# Patient Record
Sex: Male | Born: 1955
Health system: Southern US, Community
[De-identification: ages and names within clinical notes are randomized; demographics above are authoritative.]

## PROBLEM LIST (undated history)

## (undated) DIAGNOSIS — K219 Gastro-esophageal reflux disease without esophagitis: Secondary | ICD-10-CM

## (undated) DIAGNOSIS — I6930 Unspecified sequelae of cerebral infarction: Secondary | ICD-10-CM

## (undated) DIAGNOSIS — R0602 Shortness of breath: Secondary | ICD-10-CM

## (undated) DIAGNOSIS — I161 Hypertensive emergency: Secondary | ICD-10-CM

## (undated) DIAGNOSIS — I10 Essential (primary) hypertension: Secondary | ICD-10-CM

## (undated) DIAGNOSIS — N183 Chronic kidney disease, stage 3 (moderate): Secondary | ICD-10-CM

## (undated) DIAGNOSIS — I619 Nontraumatic intracerebral hemorrhage, unspecified: Secondary | ICD-10-CM

## (undated) DIAGNOSIS — E785 Hyperlipidemia, unspecified: Secondary | ICD-10-CM

## (undated) DIAGNOSIS — I639 Cerebral infarction, unspecified: Secondary | ICD-10-CM

## (undated) DIAGNOSIS — M109 Gout, unspecified: Secondary | ICD-10-CM

## (undated) DIAGNOSIS — J811 Chronic pulmonary edema: Secondary | ICD-10-CM

## (undated) DIAGNOSIS — R5381 Other malaise: Secondary | ICD-10-CM

## (undated) DIAGNOSIS — J9690 Respiratory failure, unspecified, unspecified whether with hypoxia or hypercapnia: Secondary | ICD-10-CM

## (undated) DIAGNOSIS — E119 Type 2 diabetes mellitus without complications: Secondary | ICD-10-CM

## (undated) DIAGNOSIS — M1990 Unspecified osteoarthritis, unspecified site: Secondary | ICD-10-CM

## (undated) DIAGNOSIS — I2510 Atherosclerotic heart disease of native coronary artery without angina pectoris: Secondary | ICD-10-CM

## (undated) DIAGNOSIS — I4891 Unspecified atrial fibrillation: Secondary | ICD-10-CM

## (undated) DIAGNOSIS — I509 Heart failure, unspecified: Secondary | ICD-10-CM

## (undated) HISTORY — DX: Gout, unspecified: M10.9

## (undated) HISTORY — DX: Unspecified sequelae of cerebral infarction: I69.30

## (undated) HISTORY — DX: Type 2 diabetes mellitus without complications: E11.9

## (undated) HISTORY — DX: Gastro-esophageal reflux disease without esophagitis: K21.9

## (undated) HISTORY — DX: Essential (primary) hypertension: I10

## (undated) HISTORY — DX: Chronic kidney disease, stage 3 (moderate): N18.3

---

## 1997-10-10 ENCOUNTER — Inpatient Hospital Stay (HOSPITAL_COMMUNITY): Admission: EM | Admit: 1997-10-10 | Discharge: 1997-10-12 | Payer: Self-pay | Admitting: Emergency Medicine

## 1997-10-10 ENCOUNTER — Encounter: Payer: Self-pay | Admitting: Emergency Medicine

## 1997-10-11 ENCOUNTER — Encounter: Payer: Self-pay | Admitting: Internal Medicine

## 2000-11-10 ENCOUNTER — Emergency Department (HOSPITAL_COMMUNITY): Admission: EM | Admit: 2000-11-10 | Discharge: 2000-11-10 | Payer: Self-pay | Admitting: Emergency Medicine

## 2003-05-08 ENCOUNTER — Inpatient Hospital Stay (HOSPITAL_COMMUNITY): Admission: EM | Admit: 2003-05-08 | Discharge: 2003-05-11 | Payer: Self-pay | Admitting: Emergency Medicine

## 2003-11-07 ENCOUNTER — Ambulatory Visit: Payer: Self-pay | Admitting: Internal Medicine

## 2003-11-08 ENCOUNTER — Ambulatory Visit: Payer: Self-pay | Admitting: *Deleted

## 2003-12-10 ENCOUNTER — Ambulatory Visit: Payer: Self-pay | Admitting: Internal Medicine

## 2005-01-18 ENCOUNTER — Emergency Department (HOSPITAL_COMMUNITY): Admission: EM | Admit: 2005-01-18 | Discharge: 2005-01-18 | Payer: Self-pay | Admitting: Emergency Medicine

## 2006-01-20 ENCOUNTER — Emergency Department (HOSPITAL_COMMUNITY): Admission: EM | Admit: 2006-01-20 | Discharge: 2006-01-21 | Payer: Self-pay | Admitting: Emergency Medicine

## 2006-12-23 ENCOUNTER — Emergency Department (HOSPITAL_COMMUNITY): Admission: EM | Admit: 2006-12-23 | Discharge: 2006-12-23 | Payer: Self-pay | Admitting: Emergency Medicine

## 2010-01-26 ENCOUNTER — Emergency Department (HOSPITAL_COMMUNITY)
Admission: EM | Admit: 2010-01-26 | Discharge: 2010-01-26 | Disposition: A | Discharge status: Other Acute Inpatient Hospital | Payer: Self-pay | Source: Home / Self Care | Admitting: Family Medicine

## 2010-01-26 ENCOUNTER — Inpatient Hospital Stay (HOSPITAL_COMMUNITY)
Admission: EM | Admit: 2010-01-26 | Discharge: 2010-02-03 | Payer: Self-pay | Source: Home / Self Care | Attending: Cardiology | Admitting: Cardiology

## 2010-01-27 ENCOUNTER — Encounter (INDEPENDENT_AMBULATORY_CARE_PROVIDER_SITE_OTHER): Payer: Self-pay | Admitting: Cardiology

## 2010-01-27 LAB — URINALYSIS, ROUTINE W REFLEX MICROSCOPIC
Bilirubin Urine: NEGATIVE
Ketones, ur: NEGATIVE mg/dL
Leukocytes, UA: NEGATIVE
Nitrite: NEGATIVE
Protein, ur: >300 mg/dL — AB
Specific Gravity, Urine: 1.019 (ref 1.005–1.030)
Urine Glucose, Fasting: 100 mg/dL — AB
Urobilinogen, UA: 1 mg/dL (ref 0.0–1.0)
pH: 6 (ref 5.0–8.0)

## 2010-01-27 LAB — CARDIAC PANEL(CRET KIN+CKTOT+MB+TROPI)
CK, MB: 2.7 ng/mL (ref 0.3–4.0)
Relative Index: 2.3 (ref 0.0–2.5)
Total CK: 117 U/L (ref 7–232)
Troponin I: 0.23 ng/mL — ABNORMAL HIGH (ref 0.00–0.06)

## 2010-01-27 LAB — COMPREHENSIVE METABOLIC PANEL
ALT: 33 U/L (ref 0–53)
AST: 32 U/L (ref 0–37)
Albumin: 3.6 g/dL (ref 3.5–5.2)
Alkaline Phosphatase: 68 U/L (ref 39–117)
BUN: 14 mg/dL (ref 6–23)
CO2: 28 mEq/L (ref 19–32)
Calcium: 9.2 mg/dL (ref 8.4–10.5)
Chloride: 96 mEq/L (ref 96–112)
Creatinine, Ser: 1.32 mg/dL (ref 0.4–1.5)
GFR calc Af Amer: >60 mL/min (ref >60)
GFR calc non Af Amer: 56 mL/min — ABNORMAL LOW (ref >60)
Glucose, Bld: 229 mg/dL — ABNORMAL HIGH (ref 70–99)
Potassium: 3 mEq/L — ABNORMAL LOW (ref 3.5–5.1)
Sodium: 136 mEq/L (ref 135–145)
Total Bilirubin: 0.7 mg/dL (ref 0.3–1.2)
Total Protein: 7.2 g/dL (ref 6.0–8.3)

## 2010-01-27 LAB — DIFFERENTIAL
Basophils Absolute: 0.1 10*3/uL (ref 0.0–0.1)
Basophils Relative: 1 % (ref 0–1)
Eosinophils Absolute: 0.2 10*3/uL (ref 0.0–0.7)
Eosinophils Relative: 2 % (ref 0–5)
Lymphocytes Relative: 36 % (ref 12–46)
Lymphs Abs: 2.6 10*3/uL (ref 0.7–4.0)
Monocytes Absolute: 0.6 10*3/uL (ref 0.1–1.0)
Monocytes Relative: 8 % (ref 3–12)
Neutro Abs: 3.8 10*3/uL (ref 1.7–7.7)
Neutrophils Relative %: 53 % (ref 43–77)

## 2010-01-27 LAB — CBC
HCT: 43.3 % (ref 39.0–52.0)
Hemoglobin: 14.6 g/dL (ref 13.0–17.0)
MCH: 29.7 pg (ref 26.0–34.0)
MCHC: 33.7 g/dL (ref 30.0–36.0)
MCV: 88 fL (ref 78.0–100.0)
Platelets: 316 10*3/uL (ref 150–400)
RBC: 4.92 MIL/uL (ref 4.22–5.81)
RDW: 13.8 % (ref 11.5–15.5)
WBC: 7.3 10*3/uL (ref 4.0–10.5)

## 2010-01-27 LAB — CK TOTAL AND CKMB (NOT AT ARMC)
CK, MB: 3.6 ng/mL (ref 0.3–4.0)
Relative Index: 2.5 (ref 0.0–2.5)
Total CK: 143 U/L (ref 7–232)

## 2010-01-27 LAB — BRAIN NATRIURETIC PEPTIDE: Pro B Natriuretic peptide (BNP): 149 pg/mL — ABNORMAL HIGH (ref 0.0–100.0)

## 2010-01-27 LAB — MRSA PCR SCREENING: MRSA by PCR: NEGATIVE

## 2010-01-27 LAB — HEPARIN LEVEL (UNFRACTIONATED): Heparin Unfractionated: <0.1 IU/mL — ABNORMAL LOW (ref 0.30–0.70)

## 2010-01-27 LAB — GLUCOSE, CAPILLARY: Glucose-Capillary: 300 mg/dL — ABNORMAL HIGH (ref 70–99)

## 2010-01-27 LAB — URINE MICROSCOPIC-ADD ON

## 2010-01-27 LAB — PROTIME-INR
INR: 0.99 (ref 0.00–1.49)
Prothrombin Time: 13.3 seconds (ref 11.6–15.2)

## 2010-01-27 LAB — TROPONIN I: Troponin I: 0.15 ng/mL — ABNORMAL HIGH (ref 0.00–0.06)

## 2010-01-29 LAB — BASIC METABOLIC PANEL
BUN: 26 mg/dL — ABNORMAL HIGH (ref 6–23)
CO2: 24 mEq/L (ref 19–32)
Calcium: 9.3 mg/dL (ref 8.4–10.5)
Chloride: 102 mEq/L (ref 96–112)
Creatinine, Ser: 1.92 mg/dL — ABNORMAL HIGH (ref 0.4–1.5)
GFR calc Af Amer: 44 mL/min — ABNORMAL LOW (ref >60)
GFR calc non Af Amer: 37 mL/min — ABNORMAL LOW (ref >60)
Glucose, Bld: 232 mg/dL — ABNORMAL HIGH (ref 70–99)
Potassium: 3.3 mEq/L — ABNORMAL LOW (ref 3.5–5.1)
Sodium: 137 mEq/L (ref 135–145)

## 2010-01-29 LAB — GLUCOSE, CAPILLARY
Glucose-Capillary: 140 mg/dL — ABNORMAL HIGH (ref 70–99)
Glucose-Capillary: 218 mg/dL — ABNORMAL HIGH (ref 70–99)
Glucose-Capillary: 275 mg/dL — ABNORMAL HIGH (ref 70–99)
Glucose-Capillary: 197 mg/dL — ABNORMAL HIGH (ref 70–99)
Glucose-Capillary: 196 mg/dL — ABNORMAL HIGH (ref 70–99)
Glucose-Capillary: 236 mg/dL — ABNORMAL HIGH (ref 70–99)
Glucose-Capillary: 232 mg/dL — ABNORMAL HIGH (ref 70–99)
Glucose-Capillary: 186 mg/dL — ABNORMAL HIGH (ref 70–99)

## 2010-01-29 LAB — LIPID PANEL
Cholesterol: 166 mg/dL (ref 0–200)
HDL: 49 mg/dL (ref >39)
LDL Cholesterol: 86 mg/dL (ref 0–99)
Total CHOL/HDL Ratio: 3.4 RATIO
Triglycerides: 156 mg/dL — ABNORMAL HIGH (ref <150)
VLDL: 31 mg/dL (ref 0–40)

## 2010-01-29 LAB — PROTIME-INR
INR: 1.12 (ref 0.00–1.49)
INR: 1.2 (ref 0.00–1.49)
Prothrombin Time: 14.6 seconds (ref 11.6–15.2)
Prothrombin Time: 15.4 seconds — ABNORMAL HIGH (ref 11.6–15.2)

## 2010-01-29 LAB — CBC
HCT: 36.6 % — ABNORMAL LOW (ref 39.0–52.0)
HCT: 34.2 % — ABNORMAL LOW (ref 39.0–52.0)
Hemoglobin: 11.9 g/dL — ABNORMAL LOW (ref 13.0–17.0)
Hemoglobin: 11.3 g/dL — ABNORMAL LOW (ref 13.0–17.0)
MCH: 28.5 pg (ref 26.0–34.0)
MCH: 28.8 pg (ref 26.0–34.0)
MCHC: 32.5 g/dL (ref 30.0–36.0)
MCHC: 33 g/dL (ref 30.0–36.0)
MCV: 87.6 fL (ref 78.0–100.0)
MCV: 87 fL (ref 78.0–100.0)
Platelets: 301 10*3/uL (ref 150–400)
Platelets: 288 10*3/uL (ref 150–400)
RBC: 4.18 MIL/uL — ABNORMAL LOW (ref 4.22–5.81)
RBC: 3.93 MIL/uL — ABNORMAL LOW (ref 4.22–5.81)
RDW: 14.1 % (ref 11.5–15.5)
RDW: 13.8 % (ref 11.5–15.5)
WBC: 8.1 10*3/uL (ref 4.0–10.5)
WBC: 9.5 10*3/uL (ref 4.0–10.5)

## 2010-01-29 LAB — CARDIAC PANEL(CRET KIN+CKTOT+MB+TROPI)
CK, MB: 2.3 ng/mL (ref 0.3–4.0)
CK, MB: 2.3 ng/mL (ref 0.3–4.0)
Relative Index: INVALID (ref 0.0–2.5)
Relative Index: 2.1 (ref 0.0–2.5)
Total CK: 95 U/L (ref 7–232)
Total CK: 109 U/L (ref 7–232)
Troponin I: 0.15 ng/mL — ABNORMAL HIGH (ref 0.00–0.06)
Troponin I: 0.09 ng/mL — ABNORMAL HIGH (ref 0.00–0.06)

## 2010-01-29 LAB — HEPARIN LEVEL (UNFRACTIONATED)
Heparin Unfractionated: 0.38 IU/mL (ref 0.30–0.70)
Heparin Unfractionated: 0.31 IU/mL (ref 0.30–0.70)

## 2010-01-29 LAB — BRAIN NATRIURETIC PEPTIDE: Pro B Natriuretic peptide (BNP): 89 pg/mL (ref 0.0–100.0)

## 2010-01-29 LAB — HEMOGLOBIN A1C
Hgb A1c MFr Bld: 8.9 % — ABNORMAL HIGH (ref <5.7)
Mean Plasma Glucose: 209 mg/dL — ABNORMAL HIGH (ref <117)

## 2010-01-29 LAB — APTT: aPTT: 52 seconds — ABNORMAL HIGH (ref 24–37)

## 2010-01-29 LAB — TSH: TSH: 2.879 u[IU]/mL (ref 0.350–4.500)

## 2010-02-03 LAB — BASIC METABOLIC PANEL
BUN: 29 mg/dL — ABNORMAL HIGH (ref 6–23)
CO2: 24 mEq/L (ref 19–32)
CO2: 26 mEq/L (ref 19–32)
Calcium: 9.4 mg/dL (ref 8.4–10.5)
Chloride: 101 mEq/L (ref 96–112)
Chloride: 104 mEq/L (ref 96–112)
Creatinine, Ser: 2.18 mg/dL — ABNORMAL HIGH (ref 0.4–1.5)
Creatinine, Ser: 1.58 mg/dL — ABNORMAL HIGH (ref 0.4–1.5)
GFR calc Af Amer: 38 mL/min — ABNORMAL LOW (ref >60)
GFR calc Af Amer: 55 mL/min — ABNORMAL LOW (ref >60)
GFR calc non Af Amer: 32 mL/min — ABNORMAL LOW (ref >60)
Glucose, Bld: 159 mg/dL — ABNORMAL HIGH (ref 70–99)
Potassium: 3.7 mEq/L (ref 3.5–5.1)
Potassium: 3.7 mEq/L (ref 3.5–5.1)
Sodium: 137 mEq/L (ref 135–145)

## 2010-02-03 LAB — CBC
HCT: 35.3 % — ABNORMAL LOW (ref 39.0–52.0)
HCT: 35.8 % — ABNORMAL LOW (ref 39.0–52.0)
Hemoglobin: 11.8 g/dL — ABNORMAL LOW (ref 13.0–17.0)
Hemoglobin: 11.8 g/dL — ABNORMAL LOW (ref 13.0–17.0)
Hemoglobin: 11.5 g/dL — ABNORMAL LOW (ref 13.0–17.0)
MCH: 29.2 pg (ref 26.0–34.0)
MCH: 28.7 pg (ref 26.0–34.0)
MCH: 29.5 pg (ref 26.0–34.0)
MCHC: 33.4 g/dL (ref 30.0–36.0)
MCHC: 33 g/dL (ref 30.0–36.0)
MCV: 87.4 fL (ref 78.0–100.0)
MCV: 87.1 fL (ref 78.0–100.0)
Platelets: 267 10*3/uL (ref 150–400)
Platelets: 259 10*3/uL (ref 150–400)
Platelets: 262 10*3/uL (ref 150–400)
RBC: 4.04 MIL/uL — ABNORMAL LOW (ref 4.22–5.81)
RBC: 4.11 MIL/uL — ABNORMAL LOW (ref 4.22–5.81)
RBC: 3.9 MIL/uL — ABNORMAL LOW (ref 4.22–5.81)
RDW: 14.1 % (ref 11.5–15.5)
RDW: 14.1 % (ref 11.5–15.5)
WBC: 8.9 10*3/uL (ref 4.0–10.5)
WBC: 7 10*3/uL (ref 4.0–10.5)
WBC: 5.8 10*3/uL (ref 4.0–10.5)

## 2010-02-03 LAB — GLUCOSE, CAPILLARY
Glucose-Capillary: 179 mg/dL — ABNORMAL HIGH (ref 70–99)
Glucose-Capillary: 165 mg/dL — ABNORMAL HIGH (ref 70–99)
Glucose-Capillary: 188 mg/dL — ABNORMAL HIGH (ref 70–99)
Glucose-Capillary: 214 mg/dL — ABNORMAL HIGH (ref 70–99)
Glucose-Capillary: 176 mg/dL — ABNORMAL HIGH (ref 70–99)
Glucose-Capillary: 212 mg/dL — ABNORMAL HIGH (ref 70–99)
Glucose-Capillary: 224 mg/dL — ABNORMAL HIGH (ref 70–99)
Glucose-Capillary: 156 mg/dL — ABNORMAL HIGH (ref 70–99)
Glucose-Capillary: 210 mg/dL — ABNORMAL HIGH (ref 70–99)

## 2010-02-03 LAB — BRAIN NATRIURETIC PEPTIDE: Pro B Natriuretic peptide (BNP): 363 pg/mL — ABNORMAL HIGH (ref 0.0–100.0)

## 2010-02-03 LAB — PROTIME-INR
INR: 1.3 (ref 0.00–1.49)
INR: 1.48 (ref 0.00–1.49)
INR: 1.88 — ABNORMAL HIGH (ref 0.00–1.49)
Prothrombin Time: 16.4 seconds — ABNORMAL HIGH (ref 11.6–15.2)
Prothrombin Time: 18.1 seconds — ABNORMAL HIGH (ref 11.6–15.2)
Prothrombin Time: 21.8 seconds — ABNORMAL HIGH (ref 11.6–15.2)

## 2010-02-03 LAB — HEPARIN LEVEL (UNFRACTIONATED)
Heparin Unfractionated: 0.2 IU/mL — ABNORMAL LOW (ref 0.30–0.70)
Heparin Unfractionated: 0.48 IU/mL (ref 0.30–0.70)
Heparin Unfractionated: 0.27 IU/mL — ABNORMAL LOW (ref 0.30–0.70)
Heparin Unfractionated: 0.38 IU/mL (ref 0.30–0.70)

## 2010-02-04 LAB — BASIC METABOLIC PANEL
BUN: 24 mg/dL — ABNORMAL HIGH (ref 6–23)
BUN: 21 mg/dL (ref 6–23)
CO2: 28 mEq/L (ref 19–32)
Calcium: 9.4 mg/dL (ref 8.4–10.5)
Calcium: 9.9 mg/dL (ref 8.4–10.5)
Chloride: 105 mEq/L (ref 96–112)
Chloride: 98 mEq/L (ref 96–112)
Creatinine, Ser: 1.57 mg/dL — ABNORMAL HIGH (ref 0.4–1.5)
GFR calc Af Amer: 56 mL/min — ABNORMAL LOW (ref >60)
GFR calc Af Amer: 54 mL/min — ABNORMAL LOW (ref >60)
GFR calc Af Amer: 51 mL/min — ABNORMAL LOW (ref >60)
GFR calc non Af Amer: 42 mL/min — ABNORMAL LOW (ref >60)
Potassium: 4.5 mEq/L (ref 3.5–5.1)
Sodium: 136 mEq/L (ref 135–145)

## 2010-02-04 LAB — GLUCOSE, CAPILLARY
Glucose-Capillary: 158 mg/dL — ABNORMAL HIGH (ref 70–99)
Glucose-Capillary: 259 mg/dL — ABNORMAL HIGH (ref 70–99)
Glucose-Capillary: 153 mg/dL — ABNORMAL HIGH (ref 70–99)
Glucose-Capillary: 194 mg/dL — ABNORMAL HIGH (ref 70–99)

## 2010-02-04 LAB — CBC
Hemoglobin: 12.1 g/dL — ABNORMAL LOW (ref 13.0–17.0)
MCH: 28.5 pg (ref 26.0–34.0)
MCV: 87 fL (ref 78.0–100.0)
MCV: 87.2 fL (ref 78.0–100.0)
Platelets: 278 10*3/uL (ref 150–400)
Platelets: 320 10*3/uL (ref 150–400)
Platelets: 339 10*3/uL (ref 150–400)
RBC: 4 MIL/uL — ABNORMAL LOW (ref 4.22–5.81)
RBC: 4.24 MIL/uL (ref 4.22–5.81)
RBC: 4.44 MIL/uL (ref 4.22–5.81)
RDW: 14 % (ref 11.5–15.5)
WBC: 6 10*3/uL (ref 4.0–10.5)
WBC: 6.4 10*3/uL (ref 4.0–10.5)
WBC: 5.5 10*3/uL (ref 4.0–10.5)

## 2010-02-04 LAB — PROTIME-INR
INR: 2.11 — ABNORMAL HIGH (ref 0.00–1.49)
Prothrombin Time: 23.8 seconds — ABNORMAL HIGH (ref 11.6–15.2)
Prothrombin Time: 29.9 seconds — ABNORMAL HIGH (ref 11.6–15.2)

## 2010-02-04 LAB — BRAIN NATRIURETIC PEPTIDE: Pro B Natriuretic peptide (BNP): 363 pg/mL — ABNORMAL HIGH (ref 0.0–100.0)

## 2010-02-04 LAB — HEPARIN LEVEL (UNFRACTIONATED)
Heparin Unfractionated: 0.26 IU/mL — ABNORMAL LOW (ref 0.30–0.70)
Heparin Unfractionated: 0.18 IU/mL — ABNORMAL LOW (ref 0.30–0.70)

## 2010-02-26 NOTE — Discharge Summary (Signed)
NAMEWILLIOM, [name]                ACCOUNT NO.:  [id number]  MEDICAL RECORD NO.:  [id number]          PATIENT TYPE:  INP  LOCATION:  2004                         FACILITY:  [facility]  PHYSICIAN:  Eduardo Osier. [name], M.D. DATE OF BIRTH:  Nov 24, 1955  DATE OF ADMISSION:  01/26/2010 DATE OF DISCHARGE:  02/03/2010                              DISCHARGE SUMMARY   ADMITTING DIAGNOSES: 1. Chest pain, rule out myocardial infarction. 2. New onset atrial fibrillation with rapid ventricular response. 3. Decompensated systolic heart failure. 4. Hypertensive emergency. 5. Noninsulin-dependent diabetes mellitus. 6. Hypokalemia. 7. Alcohol abuse.  FINAL DIAGNOSES: 1. Compensated systolic heart failure. 2. Status post hypertensive emergency. 3. Hypertensive heart disease with systolic dysfunction. 4. Chronic atrial fibrillation. 5. Hypertension. 6. Diabetes mellitus. 7. EtOH abuse. 8. Status post hypokalemia. 9. Mild renal insufficiency.  DISCHARGE MEDICATIONS: 1. Enteric-coated aspirin 81 mg 1 tablet daily. 2. Carvedilol 25 mg 1 tablet twice daily. 3. BiDil 1 tablet 3 times daily. 4. Ramipril 5 mg 1 tablet twice daily. 5. Aldactone 50 mg 1 tablet twice daily. 6. Clonidine 0.1 mg 1 tablet twice daily. 7. Amlodipine 5 mg 1 tablet daily. 8. Atorvastatin 20 mg 1 tablet daily. 9. Multivitamin 1 tablet daily as before. 10.Amaryl 2 mg 1 tablet twice daily. 11.Coumadin 6 mg 1 tablet every evening.  DIET:  Low salt, low cholesterol, 1800 calories ADA diet.  The patient has been advised to monitor blood pressure and blood sugar daily.  We will check BNP and PT/INR in 1 week.  The patient has been given instructions for CHF.  Follow up with me in 1 week.  CONDITION AT DISCHARGE:  Stable.  BRIEF HISTORY AND HOSPITAL COURSE:  [name] is a 55 year old black male with past medical history significant for hypertension, noninsulin- dependent diabetes mellitus, alcohol abuse, and history of  tobacco abuse.  He came to the ER via EMS from urgent care because of progressive increasing shortness of breath associated with chest heaviness and leg swelling for 1 week.  Denies any nausea, vomiting, diaphoresis.  Denies palpitation, lightheadedness, or syncope.  The patient states he is noncompliant to medication and has been drinking 4 ounces of hard liquor three to four times per week and occasionally beer.  The patient was noted to have blood pressure of 204/144 with AFib with RVR, and was noted to be in congestive heart failure and was transferred from urgent care to [facility] for further management.  The patient denies any thyroid problems.  PAST MEDICAL HISTORY:  As above.  PAST SURGICAL HISTORY:  He had appendectomy in the past.  ALLERGIES:  No known drug allergies.  MEDICATION AT HOME:  He was on Exforge, clonidine, Lasix, and metformin.  SOCIAL HISTORY:  He is single.  No children.  Smoked one-pack per week for 15 years.  Quit more than 7 years ago.  Drinks hard liquor 4 ounces three to four times per week for 20+ years and beer three to four times per week.  Worked in [employer] in the past, presently unemployed.  FAMILY HISTORY:  Father died of MI.  Mother died of stroke.  She was also hypertensive.  He has seven brothers and two sisters.  Most of the brothers are diabetic and hypertensive.  PHYSICAL EXAMINATION:  GENERAL:  He was alert, awake, and oriented x3 in no acute distress. VITAL SIGNS:  Blood pressure was 180/111, pulse was 113 irregularly irregular. HEENT:  Conjunctivae was pink. NECK:  Supple.  Positive JVD.  He had bibasilar rales. CARDIOVASCULAR:  Irregularly irregular.  S1 and S2 was normal.  There was soft systolic murmur and S3 gallop. ABDOMEN:  Soft.  Bowel sounds were present, nontender. EXTREMITIES:  There is no clubbing or cyanosis.  There was 2+ edema.  LABORATORY DATA:  His hemoglobin was 14.6, hematocrit 43.3, white count of 7.3.   Sodium was 136, potassium 3.0, BUN 14, creatinine 1.32, glucose 229.  His BNP was 149.  CK was 117, MB 2.7, troponin I was 0.23, repeat troponin I was 0.15.  His hemoglobin A1c was 8.9.  TSH was 2.87, BNP on January 28, 2010 was 89.  PT is 29.9 and INR of 2.84.  His sodium is 136, potassium 4.0, BUN 27, creatinine 1.70, glucose is 217.  Two-D echo done on January 27, 2010 showed mild concentric hypertrophy, systolic function was severely reduced with EF of 25-30%.  There was mild AI and MR and moderate tricuspid regurgitation.  Chest x-ray done showed congestive heart failure with pulmonary edema.  Repeat x-ray showed cardiomegaly, persistent mild interstitial edema without change in aeration.  BRIEF HOSPITAL COURSE:  The patient was admitted to Step-Down Unit.  The patient was started on IV Lasix and heparin and nitrates with good diuresis.  Discussed at length with the patient regarding TEE-assisted cardioversion versus medical treatment and also regarding stress test, the patient wanted to be treated medically only and refused for TEE. The patient was started also on Coumadin per pharmacy protocol.  The patient did not have any episodes of chest pain during the hospital stay.  His INR is in the therapeutic range.  His heart rate is fairly well controlled.  The patient has been advised to take his medication regularly and if he gains 2-3 pounds in 1 day or 2 pounds overnight or experiences progressive shortness of breath or swelling in feet or hands, he should call my office immediately.  The patient will be monitored in my office in 1 week.  We will check his PT/INR in 1 week.     Eduardo Osier. [name], M.D.     MNH/MEDQ  D:  02/03/2010  T:  02/04/2010  Job:  [id number]  Electronically Signed by Rinaldo Cloud M.D. on 02/26/2010 10:42:58 PM

## 2010-05-30 NOTE — Discharge Summary (Signed)
NAME:  [name], [name] NO.:  [id number]   MEDICAL RECORD NO.:  [id number]                   PATIENT TYPE:  INP   LOCATION:  1610                                 FACILITY[facility]  [facility]   PHYSICIAN:  Sherin Quarry, MD                   DATE OF BIRTH:  1955/02/03   DATE OF ADMISSION:  05/08/2003  DATE OF DISCHARGE:                                 DISCHARGE SUMMARY   HISTORY:  [name] is a 55 year old man who is employed at [employer].  [name] indicated that he had a long standing history of  hypertension and that he had been unable to obtain any medications for the  last six months as he had been unemployed until recently.  He presented on  April 26 with acute onset of headache with associated dizziness and nausea  and vomiting.   PHYSICAL EXAMINATION:  VITAL SIGNS:  On presentation, he was seen by Dr.  Suanne Marker who noted that his blood pressure was 220/120, pulse 71, respirations  18, temperature 98.3, O2 saturation was 100%.  HEENT:  Within normal limits.  CHEST:  Clear to auscultation.  There was no wheezing or rhonchi.  CARDIOVASCULAR:  Revealed normal S1 and S2 without murmurs, rubs or gallops.  ABDOMEN:  Within normal limits.  There were no masses or tenderness, no  guarding or rebound.  NEUROLOGIC:  Within normal limits.  EXTREMITIES:  Revealed no evidence of cyanosis or edema.   A CT scan of the brain was obtained which was negative for infarct or  hemorrhage.  Relevant laboratories studies obtained included a sodium of  138, potassium 2.7, chloride 105, bicarbonate 26, creatinine 1.4, BUN 18.  The initial glucose was 225, this came down to the normal range during the  course of the patient's hospitalization.  CBC revealed a white count of  7300, hemoglobin 14.6.  Serial cardiac enzymes were negative.  A lipid  profile was obtained which showed an LDL cholesterol of 71, HDL of 84.  On  admission, the patient received two  injections of IV labetalol and then was  placed on a labetalol infusion at 2 mg per minute.  Subsequently, he was  started on labetalol 200 mg b.i.d., lisinopril 10 mg b.i.d. and Norvasc 10  mg daily.  On this regimen, blood pressure slowly improved, the labetalol  infusion was discontinued.  Labetalol was subsequently increased to 400 mg  b.i.d.  By April 29, the patient was alert, he was no longer having headache  or dizziness, blood pressure was still in the range of 170/100 to 110;  however, it was felt reasonable to discharge the patient at that time.   DISCHARGE DIAGNOSES:  1. Hypertensive urgency secondary to noncompliance with medications.  2. Hypokalemia.  3. History of alcohol abuse.   DISCHARGE MEDICATIONS:  1. Zestril 10 mg b.i.d.  2. Norvasc 10 mg  daily.  3. Labetalol 400 mg b.i.d.  4. The patient was also advised to take aspirin 81 mg daily.   I emphasized to the patient the importance of taking his medications on a  regular basis.  I also emphasized that control of his blood pressure would  require frequent visits with a physician to adjust his medications.  He is  going to plan to followup with Leanne Chang, M.D. and I advised him to  return to his office in about seven days.   CONDITION ON DISCHARGE:  Good.                                               Sherin Quarry, MD    SY/MEDQ  D:  05/11/2003  T:  05/11/2003  Job:  [id number]   cc:   Leanne Chang, M.D.  [address]  [place]  [place] [address]  Fax: [phone]

## 2010-11-19 ENCOUNTER — Inpatient Hospital Stay (HOSPITAL_COMMUNITY)
Admission: EM | Admit: 2010-11-19 | Discharge: 2010-12-22 | DRG: 003 | Disposition: A | Discharge status: Rehabilitation Facility | Payer: 59 | Source: Ambulatory Visit | Attending: Internal Medicine | Admitting: Internal Medicine

## 2010-11-19 ENCOUNTER — Emergency Department (HOSPITAL_COMMUNITY): Payer: 59

## 2010-11-19 ENCOUNTER — Encounter: Payer: Self-pay | Admitting: *Deleted

## 2010-11-19 ENCOUNTER — Other Ambulatory Visit: Payer: Self-pay

## 2010-11-19 DIAGNOSIS — I2510 Atherosclerotic heart disease of native coronary artery without angina pectoris: Secondary | ICD-10-CM | POA: Diagnosis present

## 2010-11-19 DIAGNOSIS — G9340 Encephalopathy, unspecified: Secondary | ICD-10-CM | POA: Diagnosis present

## 2010-11-19 DIAGNOSIS — Y833 Surgical operation with formation of external stoma as the cause of abnormal reaction of the patient, or of later complication, without mention of misadventure at the time of the procedure: Secondary | ICD-10-CM | POA: Diagnosis not present

## 2010-11-19 DIAGNOSIS — J9600 Acute respiratory failure, unspecified whether with hypoxia or hypercapnia: Secondary | ICD-10-CM | POA: Diagnosis not present

## 2010-11-19 DIAGNOSIS — I5023 Acute on chronic systolic (congestive) heart failure: Secondary | ICD-10-CM | POA: Diagnosis present

## 2010-11-19 DIAGNOSIS — J189 Pneumonia, unspecified organism: Secondary | ICD-10-CM | POA: Diagnosis not present

## 2010-11-19 DIAGNOSIS — G911 Obstructive hydrocephalus: Secondary | ICD-10-CM | POA: Diagnosis present

## 2010-11-19 DIAGNOSIS — E785 Hyperlipidemia, unspecified: Secondary | ICD-10-CM | POA: Diagnosis present

## 2010-11-19 DIAGNOSIS — I10 Essential (primary) hypertension: Secondary | ICD-10-CM

## 2010-11-19 DIAGNOSIS — E069 Thyroiditis, unspecified: Secondary | ICD-10-CM | POA: Diagnosis present

## 2010-11-19 DIAGNOSIS — R0902 Hypoxemia: Secondary | ICD-10-CM

## 2010-11-19 DIAGNOSIS — I4821 Permanent atrial fibrillation: Secondary | ICD-10-CM | POA: Diagnosis present

## 2010-11-19 DIAGNOSIS — G450 Vertebro-basilar artery syndrome: Secondary | ICD-10-CM | POA: Diagnosis present

## 2010-11-19 DIAGNOSIS — I619 Nontraumatic intracerebral hemorrhage, unspecified: Secondary | ICD-10-CM | POA: Diagnosis not present

## 2010-11-19 DIAGNOSIS — J9690 Respiratory failure, unspecified, unspecified whether with hypoxia or hypercapnia: Secondary | ICD-10-CM

## 2010-11-19 DIAGNOSIS — R4789 Other speech disturbances: Secondary | ICD-10-CM | POA: Diagnosis present

## 2010-11-19 DIAGNOSIS — Z8673 Personal history of transient ischemic attack (TIA), and cerebral infarction without residual deficits: Secondary | ICD-10-CM | POA: Diagnosis present

## 2010-11-19 DIAGNOSIS — J811 Chronic pulmonary edema: Secondary | ICD-10-CM

## 2010-11-19 DIAGNOSIS — G459 Transient cerebral ischemic attack, unspecified: Secondary | ICD-10-CM

## 2010-11-19 DIAGNOSIS — Y9210 Unspecified residential institution as the place of occurrence of the external cause: Secondary | ICD-10-CM | POA: Diagnosis not present

## 2010-11-19 DIAGNOSIS — E119 Type 2 diabetes mellitus without complications: Secondary | ICD-10-CM

## 2010-11-19 DIAGNOSIS — R4020 Unspecified coma: Secondary | ICD-10-CM | POA: Diagnosis present

## 2010-11-19 DIAGNOSIS — K921 Melena: Secondary | ICD-10-CM | POA: Diagnosis not present

## 2010-11-19 DIAGNOSIS — Z87891 Personal history of nicotine dependence: Secondary | ICD-10-CM

## 2010-11-19 DIAGNOSIS — I110 Hypertensive heart disease with heart failure: Secondary | ICD-10-CM | POA: Diagnosis present

## 2010-11-19 DIAGNOSIS — E876 Hypokalemia: Secondary | ICD-10-CM | POA: Diagnosis not present

## 2010-11-19 DIAGNOSIS — I6340 Cerebral infarction due to embolism of unspecified cerebral artery: Principal | ICD-10-CM | POA: Diagnosis present

## 2010-11-19 DIAGNOSIS — J9509 Other tracheostomy complication: Secondary | ICD-10-CM | POA: Diagnosis not present

## 2010-11-19 DIAGNOSIS — I509 Heart failure, unspecified: Secondary | ICD-10-CM

## 2010-11-19 DIAGNOSIS — I161 Hypertensive emergency: Secondary | ICD-10-CM

## 2010-11-19 DIAGNOSIS — I639 Cerebral infarction, unspecified: Secondary | ICD-10-CM

## 2010-11-19 DIAGNOSIS — E870 Hyperosmolality and hypernatremia: Secondary | ICD-10-CM | POA: Diagnosis present

## 2010-11-19 DIAGNOSIS — N179 Acute kidney failure, unspecified: Secondary | ICD-10-CM | POA: Diagnosis present

## 2010-11-19 DIAGNOSIS — M109 Gout, unspecified: Secondary | ICD-10-CM | POA: Diagnosis present

## 2010-11-19 DIAGNOSIS — I4891 Unspecified atrial fibrillation: Secondary | ICD-10-CM

## 2010-11-19 HISTORY — DX: Personal history of transient ischemic attack (TIA), and cerebral infarction without residual deficits: Z86.73

## 2010-11-19 HISTORY — DX: Unspecified atrial fibrillation: I48.91

## 2010-11-19 HISTORY — DX: Respiratory failure, unspecified, unspecified whether with hypoxia or hypercapnia: J96.90

## 2010-11-19 HISTORY — DX: Chronic pulmonary edema: J81.1

## 2010-11-19 HISTORY — DX: Essential (primary) hypertension: I10

## 2010-11-19 HISTORY — DX: Nontraumatic intracerebral hemorrhage, unspecified: I61.9

## 2010-11-19 HISTORY — DX: Heart failure, unspecified: I50.9

## 2010-11-19 HISTORY — DX: Cerebral infarction, unspecified: I63.9

## 2010-11-19 HISTORY — DX: Type 2 diabetes mellitus without complications: E11.9

## 2010-11-19 HISTORY — DX: Hyperlipidemia, unspecified: E78.5

## 2010-11-19 HISTORY — DX: Hypertensive emergency: I16.1

## 2010-11-19 HISTORY — DX: Other malaise: R53.81

## 2010-11-19 HISTORY — DX: Atherosclerotic heart disease of native coronary artery without angina pectoris: I25.10

## 2010-11-19 HISTORY — DX: Unspecified osteoarthritis, unspecified site: M19.90

## 2010-11-19 LAB — DIFFERENTIAL
Basophils Absolute: 0 10*3/uL (ref 0.0–0.1)
Basophils Relative: 0 % (ref 0–1)
Neutro Abs: 3 10*3/uL (ref 1.7–7.7)
Neutrophils Relative %: 34 % — ABNORMAL LOW (ref 43–77)

## 2010-11-19 LAB — BLOOD GAS, ARTERIAL
Acid-base deficit: 1.8 mmol/L (ref 0.0–2.0)
Bicarbonate: 22.4 mEq/L (ref 20.0–24.0)
O2 Saturation: 97 %
PEEP: 5 cmH2O
Patient temperature: 98.4

## 2010-11-19 LAB — POCT I-STAT, CHEM 8
BUN: 28 mg/dL — ABNORMAL HIGH (ref 6–23)
Calcium, Ion: 1.21 mmol/L (ref 1.12–1.32)
Creatinine, Ser: 1.5 mg/dL — ABNORMAL HIGH (ref 0.50–1.35)
Glucose, Bld: 221 mg/dL — ABNORMAL HIGH (ref 70–99)
Sodium: 136 mEq/L (ref 135–145)
TCO2: 23 mmol/L (ref 0–100)

## 2010-11-19 LAB — COMPREHENSIVE METABOLIC PANEL
AST: 32 U/L (ref 0–37)
Albumin: 3.4 g/dL — ABNORMAL LOW (ref 3.5–5.2)
Chloride: 103 mEq/L (ref 96–112)
Creatinine, Ser: 1.42 mg/dL — ABNORMAL HIGH (ref 0.50–1.35)
Potassium: 5.5 mEq/L — ABNORMAL HIGH (ref 3.5–5.1)
Total Bilirubin: 0.4 mg/dL (ref 0.3–1.2)

## 2010-11-19 LAB — TROPONIN I: Troponin I: <0.3 ng/mL (ref <0.30)

## 2010-11-19 LAB — CBC
MCHC: 33.8 g/dL (ref 30.0–36.0)
RDW: 15.1 % (ref 11.5–15.5)

## 2010-11-19 LAB — ETHANOL: Alcohol, Ethyl (B): <11 mg/dL (ref 0–11)

## 2010-11-19 LAB — CK TOTAL AND CKMB (NOT AT ARMC): CK, MB: 2.5 ng/mL (ref 0.3–4.0)

## 2010-11-19 MED ORDER — PROPOFOL 10 MG/ML IV EMUL
INTRAVENOUS | Status: AC | PRN
  Administered 2010-11-19: 5 mg via INTRAVENOUS

## 2010-11-19 MED ORDER — ISOSORB DINITRATE-HYDRALAZINE 20-37.5 MG PO TABS
1.0000 | ORAL_TABLET | Freq: Three times a day (TID) | ORAL | Status: DC
  Administered 2010-11-19 – 2010-12-06 (×46): 1 via ORAL
  Filled 2010-11-19 (×54): qty 1

## 2010-11-19 MED ORDER — RAMIPRIL 10 MG PO TABS
10.0000 mg | ORAL_TABLET | Freq: Every day | ORAL | Status: DC
  Administered 2010-11-19 – 2010-11-20 (×2): 10 mg via ORAL
  Filled 2010-11-19 (×4): qty 1

## 2010-11-19 MED ORDER — ETOMIDATE 2 MG/ML IV SOLN
INTRAVENOUS | Status: AC | PRN
  Administered 2010-11-19: 10 mg via INTRAVENOUS

## 2010-11-19 MED ORDER — DEXTROSE 10 % IV SOLN: INTRAVENOUS | Status: DC

## 2010-11-19 MED ORDER — PROPOFOL 10 MG/ML IV EMUL
5.0000 ug/kg/min | INTRAVENOUS | Status: DC
  Administered 2010-11-19: 5 ug/kg/min via INTRAVENOUS
  Filled 2010-11-19: qty 20

## 2010-11-19 MED ORDER — CLONIDINE HCL 0.1 MG PO TABS
0.1000 mg | ORAL_TABLET | Freq: Two times a day (BID) | ORAL | Status: DC
  Administered 2010-11-19 – 2010-11-26 (×11): 0.1 mg via ORAL
  Filled 2010-11-19 (×15): qty 1

## 2010-11-19 MED ORDER — LIDOCAINE HCL (CARDIAC) 20 MG/ML IV SOLN
INTRAVENOUS | Status: AC
  Filled 2010-11-19: qty 5

## 2010-11-19 MED ORDER — SUCCINYLCHOLINE CHLORIDE 20 MG/ML IJ SOLN
INTRAMUSCULAR | Status: AC
  Administered 2010-11-19: 100 mg via INTRAVENOUS
  Filled 2010-11-19: qty 5

## 2010-11-19 MED ORDER — FUROSEMIDE 10 MG/ML IJ SOLN
40.0000 mg | Freq: Three times a day (TID) | INTRAMUSCULAR | Status: DC
  Administered 2010-11-19 – 2010-11-20 (×2): 40 mg via INTRAVENOUS
  Filled 2010-11-19 (×3): qty 4

## 2010-11-19 MED ORDER — ETOMIDATE 2 MG/ML IV SOLN
INTRAVENOUS | Status: AC
  Administered 2010-11-19: 30 mg via INTRAVENOUS
  Filled 2010-11-19: qty 20

## 2010-11-19 MED ORDER — PROPOFOL 10 MG/ML IV EMUL
5.0000 ug/kg/min | INTRAVENOUS | Status: DC
  Administered 2010-11-20 (×2): 20 ug/kg/min via INTRAVENOUS
  Filled 2010-11-19 (×3): qty 100

## 2010-11-19 MED ORDER — SODIUM CHLORIDE 0.9 % IV SOLN
250.0000 mL | INTRAVENOUS | Status: DC | PRN
  Administered 2010-11-24 – 2010-11-30 (×7): 250 mL via INTRAVENOUS

## 2010-11-19 MED ORDER — PROPOFOL 10 MG/ML IV EMUL
INTRAVENOUS | Status: AC
  Filled 2010-11-19: qty 20

## 2010-11-19 MED ORDER — ROCURONIUM BROMIDE 50 MG/5ML IV SOLN
INTRAVENOUS | Status: AC
  Filled 2010-11-19: qty 2

## 2010-11-19 MED ORDER — SODIUM CHLORIDE 0.9 % IV SOLN
Freq: Once | INTRAVENOUS | Status: AC
  Administered 2010-11-19: 18:00:00 via INTRAVENOUS

## 2010-11-19 MED ORDER — IOHEXOL 350 MG/ML SOLN
50.0000 mL | Freq: Once | INTRAVENOUS | Status: AC | PRN
  Administered 2010-11-19: 50 mL via INTRAVENOUS

## 2010-11-19 MED ORDER — INSULIN ASPART 100 UNIT/ML ~~LOC~~ SOLN
0.0000 [IU] | SUBCUTANEOUS | Status: DC
  Administered 2010-11-20 (×4): 1 [IU] via SUBCUTANEOUS
  Administered 2010-11-20: 4 [IU] via SUBCUTANEOUS
  Administered 2010-11-21 (×2): 1 [IU] via SUBCUTANEOUS
  Administered 2010-11-21: 3 [IU] via SUBCUTANEOUS
  Administered 2010-11-21: 1 [IU] via SUBCUTANEOUS
  Filled 2010-11-19 (×2): qty 3

## 2010-11-19 MED ORDER — FAMOTIDINE IN NACL 20-0.9 MG/50ML-% IV SOLN
20.0000 mg | Freq: Two times a day (BID) | INTRAVENOUS | Status: DC
  Administered 2010-11-19 – 2010-11-27 (×16): 20 mg via INTRAVENOUS
  Filled 2010-11-19 (×17): qty 50

## 2010-11-19 MED ORDER — HEPARIN SODIUM (PORCINE) 5000 UNIT/ML IJ SOLN
5000.0000 [IU] | Freq: Three times a day (TID) | INTRAMUSCULAR | Status: DC
  Filled 2010-11-19 (×2): qty 1

## 2010-11-19 MED ORDER — HEPARIN (PORCINE) IN NACL 100-0.45 UNIT/ML-% IJ SOLN
12.0000 [IU]/kg/h | INTRAMUSCULAR | Status: DC
  Administered 2010-11-19: 12 [IU]/kg/h via INTRAVENOUS
  Filled 2010-11-19 (×2): qty 250

## 2010-11-19 MED ORDER — SPIRONOLACTONE 50 MG PO TABS
50.0000 mg | ORAL_TABLET | Freq: Every day | ORAL | Status: DC
  Administered 2010-11-19 – 2010-11-24 (×4): 50 mg via ORAL
  Filled 2010-11-19 (×7): qty 1

## 2010-11-19 MED ORDER — CARVEDILOL 25 MG PO TABS
25.0000 mg | ORAL_TABLET | Freq: Two times a day (BID) | ORAL | Status: DC
  Administered 2010-11-19 – 2010-12-07 (×34): 25 mg via ORAL
  Filled 2010-11-19 (×41): qty 1

## 2010-11-19 MED ORDER — AMLODIPINE BESYLATE 5 MG PO TABS
5.0000 mg | ORAL_TABLET | Freq: Every day | ORAL | Status: DC
  Administered 2010-11-19 – 2010-11-27 (×7): 5 mg via ORAL
  Filled 2010-11-19 (×9): qty 1

## 2010-11-19 MED ORDER — FENTANYL CITRATE 0.05 MG/ML IJ SOLN: 50.0000 ug | INTRAMUSCULAR | Status: DC | PRN

## 2010-11-19 MED ORDER — FENTANYL CITRATE 0.05 MG/ML IJ SOLN: 100.0000 ug | Freq: Once | INTRAMUSCULAR | Status: DC

## 2010-11-19 MED ORDER — SODIUM CHLORIDE 0.9 % IV SOLN
INTRAVENOUS | Status: DC
  Administered 2010-11-20: via INTRAVENOUS

## 2010-11-19 NOTE — ED Notes (Signed)
MD at bedside. Intubating pt.

## 2010-11-19 NOTE — ED Notes (Signed)
Patient is resting comfortably. Pt now able to move LUE, no drift

## 2010-11-19 NOTE — ED Notes (Signed)
Pt c/o sudden onset dizziness & h/a then collapsed. Upon EMS - pt with snoringg respirations, unresponsive. Enroute speech garbled, flaccid left side

## 2010-11-19 NOTE — Progress Notes (Signed)
RT titrated FiO2 to 60% per Pt maintaining sats of 100%. RT will continue to monitor.

## 2010-11-19 NOTE — Progress Notes (Signed)
  Addendum to progress note:  Discussed risks and benefits of heparin gtt with pharmacy and neurology.  Given that his stroke symptoms resolved spontaneously without intervention within 24 hours, he has not had a stroke.  We discussed secondary stroke prevention for a-fib (currently sub-therapeutic INR) and agreed to fully anticoagulate with heparin while awaiting warfarin to become therapeutic. Will restart warfarin in AM if extubated.

## 2010-11-19 NOTE — ED Notes (Signed)
MD at bedside. Dr. Kendrick Fries

## 2010-11-19 NOTE — ED Provider Notes (Signed)
History     CSN: [id number] Arrival date & time: 11/19/2010  5:02 PM   None     No chief complaint on file.   (Consider location/radiation/quality/duration/timing/severity/associated sxs/prior treatment) Patient is a 55 y.o. male presenting with Acute Neurological Problem and neurologic complaint. The history is provided by the EMS personnel and the patient. The history is limited by the condition of the patient.  Cerebrovascular Accident This is a new problem. The current episode started today. The problem occurs constantly. The problem has been unchanged. Associated symptoms include chest pain, diaphoresis and headaches. Pertinent negatives include no abdominal pain, coughing, fever, nausea or vomiting. The symptoms are aggravated by nothing. He has tried nothing for the symptoms. The treatment provided no relief.  Neurologic Problem The primary symptoms include headaches, loss of consciousness, altered mental status, focal weakness and speech change. Primary symptoms do not include fever, nausea or vomiting. The symptoms began less than 1 hour ago. The symptoms are unchanged. The neurological symptoms are focal. Context: while talking on the phone.  The headache began today. The headache developed suddenly. The pain from the headache is at a severity of 10/10.  Loss of consciousness began less than 1 hour ago. Length of episode of loss of consciousness: unsure. The loss of consciousness was not witnessed (pt was talking on phone).  Weakness began less than 1 hour ago. The weakness is unchanged. There is no ability to contract the muscle with maximum physical effort.  There is weakness in these regions/motions: left forearm extension, left finger extension, left thumb abduction, left shoulder extension, left bicep flexion, left tricep extension and left forearm flexion. There is impairment of the following actions: articulating words and shrugging shoulders.  Change in speech began less than 1  hour ago. The speech change is unchanged. Description of speech change: slurred speech.    No past medical history on file.  No past surgical history on file.  No family history on file.  History  Substance Use Topics  . Smoking status: Not on file  . Smokeless tobacco: Not on file  . Alcohol Use: Not on file      Review of Systems  Constitutional: Positive for diaphoresis. Negative for fever.  Respiratory: Negative for cough and shortness of breath.   Cardiovascular: Positive for chest pain.  Gastrointestinal: Negative for nausea, vomiting, abdominal pain and diarrhea.  Neurological: Positive for speech change, focal weakness, loss of consciousness and headaches.  Psychiatric/Behavioral: Positive for altered mental status.  All other systems reviewed and are negative.    Allergies  Review of patient's allergies indicates not on file.  Home Medications  No current outpatient prescriptions on file.  There were no vitals taken for this visit.  Physical Exam  Nursing note and vitals reviewed. Constitutional: He is oriented to person, place, and time. He appears well-developed and well-nourished. He appears distressed.  HENT:  Head: Normocephalic and atraumatic.  Eyes: Pupils are equal, round, and reactive to light.  Cardiovascular: Normal rate and normal heart sounds.   Pulmonary/Chest: Breath sounds normal. He is in respiratory distress. He has no wheezes. He exhibits tenderness.  Abdominal: Soft. He exhibits no distension. There is no tenderness.  Musculoskeletal: Normal range of motion.  Neurological: He is alert and oriented to person, place, and time. A sensory deficit is present. No cranial nerve deficit. He exhibits abnormal muscle tone. GCS eye subscore is 4. GCS verbal subscore is 5. GCS motor subscore is 6.       Unable  to move left arm; barely able to move left leg  Skin: Skin is warm and dry.  Psychiatric: He has a normal mood and affect.    ED Course    INTUBATION Performed by: Daleen Bo Authorized by: Daleen Bo Consent: Verbal consent obtained. Written consent not obtained. The procedure was performed in an emergent situation. Consent given by: patient Indications: respiratory distress Intubation method: video-assisted Patient status: paralyzed (RSI) Preoxygenation: BVM and nonrebreather mask Sedatives: etomidate Paralytic: succinylcholine Tube size: 7.5 mm Tube type: uncuffed Number of attempts: 3 Ventilation between attempts: BVM Cricoid pressure: yes Cords visualized: yes Post-procedure assessment: chest rise and CO2 detector Breath sounds: equal Cuff inflated: yes Chest x-ray interpreted by other physician. Chest x-ray findings: endotracheal tube in appropriate position Patient tolerance: Patient tolerated the procedure well with no immediate complications.   (including critical care time)  Labs Reviewed  DIFFERENTIAL - Abnormal; Notable for the following:    Neutrophils Relative 34 (*)    Lymphocytes Relative 56 (*)    Lymphs Abs 5.0 (*)    All other components within normal limits  COMPREHENSIVE METABOLIC PANEL - Abnormal; Notable for the following:    Potassium 5.5 (*) HEMOLYSIS AT THIS LEVEL MAY AFFECT RESULT   CO2 15 (*)    Glucose, Bld 211 (*)    BUN 25 (*)    Creatinine, Ser 1.42 (*)    Albumin 3.4 (*)    GFR calc non Af Amer 54 (*)    GFR calc Af Amer 62 (*)    All other components within normal limits  PROTIME-INR - Abnormal; Notable for the following:    Prothrombin Time 21.0 (*)    INR 1.78 (*)    All other components within normal limits  POCT I-STAT, CHEM 8 - Abnormal; Notable for the following:    BUN 28 (*)    Creatinine, Ser 1.50 (*)    Glucose, Bld 221 (*)    All other components within normal limits  GLUCOSE, CAPILLARY - Abnormal; Notable for the following:    Glucose-Capillary 201 (*)    All other components within normal limits  CARDIAC PANEL(CRET KIN+CKTOT+MB+TROPI) -  Abnormal; Notable for the following:    Relative Index 2.7 (*)    All other components within normal limits  GLUCOSE, CAPILLARY - Abnormal; Notable for the following:    Glucose-Capillary 133 (*)    All other components within normal limits  CBC  CK TOTAL AND CKMB  TROPONIN I  ETHANOL  APTT  BLOOD GAS, ARTERIAL  MRSA PCR SCREENING  I-STAT, CHEM 8  POCT CBG MONITORING  POCT CBG MONITORING  POCT CBG MONITORING  CARDIAC PANEL(CRET KIN+CKTOT+MB+TROPI)  CULTURE, RESPIRATORY  BASIC METABOLIC PANEL  BLOOD GAS, ARTERIAL  HEPARIN LEVEL  CBC  CARDIAC PANEL(CRET KIN+CKTOT+MB+TROPI)   Ct Head Wo Contrast  11/19/2010  *RADIOLOGY REPORT*  Clinical Data: Headache, left-sided weakness  CT HEAD WITHOUT CONTRAST  Technique:  Contiguous axial images were obtained from the base of the skull through the vertex without contrast.  Comparison: Head CT 05/08/2003 and  Findings: Exam is degraded by patient head motion.  There is no intracranial hemorrhage.  No focal mass lesion.  No CT evidence of acute infarction.  No midline shift or mass effect.  No hydrocephalus. Mild microvascular disease is noted in the deep white matter.  Paranasal sinuses and mastoid air cells are clear.  Orbits are normal.  IMPRESSION:    No acute intracranial findings in exam degraded by patient  motion.  Original Report Authenticated By: Genevive Bi, M.D.   Ct Angio Chest W/cm &/or Wo Cm  11/19/2010  *RADIOLOGY REPORT*  Clinical Data:  Sudden onset of dizziness and headache.  Dyspnea. Unresponsive.  On a ventilator.  Clinical concern for aortic dissection or pulmonary embolism.  CT ANGIOGRAPHY CHEST WITH CONTRAST  Technique:  Multidetector CT imaging of the chest was performed using the standard protocol during bolus administration of intravenous contrast.  Multiplanar CT image reconstructions including MIPs were obtained to evaluate the vascular anatomy.  Contrast: 50mL OMNIPAQUE IOHEXOL 350 MG/ML IV SOLN  Comparison:  Portable  chest obtained earlier today.  Findings:  Endotracheal tube in satisfactory position.  Orogastric or nasogastric tube extending into the stomach.  Enlarged heart. Normally opacified pulmonary arteries.  Breathing motion artifacts at the lung bases with no definite pulmonary arterial filling defects seen.  The examination was timed for evaluation of the pulmonary arteries.  Therefore, no significant contrast is present in the aorta.  No displaced intimal calcifications.  Mild to moderate ground-glass interstitial opacity in both lungs, most pronounced in the dependent portions of both lungs.  There is also some patchy airspace opacity in the dependent portions of both lungs as well as small to moderate-sized bilateral pleural effusions.  No lung masses or enlarged lymph nodes are seen. Unremarkable upper abdomen.  Thoracic spine degenerative changes.  Review of the MIP images confirms the above findings.  IMPRESSION:  1.  No pulmonary emboli. 2.  Unable to evaluate for aortic dissection since the examination was tailored to evaluate for pulmonary emboli. 3.  Cardiomegaly and changes of congestive heart failure with interstitial pulmonary edema and bilateral pleural effusions. 4.  Bilateral dependent atelectasis.  Original Report Authenticated By: Darrol Angel, M.D.   Dg Chest Portable 1 View  11/19/2010  *RADIOLOGY REPORT*  Clinical Data: Endotracheal tube placement.  Shortness of breath.  PORTABLE CHEST - 1 VIEW  Comparison: Chest x-ray 02/02/2010.  Findings: The endotracheal tube is 4.3 cm above the carina.  The heart is enlarged but stable.  Prominent but stable mediastinal and hilar contours.  Somewhat asymmetric airspace process could reflect edema or infiltrates.  No effusions.  IMPRESSION: The endotracheal tube is 4.3 cm above the carina. Probable pulmonary edema versus bronchopneumonia.  Original Report Authenticated By: P. Loralie Champagne, M.D.     1. CVA (cerebral infarction)       MDM  5:03  PM CODE STROKE activated. Pt seen on way to CT scan. Left side weakness and slurred speech.  Upon return from scanner, pt with increased work of breathing, diaphoresis, and chest pain. Concern for cardiac/pulmonary cause for these changes. Pt intubated as elevated wob and decreased GCS/tiring out. PT then sedated on propofol. Critical care medicine and neurology then took over care of the patient. He was taken to IR, but per report, no clot was located. Pt was admitted to ICU.        Daleen Bo Resident 11/20/10 [phone]

## 2010-11-19 NOTE — ED Notes (Signed)
Returned from CT scan. Pt following commands, answers questions appropriately with nodding of head. Denies h/a, does c/o sore throat.

## 2010-11-19 NOTE — ED Notes (Signed)
In CT scan with [place] Sink, RN & neuro MD. Remains Afib on monitor.

## 2010-11-19 NOTE — ED Notes (Signed)
Presently in CT scan, neurologist also present.

## 2010-11-19 NOTE — Code Documentation (Addendum)
55 yo male who was at home with his friend when at 1435 he c/o dizziness, walked to bedroom. He realized something was wrong and asked son to call 9-1-1. He also c/o headache.  9-1-1 was notified at 1638. They activated code stroke at 1656 and pt arrived at 1700 and was met by EDP. Stroke team arrived at same time. ABCs were cleared and pt was taken to CT at 1502. No acute abnormality on CT. Pt became diaphoretic on the CT table, and work of breathing was increased. NIHSS is 12; pt is unable to speak (no sound initially) and LUE is flaccid. Moves other ext well. Pt is alert, has look of impending doom, fear. Reassured. No app field cut or gaze prob, though EMS reported forced gaze to the R in the field. Nods his head that he is taking Coumadin. INR pending. Returned to CT 4; cont to have increased WOB; EDP at bedside; plan to intubate. Will hold off on stroke intervention until pt stabilized.

## 2010-11-19 NOTE — ED Notes (Signed)
Dr. Lyman Speller, neurologist at bedside assessing pt.

## 2010-11-19 NOTE — ED Notes (Signed)
Upon return from CT scan pt with labored resp, c/o difficulty breathing, accessory muscle use.

## 2010-11-19 NOTE — Progress Notes (Signed)
eLink Physician-Brief Progress Note Patient Name: [name] DOB: February 12, 1955 MRN: [id number]  Date of Service  11/19/2010   HPI/Events of Note   Request for maintenance fluids  eICU Interventions  NS at [facility] ordered   Intervention Category Intermediate Interventions: Communication with other healthcare providers and/or family  Alisa Stjames 11/19/2010, 11:48 PM

## 2010-11-19 NOTE — H&P (Signed)
Name: [name] MRN: [id number] DOB: 07/27/1955  DOS:   11/19/10    CRITICAL CARE MEDICINE ADMISSION / CONSULTATION NOTE  Referring Physician  EDP  Reason For Consult  Stroke    Patient Description  55 y/o male with afib presented to the ED with acute onset slurred speech and left sided weakness.  Was intubated for airway protection, while considering catheter directed thrombectomy his symptoms resolved in radiology without intervention.  Pulm edema on CXR.    Location Start Stop        Culture Date Result       Antibiotic Indication Start Stop        GI Prophylaxis DVT Prophylaxis  pepcid Sub q hep   Protocols  Vent stabilization Continuous sedation   Consultants  Neurology Bezov     Date Imaging Studies  11/19/10 11/19/10 CT Angio chest: no PE, bilateral pulmonary edema CT Head: no acute intracranial process   Date Events        History Of Present Illness   55 y/o male with afib presented to the ED with acute onset slurred speech and left sided weakness.  Was intubated for airway protection, while considering catheter directed thrombectomy his symptoms resolved in radiology without intervention.  Pulm edema on CXR.  He does not feel short of breath at this time, denies chest pain.  He is remarkably hypertensive and has significant frothy secretions.  Apparently he had been suffering from some cold symptoms at home.  Past Medical History  Diagnosis Date  . Diabetes mellitus   . Coronary artery disease   . Hypertension   . Gout   . CHF (congestive heart failure)   . Afib     History reviewed. No pertinent past surgical history.      Medications: All home meds reviewed by me, see med rec   No Known Allergies  History reviewed. No pertinent family history.  Social History  Reports that he has quit smoking. His smoking use included Cigarettes. He does not have any smokeless tobacco history on file. He reports that he drinks alcohol. He reports that  he does not use illicit drugs.  Review Of Systems    Cannot obtain due to intubation  Physical Examination  BP 160/130  Pulse 86  Resp 25  Ht 5\' 9"  (1.753 m)  Wt 113.399 kg (250 lb)  BMI 36.92 kg/m2  SpO2 100%  Gen: Intubated, comfortable on vent HEENT: NCAT, PERRL, EOMi, OP clear, neck supple without masses, ETT in place PULM: Coarse rhonchi, insp crackles in lung bilaterally, frothy edema from ETT CV: Irreg irreg, difficult to hear over rhonchi, no JVD AB: BS+, soft, nontender, no hsm Ext: warm, some edema in shins bilaterally, no clubbing, no cyanosis Derm: no rash or skin breakdown Neuro: Awake and alert on vent, follows simple commands, strength 5/5 in all four extremities bilaterally  Labs  Results for orders placed during the hospital encounter of 11/19/10 (from the past 24 hour(s))  PROTIME-INR     Status: Abnormal   Collection Time   11/19/10  5:09 PM      Component Value Range   Prothrombin Time 21.0 (*) 11.6 - 15.2 (seconds)   INR 1.78 (*) 0.00 - 1.49   APTT     Status: Normal   Collection Time   11/19/10  5:09 PM      Component Value Range   aPTT 26  24 - 37 (seconds)  POCT I-STAT, CHEM 8  Status: Abnormal   Collection Time   11/19/10  5:12 PM      Component Value Range   Sodium 136  135 - 145 (mEq/L)   Potassium 4.2  3.5 - 5.1 (mEq/L)   Chloride 107  96 - 112 (mEq/L)   BUN 28 (*) 6 - 23 (mg/dL)   Creatinine, Ser 9.81 (*) 0.50 - 1.35 (mg/dL)   Glucose, Bld 191 (*) 70 - 99 (mg/dL)   Calcium, Ion 4.78  2.95 - 1.32 (mmol/L)   TCO2 23  0 - 100 (mmol/L)   Hemoglobin 13.6  13.0 - 17.0 (g/dL)   HCT 62.1  30.8 - 65.7 (%)  CBC     Status: Normal   Collection Time   11/19/10  5:15 PM      Component Value Range   WBC 8.9  4.0 - 10.5 (K/uL)   RBC 4.60  4.22 - 5.81 (MIL/uL)   Hemoglobin 13.7  13.0 - 17.0 (g/dL)   HCT 84.6  96.2 - 95.2 (%)   MCV 88.0  78.0 - 100.0 (fL)   MCH 29.8  26.0 - 34.0 (pg)   MCHC 33.8  30.0 - 36.0 (g/dL)   RDW 84.1  32.4 - 40.1 (%)    Platelets 254  150 - 400 (K/uL)  DIFFERENTIAL     Status: Abnormal   Collection Time   11/19/10  5:15 PM      Component Value Range   Neutrophils Relative 34 (*) 43 - 77 (%)   Neutro Abs 3.0  1.7 - 7.7 (K/uL)   Lymphocytes Relative 56 (*) 12 - 46 (%)   Lymphs Abs 5.0 (*) 0.7 - 4.0 (K/uL)   Monocytes Relative 7  3 - 12 (%)   Monocytes Absolute 0.7  0.1 - 1.0 (K/uL)   Eosinophils Relative 3  0 - 5 (%)   Eosinophils Absolute 0.3  0.0 - 0.7 (K/uL)   Basophils Relative 0  0 - 1 (%)   Basophils Absolute 0.0  0.0 - 0.1 (K/uL)  COMPREHENSIVE METABOLIC PANEL     Status: Abnormal   Collection Time   11/19/10  5:15 PM      Component Value Range   Sodium 135  135 - 145 (mEq/L)   Potassium 5.5 (*) 3.5 - 5.1 (mEq/L)   Chloride 103  96 - 112 (mEq/L)   CO2 15 (*) 19 - 32 (mEq/L)   Glucose, Bld 211 (*) 70 - 99 (mg/dL)   BUN 25 (*) 6 - 23 (mg/dL)   Creatinine, Ser 0.27 (*) 0.50 - 1.35 (mg/dL)   Calcium 9.9  8.4 - 25.3 (mg/dL)   Total Protein 7.9  6.0 - 8.3 (g/dL)   Albumin 3.4 (*) 3.5 - 5.2 (g/dL)   AST 32  0 - 37 (U/L)   ALT 25  0 - 53 (U/L)   Alkaline Phosphatase 73  39 - 117 (U/L)   Total Bilirubin 0.4  0.3 - 1.2 (mg/dL)   GFR calc non Af Amer 54 (*) >90 (mL/min)   GFR calc Af Amer 62 (*) >90 (mL/min)  ETHANOL     Status: Normal   Collection Time   11/19/10  5:15 PM      Component Value Range   Alcohol, Ethyl (B) <11  0 - 11 (mg/dL)  CK TOTAL AND CKMB     Status: Normal   Collection Time   11/19/10  5:16 PM      Component Value Range   Total CK 117  7 -  232 (U/L)   CK, MB 2.5  0.3 - 4.0 (ng/mL)   Relative Index 2.1  0.0 - 2.5   TROPONIN I     Status: Normal   Collection Time   11/19/10  5:16 PM      Component Value Range   Troponin I <0.30  <0.30 (ng/mL)  GLUCOSE, CAPILLARY     Status: Abnormal   Collection Time   11/19/10  5:17 PM      Component Value Range   Glucose-Capillary 201 (*) 70 - 99 (mg/dL)   EKG: NSR, LVH with repolarization abnormalities  Imaging  Ct Head Wo  Contrast  11/19/2010  *RADIOLOGY REPORT*  Clinical Data: Headache, left-sided weakness  CT HEAD WITHOUT CONTRAST  Technique:  Contiguous axial images were obtained from the base of the skull through the vertex without contrast.  Comparison: Head CT 05/08/2003 and  Findings: Exam is degraded by patient head motion.  There is no intracranial hemorrhage.  No focal mass lesion.  No CT evidence of acute infarction.  No midline shift or mass effect.  No hydrocephalus. Mild microvascular disease is noted in the deep white matter.  Paranasal sinuses and mastoid air cells are clear.  Orbits are normal.  IMPRESSION:    No acute intracranial findings in exam degraded by patient motion.  Original Report Authenticated By: Genevive Bi, M.D.   Ct Angio Chest W/cm &/or Wo Cm  11/19/2010  *RADIOLOGY REPORT*  Clinical Data:  Sudden onset of dizziness and headache.  Dyspnea. Unresponsive.  On a ventilator.  Clinical concern for aortic dissection or pulmonary embolism.  CT ANGIOGRAPHY CHEST WITH CONTRAST  Technique:  Multidetector CT imaging of the chest was performed using the standard protocol during bolus administration of intravenous contrast.  Multiplanar CT image reconstructions including MIPs were obtained to evaluate the vascular anatomy.  Contrast: 50mL OMNIPAQUE IOHEXOL 350 MG/ML IV SOLN  Comparison:  Portable chest obtained earlier today.  Findings:  Endotracheal tube in satisfactory position.  Orogastric or nasogastric tube extending into the stomach.  Enlarged heart. Normally opacified pulmonary arteries.  Breathing motion artifacts at the lung bases with no definite pulmonary arterial filling defects seen.  The examination was timed for evaluation of the pulmonary arteries.  Therefore, no significant contrast is present in the aorta.  No displaced intimal calcifications.  Mild to moderate ground-glass interstitial opacity in both lungs, most pronounced in the dependent portions of both lungs.  There is also some  patchy airspace opacity in the dependent portions of both lungs as well as small to moderate-sized bilateral pleural effusions.  No lung masses or enlarged lymph nodes are seen. Unremarkable upper abdomen.  Thoracic spine degenerative changes.  Review of the MIP images confirms the above findings.  IMPRESSION:  1.  No pulmonary emboli. 2.  Unable to evaluate for aortic dissection since the examination was tailored to evaluate for pulmonary emboli. 3.  Cardiomegaly and changes of congestive heart failure with interstitial pulmonary edema and bilateral pleural effusions. 4.  Bilateral dependent atelectasis.  Original Report Authenticated By: Darrol Angel, M.D.   Dg Chest Portable 1 View  11/19/2010  *RADIOLOGY REPORT*  Clinical Data: Endotracheal tube placement.  Shortness of breath.  PORTABLE CHEST - 1 VIEW  Comparison: Chest x-ray 02/02/2010.  Findings: The endotracheal tube is 4.3 cm above the carina.  The heart is enlarged but stable.  Prominent but stable mediastinal and hilar contours.  Somewhat asymmetric airspace process could reflect edema or infiltrates.  No effusions.  IMPRESSION:  The endotracheal tube is 4.3 cm above the carina. Probable pulmonary edema versus bronchopneumonia.  Original Report Authenticated By: P. Loralie Champagne, M.D.    Assessment 55 y/o male with hypertensive cardiomyopathy and afib admitted for stroke in setting of hypertensive emergency and acute volume overload with pulmonary edema.  His stroke symptoms resolved spontaneously after having what sounded like left paresis and slurred speech (they were resolved by the time I arrived).  He was intubated in the ED for airway protection and found to have significant pulmonary edema and frothy secretions.   Stroke (11/19/2010)   Assessment: Presumed embolic?  Doing well now, appears to have completely resolved.     Plan:  -Neurology Lyman Speller has seen them, f/u their recs for further management/ secondary  prevention.  Hypertensive emergency (11/19/2010)   Assessment: Leading to pulmonary edema, possibly related to stroke symptoms and quick resolution?   Plan:  -start home meds and labetaolol prn  Acute exacerbation of congestive heart failure (11/19/2010)   Assessment: Due to hypertensive emergency, no signs of ischaemia on EKG and no chest pain.   Plan:  -rule out for MI -afterload reduction -diurese  Resiratory Failure   Assessment: due to pulmonary edema from CHF exacerbation   Plan: -full vent support -diurese, afterload reduction  Hypoxemia (11/19/2010)   Assessment: Due to pulm edema, as above   Plan: as above  Diabetes mellitus (11/19/2010)   Assessment: On metformin as outpatient   Plan:  -ICU hyperglycemia protocol  Atrial Fibrillation    Assessment: Rate controlled now   Plan: -hep gtt if OK with neurology as sub therapeutic INR -continue coreg for rate control  Best Practice:  Feeding: npo Analgesia: prn fentanyl Sedation: propofol Thromboprophylaxis: sup q hep (or hep gtt if ok with neurology) HOB >30 degrees Ulcer prophylaxis: pepcid Glucose control: ICU hyperglycemia   Best Practice Feeding:  Analgesia: Sedation: Thromboprophylaxis: HOB >30 degrees Ulcer prophylaxis:  Glucose control:    Cu[place]utemoc Attar, Riley Lam, MD 11/19/2010, 7:31 PM   The patient is critically ill with multiple organ systems failure and requires high complexity decision making for assessment and support, frequent evaluation and titration of therapies, application of advanced monitoring technologies and extensive interpretation of multiple databases. Critical Care Time devoted to patient care services described in this note is 60 minutes.

## 2010-11-19 NOTE — Consult Note (Signed)
Reason for Consult: Left arm weakness  HPI: JOSIYAH TOZZI is an 55 y.o. Male who became dizzy and then developed left arm weakness followed by respiratory distress. Stroke code with NIHSS of 12, but not candidate due to high INR. No throbmectomy as no clot visualized on CTA H/N.  Past Medical History  Diagnosis Date  . Diabetes mellitus   . Coronary artery disease   . Hypertension   . Gout    No past surgical history on file.  No family history on file.  Social History:  Alcohol and tobacco abuse.  Allergies: Allergies not on file  Medications: I have reviewed the patient's current medications.  @ROS @  Blood pressure 160/130, pulse 86, resp. rate 25, height 5\' 9"  (1.753 m), weight 113.399 kg (250 lb), SpO2 100.00%.  Respiratory: acute distress with agonal breathing followed by intubation. Neurological exam: was able to follow some simple commands intermittently. Cranial nerves: EOMI, PERRL. Visual fields were full. There was no facial asymmetry. Hearing to finger rub was equal and symmetrical bilaterally.Motor: strength was 5/5 and symmetric throughout except left arm, which was 2/5. Sensory: was intact throughout to pinprick. Coordination: could not assess. Reflexes: were 1+ in upper extremities and 1+ at the knees and 1+ at the ankles. Plantar response was downgoing bilaterally. Gait: deferred  Results for orders placed during the hospital encounter of 11/19/10 (from the past 48 hour(s))  PROTIME-INR     Status: Abnormal   Collection Time   11/19/10  5:09 PM      Component Value Range Comment   Prothrombin Time 21.0 (*) 11.6 - 15.2 (seconds)    INR 1.78 (*) 0.00 - 1.49    APTT     Status: Normal   Collection Time   11/19/10  5:09 PM      Component Value Range Comment   aPTT 26  24 - 37 (seconds)   POCT I-STAT, CHEM 8     Status: Abnormal   Collection Time   11/19/10  5:12 PM      Component Value Range Comment   Sodium 136  135 - 145 (mEq/L)    Potassium 4.2  3.5 - 5.1  (mEq/L)    Chloride 107  96 - 112 (mEq/L)    BUN 28 (*) 6 - 23 (mg/dL)    Creatinine, Ser 1.61 (*) 0.50 - 1.35 (mg/dL)    Glucose, Bld 096 (*) 70 - 99 (mg/dL)    Calcium, Ion 0.45  1.12 - 1.32 (mmol/L)    TCO2 23  0 - 100 (mmol/L)    Hemoglobin 13.6  13.0 - 17.0 (g/dL)    HCT 40.9  81.1 - 91.4 (%)   CBC     Status: Normal   Collection Time   11/19/10  5:15 PM      Component Value Range Comment   WBC 8.9  4.0 - 10.5 (K/uL)    RBC 4.60  4.22 - 5.81 (MIL/uL)    Hemoglobin 13.7  13.0 - 17.0 (g/dL)    HCT 78.2  95.6 - 21.3 (%)    MCV 88.0  78.0 - 100.0 (fL)    MCH 29.8  26.0 - 34.0 (pg)    MCHC 33.8  30.0 - 36.0 (g/dL)    RDW 08.6  57.8 - 46.9 (%)    Platelets 254  150 - 400 (K/uL)   DIFFERENTIAL     Status: Abnormal   Collection Time   11/19/10  5:15 PM      Component  Value Range Comment   Neutrophils Relative 34 (*) 43 - 77 (%)    Neutro Abs 3.0  1.7 - 7.7 (K/uL)    Lymphocytes Relative 56 (*) 12 - 46 (%)    Lymphs Abs 5.0 (*) 0.7 - 4.0 (K/uL)    Monocytes Relative 7  3 - 12 (%)    Monocytes Absolute 0.7  0.1 - 1.0 (K/uL)    Eosinophils Relative 3  0 - 5 (%)    Eosinophils Absolute 0.3  0.0 - 0.7 (K/uL)    Basophils Relative 0  0 - 1 (%)    Basophils Absolute 0.0  0.0 - 0.1 (K/uL)   COMPREHENSIVE METABOLIC PANEL     Status: Abnormal   Collection Time   11/19/10  5:15 PM      Component Value Range Comment   Sodium 135  135 - 145 (mEq/L)    Potassium 5.5 (*) 3.5 - 5.1 (mEq/L) HEMOLYSIS AT THIS LEVEL MAY AFFECT RESULT   Chloride 103  96 - 112 (mEq/L)    CO2 15 (*) 19 - 32 (mEq/L)    Glucose, Bld 211 (*) 70 - 99 (mg/dL)    BUN 25 (*) 6 - 23 (mg/dL)    Creatinine, Ser 4.54 (*) 0.50 - 1.35 (mg/dL)    Calcium 9.9  8.4 - 10.5 (mg/dL)    Total Protein 7.9  6.0 - 8.3 (g/dL)    Albumin 3.4 (*) 3.5 - 5.2 (g/dL)    AST 32  0 - 37 (U/L) HEMOLYSIS AT THIS LEVEL MAY AFFECT RESULT   ALT 25  0 - 53 (U/L)    Alkaline Phosphatase 73  39 - 117 (U/L)    Total Bilirubin 0.4  0.3 - 1.2 (mg/dL)     GFR calc non Af Amer 54 (*) >90 (mL/min)    GFR calc Af Amer 62 (*) >90 (mL/min)   ETHANOL     Status: Normal   Collection Time   11/19/10  5:15 PM      Component Value Range Comment   Alcohol, Ethyl (B) <11  0 - 11 (mg/dL)   CK TOTAL AND CKMB     Status: Normal   Collection Time   11/19/10  5:16 PM      Component Value Range Comment   Total CK 117  7 - 232 (U/L)    CK, MB 2.5  0.3 - 4.0 (ng/mL)    Relative Index 2.1  0.0 - 2.5    TROPONIN I     Status: Normal   Collection Time   11/19/10  5:16 PM      Component Value Range Comment   Troponin I <0.30  <0.30 (ng/mL)   GLUCOSE, CAPILLARY     Status: Abnormal   Collection Time   11/19/10  5:17 PM      Component Value Range Comment   Glucose-Capillary 201 (*) 70 - 99 (mg/dL)     Ct Head Wo Contrast  11/19/2010  *RADIOLOGY REPORT*  Clinical Data: Headache, left-sided weakness  CT HEAD WITHOUT CONTRAST  Technique:  Contiguous axial images were obtained from the base of the skull through the vertex without contrast.  Comparison: Head CT 05/08/2003 and  Findings: Exam is degraded by patient head motion.  There is no intracranial hemorrhage.  No focal mass lesion.  No CT evidence of acute infarction.  No midline shift or mass effect.  No hydrocephalus. Mild microvascular disease is noted in the deep white matter.  Paranasal sinuses and mastoid  air cells are clear.  Orbits are normal.  IMPRESSION:    No acute intracranial findings in exam degraded by patient motion.  Original Report Authenticated By: Genevive Bi, M.D.   Ct Angio Chest W/cm &/or Wo Cm  11/19/2010  *RADIOLOGY REPORT*  Clinical Data:  Sudden onset of dizziness and headache.  Dyspnea. Unresponsive.  On a ventilator.  Clinical concern for aortic dissection or pulmonary embolism.  CT ANGIOGRAPHY CHEST WITH CONTRAST  Technique:  Multidetector CT imaging of the chest was performed using the standard protocol during bolus administration of intravenous contrast.  Multiplanar CT image  reconstructions including MIPs were obtained to evaluate the vascular anatomy.  Contrast: 50mL OMNIPAQUE IOHEXOL 350 MG/ML IV SOLN  Comparison:  Portable chest obtained earlier today.  Findings:  Endotracheal tube in satisfactory position.  Orogastric or nasogastric tube extending into the stomach.  Enlarged heart. Normally opacified pulmonary arteries.  Breathing motion artifacts at the lung bases with no definite pulmonary arterial filling defects seen.  The examination was timed for evaluation of the pulmonary arteries.  Therefore, no significant contrast is present in the aorta.  No displaced intimal calcifications.  Mild to moderate ground-glass interstitial opacity in both lungs, most pronounced in the dependent portions of both lungs.  There is also some patchy airspace opacity in the dependent portions of both lungs as well as small to moderate-sized bilateral pleural effusions.  No lung masses or enlarged lymph nodes are seen. Unremarkable upper abdomen.  Thoracic spine degenerative changes.  Review of the MIP images confirms the above findings.  IMPRESSION:  1.  No pulmonary emboli. 2.  Unable to evaluate for aortic dissection since the examination was tailored to evaluate for pulmonary emboli. 3.  Cardiomegaly and changes of congestive heart failure with interstitial pulmonary edema and bilateral pleural effusions. 4.  Bilateral dependent atelectasis.  Original Report Authenticated By: Darrol Angel, M.D.   Dg Chest Portable 1 View  11/19/2010  *RADIOLOGY REPORT*  Clinical Data: Endotracheal tube placement.  Shortness of breath.  PORTABLE CHEST - 1 VIEW  Comparison: Chest x-ray 02/02/2010.  Findings: The endotracheal tube is 4.3 cm above the carina.  The heart is enlarged but stable.  Prominent but stable mediastinal and hilar contours.  Somewhat asymmetric airspace process could reflect edema or infiltrates.  No effusions.  IMPRESSION: The endotracheal tube is 4.3 cm above the carina. Probable  pulmonary edema versus bronchopneumonia.  Original Report Authenticated By: P. Loralie Champagne, M.D.    Assessment/Plan: 55 years old man who developed new left arm weakness and was brought in as a stroke code. He is not a t-PA candidate as his INR was more than 1.7. He had respiratory distress and had to be intubated. CTA H and N performed in ED did not show a clot that could be taken out. In all likelihood, he embolized with subtherapeutic INR and then the clot broke apart.  1) MRI/MRA brain, CD 2) ICU 3) Will follow  Manasa Spease 11/19/2010, 7:16 PM

## 2010-11-19 NOTE — Progress Notes (Signed)
ANTICOAGULATION CONSULT NOTE - Initial Consult  Pharmacy Consult for UFH Indication: atrial fibrillation  No Known Allergies  Patient Measurements: Height: 5\' 9"  (175.3 cm) Weight: 224 lb 10.4 oz (101.9 kg) IBW/kg (Calculated) : 70.7  Adjusted Body Weight: 83kg  Vital Signs: Temp: 98.4 F (36.9 C) (11/07 2100) Temp src: Axillary (11/07 2100) BP: 141/104 mmHg (11/07 2115) Pulse Rate: 47  (11/07 2115)  Labs:  Basename 11/19/10 1716 11/19/10 1715 11/19/10 1712 11/19/10 1709  HGB -- 13.7 13.6 --  HCT -- 40.5 40.0 --  PLT -- 254 -- --  APTT -- -- -- 26  LABPROT -- -- -- 21.0*  INR -- -- -- 1.78*  HEPARINUNFRC -- -- -- --  CREATININE -- 1.42* 1.50* --  CKTOTAL 117 -- -- --  CKMB 2.5 -- -- --  TROPONINI <0.30 -- -- --   Estimated Creatinine Clearance: 68.4 ml/min (by C-G formula based on Cr of 1.42).  Medical History: Past Medical History  Diagnosis Date  . Diabetes mellitus   . Coronary artery disease   . Hypertension   . Gout   . CHF (congestive heart failure)   . Afib     Medications:  Prescriptions prior to admission  Medication Sig Dispense Refill  . allopurinol (ZYLOPRIM) 300 MG tablet Take 300 mg by mouth daily.        Marland Kitchen amLODipine (NORVASC) 5 MG tablet Take 5 mg by mouth daily.        Marland Kitchen aspirin 81 MG tablet Take 81 mg by mouth daily.        . carvedilol (COREG) 25 MG tablet Take 25 mg by mouth 2 (two) times daily with a meal.        . celecoxib (CELEBREX) 200 MG capsule Take 200 mg by mouth 2 (two) times daily.        . cloNIDine (CATAPRES) 0.1 MG tablet Take 0.1 mg by mouth 2 (two) times daily.        . colchicine 0.6 MG tablet Take 0.6 mg by mouth daily.        . cyclobenzaprine (FLEXERIL) 10 MG tablet Take 10 mg by mouth 2 (two) times a week.        . furosemide (LASIX) 20 MG tablet Take 20 mg by mouth daily.        . isosorbide-hydrALAZINE (BIDIL) 20-37.5 MG per tablet Take 1 tablet by mouth 3 (three) times daily.        . metFORMIN (GLUCOPHAGE) 500  MG tablet Take 500 mg by mouth 2 (two) times daily with a meal.        . Multiple Vitamin (MULTIVITAMIN) capsule Take 1 capsule by mouth daily.        . ramipril (ALTACE) 10 MG tablet Take 10 mg by mouth daily.        Marland Kitchen spironolactone (ALDACTONE) 50 MG tablet Take 50 mg by mouth daily.        Marland Kitchen warfarin (COUMADIN) 7.5 MG tablet Take 7.5 mg by mouth daily.        . AMOXICILLIN PO Take 400 mg by mouth 2 (two) times daily.        . naproxen sodium (ANAPROX) 220 MG tablet Take 220 mg by mouth as needed.          Assessment: 55 y/o male patient admitted with stroke like symptoms, found to be in hypertensive crisis with pulmonary edema, stroke ruled out. On chronic coumadin for h/o Afib, INR subtherapeutic on admit requiring full-dose  anticoagulation. Patient then intubated, coumadin on hold for now until becomes more stable.   Goal of Therapy:  Heparin level 0.3-0.7 units/ml   Plan:  Begin heparin gtt at 1000 units/hr , no bolus, and check heparin level in 6 hours. Daily heparin level and cbc.  Leodis Binet, Addison Whidbee M 11/19/2010,9:50 PM

## 2010-11-19 NOTE — ED Notes (Signed)
Pt's brother, Molly Maduro cell phone # [phone]

## 2010-11-19 NOTE — Progress Notes (Signed)
eLink Physician-Brief Progress Note Patient Name: [name] DOB: [birth year] MRN: [id number]  Date of Service  11/19/2010   HPI/Events of Note   Dr Kendrick Fries bedside CCM MD wanted me to touch base with Dr Terrace Arabia neuro with some clarifications  eICU Interventions  1. I read out CT head without bleed report and note of neuro that CT angio ws free of hemorrhage. Based on that Dr Terrace Arabia recommended IV heparin and requested I place order   2. BP goal given   Intervention Category Intermediate Interventions: Communication with other healthcare providers and/or family  Gola Bribiesca 11/19/2010, 8:48 PM

## 2010-11-19 NOTE — Progress Notes (Signed)
eLink Physician-Brief Progress Note Patient Name: [name] DOB: 23-Jun-1955 MRN: [id number]  Date of Service  11/19/2010   HPI/Events of Note   Confusion about need for heparin or not. Pharmacist CHristian does not feel it is indicated. He feels patient should be on coumadin.    eICU Interventions  I have asked bedside CCM MD to discuss with Dr. Terrace Arabia neuro and create an anticoagulation plan. Till thenI will dc heparin order   Intervention Category Intermediate Interventions: Communication with other healthcare providers and/or family  [name] 11/19/2010, 9:27 PM

## 2010-11-19 NOTE — ED Notes (Signed)
PT came to ED with his bag of medications. Medications given to main pharmacy.

## 2010-11-20 ENCOUNTER — Inpatient Hospital Stay (HOSPITAL_COMMUNITY): Payer: 59

## 2010-11-20 ENCOUNTER — Encounter (HOSPITAL_COMMUNITY): Payer: Self-pay | Admitting: *Deleted

## 2010-11-20 DIAGNOSIS — M109 Gout, unspecified: Secondary | ICD-10-CM | POA: Diagnosis present

## 2010-11-20 DIAGNOSIS — J9600 Acute respiratory failure, unspecified whether with hypoxia or hypercapnia: Secondary | ICD-10-CM

## 2010-11-20 DIAGNOSIS — I10 Essential (primary) hypertension: Secondary | ICD-10-CM

## 2010-11-20 DIAGNOSIS — G459 Transient cerebral ischemic attack, unspecified: Secondary | ICD-10-CM

## 2010-11-20 DIAGNOSIS — J189 Pneumonia, unspecified organism: Secondary | ICD-10-CM

## 2010-11-20 DIAGNOSIS — I2510 Atherosclerotic heart disease of native coronary artery without angina pectoris: Secondary | ICD-10-CM

## 2010-11-20 HISTORY — DX: Atherosclerotic heart disease of native coronary artery without angina pectoris: I25.10

## 2010-11-20 HISTORY — DX: Gout, unspecified: M10.9

## 2010-11-20 LAB — BASIC METABOLIC PANEL
Calcium: 9.7 mg/dL (ref 8.4–10.5)
GFR calc non Af Amer: 55 mL/min — ABNORMAL LOW (ref >90)
Sodium: 134 mEq/L — ABNORMAL LOW (ref 135–145)

## 2010-11-20 LAB — CARDIAC PANEL(CRET KIN+CKTOT+MB+TROPI)
CK, MB: 2.9 ng/mL (ref 0.3–4.0)
Relative Index: 2.7 — ABNORMAL HIGH (ref 0.0–2.5)
Relative Index: INVALID (ref 0.0–2.5)
Total CK: 107 U/L (ref 7–232)
Troponin I: <0.3 ng/mL (ref <0.30)

## 2010-11-20 LAB — LIPID PANEL
Total CHOL/HDL Ratio: 4 RATIO
VLDL: 32 mg/dL (ref 0–40)

## 2010-11-20 LAB — CBC
HCT: 35.5 % — ABNORMAL LOW (ref 39.0–52.0)
Hemoglobin: 11.8 g/dL — ABNORMAL LOW (ref 13.0–17.0)
MCV: 87.7 fL (ref 78.0–100.0)
RBC: 4.05 MIL/uL — ABNORMAL LOW (ref 4.22–5.81)
WBC: 6.4 10*3/uL (ref 4.0–10.5)

## 2010-11-20 LAB — GLUCOSE, CAPILLARY
Glucose-Capillary: 128 mg/dL — ABNORMAL HIGH (ref 70–99)
Glucose-Capillary: 133 mg/dL — ABNORMAL HIGH (ref 70–99)
Glucose-Capillary: 150 mg/dL — ABNORMAL HIGH (ref 70–99)
Glucose-Capillary: 219 mg/dL — ABNORMAL HIGH (ref 70–99)

## 2010-11-20 LAB — BLOOD GAS, ARTERIAL
Acid-base deficit: 1.3 mmol/L (ref 0.0–2.0)
Bicarbonate: 23.1 mEq/L (ref 20.0–24.0)
FIO2: 0.5 %
O2 Saturation: 98.7 %
PEEP: 5 cmH2O
pO2, Arterial: 126 mmHg — ABNORMAL HIGH (ref 80.0–100.0)

## 2010-11-20 LAB — TSH: TSH: 0.895 u[IU]/mL (ref 0.350–4.500)

## 2010-11-20 LAB — HEPARIN LEVEL (UNFRACTIONATED): Heparin Unfractionated: <0.1 IU/mL — ABNORMAL LOW (ref 0.30–0.70)

## 2010-11-20 MED ORDER — BIOTENE DRY MOUTH MT LIQD
15.0000 mL | Freq: Four times a day (QID) | OROMUCOSAL | Status: DC
  Administered 2010-11-21 – 2010-12-22 (×126): 15 mL via OROMUCOSAL

## 2010-11-20 MED ORDER — CHLORHEXIDINE GLUCONATE 0.12 % MT SOLN
OROMUCOSAL | Status: AC
  Administered 2010-11-20: 08:00:00
  Filled 2010-11-20: qty 15

## 2010-11-20 MED ORDER — CHLORHEXIDINE GLUCONATE 0.12 % MT SOLN
15.0000 mL | Freq: Two times a day (BID) | OROMUCOSAL | Status: DC
  Administered 2010-11-20 – 2010-12-22 (×62): 15 mL via OROMUCOSAL
  Filled 2010-11-20 (×61): qty 15
  Filled 2010-11-20: qty 30
  Filled 2010-11-20 (×12): qty 15

## 2010-11-20 MED ORDER — CEFTRIAXONE SODIUM 1 G IJ SOLR: 1.0000 g | INTRAMUSCULAR | Status: DC

## 2010-11-20 MED ORDER — FENTANYL CITRATE 0.05 MG/ML IJ SOLN
50.0000 ug | INTRAMUSCULAR | Status: DC | PRN
  Administered 2010-11-20: 50 ug via INTRAVENOUS
  Administered 2010-11-20: 75 ug via INTRAVENOUS
  Administered 2010-11-20: 50 ug via INTRAVENOUS
  Administered 2010-11-20 – 2010-11-21 (×3): 100 ug via INTRAVENOUS
  Filled 2010-11-20 (×6): qty 2

## 2010-11-20 MED ORDER — DEXTROSE 5 % IV SOLN
1.0000 g | INTRAVENOUS | Status: DC
  Administered 2010-11-20 – 2010-11-24 (×5): 1 g via INTRAVENOUS
  Filled 2010-11-20 (×5): qty 10

## 2010-11-20 MED ORDER — PNEUMOCOCCAL VAC POLYVALENT 25 MCG/0.5ML IJ INJ
0.5000 mL | INJECTION | INTRAMUSCULAR | Status: AC
  Administered 2010-11-21: 0.5 mL via INTRAMUSCULAR
  Filled 2010-11-20: qty 0.5

## 2010-11-20 MED ORDER — FUROSEMIDE 10 MG/ML IJ SOLN
40.0000 mg | Freq: Two times a day (BID) | INTRAMUSCULAR | Status: DC
  Administered 2010-11-20 (×2): 40 mg via INTRAVENOUS
  Filled 2010-11-20 (×3): qty 4

## 2010-11-20 MED ORDER — INFLUENZA VAC TYP A&B SURF ANT IM INJ
0.5000 mL | INJECTION | INTRAMUSCULAR | Status: AC
  Administered 2010-11-21: 0.5 mL via INTRAMUSCULAR
  Filled 2010-11-20: qty 0.5

## 2010-11-20 MED ORDER — HEPARIN (PORCINE) IN NACL 100-0.45 UNIT/ML-% IJ SOLN
1700.0000 [IU]/h | INTRAMUSCULAR | Status: DC
  Administered 2010-11-20 – 2010-11-21 (×2): 1700 [IU]/h via INTRAVENOUS
  Filled 2010-11-20 (×6): qty 250

## 2010-11-20 MED ORDER — DEXTROSE 5 % IV SOLN
500.0000 mg | INTRAVENOUS | Status: DC
  Administered 2010-11-20 – 2010-11-24 (×5): 500 mg via INTRAVENOUS
  Filled 2010-11-20 (×5): qty 500

## 2010-11-20 MED ORDER — MIDAZOLAM HCL 5 MG/5ML IJ SOLN: 2.0000 mg | INTRAMUSCULAR | Status: DC | PRN

## 2010-11-20 NOTE — Progress Notes (Addendum)
[name] is a 55 y.o. male former smoker admitted on 11/19/2010 with acute onset of slurred speech and Lt sided weakness.  Intubated in ED for airway protection and pulmonary edema.  Had spontaneous resolution of neuro symptoms. PMHx DM, CAD, CHF, HTN, Gout, A fib  Line/tubes: ETT 11/7>>  Abx: Rocephin 11/8>> Zithromax 11/8>>  Cx: Blood 11/8>> Sputum 11/8>>  Best practice: Pepcid Heparin gtt/coumadin for A fib  Consults: Neuro  Tests/events: 11/7: CT head>>negative 11/7: CT chest>>b/l GGO more at bases, small b/l effusions, no PE, dependent ATX  SUBJECTIVE: Sedated.  Tolerating pressure support.  Hypothermic with temp 94F.  OBJECTIVE:  Blood pressure 109/80, pulse 59, temperature 97.4 F (36.3 C), temperature source Rectal, resp. rate 13, height 5\' 9"  (1.753 m), weight 231 lb 7.7 oz (105 kg), SpO2 100.00%.   Intake/Output Summary (Last 24 hours) at 11/20/10 0759 Last data filed at 11/20/10 0700  Gross per 24 hour  Intake    130 ml  Output   1150 ml  Net  -1020 ml   General - obese, no distress HEENT - Pupils reactive Cardiac - irregular Chest - scattered rhonchi Abd - soft, non-tender Ext - no edema Neuro - sedated  Lab Results  Component Value Date   CREATININE 1.39* 11/20/2010   BUN 23 11/20/2010   NA 134* 11/20/2010   K 3.5 11/20/2010   CL 102 11/20/2010   CO2 22 11/20/2010   CBC    Component Value Date/Time   WBC 6.4 11/20/2010 0400   RBC 4.05* 11/20/2010 0400   HGB 11.8* 11/20/2010 0400   HCT 35.5* 11/20/2010 0400   PLT 235 11/20/2010 0400   MCV 87.7 11/20/2010 0400   MCH 29.1 11/20/2010 0400   MCHC 33.2 11/20/2010 0400   RDW 15.0 11/20/2010 0400   LYMPHSABS 5.0* 11/19/2010 1715   MONOABS 0.7 11/19/2010 1715   EOSABS 0.3 11/19/2010 1715   BASOSABS 0.0 11/19/2010 1715    11/20/2010  *RADIOLOGY REPORT*  Clinical Data: Pulmonary edema.  PORTABLE CHEST - 1 VIEW  Comparison: 11/19/2010,  Findings: A nasogastric tube has been placed and the tip is located  below the level of the hemidiaphragms but not visible on this exam. The endotracheal tube is stable in position.  Cardiomegaly is again identified and stable in degree. The mediastinal contour is unchanged.  There has been an interval increase in bibasilar density since the previous exam. Prominence of the minor fissure suggests a component of this density on the right may be related to posteriorly layering pleural fluid. Basilar density may be the result of basilar alveolar edema although pneumonia is not excluded with this appearance.  IMPRESSION: Increasing bibasilar density with suspicion of some right pleural fluid and fissural fluid.  Question basilar alveolar edema versus pneumonia.  Original Report Authenticated By: Bertha Stakes, M.D.   ASSESSMENT/PLAN:  Encephalopathy with concern for cardio-embolic TIA in setting of A. Fib -limit sedation to assist with vent weaning -appreciate help from neuro -check Echo to assess for source of emboli>>may need TEE  Acute respiratory failure 2nd to pulmonary edema with VDRF -pressure support wean as tolerated -f/u CXR  Hypertensive emergency with likely acute diastolic dysfunction leading to pulmonary edema -continue home regimen of amlodipine, carvedilol, clonidine, bidil, ramipril, spironolactone -continue lasix to keep in negative fluid balance  A. Fib -continue heparin and have pharmacy dose coumadin  Hx of CAD -continue ASA, carvedilol  DM -SSI -hold outpt regimen of metformin since he had CT with IV contrast  Gout -will need to resume allopurinol, colchicine   Rt lower ASD with ?PNA -will start rocephin, zithromax -check blood cx, sputum culture  Thyroiditis -CT images d/w radiology with concerns for thyroid inflammation -will check TSH, Free T4  Shaunee Mulkern Pager:  [phone] 11/20/2010, 7:59 AM

## 2010-11-20 NOTE — Progress Notes (Signed)
ANTICOAGULATION CONSULT NOTE   Pharmacy Consult for UFH Indication: atrial fibrillation  No Known Allergies  Patient Measurements: Height: 5\' 9"  (175.3 cm) Weight: 231 lb 7.7 oz (105 kg) IBW/kg (Calculated) : 70.7  Adjusted Body Weight: 83kg  Vital Signs: Temp: 98.4 F (36.9 C) (11/07 2100) Temp src: Axillary (11/07 2100) BP: 109/74 mmHg (11/08 0600) Pulse Rate: 63  (11/08 0600)  Labs:  Basename 11/20/10 0400 11/20/10 0004 11/19/10 1716 11/19/10 1715 11/19/10 1712 11/19/10 1709  HGB 11.8* -- -- 13.7 -- --  HCT 35.5* -- -- 40.5 40.0 --  PLT 235 -- -- 254 -- --  APTT -- -- -- -- -- 26  LABPROT -- -- -- -- -- 21.0*  INR -- -- -- -- -- 1.78*  HEPARINUNFRC <0.10* -- -- -- -- --  CREATININE 1.39* -- -- 1.42* 1.50* --  CKTOTAL -- 100 117 -- -- --  CKMB -- 2.7 2.5 -- -- --  TROPONINI -- <0.30 <0.30 -- -- --   Estimated Creatinine Clearance: 70.8 ml/min (by C-G formula based on Cr of 1.39).  Medical History: Past Medical History  Diagnosis Date  . Diabetes mellitus   . Coronary artery disease   . Hypertension   . Gout   . CHF (congestive heart failure)   . Afib     Medications:  Prescriptions prior to admission  Medication Sig Dispense Refill  . allopurinol (ZYLOPRIM) 300 MG tablet Take 300 mg by mouth daily.        Marland Kitchen amLODipine (NORVASC) 5 MG tablet Take 5 mg by mouth daily.        Marland Kitchen aspirin 81 MG tablet Take 81 mg by mouth daily.        . carvedilol (COREG) 25 MG tablet Take 25 mg by mouth 2 (two) times daily with a meal.        . celecoxib (CELEBREX) 200 MG capsule Take 200 mg by mouth 2 (two) times daily.        . cloNIDine (CATAPRES) 0.1 MG tablet Take 0.1 mg by mouth 2 (two) times daily.        . colchicine 0.6 MG tablet Take 0.6 mg by mouth daily.        . cyclobenzaprine (FLEXERIL) 10 MG tablet Take 10 mg by mouth 2 (two) times a week.        . furosemide (LASIX) 20 MG tablet Take 20 mg by mouth daily.        . isosorbide-hydrALAZINE (BIDIL) 20-37.5 MG per  tablet Take 1 tablet by mouth 3 (three) times daily.        . metFORMIN (GLUCOPHAGE) 500 MG tablet Take 500 mg by mouth 2 (two) times daily with a meal.        . Multiple Vitamin (MULTIVITAMIN) capsule Take 1 capsule by mouth daily.        . ramipril (ALTACE) 10 MG tablet Take 10 mg by mouth daily.        Marland Kitchen spironolactone (ALDACTONE) 50 MG tablet Take 50 mg by mouth daily.        Marland Kitchen warfarin (COUMADIN) 7.5 MG tablet Take 7.5 mg by mouth daily.        . AMOXICILLIN PO Take 400 mg by mouth 2 (two) times daily.        . naproxen sodium (ANAPROX) 220 MG tablet Take 220 mg by mouth as needed.          Assessment: 54 y/o male patient admitted with h/o Afib, Coumadin  on hold, for anticoagulation.  Heparin infusing properly per RN  Goal of Therapy:  Heparin level 0.3-0.7 units/ml   Plan: Increase Heparin 1400 units/hr, recheck Heparin level this afternoon.   Eddie Candle 11/20/2010,6:23 AM

## 2010-11-20 NOTE — Progress Notes (Signed)
  Echocardiogram 2D Echocardiogram has been performed.  HAIRSTON, Emiel Kielty 11/20/2010, 3:41 PM

## 2010-11-20 NOTE — ED Provider Notes (Signed)
  I performed a history and physical examination of [name] and discussed his management with Dr.  Aubery Lapping.  I agree with the history, physical, assessment, and plan of care, with the following exceptions: None  I was present for the following procedures: intubation (performed by me) Time Spent in Critical Care of the patient: 35 min Time spent in discussions with the patient and family: 71  This elderly male presented via Stroke Code w acute L sided hemi-deficits.  Soon after return from CT the patient became obtunded w increased WOB and early hypoxia.  He was intubated for airway protection, and to expedite care (initial CT negative).  Following intubation, the patient went to IR, where no acute clot was found.  He returned to ED, and spontaneously began to have function in his L side again. Care was transferred to ICU team.  Endotracheal Intubation Procedure Note Indication for endotracheal intubation: impending airway compromise and impending respiratory failure Sedation: etomidate Paralytic: succinylcholine Lidocaine: no Atropine: no Equipment: 8.30mm cuffed endotracheal tube via glidescope Cricoid Pressure: yes Number of attempts: 3 ETT location confirmed by by auscultation, by CXR and ETCO2 monitor.            Elyse Jarvis, MD 11/20/10 [phone]

## 2010-11-20 NOTE — Progress Notes (Signed)
Stroke Team Progress Note Subjective: 55 year old african [place] male with new left arm weakness and was brought in as a stroke code. He is not a t-PA candidate as his INR was more than 1.7. He had respiratory distress and had to be intubated. CTA H and N performed in ED did not show a clot that could be taken out. In all likelihood, he embolized with subtherapeutic INR and then the clot broke apart.he remains intubated today but is able to follow commands. He tells me that he had dizziness and left arm weakness prior to admission. He states he was compliant with his Coumadin but yet had an suboptimal and on admission. He denies prior history of stroke. He is yet intubated because of pulmonary edema and respiratory distress   Objective: Vital signs: Blood pressure 109/80, pulse 59, temperature 97.4 F (36.3 C), temperature source Rectal, resp. rate 13, height 5\' 9"  (1.753 m), weight 105 kg (231 lb 7.7 oz), SpO2 100.00%.  Intake/Output from previous day: 11/07 0701 - 11/08 0700 In: 130 [I.V.:80; IV Piggyback:50] Out: 1150 [Urine:1150]   IV Intake:    . sodium chloride 10 mL/hr at 11/20/10 0800  . dextrose    . heparin 14 mL/hr (11/20/10 0800)  . DISCONTD: heparin 12 Units/kg/hr (11/20/10 0600)  . DISCONTD: propofol 20 mcg/kg/min (11/19/10 1925)  . DISCONTD: propofol Stopped (11/20/10 0746)    Diet: NPO  Activity: Bedrest  DVT Prophylaxis:  SCDs, IV heparin  Physical Exam:  Respiratory: acute distress with agonal breathing followed by intubation. Neurological exam: was able to follow some simple commands intermittently. Cranial nerves: EOMI, PERRL. Visual fields were full. There was no facial asymmetry. Hearing to finger rub was equal and symmetrical bilaterally.Motor: strength was 5/5 and symmetric throughout except left arm, which was 2/5. Sensory: was intact throughout to pinprick. Coordination: could not assess. Reflexes: were 1+ in upper extremities and 1+ at the knees and 1+ at  the ankles. Plantar response was downgoing bilaterally. Gait: deferred   Studies: Results for orders placed during the hospital encounter of 11/19/10 (from the past 24 hour(s))  PROTIME-INR     Status: Abnormal   Collection Time   11/19/10  5:09 PM      Component Value Range   Prothrombin Time 21.0 (*) 11.6 - 15.2 (seconds)   INR 1.78 (*) 0.00 - 1.49   APTT     Status: Normal   Collection Time   11/19/10  5:09 PM      Component Value Range   aPTT 26  24 - 37 (seconds)  POCT I-STAT, CHEM 8     Status: Abnormal   Collection Time   11/19/10  5:12 PM      Component Value Range   Sodium 136  135 - 145 (mEq/L)   Potassium 4.2  3.5 - 5.1 (mEq/L)   Chloride 107  96 - 112 (mEq/L)   BUN 28 (*) 6 - 23 (mg/dL)   Creatinine, Ser 4.09 (*) 0.50 - 1.35 (mg/dL)   Glucose, Bld 811 (*) 70 - 99 (mg/dL)   Calcium, Ion 9.14  7.82 - 1.32 (mmol/L)   TCO2 23  0 - 100 (mmol/L)   Hemoglobin 13.6  13.0 - 17.0 (g/dL)   HCT 95.6  21.3 - 08.6 (%)  CBC     Status: Normal   Collection Time   11/19/10  5:15 PM      Component Value Range   WBC 8.9  4.0 - 10.5 (K/uL)   RBC  4.60  4.22 - 5.81 (MIL/uL)   Hemoglobin 13.7  13.0 - 17.0 (g/dL)   HCT 04.5  40.9 - 81.1 (%)   MCV 88.0  78.0 - 100.0 (fL)   MCH 29.8  26.0 - 34.0 (pg)   MCHC 33.8  30.0 - 36.0 (g/dL)   RDW 91.4  78.2 - 95.6 (%)   Platelets 254  150 - 400 (K/uL)  DIFFERENTIAL     Status: Abnormal   Collection Time   11/19/10  5:15 PM      Component Value Range   Neutrophils Relative 34 (*) 43 - 77 (%)   Neutro Abs 3.0  1.7 - 7.7 (K/uL)   Lymphocytes Relative 56 (*) 12 - 46 (%)   Lymphs Abs 5.0 (*) 0.7 - 4.0 (K/uL)   Monocytes Relative 7  3 - 12 (%)   Monocytes Absolute 0.7  0.1 - 1.0 (K/uL)   Eosinophils Relative 3  0 - 5 (%)   Eosinophils Absolute 0.3  0.0 - 0.7 (K/uL)   Basophils Relative 0  0 - 1 (%)   Basophils Absolute 0.0  0.0 - 0.1 (K/uL)  COMPREHENSIVE METABOLIC PANEL     Status: Abnormal   Collection Time   11/19/10  5:15 PM       Component Value Range   Sodium 135  135 - 145 (mEq/L)   Potassium 5.5 (*) 3.5 - 5.1 (mEq/L)   Chloride 103  96 - 112 (mEq/L)   CO2 15 (*) 19 - 32 (mEq/L)   Glucose, Bld 211 (*) 70 - 99 (mg/dL)   BUN 25 (*) 6 - 23 (mg/dL)   Creatinine, Ser 2.13 (*) 0.50 - 1.35 (mg/dL)   Calcium 9.9  8.4 - 08.6 (mg/dL)   Total Protein 7.9  6.0 - 8.3 (g/dL)   Albumin 3.4 (*) 3.5 - 5.2 (g/dL)   AST 32  0 - 37 (U/L)   ALT 25  0 - 53 (U/L)   Alkaline Phosphatase 73  39 - 117 (U/L)   Total Bilirubin 0.4  0.3 - 1.2 (mg/dL)   GFR calc non Af Amer 54 (*) >90 (mL/min)   GFR calc Af Amer 62 (*) >90 (mL/min)  ETHANOL     Status: Normal   Collection Time   11/19/10  5:15 PM      Component Value Range   Alcohol, Ethyl (B) <11  0 - 11 (mg/dL)  CK TOTAL AND CKMB     Status: Normal   Collection Time   11/19/10  5:16 PM      Component Value Range   Total CK 117  7 - 232 (U/L)   CK, MB 2.5  0.3 - 4.0 (ng/mL)   Relative Index 2.1  0.0 - 2.5   TROPONIN I     Status: Normal   Collection Time   11/19/10  5:16 PM      Component Value Range   Troponin I <0.30  <0.30 (ng/mL)  GLUCOSE, CAPILLARY     Status: Abnormal   Collection Time   11/19/10  5:17 PM      Component Value Range   Glucose-Capillary 201 (*) 70 - 99 (mg/dL)  MRSA PCR SCREENING     Status: Normal   Collection Time   11/19/10  9:04 PM      Component Value Range   MRSA by PCR NEGATIVE  NEGATIVE   BLOOD GAS, ARTERIAL     Status: Normal   Collection Time   11/19/10  9:26 PM  Component Value Range   FIO2 .60     Delivery systems VENTILATOR     Mode PRESSURE REGULATED VOLUME CONTROL     VT 560     Rate 12     Peep/cpap 5.0     pH, Arterial 7.396  7.350 - 7.450    pCO2 arterial 37.2  35.0 - 45.0 (mmHg)   pO2, Arterial 97.2  80.0 - 100.0 (mmHg)   Bicarbonate 22.4  20.0 - 24.0 (mEq/L)   TCO2 23.5  0 - 100 (mmol/L)   Acid-base deficit 1.8  0.0 - 2.0 (mmol/L)   O2 Saturation 97.0     Patient temperature 98.4     Collection site RIGHT RADIAL      Drawn by [phone]     Sample type ARTERIAL DRAW     Allens test (pass/fail) PASS  PASS   CARDIAC PANEL(CRET KIN+CKTOT+MB+TROPI)     Status: Abnormal   Collection Time   11/20/10 12:04 AM      Component Value Range   Total CK 100  7 - 232 (U/L)   CK, MB 2.7  0.3 - 4.0 (ng/mL)   Troponin I <0.30  <0.30 (ng/mL)   Relative Index 2.7 (*) 0.0 - 2.5   GLUCOSE, CAPILLARY     Status: Abnormal   Collection Time   11/20/10 12:34 AM      Component Value Range   Glucose-Capillary 133 (*) 70 - 99 (mg/dL)  GLUCOSE, CAPILLARY     Status: Abnormal   Collection Time   11/20/10  3:43 AM      Component Value Range   Glucose-Capillary 128 (*) 70 - 99 (mg/dL)  BASIC METABOLIC PANEL     Status: Abnormal   Collection Time   11/20/10  4:00 AM      Component Value Range   Sodium 134 (*) 135 - 145 (mEq/L)   Potassium 3.5  3.5 - 5.1 (mEq/L)   Chloride 102  96 - 112 (mEq/L)   CO2 22  19 - 32 (mEq/L)   Glucose, Bld 127 (*) 70 - 99 (mg/dL)   BUN 23  6 - 23 (mg/dL)   Creatinine, Ser 0.45 (*) 0.50 - 1.35 (mg/dL)   Calcium 9.7  8.4 - 40.9 (mg/dL)   GFR calc non Af Amer 55 (*) >90 (mL/min)   GFR calc Af Amer 64 (*) >90 (mL/min)  HEPARIN LEVEL     Status: Abnormal   Collection Time   11/20/10  4:00 AM      Component Value Range   Heparin Unfractionated <0.10 (*) 0.30 - 0.70 (IU/mL)  CBC     Status: Abnormal   Collection Time   11/20/10  4:00 AM      Component Value Range   WBC 6.4  4.0 - 10.5 (K/uL)   RBC 4.05 (*) 4.22 - 5.81 (MIL/uL)   Hemoglobin 11.8 (*) 13.0 - 17.0 (g/dL)   HCT 81.1 (*) 91.4 - 52.0 (%)   MCV 87.7  78.0 - 100.0 (fL)   MCH 29.1  26.0 - 34.0 (pg)   MCHC 33.2  30.0 - 36.0 (g/dL)   RDW 78.2  95.6 - 21.3 (%)   Platelets 235  150 - 400 (K/uL)  BLOOD GAS, ARTERIAL     Status: Abnormal   Collection Time   11/20/10  4:15 AM      Component Value Range   FIO2 .50     Delivery systems VENTILATOR     Mode PRESSURE REGULATED VOLUME CONTROL  VT 560     Rate 12     Peep/cpap 5.0     pH,  Arterial 7.379  7.350 - 7.450    pCO2 arterial 40.1  35.0 - 45.0 (mmHg)   pO2, Arterial 126.0 (*) 80.0 - 100.0 (mmHg)   Bicarbonate 23.1  20.0 - 24.0 (mEq/L)   TCO2 24.3  0 - 100 (mmol/L)   Acid-base deficit 1.3  0.0 - 2.0 (mmol/L)   O2 Saturation 98.7     Patient temperature 98.6     Collection site RIGHT RADIAL     Drawn by [phone]     Sample type ARTERIAL DRAW     Allens test (pass/fail) PASS  PASS   GLUCOSE, CAPILLARY     Status: Abnormal   Collection Time   11/20/10  7:41 AM      Component Value Range   Glucose-Capillary 124 (*) 70 - 99 (mg/dL)  physical exam reveals a middle-aged [place] American gentleman who is intubated.  Afebrile. Pulse rate 75 per minute it is ablation. As noted it in per minute. Blood pressure 135/74.  Current exam irregular heart sounds no murmur or gallop. Lungs clear to auscultation with few crackles at the bases.  Abdomen soft nontender.  Neurological exam he is awake alert follows commands well. Eye moments are full range without nystagmus. He blinks to threat bilaterally. There is no facial weakness. Tongue is midline. There is no upper or lower extremity drift. There is mild fine finger movement weakness on the left hand and leg. Finger-to-nose and knee to heel coordination are slow on the left compared to the right. He has subjective sensation preserved bilaterally. Both plantars are downgoing. Gait could not tested.  CTA chest: no PE CT head old left cerebellar small infarct. No acute infarct. CTA head:  Moderate proximal basilar stenosis with preserved distal flow. Moderate stenosis of the right vertebral artery origin. Moderate stenosis of the distal nondominant left vertebral artery. No significant carotid bifurcation stenosis on either side.  Assessment: [name] is an 55 y.o. year old male with left-sided weakness and dizziness likely a right hemispheric TIA  versus a small right brain nfarct of cardioembolic etiology secondary to atrial  fibrillation with suboptimal anticoagulation on Coumadin with INR of 1.7 on admission. CT angiogram shows a closer posterior circulation disease which may not be related to his symptoms. Pulmonary edema and respiratory failure requiring intubation          . Hospital day # 1.  Treatment/Plan: Extubate if ok with ccm. Check MRI scan of the brain to evaluate for infarct. Continue IV heparin the patient and resume warfarin when he  is able to swallow. Warfarin anticoagulation  with target INR goal 2-3. Check echocardiogram, fasting lipid profile and hemoglobin A1c. Physical, occupational and speech therapy consults. We'll continue to follow this patient and call for questions.Discussed with Dr Earley Favor, ANP-BC, GNP-BC,  [facility] 11/8/20128:37 AM

## 2010-11-20 NOTE — Progress Notes (Signed)
ANTICOAGULATION CONSULT NOTE - Follow Up Consult  Pharmacy Consult for Heparin Indication: atrial fibrillation and stroke  No Known Allergies  Patient Measurements: Height: 5\' 9"  (175.3 cm) Weight: 231 lb 7.7 oz (105 kg) IBW/kg (Calculated) : 70.7  Adjusted Body Weight:   Vital Signs: Temp: 97.6 F (36.4 C) (11/08 1249) Temp src: Rectal (11/08 1249) BP: 128/101 mmHg (11/08 1500) Pulse Rate: 74  (11/08 1500)  Labs:  Basename 11/20/10 1543 11/20/10 1109 11/20/10 0400 11/20/10 0004 11/19/10 1716 11/19/10 1715 11/19/10 1712 11/19/10 1709  HGB -- -- 11.8* -- -- 13.7 -- --  HCT -- -- 35.5* -- -- 40.5 40.0 --  PLT -- -- 235 -- -- 254 -- --  APTT -- -- -- -- -- -- -- 26  LABPROT 24.9* -- -- -- -- -- -- 21.0*  INR 2.21* -- -- -- -- -- -- 1.78*  HEPARINUNFRC <0.10* -- <0.10* -- -- -- -- --  CREATININE -- -- 1.39* -- -- 1.42* 1.50* --  CKTOTAL -- 107 -- 100 117 -- -- --  CKMB -- 2.9 -- 2.7 2.5 -- -- --  TROPONINI -- <0.30 -- <0.30 <0.30 -- -- --   Estimated Creatinine Clearance: 70.8 ml/min (by C-G formula based on Cr of 1.39).   Medications:  Scheduled:    . sodium chloride   Intravenous Once  . amLODipine  5 mg Oral Daily  . azithromycin  500 mg Intravenous Q24H  . carvedilol  25 mg Oral BID WC  . cefTRIAXone (ROCEPHIN) IV  1 g Intravenous Q24H  . chlorhexidine      . cloNIDine  0.1 mg Oral BID  . etomidate      . famotidine (PEPCID) IV  20 mg Intravenous Q12H  . fentaNYL  100 mcg Intravenous Once  . furosemide  40 mg Intravenous Q12H  . insulin aspart  0-4 Units Subcutaneous Q4H  . isosorbide-hydrALAZINE  1 tablet Oral TID  . lidocaine (cardiac) 100 mg/28ml      . pneumococcal 23 valent vaccine  0.5 mL Intramuscular Tomorrow-1000  . ramipril  10 mg Oral Daily  . rocuronium      . spironolactone  50 mg Oral Daily  . succinylcholine      . DISCONTD: cefTRIAXone (ROCEPHIN) IM  1 g Intramuscular Q24H  . DISCONTD: furosemide  40 mg Intravenous Q8H  . DISCONTD: heparin   5,000 Units Subcutaneous Q8H   Infusions:    . sodium chloride 10 mL/hr at 11/20/10 1500  . dextrose    . heparin 14 mL/hr (11/20/10 1500)  . DISCONTD: heparin 12 Units/kg/hr (11/20/10 0600)  . DISCONTD: propofol 20 mcg/kg/min (11/19/10 1925)  . DISCONTD: propofol Stopped (11/20/10 0746)    Assessment: Pt is on heparin for afib/CVA. His INR is >2 today. His heparin level remains subtherapeutic. We will maintaining him on heparin since his coumadin is on hold. Goal of Therapy:  0.3-0.5   Plan:  Increase heparin drip to 1700 units/hr Check 6hr heparin level   Ulyses Southward [place] 11/20/2010,4:34 PM

## 2010-11-21 ENCOUNTER — Inpatient Hospital Stay (HOSPITAL_COMMUNITY): Payer: 59

## 2010-11-21 DIAGNOSIS — E785 Hyperlipidemia, unspecified: Secondary | ICD-10-CM | POA: Diagnosis present

## 2010-11-21 DIAGNOSIS — I6340 Cerebral infarction due to embolism of unspecified cerebral artery: Secondary | ICD-10-CM

## 2010-11-21 HISTORY — DX: Hyperlipidemia, unspecified: E78.5

## 2010-11-21 LAB — BASIC METABOLIC PANEL
CO2: 26 mEq/L (ref 19–32)
Calcium: 9.8 mg/dL (ref 8.4–10.5)
Calcium: 10.6 mg/dL — ABNORMAL HIGH (ref 8.4–10.5)
Chloride: 98 mEq/L (ref 96–112)
Creatinine, Ser: 1.34 mg/dL (ref 0.50–1.35)
GFR calc Af Amer: 52 mL/min — ABNORMAL LOW (ref >90)
GFR calc Af Amer: 67 mL/min — ABNORMAL LOW (ref >90)
GFR calc non Af Amer: 45 mL/min — ABNORMAL LOW (ref >90)
Sodium: 140 mEq/L (ref 135–145)

## 2010-11-21 LAB — BLOOD GAS, ARTERIAL
Acid-Base Excess: 2.6 mmol/L — ABNORMAL HIGH (ref 0.0–2.0)
Drawn by: 225631
FIO2: 0.21 %
FIO2: 0.28 %
O2 Saturation: 91.9 %
O2 Saturation: 97.1 %
Patient temperature: 98.6
TCO2: 26.7 mmol/L (ref 0–100)
pO2, Arterial: 81.9 mmHg (ref 80.0–100.0)

## 2010-11-21 LAB — HEPARIN LEVEL (UNFRACTIONATED): Heparin Unfractionated: 0.71 IU/mL — ABNORMAL HIGH (ref 0.30–0.70)

## 2010-11-21 LAB — GLUCOSE, CAPILLARY
Glucose-Capillary: 128 mg/dL — ABNORMAL HIGH (ref 70–99)
Glucose-Capillary: 141 mg/dL — ABNORMAL HIGH (ref 70–99)
Glucose-Capillary: 152 mg/dL — ABNORMAL HIGH (ref 70–99)
Glucose-Capillary: 184 mg/dL — ABNORMAL HIGH (ref 70–99)

## 2010-11-21 LAB — CBC
MCH: 29.6 pg (ref 26.0–34.0)
MCHC: 33.5 g/dL (ref 30.0–36.0)
Platelets: 248 10*3/uL (ref 150–400)
RBC: 4.23 MIL/uL (ref 4.22–5.81)

## 2010-11-21 LAB — PRO B NATRIURETIC PEPTIDE: Pro B Natriuretic peptide (BNP): 666 pg/mL — ABNORMAL HIGH (ref 0–125)

## 2010-11-21 MED ORDER — METOPROLOL TARTRATE 1 MG/ML IV SOLN
2.5000 mg | INTRAVENOUS | Status: DC | PRN
  Administered 2010-11-21 – 2010-11-23 (×3): 5 mg via INTRAVENOUS
  Administered 2010-11-24: 2.5 mg via INTRAVENOUS
  Administered 2010-11-25: 5 mg via INTRAVENOUS
  Administered 2010-11-25 (×2): 2.5 mg via INTRAVENOUS
  Administered 2010-11-26 – 2010-11-27 (×3): 5 mg via INTRAVENOUS
  Administered 2010-11-28: 2.5 mg via INTRAVENOUS
  Administered 2010-11-28 – 2010-12-01 (×4): 5 mg via INTRAVENOUS
  Administered 2010-12-02 (×2): 2.5 mg via INTRAVENOUS
  Administered 2010-12-07 – 2010-12-14 (×11): 5 mg via INTRAVENOUS
  Administered 2010-12-14: 2.5 mg via INTRAVENOUS
  Administered 2010-12-15 – 2010-12-16 (×4): 5 mg via INTRAVENOUS
  Filled 2010-11-21 (×35): qty 5

## 2010-11-21 MED ORDER — INSULIN ASPART 100 UNIT/ML ~~LOC~~ SOLN
0.0000 [IU] | SUBCUTANEOUS | Status: DC
  Administered 2010-11-21: 1 [IU] via SUBCUTANEOUS
  Administered 2010-11-21 – 2010-11-23 (×11): 3 [IU] via SUBCUTANEOUS
  Administered 2010-11-23: 1 [IU] via SUBCUTANEOUS
  Administered 2010-11-23: 2 [IU] via SUBCUTANEOUS
  Administered 2010-11-24 – 2010-11-25 (×10): 3 [IU] via SUBCUTANEOUS
  Administered 2010-11-25: 4 [IU] via SUBCUTANEOUS
  Administered 2010-11-25 (×2): 3 [IU] via SUBCUTANEOUS
  Administered 2010-11-26: 4 [IU] via SUBCUTANEOUS
  Administered 2010-11-26 (×2): 3 [IU] via SUBCUTANEOUS
  Filled 2010-11-21: qty 3

## 2010-11-21 MED ORDER — DEXTROSE 10 % IV SOLN: INTRAVENOUS | Status: DC

## 2010-11-21 MED ORDER — LABETALOL HCL 5 MG/ML IV SOLN
10.0000 mg | INTRAVENOUS | Status: DC | PRN
  Administered 2010-11-23 – 2010-11-26 (×5): 10 mg via INTRAVENOUS
  Filled 2010-11-21 (×5): qty 4

## 2010-11-21 MED ORDER — LABETALOL HCL 5 MG/ML IV SOLN: 20.0000 mg | INTRAVENOUS | Status: DC | PRN

## 2010-11-21 MED ORDER — SIMVASTATIN 20 MG PO TABS
20.0000 mg | ORAL_TABLET | Freq: Every day | ORAL | Status: DC
  Administered 2010-11-22 – 2010-11-27 (×6): 20 mg via ORAL
  Filled 2010-11-21 (×8): qty 1

## 2010-11-21 MED ORDER — FUROSEMIDE 10 MG/ML IJ SOLN
40.0000 mg | Freq: Every day | INTRAMUSCULAR | Status: DC
  Administered 2010-11-21 – 2010-11-24 (×4): 40 mg via INTRAVENOUS
  Filled 2010-11-21 (×4): qty 4

## 2010-11-21 MED ORDER — ACETAMINOPHEN 650 MG RE SUPP
650.0000 mg | Freq: Four times a day (QID) | RECTAL | Status: DC | PRN
  Administered 2010-11-23: 650 mg via RECTAL
  Filled 2010-11-21: qty 1

## 2010-11-21 MED ORDER — NICARDIPINE HCL IN NACL 20-0.86 MG/200ML-% IV SOLN
5.0000 mg/h | INTRAVENOUS | Status: DC
  Administered 2010-11-21: 6.5 mg/h via INTRAVENOUS
  Administered 2010-11-22: 7 mg/h via INTRAVENOUS
  Administered 2010-11-22: 8 mg/h via INTRAVENOUS
  Administered 2010-11-24 (×3): 5 mg/h via INTRAVENOUS
  Filled 2010-11-21 (×21): qty 200

## 2010-11-21 MED ORDER — NICARDIPINE HCL IN NACL 20-0.86 MG/200ML-% IV SOLN: 15.0000 mg/h | INTRAVENOUS | Status: DC

## 2010-11-21 MED ORDER — NICARDIPINE HCL IN NACL 20-0.86 MG/200ML-% IV SOLN
5.0000 mg/h | INTRAVENOUS | Status: DC
  Administered 2010-11-21: 5 mg/h via INTRAVENOUS
  Filled 2010-11-21: qty 200

## 2010-11-21 MED ORDER — SODIUM CHLORIDE 0.9 % IV SOLN
INTRAVENOUS | Status: DC
  Filled 2010-11-21: qty 1

## 2010-11-21 MED ORDER — RAMIPRIL 10 MG PO TABS
10.0000 mg | ORAL_TABLET | Freq: Two times a day (BID) | ORAL | Status: DC
  Administered 2010-11-22: 10 mg via ORAL
  Filled 2010-11-21 (×3): qty 1

## 2010-11-21 MED ORDER — INSULIN ASPART 100 UNIT/ML ~~LOC~~ SOLN: 0.0000 [IU] | SUBCUTANEOUS | Status: DC

## 2010-11-21 MED FILL — Ramipril Cap 10 MG: ORAL | Qty: 1 | Status: AC

## 2010-11-21 NOTE — Progress Notes (Signed)
11/21/2010: Pt. Extubated per order by Dr. Craige Cotta @ 10:15, to 2lpm n/c, tolerating well, no Stridor with b/l b.s. Clear/=, RN @ bedside, RT to monitor. (Was able to breath around deflated cuff, hold head off bed x 10 sec., following commands purposefully, in sitting position, VC > 1L, RSBI:30-40, AMBU bag @ bedside, airway suctioned prior to extubation, pre-oxygenation with 100% with PS increased to 10 for increased volumes prior to Extubation.)

## 2010-11-21 NOTE — Progress Notes (Signed)
OT Cancellation Note  *Treatment cancelled today due to medical issues with patient which prohibited therapy: patient intubated at this point. Will await extubation.  11/21/2010 Darcy Cordner   OTR/L Pager: [phone]

## 2010-11-21 NOTE — Progress Notes (Signed)
Speech Language/Pathology Clinical/Bedside Swallow Evaluation Patient Details  Name: [name] MRN: [id number] DOB: Aug 11, 1955 Today's Date: 11/21/2010   Assessment/Recommendations/Treatment Plan  Pt with infarct including cerebellum, midbrain and pons. Intubated in ED due to poor airway protection, CHF exacerbation. Extubated in am today, tolerating well per RN.   SLP Assessment Clinical Impression Statement: No overt s/s of aspiration observed at bedside, however clinical implications of poor respiratory status, weak cough, dysphonia, stridorous inhalation, intubation and pontine CVA make risk for silent aspiration significant in this patient. Objective test warranted to determine safety with POs prior to initiation.  Risk for Aspiration: Moderate Other Related Risk Factors: Decreased respiratory status  Recommendations Recommended Consults: MBS General Recommendation: NPO Oral Care Recommendations: Oral care QID  Prognosis Prognosis for Safe Diet Advancement: Good  Individuals Consulted Consulted and Agree with Results and Recommendations: Patient    Claudine Mouton 11/21/2010,4:21 PM

## 2010-11-21 NOTE — Progress Notes (Addendum)
ANTICOAGULATION CONSULT NOTE - Follow Up Consult  Pharmacy Consult for [facility] Indication: afib  No Known Allergies  Patient Measurements: Height: 5\' 9"  (175.3 cm) Weight: 218 lb 11.1 oz (99.2 kg) IBW/kg (Calculated) : 70.7    Vital Signs: Temp: 98.8 F (37.1 C) (11/09 0800) Temp src: Oral (11/09 0800) BP: 164/107 mmHg (11/09 1300) Pulse Rate: 88  (11/09 1300)  Labs:  Basename 11/21/10 0933 11/21/10 0330 11/20/10 2349 11/20/10 1543 11/20/10 1542 11/20/10 1109 11/20/10 0400 11/20/10 0004 11/19/10 1715 11/19/10 1709  HGB -- 12.5* -- -- -- -- 11.8* -- -- --  HCT -- 37.3* -- -- -- -- 35.5* -- 40.5 --  PLT -- 248 -- -- -- -- 235 -- 254 --  APTT -- -- -- -- -- -- -- -- -- 26  LABPROT -- -- -- 24.9* -- -- -- -- -- 21.0*  INR -- -- -- 2.21* -- -- -- -- -- 1.78*  HEPARINUNFRC 0.39 0.52 0.71* -- -- -- -- -- -- --  CREATININE -- 1.66* -- -- -- -- 1.39* -- 1.42* --  CKTOTAL -- -- -- -- 95 107 -- 100 -- --  CKMB -- -- -- -- 2.2 2.9 -- 2.7 -- --  TROPONINI -- -- -- -- <0.30 <0.30 -- <0.30 -- --   Estimated Creatinine Clearance: 57.7 ml/min (by C-G formula based on Cr of 1.66).   Medications:  Scheduled:    . amLODipine  5 mg Oral Daily  . antiseptic oral rinse  15 mL Mouth Rinse QID  . azithromycin  500 mg Intravenous Q24H  . carvedilol  25 mg Oral BID WC  . cefTRIAXone (ROCEPHIN) IV  1 g Intravenous Q24H  . chlorhexidine  15 mL Mouth Rinse BID  . cloNIDine  0.1 mg Oral BID  . famotidine (PEPCID) IV  20 mg Intravenous Q12H  . furosemide  40 mg Intravenous Daily  . influenza (>/= 3 years) inactive virus vaccine  0.5 mL Intramuscular Tomorrow-1000  . insulin aspart  0-4 Units Subcutaneous Q4H  . isosorbide-hydrALAZINE  1 tablet Oral TID  . pneumococcal 23 valent vaccine  0.5 mL Intramuscular Tomorrow-1000  . ramipril  10 mg Oral q12n4p  . simvastatin  20 mg Oral q1800  . spironolactone  50 mg Oral Daily  . DISCONTD: fentaNYL  100 mcg Intravenous Once  . DISCONTD: furosemide  40  mg Intravenous Q12H  . DISCONTD: ramipril  10 mg Oral Daily    Assessment: 55 y/o male patient on chronic coumadin for h/o afib. Receiving IV heparin to bridge. INR therapeutic yesterday. No INR reported today. Can give coumadin IV if needed. Heparin level therapeutic.  Goal of Therapy:  Xa level=0.3-0.5   Plan:  Continue heparin gtt at 1700 units/hr, f/u in am.  Leodis Binet, Wilfredo Canterbury M 11/21/2010,1:33 PM

## 2010-11-21 NOTE — Progress Notes (Signed)
eLink Physician-Brief Progress Note Patient Name: [name] DOB: 29-Nov-1955 MRN: [id number]  Date of Service  11/21/2010   HPI/Events of Note  Called earlier by bedside RN.  The patient had altered mental.  CBG and ABG ok.  Head CT with hydrocephalus as per radiology.  Fellow sent up to evaluate airway protection and neurology (Dr. Threasa Beards) to evaluate for hydrocephalus and make recommendations.  eICU Interventions  High: AMS.   Intervention Category Intermediate Interventions: Communication with other healthcare providers and/or family  Berish Bohman 11/21/2010, 8:23 PM

## 2010-11-21 NOTE — Progress Notes (Signed)
Patient extubated at 1015.  Patient tolerated procedure well and placed on 4L [place].  Vitals are recorded at the time of extubation.  Will continue to monitor patient for signs of respiratory distress.  [name]

## 2010-11-21 NOTE — Progress Notes (Addendum)
[name] is a 55 y.o. male former smoker admitted on 11/19/2010 with acute onset of slurred speech and Lt sided weakness.  Intubated in ED for airway protection and pulmonary edema.  Had spontaneous resolution of neuro symptoms. PMHx DM, CAD, CHF, HTN, Gout, A fib  Line/tubes: ETT 11/7>>11/9  Abx: Rocephin 11/8>> Zithromax 11/8>>  Cx: Blood 11/8>> Sputum 11/8>>  Best practice: Pepcid Heparin gtt/coumadin for A fib  Consults: Neuro Cariology [name])  Tests/events: 11/7: CT head>>negative 11/7: CT chest>>b/l GGO more at bases, small b/l effusions, no PE, dependent ATX 11/8: MRI head w/o contrast>>Acute/subacute non-hemorrhagic infarct Rt superior cerebellar artery territory, remote Lt cerebellar infarct, multiple punctated areas of remote hemorrhage compatible with amyloid angiopathy 11/8: Echo>>severe LVH, EF 25 to 35%, mild AR, mild MR, PAS  SUBJECTIVE: Awake, follows commands, tolerating SBT  OBJECTIVE:  Blood pressure 176/142, pulse 85, temperature 98.7 F (37.1 C), temperature source Axillary, resp. rate 18, height 5\' 9"  (1.753 m), weight 218 lb 11.1 oz (99.2 kg), SpO2 100.00%.   Intake/Output Summary (Last 24 hours) at 11/21/10 1000 Last data filed at 11/21/10 0900  Gross per 24 hour  Intake 777.75 ml  Output   2385 ml  Net -1607.25 ml   General - obese, no distress HEENT - Pupils reactive Cardiac - irregular Chest - scattered rhonchi Abd - soft, non-tender Ext - no edema Neuro - alert, follows commands  Lab Results  Component Value Date   CREATININE 1.66* 11/21/2010   BUN 21 11/21/2010   NA 140 11/21/2010   K 3.6 11/21/2010   CL 101 11/21/2010   CO2 26 11/21/2010   CBC    Component Value Date/Time   WBC 8.4 11/21/2010 0330   RBC 4.23 11/21/2010 0330   HGB 12.5* 11/21/2010 0330   HCT 37.3* 11/21/2010 0330   PLT 248 11/21/2010 0330   MCV 88.2 11/21/2010 0330   MCH 29.6 11/21/2010 0330   MCHC 33.5 11/21/2010 0330   RDW 15.0 11/21/2010 0330   LYMPHSABS 5.0* 11/19/2010 1715   MONOABS 0.7 11/19/2010 1715   EOSABS 0.3 11/19/2010 1715   BASOSABS 0.0 11/19/2010 1715   Lab Results  Component Value Date   TSH 0.895 11/20/2010   FreeT4>>1.82 11/20/2010  11/20/2010  *RADIOLOGY REPORT*  Clinical Data: Pulmonary edema.  PORTABLE CHEST - 1 VIEW  Comparison: 11/19/2010,  Findings: A nasogastric tube has been placed and the tip is located below the level of the hemidiaphragms but not visible on this exam. The endotracheal tube is stable in position.  Cardiomegaly is again identified and stable in degree. The mediastinal contour is unchanged.  There has been an interval increase in bibasilar density since the previous exam. Prominence of the minor fissure suggests a component of this density on the right may be related to posteriorly layering pleural fluid. Basilar density may be the result of basilar alveolar edema although pneumonia is not excluded with this appearance.  IMPRESSION: Increasing bibasilar density with suspicion of some right pleural fluid and fissural fluid.  Question basilar alveolar edema versus pneumonia.  Original Report Authenticated By: Bertha Stakes, M.D.   ASSESSMENT/PLAN:  Encephalopathy with cerebellar infarct -will need swallow eval after extubation -appreciate help from neuro  Acute respiratory failure 2nd to pulmonary edema with VDRF improved -proceed with extubation 11/9>>will need to monitor airway after extubation; d/w pt and he would be agreeable to re-intubation and trach if he fails extubation -f/u CXRintermittently  Hypertensive emergency with acute systolic and diastolic dysfunction leading to  pulmonary edema -continue home regimen of amlodipine, carvedilol, clonidine, bidil, ramipril, spironolactone -continue lasix to keep in negative fluid balance -will consult cardiology>>Dr. [name] called 11/09  A. Fib -continue heparin and have pharmacy dose coumadin when able to take oral medications  Hx of  CAD -continue ASA, carvedilol  DM -SSI -hold outpt regimen of metformin since he had CT with IV contrast  Gout -will need to resume allopurinol, colchicine when able to take oral medication  Rt lower ASD with ?PNA -d2/x rocephin, zithromax -check blood cx, sputum culture  ?Thyroiditis -CT images d/w radiology with concerns for thyroid inflammation -f/u mild elevation in FT4, and low normal TSH>>will monitor for now  Disposition -would NOT transfer out of ICU over weekend>>need to monitor airway closely after extubation.  Critical care time 40 minutes.  Keoshia Steinmetz Pager:  [phone] 11/21/2010, 10:00 AM

## 2010-11-21 NOTE — Progress Notes (Signed)
Pt in mri 

## 2010-11-21 NOTE — Consult Note (Signed)
Reason for Consult: Decompensated systolic heart failure Referring Physician: Dr. Babs Sciara is an 55 y.o. male.  HPI: Patient is 55 year old black male with past medical history significant for multiple medical problems he chronic atrial fibrillation nonischemic dilated cardiomyopathy with EF of 25-30% noncompliance to medication last seen in my office in February of 2012 hypertension history of congestive heart failure in the past secondary to systolic dysfunction diabetes mellitus hypercholesteremia history of gouty arthritis mild renal insufficiency was admitted on 11/18/2010 because of left-sided weakness and was noted to be in acute pulmonary edema with a hypertensive urgency requiring intubation. Patient also had slurred speech which has improved since admission. Patient was successfully extubated. Patient had 2-D echo done a few days ago that showed global hypokinesia with EF of 25-30%. Cardiologic consultation is obtained for further management of congestive heart failure. Patient's INR at the time of admission was subtherapeutic and MRI of the brain showed cerebral and acute infarct which is felt to be cardioembolic patient presently is awake denies any chest pain pressure or tightness denies any shortness of breath denies any palpitations. Patient then denies any weakness in the arms or legs.  Past Medical History  Diagnosis Date  . Diabetes mellitus   . Coronary artery disease   . Hypertension   . Gout   . CHF (congestive heart failure)   . Afib   . Arthritis     History reviewed. No pertinent past surgical history.  History reviewed. No pertinent family history.  Social History:  reports that he quit smoking about 4 years ago. His smoking use included Cigarettes. He does not have any smokeless tobacco history on file. He reports that he drinks about 6.6 ounces of alcohol per week. He reports that he does not use illicit drugs.  Allergies: No Known  Allergies  Medications: I have reviewed the patient's current medications.  Results for orders placed during the hospital encounter of 11/19/10 (from the past 48 hour(s))  PROTIME-INR     Status: Abnormal   Collection Time   11/19/10  5:09 PM      Component Value Range Comment   Prothrombin Time 21.0 (*) 11.6 - 15.2 (seconds)    INR 1.78 (*) 0.00 - 1.49    APTT     Status: Normal   Collection Time   11/19/10  5:09 PM      Component Value Range Comment   aPTT 26  24 - 37 (seconds)   POCT I-STAT, CHEM 8     Status: Abnormal   Collection Time   11/19/10  5:12 PM      Component Value Range Comment   Sodium 136  135 - 145 (mEq/L)    Potassium 4.2  3.5 - 5.1 (mEq/L)    Chloride 107  96 - 112 (mEq/L)    BUN 28 (*) 6 - 23 (mg/dL)    Creatinine, Ser 4.69 (*) 0.50 - 1.35 (mg/dL)    Glucose, Bld 629 (*) 70 - 99 (mg/dL)    Calcium, Ion 5.28  1.12 - 1.32 (mmol/L)    TCO2 23  0 - 100 (mmol/L)    Hemoglobin 13.6  13.0 - 17.0 (g/dL)    HCT 41.3  24.4 - 01.0 (%)   CBC     Status: Normal   Collection Time   11/19/10  5:15 PM      Component Value Range Comment   WBC 8.9  4.0 - 10.5 (K/uL)    RBC 4.60  4.22 -  5.81 (MIL/uL)    Hemoglobin 13.7  13.0 - 17.0 (g/dL)    HCT 16.1  09.6 - 04.5 (%)    MCV 88.0  78.0 - 100.0 (fL)    MCH 29.8  26.0 - 34.0 (pg)    MCHC 33.8  30.0 - 36.0 (g/dL)    RDW 40.9  81.1 - 91.4 (%)    Platelets 254  150 - 400 (K/uL)   DIFFERENTIAL     Status: Abnormal   Collection Time   11/19/10  5:15 PM      Component Value Range Comment   Neutrophils Relative 34 (*) 43 - 77 (%)    Neutro Abs 3.0  1.7 - 7.7 (K/uL)    Lymphocytes Relative 56 (*) 12 - 46 (%)    Lymphs Abs 5.0 (*) 0.7 - 4.0 (K/uL)    Monocytes Relative 7  3 - 12 (%)    Monocytes Absolute 0.7  0.1 - 1.0 (K/uL)    Eosinophils Relative 3  0 - 5 (%)    Eosinophils Absolute 0.3  0.0 - 0.7 (K/uL)    Basophils Relative 0  0 - 1 (%)    Basophils Absolute 0.0  0.0 - 0.1 (K/uL)   COMPREHENSIVE METABOLIC PANEL      Status: Abnormal   Collection Time   11/19/10  5:15 PM      Component Value Range Comment   Sodium 135  135 - 145 (mEq/L)    Potassium 5.5 (*) 3.5 - 5.1 (mEq/L) HEMOLYSIS AT THIS LEVEL MAY AFFECT RESULT   Chloride 103  96 - 112 (mEq/L)    CO2 15 (*) 19 - 32 (mEq/L)    Glucose, Bld 211 (*) 70 - 99 (mg/dL)    BUN 25 (*) 6 - 23 (mg/dL)    Creatinine, Ser 7.82 (*) 0.50 - 1.35 (mg/dL)    Calcium 9.9  8.4 - 10.5 (mg/dL)    Total Protein 7.9  6.0 - 8.3 (g/dL)    Albumin 3.4 (*) 3.5 - 5.2 (g/dL)    AST 32  0 - 37 (U/L) HEMOLYSIS AT THIS LEVEL MAY AFFECT RESULT   ALT 25  0 - 53 (U/L)    Alkaline Phosphatase 73  39 - 117 (U/L)    Total Bilirubin 0.4  0.3 - 1.2 (mg/dL)    GFR calc non Af Amer 54 (*) >90 (mL/min)    GFR calc Af Amer 62 (*) >90 (mL/min)   ETHANOL     Status: Normal   Collection Time   11/19/10  5:15 PM      Component Value Range Comment   Alcohol, Ethyl (B) <11  0 - 11 (mg/dL)   CK TOTAL AND CKMB     Status: Normal   Collection Time   11/19/10  5:16 PM      Component Value Range Comment   Total CK 117  7 - 232 (U/L)    CK, MB 2.5  0.3 - 4.0 (ng/mL)    Relative Index 2.1  0.0 - 2.5    TROPONIN I     Status: Normal   Collection Time   11/19/10  5:16 PM      Component Value Range Comment   Troponin I <0.30  <0.30 (ng/mL)   GLUCOSE, CAPILLARY     Status: Abnormal   Collection Time   11/19/10  5:17 PM      Component Value Range Comment   Glucose-Capillary 201 (*) 70 - 99 (mg/dL)   MRSA PCR  SCREENING     Status: Normal   Collection Time   11/19/10  9:04 PM      Component Value Range Comment   MRSA by PCR NEGATIVE  NEGATIVE    BLOOD GAS, ARTERIAL     Status: Normal   Collection Time   11/19/10  9:26 PM      Component Value Range Comment   FIO2 .60      Delivery systems VENTILATOR      Mode PRESSURE REGULATED VOLUME CONTROL      VT 560      Rate 12      Peep/cpap 5.0      pH, Arterial 7.396  7.350 - 7.450     pCO2 arterial 37.2  35.0 - 45.0 (mmHg)    pO2, Arterial 97.2   80.0 - 100.0 (mmHg)    Bicarbonate 22.4  20.0 - 24.0 (mEq/L)    TCO2 23.5  0 - 100 (mmol/L)    Acid-base deficit 1.8  0.0 - 2.0 (mmol/L)    O2 Saturation 97.0      Patient temperature 98.4      Collection site RIGHT RADIAL      Drawn by [phone]      Sample type ARTERIAL DRAW      Allens test (pass/fail) PASS  PASS    CARDIAC PANEL(CRET KIN+CKTOT+MB+TROPI)     Status: Abnormal   Collection Time   11/20/10 12:04 AM      Component Value Range Comment   Total CK 100  7 - 232 (U/L)    CK, MB 2.7  0.3 - 4.0 (ng/mL)    Troponin I <0.30  <0.30 (ng/mL)    Relative Index 2.7 (*) 0.0 - 2.5    GLUCOSE, CAPILLARY     Status: Abnormal   Collection Time   11/20/10 12:34 AM      Component Value Range Comment   Glucose-Capillary 133 (*) 70 - 99 (mg/dL)   GLUCOSE, CAPILLARY     Status: Abnormal   Collection Time   11/20/10  3:43 AM      Component Value Range Comment   Glucose-Capillary 128 (*) 70 - 99 (mg/dL)   BASIC METABOLIC PANEL     Status: Abnormal   Collection Time   11/20/10  4:00 AM      Component Value Range Comment   Sodium 134 (*) 135 - 145 (mEq/L)    Potassium 3.5  3.5 - 5.1 (mEq/L)    Chloride 102  96 - 112 (mEq/L)    CO2 22  19 - 32 (mEq/L)    Glucose, Bld 127 (*) 70 - 99 (mg/dL)    BUN 23  6 - 23 (mg/dL)    Creatinine, Ser 0.45 (*) 0.50 - 1.35 (mg/dL)    Calcium 9.7  8.4 - 10.5 (mg/dL)    GFR calc non Af Amer 55 (*) >90 (mL/min)    GFR calc Af Amer 64 (*) >90 (mL/min)   HEPARIN LEVEL     Status: Abnormal   Collection Time   11/20/10  4:00 AM      Component Value Range Comment   Heparin Unfractionated <0.10 (*) 0.30 - 0.70 (IU/mL)   CBC     Status: Abnormal   Collection Time   11/20/10  4:00 AM      Component Value Range Comment   WBC 6.4  4.0 - 10.5 (K/uL)    RBC 4.05 (*) 4.22 - 5.81 (MIL/uL)    Hemoglobin 11.8 (*) 13.0 -  17.0 (g/dL)    HCT 16.1 (*) 09.6 - 52.0 (%)    MCV 87.7  78.0 - 100.0 (fL)    MCH 29.1  26.0 - 34.0 (pg)    MCHC 33.2  30.0 - 36.0 (g/dL)    RDW  04.5  40.9 - 81.1 (%)    Platelets 235  150 - 400 (K/uL)   BLOOD GAS, ARTERIAL     Status: Abnormal   Collection Time   11/20/10  4:15 AM      Component Value Range Comment   FIO2 .50      Delivery systems VENTILATOR      Mode PRESSURE REGULATED VOLUME CONTROL      VT 560      Rate 12      Peep/cpap 5.0      pH, Arterial 7.379  7.350 - 7.450     pCO2 arterial 40.1  35.0 - 45.0 (mmHg)    pO2, Arterial 126.0 (*) 80.0 - 100.0 (mmHg)    Bicarbonate 23.1  20.0 - 24.0 (mEq/L)    TCO2 24.3  0 - 100 (mmol/L)    Acid-base deficit 1.3  0.0 - 2.0 (mmol/L)    O2 Saturation 98.7      Patient temperature 98.6      Collection site RIGHT RADIAL      Drawn by [phone]      Sample type ARTERIAL DRAW      Allens test (pass/fail) PASS  PASS    GLUCOSE, CAPILLARY     Status: Abnormal   Collection Time   11/20/10  7:41 AM      Component Value Range Comment   Glucose-Capillary 124 (*) 70 - 99 (mg/dL)   CULTURE, RESPIRATORY     Status: Normal (Preliminary result)   Collection Time   11/20/10  9:01 AM      Component Value Range Comment   Specimen Description TRACHEAL ASPIRATE      Special Requests NONE      Gram Stain        Value: RARE WBC PRESENT,BOTH PMN AND MONONUCLEAR     NO SQUAMOUS EPITHELIAL CELLS SEEN     FEW GRAM POSITIVE COCCI     IN PAIRS RARE GRAM POSITIVE RODS     RARE GRAM NEGATIVE COCCI   Culture Culture reincubated for better growth      Report Status PENDING     CULTURE, BLOOD (ROUTINE X 2)     Status: Normal (Preliminary result)   Collection Time   11/20/10 10:00 AM      Component Value Range Comment   Specimen Description BLOOD ARM LEFT      Special Requests BOTTLES DRAWN AEROBIC AND ANAEROBIC 10CC EACH      Setup Time [id number]      Culture        Value:        BLOOD CULTURE RECEIVED NO GROWTH TO DATE CULTURE WILL BE HELD FOR 5 DAYS BEFORE ISSUING A FINAL NEGATIVE REPORT   Report Status PENDING     CULTURE, BLOOD (ROUTINE X 2)     Status: Normal (Preliminary result)    Collection Time   11/20/10 10:20 AM      Component Value Range Comment   Specimen Description BLOOD HAND LEFT      Special Requests BOTTLES DRAWN AEROBIC ONLY 5.0 CC      Setup Time [id number]      Culture        Value:  BLOOD CULTURE RECEIVED NO GROWTH TO DATE CULTURE WILL BE HELD FOR 5 DAYS BEFORE ISSUING A FINAL NEGATIVE REPORT   Report Status PENDING     CARDIAC PANEL(CRET KIN+CKTOT+MB+TROPI)     Status: Abnormal   Collection Time   11/20/10 11:09 AM      Component Value Range Comment   Total CK 107  7 - 232 (U/L)    CK, MB 2.9  0.3 - 4.0 (ng/mL)    Troponin I <0.30  <0.30 (ng/mL)    Relative Index 2.7 (*) 0.0 - 2.5    TSH     Status: Normal   Collection Time   11/20/10 11:09 AM      Component Value Range Comment   TSH 0.895  0.350 - 4.500 (uIU/mL)   T4, FREE     Status: Abnormal   Collection Time   11/20/10 11:09 AM      Component Value Range Comment   Free T4 1.82 (*) 0.80 - 1.80 (ng/dL)   HEMOGLOBIN Z6X     Status: Abnormal   Collection Time   11/20/10 11:09 AM      Component Value Range Comment   Hemoglobin A1C 8.2 (*) <5.7 (%)    Mean Plasma Glucose 189 (*) <117 (mg/dL)   LIPID PANEL     Status: Abnormal   Collection Time   11/20/10 11:09 AM      Component Value Range Comment   Cholesterol 203 (*) 0 - 200 (mg/dL)    Triglycerides 096 (*) <150 (mg/dL)    HDL 51  >04 (mg/dL)    Total CHOL/HDL Ratio 4.0      VLDL 32  0 - 40 (mg/dL)    LDL Cholesterol 540 (*) 0 - 99 (mg/dL)   GLUCOSE, CAPILLARY     Status: Abnormal   Collection Time   11/20/10 11:28 AM      Component Value Range Comment   Glucose-Capillary 219 (*) 70 - 99 (mg/dL)   CARDIAC PANEL(CRET KIN+CKTOT+MB+TROPI)     Status: Normal   Collection Time   11/20/10  3:42 PM      Component Value Range Comment   Total CK 95  7 - 232 (U/L)    CK, MB 2.2  0.3 - 4.0 (ng/mL)    Troponin I <0.30  <0.30 (ng/mL)    Relative Index RELATIVE INDEX IS INVALID  0.0 - 2.5    HEPARIN LEVEL     Status: Abnormal    Collection Time   11/20/10  3:43 PM      Component Value Range Comment   Heparin Unfractionated <0.10 (*) 0.30 - 0.70 (IU/mL)   PROTIME-INR     Status: Abnormal   Collection Time   11/20/10  3:43 PM      Component Value Range Comment   Prothrombin Time 24.9 (*) 11.6 - 15.2 (seconds)    INR 2.21 (*) 0.00 - 1.49    GLUCOSE, CAPILLARY     Status: Abnormal   Collection Time   11/20/10  4:57 PM      Component Value Range Comment   Glucose-Capillary 150 (*) 70 - 99 (mg/dL)   HEPARIN LEVEL     Status: Abnormal   Collection Time   11/20/10 11:49 PM      Component Value Range Comment   Heparin Unfractionated 0.71 (*) 0.30 - 0.70 (IU/mL)   GLUCOSE, CAPILLARY     Status: Abnormal   Collection Time   11/21/10 12:33 AM      Component  Value Range Comment   Glucose-Capillary 141 (*) 70 - 99 (mg/dL)   HEPARIN LEVEL     Status: Normal   Collection Time   11/21/10  3:30 AM      Component Value Range Comment   Heparin Unfractionated 0.52  0.30 - 0.70 (IU/mL)   CBC     Status: Abnormal   Collection Time   11/21/10  3:30 AM      Component Value Range Comment   WBC 8.4  4.0 - 10.5 (K/uL)    RBC 4.23  4.22 - 5.81 (MIL/uL)    Hemoglobin 12.5 (*) 13.0 - 17.0 (g/dL)    HCT 16.1 (*) 09.6 - 52.0 (%)    MCV 88.2  78.0 - 100.0 (fL)    MCH 29.6  26.0 - 34.0 (pg)    MCHC 33.5  30.0 - 36.0 (g/dL)    RDW 04.5  40.9 - 81.1 (%)    Platelets 248  150 - 400 (K/uL)   BASIC METABOLIC PANEL     Status: Abnormal   Collection Time   11/21/10  3:30 AM      Component Value Range Comment   Sodium 140  135 - 145 (mEq/L)    Potassium 3.6  3.5 - 5.1 (mEq/L)    Chloride 101  96 - 112 (mEq/L)    CO2 26  19 - 32 (mEq/L)    Glucose, Bld 131 (*) 70 - 99 (mg/dL)    BUN 21  6 - 23 (mg/dL)    Creatinine, Ser 9.14 (*) 0.50 - 1.35 (mg/dL)    Calcium 9.8  8.4 - 10.5 (mg/dL)    GFR calc non Af Amer 45 (*) >90 (mL/min)    GFR calc Af Amer 52 (*) >90 (mL/min)   PRO B NATRIURETIC PEPTIDE     Status: Abnormal   Collection Time    11/21/10  3:30 AM      Component Value Range Comment   BNP, POC 666.0 (*) 0 - 125 (pg/mL)   HEPARIN LEVEL     Status: Normal   Collection Time   11/21/10  9:33 AM      Component Value Range Comment   Heparin Unfractionated 0.39  0.30 - 0.70 (IU/mL)     Ct Angio Head W/cm &/or Wo Cm  11/20/2010  *RADIOLOGY REPORT*  Clinical Data:  55 year old male Code stroke.  Sudden onset of dizziness, headache, left-sided weakness and syncope. Hypertensive episode.  Patient unresponsive.  CT ANGIOGRAPHY HEAD AND NECK  Technique:  Multidetector CT imaging of the head and neck was performed using the standard protocol during bolus administration of intravenous contrast.  Multiplanar CT image reconstructions including MIPs were obtained to evaluate the vascular anatomy. Carotid stenosis measurements (when applicable) are obtained utilizing NASCET criteria, using the distal internal carotid diameter as the denominator.  Contrast: 50mL OMNIPAQUE IOHEXOL 350 MG/ML IV SOLN  Comparison:  Head CTs without contrast 11/19/2010, 05/08/2003.  CTA NECK  Findings:  Bilateral layering pleural effusions.  Endotracheal tube and oral enteric tube in place.  Both appear well positioned, the endotracheal tube terminates proximal to the carina.  18 mm short- axis precarinal lymph node in the superior mediastinum is nonspecific and may be reactive.  No other significant mediastinal lymphadenopathy.  The thyroid is hypoenhancing and heterogeneous.  There is symmetric fluid/stranding surrounding both thyroid lobes, and bounded by the carotid spaces laterally.  Retained secretions in the pharynx.  Negative parapharyngeal spaces, sublingual space, submandibular glands and parotid glands. Trace retropharyngeal  space fluid probably related to the thyroid findings. Mild soft  Mild soft tissue stranding in the right level IIB nodal station is associated with asymmetrically increased, but sub centimeter lymph nodes.  This may be related to the thyroid  changes.  No cervical lymphadenopathy.  Suggestion of exophthalmos.  Otherwise negative orbit soft tissues.  Trace fluid in the mastoid air cells.  Minor ethmoid sinus mucosal thickening. No acute osseous abnormality identified.  Vascular Findings: Three-vessel arch configuration.  Mild arch atherosclerosis.  Normal great vessel origins.  Mild calcified atherosclerosis at the innominate artery bifurcation.  Normal right common carotid artery origin.  Normal right common carotid artery.  Widely patent right carotid bifurcation.  Mild soft and calcified plaque at the right ICA origin and bulb without subsequent stenosis.  Moderate tortuosity of the cervical right ICA.  Calcified atherosclerosis at the right vertebral artery origin resulting in less than 50 % stenosis with respect to the distal vessel.  The cervical right vertebral artery is patent and without additional atherosclerosis.  Normal left common carotid artery origin.  Normal left common carotid.  At the left carotid bifurcation there is circumferential soft and calcified plaque mostly involving the left ICA origin, but without subsequent stenosis.  The cervical left ICA is severely tortuous but otherwise normal.  Normal left subclavian artery origin.  Left vertebral artery origin is within normal limits.  The left vertebral artery is nondominant and remains diminutive throughout the neck.  Remains patent to the skull base.   Review of the MIP images confirms the above findings.  IMPRESSION: 1.  Mild atherosclerosis at the carotid bifurcations.  No carotid stenosis in the neck.  Mild right vertebral artery origin stenosis. 2.  Poorly enhancing thyroid with surrounding stranding / fluid suggestive of acute thyroiditis. Suggestion of exophthalmos. Clinical correlation recommended. 3.  Small to moderate layering pleural effusions.  Nonspecific mediastinal lymphadenopathy may be reactive. 4.  Endotracheal tube and enteric tube appear well placed. 5.  See  intracranial findings below.  CTA HEAD  Findings:  Stable cerebral volume.  No ventriculomegaly. No midline shift, mass effect, or evidence of mass lesion.  Stable periventricular hypodensity, greater on the left. No acute intracranial hemorrhage identified.  Chronic-appearing left cerebellar infarct is new since 2005.  No acute cortically based infarction is identified. No abnormal enhancement identified. Calvarium intact.  Vascular Findings: Major intracranial venous structures are enhancing.  Nondominant distal left vertebral artery is patent, but at the dural crossing is poorly enhancing and remains poorly enhancing to the left vertebrobasilar junction.  There does appear to be enhancement of the left PICA.  The more dominant distal right vertebral artery is patent.  The right vertebrobasilar junction is tortuous.  The right PICA is patent.  There is stenosis at the proximal basilar in the left pre medullary cistern (coronal image 114).  However, the basilar remains patent.  Superior cerebellar artery origins are patent. Fetal type right PCA origin.  Diminutive left posterior communicating artery is patent.  The bilateral PCA vessels are irregular but patent no high-grade PCA stenosis.  Both ICA siphons are patent with mild calcified atherosclerosis not resulting in stenosis with respect to the distal vessel caliber. The bilateral ophthalmic and posterior communicating artery origins are within normal limits.  There is a degree of supraclinoid ICA ectasia bilaterally.  The left carotid terminus is ectatic.  The right ICA terminus is patent with mild atherosclerosis.  The left ACA A1 segment is dominant. There is ectasia of the anterior  communicating artery/08/02 segment confluent, but no discrete aneurysm.  The ACA branches are tortuous and irregular but without focal branch stenosis or occlusion.  Both MCA origins are patent.  Both MCA M1 segments are ectatic without stenosis.  The bilateral MCA branches are  patent without branch stenosis or occlusion.  There is some bilateral M2 segment ectasia.   Review of the MIP images confirms the above findings.  IMPRESSION: 1. High-grade stenosis of the nondominant distal left vertebral artery at the skull base, likely with decreased flow in the left PICA. 2.  Basilar artery stenosis at its origin.  This appears likely to be hemodynamically significant, but the basilar and more distal posterior circulation remain patent. 3.  Chronic-appearing left cerebellar lacunar infarct with no definite CT changes of acute infarct. Still, an acute posterior fossa infarct would be possible. 4.  Anterior circulation dolichoectasia, including in the medium- sized ACA and MCA branches.  No anterior circulation stenosis, occlusion, or focal aneurysm.  Study discussed by telephone with Dr. Craige Cotta on 11/20/2010 at 0900 hours.  Original Report Authenticated By: Harley Hallmark, M.D.   Ct Head Wo Contrast  11/19/2010  *RADIOLOGY REPORT*  Clinical Data: Headache, left-sided weakness  CT HEAD WITHOUT CONTRAST  Technique:  Contiguous axial images were obtained from the base of the skull through the vertex without contrast.  Comparison: Head CT 05/08/2003 and  Findings: Exam is degraded by patient head motion.  There is no intracranial hemorrhage.  No focal mass lesion.  No CT evidence of acute infarction.  No midline shift or mass effect.  No hydrocephalus. Mild microvascular disease is noted in the deep white matter.  Paranasal sinuses and mastoid air cells are clear.  Orbits are normal.  IMPRESSION:    No acute intracranial findings in exam degraded by patient motion.  Original Report Authenticated By: Genevive Bi, M.D.   Ct Angio Neck W/cm &/or Wo/cm  11/20/2010  *RADIOLOGY REPORT*  Clinical Data:  55 year old male Code stroke.  Sudden onset of dizziness, headache, left-sided weakness and syncope. Hypertensive episode.  Patient unresponsive.  CT ANGIOGRAPHY HEAD AND NECK  Technique:   Multidetector CT imaging of the head and neck was performed using the standard protocol during bolus administration of intravenous contrast.  Multiplanar CT image reconstructions including MIPs were obtained to evaluate the vascular anatomy. Carotid stenosis measurements (when applicable) are obtained utilizing NASCET criteria, using the distal internal carotid diameter as the denominator.  Contrast: 50mL OMNIPAQUE IOHEXOL 350 MG/ML IV SOLN  Comparison:  Head CTs without contrast 11/19/2010, 05/08/2003.  CTA NECK  Findings:  Bilateral layering pleural effusions.  Endotracheal tube and oral enteric tube in place.  Both appear well positioned, the endotracheal tube terminates proximal to the carina.  18 mm short- axis precarinal lymph node in the superior mediastinum is nonspecific and may be reactive.  No other significant mediastinal lymphadenopathy.  The thyroid is hypoenhancing and heterogeneous.  There is symmetric fluid/stranding surrounding both thyroid lobes, and bounded by the carotid spaces laterally.  Retained secretions in the pharynx.  Negative parapharyngeal spaces, sublingual space, submandibular glands and parotid glands. Trace retropharyngeal space fluid probably related to the thyroid findings. Mild soft  Mild soft tissue stranding in the right level IIB nodal station is associated with asymmetrically increased, but sub centimeter lymph nodes.  This may be related to the thyroid changes.  No cervical lymphadenopathy.  Suggestion of exophthalmos.  Otherwise negative orbit soft tissues.  Trace fluid in the mastoid air cells.  Minor ethmoid  sinus mucosal thickening. No acute osseous abnormality identified.  Vascular Findings: Three-vessel arch configuration.  Mild arch atherosclerosis.  Normal great vessel origins.  Mild calcified atherosclerosis at the innominate artery bifurcation.  Normal right common carotid artery origin.  Normal right common carotid artery.  Widely patent right carotid  bifurcation.  Mild soft and calcified plaque at the right ICA origin and bulb without subsequent stenosis.  Moderate tortuosity of the cervical right ICA.  Calcified atherosclerosis at the right vertebral artery origin resulting in less than 50 % stenosis with respect to the distal vessel.  The cervical right vertebral artery is patent and without additional atherosclerosis.  Normal left common carotid artery origin.  Normal left common carotid.  At the left carotid bifurcation there is circumferential soft and calcified plaque mostly involving the left ICA origin, but without subsequent stenosis.  The cervical left ICA is severely tortuous but otherwise normal.  Normal left subclavian artery origin.  Left vertebral artery origin is within normal limits.  The left vertebral artery is nondominant and remains diminutive throughout the neck.  Remains patent to the skull base.   Review of the MIP images confirms the above findings.  IMPRESSION: 1.  Mild atherosclerosis at the carotid bifurcations.  No carotid stenosis in the neck.  Mild right vertebral artery origin stenosis. 2.  Poorly enhancing thyroid with surrounding stranding / fluid suggestive of acute thyroiditis. Suggestion of exophthalmos. Clinical correlation recommended. 3.  Small to moderate layering pleural effusions.  Nonspecific mediastinal lymphadenopathy may be reactive. 4.  Endotracheal tube and enteric tube appear well placed. 5.  See intracranial findings below.  CTA HEAD  Findings:  Stable cerebral volume.  No ventriculomegaly. No midline shift, mass effect, or evidence of mass lesion.  Stable periventricular hypodensity, greater on the left. No acute intracranial hemorrhage identified.  Chronic-appearing left cerebellar infarct is new since 2005.  No acute cortically based infarction is identified. No abnormal enhancement identified. Calvarium intact.  Vascular Findings: Major intracranial venous structures are enhancing.  Nondominant distal left  vertebral artery is patent, but at the dural crossing is poorly enhancing and remains poorly enhancing to the left vertebrobasilar junction.  There does appear to be enhancement of the left PICA.  The more dominant distal right vertebral artery is patent.  The right vertebrobasilar junction is tortuous.  The right PICA is patent.  There is stenosis at the proximal basilar in the left pre medullary cistern (coronal image 114).  However, the basilar remains patent.  Superior cerebellar artery origins are patent. Fetal type right PCA origin.  Diminutive left posterior communicating artery is patent.  The bilateral PCA vessels are irregular but patent no high-grade PCA stenosis.  Both ICA siphons are patent with mild calcified atherosclerosis not resulting in stenosis with respect to the distal vessel caliber. The bilateral ophthalmic and posterior communicating artery origins are within normal limits.  There is a degree of supraclinoid ICA ectasia bilaterally.  The left carotid terminus is ectatic.  The right ICA terminus is patent with mild atherosclerosis.  The left ACA A1 segment is dominant. There is ectasia of the anterior communicating artery/08/02 segment confluent, but no discrete aneurysm.  The ACA branches are tortuous and irregular but without focal branch stenosis or occlusion.  Both MCA origins are patent.  Both MCA M1 segments are ectatic without stenosis.  The bilateral MCA branches are patent without branch stenosis or occlusion.  There is some bilateral M2 segment ectasia.   Review of the MIP images confirms  the above findings.  IMPRESSION: 1. High-grade stenosis of the nondominant distal left vertebral artery at the skull base, likely with decreased flow in the left PICA. 2.  Basilar artery stenosis at its origin.  This appears likely to be hemodynamically significant, but the basilar and more distal posterior circulation remain patent. 3.  Chronic-appearing left cerebellar lacunar infarct with no  definite CT changes of acute infarct. Still, an acute posterior fossa infarct would be possible. 4.  Anterior circulation dolichoectasia, including in the medium- sized ACA and MCA branches.  No anterior circulation stenosis, occlusion, or focal aneurysm.  Study discussed by telephone with Dr. Craige Cotta on 11/20/2010 at 0900 hours.  Original Report Authenticated By: Harley Hallmark, M.D.   Ct Angio Chest W/cm &/or Wo Cm  11/19/2010  *RADIOLOGY REPORT*  Clinical Data:  Sudden onset of dizziness and headache.  Dyspnea. Unresponsive.  On a ventilator.  Clinical concern for aortic dissection or pulmonary embolism.  CT ANGIOGRAPHY CHEST WITH CONTRAST  Technique:  Multidetector CT imaging of the chest was performed using the standard protocol during bolus administration of intravenous contrast.  Multiplanar CT image reconstructions including MIPs were obtained to evaluate the vascular anatomy.  Contrast: 50mL OMNIPAQUE IOHEXOL 350 MG/ML IV SOLN  Comparison:  Portable chest obtained earlier today.  Findings:  Endotracheal tube in satisfactory position.  Orogastric or nasogastric tube extending into the stomach.  Enlarged heart. Normally opacified pulmonary arteries.  Breathing motion artifacts at the lung bases with no definite pulmonary arterial filling defects seen.  The examination was timed for evaluation of the pulmonary arteries.  Therefore, no significant contrast is present in the aorta.  No displaced intimal calcifications.  Mild to moderate ground-glass interstitial opacity in both lungs, most pronounced in the dependent portions of both lungs.  There is also some patchy airspace opacity in the dependent portions of both lungs as well as small to moderate-sized bilateral pleural effusions.  No lung masses or enlarged lymph nodes are seen. Unremarkable upper abdomen.  Thoracic spine degenerative changes.  Review of the MIP images confirms the above findings.  IMPRESSION:  1.  No pulmonary emboli. 2.  Unable to  evaluate for aortic dissection since the examination was tailored to evaluate for pulmonary emboli. 3.  Cardiomegaly and changes of congestive heart failure with interstitial pulmonary edema and bilateral pleural effusions. 4.  Bilateral dependent atelectasis.  Original Report Authenticated By: Darrol Angel, M.D.   Mr Brain Wo Contrast  11/21/2010  *RADIOLOGY REPORT*  Clinical Data: Stroke.  Left-sided weakness and dizziness.  MRI HEAD WITHOUT CONTRAST  Technique:  Multiplanar, multiecho pulse sequences of the brain and surrounding structures were obtained according to standard protocol without intravenous contrast.  Comparison: CTA head 11/19/2010.  CT head without contrast 11/19/2010.  Findings: The diffusion weighted images demonstrate an acute / subacute non hemorrhagic infarct involving the right superior cerebellar artery territory.  There is focal infarction along the posterior aspect of the right pons and inferior mid brain, near the inferior colliculus.  The left cerebellar infarct is remote.  No other areas of acute infarction are seen.  Multiple punctate areas of remote hemorrhage are evident within the basal ganglia bilaterally.  There are punctate areas of hemorrhage within the brainstem and cerebellum as well.  These appear remote and raise suspicion for amyloid angiopathy.  Abnormal flow signal is present within the left vertebral artery at the dural margin, as seen on the CT angiogram.  This is compatible with slow flow and high-grade  stenosis or partial occlusion.  Flow is present in the anterior circulation.  The globes orbits are intact.  The paranasal sinuses and mastoid air cells are clear.  IMPRESSION:  1.  Acute / sub acute non hemorrhagic infarct involving the right superior cerebellar artery territory, including the posterolateral inferior mid brain and upper pons. 2.  Remote lacunar infarct of the left cerebellum. 3.  Multiple punctate areas of remote hemorrhage, compatible with  amyloid angiopathy.  Original Report Authenticated By: Jamesetta Orleans. MATTERN, M.D.   Dg Chest Portable 1 View  11/21/2010  *RADIOLOGY REPORT*  Clinical Data: Follow up pulmonary edema  PORTABLE CHEST - 1 VIEW  Comparison: 11/20/2010  Findings: The ET tube tip is above the carina.  There is a nasogastric tube with side port below GE junction.  The heart size appears enlarged.  There has been interval improvement in bilateral pulmonary edema and effusions.  No new findings are identified.  IMPRESSION:  1.  Interval improvement and pulmonary edema pattern.  Original Report Authenticated By: Rosealee Albee, M.D.   Dg Chest Portable 1 View  11/20/2010  *RADIOLOGY REPORT*  Clinical Data: Pulmonary edema.  PORTABLE CHEST - 1 VIEW  Comparison: 11/19/2010,  Findings: A nasogastric tube has been placed and the tip is located below the level of the hemidiaphragms but not visible on this exam. The endotracheal tube is stable in position.  Cardiomegaly is again identified and stable in degree. The mediastinal contour is unchanged.  There has been an interval increase in bibasilar density since the previous exam. Prominence of the minor fissure suggests a component of this density on the right may be related to posteriorly layering pleural fluid. Basilar density may be the result of basilar alveolar edema although pneumonia is not excluded with this appearance.  IMPRESSION: Increasing bibasilar density with suspicion of some right pleural fluid and fissural fluid.  Question basilar alveolar edema versus pneumonia.  Original Report Authenticated By: Bertha Stakes, M.D.   Dg Chest Portable 1 View  11/19/2010  *RADIOLOGY REPORT*  Clinical Data: Endotracheal tube placement.  Shortness of breath.  PORTABLE CHEST - 1 VIEW  Comparison: Chest x-ray 02/02/2010.  Findings: The endotracheal tube is 4.3 cm above the carina.  The heart is enlarged but stable.  Prominent but stable mediastinal and hilar contours.  Somewhat  asymmetric airspace process could reflect edema or infiltrates.  No effusions.  IMPRESSION: The endotracheal tube is 4.3 cm above the carina. Probable pulmonary edema versus bronchopneumonia.  Original Report Authenticated By: P. Loralie Champagne, M.D.    Review of Systems  Constitutional: Negative for fever.  Respiratory: Negative for cough.   Cardiovascular: Negative for chest pain, palpitations and leg swelling.  Neurological: Positive for dizziness. Negative for headaches.   Blood pressure 171/123, pulse 92, temperature 98.8 F (37.1 C), temperature source Oral, resp. rate 15, height 5\' 9"  (1.753 m), weight 99.2 kg (218 lb 11.1 oz), SpO2 99.00%. Physical Exam  Constitutional: He appears well-developed.  HENT:  Head: Atraumatic.  Eyes: Pupils are equal, round, and reactive to light.  Neck: Neck supple. No JVD present.  Cardiovascular: Normal rate.    irregularly irregular A. fib with controlled ventricular response on the monitor S1 and S2 is soft There is soft systolic murmur at left lower sternal border and soft diastolic murmur at left lower sternal border. And soft S3 gallop. Abdomen is soft bowel sounds were present nontender. Extremities there is no clubbing cyanosis or edema  Pedal pulses were 2+  Assessment/Plan: Impression Resolving pulmonary edema Status post hypertensive urgency/ Status post acute respiratory failure secondary to above Status post cardiogenic acute cerebellar infarct Uncontrolled hypertension Diabetes mellitus Hypercholesteremia History of alcohol abuse Mild renal insufficiency Gouty arthritis Plan Agree with present management with beta blockers ACE inhibitors statins and diuretics. Increase ACE inhibitor as per ordered Agree with IV heparin and Coumadin Dr. Algie Coffer is on call for me for weekend. Khamille Beynon N 11/21/2010, 12:56 PM

## 2010-11-21 NOTE — Progress Notes (Signed)
UR Completed.  Rose Hegner [name] 11/21/2010  

## 2010-11-21 NOTE — Progress Notes (Signed)
PT Cancellation Note  Treatment cancelled today due to medical issues with patient which prohibited therapy - patient remains intubated. Per RN patient will have possible extubation later today. Will likely defer evaluation to 11/22/2010

## 2010-11-21 NOTE — Progress Notes (Signed)
eLink Physician-Brief Progress Note Patient Name: [name] DOB: [birth year] MRN: [id number]  Date of Service  11/21/2010   HPI/Events of Note   Hypertension  eICU Interventions  Ongoing elevated BP on cardene gtt and had been on oral agents.  BP currently 175/111 with MAP of 128.  Order for labetalol 10 mg IV q2 hours prn systolic BP greater than 180 mHg   Intervention Category Intermediate Interventions: Communication with other healthcare providers and/or family  DETERDING,ELIZABETH 11/21/2010, 10:49 PM

## 2010-11-21 NOTE — Progress Notes (Signed)
STROKE TEAM PROGRESS NOTE  SUBJECTIVE 55 year old african [place] male with new left arm weakness and was brought in as a stroke code. He is not a t-PA candidate as his INR was more than 1.7. He had respiratory distress and had to be intubated. CTA H and N performed in ED did not show a clot that could be taken out. In all likelihood, he embolized with subtherapeutic INR and then the clot broke apart. He remains intubated but is able to follow commands.  He is yet intubated because of pulmonary. No new untoward events noted overnight. Vitals remain stable.  MRI scan of the brain shows a large right superior cerebellar artery infarct with mass effect and some distortion of the brainstem but no hydrocephalus. No hemorrhage is noted. OBJECTIVE Vital signs: Temp: 98.7 F (37.1 C) (11/09 0000) Temp src: Axillary (11/09 0000) BP: 176/142 mmHg (11/09 0700) Pulse Rate: 85  (11/09 0700) Respiratory Rate: 18  Intake/Output from previous day: 11/08 0701 - 11/09 0700 In: 839.8 [I.V.:279.8; NG/GT:210; IV Piggyback:350] Out: 4635 [Urine:4635]   IV Fluid Intake:     . sodium chloride 15 mL/hr at 11/21/10 0700  . dextrose    . heparin 1,700 Units/hr (11/21/10 0840)    Diet: NPO  Activity: Bedrest  DVT Prophylaxis:  SCDs, IV heparin  Studies: CBC    Component Value Date/Time   WBC 8.4 11/21/2010 0330   RBC 4.23 11/21/2010 0330   HGB 12.5* 11/21/2010 0330   HCT 37.3* 11/21/2010 0330   PLT 248 11/21/2010 0330   MCV 88.2 11/21/2010 0330   MCH 29.6 11/21/2010 0330   MCHC 33.5 11/21/2010 0330   RDW 15.0 11/21/2010 0330   LYMPHSABS 5.0* 11/19/2010 1715   MONOABS 0.7 11/19/2010 1715   EOSABS 0.3 11/19/2010 1715   BASOSABS 0.0 11/19/2010 1715   CMP     Component Value Date/Time   NA 140 11/21/2010 0330   K 3.6 11/21/2010 0330   CL 101 11/21/2010 0330   CO2 26 11/21/2010 0330   GLUCOSE 131* 11/21/2010 0330   BUN 21 11/21/2010 0330   CREATININE 1.66* 11/21/2010 0330   CALCIUM 9.8 11/21/2010 0330   PROT  7.9 11/19/2010 1715   ALBUMIN 3.4* 11/19/2010 1715   AST 32 11/19/2010 1715   ALT 25 11/19/2010 1715   ALKPHOS 73 11/19/2010 1715   BILITOT 0.4 11/19/2010 1715   GFRNONAA 45* 11/21/2010 0330   GFRAA 52* 11/21/2010 0330    Lipid Panel     Component Value Date/Time   CHOL 203* 11/20/2010 1109   TRIG 158* 11/20/2010 1109   HDL 51 11/20/2010 1109   CHOLHDL 4.0 11/20/2010 1109   VLDL 32 11/20/2010 1109   LDLCALC 120* 11/20/2010 1109   hgba1C    Lab Results  Component Value Date   HGBA1C 8.2* 11/20/2010    MRI of the brain: large acute / sub acute non hemorrhagic infarct involving the right superior cerebellar artery territory, including the posterolateral inferior mid brain and upper pons with mass effect on the brain stem. Remote lacunar infarct of the left cerebellum.  Multiple punctate areas of remote hemorrhage, compatible with  Hypertensive micohemorrhages. 2D Echocardiogram: EF 25-30%, afib, no obvious source of embolus MRI brain shows large right superior cerebellar artery infarct without hemorrhage but there is mass effect on the brainstem but with distortion but no hydrocephalus Physical Exam:  Respiratory: intubated. Neurological exam: was able to follow some simple commands intermittently. Cranial nerves: EOMI, PERRL. Visual fields were full.  There was no facial asymmetry. Hearing to finger rub was equal and symmetrical bilaterally.Motor: strength was 5/5 and symmetric throughout except left arm, which was 2/5. Sensory: was intact throughout to pinprick. Coordination: could not assess. Reflexes: were 1+ in upper extremities and 1+ at the knees and 1+ at the ankles. Plantar response was downgoing bilaterally. Gait: deferred   ASSESSMENT [name] is an 55 y.o. year old male with left-sided weakness and dizziness likely a right hemispheric TIA versus a small right brain nfarct of cardioembolic etiology secondary to atrial fibrillation with suboptimal anticoagulation on Coumadin with INR of  1.7 on admission. CT angiogram shows atherosclerotic posterior circulation disease    . Pulmonary edema and respiratory failure requiring intubation .  Patient Active Problem List  Diagnoses  . Stroke  . Hypertensive emergency  . Pulmonary edema  . Acute exacerbation of congestive heart failure  . Hypoxemia  . Diabetes mellitus  . Respiratory failure  . A-fib  . Gout  . Encephalopathy  . CAD (coronary artery disease)  . Pneumonia, organism unspecified  . Thyroiditis   Hospital day # 2  TREATMENT/PLAN  .Continue IV heparin general he can either swallow or has a PEG tube for Coumadin to be started. extub if ok w/ ccm Cough/gag weak. And I suspect he may not be able to protect his airway but if that happens he needs intubation and tracheostomy. This will have to be discussed at length with the patient and family prior to planned exit extubation  SHARON BIBY, AVNP, ANP-BC, GNP-BC [facility] Pager: [phone] 11/21/2010 8:45 AM

## 2010-11-21 NOTE — Progress Notes (Signed)
ANTICOAGULATION CONSULT NOTE - Follow Up Consult  Pharmacy Consult for Heparin Indication: atrial fibrillation and stroke  No Known Allergies  Patient Measurements: Height: 5\' 9"  (175.3 cm) Weight: 218 lb 11.1 oz (99.2 kg) IBW/kg (Calculated) : 70.7  Adjusted Body Weight:   Vital Signs: Temp: 98.7 F (37.1 C) (11/09 0000) Temp src: Axillary (11/09 0000) BP: 169/119 mmHg (11/09 0600) Pulse Rate: 91  (11/09 0600)  Labs:  Basename 11/21/10 0330 11/20/10 2349 11/20/10 1543 11/20/10 1542 11/20/10 1109 11/20/10 0400 11/20/10 0004 11/19/10 1715 11/19/10 1709  HGB 12.5* -- -- -- -- 11.8* -- -- --  HCT 37.3* -- -- -- -- 35.5* -- 40.5 --  PLT 248 -- -- -- -- 235 -- 254 --  APTT -- -- -- -- -- -- -- -- 26  LABPROT -- -- 24.9* -- -- -- -- -- 21.0*  INR -- -- 2.21* -- -- -- -- -- 1.78*  HEPARINUNFRC 0.52 0.71* <0.10* -- -- -- -- -- --  CREATININE 1.66* -- -- -- -- 1.39* -- 1.42* --  CKTOTAL -- -- -- 95 107 -- 100 -- --  CKMB -- -- -- 2.2 2.9 -- 2.7 -- --  TROPONINI -- -- -- <0.30 <0.30 -- <0.30 -- --   Estimated Creatinine Clearance: 57.7 ml/min (by C-G formula based on Cr of 1.66).   Medications:  Scheduled:     . amLODipine  5 mg Oral Daily  . antiseptic oral rinse  15 mL Mouth Rinse QID  . azithromycin  500 mg Intravenous Q24H  . carvedilol  25 mg Oral BID WC  . cefTRIAXone (ROCEPHIN) IV  1 g Intravenous Q24H  . chlorhexidine  15 mL Mouth Rinse BID  . chlorhexidine      . cloNIDine  0.1 mg Oral BID  . famotidine (PEPCID) IV  20 mg Intravenous Q12H  . fentaNYL  100 mcg Intravenous Once  . furosemide  40 mg Intravenous Q12H  . influenza (>/= 3 years) inactive virus vaccine  0.5 mL Intramuscular Tomorrow-1000  . insulin aspart  0-4 Units Subcutaneous Q4H  . isosorbide-hydrALAZINE  1 tablet Oral TID  . pneumococcal 23 valent vaccine  0.5 mL Intramuscular Tomorrow-1000  . ramipril  10 mg Oral Daily  . spironolactone  50 mg Oral Daily  . DISCONTD: cefTRIAXone (ROCEPHIN) IM   1 g Intramuscular Q24H  . DISCONTD: furosemide  40 mg Intravenous Q8H   Infusions:     . sodium chloride 15 mL/hr at 11/21/10 0600  . dextrose    . heparin 17 mL/hr (11/21/10 0600)  . DISCONTD: heparin 12 Units/kg/hr (11/20/10 0600)  . DISCONTD: propofol Stopped (11/20/10 0746)    Assessment: Pt is on heparin for afib/CVA. His INR has not resulted today . His heparin level is 0.52. We will continue heparin at current rate Goal of Therapy:  0.3-0.5   Plan:  Continue  heparin drip to 1700 units/hr Check 6hr heparin level    Janice Coffin 11/21/2010,6:21 AM

## 2010-11-22 ENCOUNTER — Inpatient Hospital Stay (HOSPITAL_COMMUNITY): Payer: 59

## 2010-11-22 DIAGNOSIS — I629 Nontraumatic intracranial hemorrhage, unspecified: Secondary | ICD-10-CM

## 2010-11-22 DIAGNOSIS — J9600 Acute respiratory failure, unspecified whether with hypoxia or hypercapnia: Secondary | ICD-10-CM

## 2010-11-22 DIAGNOSIS — R4020 Unspecified coma: Secondary | ICD-10-CM

## 2010-11-22 LAB — BASIC METABOLIC PANEL
BUN: 16 mg/dL (ref 6–23)
Calcium: 10.3 mg/dL (ref 8.4–10.5)
GFR calc non Af Amer: 66 mL/min — ABNORMAL LOW (ref >90)
Glucose, Bld: 152 mg/dL — ABNORMAL HIGH (ref 70–99)

## 2010-11-22 LAB — BLOOD GAS, ARTERIAL
Bicarbonate: 26.4 mEq/L — ABNORMAL HIGH (ref 20.0–24.0)
PEEP: 5 cmH2O
Patient temperature: 98.6
pCO2 arterial: 41.5 mmHg (ref 35.0–45.0)
pH, Arterial: 7.421 (ref 7.350–7.450)
pO2, Arterial: 288 mmHg — ABNORMAL HIGH (ref 80.0–100.0)

## 2010-11-22 LAB — GLUCOSE, CAPILLARY
Glucose-Capillary: 120 mg/dL — ABNORMAL HIGH (ref 70–99)
Glucose-Capillary: 166 mg/dL — ABNORMAL HIGH (ref 70–99)
Glucose-Capillary: 185 mg/dL — ABNORMAL HIGH (ref 70–99)
Glucose-Capillary: 200 mg/dL — ABNORMAL HIGH (ref 70–99)

## 2010-11-22 LAB — CBC
MCH: 30.5 pg (ref 26.0–34.0)
MCHC: 34.8 g/dL (ref 30.0–36.0)
Platelets: 270 10*3/uL (ref 150–400)

## 2010-11-22 MED ORDER — SODIUM CHLORIDE 3 % IV SOLN
INTRAVENOUS | Status: DC
  Administered 2010-11-22: 75 mL/h via INTRAVENOUS
  Filled 2010-11-22 (×17): qty 500

## 2010-11-22 MED ORDER — FENTANYL CITRATE 0.05 MG/ML IJ SOLN
INTRAMUSCULAR | Status: AC
  Filled 2010-11-22: qty 2

## 2010-11-22 MED ORDER — RAMIPRIL 10 MG PO CAPS: 10.0000 mg | ORAL_CAPSULE | Freq: Every day | ORAL | Status: DC

## 2010-11-22 MED ORDER — VITAMIN K1 10 MG/ML IJ SOLN
10.0000 mg | Freq: Once | INTRAMUSCULAR | Status: AC
  Administered 2010-11-22: 10 mg via INTRAMUSCULAR
  Filled 2010-11-22: qty 1

## 2010-11-22 MED ORDER — RAMIPRIL 10 MG PO CAPS
10.0000 mg | ORAL_CAPSULE | Freq: Two times a day (BID) | ORAL | Status: DC
  Administered 2010-11-23 – 2010-11-30 (×16): 10 mg via ORAL
  Filled 2010-11-22 (×19): qty 1

## 2010-11-22 MED ORDER — SUCCINYLCHOLINE CHLORIDE 20 MG/ML IJ SOLN
20.0000 mg | INTRAMUSCULAR | Status: AC
  Administered 2010-11-22: 20 mg via INTRAVENOUS

## 2010-11-22 MED ORDER — FLEXIFLO TOPTAINER FEED SET MISC
Status: AC
  Administered 2010-11-22: 16:00:00
  Filled 2010-11-22: qty 1

## 2010-11-22 MED ORDER — ETOMIDATE 2 MG/ML IV SOLN
20.0000 mg | Freq: Once | INTRAVENOUS | Status: AC
  Administered 2010-11-22: 20 mg via INTRAVENOUS

## 2010-11-22 MED FILL — Ramipril Cap 10 MG: ORAL | Qty: 1 | Status: AC

## 2010-11-22 NOTE — Procedures (Signed)
Central Venous Catheter Insertion Procedure Note [name] [id number] 10-16-1955  Procedure: Insertion of Central Venous Catheter Indications: IV medication  Procedure Details Consent: Taken Time Out: Verified patient identification, verified procedure, site/side was marked, verified correct patient position, special equipment/implants available, medications/allergies/relevent history reviewed, required imaging and test results available.   Maximum sterile technique was used. Skin prep: Chlorhexidine; local anesthetic administered A antimicrobial bonded/coated triple lumen catheter was placed in the right subclavian using the Seldinger technique.  Evaluation Blood flow: Good  Complications: None Patient did tolerate procedure well. Chest X-ray ordered to verify placement.  CXR: Pending.  [name] 11/22/2010, 8:42 PM

## 2010-11-22 NOTE — Consult Note (Signed)
Reason for Consult: Change in consciousness Referring Physician: Dr. Nathen May is an 55 y.o. male.  HPI: Patient was admitted for cerebellar stroke. He became unresponsive yesterday evening. CT scan was done showing early hydrocephalus and significant edema the right cerebellum and brainstem compression. Patient was intubated. Dr. Terrace Arabia to call there we've discussed the case. He was anticoagulated and no surgical intervention could be done while patient is anticoagulated this needed to be reversed.  PMH:  Past Medical History  Diagnosis Date  . Diabetes mellitus   . Coronary artery disease   . Hypertension   . Gout   . CHF (congestive heart failure)   . Afib   . Arthritis     History reviewed. No pertinent past surgical history.  Family History: History reviewed. No pertinent family history.  Social History:  reports that he quit smoking about 4 years ago. His smoking use included Cigarettes. He does not have any smokeless tobacco history on file. He reports that he drinks about 6.6 ounces of alcohol per week. He reports that he does not use illicit drugs.  Allergies: No Known Allergies  Medications:  Prior to Admission:  Prescriptions prior to admission  Medication Sig Dispense Refill  . allopurinol (ZYLOPRIM) 300 MG tablet Take 300 mg by mouth daily.        Marland Kitchen amLODipine (NORVASC) 5 MG tablet Take 5 mg by mouth daily.        . AMOXICILLIN PO Take 400 mg by mouth 2 (two) times daily.        Marland Kitchen aspirin 81 MG tablet Take 81 mg by mouth daily.        . carvedilol (COREG) 25 MG tablet Take 25 mg by mouth 2 (two) times daily with a meal.        . celecoxib (CELEBREX) 200 MG capsule Take 200 mg by mouth 2 (two) times daily.        . cloNIDine (CATAPRES) 0.1 MG tablet Take 0.1 mg by mouth 2 (two) times daily.        . colchicine 0.6 MG tablet Take 0.6 mg by mouth daily.        . cyclobenzaprine (FLEXERIL) 10 MG tablet Take 10 mg by mouth 2 (two) times a week.        .  furosemide (LASIX) 20 MG tablet Take 20 mg by mouth daily.        . isosorbide-hydrALAZINE (BIDIL) 20-37.5 MG per tablet Take 1 tablet by mouth 3 (three) times daily.        . metFORMIN (GLUCOPHAGE) 500 MG tablet Take 500 mg by mouth 2 (two) times daily with a meal.        . Multiple Vitamin (MULTIVITAMIN) capsule Take 1 capsule by mouth daily.        . naproxen sodium (ANAPROX) 220 MG tablet Take 220 mg by mouth as needed.        . ramipril (ALTACE) 10 MG tablet Take 10 mg by mouth daily.        Marland Kitchen spironolactone (ALDACTONE) 50 MG tablet Take 50 mg by mouth daily.        Marland Kitchen warfarin (COUMADIN) 7.5 MG tablet Take 7.5 mg by mouth daily.         HEPARIN LEVEL     Status: Abnormal   Collection Time   11/20/10  3:43 PM      Component Value Range Comment   Heparin Unfractionated <0.10 (*) 0.30 - 0.70 (IU/mL)  PROTIME-INR     Status: Abnormal   Collection Time   11/20/10  3:43 PM      Component Value Range Comment   Prothrombin Time 24.9 (*) 11.6 - 15.2 (seconds)    INR 2.21 (*) 0.00 - 1.49    GLUCOSE, CAPILLARY     Status: Abnormal   Collection Time   11/20/10  4:57 PM      Component Value Range Comment   Glucose-Capillary 150 (*) 70 - 99 (mg/dL)   HEPARIN LEVEL     Status: Abnormal   Collection Time   11/20/10 11:49 PM      Component Value Range Comment   Heparin Unfractionated 0.71 (*) 0.30 - 0.70 (IU/mL)   GLUCOSE, CAPILLARY     Status: Abnormal   Collection Time   11/21/10 12:33 AM      Component Value Range Comment   Glucose-Capillary 141 (*) 70 - 99 (mg/dL)   HEPARIN LEVEL     Status: Normal   Collection Time   11/21/10  3:30 AM      Component Value Range Comment   Heparin Unfractionated 0.52  0.30 - 0.70 (IU/mL)   CBC     Status: Abnormal   Collection Time   11/21/10  3:30 AM      Component Value Range Comment   WBC 8.4  4.0 - 10.5 (K/uL)    RBC 4.23  4.22 - 5.81 (MIL/uL)    Hemoglobin 12.5 (*) 13.0 - 17.0 (g/dL)    HCT 40.9 (*) 81.1 - 52.0 (%)    MCV 88.2  78.0 - 100.0 (fL)     MCH 29.6  26.0 - 34.0 (pg)    MCHC 33.5  30.0 - 36.0 (g/dL)    RDW 91.4  78.2 - 95.6 (%)    Platelets 248  150 - 400 (K/uL)   BASIC METABOLIC PANEL     Status: Abnormal   Collection Time   11/21/10  3:30 AM      Component Value Range Comment   Sodium 140  135 - 145 (mEq/L)    Potassium 3.6  3.5 - 5.1 (mEq/L)    Chloride 101  96 - 112 (mEq/L)    CO2 26  19 - 32 (mEq/L)    Glucose, Bld 131 (*) 70 - 99 (mg/dL)    BUN 21  6 - 23 (mg/dL)    Creatinine, Ser 2.13 (*) 0.50 - 1.35 (mg/dL)    Calcium 9.8  8.4 - 10.5 (mg/dL)    GFR calc non Af Amer 45 (*) >90 (mL/min)    GFR calc Af Amer 52 (*) >90 (mL/min)    Ct Head Wo Contrast  11/21/2010  *RADIOLOGY REPORT*  Clinical Data:   Altered mental status  CT HEAD WITHOUT CONTRAST  Technique:  Contiguous axial images were obtained from the base of the skull through the vertex without contrast.  Comparison: Brain MRI 11/20/2010 and 07/2010  Findings: The  large infarction within the left cerebellum is now evident  on CT. The infarction involves the entirety of the right cerebral hemisphere and extends to the vermis.  There is increased effacement of the fourth ventricle compared to MRI one day prior. There is increase in the ventricular volume with the temporal horns more conspicuous than on prior.  There is no evidence of intracranial hemorrhage.  No midline shift.  IMPRESSION:  1. Large right cerebellar infarction. 2.  Edema from the infarcted cerebellum compresses the fourth ventricle with mild obstructive hydrocephalus increased from  1 day prior.  Findings conveyed to Dr. Maple Hudson tube on 11/21/2010 at 2023 hours  Original Report Authenticated By: Genevive Bi, M.D.   Mr Brain Wo Contrast  11/21/2010  *RADIOLOGY REPORT*  Clinical Data: Stroke.  Left-sided weakness and dizziness.  MRI HEAD WITHOUT CONTRAST  Technique:  Multiplanar, multiecho pulse sequences of the brain and surrounding structures were obtained according to standard protocol without  intravenous contrast.  Comparison: CTA head 11/19/2010.  CT head without contrast 11/19/2010.  Findings: The diffusion weighted images demonstrate an acute / subacute non hemorrhagic infarct involving the right superior cerebellar artery territory.  There is focal infarction along the posterior aspect of the right pons and inferior mid brain, near the inferior colliculus.  The left cerebellar infarct is remote.  No other areas of acute infarction are seen.  Multiple punctate areas of remote hemorrhage are evident within the basal ganglia bilaterally.  There are punctate areas of hemorrhage within the brainstem and cerebellum as well.  These appear remote and raise suspicion for amyloid angiopathy.  Abnormal flow signal is present within the left vertebral artery at the dural margin, as seen on the CT angiogram.  This is compatible with slow flow and high-grade stenosis or partial occlusion.  Flow is present in the anterior circulation.  The globes orbits are intact.  The paranasal sinuses and mastoid air cells are clear.  IMPRESSION:  1.  Acute / sub acute non hemorrhagic infarct involving the right superior cerebellar artery territory, including the posterolateral inferior mid brain and upper pons. 2.  Remote lacunar infarct of the left cerebellum. 3.  Multiple punctate areas of remote hemorrhage, compatible with amyloid angiopathy.  Original Report Authenticated By: Jamesetta Orleans. MATTERN, M.D.   Dg Chest Portable 1 View  11/22/2010  *RADIOLOGY REPORT*  Clinical Data: Endotracheal tube placement.  PORTABLE CHEST - 1 VIEW  Comparison: Chest radiograph performed 11/21/2010  Findings: The patient's endotracheal tube is seen ending 2-3 cm above the carina.  The lungs are well-aerated.  Obscuration of the right hemidiaphragm may reflect residual edema, though pneumonia cannot be entirely excluded.  Mild vascular congestion is noted.  There is no evidence of pleural effusion or pneumothorax.  The cardiomediastinal  silhouette is enlarged.  No acute osseous abnormalities are seen.  IMPRESSION:  1.  Endotracheal tube seen ending 2-3 cm above the carina. 2.  Obscuration of the right hemidiaphragm may reflect residual edema, though pneumonia cannot be entirely excluded. 3.  Mild vascular congestion and cardiomegaly noted.  Original Report Authenticated By: Tonia Ghent, M.D.   Dg Chest Portable 1 View  11/21/2010  *RADIOLOGY REPORT*  Clinical Data: Follow up pulmonary edema  PORTABLE CHEST - 1 VIEW  Comparison: 11/20/2010  Findings: The ET tube tip is above the carina.  There is a nasogastric tube with side port below GE junction.  The heart size appears enlarged.  There has been interval improvement in bilateral pulmonary edema and effusions.  No new findings are identified.  IMPRESSION:  1.  Interval improvement and pulmonary edema pattern.  Original Report Authenticated By: Rosealee Albee, M.D.   Dg Chest Portable 1 View  11/20/2010  *RADIOLOGY REPORT*  Clinical Data: Pulmonary edema.  PORTABLE CHEST - 1 VIEW  Comparison: 11/19/2010,  Findings: A nasogastric tube has been placed and the tip is located below the level of the hemidiaphragms but not visible on this exam. The endotracheal tube is stable in position.  Cardiomegaly is again identified and stable in degree. The mediastinal contour  is unchanged.  There has been an interval increase in bibasilar density since the previous exam. Prominence of the minor fissure suggests a component of this density on the right may be related to posteriorly layering pleural fluid. Basilar density may be the result of basilar alveolar edema although pneumonia is not excluded with this appearance.  IMPRESSION: Increasing bibasilar density with suspicion of some right pleural fluid and fissural fluid.  Question basilar alveolar edema versus pneumonia.  Original Report Authenticated By: Bertha Stakes, M.D.    @ROS @ Blood pressure 173/98, pulse 110, temperature 99.5 F (37.5  C), temperature source Oral, resp. rate 20, height 5\' 9"  (1.753 m), weight 99.2 kg (218 lb 11.1 oz), SpO2 100.00%. @PHYSEXAMBYAGE2 @  Assessment/Plan: Patient with a very difficult problem.  No neurosurgical intervention can be considered until coagulpathy reversed.  Will follow. Clydene Fake, MD 11/22/2010, 4:16 AM

## 2010-11-22 NOTE — Clinical Documentation Improvement (Signed)
Central Line Insertion ordered at 09:30 by Dr. Pearlean Brownie, order transmitted to [facility] via EPIC and phone.  At this time awaiting central line placement to begin 3% Saline.  Dr. Pearlean Brownie aware.  Stephanie Coup, RN 5:54 PM 11/22/2010

## 2010-11-22 NOTE — Progress Notes (Signed)
Subjective:  55 yo Male with right cerebellum bleeding, hx of Afib, on coumadin and heparin is 2.2 today, was found to have sudden neurological status, unresponsive to pain, repeat CT head showed large right cerebellum infarction, with obstruction of 4th ventricle, hydrocephalus.  I have called neurosurgeon on call Dr. Phoebe Perch, he reviewed the film, would not do intervention tonight due to elevated INR, he has suggest reverse coagulopathy with FFP and Vit K. Intubate, repeat CT head in am.   Patient Active Problem List  Diagnoses Date Noted  . Hyperlipemia 11/21/2010  . Gout 11/20/2010  . Encephalopathy 11/20/2010  . CAD (coronary artery disease) 11/20/2010  . Pneumonia, organism unspecified 11/20/2010  . Thyroiditis 11/20/2010  . Stroke 11/19/2010  . Hypertensive emergency 11/19/2010  . Pulmonary edema 11/19/2010  . Acute exacerbation of congestive heart failure 11/19/2010  . Hypoxemia 11/19/2010  . Diabetes mellitus 11/19/2010  . Respiratory failure 11/19/2010  . A-fib 11/19/2010   Past Medical History  Diagnosis Date  . Diabetes mellitus   . Coronary artery disease   . Hypertension   . Gout   . CHF (congestive heart failure)   . Afib   . Arthritis     History reviewed. No pertinent past surgical history.  Prescriptions prior to admission  Medication Sig Dispense Refill  . allopurinol (ZYLOPRIM) 300 MG tablet Take 300 mg by mouth daily.        Marland Kitchen amLODipine (NORVASC) 5 MG tablet Take 5 mg by mouth daily.        . AMOXICILLIN PO Take 400 mg by mouth 2 (two) times daily.        Marland Kitchen aspirin 81 MG tablet Take 81 mg by mouth daily.        . carvedilol (COREG) 25 MG tablet Take 25 mg by mouth 2 (two) times daily with a meal.        . celecoxib (CELEBREX) 200 MG capsule Take 200 mg by mouth 2 (two) times daily.        . cloNIDine (CATAPRES) 0.1 MG tablet Take 0.1 mg by mouth 2 (two) times daily.        . colchicine 0.6 MG tablet Take 0.6 mg by mouth daily.        . cyclobenzaprine  (FLEXERIL) 10 MG tablet Take 10 mg by mouth 2 (two) times a week.        . furosemide (LASIX) 20 MG tablet Take 20 mg by mouth daily.        . isosorbide-hydrALAZINE (BIDIL) 20-37.5 MG per tablet Take 1 tablet by mouth 3 (three) times daily.        . metFORMIN (GLUCOPHAGE) 500 MG tablet Take 500 mg by mouth 2 (two) times daily with a meal.        . Multiple Vitamin (MULTIVITAMIN) capsule Take 1 capsule by mouth daily.        . naproxen sodium (ANAPROX) 220 MG tablet Take 220 mg by mouth as needed.        . ramipril (ALTACE) 10 MG tablet Take 10 mg by mouth daily.        Marland Kitchen spironolactone (ALDACTONE) 50 MG tablet Take 50 mg by mouth daily.        Marland Kitchen warfarin (COUMADIN) 7.5 MG tablet Take 7.5 mg by mouth daily.         No Known Allergies  History  Substance Use Topics  . Smoking status: Former Smoker    Types: Cigarettes    Quit date: 12/19/2005  .  Smokeless tobacco: Not on file  . Alcohol Use: 6.6 oz/week    6 Glasses of wine, 0 Cans of beer, 5 Shots of liquor, 0 Drinks containing 0.5 oz of alcohol per week    History reviewed. No pertinent family history.   Medications:  Review of Systems   Objective: Vital signs in last 24 hours: Temp:  [98.8 F (37.1 C)-99.2 F (37.3 C)] 99.2 F (37.3 C) (11/09 1625) Pulse Rate:  [55-120] 119  (11/09 2330) Resp:  [12-27] 19  (11/09 2330) BP: (131-225)/(92-153) 178/114 mmHg (11/09 2330) SpO2:  [93 %-100 %] 97 % (11/09 2330) FiO2 (%):  [34.8 %-40.3 %] 35 % (11/09 1000) Weight:  [99.2 kg (218 lb 11.1 oz)] 218 lb 11.1 oz (99.2 kg) (11/09 0500)  General Exam:  Cardiac: tachycardia  Neurological Exam:  Mentation: patient is in coma, does not responsive to pain.  CN II-XII: Pupils were 2mm very sluggish reactive to light. Weak corneal bilaterally, weak bilateral doll's eye, breathing at 18/minutes, congested.  Motor/Sensory: no withdraw to pain     Data Review  Lab Result:  Results for orders placed during the hospital encounter of  11/19/10 (from the past 48 hour(s))  GLUCOSE, CAPILLARY     Status: Abnormal   Collection Time   11/20/10 12:34 AM      Component Value Range Comment   Glucose-Capillary 133 (*) 70 - 99 (mg/dL)   GLUCOSE, CAPILLARY     Status: Abnormal   Collection Time   11/20/10  3:43 AM      Component Value Range Comment   Glucose-Capillary 128 (*) 70 - 99 (mg/dL)   BASIC METABOLIC PANEL     Status: Abnormal   Collection Time   11/20/10  4:00 AM      Component Value Range Comment   Sodium 134 (*) 135 - 145 (mEq/L)    Potassium 3.5  3.5 - 5.1 (mEq/L)    Chloride 102  96 - 112 (mEq/L)    CO2 22  19 - 32 (mEq/L)    Glucose, Bld 127 (*) 70 - 99 (mg/dL)    BUN 23  6 - 23 (mg/dL)    Creatinine, Ser 1.61 (*) 0.50 - 1.35 (mg/dL)    Calcium 9.7  8.4 - 10.5 (mg/dL)    GFR calc non Af Amer 55 (*) >90 (mL/min)    GFR calc Af Amer 64 (*) >90 (mL/min)   HEPARIN LEVEL     Status: Abnormal   Collection Time   11/20/10  4:00 AM      Component Value Range Comment   Heparin Unfractionated <0.10 (*) 0.30 - 0.70 (IU/mL)   CBC     Status: Abnormal   Collection Time   11/20/10  4:00 AM      Component Value Range Comment   WBC 6.4  4.0 - 10.5 (K/uL)    RBC 4.05 (*) 4.22 - 5.81 (MIL/uL)    Hemoglobin 11.8 (*) 13.0 - 17.0 (g/dL)    HCT 09.6 (*) 04.5 - 52.0 (%)    MCV 87.7  78.0 - 100.0 (fL)    MCH 29.1  26.0 - 34.0 (pg)    MCHC 33.2  30.0 - 36.0 (g/dL)    RDW 40.9  81.1 - 91.4 (%)    Platelets 235  150 - 400 (K/uL)   BLOOD GAS, ARTERIAL     Status: Abnormal   Collection Time   11/20/10  4:15 AM      Component Value Range Comment  FIO2 .50      Delivery systems VENTILATOR      Mode PRESSURE REGULATED VOLUME CONTROL      VT 560      Rate 12      Peep/cpap 5.0      pH, Arterial 7.379  7.350 - 7.450     pCO2 arterial 40.1  35.0 - 45.0 (mmHg)    pO2, Arterial 126.0 (*) 80.0 - 100.0 (mmHg)    Bicarbonate 23.1  20.0 - 24.0 (mEq/L)    TCO2 24.3  0 - 100 (mmol/L)    Acid-base deficit 1.3  0.0 - 2.0 (mmol/L)     O2 Saturation 98.7      Patient temperature 98.6      Collection site RIGHT RADIAL      Drawn by [phone]      Sample type ARTERIAL DRAW      Allens test (pass/fail) PASS  PASS    GLUCOSE, CAPILLARY     Status: Abnormal   Collection Time   11/20/10  7:41 AM      Component Value Range Comment   Glucose-Capillary 124 (*) 70 - 99 (mg/dL)   CULTURE, RESPIRATORY     Status: Normal (Preliminary result)   Collection Time   11/20/10  9:01 AM      Component Value Range Comment   Specimen Description TRACHEAL ASPIRATE      Special Requests NONE      Gram Stain        Value: RARE WBC PRESENT,BOTH PMN AND MONONUCLEAR     NO SQUAMOUS EPITHELIAL CELLS SEEN     FEW GRAM POSITIVE COCCI     IN PAIRS RARE GRAM POSITIVE RODS     RARE GRAM NEGATIVE COCCI   Culture Culture reincubated for better growth      Report Status PENDING     CULTURE, BLOOD (ROUTINE X 2)     Status: Normal (Preliminary result)   Collection Time   11/20/10 10:00 AM      Component Value Range Comment   Specimen Description BLOOD ARM LEFT      Special Requests BOTTLES DRAWN AEROBIC AND ANAEROBIC 10CC EACH      Setup Time [id number]      Culture        Value:        BLOOD CULTURE RECEIVED NO GROWTH TO DATE CULTURE WILL BE HELD FOR 5 DAYS BEFORE ISSUING A FINAL NEGATIVE REPORT   Report Status PENDING     CULTURE, BLOOD (ROUTINE X 2)     Status: Normal (Preliminary result)   Collection Time   11/20/10 10:20 AM      Component Value Range Comment   Specimen Description BLOOD HAND LEFT      Special Requests BOTTLES DRAWN AEROBIC ONLY 5.0 CC      Setup Time [id number]      Culture        Value:        BLOOD CULTURE RECEIVED NO GROWTH TO DATE CULTURE WILL BE HELD FOR 5 DAYS BEFORE ISSUING A FINAL NEGATIVE REPORT   Report Status PENDING     CARDIAC PANEL(CRET KIN+CKTOT+MB+TROPI)     Status: Abnormal   Collection Time   11/20/10 11:09 AM      Component Value Range Comment   Total CK 107  7 - 232 (U/L)    CK, MB 2.9  0.3 - 4.0  (ng/mL)    Troponin I <0.30  <0.30 (ng/mL)    Relative  Index 2.7 (*) 0.0 - 2.5    TSH     Status: Normal   Collection Time   11/20/10 11:09 AM      Component Value Range Comment   TSH 0.895  0.350 - 4.500 (uIU/mL)   T4, FREE     Status: Abnormal   Collection Time   11/20/10 11:09 AM      Component Value Range Comment   Free T4 1.82 (*) 0.80 - 1.80 (ng/dL)   HEMOGLOBIN Z6X     Status: Abnormal   Collection Time   11/20/10 11:09 AM      Component Value Range Comment   Hemoglobin A1C 8.2 (*) <5.7 (%)    Mean Plasma Glucose 189 (*) <117 (mg/dL)   LIPID PANEL     Status: Abnormal   Collection Time   11/20/10 11:09 AM      Component Value Range Comment   Cholesterol 203 (*) 0 - 200 (mg/dL)    Triglycerides 096 (*) <150 (mg/dL)    HDL 51  >04 (mg/dL)    Total CHOL/HDL Ratio 4.0      VLDL 32  0 - 40 (mg/dL)    LDL Cholesterol 540 (*) 0 - 99 (mg/dL)   GLUCOSE, CAPILLARY     Status: Abnormal   Collection Time   11/20/10 11:28 AM      Component Value Range Comment   Glucose-Capillary 219 (*) 70 - 99 (mg/dL)   CARDIAC PANEL(CRET KIN+CKTOT+MB+TROPI)     Status: Normal   Collection Time   11/20/10  3:42 PM      Component Value Range Comment   Total CK 95  7 - 232 (U/L)    CK, MB 2.2  0.3 - 4.0 (ng/mL)    Troponin I <0.30  <0.30 (ng/mL)    Relative Index RELATIVE INDEX IS INVALID  0.0 - 2.5    HEPARIN LEVEL     Status: Abnormal   Collection Time   11/20/10  3:43 PM      Component Value Range Comment   Heparin Unfractionated <0.10 (*) 0.30 - 0.70 (IU/mL)   PROTIME-INR     Status: Abnormal   Collection Time   11/20/10  3:43 PM      Component Value Range Comment   Prothrombin Time 24.9 (*) 11.6 - 15.2 (seconds)    INR 2.21 (*) 0.00 - 1.49    GLUCOSE, CAPILLARY     Status: Abnormal   Collection Time   11/20/10  4:57 PM      Component Value Range Comment   Glucose-Capillary 150 (*) 70 - 99 (mg/dL)   HEPARIN LEVEL     Status: Abnormal   Collection Time   11/20/10 11:49 PM      Component  Value Range Comment   Heparin Unfractionated 0.71 (*) 0.30 - 0.70 (IU/mL)   GLUCOSE, CAPILLARY     Status: Abnormal   Collection Time   11/21/10 12:33 AM      Component Value Range Comment   Glucose-Capillary 141 (*) 70 - 99 (mg/dL)   HEPARIN LEVEL     Status: Normal   Collection Time   11/21/10  3:30 AM      Component Value Range Comment   Heparin Unfractionated 0.52  0.30 - 0.70 (IU/mL)   CBC     Status: Abnormal   Collection Time   11/21/10  3:30 AM      Component Value Range Comment   WBC 8.4  4.0 - 10.5 (K/uL)  RBC 4.23  4.22 - 5.81 (MIL/uL)    Hemoglobin 12.5 (*) 13.0 - 17.0 (g/dL)    HCT 81.1 (*) 91.4 - 52.0 (%)    MCV 88.2  78.0 - 100.0 (fL)    MCH 29.6  26.0 - 34.0 (pg)    MCHC 33.5  30.0 - 36.0 (g/dL)    RDW 78.2  95.6 - 21.3 (%)    Platelets 248  150 - 400 (K/uL)   BASIC METABOLIC PANEL     Status: Abnormal   Collection Time   11/21/10  3:30 AM      Component Value Range Comment   Sodium 140  135 - 145 (mEq/L)    Potassium 3.6  3.5 - 5.1 (mEq/L)    Chloride 101  96 - 112 (mEq/L)    CO2 26  19 - 32 (mEq/L)    Glucose, Bld 131 (*) 70 - 99 (mg/dL)    BUN 21  6 - 23 (mg/dL)    Creatinine, Ser 0.86 (*) 0.50 - 1.35 (mg/dL)    Calcium 9.8  8.4 - 10.5 (mg/dL)    GFR calc non Af Amer 45 (*) >90 (mL/min)    GFR calc Af Amer 52 (*) >90 (mL/min)   PRO B NATRIURETIC PEPTIDE     Status: Abnormal   Collection Time   11/21/10  3:30 AM      Component Value Range Comment   BNP, POC 666.0 (*) 0 - 125 (pg/mL)   GLUCOSE, CAPILLARY     Status: Abnormal   Collection Time   11/21/10  8:55 AM      Component Value Range Comment   Glucose-Capillary 152 (*) 70 - 99 (mg/dL)   HEPARIN LEVEL     Status: Normal   Collection Time   11/21/10  9:33 AM      Component Value Range Comment   Heparin Unfractionated 0.39  0.30 - 0.70 (IU/mL)   GLUCOSE, CAPILLARY     Status: Abnormal   Collection Time   11/21/10 12:58 PM      Component Value Range Comment   Glucose-Capillary 184 (*) 70 - 99  (mg/dL)   GLUCOSE, CAPILLARY     Status: Abnormal   Collection Time   11/21/10  4:23 PM      Component Value Range Comment   Glucose-Capillary 128 (*) 70 - 99 (mg/dL)   BLOOD GAS, ARTERIAL     Status: Abnormal   Collection Time   11/21/10  4:55 PM      Component Value Range Comment   FIO2 .21      pH, Arterial 7.472 (*) 7.350 - 7.450     pCO2 arterial 35.9  35.0 - 45.0 (mmHg)    pO2, Arterial 60.2 (*) 80.0 - 100.0 (mmHg)    Bicarbonate 25.9 (*) 20.0 - 24.0 (mEq/L)    TCO2 27.0  0 - 100 (mmol/L)    Acid-Base Excess 2.5 (*) 0.0 - 2.0 (mmol/L)    O2 Saturation 91.9      Patient temperature 98.6      Collection site RADIAL      Drawn by [id number]      Sample type ARTERIAL      Allens test (pass/fail) PASS  PASS    BASIC METABOLIC PANEL     Status: Abnormal   Collection Time   11/21/10  5:03 PM      Component Value Range Comment   Sodium 137  135 - 145 (mEq/L)    Potassium 3.8  3.5 - 5.1 (mEq/L)    Chloride 98  96 - 112 (mEq/L)    CO2 25  19 - 32 (mEq/L)    Glucose, Bld 160 (*) 70 - 99 (mg/dL)    BUN 17  6 - 23 (mg/dL)    Creatinine, Ser 0.98  0.50 - 1.35 (mg/dL)    Calcium 11.9 (*) 8.4 - 10.5 (mg/dL)    GFR calc non Af Amer 58 (*) >90 (mL/min)    GFR calc Af Amer 67 (*) >90 (mL/min)   GLUCOSE, CAPILLARY     Status: Abnormal   Collection Time   11/21/10  7:30 PM      Component Value Range Comment   Glucose-Capillary 197 (*) 70 - 99 (mg/dL)   BLOOD GAS, ARTERIAL     Status: Abnormal   Collection Time   11/21/10  7:35 PM      Component Value Range Comment   FIO2 .28      Delivery systems NASAL CANNULA      pH, Arterial 7.497 (*) 7.350 - 7.450     pCO2 arterial 33.5 (*) 35.0 - 45.0 (mmHg)    pO2, Arterial 81.9  80.0 - 100.0 (mmHg)    Bicarbonate 25.7 (*) 20.0 - 24.0 (mEq/L)    TCO2 26.7  0 - 100 (mmol/L)    Acid-Base Excess 2.6 (*) 0.0 - 2.0 (mmol/L)    O2 Saturation 97.1      Patient temperature 98.6      Collection site LEFT RADIAL      Drawn by COLLECTED BY RT      Sample  type ARTERIAL DRAW      Allens test (pass/fail) PASS  PASS      Total I/O In: 463 [I.V.:413; IV Piggyback:50] Out: 625 [Urine:625]                         Radiology Results:  EKG: Orders placed in visit on 11/19/10  . EKG 12-LEAD      Assessment/Plan: 55 yo with right cerebellum stroke,  Acute neurological worsening and CT findings.  1. Repeat CT head in am. 2. Intubate hyperventilate 3. Reverse anticoagulopathy.

## 2010-11-22 NOTE — Procedures (Signed)
Reason for procedure: Hydrocephalus due to cerebellar infarct  Under sterile conditions patient's head was prepped and draped. 10 cc 1% lidocaine with epinephrine was injected into the incision site. Incision was made the right frontal area. He can drill was used to drill through the skull and a ventriculostomy was placed into the ventricle.the ventticular catheter was brought out through separate stab wound incision. The ventricular catheter was connected to a drainage bag. CSF was clear pressure was high. Incision was stapled dressing was placed. The drain ventriculostomy 12 cm above the external auditory canal.

## 2010-11-22 NOTE — Progress Notes (Signed)
[name] is a 55 y.o. male former smoker admitted on 11/19/2010 with acute onset of slurred speech and Lt sided weakness.  Intubated in ED for airway protection and pulmonary edema.  Had spontaneous resolution of neuro symptoms. PMHx DM, CAD, CHF, HTN, Gout, A fib  Line/tubes: ETT 11/7>>11/9, 11/10 (cerebellar hge) >> Ventric 11/10 >>  Abx: Rocephin 11/8>> Zithromax 11/8>>  Cx: Blood 11/8>> Sputum 11/8>>  Best practice: Pepcid Heparin gtt/coumadin for A fib >>  stopped 11/19   Consults: Neuro Cariology [name])  Tests/events: 11/7: CT head>>negative 11/7: CT chest>>b/l GGO more at bases, small b/l effusions, no PE, dependent ATX 11/8: MRI head w/o contrast>>Acute/subacute non-hemorrhagic infarct Rt superior cerebellar artery territory, remote Lt cerebellar infarct, multiple punctated areas of remote hemorrhage compatible with amyloid angiopathy 11/8: Echo>>severe LVH, EF 25 to 35%, mild AR, mild MR, PAS 11/10 - CT head am: marked hgic transformation of rt cerebaellar infarct -> supratentorial hydrocephalus -> ventric drain   SUBJECTIVE: Intubated early hours this am due to cerebellar hge Currently GCS 5. No sedation -s/p ventric drain Got FFP   OBJECTIVE:  Blood pressure 158/107, pulse 109, temperature 99.3 F (37.4 C), temperature source Oral, resp. rate 18, height 5\' 9"  (1.753 m), weight 97.1 kg (214 lb 1.1 oz), SpO2 99.00%.   Intake/Output Summary (Last 24 hours) at 11/22/10 1457 Last data filed at 11/22/10 1414  Gross per 24 hour  Intake 3068.17 ml  Output   4141 ml  Net -1072.83 ml   General - obese, no distress HEENT - Pupils reactive Cardiac - irregular Chest - scattered rhonchi Abd - soft, non-tender Ext - no edema but some bruises in left arm and forearm ? Related to high INR and bp cuff Neuro - GCS 3.   Lab Results  Component Value Date   CREATININE 1.20 11/22/2010   BUN 16 11/22/2010   NA 140 11/22/2010   K 3.0* 11/22/2010   CL 99  11/22/2010   CO2 26 11/22/2010   CBC    Component Value Date/Time   WBC 11.0* 11/22/2010 0630   RBC 4.91 11/22/2010 0630   HGB 15.0 11/22/2010 0630   HCT 43.1 11/22/2010 0630   PLT 270 11/22/2010 0630   MCV 87.8 11/22/2010 0630   MCH 30.5 11/22/2010 0630   MCHC 34.8 11/22/2010 0630   RDW 15.0 11/22/2010 0630   LYMPHSABS 5.0* 11/19/2010 1715   MONOABS 0.7 11/19/2010 1715   EOSABS 0.3 11/19/2010 1715   BASOSABS 0.0 11/19/2010 1715   Lab Results  Component Value Date   TSH 0.895 11/20/2010   FreeT4>>1.82 11/20/2010   ASSESSMENT/PLAN:  #Cerebellar Hemorrhage/COma   - per neurology and neurosurgery   - prognosis very poor  #Acute resp failure - full vent support  #A. Fib -hepario stopped following cerebellar hemorrhage   #Hx of CAD -monitor  #Gout -will need to resume allopurinol, colchicine when able to take oral medication  #Rt lower ASD with ?PNA -d3/x rocephin, zithromax -await blood cx, sputum culture  # ? Thyroiditis -CT images d/w radiology with concerns for thyroid inflammation -f/u mild elevation in FT4, and low normal TSH>>will monitor for now  Disposition -keep in ICU - spoke to 4 family members at bedside - 2 men who identified themseves as brothers and 2 women. They said they were next of family. THey all appeared visibly upset and distressed. Lot of questions on neuro prognosis. Explained that overall situation is very critical but in terms of neuro recovery and outlook suggested  family meeting with neurologist  Critical care time 40 minutes.  Nimco Bivens Pager:  [phone] 11/22/2010, 2:57 PM

## 2010-11-22 NOTE — Progress Notes (Signed)
Pt recently intubated and currently sedated.  PT will sign off at this time.  Please reorder if appropriate. [place], [place], [place]  [phone]

## 2010-11-22 NOTE — Progress Notes (Signed)
Subjective:  he was extubated yesterday in the morning and did well throughout the day but at 7 PM was found to be obtunded with sudden onset of change in his neurological exam. A stat CT scan of the head showed a hemorrhagic transformation in the large right cerebellar infarct with brainstem compression, cerebral edema and hydrocephalus. He was reintubated. He was found to be auto anticoagulated with INR of 2.2. Heparin was stopped and anticoagulation  was reversed with FFP. Repeat INR this morning is 1.2.Neurosugical   consult was obtained but patient was not felt to be a surgical candidate because of anticoagulation. Patient's family is not available at the bedside and discuss his plan of care his prognosis remains poor.  Objective: Vital signs in last 24 hours: Temp:  [99.2 F (37.3 C)-100.3 F (37.9 C)] 100.2 F (37.9 C) (11/10 0809) Pulse Rate:  [55-130] 114  (11/10 0800) Resp:  [15-27] 19  (11/10 0800) BP: (139-225)/(85-153) 153/93 mmHg (11/10 0800) SpO2:  [93 %-100 %] 100 % (11/10 0800) FiO2 (%):  [34.8 %-100 %] 40.1 % (11/10 0800) Weight:  [97.1 kg (214 lb 1.1 oz)] 214 lb 1.1 oz (97.1 kg) (11/10 0500) Weight change: -2.1 kg (-4 lb 10.1 oz)    Intake/Output from previous day: 11/09 0701 - 11/10 0700 In: 2947.2 [I.V.:1786.5; Blood:700.7; NG/GT:60; IV Piggyback:400] Out: 5150 [Urine:5150] Intake/Output this shift: Total I/O In: 90 [I.V.:90] Out: 225 [Urine:225]  Neurological exam : patient is intubated he is not on any sedation. His is comatose and unresponsive. Pupils are 3 mm sluggishly reactive. Doll's eye movements are present. He has slight left gaze deviation. Corneal refelexes are present. He has a weak cough and gag reflex. There are no spontaneous extremity movements noted. He has minimal posturing of the left upper and lower extremity to pain. There is no withdrawal in the right approximately. There is minimal withdrawal in both lower extremities to pain. Both plantars  are  upgoing.  Lab Results:  Basename 11/22/10 0630 11/21/10 0330  WBC 11.0* 8.4  HGB 15.0 12.5*  HCT 43.1 37.3*  PLT 270 248   BMET  Basename 11/22/10 0630 11/21/10 1703  NA 140 137  K 3.0* 3.8  CL 99 98  CO2 26 25  GLUCOSE 152* 160*  BUN 16 17  CREATININE 1.20 1.34  CALCIUM 10.3 10.6*    Studies/Results: Ct Head Wo Contrast  11/21/2010  *RADIOLOGY REPORT*  Clinical Data:   Altered mental status  CT HEAD WITHOUT CONTRAST  Technique:  Contiguous axial images were obtained from the base of the skull through the vertex without contrast.  Comparison: Brain MRI 11/20/2010 and 07/2010  Findings: The  large infarction within the left cerebellum is now evident  on CT. The infarction involves the entirety of the right cerebral hemisphere and extends to the vermis.  There is increased effacement of the fourth ventricle compared to MRI one day prior. There is increase in the ventricular volume with the temporal horns more conspicuous than on prior.  There is no evidence of intracranial hemorrhage.  No midline shift.  IMPRESSION:  1. Large right cerebellar infarction. 2.  Edema from the infarcted cerebellum compresses the fourth ventricle with mild obstructive hydrocephalus increased from 1 day prior.  Findings conveyed to Dr. Maple Hudson tube on 11/21/2010 at 2023 hours  Original Report Authenticated By: Genevive Bi, M.D.   Mr Brain Wo Contrast  11/21/2010  *RADIOLOGY REPORT*  Clinical Data: Stroke.  Left-sided weakness and dizziness.  MRI HEAD WITHOUT CONTRAST  Technique:  Multiplanar, multiecho pulse sequences of the brain and surrounding structures were obtained according to standard protocol without intravenous contrast.  Comparison: CTA head 11/19/2010.  CT head without contrast 11/19/2010.  Findings: The diffusion weighted images demonstrate an acute / subacute non hemorrhagic infarct involving the right superior cerebellar artery territory.  There is focal infarction along the posterior aspect of  the right pons and inferior mid brain, near the inferior colliculus.  The left cerebellar infarct is remote.  No other areas of acute infarction are seen.  Multiple punctate areas of remote hemorrhage are evident within the basal ganglia bilaterally.  There are punctate areas of hemorrhage within the brainstem and cerebellum as well.  These appear remote and raise suspicion for amyloid angiopathy.  Abnormal flow signal is present within the left vertebral artery at the dural margin, as seen on the CT angiogram.  This is compatible with slow flow and high-grade stenosis or partial occlusion.  Flow is present in the anterior circulation.  The globes orbits are intact.  The paranasal sinuses and mastoid air cells are clear.  IMPRESSION:  1.  Acute / sub acute non hemorrhagic infarct involving the right superior cerebellar artery territory, including the posterolateral inferior mid brain and upper pons. 2.  Remote lacunar infarct of the left cerebellum. 3.  Multiple punctate areas of remote hemorrhage, compatible with amyloid angiopathy.  Original Report Authenticated By: Jamesetta Orleans. MATTERN, M.D.   Dg Chest Portable 1 View  11/22/2010  *RADIOLOGY REPORT*  Clinical Data: Endotracheal tube placement.  PORTABLE CHEST - 1 VIEW  Comparison: Chest radiograph performed 11/21/2010  Findings: The patient's endotracheal tube is seen ending 2-3 cm above the carina.  The lungs are well-aerated.  Obscuration of the right hemidiaphragm may reflect residual edema, though pneumonia cannot be entirely excluded.  Mild vascular congestion is noted.  There is no evidence of pleural effusion or pneumothorax.  The cardiomediastinal silhouette is enlarged.  No acute osseous abnormalities are seen.  IMPRESSION:  1.  Endotracheal tube seen ending 2-3 cm above the carina. 2.  Obscuration of the right hemidiaphragm may reflect residual edema, though pneumonia cannot be entirely excluded. 3.  Mild vascular congestion and cardiomegaly  noted.  Original Report Authenticated By: Tonia Ghent, M.D.   Dg Chest Portable 1 View  11/21/2010  *RADIOLOGY REPORT*  Clinical Data: Follow up pulmonary edema  PORTABLE CHEST - 1 VIEW  Comparison: 11/20/2010  Findings: The ET tube tip is above the carina.  There is a nasogastric tube with side port below GE junction.  The heart size appears enlarged.  There has been interval improvement in bilateral pulmonary edema and effusions.  No new findings are identified.  IMPRESSION:  1.  Interval improvement and pulmonary edema pattern.  Original Report Authenticated By: Rosealee Albee, M.D.   Ct Portable Head W/o Cm  11/22/2010  *RADIOLOGY REPORT*  Clinical Data: Follow up large right cerebellar infarct.  CT HEAD WITHOUT CONTRAST  Technique:  Contiguous axial images were obtained from the base of the skull through the vertex without contrast.  Comparison: CT of the head performed 11/21/2010, and MRI of the brain performed 11/20/2010  Findings:   On comparison with the prior MRI, CTA of the head performed 11/19/2010, and most recent head CT, there is increasing attenuation of central density at the right cerebellar hemisphere. This is highly suspicious for hemorrhagic transformation of the patient's right cerebellar infarct, especially given that the areas of increased attenuation appear to have mildly increased  in size since the prior study.  Underlying right cerebellar vasogenic edema is again noted, with worsening mass effect on the right pons. There is effacement of the cisterns about the pons, raising concern for transtentorial herniation.  The fourth ventricle appears completely effaced, with mildly worsened dilatation of the third and lateral ventricles, compatible with hydrocephalus.  Gray-white differentiation within the cerebral hemispheres appears grossly preserved.  There is no evidence of fracture; visualized osseous structures are unremarkable in appearance.  The orbits are within normal limits.  The paranasal sinuses and mastoid air cells are well-aerated.  No significant soft tissue abnormalities are seen.  IMPRESSION:  1.  Suspect marked hemorrhagic transformation of right cerebellar infarct; the amount of intraparenchymal hemorrhage has mildly increased from the prior study. 2.  Mildly worsened right cerebellar vasogenic edema, with worsening mass effect on the right pons and effacement of the cisterns, raising concern for transtentorial herniation. 3.  Effacement of the fourth ventricle, with mildly worsened supratentorial hydrocephalus.  Findings were discussed with Dr. Levert Feinstein on [id number] at 06:06 a.m. on 11/22/2010.  Original Report Authenticated By: Tonia Ghent, M.D.      Medications: I have reviewed the patient's current medications.  Assessment/Plan: [name] is an 56 y.o. year old male with left-sided weakness and dizziness likely a right hemispheric TIA versus a small right brain infarct of cardioembolic etiology secondary to atrial fibrillation with suboptimal anticoagulation on Coumadin with INR of 1.7 on admission. CT angiogram shows severe basilar atherosclerotic posterior circulation disease . Pulmonary edema and respiratory failure requiring intubation  . He was extubated yesterday morning and did well for several hours but yesterday evening developed sudden onset of neurochange and was found to be obtunded. Repeat CT scan shows hemorrhagic transformation in the large right cerebellar infarct with brainstem compression, hydrocephalus and cerebral edema.His exam is quite poor at present.He did not undergo emergent neurosurgical intervention secondary to being autoanticoagulated  with INR of 2.2 which has since been reversed.Repeat CT scan from this morning show slightly increased  Hemorrhagic infarct with brainstem compression and hydrocephalus  Plan : patient's prognosis is quite poor given the extent of his stroke, cerebral edema and brainstem compression. Patient may  benefit with decompressive occipital craniotomy to relieve intracranial pressure. He will need management of increased intracranial pressure with hypertonic  Saline and prolonged ventilatory support.Marland Kitchen He will need central line for that. He is likely to need prolonged ventilatory support, tracheostomy and PEG tube placement and skilled nursing placement at the best. This will need to discussed with the patient's next of kin and family members before a final decision will be made. The patient is medically ill and significant risk for neurological worsening, hydrocephalus and death. His care requires frequent neurological monitoring, hemodynamic monitoring, ventilatory support and medical decision making of high complexity. I spent 30 minutes of neurological  critical care time in involvement of this patient's care.  Will discuss plan of care with CCM MD , Neurosurgery and family Patient Active Problem List   Diagnoses   .  Stroke   .  Hypertensive emergency   .  Pulmonary edema   .  Acute exacerbation of congestive heart failure   .  Hypoxemia   .  Diabetes mellitus   .  Respiratory failure   .  A-fib   .  Gout   .  Encephalopathy   .  CAD (coronary artery disease)   .  Pneumonia, organism     LOS: 3 days  Porchea Charrier,PRAMODKUMAR P 11/22/2010, 9:00 AM

## 2010-11-22 NOTE — Progress Notes (Signed)
INITIAL ADULT NUTRITION ASSESSMENT Date: 11/22/2010   Time: 11:52 AM Reason for Assessment: vent  ASSESSMENT: Male 55 y.o.  Dx: Stroke  Hx:   Past Medical History  Diagnosis Date  . Diabetes mellitus   . Coronary artery disease   . Hypertension   . Gout   . CHF (congestive heart failure)   . Afib   . Arthritis     Related Meds:   Scheduled Meds:   . amLODipine  5 mg Oral Daily  . antiseptic oral rinse  15 mL Mouth Rinse QID  . azithromycin  500 mg Intravenous Q24H  . carvedilol  25 mg Oral BID WC  . cefTRIAXone (ROCEPHIN) IV  1 g Intravenous Q24H  . chlorhexidine  15 mL Mouth Rinse BID  . cloNIDine  0.1 mg Oral BID  . etomidate  20 mg Intravenous Once  . famotidine (PEPCID) IV  20 mg Intravenous Q12H  . furosemide  40 mg Intravenous Daily  . insulin aspart  0-4 Units Subcutaneous Q4H  . isosorbide-hydrALAZINE  1 tablet Oral TID  . phytonadione  10 mg Intramuscular Once  . ramipril  10 mg Oral q12n4p  . simvastatin  20 mg Oral q1800  . spironolactone  50 mg Oral Daily  . succinylcholine  20 mg Intravenous STAT  . DISCONTD: insulin aspart  0-4 Units Subcutaneous Q4H  . DISCONTD: insulin aspart  0-4 Units Subcutaneous Q4H  . DISCONTD: ramipril  10 mg Oral Daily   Continuous Infusions:   . dextrose    . niCARDipine 6 mg/hr (11/22/10 1134)  . sodium chloride    . DISCONTD: sodium chloride 10 mL/hr at 11/21/10 1730  . DISCONTD: dextrose    . DISCONTD: dextrose    . DISCONTD: heparin 17 mL/hr (11/21/10 2300)  . DISCONTD: insulin (NOVOLIN-R) infusion    . DISCONTD: niCARDipine 5 mg/hr (11/21/10 1730)  . DISCONTD: niCARDipine     PRN Meds:.sodium chloride, acetaminophen, labetalol, metoprolol, DISCONTD: labetalol   Ht: 5\' 9"  (175.3 cm)  Wt: 214 lb 1.1 oz (97.1 kg)  Ideal Wt: 73.7kg % Ideal Wt: 131%  Usual Wt: unknown per family % Usual Wt:  Body mass index is 31.61 kg/(m^2).  Food/Nutrition Related Hx: Admitted with stroke.  Pt extubated yesterday,  however was not able to sustain requiring reintubation.  Per family pt eating well PTA.  Had recently made changes to his diet- sounds more therapeutic (i.e. Reduced sodium) from talking to family.  Pt does not have access for enteral feeds at this time.  Would require OG/NGT placement.  Noted MD notes re: prognosis and long term needs.  Discussed with family the provision of nutrition will be based on care plan and prioritization of needs at this time, but that recommendations will be made and monitored by unit RD. Tm:  37.9C   MVe:  10.1 L/mL  Labs:   CMP     Component Value Date/Time   NA 140 11/22/2010 0630   K 3.0* 11/22/2010 0630   CL 99 11/22/2010 0630   CO2 26 11/22/2010 0630   GLUCOSE 152* 11/22/2010 0630   BUN 16 11/22/2010 0630   CREATININE 1.20 11/22/2010 0630   CALCIUM 10.3 11/22/2010 0630   PROT 7.9 11/19/2010 1715   ALBUMIN 3.4* 11/19/2010 1715   AST 32 11/19/2010 1715   ALT 25 11/19/2010 1715   ALKPHOS 73 11/19/2010 1715   BILITOT 0.4 11/19/2010 1715   GFRNONAA 66* 11/22/2010 0630   GFRAA 76* 11/22/2010 0630  Intake:  Output: No documented BMs.  Pos UOP, O>I  Diet Order: NPO  Supplements/Tube Feeding: none at this time.  IVF:    dextrose   niCARDipine Last Rate: 6 mg/hr (11/22/10 1134)  sodium chloride   DISCONTD: sodium chloride Last Rate: 10 mL/hr at 11/21/10 1730  DISCONTD: dextrose   DISCONTD: dextrose   DISCONTD: heparin Last Rate: 17 mL/hr (11/21/10 2300)  DISCONTD: insulin (NOVOLIN-R) infusion   DISCONTD: niCARDipine Last Rate: 5 mg/hr (11/21/10 1730)  DISCONTD: niCARDipine     Estimated Nutritional Needs:   Kcal: 1935-2260 kcal Protein: 99-120 g Fluid: ~2.3L/day  NUTRITION DIAGNOSIS: -Inadequate oral intake (NI-2.1).  Status: Ongoing  RELATED TO: mechanical ventilation  AS EVIDENCE BY: pt intubated, NPO  MONITORING/EVALUATION(Goals): 1.  Enteral nutrition; initiation with tolerance if appropriate.  EDUCATION NEEDS: -Education  needs addressed  INTERVENTION: 1.  Enteral nutrition; if appropriate per MD and family wishes and access obtained, recommend initiation of Jevity 1.2 ar 20 mL/hr continuous.  Advance by 10 mL/hr to 70 mL/hr goal to provide 2016 kcal, 93g protein, 1377 mL free water. 2.  Supplements; Prostat one daily  Total nutrition support to provide:  2088 kcal, 108g protein, 1377 mL free water.  Dietitian #: [phone]  DOCUMENTATION CODES Per approved criteria  Obesity (unspecified)    Loyce Dys Sue-Ellen 11/22/2010, 11:52 AM

## 2010-11-22 NOTE — Procedures (Signed)
Intubation Procedure Note [name] [id number] Apr 16, 1955  Procedure: Intubation Indications: Airway protection.  Procedure Details Consent: Emergency procedure Time Out: Verified patient identification, verified procedure, site/side was marked, verified correct patient position, special equipment/implants available, medications/allergies/relevent history reviewed, required imaging and test results available.   Maximum sterile technique was used including.Laryngoscope size 7.5    Evaluation Hemodynamic Status:Normal  Patient's Current Condition: Stable Complications: None Patient did tolerate procedure well. Chest X-ray ordered to verify placement.  CXR: Pending.   [name] 11/22/2010

## 2010-11-22 NOTE — Progress Notes (Signed)
SLP Cancellation Note  Unable to complete SLP evaluation/treatment secondary to re-intubation.  Please re-order SLP services when appropriate.  D/C SLP services.  Myra Rude, M.S.,CCC-SLP Pager [phone]

## 2010-11-23 DIAGNOSIS — R4020 Unspecified coma: Secondary | ICD-10-CM

## 2010-11-23 DIAGNOSIS — I629 Nontraumatic intracranial hemorrhage, unspecified: Secondary | ICD-10-CM

## 2010-11-23 DIAGNOSIS — I6340 Cerebral infarction due to embolism of unspecified cerebral artery: Secondary | ICD-10-CM

## 2010-11-23 DIAGNOSIS — J9600 Acute respiratory failure, unspecified whether with hypoxia or hypercapnia: Secondary | ICD-10-CM

## 2010-11-23 LAB — GLUCOSE, CAPILLARY
Glucose-Capillary: 151 mg/dL — ABNORMAL HIGH (ref 70–99)
Glucose-Capillary: 159 mg/dL — ABNORMAL HIGH (ref 70–99)
Glucose-Capillary: 162 mg/dL — ABNORMAL HIGH (ref 70–99)
Glucose-Capillary: 169 mg/dL — ABNORMAL HIGH (ref 70–99)
Glucose-Capillary: 186 mg/dL — ABNORMAL HIGH (ref 70–99)

## 2010-11-23 LAB — SODIUM
Sodium: 142 mEq/L (ref 135–145)
Sodium: 148 mEq/L — ABNORMAL HIGH (ref 135–145)
Sodium: 150 mEq/L — ABNORMAL HIGH (ref 135–145)

## 2010-11-23 LAB — CULTURE, RESPIRATORY W GRAM STAIN

## 2010-11-23 LAB — PREPARE FRESH FROZEN PLASMA: Unit division: 0

## 2010-11-23 LAB — HEPARIN LEVEL (UNFRACTIONATED): Heparin Unfractionated: 0.22 IU/mL — ABNORMAL LOW (ref 0.30–0.70)

## 2010-11-23 MED ORDER — WHITE PETROLATUM GEL
Status: AC
  Administered 2010-11-23: 13:00:00
  Filled 2010-11-23: qty 5

## 2010-11-23 MED ORDER — FENTANYL CITRATE 0.05 MG/ML IJ SOLN
25.0000 ug | INTRAMUSCULAR | Status: DC | PRN
  Administered 2010-11-23 – 2010-12-03 (×7): 25 ug via INTRAVENOUS
  Filled 2010-11-23 (×8): qty 2

## 2010-11-23 NOTE — Progress Notes (Signed)
[name] is a 55 y.o. male former smoker admitted on 11/19/2010 with acute onset of slurred speech and Lt sided weakness.  Intubated in ED for airway protection and pulmonary edema.  Had spontaneous resolution of neuro symptoms. PMHx DM, CAD, CHF, HTN, Gout, A fib  Line/tubes: ETT 11/7>>11/9, 11/10 (cerebellar hge) >> Ventric 11/10 (dr Sherlon Handing emergent) >  Abx: Rocephin 11/8>> Zithromax 11/8>>  Cx: Blood 11/8>> Sputum 11/8>>  Best practice: Pepcid Heparin gtt/coumadin for A fib >>  stopped 11/19   Consults: Neuro Cariology [name])  Tests/events: 11/7: CT head>>negative 11/7: CT chest>>b/l GGO more at bases, small b/l effusions, no PE, dependent ATX 11/8: MRI head w/o contrast>>Acute/subacute non-hemorrhagic infarct Rt superior cerebellar artery territory, remote Lt cerebellar infarct, multiple punctated areas of remote hemorrhage compatible with amyloid angiopathy 11/8: Echo>>severe LVH, EF 25 to 35%, mild AR, mild MR, PAS 11/10 - CT head am: marked hgic transformation of rt cerebaellar infarct -> supratentorial hydrocephalus -> ventric drain 11/1 - GCS 5  SUBJECTIVE: On hypertonic saline. Neuro and Neruosurg feel overall unchanged. GCS 5 but doing PSV, weak gag + and slugghish pupil response +. Currently on PSV and BP high and RN thinks he can perceive pain  OBJECTIVE:  Blood pressure 147/115, pulse 57, temperature 99.7 F (37.6 C), temperature source Oral, resp. rate 18, height 5\' 9"  (1.753 m), weight 98.7 kg (217 lb 9.5 oz), SpO2 100.00%.   Intake/Output Summary (Last 24 hours) at 11/23/10 1516 Last data filed at 11/23/10 1400  Gross per 24 hour  Intake   2153 ml  Output   2596 ml  Net   -443 ml   General - obese, no distress HEENT - Pupils reactive Cardiac - irregular Chest - CTA bilaterally Abd - soft, non-tender Ext - no edema but some bruises in left arm and forearm ? Related to high INR and bp cuff: stable since 11/10 Neuro - GCS 5. Gag +.  Doing PSV.   Lab Results  Component Value Date   CREATININE 1.20 11/22/2010   BUN 16 11/22/2010   NA 148* 11/23/2010   K 3.0* 11/22/2010   CL 99 11/22/2010   CO2 26 11/22/2010   CBC    Component Value Date/Time   WBC 11.0* 11/22/2010 0630   RBC 4.91 11/22/2010 0630   HGB 15.0 11/22/2010 0630   HCT 43.1 11/22/2010 0630   PLT 270 11/22/2010 0630   MCV 87.8 11/22/2010 0630   MCH 30.5 11/22/2010 0630   MCHC 34.8 11/22/2010 0630   RDW 15.0 11/22/2010 0630   LYMPHSABS 5.0* 11/19/2010 1715   MONOABS 0.7 11/19/2010 1715   EOSABS 0.3 11/19/2010 1715   BASOSABS 0.0 11/19/2010 1715   Lab Results  Component Value Date   TSH 0.895 11/20/2010   FreeT4>>1.82 11/20/2010   ASSESSMENT/PLAN:  #Cerebellar Hemorrhage/COma : GCS 5. On hypertonic saline. Prognosis remains very poor PLAN  - per neurology and neurosurgery     #Acute resp failure: though doing SBT he does not qualify for Extubation PLAN - full vent support  #A. Fib -hepario stopped following cerebellar hemorrhage 11/21/10 PLAN monitor  #BP  - off cardene and bP MAP 135 currently. But also on PSV. Rn thinks pain response + PLAN - rest back on full vent support - prn fentanyl for pain - if bp still high go back on cardene gtt per prior order if : SBP >180 mmHg or MAP >130 mmHg for goal , target MAP of 110 mmHg or target blood pressure  of 160/90 mmHg   #Hx of CAD -monitor  #Gout -will need to resume allopurinol, colchicine when able to take oral medication  #Rt lower ASD with ?PNA -d4/x rocephin, zithromax -await blood cx, sputum culture  # ? Thyroiditis -CT images d/w radiology with concerns for thyroid inflammation -f/u mild elevation in FT4, and low normal TSH>>will monitor for now  Disposition -keep in ICU - no family at bedside currently (RN stated that they seemed upset that IV heparin caused deterioration)  Critical care time 30 minutes.  Arshan Jabs Pager:  [phone] 11/23/2010, 3:16 PM

## 2010-11-23 NOTE — Progress Notes (Signed)
Subjective: Comatose, intubated  Objective: Vital signs in last 24 hours: Temp:  [99.3 F (37.4 C)-100.5 F (38.1 C)] 99.8 F (37.7 C) (11/10 2330) Pulse Rate:  [38-130] 119  (11/11 0700) Resp:  [16-25] 22  (11/11 0700) BP: (106-183)/(73-127) 142/114 mmHg (11/11 0700) SpO2:  [97 %-100 %] 99 % (11/11 0700) FiO2 (%):  [29.7 %-100 %] 30.2 % (11/11 0700) Weight:  [98.7 kg (217 lb 9.5 oz)] 217 lb 9.5 oz (98.7 kg) (11/11 0500)  Intake/Output from previous day: 11/10 0701 - 11/11 0700 In: 2562 [I.V.:1735; Blood:327; NG/GT:100; IV Piggyback:400] Out: 2799 [Urine:2550; Drains:249] Intake/Output this shift:     physical exam- patient comatose intubated.  No eye opening, decorticate posturing, pupils 3.5 mm sluggish.  Lab Results:  Basename 11/22/10 0630 11/21/10 0330  WBC 11.0* 8.4  HGB 15.0 12.5*  HCT 43.1 37.3*  PLT 270 248   BMET  Basename 11/23/10 0600 11/23/10 0115 11/22/10 0630 11/21/10 1703  NA 146* 142 -- --  K -- -- 3.0* 3.8  CL -- -- 99 98  CO2 -- -- 26 25  GLUCOSE -- -- 152* 160*  BUN -- -- 16 17  CREATININE -- -- 1.20 1.34  CALCIUM -- -- 10.3 10.6*    Studies/Results: Ct Head Wo Contrast  11/21/2010  *RADIOLOGY REPORT*  Clinical Data:   Altered mental status  CT HEAD WITHOUT CONTRAST  Technique:  Contiguous axial images were obtained from the base of the skull through the vertex without contrast.  Comparison: Brain MRI 11/20/2010 and 07/2010  Findings: The  large infarction within the left cerebellum is now evident  on CT. The infarction involves the entirety of the right cerebral hemisphere and extends to the vermis.  There is increased effacement of the fourth ventricle compared to MRI one day prior. There is increase in the ventricular volume with the temporal horns more conspicuous than on prior.  There is no evidence of intracranial hemorrhage.  No midline shift.  IMPRESSION:  1. Large right cerebellar infarction. 2.  Edema from the infarcted cerebellum  compresses the fourth ventricle with mild obstructive hydrocephalus increased from 1 day prior.  Findings conveyed to Dr. Maple Hudson tube on 11/21/2010 at 2023 hours  Original Report Authenticated By: Genevive Bi, M.D.   Dg Chest Portable 1 View  11/22/2010  *RADIOLOGY REPORT*  Clinical Data: Central line placement.  PORTABLE CHEST - 1 VIEW  Comparison: Chest radiograph performed earlier today at 01:30 a.m.  Findings: The patient's endotracheal tube is seen ending 4-5 cm above the carina. The patient's enteric tube is seen extending below the diaphragm.  A right subclavian line is seen ending about the distal SVC.  Lung expansion is relatively stable.  Previously noted right basilar opacity has resolved.  Underlying vascular congestion is noted, similar in appearance to the prior study. Obscuration of the left lung base likely reflects atelectasis.  No definite pleural effusion or pneumothorax is seen.  The cardiomediastinal silhouette remains mildly enlarged.  No acute osseous abnormalities are identified.  IMPRESSION:  1.  Right subclavian line noted ending about the distal SVC. 2.  Persistent vascular congestion and mild cardiomegaly, without significant pulmonary edema. 3.  Obscuration of the left hemidiaphragm likely reflects atelectasis.  Original Report Authenticated By: Tonia Ghent, M.D.   Dg Chest Portable 1 View  11/22/2010  *RADIOLOGY REPORT*  Clinical Data: Endotracheal tube placement.  PORTABLE CHEST - 1 VIEW  Comparison: Chest radiograph performed 11/21/2010  Findings: The patient's endotracheal tube is seen ending 2-3 cm  above the carina.  The lungs are well-aerated.  Obscuration of the right hemidiaphragm may reflect residual edema, though pneumonia cannot be entirely excluded.  Mild vascular congestion is noted.  There is no evidence of pleural effusion or pneumothorax.  The cardiomediastinal silhouette is enlarged.  No acute osseous abnormalities are seen.  IMPRESSION:  1.  Endotracheal  tube seen ending 2-3 cm above the carina. 2.  Obscuration of the right hemidiaphragm may reflect residual edema, though pneumonia cannot be entirely excluded. 3.  Mild vascular congestion and cardiomegaly noted.  Original Report Authenticated By: Tonia Ghent, M.D.   Dg Abd Portable 1v  11/22/2010  *RADIOLOGY REPORT*  Clinical Data: Panda placement  ABDOMEN - 1 VIEW  Comparison: None  Findings: There is no evidence of bowel obstruction.  Feeding tube with tip in the gastric body with the tip pointing leftward.  IMPRESSION: Feeding tube with tip in the gastric body.  Original Report Authenticated By: Genevive Bi, M.D.   Ct Portable Head W/o Cm  11/22/2010  *RADIOLOGY REPORT*  Clinical Data: Follow up large right cerebellar infarct.  CT HEAD WITHOUT CONTRAST  Technique:  Contiguous axial images were obtained from the base of the skull through the vertex without contrast.  Comparison: CT of the head performed 11/21/2010, and MRI of the brain performed 11/20/2010  Findings:   On comparison with the prior MRI, CTA of the head performed 11/19/2010, and most recent head CT, there is increasing attenuation of central density at the right cerebellar hemisphere. This is highly suspicious for hemorrhagic transformation of the patient's right cerebellar infarct, especially given that the areas of increased attenuation appear to have mildly increased in size since the prior study.  Underlying right cerebellar vasogenic edema is again noted, with worsening mass effect on the right pons. There is effacement of the cisterns about the pons, raising concern for transtentorial herniation.  The fourth ventricle appears completely effaced, with mildly worsened dilatation of the third and lateral ventricles, compatible with hydrocephalus.  Gray-white differentiation within the cerebral hemispheres appears grossly preserved.  There is no evidence of fracture; visualized osseous structures are unremarkable in appearance.  The  orbits are within normal limits. The paranasal sinuses and mastoid air cells are well-aerated.  No significant soft tissue abnormalities are seen.  IMPRESSION:  1.  Suspect marked hemorrhagic transformation of right cerebellar infarct; the amount of intraparenchymal hemorrhage has mildly increased from the prior study. 2.  Mildly worsened right cerebellar vasogenic edema, with worsening mass effect on the right pons and effacement of the cisterns, raising concern for transtentorial herniation. 3.  Effacement of the fourth ventricle, with mildly worsened supratentorial hydrocephalus.  Findings were discussed with Dr. Levert Feinstein on [facility][id number] at 06:06 a.m. on 11/22/2010.  Original Report Authenticated By: Tonia Ghent, M.D.    Assessment/Plan: Patient is not improved after ventriculostomy. Patient with very poor prognosis.   Will follow no other neurosurgical intervention indicated.  LOS: 4 days     Clydene Fake, MD 11/23/2010, 7:12 AM

## 2010-11-23 NOTE — Progress Notes (Signed)
Subjective:  patient remains comatose and intubated and unresponsive. Had central line in place yesterday and has been started on 3% saline. Last sodium this morning was 146. He also had ventriculostomy placed by Dr. Phoebe Perch and it is draining well. I had a 20 minute conversation with the patient's brother and family about his prognosis plan for treatment and also cautions.  Objective: Vital signs in last 24 hours: Temp:  [99.3 F (37.4 C)-102.2 F (39 C)] 102.2 F (39 C) (11/11 0800) Pulse Rate:  [38-131] 62  (11/11 0900) Resp:  [14-24] 14  (11/11 0900) BP: (106-183)/(73-127) 155/109 mmHg (11/11 0900) SpO2:  [97 %-100 %] 98 % (11/11 0900) FiO2 (%):  [29.7 %-100 %] 30.1 % (11/11 0900) Weight:  [98.7 kg (217 lb 9.5 oz)] 217 lb 9.5 oz (98.7 kg) (11/11 0800) Weight change: 1.6 kg (3 lb 8.4 oz)    Intake/Output from previous day: 11/10 0701 - 11/11 0700 In: 2562 [I.V.:1735; Blood:327; NG/GT:100; IV Piggyback:400] Out: 2799 [Urine:2550; Drains:249] Intake/Output this shift: Total I/O In: 240 [I.V.:210; NG/GT:30] Out: 319 [Urine:295; Drains:24]   Neurological exam : patient is intubated he is not on any sedation. His is comatose and unresponsive. Pupils are 3 mm sluggishly reactive. Doll's eye movements are present. He has slight left gaze deviation. Corneal refelexes are sluggish but present. He has a weak cough and gag reflex. There are no spontaneous extremity movements noted. He has minimal ewthdrawal of both lower extremity to pain. There is no withdrawal in the upper extremities. There is minimal withdrawal in both lower extremities to pain. Both plantars are upgoing.   Lab Results:  Basename 11/22/10 0630 11/21/10 0330  WBC 11.0* 8.4  HGB 15.0 12.5*  HCT 43.1 37.3*  PLT 270 248   BMET  Basename 11/23/10 0600 11/23/10 0115 11/22/10 0630 11/21/10 1703  NA 146* 142 -- --  K -- -- 3.0* 3.8  CL -- -- 99 98  CO2 -- -- 26 25  GLUCOSE -- -- 152* 160*  BUN -- -- 16 17    CREATININE -- -- 1.20 1.34  CALCIUM -- -- 10.3 10.6*    Studies/Results: Ct Head Wo Contrast  11/21/2010  *RADIOLOGY REPORT*  Clinical Data:   Altered mental status  CT HEAD WITHOUT CONTRAST  Technique:  Contiguous axial images were obtained from the base of the skull through the vertex without contrast.  Comparison: Brain MRI 11/20/2010 and 07/2010  Findings: The  large infarction within the left cerebellum is now evident  on CT. The infarction involves the entirety of the right cerebral hemisphere and extends to the vermis.  There is increased effacement of the fourth ventricle compared to MRI one day prior. There is increase in the ventricular volume with the temporal horns more conspicuous than on prior.  There is no evidence of intracranial hemorrhage.  No midline shift.  IMPRESSION:  1. Large right cerebellar infarction. 2.  Edema from the infarcted cerebellum compresses the fourth ventricle with mild obstructive hydrocephalus increased from 1 day prior.  Findings conveyed to Dr. Maple Hudson tube on 11/21/2010 at 2023 hours  Original Report Authenticated By: Genevive Bi, M.D.   Dg Chest Portable 1 View  11/22/2010  *RADIOLOGY REPORT*  Clinical Data: Central line placement.  PORTABLE CHEST - 1 VIEW  Comparison: Chest radiograph performed earlier today at 01:30 a.m.  Findings: The patient's endotracheal tube is seen ending 4-5 cm above the carina. The patient's enteric tube is seen extending below the diaphragm.  A right subclavian line  is seen ending about the distal SVC.  Lung expansion is relatively stable.  Previously noted right basilar opacity has resolved.  Underlying vascular congestion is noted, similar in appearance to the prior study. Obscuration of the left lung base likely reflects atelectasis.  No definite pleural effusion or pneumothorax is seen.  The cardiomediastinal silhouette remains mildly enlarged.  No acute osseous abnormalities are identified.  IMPRESSION:  1.  Right subclavian  line noted ending about the distal SVC. 2.  Persistent vascular congestion and mild cardiomegaly, without significant pulmonary edema. 3.  Obscuration of the left hemidiaphragm likely reflects atelectasis.  Original Report Authenticated By: Tonia Ghent, M.D.   Dg Chest Portable 1 View  11/22/2010  *RADIOLOGY REPORT*  Clinical Data: Endotracheal tube placement.  PORTABLE CHEST - 1 VIEW  Comparison: Chest radiograph performed 11/21/2010  Findings: The patient's endotracheal tube is seen ending 2-3 cm above the carina.  The lungs are well-aerated.  Obscuration of the right hemidiaphragm may reflect residual edema, though pneumonia cannot be entirely excluded.  Mild vascular congestion is noted.  There is no evidence of pleural effusion or pneumothorax.  The cardiomediastinal silhouette is enlarged.  No acute osseous abnormalities are seen.  IMPRESSION:  1.  Endotracheal tube seen ending 2-3 cm above the carina. 2.  Obscuration of the right hemidiaphragm may reflect residual edema, though pneumonia cannot be entirely excluded. 3.  Mild vascular congestion and cardiomegaly noted.  Original Report Authenticated By: Tonia Ghent, M.D.   Dg Abd Portable 1v  11/22/2010  *RADIOLOGY REPORT*  Clinical Data: Panda placement  ABDOMEN - 1 VIEW  Comparison: None  Findings: There is no evidence of bowel obstruction.  Feeding tube with tip in the gastric body with the tip pointing leftward.  IMPRESSION: Feeding tube with tip in the gastric body.  Original Report Authenticated By: Genevive Bi, M.D.   Ct Portable Head W/o Cm  11/22/2010  *RADIOLOGY REPORT*  Clinical Data: Follow up large right cerebellar infarct.  CT HEAD WITHOUT CONTRAST  Technique:  Contiguous axial images were obtained from the base of the skull through the vertex without contrast.  Comparison: CT of the head performed 11/21/2010, and MRI of the brain performed 11/20/2010  Findings:   On comparison with the prior MRI, CTA of the head performed  11/19/2010, and most recent head CT, there is increasing attenuation of central density at the right cerebellar hemisphere. This is highly suspicious for hemorrhagic transformation of the patient's right cerebellar infarct, especially given that the areas of increased attenuation appear to have mildly increased in size since the prior study.  Underlying right cerebellar vasogenic edema is again noted, with worsening mass effect on the right pons. There is effacement of the cisterns about the pons, raising concern for transtentorial herniation.  The fourth ventricle appears completely effaced, with mildly worsened dilatation of the third and lateral ventricles, compatible with hydrocephalus.  Gray-white differentiation within the cerebral hemispheres appears grossly preserved.  There is no evidence of fracture; visualized osseous structures are unremarkable in appearance.  The orbits are within normal limits. The paranasal sinuses and mastoid air cells are well-aerated.  No significant soft tissue abnormalities are seen.  IMPRESSION:  1.  Suspect marked hemorrhagic transformation of right cerebellar infarct; the amount of intraparenchymal hemorrhage has mildly increased from the prior study. 2.  Mildly worsened right cerebellar vasogenic edema, with worsening mass effect on the right pons and effacement of the cisterns, raising concern for transtentorial herniation. 3.  Effacement of the fourth ventricle,  with mildly worsened supratentorial hydrocephalus.  Findings were discussed with Dr. Levert Feinstein on [facility]3100 at 06:06 a.m. on 11/22/2010.  Original Report Authenticated By: Tonia Ghent, M.D.      Medications: I have reviewed the patient's current medications.  Assessment/Plan:  [name] is an 55 y.o. year old male with left-sided weakness and dizziness likely a right hemispheric TIA versus a small right brain infarct of cardioembolic etiology secondary to atrial fibrillation with suboptimal  anticoagulation on Coumadin with INR of 1.7 on admission. CT angiogram shows severe basilar atherosclerotic posterior circulation disease . Pulmonary edema and respiratory failure requiring intubation  . He was extubated 11/21/10 and did well for several hours but 11/21/10 evening developed sudden onset of neurochange and was found to be obtunded. Repeat CT scan shows hemorrhagic transformation in the large right cerebellar infarct with brainstem compression, hydrocephalus and cerebral edema.His exam is quite poor at present.He did not undergo emergent neurosurgical intervention secondary to being autoanticoagulated with INR of 2.2 which has since been reversed.Repeat CT scan from 11/22/10 show slightly increased Hemorrhagic infarct with brainstem compression and hydrocephalus Plan continue hypertonic saline with sodium goal 150-155. Checks serum sodium every 6 hourly. Continue ventriculostomy CSF drainage. Keep normothermic, euglycemic and euvolemic. Repeat CT scan of the head without contrast in the morning. Plan family meeting later this morning or tomorrow morning as per family's discretion. Prognosis remains guarded.The patient is medically ill and significant risk for neurological worsening, hydrocephalus and death. His care requires frequent neurological monitoring, hemodynamic monitoring, ventilatory support and medical decision making of high complexity. I spent 30 minutes of neurological critical care time in involvement of this patient's care. Will discuss plan of care with CCM MD , Neurosurgery and family   LOS: 4 days   SETHI,PRAMODKUMAR P 11/23/2010, 10:07 AM

## 2010-11-24 ENCOUNTER — Inpatient Hospital Stay (HOSPITAL_COMMUNITY): Payer: 59

## 2010-11-24 DIAGNOSIS — R4020 Unspecified coma: Secondary | ICD-10-CM

## 2010-11-24 DIAGNOSIS — J9600 Acute respiratory failure, unspecified whether with hypoxia or hypercapnia: Secondary | ICD-10-CM

## 2010-11-24 DIAGNOSIS — I6340 Cerebral infarction due to embolism of unspecified cerebral artery: Secondary | ICD-10-CM

## 2010-11-24 DIAGNOSIS — I629 Nontraumatic intracranial hemorrhage, unspecified: Secondary | ICD-10-CM

## 2010-11-24 LAB — BLOOD GAS, ARTERIAL
Drawn by: 320991
MECHVT: 500 mL
PEEP: 5 cmH2O
Patient temperature: 98.6
pCO2 arterial: 37.9 mmHg (ref 35.0–45.0)
pH, Arterial: 7.438 (ref 7.350–7.450)

## 2010-11-24 LAB — SODIUM
Sodium: 155 mEq/L — ABNORMAL HIGH (ref 135–145)
Sodium: 155 mEq/L — ABNORMAL HIGH (ref 135–145)

## 2010-11-24 LAB — GLUCOSE, CAPILLARY: Glucose-Capillary: 163 mg/dL — ABNORMAL HIGH (ref 70–99)

## 2010-11-24 LAB — BASIC METABOLIC PANEL
BUN: 23 mg/dL (ref 6–23)
Calcium: 10.1 mg/dL (ref 8.4–10.5)
Creatinine, Ser: 1.33 mg/dL (ref 0.50–1.35)
GFR calc Af Amer: 68 mL/min — ABNORMAL LOW (ref >90)
GFR calc non Af Amer: 58 mL/min — ABNORMAL LOW (ref >90)

## 2010-11-24 MED ORDER — VANCOMYCIN HCL 1000 MG IV SOLR
1250.0000 mg | Freq: Two times a day (BID) | INTRAVENOUS | Status: DC
  Administered 2010-11-24 – 2010-11-28 (×9): 1250 mg via INTRAVENOUS
  Filled 2010-11-24 (×9): qty 1250

## 2010-11-24 MED ORDER — POTASSIUM CHLORIDE 20 MEQ/15ML (10%) PO LIQD
40.0000 meq | Freq: Two times a day (BID) | ORAL | Status: AC
  Administered 2010-11-24 (×2): 40 meq
  Filled 2010-11-24 (×2): qty 30

## 2010-11-24 MED ORDER — JEVITY 1.2 CAL PO LIQD
1000.0000 mL | ORAL | Status: DC
  Administered 2010-11-24: 1000 mL
  Filled 2010-11-24: qty 1000

## 2010-11-24 MED ORDER — PIPERACILLIN-TAZOBACTAM 3.375 G IVPB
3.3750 g | Freq: Three times a day (TID) | INTRAVENOUS | Status: DC
  Administered 2010-11-24 – 2010-12-01 (×21): 3.375 g via INTRAVENOUS
  Filled 2010-11-24 (×22): qty 50

## 2010-11-24 MED ORDER — PIVOT 1.5 CAL PO LIQD
1000.0000 mL | ORAL | Status: DC
  Administered 2010-11-24 – 2010-12-01 (×6): 1000 mL
  Filled 2010-11-24 (×9): qty 1000

## 2010-11-24 MED ORDER — POTASSIUM CHLORIDE 20 MEQ/15ML (10%) PO LIQD
ORAL | Status: AC
  Administered 2010-11-24: 40 meq
  Filled 2010-11-24: qty 30

## 2010-11-24 MED ORDER — PRO-STAT SUGAR FREE PO LIQD
30.0000 mL | Freq: Every day | ORAL | Status: DC
  Administered 2010-11-24 – 2010-11-27 (×15): 30 mL
  Filled 2010-11-24 (×19): qty 30

## 2010-11-24 NOTE — Progress Notes (Signed)
Patient is being discharged from OT services secondary to:  Medical decline- patient  Intubated and sedated. Acute OT to sign off at this time. Will need to re-order to resume therapy services.  Patient unable to participate in discharge planning  11/24/2010 [place] Chard   OTR/L Pager: [phone]

## 2010-11-24 NOTE — Progress Notes (Signed)
Subjective:  Events of weekend noted Patient remains intubated comatose    Objective:  Vital Signs in the last 24 hours: Temp:  [98.4 F (36.9 C)-99.7 F (37.6 C)] 98.4 F (36.9 C) (11/12 1115) Pulse Rate:  [38-140] 74  (11/12 1115) Resp:  [12-29] 16  (11/12 1115) BP: (85-180)/(64-135) 97/74 mmHg (11/12 1115) SpO2:  [97 %-100 %] 100 % (11/12 1115) FiO2 (%):  [29.7 %-30.3 %] 30 % (11/12 1239) Weight:  [97.1 kg (214 lb 1.1 oz)] 214 lb 1.1 oz (97.1 kg) (11/12 0500)  Intake/Output from previous day: 11/11 0701 - 11/12 0700 In: 2940.5 [I.V.:2390.5; NG/GT:400; IV Piggyback:150] Out: 3154 [Urine:2895; Drains:259] Intake/Output from this shift: Total I/O In: 185 [I.V.:125; NG/GT:60] Out: 331 [Urine:325; Drains:6]  Physical Exam: Patient remains intubated comatose and responsive.  Hemodynamically stable A. fib with moderate ventricular response Neck supple no JVD Lungs decreased breath sounds with occasional rails Cardiovascular exam irregularly irregular rhythm S1-S2 is soft there is soft systolic and diastolic murmur. Abdomen is soft bowel sounds were present Extremities there is no clubbing cyanosis or edema.  Lab Results:  Basename 11/22/10 0630  WBC 11.0*  HGB 15.0  PLT 270    Basename 11/24/10 0500 11/24/10 11/22/10 0630 11/21/10 1703  NA 155* 152* -- --  K -- -- 3.0* 3.8  CL -- -- 99 98  CO2 -- -- 26 25  GLUCOSE -- -- 152* 160*  BUN -- -- 16 17  CREATININE -- -- 1.20 1.34   No results found for this basename: TROPONINI:2,CK,MB:2 in the last 72 hours Hepatic Function Panel No results found for this basename: PROT,ALBUMIN,AST,ALT,ALKPHOS,BILITOT,BILIDIR,IBILI in the last 72 hours No results found for this basename: CHOL in the last 72 hours No results found for this basename: PROTIME in the last 72 hours  Imaging:    Assessment/Plan:  Impression right hemorrhagic cerebellar infarct Ischemic cardiomyopathy Chronic atrial fibrillation Status post  cardiogenic stroke Mild decompensated systolic heart failure improved Status post hypertensive emergency Probable aspiration pneumonia Plan Continue present supportive care as per CCM/neurosurgery/neurology Prognosis poor Patient at high risk of having recurrent embolic stroke Family deciding regarding level of care I will sign off please call if needed   LOS: 5 days    Shafer Swamy N 11/24/2010, 12:48 PM

## 2010-11-24 NOTE — Progress Notes (Signed)
Nutrition Follow-up/ Consult for TF recs  Meds:     . amLODipine  5 mg Oral Daily  . antiseptic oral rinse  15 mL Mouth Rinse QID  . carvedilol  25 mg Oral BID WC  . chlorhexidine  15 mL Mouth Rinse BID  . cloNIDine  0.1 mg Oral BID  . famotidine (PEPCID) IV  20 mg Intravenous Q12H  . furosemide  40 mg Intravenous Daily  . insulin aspart  0-4 Units Subcutaneous Q4H  . isosorbide-hydrALAZINE  1 tablet Oral TID  . potassium chloride  40 mEq Per Tube BID  . ramipril  10 mg Oral BID  . simvastatin  20 mg Oral q1800  . spironolactone  50 mg Oral Daily  . white petrolatum      . DISCONTD: azithromycin  500 mg Intravenous Q24H  . DISCONTD: cefTRIAXone (ROCEPHIN) IV  1 g Intravenous Q24H    Labs: 11/22/2010      Sodium 140 mEq/L      Potassium 3.0 mEq/L L     Chloride 99 mEq/L      CO2 26 mEq/L      Glucose, Bld 152 mg/dL H     BUN 16 mg/dL      Creatinine, Ser 1.61 mg/dL      Calcium 09.6 mg/dL      GFR calc non Af Amer 66 mL/min L     GFR calc Af Amer 76 mL/min L   Wt. Status:  214lbs wt stable  Diet: Pt NPO at this time. RD assessed pt 11/10 and recommend jevity 1.2 however given history of CHF and pt currently on lasix will switch to more concentrated formula. Pt currently on Jevity 1.2 @30ml /hr per nutr support protocol.  Estimated nutr needs Kcals: 1750-1925 Prot: greater than equal to 145gm/day Fluid: 85ml/kcal  Nutr dx:  Inadequate oral intake- ongoing  Goal:  Meet greater than or equal to 60% energy/100% prot needs per ASPEN guidelines for critically ill obese pts.  Intervention: 1. Change TF to pivot 1.5 goal rate of 65ml/hr to provide 1260kcals/79gmprot/629ml H2O.  2. Add prostat 64 30ml five times daily to provide additional 360kcals/75gmprot to better meet prot needs Total kcals 1620/154gm Prot to meet approx. 85% energy and 100%prot needs. 3. H20 flushes q6h to provide total free h20 .  Monitor: TF tolerance/adequacy, wt trends, labs   Pager #:  [phone]

## 2010-11-24 NOTE — Progress Notes (Signed)
[name] is a 55 y.o. male former smoker admitted on 11/19/2010 with acute onset of slurred speech and Lt sided weakness.  Intubated in ED for airway protection and pulmonary edema.  Had spontaneous resolution of neuro symptoms. PMHx DM, CAD, CHF, HTN, Gout, A fib  Line/tubes: ETT 11/7>>11/9, 11/10 (cerebellar hge) >> Ventric 11/10 (dr Sherlon Handing emergent) > R [place] [facility] 11/7>>>  Abx: Rocephin 11/8>>11/12 Zithromax 11/8>>11/12 Zosyn 11/12>>> Vancomycin 11/12>>>  Cx: Blood 11/8>>NTD Sputum 11/8>>Moderate Staph aureus. Blood 11/12>>> Urine 11/12>>> Sputum 11/12>>>  Best practice: Pepcid Heparin gtt/coumadin for A fib >>  stopped 11/19   Consults: Neuro Cardiology [name])  Tests/events: 11/7: CT head>>negative 11/7: CT chest>>b/l GGO more at bases, small b/l effusions, no PE, dependent ATX 11/8: MRI head w/o contrast>>Acute/subacute non-hemorrhagic infarct Rt superior cerebellar artery territory, remote Lt cerebellar infarct, multiple punctated areas of remote hemorrhage compatible with amyloid angiopathy 11/8: Echo>>severe LVH, EF 25 to 35%, mild AR, mild MR, PAS 11/10 - CT head am: marked hgic transformation of rt cerebaellar infarct -> supratentorial hydrocephalus -> ventric drain 11/10 - GCS 5 then reintubated.  SUBJECTIVE: On hypertonic saline. Neuro and Neruosurg feel overall unchanged. GCS 5 but doing PSV, weak gag + and slugghish pupil response +. Currently on PSV and BP high and RN thinks he can perceive pain.  OBJECTIVE:  Blood pressure 138/100, pulse 58, temperature 98.8 F (37.1 C), temperature source Oral, resp. rate 23, height 5\' 9"  (1.753 m), weight 97.1 kg (214 lb 1.1 oz), SpO2 100.00%.   Intake/Output Summary (Last 24 hours) at 11/24/10 1101 Last data filed at 11/24/10 0830  Gross per 24 hour  Intake 2582.5 ml  Output   2886 ml  Net -303.5 ml   General - obese, no distress HEENT - Pupils reactive Cardiac - irregularly irregular Chest -  Coarse BS bilaterally Abd - soft, non-tender, ND and +BS Ext - no edema but some bruises in left arm and forearm ? Related to high INR and bp cuff: stable since 11/10 Neuro - GCS 5. Gag +. Doing PSV.   Lab Results  Component Value Date   CREATININE 1.20 11/22/2010   BUN 16 11/22/2010   NA 155* 11/24/2010   K 3.0* 11/22/2010   CL 99 11/22/2010   CO2 26 11/22/2010   CBC    Component Value Date/Time   WBC 11.0* 11/22/2010 0630   RBC 4.91 11/22/2010 0630   HGB 15.0 11/22/2010 0630   HCT 43.1 11/22/2010 0630   PLT 270 11/22/2010 0630   MCV 87.8 11/22/2010 0630   MCH 30.5 11/22/2010 0630   MCHC 34.8 11/22/2010 0630   RDW 15.0 11/22/2010 0630   LYMPHSABS 5.0* 11/19/2010 1715   MONOABS 0.7 11/19/2010 1715   EOSABS 0.3 11/19/2010 1715   BASOSABS 0.0 11/19/2010 1715   Lab Results  Component Value Date   TSH 0.895 11/20/2010   FreeT4>>1.82 11/20/2010   ASSESSMENT/PLAN:  #Cerebellar Hemorrhage/COma : GCS 5. On hypertonic saline. Prognosis remains very poor PLAN  - per neurology and neurosurgery and family meeting per dr. Pearlean Brownie.    #Acute resp failure: though doing SBT he does not qualify for Extubation due to inability to protect airway. PLAN - Place on PS as tolerated. - Diurese.  #A. Fib -hepario stopped following cerebellar hemorrhage 11/21/10 PLAN monitor  #BP  - off cardene and bP MAP 135 currently. But also on PSV. Rn thinks pain response + PLAN - rest back on full vent support - prn fentanyl for pain -  if bp still high go back on cardene gtt per prior order if : SBP >180 mmHg or MAP >130 mmHg for goal , target MAP of 110 mmHg or target blood pressure of 160/90 mmHg   #Hx of CAD -monitor  #Gout -will need to resume allopurinol, colchicine when able to take oral medication  #Rt lower ASD with ?PNA -D/C rocephin, zithromax -Pan culture -Broad spectrum abx.  # ? Thyroiditis -CT images d/w radiology with concerns for thyroid inflammation -f/u mild elevation  in FT4, and low normal TSH>>will monitor for now  Disposition -keep in ICU - no family at bedside currently (RN stated that they seemed upset that IV heparin caused deterioration)  Hypokalemia Replace and recheck.  Critical care time 35 minutes.  Koren Bound Pager:  [phone] 11/24/2010, 11:01 AM

## 2010-11-24 NOTE — Progress Notes (Signed)
Chaplain Note:  Chaplain met with family of pt and offered spiritual comfort and support.  Pt was intubated and did not respond to chaplain's presence during this visit.  Family appreciated chaplain support and will gather for prayer later in the day.  Family will ask nurse to notify chaplain if and when further spiritual care is required.  Chaplain communicated this information to pt nurse.   Verdie Shire, chaplain resident [phone]

## 2010-11-24 NOTE — Progress Notes (Signed)
CSW provided information on [facility] to pt family members, explaining limited facility options. Pt family members are considering this option and state they are advised by MD to make a decision on withdrawal of care versus [facility] in 72 hours. Pt family may need MD letter to facilitate management of pt financial affairs. CSW will continue to follow and assist as needed.

## 2010-11-24 NOTE — Progress Notes (Signed)
ANTIBIOTIC CONSULT NOTE - INITIAL  Pharmacy Consult for Vancomycin/Zosyn Indication: HCAP  No Known Allergies  Patient Measurements: Height: 5\' 9"  (175.3 cm) Weight: 214 lb 1.1 oz (97.1 kg) IBW/kg (Calculated) : 70.7   Vital Signs: Temp: 98.4 F (36.9 C) (11/12 1115) Temp src: Oral (11/12 1115) BP: 107/86 mmHg (11/12 1145) Pulse Rate: 41  (11/12 1145) Intake/Output from previous day: 11/11 0701 - 11/12 0700 In: 2940.5 [I.V.:2390.5; NG/GT:400; IV Piggyback:150] Out: 3154 [Urine:2895; Drains:259] Intake/Output from this shift: Total I/O In: 610 [I.V.:300; NG/GT:60; IV Piggyback:250] Out: 331 [Urine:325; Drains:6]  Labs:  [facility] 11/24/10 1200 11/22/10 0630 11/21/10 1703  WBC -- 11.0* --  HGB -- 15.0 --  PLT -- 270 --  LABCREA -- -- --  CREATININE 1.33 1.20 1.34   Estimated Creatinine Clearance: 71.3 ml/min (by C-G formula based on Cr of 1.33). No results found for this basename: VANCOTROUGH:2,VANCOPEAK:2,VANCORANDOM:2,GENTTROUGH:2,GENTPEAK:2,GENTRANDOM:2,TOBRATROUGH:2,TOBRAPEAK:2,TOBRARND:2,AMIKACINPEAK:2,AMIKACINTROU:2,AMIKACIN:2, in the last 72 hours   Microbiology: Recent Results (from the past 720 hour(s))  MRSA PCR SCREENING     Status: Normal   Collection Time   11/19/10  9:04 PM      Component Value Range Status Comment   MRSA by PCR NEGATIVE  NEGATIVE  Final   CULTURE, RESPIRATORY     Status: Normal   Collection Time   11/20/10  9:01 AM      Component Value Range Status Comment   Specimen Description TRACHEAL ASPIRATE   Final    Special Requests NONE   Final    Gram Stain     Final    Value: RARE WBC PRESENT,BOTH PMN AND MONONUCLEAR     NO SQUAMOUS EPITHELIAL CELLS SEEN     FEW GRAM POSITIVE COCCI     IN PAIRS RARE GRAM POSITIVE RODS     RARE GRAM NEGATIVE COCCI   Culture     Final    Value: MODERATE STAPHYLOCOCCUS AUREUS     Note: RIFAMPIN AND GENTAMICIN SHOULD NOT BE USED AS SINGLE DRUGS FOR TREATMENT OF STAPH INFECTIONS.   Report Status 11/23/2010  FINAL   Final    Organism ID, Bacteria STAPHYLOCOCCUS AUREUS   Final   CULTURE, BLOOD (ROUTINE X 2)     Status: Normal (Preliminary result)   Collection Time   11/20/10 10:00 AM      Component Value Range Status Comment   Specimen Description BLOOD ARM LEFT   Final    Special Requests BOTTLES DRAWN AEROBIC AND ANAEROBIC 10CC EACH   Final    Setup Time [id number]   Final    Culture     Final    Value:        BLOOD CULTURE RECEIVED NO GROWTH TO DATE CULTURE WILL BE HELD FOR 5 DAYS BEFORE ISSUING A FINAL NEGATIVE REPORT   Report Status PENDING   Incomplete   CULTURE, BLOOD (ROUTINE X 2)     Status: Normal (Preliminary result)   Collection Time   11/20/10 10:20 AM      Component Value Range Status Comment   Specimen Description BLOOD HAND LEFT   Final    Special Requests BOTTLES DRAWN AEROBIC ONLY 5.0 CC   Final    Setup Time [id number]   Final    Culture     Final    Value:        BLOOD CULTURE RECEIVED NO GROWTH TO DATE CULTURE WILL BE HELD FOR 5 DAYS BEFORE ISSUING A FINAL NEGATIVE REPORT   Report Status PENDING   Incomplete  Medical History: Past Medical History  Diagnosis Date  . Diabetes mellitus   . Coronary artery disease   . Hypertension   . Gout   . CHF (congestive heart failure)   . Afib   . Arthritis     Medications:  Scheduled:    . amLODipine  5 mg Oral Daily  . antiseptic oral rinse  15 mL Mouth Rinse QID  . carvedilol  25 mg Oral BID WC  . chlorhexidine  15 mL Mouth Rinse BID  . cloNIDine  0.1 mg Oral BID  . famotidine (PEPCID) IV  20 mg Intravenous Q12H  . feeding supplement  30 mL Per Tube 5 X Daily  . furosemide  40 mg Intravenous Daily  . insulin aspart  0-4 Units Subcutaneous Q4H  . isosorbide-hydrALAZINE  1 tablet Oral TID  . potassium chloride  40 mEq Per Tube BID  . ramipril  10 mg Oral BID  . simvastatin  20 mg Oral q1800  . spironolactone  50 mg Oral Daily  . DISCONTD: azithromycin  500 mg Intravenous Q24H  . DISCONTD: cefTRIAXone  (ROCEPHIN) IV  1 g Intravenous Q24H   Assessment: 55 y/o male patient with worsening pna requiring broad spectrum antibiotics for HCAP. Tracheal aspirate cx reveals MSSA.  Goal of Therapy:  Vancomycin trough level 15-20 mcg/ml  Plan:  Measure antibiotic drug levels at steady state Vancomycin 1250mg  IV q12h. Zosyn 3.375g iv q8h. Monitor renal fxn.  Verlene Mayer, PharmD, BCPS Pager [phone]  11/24/2010,1:20 PM

## 2010-11-24 NOTE — Progress Notes (Signed)
Subjective: Comatose, intubated  Objective: Vital signs in last 24 hours: Temp:  [98.4 F (36.9 C)-99.7 F (37.6 C)] 98.4 F (36.9 C) (11/12 1115) Pulse Rate:  [38-140] 74  (11/12 1115) Resp:  [12-29] 16  (11/12 1115) BP: (85-180)/(64-135) 97/74 mmHg (11/12 1115) SpO2:  [97 %-100 %] 100 % (11/12 1115) FiO2 (%):  [29.7 %-30.3 %] 30 % (11/12 0954) Weight:  [97.1 kg (214 lb 1.1 oz)] 214 lb 1.1 oz (97.1 kg) (11/12 0500)  Intake/Output from previous day: 11/11 0701 - 11/12 0700 In: 2940.5 [I.V.:2390.5; NG/GT:400; IV Piggyback:150] Out: 3154 [Urine:2895; Drains:259] Intake/Output this shift: Total I/O In: 165 [I.V.:125; NG/GT:40] Out: 331 [Urine:325; Drains:6]  neuro-  ventric draining -   Pt intubated - no response to pain -   No sig changes  Lab Results:  Basename 11/22/10 0630  WBC 11.0*  HGB 15.0  HCT 43.1  PLT 270   BMET  Basename 11/24/10 0500 11/24/10 11/22/10 0630 11/21/10 1703  NA 155* 152* -- --  K -- -- 3.0* 3.8  CL -- -- 99 98  CO2 -- -- 26 25  GLUCOSE -- -- 152* 160*  BUN -- -- 16 17  CREATININE -- -- 1.20 1.34  CALCIUM -- -- 10.3 10.6*    Studies/Results: Ct Portable Head W/o Cm  11/24/2010  *RADIOLOGY REPORT*  Clinical Data: Follow-up hemorrhage.  Followup hydrocephalus.  CT HEAD WITHOUT CONTRAST  Technique:  Contiguous axial images were obtained from the base of the skull through the vertex without contrast.  Comparison: 11/22/2010  Findings: Right frontal ventricular drainage catheter has been placed with improved hydrocephalus. There appears to be a small hemorrhage surrounding the catheter in the right frontal lobe, without significant mass effect.  The tip of the catheter lies in the anterior third ventricle. Intraventricular hemorrhage layers in the right greater than left lateral ventricles.  Hemorrhagic right cerebellar infarct persists with significant right to left shift.  There is obliteration of the basilar cisterns. The fourth ventricle is  compressed and displaced.  The tonsils appear impacted in the foramen magnum.  Upward and downward transtentorial herniation is suspected.  There appears to be edema involving the pons, greater on the right, which could represent extension of infarction.  IMPRESSION: Improved hydrocephalus status post ventricular drainage. A small hemorrhage surrounding the catheter does not appear clinically significant.  Hemorrhagic right cerebellar infarct with significant mass effect on surrounding structures; upward and downward transdural herniation not excluded.  Original Report Authenticated By: Elsie Stain, M.D.    Assessment/Plan: No significant change - no other neurosurgical intervention indicated -  Very poor prognosis  LOS: 5 days     Mareta Chesnut R, MD 11/24/2010, 12:18 PM

## 2010-11-24 NOTE — Progress Notes (Signed)
Stroke Team Progress Note  SUBJECTIVE Family at bedside. Temp up last night. Na at goal. Tube feeds ordered but not started yet.I I had a long discussion with the patient's family members yesterday as well as this morning about his poor prognosis and necessarily need for long-term ventilatory support, tracheostomy and PEG tube placement.  OBJECTIVE Vital signs: Temp: 99.3 F (37.4 C) (11/11 2313) Temp src: Oral (11/11 2313) BP: 139/107 mmHg (11/12 0740) Pulse Rate: 131  (11/12 0740) Respiratory Rate: 16  Intake/Output from previous day: 11/11 0701 - 11/12 0700 In: 2940.5 [I.V.:2390.5; NG/GT:400; IV Piggyback:150] Out: 3154 [Urine:2895; Drains:259]   IV Fluid Intake:    . niCARDipine 50 mL/hr (11/24/10 0700)  . 3% sodium chloride 75 mL/hr at 11/24/10 0700   Diet: NPO  Activity: Bedrest  DVT Prophylaxis:  SCDs   Studies: Labs Reviewed  DIFFERENTIAL - Abnormal; Notable for the following:    Neutrophils Relative 34 (*)    Lymphocytes Relative 56 (*)    Lymphs Abs 5.0 (*)    All other components within normal limits  COMPREHENSIVE METABOLIC PANEL - Abnormal; Notable for the following:    Potassium 5.5 (*) HEMOLYSIS AT THIS LEVEL MAY AFFECT RESULT   CO2 15 (*)    Glucose, Bld 211 (*)    BUN 25 (*)    Creatinine, Ser 1.42 (*)    Albumin 3.4 (*)    GFR calc non Af Amer 54 (*)    GFR calc Af Amer 62 (*)    All other components within normal limits  PROTIME-INR - Abnormal; Notable for the following:    Prothrombin Time 21.0 (*)    INR 1.78 (*)    All other components within normal limits  POCT I-STAT, CHEM 8 - Abnormal; Notable for the following:    BUN 28 (*)    Creatinine, Ser 1.50 (*)    Glucose, Bld 221 (*)    All other components within normal limits  GLUCOSE, CAPILLARY - Abnormal; Notable for the following:    Glucose-Capillary 201 (*)    All other components within normal limits  CARDIAC PANEL(CRET KIN+CKTOT+MB+TROPI) - Abnormal; Notable for the following:    Relative Index 2.7 (*)    All other components within normal limits  CARDIAC PANEL(CRET KIN+CKTOT+MB+TROPI) - Abnormal; Notable for the following:    Relative Index 2.7 (*)    All other components within normal limits  BASIC METABOLIC PANEL - Abnormal; Notable for the following:    Sodium 134 (*)    Glucose, Bld 127 (*)    Creatinine, Ser 1.39 (*)    GFR calc non Af Amer 55 (*)    GFR calc Af Amer 64 (*)    All other components within normal limits  BLOOD GAS, ARTERIAL - Abnormal; Notable for the following:    pO2, Arterial 126.0 (*)    All other components within normal limits  HEPARIN LEVEL - Abnormal; Notable for the following:    Heparin Unfractionated <0.10 (*)    All other components within normal limits  CBC - Abnormal; Notable for the following:    RBC 4.05 (*)    Hemoglobin 11.8 (*)    HCT 35.5 (*)    All other components within normal limits  GLUCOSE, CAPILLARY - Abnormal; Notable for the following:    Glucose-Capillary 133 (*)    All other components within normal limits  GLUCOSE, CAPILLARY - Abnormal; Notable for the following:    Glucose-Capillary 128 (*)    All other  components within normal limits  HEPARIN LEVEL - Abnormal; Notable for the following:    Heparin Unfractionated <0.10 (*)    All other components within normal limits  PROTIME-INR - Abnormal; Notable for the following:    Prothrombin Time 24.9 (*)    INR 2.21 (*)    All other components within normal limits  GLUCOSE, CAPILLARY - Abnormal; Notable for the following:    Glucose-Capillary 124 (*)    All other components within normal limits  T4, FREE - Abnormal; Notable for the following:    Free T4 1.82 (*)    All other components within normal limits  HEMOGLOBIN A1C - Abnormal; Notable for the following:    Hemoglobin A1C 8.2 (*)    Mean Plasma Glucose 189 (*)    All other components within normal limits  LIPID PANEL - Abnormal; Notable for the following:    Cholesterol 203 (*)     Triglycerides 158 (*)    LDL Cholesterol 120 (*)    All other components within normal limits  GLUCOSE, CAPILLARY - Abnormal; Notable for the following:    Glucose-Capillary 219 (*)    All other components within normal limits  HEPARIN LEVEL - Abnormal; Notable for the following:    Heparin Unfractionated 0.71 (*)    All other components within normal limits  GLUCOSE, CAPILLARY - Abnormal; Notable for the following:    Glucose-Capillary 150 (*)    All other components within normal limits  CBC - Abnormal; Notable for the following:    Hemoglobin 12.5 (*)    HCT 37.3 (*)    All other components within normal limits  BASIC METABOLIC PANEL - Abnormal; Notable for the following:    Glucose, Bld 131 (*)    Creatinine, Ser 1.66 (*)    GFR calc non Af Amer 45 (*)    GFR calc Af Amer 52 (*)    All other components within normal limits  PRO B NATRIURETIC PEPTIDE - Abnormal; Notable for the following:    BNP, POC 666.0 (*)    All other components within normal limits  GLUCOSE, CAPILLARY - Abnormal; Notable for the following:    Glucose-Capillary 141 (*)    All other components within normal limits  GLUCOSE, CAPILLARY - Abnormal; Notable for the following:    Glucose-Capillary 184 (*)    All other components within normal limits  GLUCOSE, CAPILLARY - Abnormal; Notable for the following:    Glucose-Capillary 152 (*)    All other components within normal limits  GLUCOSE, CAPILLARY - Abnormal; Notable for the following:    Glucose-Capillary 128 (*)    All other components within normal limits  BASIC METABOLIC PANEL - Abnormal; Notable for the following:    Glucose, Bld 160 (*)    Calcium 10.6 (*)    GFR calc non Af Amer 58 (*)    GFR calc Af Amer 67 (*)    All other components within normal limits  BLOOD GAS, ARTERIAL - Abnormal; Notable for the following:    pH, Arterial 7.472 (*)    pO2, Arterial 60.2 (*)    Bicarbonate 25.9 (*)    Acid-Base Excess 2.5 (*)    All other components  within normal limits  HEPARIN LEVEL - Abnormal; Notable for the following:    Heparin Unfractionated <0.10 (*)    All other components within normal limits  BASIC METABOLIC PANEL - Abnormal; Notable for the following:    Potassium 3.0 (*)    Glucose, Bld  152 (*)    GFR calc non Af Amer 66 (*)    GFR calc Af Amer 76 (*)    All other components within normal limits  CBC - Abnormal; Notable for the following:    WBC 11.0 (*)    All other components within normal limits  BLOOD GAS, ARTERIAL - Abnormal; Notable for the following:    pH, Arterial 7.497 (*)    pCO2 arterial 33.5 (*)    Bicarbonate 25.7 (*)    Acid-Base Excess 2.6 (*)    All other components within normal limits  GLUCOSE, CAPILLARY - Abnormal; Notable for the following:    Glucose-Capillary 197 (*)    All other components within normal limits  GLUCOSE, CAPILLARY - Abnormal; Notable for the following:    Glucose-Capillary 200 (*)    All other components within normal limits  BLOOD GAS, ARTERIAL - Abnormal; Notable for the following:    pO2, Arterial 288.0 (*)    Bicarbonate 26.4 (*)    Acid-Base Excess 2.3 (*)    All other components within normal limits  GLUCOSE, CAPILLARY - Abnormal; Notable for the following:    Glucose-Capillary 175 (*)    All other components within normal limits  PROTIME-INR - Abnormal; Notable for the following:    Prothrombin Time 16.4 (*)    All other components within normal limits  GLUCOSE, CAPILLARY - Abnormal; Notable for the following:    Glucose-Capillary 166 (*)    All other components within normal limits  GLUCOSE, CAPILLARY - Abnormal; Notable for the following:    Glucose-Capillary 120 (*)    All other components within normal limits  GLUCOSE, CAPILLARY - Abnormal; Notable for the following:    Glucose-Capillary 195 (*)    All other components within normal limits  GLUCOSE, CAPILLARY - Abnormal; Notable for the following:    Glucose-Capillary 180 (*)    All other components  within normal limits  HEPARIN LEVEL - Abnormal; Notable for the following:    Heparin Unfractionated 0.22 (*)    All other components within normal limits  GLUCOSE, CAPILLARY - Abnormal; Notable for the following:    Glucose-Capillary 185 (*)    All other components within normal limits  SODIUM - Abnormal; Notable for the following:    Sodium 146 (*)    All other components within normal limits  SODIUM - Abnormal; Notable for the following:    Sodium 148 (*)    All other components within normal limits  GLUCOSE, CAPILLARY - Abnormal; Notable for the following:    Glucose-Capillary 159 (*)    All other components within normal limits  SODIUM - Abnormal; Notable for the following:    Sodium 150 (*)    All other components within normal limits  SODIUM - Abnormal; Notable for the following:    Sodium 152 (*)    All other components within normal limits  GLUCOSE, CAPILLARY - Abnormal; Notable for the following:    Glucose-Capillary 174 (*)    All other components within normal limits  GLUCOSE, CAPILLARY - Abnormal; Notable for the following:    Glucose-Capillary 170 (*)    All other components within normal limits  GLUCOSE, CAPILLARY - Abnormal; Notable for the following:    Glucose-Capillary 186 (*)    All other components within normal limits  GLUCOSE, CAPILLARY - Abnormal; Notable for the following:    Glucose-Capillary 151 (*)    All other components within normal limits  SODIUM - Abnormal; Notable for the following:  Sodium 155 (*)    All other components within normal limits  GLUCOSE, CAPILLARY - Abnormal; Notable for the following:    Glucose-Capillary 169 (*)    All other components within normal limits  GLUCOSE, CAPILLARY - Abnormal; Notable for the following:    Glucose-Capillary 162 (*)    All other components within normal limits  GLUCOSE, CAPILLARY - Abnormal; Notable for the following:    Glucose-Capillary 163 (*)    All other components within normal limits     CBC    Component Value Date/Time   WBC 11.0* 11/22/2010 0630   RBC 4.91 11/22/2010 0630   HGB 15.0 11/22/2010 0630   HCT 43.1 11/22/2010 0630   PLT 270 11/22/2010 0630   MCV 87.8 11/22/2010 0630   MCH 30.5 11/22/2010 0630   MCHC 34.8 11/22/2010 0630   RDW 15.0 11/22/2010 0630   LYMPHSABS 5.0* 11/19/2010 1715   MONOABS 0.7 11/19/2010 1715   EOSABS 0.3 11/19/2010 1715   BASOSABS 0.0 11/19/2010 1715   CMP     Component Value Date/Time   NA 155* 11/24/2010 0500   K 3.0* 11/22/2010 0630   CL 99 11/22/2010 0630   CO2 26 11/22/2010 0630   GLUCOSE 152* 11/22/2010 0630   BUN 16 11/22/2010 0630   CREATININE 1.20 11/22/2010 0630   CALCIUM 10.3 11/22/2010 0630   PROT 7.9 11/19/2010 1715   ALBUMIN 3.4* 11/19/2010 1715   AST 32 11/19/2010 1715   ALT 25 11/19/2010 1715   ALKPHOS 73 11/19/2010 1715   BILITOT 0.4 11/19/2010 1715   GFRNONAA 66* 11/22/2010 0630   GFRAA 76* 11/22/2010 0630   COAGS     Lipid Panel     Component Value Date/Time   CHOL 203* 11/20/2010 1109   TRIG 158* 11/20/2010 1109   HDL 51 11/20/2010 1109   CHOLHDL 4.0 11/20/2010 1109   VLDL 32 11/20/2010 1109   LDLCALC 120* 11/20/2010 1109   hgba1C    Lab Results  Component Value Date   HGBA1C 8.2* 11/20/2010    Dg Chest Portable 1 View  11/22/2010  *RADIOLOGY REPORT*  Clinical Data: Central line placement.  PORTABLE CHEST - 1 VIEW  Comparison: Chest radiograph performed earlier today at 01:30 a.m.  Findings: The patient's endotracheal tube is seen ending 4-5 cm above the carina. The patient's enteric tube is seen extending below the diaphragm.  A right subclavian line is seen ending about the distal SVC.  Lung expansion is relatively stable.  Previously noted right basilar opacity has resolved.  Underlying vascular congestion is noted, similar in appearance to the prior study. Obscuration of the left lung base likely reflects atelectasis.  No definite pleural effusion or pneumothorax is seen.  The cardiomediastinal  silhouette remains mildly enlarged.  No acute osseous abnormalities are identified.  IMPRESSION:  1.  Right subclavian line noted ending about the distal SVC. 2.  Persistent vascular congestion and mild cardiomegaly, without significant pulmonary edema. 3.  Obscuration of the left hemidiaphragm likely reflects atelectasis.  Original Report Authenticated By: Tonia Ghent, M.D.   Dg Abd Portable 1v  11/22/2010  *RADIOLOGY REPORT*  Clinical Data: Panda placement  ABDOMEN - 1 VIEW  Comparison: None  Findings: There is no evidence of bowel obstruction.  Feeding tube with tip in the gastric body with the tip pointing leftward.  IMPRESSION: Feeding tube with tip in the gastric body.  Original Report Authenticated By: Genevive Bi, M.D.   Ct Portable Head W/o Cm  11/24/2010  *RADIOLOGY REPORT*  Clinical Data: Follow-up hemorrhage.  Followup hydrocephalus.  CT HEAD WITHOUT CONTRAST  Technique:  Contiguous axial images were obtained from the base of the skull through the vertex without contrast.  Comparison: 11/22/2010  Findings: Right frontal ventricular drainage catheter has been placed with improved hydrocephalus. There appears to be a small hemorrhage surrounding the catheter in the right frontal lobe, without significant mass effect.  The tip of the catheter lies in the anterior third ventricle. Intraventricular hemorrhage layers in the right greater than left lateral ventricles.  Hemorrhagic right cerebellar infarct persists with significant right to left shift.  There is obliteration of the basilar cisterns. The fourth ventricle is compressed and displaced.  The tonsils appear impacted in the foramen magnum.  Upward and downward transtentorial herniation is suspected.  There appears to be edema involving the pons, greater on the right, which could represent extension of infarction.  IMPRESSION: Improved hydrocephalus status post ventricular drainage. A small hemorrhage surrounding the catheter does not  appear clinically significant.  Hemorrhagic right cerebellar infarct with significant mass effect on surrounding structures; upward and downward transdural herniation not excluded.  Original Report Authenticated By: Elsie Stain, M.D.    Physical Exam:  patient is intubated he is not on any sedation. His is comatose and unresponsive. Pupils are 3 mm sluggishly reactive. Doll's eye movements are present. He has slight left gaze deviation. Corneal refelexes are sluggish but present. He has a weak cough and gag reflex. There are no spontaneous extremity movements noted. He has minimal ewthdrawal of both lower extremity to pain. There is no withdrawal in the upper extremities. There is minimal withdrawal in both lower extremities to pain. Both plantars are upgoing.   ASSESSMENT Patient Active Problem List  Diagnoses  . Stroke  . Hypertensive emergency  . Pulmonary edema  . Acute exacerbation of congestive heart failure  . Hypoxemia  . Diabetes mellitus  . Respiratory failure  . A-fib  . Gout  . Encephalopathy  . CAD (coronary artery disease)  . Pneumonia, organism unspecified  . Thyroiditis  . Hyperlipemia   [name] is a 55 y.o. male with a left-sided weakness and dizziness due to a right hemispheric right brain cardioembolic infarct secondary to atrial fibrillation. On admission, suboptimal anticoagulation on Coumadin with INR of 1.7. Pulmonary edema and respiratory failure led to intubation. Patient treated with IV heparin. Follow-up CT scan shows hemorrhagic transformation in the large right cerebellar infarct with brainstem compression, hydrocephalus and cerebral edema. He was placed on 3% NS to decrease cerebral edema. His exam remains quiet poor.   Hospital day # 5  TREATMENT/PLAN Hold hypertonic saline as sodium at goal 155. Continue to checks serum sodium every 6 hourly. Continue ventriculostomy CSF drainage. Keep normothermic, euglycemic and euvolemic.   Plan family  meeting later this morning or tomorrow morning as per family's discretion. Prognosis remains guarded.The patient is medically ill and significant risk for neurological worsening, hydrocephalus and death. His care requires frequent neurological monitoring, hemodynamic monitoring, ventilatory support and medical decision making of high complexity. I spent 30 minutes of neurological critical care time in involvement of this patient's care.I discussed plan of care with CCM MD , Neurosurgery and family. He will likely need tracheostomy and PEG tube placement over the next few days if family is agreeable  SHARON BIBY, AVNP, ANP-BC, GNP-BC [facility] Pager: [phone] 11/24/2010 8:14 AM

## 2010-11-25 ENCOUNTER — Inpatient Hospital Stay (HOSPITAL_COMMUNITY): Payer: 59

## 2010-11-25 LAB — BLOOD GAS, ARTERIAL
Acid-Base Excess: 3.3 mmol/L — ABNORMAL HIGH (ref 0.0–2.0)
Drawn by: 23404
FIO2: 0.3 %
MECHVT: 500 mL
O2 Saturation: 98.4 %
Patient temperature: 98.6
RATE: 16 resp/min

## 2010-11-25 LAB — CBC
Hemoglobin: 12 g/dL — ABNORMAL LOW (ref 13.0–17.0)
MCV: 92.9 fL (ref 78.0–100.0)
Platelets: 267 10*3/uL (ref 150–400)
RBC: 4.1 MIL/uL — ABNORMAL LOW (ref 4.22–5.81)
WBC: 8.1 10*3/uL (ref 4.0–10.5)

## 2010-11-25 LAB — GLUCOSE, CAPILLARY
Glucose-Capillary: 178 mg/dL — ABNORMAL HIGH (ref 70–99)
Glucose-Capillary: 187 mg/dL — ABNORMAL HIGH (ref 70–99)
Glucose-Capillary: 191 mg/dL — ABNORMAL HIGH (ref 70–99)

## 2010-11-25 LAB — BASIC METABOLIC PANEL
CO2: 28 mEq/L (ref 19–32)
Chloride: 120 mEq/L — ABNORMAL HIGH (ref 96–112)
Sodium: 156 mEq/L — ABNORMAL HIGH (ref 135–145)

## 2010-11-25 LAB — URINE CULTURE
Colony Count: NO GROWTH
Culture  Setup Time: 201211122053

## 2010-11-25 MED ORDER — NICARDIPINE HCL IN NACL 20-0.86 MG/200ML-% IV SOLN
5.0000 mg/h | INTRAVENOUS | Status: DC
  Administered 2010-11-26 – 2010-11-27 (×3): 5 mg/h via INTRAVENOUS
  Filled 2010-11-25 (×3): qty 200

## 2010-11-25 MED ORDER — ENOXAPARIN SODIUM 40 MG/0.4ML ~~LOC~~ SOLN
40.0000 mg | SUBCUTANEOUS | Status: DC
  Administered 2010-11-25 – 2010-12-06 (×12): 40 mg via SUBCUTANEOUS
  Filled 2010-11-25 (×12): qty 0.4

## 2010-11-25 MED ORDER — FREE WATER
200.0000 mL | Freq: Four times a day (QID) | Status: DC
  Administered 2010-11-25 – 2010-12-01 (×22): 200 mL
  Filled 2010-11-25 (×26): qty 200

## 2010-11-25 MED ORDER — INSULIN GLARGINE 100 UNIT/ML ~~LOC~~ SOLN
5.0000 [IU] | Freq: Two times a day (BID) | SUBCUTANEOUS | Status: DC
  Administered 2010-11-25 – 2010-11-26 (×3): 5 [IU] via SUBCUTANEOUS
  Filled 2010-11-25: qty 3

## 2010-11-25 NOTE — Progress Notes (Addendum)
[name] is a 55 y.o. male former smoker admitted on 11/19/2010 with acute onset of slurred speech and Lt sided weakness.  Intubated in ED for airway protection and pulmonary edema.  Had spontaneous resolution of neuro symptoms. PMHx DM, CAD, CHF, HTN, Gout, A fib  Line/tubes: ETT 11/7>>11/9, 11/10 (cerebellar hge) >> Ventric 11/10 (dr Sherlon Handing emergent) > R [place] TLC 11/7>>>  Cx: Blood 11/8>>NTD Sputum 11/8>>Moderate Staph aureus. Blood 11/12>>> Urine 11/12>>> Sputum 11/12: abundant GPC pairs/abundant WBC>>>  Abx: Rocephin 11/8>>11/12 Zithromax 11/8>>11/12 Zosyn 11/12>>> Vancomycin 11/12>>>   Best practice: Pepcid Heparin gtt/coumadin for A fib >>  stopped 11/19   Consults: Neuro Cardiology [name])  Tests/events: 11/7: CT head>>negative 11/7: CT chest>>b/l GGO more at bases, small b/l effusions, no PE, dependent ATX 11/8: MRI head w/o contrast>>Acute/subacute non-hemorrhagic infarct Rt superior cerebellar artery territory, remote Lt cerebellar infarct, multiple punctated areas of remote hemorrhage compatible with amyloid angiopathy 11/8: Echo>>severe LVH, EF 25 to 35%, mild AR, mild MR, PAS 11/10 - CT head am: marked hgic transformation of rt cerebaellar infarct -> supratentorial hydrocephalus -> ventric drain 11/10 - GCS 5 then reintubated.  SUBJECTIVE:   OBJECTIVE:  Temp:  [98.4 F (36.9 C)-99.7 F (37.6 C)] 99.7 F (37.6 C) (11/13 0715) Pulse Rate:  [40-124] 98  (11/13 0900) Resp:  [13-25] 14  (11/13 0900) BP: (85-178)/(64-120) 125/85 mmHg (11/13 0900) SpO2:  [92 %-100 %] 100 % (11/13 0900) FiO2 (%):  [30 %] 30 % (11/13 0839) Weight:  [97.7 kg (215 lb 6.2 oz)] 215 lb 6.2 oz (97.7 kg) (11/13 0435)   Intake/Output Summary (Last 24 hours) at 11/25/10 0913 Last data filed at 11/25/10 0848  Gross per 24 hour  Intake 3143.5 ml  Output   2450 ml  Net  693.5 ml   General - obese, GCS 3 -5 ENT: - Pupils reactive Cardiac - irregularly  irregular Chest - Coarse BS bilaterally Abd - soft, non-tender, ND and +BS Ext - no edema but some bruises in left arm and forearm ? Related to high INR and bp cuff: stable since 11/10 Neuro - GCS 5. Gag +. Doing PSV.   Lab Results  Component Value Date   CREATININE 1.45* 11/25/2010   BUN 33* 11/25/2010   NA 156* 11/25/2010   K 3.9 11/25/2010   CL 120* 11/25/2010   CO2 28 11/25/2010   CBC    Component Value Date/Time   WBC 8.1 11/25/2010 0500   RBC 4.10* 11/25/2010 0500   HGB 12.0* 11/25/2010 0500   HCT 38.1* 11/25/2010 0500   PLT 267 11/25/2010 0500   MCV 92.9 11/25/2010 0500   MCH 29.3 11/25/2010 0500   MCHC 31.5 11/25/2010 0500   RDW 15.3 11/25/2010 0500   LYMPHSABS 5.0* 11/19/2010 1715   MONOABS 0.7 11/19/2010 1715   EOSABS 0.3 11/19/2010 1715   BASOSABS 0.0 11/19/2010 1715   Lab Results  Component Value Date   TSH 0.895 11/20/2010   FreeT4>>1.82 11/20/2010   ASSESSMENT/PLAN:  1) Cerebellar Hemorrhage/Coma, now persistent vegetative state. Still hypertonic saline. Prognosis remains very poor Plan: - neurology and neurosurgery and family meeting per dr. Pearlean Brownie.   2) Acute resp failure: though doing SBT he does not qualify for Extubation due to inability to protect airway. PLAN - Place on PS as tolerated. - will need either trach or palliative care  3) A. Fib -heparin stopped following cereblar hemorrhage 11/9/12el PLAN monitor  4) Hypertension.   - off cardene and bP MAP 135 currently.  But also on PSV. Rn thinks pain response + Plan: - prn fentanyl for pain - if bp still high go back on cardene gtt per prior order if : SBP >180 mmHg or MAP >130 mmHg for goal , target MAP of 110 mmHg or target blood pressure of 160/90 mmHg  5) h/o CAD Plan: -monitor  6) H/O Gout Plan: -will need to resume allopurinol, colchicine when able to take oral medication  7) Right lower lobe airspace, possible pneumonia (nos to date) Plan: -HCAP coverage, narrow when cultures  available  8) Thyroiditis Plan: -CT images d/w radiology with concerns for thyroid inflammation -f/u mild elevation in FT4, and low normal TSH>>will monitor for now  9) acute renal failure, slight worsening of renal failure Recent Labs  Basename 11/25/10 0500 11/24/10 1200   CREATININE 1.45* 1.33  ] Plan: -keep euvolemic -stop lasix  10) Hypernatremia. Slightly worse.   Lab 11/25/10 0500 11/25/10 11/24/10 1800  NA 156* 155* 155*  ] Plan: -stop diuresis -free water replacement  11) Hyperglycemia Plan: -ssi, increase lantus  Disposition -keep in ICU - no family at bedside currently (RN stated that they seemed upset that IV heparin caused deterioration)  Critical care time 35 minutes.  BABCOCK,PETE Pager:  [phone] 11/25/2010, 9:13 AM    Patient seen and examined, agree with above note.  I dictated the care and orders written for this patient under my direction.  CC time of 35 min  YACOUB,WESAM, M.D.

## 2010-11-25 NOTE — Progress Notes (Signed)
Stroke Team Progress Note  SUBJECTIVE Family at bedside.condition remains unchanged. He is comatose with minimum response to painful stimuli in the extremities. He hasn't started on tube feeds. He has been off for 3% saline  But sodium still is within goals. I met with his brother and discuss his prognosis and plan of care. Family wants to continue full support hence we will need to do tracheostomy and PEG tube placement this week.  OBJECTIVE Vital signs: Temp: 99.7 F (37.6 C) (11/13 0715) Temp src: Oral (11/13 0715) BP: 138/93 mmHg (11/13 0715) Pulse Rate: 111  (11/13 0715) Respiratory Rate: 16  Intake/Output from previous day: 11/12 0701 - 11/13 0700 In: 3136 [I.V.:1277.5; NG/GT:980; IV Piggyback:878.5] Out: 2753 [Urine:2490; Drains:263]   IV Fluid Intake:    . feeding supplement (PIVOT 1.5 CAL) 1,000 mL (11/24/10 1304)  . niCARDipine 5 mg/hr (11/25/10 0700)  . sodium chloride Stopped (11/24/10 0830)  . DISCONTD: dextrose    . DISCONTD: feeding supplement (JEVITY 1.2) 1,000 mL (11/24/10 1130)    Diet: NPO, panda tube feedings @ 30 mL/hr  Activity: bedrest  DVT Prophylaxis:  SCDs   Studies:   CBC    Component Value Date/Time   WBC 8.1 11/25/2010 0500   RBC 4.10* 11/25/2010 0500   HGB 12.0* 11/25/2010 0500   HCT 38.1* 11/25/2010 0500   PLT 267 11/25/2010 0500   MCV 92.9 11/25/2010 0500   MCH 29.3 11/25/2010 0500   MCHC 31.5 11/25/2010 0500   RDW 15.3 11/25/2010 0500   LYMPHSABS 5.0* 11/19/2010 1715   MONOABS 0.7 11/19/2010 1715   EOSABS 0.3 11/19/2010 1715   BASOSABS 0.0 11/19/2010 1715   CMP     Component Value Date/Time   NA 156* 11/25/2010 0500   K 3.9 11/25/2010 0500   CL 120* 11/25/2010 0500   CO2 28 11/25/2010 0500   GLUCOSE 211* 11/25/2010 0500   BUN 33* 11/25/2010 0500   CREATININE 1.45* 11/25/2010 0500   CALCIUM 10.3 11/25/2010 0500   PROT 7.9 11/19/2010 1715   ALBUMIN 3.4* 11/19/2010 1715   AST 32 11/19/2010 1715   ALT 25 11/19/2010 1715   ALKPHOS  73 11/19/2010 1715   BILITOT 0.4 11/19/2010 1715   GFRNONAA 52* 11/25/2010 0500   GFRAA 61* 11/25/2010 0500       Lipid Panel     Component Value Date/Time   CHOL 203* 11/20/2010 1109   TRIG 158* 11/20/2010 1109   HDL 51 11/20/2010 1109   CHOLHDL 4.0 11/20/2010 1109   VLDL 32 11/20/2010 1109   LDLCALC 120* 11/20/2010 1109   hgba1C    Lab Results  Component Value Date   HGBA1C 8.2* 11/20/2010    Dg Chest Portable 1 View  11/25/2010  *RADIOLOGY REPORT*  Clinical Data: Endotracheal tube placement  PORTABLE CHEST - 1 VIEW  Comparison: November 22, 2010  Findings: Endotracheal tube is in grossly good position with tip several centimeters above the carina.  Dobbhoff tube passes through esophagus into stomach.  Mild cardiomegaly is noted.  No change is noted with right subclavian catheter line.  No acute pulmonary disease is noted.  IMPRESSION: Endotracheal tube in grossly good position.  Original Report Authenticated By: Venita Sheffield., M.D.    Physical Exam:  patient is intubated he is not on any sedation. His is comatose and unresponsive. Pupils are 3 mm sluggishly reactive. Doll's eye movements are present There are few spontaneous side to side roving eye movements now.. He has slight left gaze deviation.  Corneal refelexes are sluggish but present. He has a weak cough and gag reflex. There are no spontaneous extremity movements noted. He has minimal wthdrawal of all extremities to pain today. There is no withdrawal in the upper extremities. There is minimal withdrawal in both lower extremities to pain. Both plantars are upgoing.   ASSESSMENT Patient Active Problem List  Diagnoses  . Stroke  . Hypertensive emergency  . Pulmonary edema  . Acute exacerbation of congestive heart failure  . Hypoxemia  . Diabetes mellitus  . Respiratory failure  . A-fib  . Gout  . Encephalopathy  . CAD (coronary artery disease)  . Pneumonia, organism unspecified  . Thyroiditis  . Hyperlipemia  .  Hypokalemia  . Volume overload   [name] with a left-sided weakness and dizziness due to a right hemispheric right brain cardioembolic infarct secondary to atrial fibrillation. On admission, suboptimal anticoagulation on Coumadin with INR of 1.7. Pulmonary edema and respiratory failure led to intubation. Patient treated with IV heparin. Follow-up CT scan shows hemorrhagic transformation in the large right cerebellar infarct with brainstem compression, hydrocephalus and cerebral edema. Anticoagulants stopped. He was placed on 3% NS to decrease cerebral edema. NA at goal yest and 3% on hold. Today his exam remains quiet poor. He will need trach & PEG for long-term survival. Long discussion with family.  Hospital day # 6  TREATMENT/PLAN Trach and PEG in next few days. Will have CCM discuss with family. lovenox for VTE prophy. Add antihypertensives via tube, wean cardene. CCM to increase sedation.D/w dr Molli Knock and patient`s brother x 10 minutes.This patient is critically ill and at significant risk of neurological worsening, death and care requires constant monitoring of vital signs, hemodynamics,respiratory and cardiac monitoring, neurological assessment, discussion with family, other specialists and medical decision making of high complexity. I spent 30 minutes of neurocritical care time  in the care of  this patient.   Joaquin Music, ANP-BC, GNP-BC [facility] Pager: [phone] 11/25/2010 8:11 AM

## 2010-11-25 NOTE — Progress Notes (Signed)
UR Completed.  [name] [name] 11/25/2010  

## 2010-11-25 NOTE — Progress Notes (Signed)
Subjective: Comatose, intubated Objective: Vital signs in last 24 hours: Temp:  [98.4 F (36.9 C)-99.7 F (37.6 C)] 99.7 F (37.6 C) (11/13 0715) Pulse Rate:  [40-124] 98  (11/13 0900) Resp:  [13-25] 14  (11/13 0900) BP: (85-178)/(64-120) 125/85 mmHg (11/13 0900) SpO2:  [92 %-100 %] 100 % (11/13 0900) FiO2 (%):  [30 %] 30 % (11/13 0839) Weight:  [97.7 kg (215 lb 6.2 oz)] 215 lb 6.2 oz (97.7 kg) (11/13 0435)  Intake/Output from previous day: 11/12 0701 - 11/13 0700 In: 3226 [I.V.:1277.5; NG/GT:1070; IV Piggyback:878.5] Out: 2753 [Urine:2490; Drains:263] Intake/Output this shift: Total I/O In: 120 [I.V.:90; NG/GT:30] Out: 161 [Urine:150; Drains:11]  neuro - ventric draining - Pt intubated - no response to pain - No sig changes   Lab Results:  Basename 11/25/10 0500  WBC 8.1  HGB 12.0*  HCT 38.1*  PLT 267   BMET  Basename 11/25/10 0500 11/25/10 11/24/10 1200  NA 156* 155* --  K 3.9 -- 3.7  CL 120* -- 118*  CO2 28 -- 28  GLUCOSE 211* -- 176*  BUN 33* -- 23  CREATININE 1.45* -- 1.33  CALCIUM 10.3 -- 10.1    Studies/Results: Ct Portable Head W/o Cm  11/24/2010  *RADIOLOGY REPORT*  Clinical Data: Follow-up hemorrhage.  Followup hydrocephalus.  CT HEAD WITHOUT CONTRAST  Technique:  Contiguous axial images were obtained from the base of the skull through the vertex without contrast.  Comparison: 11/22/2010  Findings: Right frontal ventricular drainage catheter has been placed with improved hydrocephalus. There appears to be a small hemorrhage surrounding the catheter in the right frontal lobe, without significant mass effect.  The tip of the catheter lies in the anterior third ventricle. Intraventricular hemorrhage layers in the right greater than left lateral ventricles.  Hemorrhagic right cerebellar infarct persists with significant right to left shift.  There is obliteration of the basilar cisterns. The fourth ventricle is compressed and displaced.  The tonsils appear  impacted in the foramen magnum.  Upward and downward transtentorial herniation is suspected.  There appears to be edema involving the pons, greater on the right, which could represent extension of infarction.  IMPRESSION: Improved hydrocephalus status post ventricular drainage. A small hemorrhage surrounding the catheter does not appear clinically significant.  Hemorrhagic right cerebellar infarct with significant mass effect on surrounding structures; upward and downward transdural herniation not excluded.  Original Report Authenticated By: Elsie Stain, M.D.    Assessment/Plan: No significant change - no other neurosurgical intervention indicated - Very poor prognosis   LOS: 6 days     Damean Poffenberger R, MD 11/25/2010, 9:23 AM

## 2010-11-25 NOTE — Progress Notes (Signed)
Chaplain Note:  Chaplain visited with pt and male who identifies [name] as pt's girlfriend.  Pt did not interact during this visit.  Pt's girlfriend indicated that she sees improvement in pt (eye, mouth movements).  Based on her observations she is encouraged that pt is going to recover.  Chaplain support was appreciated.  Chaplain will follow up as requested.  Verdie Shire, chaplain resident [phone])

## 2010-11-26 ENCOUNTER — Inpatient Hospital Stay (HOSPITAL_COMMUNITY): Payer: 59

## 2010-11-26 ENCOUNTER — Encounter (HOSPITAL_COMMUNITY): Payer: Self-pay | Admitting: Otolaryngology

## 2010-11-26 LAB — CULTURE, BLOOD (ROUTINE X 2)
Culture  Setup Time: 201211081630
Culture: NO GROWTH

## 2010-11-26 LAB — BASIC METABOLIC PANEL
BUN: 37 mg/dL — ABNORMAL HIGH (ref 6–23)
CO2: 27 mEq/L (ref 19–32)
Chloride: 117 mEq/L — ABNORMAL HIGH (ref 96–112)
Creatinine, Ser: 1.41 mg/dL — ABNORMAL HIGH (ref 0.50–1.35)

## 2010-11-26 LAB — GLUCOSE, CAPILLARY
Glucose-Capillary: 199 mg/dL — ABNORMAL HIGH (ref 70–99)
Glucose-Capillary: 190 mg/dL — ABNORMAL HIGH (ref 70–99)

## 2010-11-26 LAB — SODIUM
Sodium: 151 mEq/L — ABNORMAL HIGH (ref 135–145)
Sodium: 155 mEq/L — ABNORMAL HIGH (ref 135–145)

## 2010-11-26 LAB — BLOOD GAS, ARTERIAL
Bicarbonate: 25.5 mEq/L — ABNORMAL HIGH (ref 20.0–24.0)
MECHVT: 500 mL
O2 Saturation: 99.1 %
PEEP: 5 cmH2O
Patient temperature: 98.6
TCO2: 26.7 mmol/L (ref 0–100)

## 2010-11-26 MED ORDER — CLONIDINE HCL 0.2 MG PO TABS
0.2000 mg | ORAL_TABLET | Freq: Two times a day (BID) | ORAL | Status: DC
  Administered 2010-11-26 – 2010-11-29 (×7): 0.2 mg via ORAL
  Filled 2010-11-26 (×10): qty 1

## 2010-11-26 MED ORDER — LABETALOL HCL 5 MG/ML IV SOLN
10.0000 mg | INTRAVENOUS | Status: DC | PRN
  Administered 2010-11-26 – 2010-11-30 (×8): 10 mg via INTRAVENOUS
  Filled 2010-11-26 (×5): qty 4

## 2010-11-26 MED ORDER — INSULIN GLARGINE 100 UNIT/ML ~~LOC~~ SOLN
10.0000 [IU] | Freq: Two times a day (BID) | SUBCUTANEOUS | Status: DC
  Administered 2010-11-26 – 2010-11-30 (×9): 10 [IU] via SUBCUTANEOUS
  Filled 2010-11-26: qty 3

## 2010-11-26 MED ORDER — INSULIN GLARGINE 100 UNIT/ML ~~LOC~~ SOLN
5.0000 [IU] | Freq: Once | SUBCUTANEOUS | Status: AC
  Administered 2010-11-26: 5 [IU] via SUBCUTANEOUS
  Filled 2010-11-26: qty 3

## 2010-11-26 MED ORDER — SODIUM CHLORIDE 3 % IV SOLN
INTRAVENOUS | Status: DC
  Administered 2010-11-27 – 2010-11-28 (×3): via INTRAVENOUS
  Filled 2010-11-26 (×6): qty 500

## 2010-11-26 MED ORDER — INSULIN ASPART 100 UNIT/ML ~~LOC~~ SOLN
0.0000 [IU] | SUBCUTANEOUS | Status: DC
  Administered 2010-11-26: 4 [IU] via SUBCUTANEOUS
  Administered 2010-11-26: 7 [IU] via SUBCUTANEOUS
  Administered 2010-11-27: 4 [IU] via SUBCUTANEOUS
  Administered 2010-11-27 (×2): 7 [IU] via SUBCUTANEOUS
  Administered 2010-11-27: 4 [IU] via SUBCUTANEOUS
  Administered 2010-11-27 – 2010-11-28 (×3): 7 [IU] via SUBCUTANEOUS
  Administered 2010-11-28: 2 [IU] via SUBCUTANEOUS
  Administered 2010-11-28: 7 [IU] via SUBCUTANEOUS
  Administered 2010-11-28: 4 [IU] via SUBCUTANEOUS
  Administered 2010-11-28: 2 [IU] via SUBCUTANEOUS
  Administered 2010-11-28 – 2010-11-29 (×2): 4 [IU] via SUBCUTANEOUS
  Administered 2010-11-29: 2 [IU] via SUBCUTANEOUS
  Administered 2010-11-29: 4 [IU] via SUBCUTANEOUS
  Administered 2010-11-29 (×2): 7 [IU] via SUBCUTANEOUS
  Administered 2010-11-29 (×2): 4 [IU] via SUBCUTANEOUS
  Administered 2010-11-30 (×5): 7 [IU] via SUBCUTANEOUS
  Administered 2010-11-30: 4 [IU] via SUBCUTANEOUS
  Administered 2010-12-01 – 2010-12-02 (×7): 7 [IU] via SUBCUTANEOUS
  Administered 2010-12-02: 4 [IU] via SUBCUTANEOUS
  Administered 2010-12-02: 7 [IU] via SUBCUTANEOUS
  Administered 2010-12-02: 4 [IU] via SUBCUTANEOUS
  Administered 2010-12-02 – 2010-12-03 (×2): 7 [IU] via SUBCUTANEOUS
  Administered 2010-12-03: 2 [IU] via SUBCUTANEOUS
  Administered 2010-12-03 (×2): 7 [IU] via SUBCUTANEOUS
  Administered 2010-12-03: 2 [IU] via SUBCUTANEOUS
  Administered 2010-12-03: 4 [IU] via SUBCUTANEOUS
  Administered 2010-12-04: 2 [IU] via SUBCUTANEOUS
  Administered 2010-12-04 (×2): 4 [IU] via SUBCUTANEOUS
  Administered 2010-12-04 (×3): 2 [IU] via SUBCUTANEOUS
  Administered 2010-12-04 – 2010-12-05 (×6): 4 [IU] via SUBCUTANEOUS
  Administered 2010-12-06 (×2): 7 [IU] via SUBCUTANEOUS
  Administered 2010-12-06: 4 [IU] via SUBCUTANEOUS
  Administered 2010-12-06 (×2): 7 [IU] via SUBCUTANEOUS
  Administered 2010-12-06: 2 [IU] via SUBCUTANEOUS
  Administered 2010-12-07: 7 [IU] via SUBCUTANEOUS
  Administered 2010-12-07: 4 [IU] via SUBCUTANEOUS
  Administered 2010-12-07: 7 [IU] via SUBCUTANEOUS
  Administered 2010-12-07: 4 [IU] via SUBCUTANEOUS
  Administered 2010-12-07 – 2010-12-08 (×3): 7 [IU] via SUBCUTANEOUS
  Administered 2010-12-08: 4 [IU] via SUBCUTANEOUS
  Administered 2010-12-08: 7 [IU] via SUBCUTANEOUS
  Filled 2010-11-26 (×2): qty 3

## 2010-11-26 NOTE — Progress Notes (Signed)
  Subjective: Comatose , intubated  Objective: Vital signs in last 24 hours: Temp:  [98.4 F (36.9 C)-99.6 F (37.6 C)] 98.7 F (37.1 C) (11/14 0314) Pulse Rate:  [41-110] 45  (11/14 0900) Resp:  [12-23] 15  (11/14 0900) BP: (124-220)/(78-152) 150/112 mmHg (11/14 0900) SpO2:  [95 %-100 %] 100 % (11/14 0900) FiO2 (%):  [29.7 %-30.3 %] 30.3 % (11/14 0900) Weight:  [97.8 kg (215 lb 9.8 oz)] 215 lb 9.8 oz (97.8 kg) (11/14 0500)  Intake/Output from previous day: 11/13 0701 - 11/14 0700 In: 2408 [I.V.:443; NG/GT:1440; IV Piggyback:525] Out: 2698 [Urine:2435; Drains:263] Intake/Output this shift: Total I/O In: 152.5 [I.V.:20; NG/GT:120; IV Piggyback:12.5] Out: 339 [Urine:300; Emesis/NG output:10; Drains:29]  ventric draining - , no change  Lab Results:  Basename 11/25/10 0500  WBC 8.1  HGB 12.0*  HCT 38.1*  PLT 267   BMET  Basename 11/26/10 0410 11/26/10 11/25/10 0500  NA 155* 155* --  K 4.1 -- 3.9  CL 117* -- 120*  CO2 27 -- 28  GLUCOSE 191* -- 211*  BUN 37* -- 33*  CREATININE 1.41* -- 1.45*  CALCIUM 10.3 -- 10.3     Assessment/Plan: Nochange, poor prognosis - ventric draining - will start to wean  LOS: 7 days     Diahann Guajardo R, MD 11/26/2010, 9:17 AM

## 2010-11-26 NOTE — Progress Notes (Signed)
Pt on ICU Hyperglycemia Protocol.  Pt has 2 cbg's greater than 200 mg/dL yesterday, 16/10/96.  Protocol orders an IV insulin drip be started in this case.  If hyperglycemia due to tube feeds, can start TF coverage, but protocol mandates the IV insulin be started first to get control.  Lenor Coffin, RN, CNS, Diabetes Coordinator

## 2010-11-26 NOTE — Progress Notes (Signed)
Stroke Team Progress Note  SUBJECTIVE Brother at bedside. Rest of family coming at 10.he remains comatose and unresponsive. He has minimal withdrawal to painful stimuli. No new events noted. OBJECTIVE Most recent Vital Signs: Temp: 98.7 F (37.1 C) (11/14 0314) Temp src: Oral (11/14 0314) BP: 180/125 mmHg (11/14 0745) Pulse Rate: 58  (11/14 0745) Respiratory Rate: 15  Intake/Output from previous day: 11/13 0701 - 11/14 0700 In: 2378 [I.V.:443; NG/GT:1410; IV Piggyback:525] Out: 2698 [Urine:2435; Drains:263]   IV Fluid Intake:   none  Diet: NPO tube feelings @ 30 mL/hr  Activity: Bed rest  DVT Prophylaxis:  SCDs   Studies: CBC    Component Value Date/Time   WBC 8.1 11/25/2010 0500   RBC 4.10* 11/25/2010 0500   HGB 12.0* 11/25/2010 0500   HCT 38.1* 11/25/2010 0500   PLT 267 11/25/2010 0500   MCV 92.9 11/25/2010 0500   MCH 29.3 11/25/2010 0500   MCHC 31.5 11/25/2010 0500   RDW 15.3 11/25/2010 0500   LYMPHSABS 5.0* 11/19/2010 1715   MONOABS 0.7 11/19/2010 1715   EOSABS 0.3 11/19/2010 1715   BASOSABS 0.0 11/19/2010 1715   CMP    Component Value Date/Time   NA 155* 11/26/2010 0410   K 4.1 11/26/2010 0410   CL 117* 11/26/2010 0410   CO2 27 11/26/2010 0410   GLUCOSE 191* 11/26/2010 0410   BUN 37* 11/26/2010 0410   CREATININE 1.41* 11/26/2010 0410   CALCIUM 10.3 11/26/2010 0410   PROT 7.9 11/19/2010 1715   ALBUMIN 3.4* 11/19/2010 1715   AST 32 11/19/2010 1715   ALT 25 11/19/2010 1715   ALKPHOS 73 11/19/2010 1715   BILITOT 0.4 11/19/2010 1715   GFRNONAA 54* 11/26/2010 0410   GFRAA 63* 11/26/2010 0410   COAGS Lab Results  Component Value Date   INR 1.30 11/22/2010   INR 2.21* 11/20/2010   INR 1.78* 11/19/2010      Lipid Panel    Component Value Date/Time   CHOL 203* 11/20/2010 1109   TRIG 158* 11/20/2010 1109   HDL 51 11/20/2010 1109   CHOLHDL 4.0 11/20/2010 1109   VLDL 32 11/20/2010 1109   LDLCALC 120* 11/20/2010 1109   HgbA1C  Lab Results  Component Value Date     HGBA1C 8.2* 11/20/2010   CXR:  11/25/2010  *RADIOLOGY REPORT*  Clinical Data: Endotracheal tube placement  PORTABLE CHEST - 1 VIEW  Comparison: November 22, 2010  Findings: Endotracheal tube is in grossly good position with tip several centimeters above the carina.  Dobbhoff tube passes through esophagus into stomach.  Mild cardiomegaly is noted.  No change is noted with right subclavian catheter line.  No acute pulmonary disease is noted.  IMPRESSION: Endotracheal tube in grossly good position.  Original Report Authenticated By: Venita Sheffield., M.D.   Physical Exam:  patient is intubated he is not on any sedation. His is comatose and unresponsive. Pupils are 3 mm sluggishly reactive. Doll's eye movements are present There are few spontaneous side to side roving eye movements now.. He has slight left gaze deviation. Corneal refelexes are sluggish but present. He has a weak cough and gag reflex. There are no spontaneous extremity movements noted. He has minimal wthdrawal of all extremities to pain today. There is no withdrawal in the upper extremities. There is minimal withdrawal in both lower extremities to pain. Both plantars are upgoing.   ASSESSMENT Patient Active Problem List  Diagnoses  . Stroke  . Hypertensive emergency  . Pulmonary edema  .  Acute exacerbation of congestive heart failure  . Hypoxemia  . Diabetes mellitus  . Respiratory failure  . A-fib  . Gout  . Encephalopathy  . CAD (coronary artery disease)  . Pneumonia, organism unspecified  . Thyroiditis  . Hyperlipemia  . Hypokalemia  . Volume overload   [name] with a left-sided weakness and dizziness due to a right hemispheric right brain cardioembolic infarct secondary to atrial fibrillation. On admission, suboptimal anticoagulation on Coumadin with INR of 1.7. Pulmonary edema and respiratory failure led to intubation. Patient treated with IV heparin. Follow-up CT scan shows hemorrhagic  transformation in the large right cerebellar infarct with brainstem compression, hydrocephalus and cerebral edema. Anticoagulants stopped. He was placed on 3% NS to decrease cerebral edema. NA at goal yest and 3% on hold. Today his exam remains quiet poor. He will need trach & PEG for long-term survival.  Hospital day # 7  TREATMENT/PLAN Family conf once they arrive this am at 10a. Pt needs trach and PEG. Recommend to family to move forward.prognosis remains very poor but family wants to continue support.  Joaquin Music, ANP-BC, GNP-BC [facility] Pager: [phone] 11/26/2010 7:49 AM

## 2010-11-26 NOTE — Progress Notes (Signed)
1430: insulin drip not started, spoke with MD regarding changes in insulin regimen, orders received.  Holly Bodily

## 2010-11-26 NOTE — Progress Notes (Signed)
[name] is a 55 y.o. male former smoker admitted on 11/19/2010 with acute onset of slurred speech and Lt sided weakness.  Intubated in ED for airway protection and pulmonary edema.  Had spontaneous resolution of neuro symptoms. PMHx DM, CAD, CHF, HTN, Gout, A fib  Line/tubes: ETT 11/7>>11/9, 11/10 (cerebellar hge) >> Ventric 11/10 (dr Sherlon Handing emergent) > R [place] TLC 11/7>>>  Cx: Blood 11/8>>NTD Sputum 11/8>>Moderate Staph aureus. Blood 11/12>>> Urine 11/12>>> Sputum 11/12: abundant GPC pairs/abundant WBC>>>  Abx: Rocephin 11/8>>11/12 Zithromax 11/8>>11/12 Zosyn 11/12>>> Vancomycin 11/12>>>   Best practice: Pepcid Heparin gtt/coumadin for A fib >>  stopped 11/19   Consults: Neuro Cardiology [name])  Tests/events: 11/7: CT head>>negative 11/7: CT chest>>b/l GGO more at bases, small b/l effusions, no PE, dependent ATX 11/8: MRI head w/o contrast>>Acute/subacute non-hemorrhagic infarct Rt superior cerebellar artery territory, remote Lt cerebellar infarct, multiple punctated areas of remote hemorrhage compatible with amyloid angiopathy 11/8: Echo>>severe LVH, EF 25 to 35%, mild AR, mild MR, PAS 11/10 - CT head am: marked hgic transformation of rt cerebaellar infarct -> supratentorial hydrocephalus -> ventric drain 11/10 - GCS 5 then reintubated.  SUBJECTIVE:   OBJECTIVE:  Temp:  [98 F (36.7 C)-99.6 F (37.6 C)] 98 F (36.7 C) (11/14 0800) Pulse Rate:  [41-105] 54  (11/14 1100) Resp:  [11-23] 11  (11/14 1100) BP: (124-220)/(78-152) 139/101 mmHg (11/14 1100) SpO2:  [95 %-100 %] 100 % (11/14 1100) FiO2 (%):  [29.7 %-30.3 %] 29.9 % (11/14 1100) Weight:  [97.8 kg (215 lb 9.8 oz)] 215 lb 9.8 oz (97.8 kg) (11/14 0500)   Intake/Output Summary (Last 24 hours) at 11/26/10 1157 Last data filed at 11/26/10 1130  Gross per 24 hour  Intake   2483 ml  Output   2427 ml  Net     56 ml   General - obese, GCS 3 -5 ENT: - Pupils reactive Cardiac - irregularly  irregular Chest - Coarse BS bilaterally Abd - soft, non-tender, ND and +BS Ext - no edema but some bruises in left arm and forearm ? Related to high INR and bp cuff: stable since 11/10 Neuro - GCS 5. Gag +. Doing PSV.   Lab Results  Component Value Date   CREATININE 1.41* 11/26/2010   BUN 37* 11/26/2010   NA 155* 11/26/2010   K 4.1 11/26/2010   CL 117* 11/26/2010   CO2 27 11/26/2010   CBC    Component Value Date/Time   WBC 8.1 11/25/2010 0500   RBC 4.10* 11/25/2010 0500   HGB 12.0* 11/25/2010 0500   HCT 38.1* 11/25/2010 0500   PLT 267 11/25/2010 0500   MCV 92.9 11/25/2010 0500   MCH 29.3 11/25/2010 0500   MCHC 31.5 11/25/2010 0500   RDW 15.3 11/25/2010 0500   LYMPHSABS 5.0* 11/19/2010 1715   MONOABS 0.7 11/19/2010 1715   EOSABS 0.3 11/19/2010 1715   BASOSABS 0.0 11/19/2010 1715   Lab Results  Component Value Date   TSH 0.895 11/20/2010   FreeT4>>1.82 11/20/2010   ASSESSMENT/PLAN:  1) Cerebellar Hemorrhage/Coma, now persistent vegetative state. Still hypertonic saline. Prognosis remains very poor.  No reasonable chance are recovery. Plan: - Per neuro.   2) Acute resp failure: Due to inability to protect airway. PLAN - Place on PS as tolerated. - ENT to trach on Friday.  3) A. Fib: Heparin stopped following cereblar hemorrhage 11/9/12el PLAN - Monitor.  4) Hypertension.   - Off cardene and MAP 135 currently. But also on PSV. Rn thinks  pain response + Plan: - prn fentanyl for pain - if bp still high go back on cardene gtt per prior order if : SBP >180 mmHg or MAP >130 mmHg for goal , target MAP of 110 mmHg or target blood pressure of 160/90 mmHg  5) h/o CAD Plan: - Monitor  6) H/O Gout Plan: - Will need to resume allopurinol, colchicine when able to take oral medication.  7) Right lower lobe airspace, possible pneumonia (nos to date) Plan: - HCAP coverage, narrow when cultures available.  8) Thyroiditis Plan: - CT images d/w radiology with concerns  for thyroid inflammation - F/U mild elevation in FT4, and low normal TSH>>will monitor for now  9) acute renal failure, slight worsening of renal failure Plan: - Keep euvolemic. - Stop lasix.  10) Hypernatremia. Slightly worse, on triple H therapy..  Plan: - Stop diuresis. - D/C free water.  11) Hyperglycemia Plan: - SSI, increase lantus  Disposition - Keep in ICU - No family at bedside currently (RN stated that they seemed upset that IV heparin caused deterioration)  Critical care time 35 minutes.  [name] 11/26/2010, 11:57 AM  CC time of 35 min  [name], M.D.

## 2010-11-26 NOTE — Consult Note (Signed)
Reason for Consult: Chronic ventilator dependant - for tracheostomy Referring Physician: Plm/CCM  [name] is an 55 y.o. male.  HPI: History of CVA, on ventilator since November 7, neurologic prognosis very poor. Needs chronic airway support.  Past Medical History  Diagnosis Date  . Diabetes mellitus   . Coronary artery disease   . Hypertension   . Gout   . CHF (congestive heart failure)   . Afib   . Arthritis     History reviewed. No pertinent past surgical history.  History reviewed. No pertinent family history.  Social History:  reports that he quit smoking about 4 years ago. His smoking use included Cigarettes. He does not have any smokeless tobacco history on file. He reports that he drinks about 6.6 ounces of alcohol per week. He reports that he does not use illicit drugs.  Allergies: No Known Allergies  Medications: I have reviewed the patient's current medications.  Results for orders placed during the hospital encounter of 11/19/10 (from the past 48 hour(s))  GLUCOSE, CAPILLARY     Status: Abnormal   Collection Time   11/24/10  4:31 PM      Component Value Range Comment   Glucose-Capillary 165 (*) 70 - 99 (mg/dL)   CULTURE, RESPIRATORY     Status: Normal (Preliminary result)   Collection Time   11/24/10  5:15 PM      Component Value Range Comment   Specimen Description TRACHEAL ASPIRATE      Special Requests NONE      Gram Stain        Value: ABUNDANT WBC PRESENT, PREDOMINANTLY PMN     FEW SQUAMOUS EPITHELIAL CELLS PRESENT     RARE GRAM POSITIVE COCCI IN PAIRS   Culture Non-Pathogenic Oropharyngeal-type Flora Isolated.      Report Status PENDING     SODIUM     Status: Abnormal   Collection Time   11/24/10  6:00 PM      Component Value Range Comment   Sodium 155 (*) 135 - 145 (mEq/L)   GLUCOSE, CAPILLARY     Status: Abnormal   Collection Time   11/24/10  7:45 PM      Component Value Range Comment   Glucose-Capillary 197 (*) 70 - 99 (mg/dL)     SODIUM     Status: Abnormal   Collection Time   11/25/10 12:00 AM      Component Value Range Comment   Sodium 155 (*) 135 - 145 (mEq/L)   GLUCOSE, CAPILLARY     Status: Abnormal   Collection Time   11/25/10 12:04 AM      Component Value Range Comment   Glucose-Capillary 187 (*) 70 - 99 (mg/dL)   GLUCOSE, CAPILLARY     Status: Abnormal   Collection Time   11/25/10  3:53 AM      Component Value Range Comment   Glucose-Capillary 191 (*) 70 - 99 (mg/dL)   BLOOD GAS, ARTERIAL     Status: Abnormal   Collection Time   11/25/10  4:28 AM      Component Value Range Comment   FIO2 .30      Delivery systems VENTILATOR      Mode PRESSURE REGULATED VOLUME CONTROL      VT 500      Rate 16      Peep/cpap 5.0      pH, Arterial 7.437  7.350 - 7.450     pCO2 arterial 41.1  35.0 - 45.0 (mmHg)  pO2, Arterial 109.0 (*) 80.0 - 100.0 (mmHg)    Bicarbonate 27.3 (*) 20.0 - 24.0 (mEq/L)    TCO2 28.5  0 - 100 (mmol/L)    Acid-Base Excess 3.3 (*) 0.0 - 2.0 (mmol/L)    O2 Saturation 98.4      Patient temperature 98.6      Collection site RIGHT RADIAL      Drawn by [phone]      Sample type ARTERIAL      Allens test (pass/fail) PASS  PASS    CBC     Status: Abnormal   Collection Time   11/25/10  5:00 AM      Component Value Range Comment   WBC 8.1  4.0 - 10.5 (K/uL)    RBC 4.10 (*) 4.22 - 5.81 (MIL/uL)    Hemoglobin 12.0 (*) 13.0 - 17.0 (g/dL)    HCT 47.8 (*) 29.5 - 52.0 (%)    MCV 92.9  78.0 - 100.0 (fL)    MCH 29.3  26.0 - 34.0 (pg)    MCHC 31.5  30.0 - 36.0 (g/dL)    RDW 62.1  30.8 - 65.7 (%)    Platelets 267  150 - 400 (K/uL)   BASIC METABOLIC PANEL     Status: Abnormal   Collection Time   11/25/10  5:00 AM      Component Value Range Comment   Sodium 156 (*) 135 - 145 (mEq/L)    Potassium 3.9  3.5 - 5.1 (mEq/L)    Chloride 120 (*) 96 - 112 (mEq/L)    CO2 28  19 - 32 (mEq/L)    Glucose, Bld 211 (*) 70 - 99 (mg/dL)    BUN 33 (*) 6 - 23 (mg/dL)    Creatinine, Ser 8.46 (*) 0.50 - 1.35  (mg/dL)    Calcium 96.2  8.4 - 10.5 (mg/dL)    GFR calc non Af Amer 52 (*) >90 (mL/min)    GFR calc Af Amer 61 (*) >90 (mL/min)   PHOSPHORUS     Status: Normal   Collection Time   11/25/10  5:00 AM      Component Value Range Comment   Phosphorus 2.7  2.3 - 4.6 (mg/dL)   MAGNESIUM     Status: Abnormal   Collection Time   11/25/10  5:00 AM      Component Value Range Comment   Magnesium 2.6 (*) 1.5 - 2.5 (mg/dL)   GLUCOSE, CAPILLARY     Status: Abnormal   Collection Time   11/25/10  8:00 AM      Component Value Range Comment   Glucose-Capillary 178 (*) 70 - 99 (mg/dL)   SODIUM     Status: Abnormal   Collection Time   11/25/10 11:50 AM      Component Value Range Comment   Sodium 156 (*) 135 - 145 (mEq/L)   GLUCOSE, CAPILLARY     Status: Abnormal   Collection Time   11/25/10 12:23 PM      Component Value Range Comment   Glucose-Capillary 204 (*) 70 - 99 (mg/dL)   GLUCOSE, CAPILLARY     Status: Abnormal   Collection Time   11/25/10  4:24 PM      Component Value Range Comment   Glucose-Capillary 196 (*) 70 - 99 (mg/dL)    Comment 1 Documented in Chart      Comment 2 Notify RN     SODIUM     Status: Abnormal   Collection Time   11/25/10  6:19 PM      Component Value Range Comment   Sodium 156 (*) 135 - 145 (mEq/L)   GLUCOSE, CAPILLARY     Status: Abnormal   Collection Time   11/25/10  7:44 PM      Component Value Range Comment   Glucose-Capillary 191 (*) 70 - 99 (mg/dL)   GLUCOSE, CAPILLARY     Status: Abnormal   Collection Time   11/25/10 11:35 PM      Component Value Range Comment   Glucose-Capillary 185 (*) 70 - 99 (mg/dL)   SODIUM     Status: Abnormal   Collection Time   11/26/10 12:00 AM      Component Value Range Comment   Sodium 155 (*) 135 - 145 (mEq/L)   GLUCOSE, CAPILLARY     Status: Abnormal   Collection Time   11/26/10  3:12 AM      Component Value Range Comment   Glucose-Capillary 176 (*) 70 - 99 (mg/dL)   BLOOD GAS, ARTERIAL     Status: Abnormal    Collection Time   11/26/10  4:02 AM      Component Value Range Comment   FIO2 .30      Delivery systems VENTILATOR      Mode PRESSURE REGULATED VOLUME CONTROL      VT 500      Rate 16      Peep/cpap 5.0      pH, Arterial 7.435  7.350 - 7.450     pCO2 arterial 38.6  35.0 - 45.0 (mmHg)    pO2, Arterial 134.0 (*) 80.0 - 100.0 (mmHg)    Bicarbonate 25.5 (*) 20.0 - 24.0 (mEq/L)    TCO2 26.7  0 - 100 (mmol/L)    Acid-Base Excess 1.7  0.0 - 2.0 (mmol/L)    O2 Saturation 99.1      Patient temperature 98.6      Collection site RIGHT RADIAL      Drawn by [phone]      Sample type ARTERIAL DRAW      Allens test (pass/fail) PASS  PASS    BASIC METABOLIC PANEL     Status: Abnormal   Collection Time   11/26/10  4:10 AM      Component Value Range Comment   Sodium 155 (*) 135 - 145 (mEq/L)    Potassium 4.1  3.5 - 5.1 (mEq/L)    Chloride 117 (*) 96 - 112 (mEq/L)    CO2 27  19 - 32 (mEq/L)    Glucose, Bld 191 (*) 70 - 99 (mg/dL)    BUN 37 (*) 6 - 23 (mg/dL)    Creatinine, Ser 6.04 (*) 0.50 - 1.35 (mg/dL)    Calcium 54.0  8.4 - 10.5 (mg/dL)    GFR calc non Af Amer 54 (*) >90 (mL/min)    GFR calc Af Amer 63 (*) >90 (mL/min)   GLUCOSE, CAPILLARY     Status: Abnormal   Collection Time   11/26/10  8:22 AM      Component Value Range Comment   Glucose-Capillary 199 (*) 70 - 99 (mg/dL)   SODIUM     Status: Abnormal   Collection Time   11/26/10 11:36 AM      Component Value Range Comment   Sodium 149 (*) 135 - 145 (mEq/L)   GLUCOSE, CAPILLARY     Status: Abnormal   Collection Time   11/26/10 12:28 PM      Component Value Range Comment   Glucose-Capillary  235 (*) 70 - 99 (mg/dL)     Dg Chest Port 1 View  11/26/2010  *RADIOLOGY REPORT*  Clinical Data: Respiratory failure.  Endotracheal tube in place.  PORTABLE CHEST - 1 VIEW  Comparison: 11/25/2010  Findings: Endotracheal tube is in good position.  Feeding tube tip is below the diaphragm.  Right subclavian catheter tip is in the superior vena  cava in good position.  Heart size and vascularity are within normal limits.  Lungs are clear.  IMPRESSION: No change since prior study.  Clear lungs.  Original Report Authenticated By: Gwynn Burly, M.D.   Dg Chest Portable 1 View  11/25/2010  *RADIOLOGY REPORT*  Clinical Data: Endotracheal tube placement  PORTABLE CHEST - 1 VIEW  Comparison: November 22, 2010  Findings: Endotracheal tube is in grossly good position with tip several centimeters above the carina.  Dobbhoff tube passes through esophagus into stomach.  Mild cardiomegaly is noted.  No change is noted with right subclavian catheter line.  No acute pulmonary disease is noted.  IMPRESSION: Endotracheal tube in grossly good position.  Original Report Authenticated By: Venita Sheffield., M.D.    ROS: negative  Blood pressure 159/111, pulse 46, temperature 99 F (37.2 C), temperature source Oral, resp. rate 12, height 5\' 9"  (1.753 m), weight 97.8 kg (215 lb 9.8 oz), SpO2 100.00%.  PHYSICAL EXAM: On ventilator in vegetative state. Intubated orally. No oral or pharyngeal lesions or masses. No palpable neck masses.  Assessment/Plan: Chronic ventilator dependency secondary to CVA with vegetative state. Agree with need for tracheostomy for long term ventilatory support. Will discuss with family and have case tentatively scheduled for Friday morning.  Karas Pickerill H 11/26/2010, 3:37 PM

## 2010-11-27 ENCOUNTER — Inpatient Hospital Stay (HOSPITAL_COMMUNITY): Payer: 59

## 2010-11-27 DIAGNOSIS — I6340 Cerebral infarction due to embolism of unspecified cerebral artery: Secondary | ICD-10-CM

## 2010-11-27 DIAGNOSIS — R1314 Dysphagia, pharyngoesophageal phase: Secondary | ICD-10-CM

## 2010-11-27 DIAGNOSIS — R4020 Unspecified coma: Secondary | ICD-10-CM

## 2010-11-27 DIAGNOSIS — I629 Nontraumatic intracranial hemorrhage, unspecified: Secondary | ICD-10-CM

## 2010-11-27 DIAGNOSIS — J9600 Acute respiratory failure, unspecified whether with hypoxia or hypercapnia: Secondary | ICD-10-CM

## 2010-11-27 LAB — CBC
HCT: 38.1 % — ABNORMAL LOW (ref 39.0–52.0)
Hemoglobin: 11.9 g/dL — ABNORMAL LOW (ref 13.0–17.0)
MCH: 29.5 pg (ref 26.0–34.0)
MCHC: 31.2 g/dL (ref 30.0–36.0)
MCHC: 31.5 g/dL (ref 30.0–36.0)
MCV: 93.2 fL (ref 78.0–100.0)
MCV: 93.5 fL (ref 78.0–100.0)
Platelets: 274 10*3/uL (ref 150–400)
RDW: 15.2 % (ref 11.5–15.5)

## 2010-11-27 LAB — BASIC METABOLIC PANEL
BUN: 30 mg/dL — ABNORMAL HIGH (ref 6–23)
BUN: 33 mg/dL — ABNORMAL HIGH (ref 6–23)
CO2: 26 mEq/L (ref 19–32)
Calcium: 10.1 mg/dL (ref 8.4–10.5)
Chloride: 118 mEq/L — ABNORMAL HIGH (ref 96–112)
Creatinine, Ser: 1.17 mg/dL (ref 0.50–1.35)
Creatinine, Ser: 1.2 mg/dL (ref 0.50–1.35)
Glucose, Bld: 186 mg/dL — ABNORMAL HIGH (ref 70–99)
Glucose, Bld: 197 mg/dL — ABNORMAL HIGH (ref 70–99)
Potassium: 3.6 mEq/L (ref 3.5–5.1)

## 2010-11-27 LAB — SODIUM
Sodium: 152 mEq/L — ABNORMAL HIGH (ref 135–145)
Sodium: 158 mEq/L — ABNORMAL HIGH (ref 135–145)

## 2010-11-27 LAB — BLOOD GAS, ARTERIAL
Drawn by: 23404
MECHVT: 500 mL
PEEP: 5 cmH2O
Patient temperature: 98.6
pCO2 arterial: 42 mmHg (ref 35.0–45.0)
pH, Arterial: 7.425 (ref 7.350–7.450)

## 2010-11-27 LAB — CULTURE, RESPIRATORY W GRAM STAIN

## 2010-11-27 LAB — GLUCOSE, CAPILLARY: Glucose-Capillary: 202 mg/dL — ABNORMAL HIGH (ref 70–99)

## 2010-11-27 MED ORDER — AMLODIPINE 1 MG/ML ORAL SUSPENSION
5.0000 mg | Freq: Once | ORAL | Status: AC
  Administered 2010-11-27: 5 mg via ORAL
  Filled 2010-11-27 (×2): qty 5

## 2010-11-27 MED ORDER — FAMOTIDINE 40 MG/5ML PO SUSR
20.0000 mg | Freq: Two times a day (BID) | ORAL | Status: DC
  Administered 2010-11-27 – 2010-12-12 (×30): 20 mg
  Filled 2010-11-27 (×34): qty 2.5

## 2010-11-27 MED ORDER — POTASSIUM CHLORIDE 20 MEQ/15ML (10%) PO LIQD
40.0000 meq | Freq: Every day | ORAL | Status: AC
  Administered 2010-11-27: 40 meq via ORAL
  Filled 2010-11-27: qty 30

## 2010-11-27 MED ORDER — DILTIAZEM 12 MG/ML ORAL SUSPENSION
30.0000 mg | Freq: Four times a day (QID) | ORAL | Status: DC
  Administered 2010-11-27 – 2010-12-08 (×40): 30 mg
  Filled 2010-11-27 (×49): qty 3

## 2010-11-27 MED ORDER — PRO-STAT SUGAR FREE PO LIQD
30.0000 mL | Freq: Every day | ORAL | Status: AC
  Administered 2010-11-27 – 2010-12-01 (×22): 30 mL
  Filled 2010-11-27 (×22): qty 30

## 2010-11-27 MED ORDER — AMLODIPINE BESYLATE 10 MG PO TABS
10.0000 mg | ORAL_TABLET | Freq: Every day | ORAL | Status: DC
  Administered 2010-11-28 – 2010-12-07 (×10): 10 mg via ORAL
  Filled 2010-11-27 (×10): qty 1

## 2010-11-27 NOTE — Progress Notes (Signed)
Subjective: Intubated, comatose  Objective: Vital signs in last 24 hours: Temp:  [98 F (36.7 C)-99.6 F (37.6 C)] 99.2 F (37.3 C) (11/15 0400) Pulse Rate:  [30-109] 47  (11/15 0600) Resp:  [11-20] 17  (11/15 0600) BP: (117-206)/(79-166) 121/86 mmHg (11/15 0600) SpO2:  [99 %-100 %] 100 % (11/15 0600) FiO2 (%):  [29.5 %-30.5 %] 30.1 % (11/15 0600) Weight:  [97.6 kg (215 lb 2.7 oz)] 215 lb 2.7 oz (97.6 kg) (11/15 0438)  Intake/Output from previous day: 11/14 0701 - 11/15 0700 In: 3986.3 [I.V.:1511.3; NG/GT:1700; IV Piggyback:775] Out: 4268 [Urine:4010; Emesis/NG output:10; Drains:248] Intake/Output this shift:    neuro - no eye opening , no movement to pain - No change ventric draining at 14 cm  Lab Results:  Basename 11/27/10 0425 11/25/10 0500  WBC 8.8 8.1  HGB 11.9* 12.0*  HCT 38.1* 38.1*  PLT 271 267   BMET  Basename 11/27/10 0425 11/27/10 11/26/10 0410  NA 154* 152* --  K 3.6 -- 4.1  CL 118* -- 117*  CO2 29 -- 27  GLUCOSE 186* -- 191*  BUN 30* -- 37*  CREATININE 1.17 -- 1.41*  CALCIUM 9.7 -- 10.3      Assessment/Plan: No change, very poor prognosis - will cont to wean ventriculostomy  LOS: 8 days     Desaree Downen R, MD 11/27/2010, 7:10 AM

## 2010-11-27 NOTE — Progress Notes (Signed)
CSW spoke with pt brother and nephew at bedside. Noting family has elected for trach 11/16, and will continue to follow for possible Vent SNF placement.

## 2010-11-27 NOTE — Progress Notes (Signed)
[name] is a 55 y.o. male former smoker admitted on 11/19/2010 with acute onset of slurred speech and Lt sided weakness.  Intubated in ED for airway protection and pulmonary edema.  Had spontaneous resolution of neuro symptoms. PMHx DM, CAD, CHF, HTN, Gout, A fib.  Line/tubes: ETT 11/7>>11/9, 11/10 (cerebellar hge) >> Ventric 11/10 (dr Sherlon Handing emergent) > R [facility] 11/7>>>  Cx: Blood 11/8>>NTD Sputum 11/8>>Moderate Staph aureus. Blood 11/12>>>NTD Urine 11/12>>>NTD Sputum 11/12: abundant GPC pairs/abundant WBC>>>  Abx: Rocephin 11/8>>11/12 Zithromax 11/8>>11/12 Zosyn 11/12>>> Vancomycin 11/12>>>  Best practice: Pepcid Heparin gtt/coumadin for A fib >>  stopped 11/19   Consults: Neuro Cardiology [name])  Tests/events: 11/7: CT head>>negative 11/7: CT chest>>b/l GGO more at bases, small b/l effusions, no PE, dependent ATX 11/8: MRI head w/o contrast>>Acute/subacute non-hemorrhagic infarct Rt superior cerebellar artery territory, remote Lt cerebellar infarct, multiple punctated areas of remote hemorrhage compatible with amyloid angiopathy 11/8: Echo>>severe LVH, EF 25 to 35%, mild AR, mild MR, PAS 11/10 - CT head am: marked hgic transformation of rt cerebaellar infarct -> supratentorial hydrocephalus -> ventric drain 11/10 - GCS 5 then reintubated.  SUBJECTIVE:   OBJECTIVE:  Temp:  [98.3 F (36.8 C)-99.6 F (37.6 C)] 99.2 F (37.3 C) (11/15 0400) Pulse Rate:  [30-109] 61  (11/15 1045) Resp:  [11-20] 13  (11/15 1045) BP: (117-206)/(79-166) 123/87 mmHg (11/15 1045) SpO2:  [99 %-100 %] 100 % (11/15 1045) FiO2 (%):  [29.5 %-30.5 %] 30.2 % (11/15 1045) Weight:  [97.6 kg (215 lb 2.7 oz)] 215 lb 2.7 oz (97.6 kg) (11/15 0438)   Intake/Output Summary (Last 24 hours) at 11/27/10 1133 Last data filed at 11/27/10 1000  Gross per 24 hour  Intake 4018.75 ml  Output   4840 ml  Net -821.25 ml   General - obese, GCS 3 -5 ENT: - Pupils reactive Cardiac -  irregularly irregular Chest - Coarse BS bilaterally Abd - soft, non-tender, ND and +BS Ext - no edema but some bruises in left arm and forearm ? Related to high INR and bp cuff: stable since 11/10 Neuro - GCS 5. Gag +. Doing PSV.   Lab Results  Component Value Date   CREATININE 1.17 11/27/2010   BUN 30* 11/27/2010   NA 154* 11/27/2010   K 3.6 11/27/2010   CL 118* 11/27/2010   CO2 29 11/27/2010   CBC    Component Value Date/Time   WBC 8.8 11/27/2010 0425   RBC 4.09* 11/27/2010 0425   HGB 11.9* 11/27/2010 0425   HCT 38.1* 11/27/2010 0425   PLT 271 11/27/2010 0425   MCV 93.2 11/27/2010 0425   MCH 29.1 11/27/2010 0425   MCHC 31.2 11/27/2010 0425   RDW 15.3 11/27/2010 0425   LYMPHSABS 5.0* 11/19/2010 1715   MONOABS 0.7 11/19/2010 1715   EOSABS 0.3 11/19/2010 1715   BASOSABS 0.0 11/19/2010 1715   Lab Results  Component Value Date   TSH 0.895 11/20/2010   FreeT4>>1.82 11/20/2010   ASSESSMENT/PLAN:  1) Cerebellar Ischemic infract with ? Hemorrhage transformation/Coma, now persistent vegetative state. Still hypertonic saline per neuro. Prognosis remains very poor.  No reasonable chance are recovery from a neurologic standpoint.  Not a VP shunt candidate, unclear what will happen once ventriculostomy is out. Plan:   - Per neuro.   2) Acute resp failure: Due to inability to protect airway. PLAN - Place on PS as tolerated. - ENT to trach on Friday. - Unlikely to require vent post trach.  3) A. Fib:  Heparin stopped following cerebellar hemorrhage 11/21/10. PLAN - Monitor.  4) Hypertension: remains on cardene. PLAN: - PRN fentanyl for pain - Increase Norvasc to 10 mg po daily and hopefully titrate cardene to off today.  5) h/o CAD Plan: - Monitor  6) H/O Gout Plan: - Will need to resume allopurinol, colchicine when able to take oral medication.  7) Right lower lobe airspace, possible pneumonia (nos to date). Plan: - HCAP coverage. - D/C vancomycin.  8)  Thyroiditis Plan: - CT images d/w radiology with concerns for thyroid inflammation - F/U mild elevation in FT4, and low normal TSH>>will monitor for now  9) Acute renal failure, slight worsening of renal failure Plan: - Keep euvolemic. - Monitor BMP.  10) Hypernatremia. Slightly worse, on triple H therapy..  Plan: - No diuresis. - Per neuro.  11) Hyperglycemia Plan: - SSI, increase lantus  Critical care time 35 minutes.  YACOUB,WESAM 11/27/2010, 11:33 AM

## 2010-11-27 NOTE — Progress Notes (Signed)
Contacted Dr. Sherene Sires regarding the patient's 8 beat run of v-tach.  He stated to continue to monitor, but no interventions at this time.

## 2010-11-27 NOTE — Consult Note (Signed)
[facility] Consult: 3:32 PM 11/27/2010   Referring Provider: Jefm Miles, ccm  Primary Care Physician:  No primary provider on file. Primary Gastroenterologist: None found    Reason for Consultation:  PEG placement  HPI: [name] is a 55 y.o. male.  Pt. In vegetative state following embolic CVA with hemorrhagic transformation. S/P ventriculostomy tube placement 11/10.  Needs trach (planned for tomorrow) and PEG for long term survival. Incidental finding of thyroiditis on head CT, TSH normal and near normal T4. Also mild acute renal failure. Tolerating tube feeds via Dubhoff tube.  Hx Atrial Fib.  Was on Coumadin PTA but INR 1.7 on arrival.   No family currently at bedside.   Past Medical History  Diagnosis Date  . Diabetes mellitus   . Coronary artery disease   . Hypertension   . Gout   . CHF (congestive heart failure)   . Afib   . Arthritis     History reviewed. No pertinent past surgical history.  Prior to Admission medications   Medication Sig Start Date End Date Taking? Authorizing Provider  allopurinol (ZYLOPRIM) 300 MG tablet Take 300 mg by mouth daily.     Yes Historical Provider, MD  amLODipine (NORVASC) 5 MG tablet Take 5 mg by mouth daily.     Yes Historical Provider, MD  AMOXICILLIN PO Take 400 mg by mouth 2 (two) times daily.    Yes Historical Provider, MD  aspirin 81 MG tablet Take 81 mg by mouth daily.     Yes Historical Provider, MD  carvedilol (COREG) 25 MG tablet Take 25 mg by mouth 2 (two) times daily with a meal.     Yes Historical Provider, MD  celecoxib (CELEBREX) 200 MG capsule Take 200 mg by mouth 2 (two) times daily.     Yes Historical Provider, MD  cloNIDine (CATAPRES) 0.1 MG tablet Take 0.1 mg by mouth 2 (two) times daily.     Yes Historical Provider, MD  colchicine 0.6 MG tablet Take 0.6 mg by mouth daily. For gout    Yes Historical Provider, MD  cyclobenzaprine (FLEXERIL) 10 MG tablet Take 5-10 mg by  mouth 2 (two) times daily as needed. For muscle spasms    Yes Historical Provider, MD  furosemide (LASIX) 20 MG tablet Take 10-20 mg by mouth 2 (two) times daily. For pressure and fluid    Yes Historical Provider, MD  isosorbide-hydrALAZINE (BIDIL) 20-37.5 MG per tablet Take 1 tablet by mouth 3 (three) times daily.     Yes Historical Provider, MD  metFORMIN (GLUCOPHAGE-XR) 500 MG 24 hr tablet Take 1,000 mg by mouth 2 (two) times daily.     Yes Historical Provider, MD  Multiple Vitamins-Minerals (MULTIVITAMINS THER. W/MINERALS) TABS Take 1 tablet by mouth daily.     Yes Historical Provider, MD  naproxen sodium (ANAPROX) 220 MG tablet Take 220 mg by mouth 2 (two) times daily as needed. For pain    Yes Historical Provider, MD  ramipril (ALTACE) 10 MG capsule Take 10 mg by mouth 2 (two) times daily.     Yes Historical Provider, MD  spironolactone (ALDACTONE) 50 MG tablet Take 50 mg by mouth 2 (two) times daily.     Yes Historical Provider, MD  warfarin (COUMADIN) 7.5 MG tablet Take 7.5 mg by mouth daily.     Yes Historical Provider, MD    Scheduled Meds:    . amLODipine  5 mg Oral Once  . amLODipine  10 mg Oral Daily  . antiseptic  oral rinse  15 mL Mouth Rinse QID  . carvedilol  25 mg Oral BID WC  . chlorhexidine  15 mL Mouth Rinse BID  . cloNIDine  0.2 mg Oral BID  . diltiazem  30 mg Per Tube Q6H  . enoxaparin (LOVENOX) injection  40 mg Subcutaneous Q24H  . famotidine  20 mg Per Tube Every 12 hours (non-specified)  . feeding supplement  30 mL Per Tube 5 X Daily  . free water  200 mL Per Tube Q6H  . insulin aspart  0-7 Units Subcutaneous Q4H  . insulin glargine  10 Units Subcutaneous BID  . isosorbide-hydrALAZINE  1 tablet Oral TID  . piperacillin-tazobactam (ZOSYN)  IV  3.375 g Intravenous Q8H  . potassium chloride  40 mEq Oral Daily  . ramipril  10 mg Oral BID  . simvastatin  20 mg Oral q1800  . vancomycin  1,250 mg Intravenous Q12H               Infusions:    . feeding  supplement (PIVOT 1.5 CAL) 1,000 mL (11/27/10 1227)  . niCARDipine Stopped (11/27/10 1405)  . sodium chloride 60 mL/hr at 11/27/10 0829   PRN Meds: sodium chloride, acetaminophen, fentaNYL, labetalol, metoprolol   Allergies as of 11/19/2010  . (No Known Allergies)    History reviewed. No pertinent family history.  History   Social History  . Marital Status: Single    Spouse Name: N/A    Number of Children: N/A  . Years of Education: N/A   Occupational History  . Not on file.   Social History Main Topics  . Smoking status: Former Smoker    Types: Cigarettes    Quit date: 12/19/2005  . Smokeless tobacco: Not on file  . Alcohol Use: 6.6 oz/week    6 Glasses of wine, 0 Cans of beer, 5 Shots of liquor, 0 Drinks containing 0.5 oz of alcohol per week  . Drug Use: No  . Sexually Active:    Other Topics Concern  . Not on file   Social History Narrative  . No narrative on file    REVIEW OF SYSTEMS:  Unable to obtain as pt is comatose.  PHYSICAL EXAM: Vital signs in last 24 hours: Temp:  [98.1 F (36.7 C)-99.6 F (37.6 C)] 98.1 F (36.7 C) (11/15 1204) Pulse Rate:  [25-109] 37  (11/15 1400) Resp:  [11-20] 16  (11/15 1400) BP: (112-206)/(79-166) 128/103 mmHg (11/15 1400) SpO2:  [99 %-100 %] 100 % (11/15 1400) FiO2 (%):  [29.5 %-30.5 %] 30.3 % (11/15 1400) Weight:  [97.6 kg (215 lb 2.7 oz)] 215 lb 2.7 oz (97.6 kg) (11/15 0438)  General: Critically ill, with various support apparatus in place Head:  Ventriculostomy tube in place, stapled incision on skull  Eyes:  Closed, did not  open Nose:  Panda tube in place, no nasal bleeding Mouth:  ETT in place, lips and tongue swollen. Neck:  No JVD Lungs:  Clear in front Heart: Irreg/Irreg, rate not accelerated Abdomen:  Soft, no scars, no masses or organomegaly.  BS active. Benign.    Musc/Skeltl:  No joint deformities Extremities:  General edema of UE.  No LE edema. Feet warm  Neurologic:  Unresponsive  Skin:  No  sores, rash Tattoos:  None seen  Psych:  Unable to assess  Intake/Output from previous day: 11/14 0701 - 11/15 0700 In: 4118.8 [I.V.:1608.8; NG/GT:1735; IV Piggyback:775] Out: 4684 [Urine:4410; Emesis/NG output:10; Drains:264]  LAB RESULTS:  Basename 11/27/10 0425 11/25/10 0500  WBC 8.8 8.1  HGB 11.9* 12.0*  HCT 38.1* 38.1*  PLT 271 267   BMET Lab Results  Component Value Date   NA 156* 11/27/2010   NA 154* 11/27/2010   NA 154* 11/27/2010   K 3.6 11/27/2010   K 4.1 11/26/2010   K 3.9 11/25/2010   CL 118* 11/27/2010   CL 117* 11/26/2010   CL 120* 11/25/2010   CO2 29 11/27/2010   CO2 27 11/26/2010   CO2 28 11/25/2010   GLUCOSE 186* 11/27/2010   GLUCOSE 191* 11/26/2010   GLUCOSE 211* 11/25/2010   BUN 30* 11/27/2010   BUN 37* 11/26/2010   BUN 33* 11/25/2010   CREATININE 1.17 11/27/2010   CREATININE 1.41* 11/26/2010   CREATININE 1.45* 11/25/2010   CALCIUM 9.7 11/27/2010   CALCIUM 10.3 11/26/2010   CALCIUM 10.3 11/25/2010   LFT Lab Results  Component Value Date   PROT 7.9 11/19/2010   PROT 7.2 01/26/2010   ALBUMIN 3.4* 11/19/2010   ALBUMIN 3.6 01/26/2010   AST 32 11/19/2010   AST 32 01/26/2010   ALT 25 11/19/2010   ALT 33 01/26/2010   ALKPHOS 73 11/19/2010   ALKPHOS 68 01/26/2010   BILITOT 0.4 11/19/2010   BILITOT 0.7 01/26/2010   PT/INR Lab Results  Component Value Date   INR 1.30 11/22/2010   INR 2.21* 11/20/2010   INR 1.78* 11/19/2010   Drugs of Abuse  No results found for this basename: labopia, cocainscrnur, labbenz, amphetmu, thcu, labbarb     RADIOLOGY STUDIES: Dg Chest Port 1 View  11/27/2010  *RADIOLOGY REPORT*  Clinical Data: Evaluate ET tube placement  PORTABLE CHEST - 1 VIEW  Comparison: 11/26/2010  Findings: The ET tube tip is above the carina.  There is a feeding tube with tip in the stomach.  Right arm PICC line tip is in the SVC.  Stable cardiac enlargement.  Diminished aeration to the left lung base is identified.  Unchanged from previous exam.   IMPRESSION:  1. Decreased aeration to the left base, similar to previous exam. 2.  Stable support apparatus.  Original Report Authenticated By: Rosealee Albee, M.D.   ENDOSCOPIC STUDIES: None found in old records.  IMPRESSION: *  Neurogenic dysphagia following CVA.  Family desires full support, so PEG required for long term nutritional support. *  DM 2 *  Respiratory Failure, vent dependent.  For trach tomorrow.  PLAN:  * PEG next week, probably Tuesday 11/19 *  Will place consent and procedure orders.   LOS: 8 days   Jennye Moccasin  11/27/2010, 3:32 PM

## 2010-11-27 NOTE — Progress Notes (Signed)
Stroke Team Progress Note  SUBJECTIVE Brother at bedside. No change in clinical condition except remains tachycardic. Blood pressure is also high and his on the cardene drip He is back on 3% saline and sodium is in the goal range. Tracheostomy is planned by ENT for tomorrow.  OBJECTIVE Most recent Vital Signs: Temp: 99.2 F (37.3 C) (11/15 0400) Temp src: Axillary (11/15 0400) BP: 159/104 mmHg (11/15 0700) Pulse Rate: 70  (11/15 0700) Respiratory Rate: 19  Intake/Output from previous day: 11/14 0701 - 11/15 0700 In: 4118.8 [I.V.:1608.8; NG/GT:1735; IV Piggyback:775] Out: 4684 [Urine:4410; Emesis/NG output:10; Drains:264]   IV Fluid Intake:     . niCARDipine 5 mg/hr (11/27/10 0639)  . 3% sodium chloride 60 mL/hr at 11/26/10 1900  Normal saline at 20cc/hr  Diet: NPO , Jevity tube feedings via panda @ 35 mL/hr  Activity:  Bedrest  DVT Prophylaxis:  Lovenox 40 mg sq daily   Studies: Results for orders placed during the hospital encounter of 11/19/10 (from the past 24 hour(s))  SODIUM     Status: Abnormal   Collection Time   11/26/10 11:36 AM      Component Value Range   Sodium 149 (*) 135 - 145 (mEq/L)  GLUCOSE, CAPILLARY     Status: Abnormal   Collection Time   11/26/10 12:28 PM      Component Value Range   Glucose-Capillary 235 (*) 70 - 99 (mg/dL)  GLUCOSE, CAPILLARY     Status: Abnormal   Collection Time   11/26/10  4:06 PM      Component Value Range   Glucose-Capillary 190 (*) 70 - 99 (mg/dL)  SODIUM     Status: Abnormal   Collection Time   11/26/10  5:44 PM      Component Value Range   Sodium 151 (*) 135 - 145 (mEq/L)  GLUCOSE, CAPILLARY     Status: Abnormal   Collection Time   11/26/10  8:23 PM      Component Value Range   Glucose-Capillary 219 (*) 70 - 99 (mg/dL)  SODIUM     Status: Abnormal   Collection Time   11/27/10 12:00 AM      Component Value Range   Sodium 152 (*) 135 - 145 (mEq/L)  GLUCOSE, CAPILLARY     Status: Abnormal   Collection Time   11/27/10 12:03 AM      Component Value Range   Glucose-Capillary 211 (*) 70 - 99 (mg/dL)  GLUCOSE, CAPILLARY     Status: Abnormal   Collection Time   11/27/10  3:44 AM      Component Value Range   Glucose-Capillary 182 (*) 70 - 99 (mg/dL)  CBC     Status: Abnormal   Collection Time   11/27/10  4:25 AM      Component Value Range   WBC 8.8  4.0 - 10.5 (K/uL)   RBC 4.09 (*) 4.22 - 5.81 (MIL/uL)   Hemoglobin 11.9 (*) 13.0 - 17.0 (g/dL)   HCT 16.1 (*) 09.6 - 52.0 (%)   MCV 93.2  78.0 - 100.0 (fL)   MCH 29.1  26.0 - 34.0 (pg)   MCHC 31.2  30.0 - 36.0 (g/dL)   RDW 04.5  40.9 - 81.1 (%)   Platelets 271  150 - 400 (K/uL)  BASIC METABOLIC PANEL     Status: Abnormal   Collection Time   11/27/10  4:25 AM      Component Value Range   Sodium 154 (*) 135 - 145 (mEq/L)  Potassium 3.6  3.5 - 5.1 (mEq/L)   Chloride 118 (*) 96 - 112 (mEq/L)   CO2 29  19 - 32 (mEq/L)   Glucose, Bld 186 (*) 70 - 99 (mg/dL)   BUN 30 (*) 6 - 23 (mg/dL)   Creatinine, Ser 4.54  0.50 - 1.35 (mg/dL)   Calcium 9.7  8.4 - 09.8 (mg/dL)   GFR calc non Af Amer 68 (*) >90 (mL/min)   GFR calc Af Amer 79 (*) >90 (mL/min)  PHOSPHORUS     Status: Normal   Collection Time   11/27/10  4:25 AM      Component Value Range   Phosphorus 3.1  2.3 - 4.6 (mg/dL)  MAGNESIUM     Status: Normal   Collection Time   11/27/10  4:25 AM      Component Value Range   Magnesium 2.4  1.5 - 2.5 (mg/dL)  BLOOD GAS, ARTERIAL     Status: Abnormal   Collection Time   11/27/10  4:30 AM      Component Value Range   FIO2 .30     Delivery systems VENTILATOR     Mode PRESSURE REGULATED VOLUME CONTROL     VT 500     Rate 16     Peep/cpap 5.0     pH, Arterial 7.425  7.350 - 7.450    pCO2 arterial 42.0  35.0 - 45.0 (mmHg)   pO2, Arterial 119.0 (*) 80.0 - 100.0 (mmHg)   Bicarbonate 27.1 (*) 20.0 - 24.0 (mEq/L)   TCO2 28.4  0 - 100 (mmol/L)   Acid-Base Excess 3.0 (*) 0.0 - 2.0 (mmol/L)   O2 Saturation 98.7     Patient temperature 98.6      Collection site LEFT RADIAL     Drawn by [id number]     Sample type ARTERIAL DRAW     Allens test (pass/fail) PASS  PASS   SODIUM     Status: Abnormal   Collection Time   11/27/10  6:50 AM      Component Value Range   Sodium 154 (*) 135 - 145 (mEq/L)  GLUCOSE, CAPILLARY     Status: Abnormal   Collection Time   11/27/10  8:39 AM      Component Value Range   Glucose-Capillary 206 (*) 70 - 99 (mg/dL)       Dg Chest Port 1 View  11/27/2010  *RADIOLOGY REPORT*  Clinical Data: Evaluate ET tube placement  PORTABLE CHEST - 1 VIEW  Comparison: 11/26/2010  Findings: The ET tube tip is above the carina.  There is a feeding tube with tip in the stomach.  Right arm PICC line tip is in the SVC.  Stable cardiac enlargement.  Diminished aeration to the left lung base is identified.  Unchanged from previous exam.  IMPRESSION:  1. Decreased aeration to the left base, similar to previous exam. 2.  Stable support apparatus.  Original Report Authenticated By: Rosealee Albee, M.D.   Dg Chest Port 1 View  11/26/2010  *RADIOLOGY REPORT*  Clinical Data: Respiratory failure.  Endotracheal tube in place.  PORTABLE CHEST - 1 VIEW  Comparison: 11/25/2010  Findings: Endotracheal tube is in good position.  Feeding tube tip is below the diaphragm.  Right subclavian catheter tip is in the superior vena cava in good position.  Heart size and vascularity are within normal limits.  Lungs are clear.  IMPRESSION: No change since prior study.  Clear lungs.  Original Report Authenticated By: Gwynn Burly,  M.D.    Physical Exam:  patient is intubated he is not on any sedation. His is comatose and unresponsive. Pupils are 3 mm sluggishly reactive. Doll's eye movements are present There are few spontaneous side to side roving eye movements now.. He has slight left gaze deviation. Corneal refelexes are now easily elicitable He has a weak cough and gag reflex. There are no spontaneous extremity movements noted. He has minimal  wthdrawal of all extremities to pain today. There is no withdrawal in the upper extremities. There is minimal withdrawal in both lower extremities to pain. Both plantars are upgoing.    ASSESSMENT [name] with a left-sided weakness and dizziness due to a right hemispheric right brain cardioembolic infarct secondary to atrial fibrillation. On admission, suboptimal anticoagulation on Coumadin with INR of 1.7. Pulmonary edema and respiratory failure led to intubation. Patient treated with IV heparin. Follow-up CT scan shows hemorrhagic transformation in the large right cerebellar infarct with brainstem compression, hydrocephalus and cerebral edema. Anticoagulants stopped. He was placed on 3% NS to decrease cerebral edema. NA at goal today on 3% saline. Today his exam remains quiet poor. He will need trach & PEG for long-term survival.family agrees and tracheostomy is planned for tomorrow and the tube early next week. Discussed with patient's brother as well as Dr. Molli Knock from critical care medicineThis patient is critically ill and at significant risk of neurological worsening, death and care requires constant monitoring of vital signs, hemodynamics,respiratory and cardiac monitoring, neurological assessment, discussion with family, other specialists and medical decision making of high complexity. I spent 30 minutes of neurocritical care time  in the care of  this patient.    Hospital day # 8  TREATMENT/PLAN Plan trach on Fri. By Dr. Pollyann Kennedy. And a PEG on Monday. Increase norvasc to 10mg . Set SBP goal < 180, DBP goal < 120. Defer heart rate control to PCCM.  Joaquin Music, ANP-BC, GNP-BC [facility] Pager: [phone] 11/27/2010 8:26 AM

## 2010-11-27 NOTE — Progress Notes (Signed)
ANTIBIOTIC CONSULT NOTE - FOLLOW UP  Pharmacy Consult for  Vancomycin and Zosyn - Day #3 Indication:   HCAP    No Known Allergies  Patient Measurements: Height: 5\' 9"  (175.3 cm) Weight: 215 lb 2.7 oz (97.6 kg) IBW/kg (Calculated) : 70.7    Vital Signs: Temp: 98.1 F (36.7 C) (11/15 1204) Temp src: Oral (11/15 1204) BP: 128/103 mmHg (11/15 1400) Pulse Rate: 37  (11/15 1400) Intake/Output from previous day: 11/14 0701 - 11/15 0700 In: 4118.8 [I.V.:1608.8; NG/GT:1735; IV Piggyback:775] Out: 4684 [Urine:4410; Emesis/NG output:10; Drains:264] Intake/Output from this shift: Total I/O In: 1224.5 [I.V.:560; NG/GT:435; IV Piggyback:229.5] Out: 1512 [Urine:1450; Drains:62]  Labs:  [facility] 11/27/10 0425 11/26/10 0410 11/25/10 0500  WBC 8.8 -- 8.1  HGB 11.9* -- 12.0*  PLT 271 -- 267  LABCREA -- -- --  CREATININE 1.17 1.41* 1.45*   Estimated Creatinine Clearance: 81.3 ml/min (by C-G formula based on Cr of 1.17). No results found for this basename: VANCOTROUGH:2,VANCOPEAK:2,VANCORANDOM:2,GENTTROUGH:2,GENTPEAK:2,GENTRANDOM:2,TOBRATROUGH:2,TOBRAPEAK:2,TOBRARND:2,AMIKACINPEAK:2,AMIKACINTROU:2,AMIKACIN:2, in the last 72 hours   Microbiology: Recent Results (from the past 720 hour(s))  MRSA PCR SCREENING     Status: Normal   Collection Time   11/19/10  9:04 PM      Component Value Range Status Comment   MRSA by PCR NEGATIVE  NEGATIVE  Final   CULTURE, RESPIRATORY     Status: Normal   Collection Time   11/20/10  9:01 AM      Component Value Range Status Comment   Specimen Description TRACHEAL ASPIRATE   Final    Special Requests NONE   Final    Gram Stain     Final    Value: RARE WBC PRESENT,BOTH PMN AND MONONUCLEAR     NO SQUAMOUS EPITHELIAL CELLS SEEN     FEW GRAM POSITIVE COCCI     IN PAIRS RARE GRAM POSITIVE RODS     RARE GRAM NEGATIVE COCCI   Culture     Final    Value: MODERATE STAPHYLOCOCCUS AUREUS     Note: RIFAMPIN AND GENTAMICIN SHOULD NOT BE USED AS SINGLE DRUGS FOR  TREATMENT OF STAPH INFECTIONS.   Report Status 11/23/2010 FINAL   Final    Organism ID, Bacteria STAPHYLOCOCCUS AUREUS   Final   CULTURE, BLOOD (ROUTINE X 2)     Status: Normal   Collection Time   11/20/10 10:00 AM      Component Value Range Status Comment   Specimen Description BLOOD ARM LEFT   Final    Special Requests BOTTLES DRAWN AEROBIC AND ANAEROBIC [facility]   Final    Setup Time [id number]   Final    Culture NO GROWTH 5 DAYS   Final    Report Status 11/26/2010 FINAL   Final   CULTURE, BLOOD (ROUTINE X 2)     Status: Normal   Collection Time   11/20/10 10:20 AM      Component Value Range Status Comment   Specimen Description BLOOD HAND LEFT   Final    Special Requests BOTTLES DRAWN AEROBIC ONLY 5.0 CC   Final    Setup Time [id number]   Final    Culture NO GROWTH 5 DAYS   Final    Report Status 11/26/2010 FINAL   Final   CULTURE, BLOOD (ROUTINE X 2)     Status: Normal (Preliminary result)   Collection Time   11/24/10 11:45 AM      Component Value Range Status Comment   Specimen Description BLOOD LEFT ARM   Final  Special Requests BOTTLES DRAWN AEROBIC AND ANAEROBIC 10CC   Final    Setup Time [id number]   Final    Culture     Final    Value:        BLOOD CULTURE RECEIVED NO GROWTH TO DATE CULTURE WILL BE HELD FOR 5 DAYS BEFORE ISSUING A FINAL NEGATIVE REPORT   Report Status PENDING   Incomplete   CULTURE, BLOOD (ROUTINE X 2)     Status: Normal (Preliminary result)   Collection Time   11/24/10 12:00 PM      Component Value Range Status Comment   Specimen Description BLOOD LEFT ARM   Final    Special Requests BOTTLES DRAWN AEROBIC AND ANAEROBIC 10CC   Final    Setup Time [id number]   Final    Culture     Final    Value:        BLOOD CULTURE RECEIVED NO GROWTH TO DATE CULTURE WILL BE HELD FOR 5 DAYS BEFORE ISSUING A FINAL NEGATIVE REPORT   Report Status PENDING   Incomplete   URINE CULTURE     Status: Normal   Collection Time   11/24/10  1:41 PM      Component  Value Range Status Comment   Specimen Description URINE, CLEAN CATCH   Final    Special Requests NONE   Final    Setup Time [id number]   Final    Colony Count NO GROWTH   Final    Culture NO GROWTH   Final    Report Status 11/25/2010 FINAL   Final   CULTURE, RESPIRATORY     Status: Normal   Collection Time   11/24/10  5:15 PM      Component Value Range Status Comment   Specimen Description TRACHEAL ASPIRATE   Final    Special Requests NONE   Final    Gram Stain     Final    Value: ABUNDANT WBC PRESENT, PREDOMINANTLY PMN     FEW SQUAMOUS EPITHELIAL CELLS PRESENT     RARE GRAM POSITIVE COCCI IN PAIRS   Culture Non-Pathogenic Oropharyngeal-type Flora Isolated.   Final    Report Status 11/27/2010 FINAL   Final     Assessment:     Continues on Vanc 1250 mg IV q12hrs and Zosyn 3.375 gm IV q8 hrs (each over 4 hours).  MSSA in 11/8 respiratory culture;  11/12 culture with non-pathogenic flora. Urine culture negative.  BUN/Cr improved, est crcl ~80 ml/min  Goal of Therapy:     Vancomycin troughs 15-20 mcg/ml    Appropriate Zosyn dose for renal functio and infection.  Plan:     Will continue current regimen.    Will consider checking Vanc trough level if > 5-7 days of therapy.  Dennie Fetters 11/27/2010,2:55 PM

## 2010-11-28 ENCOUNTER — Inpatient Hospital Stay (HOSPITAL_COMMUNITY): Payer: 59

## 2010-11-28 ENCOUNTER — Encounter (HOSPITAL_COMMUNITY): Payer: Self-pay | Admitting: Critical Care Medicine

## 2010-11-28 ENCOUNTER — Inpatient Hospital Stay (HOSPITAL_COMMUNITY): Payer: 59 | Admitting: Critical Care Medicine

## 2010-11-28 ENCOUNTER — Encounter (HOSPITAL_COMMUNITY)
Admission: EM | Disposition: A | Discharge status: Rehabilitation Facility | Payer: Self-pay | Source: Ambulatory Visit | Attending: Pulmonary Disease

## 2010-11-28 HISTORY — PX: TRACHEOSTOMY TUBE PLACEMENT: SHX814

## 2010-11-28 LAB — BASIC METABOLIC PANEL
CO2: 28 mEq/L (ref 19–32)
Chloride: 125 mEq/L — ABNORMAL HIGH (ref 96–112)
GFR calc Af Amer: 89 mL/min — ABNORMAL LOW (ref >90)
Potassium: 3.7 mEq/L (ref 3.5–5.1)
Sodium: 159 mEq/L — ABNORMAL HIGH (ref 135–145)

## 2010-11-28 LAB — CBC
Platelets: 277 10*3/uL (ref 150–400)
RBC: 4.13 MIL/uL — ABNORMAL LOW (ref 4.22–5.81)
RDW: 15.3 % (ref 11.5–15.5)
WBC: 9.7 10*3/uL (ref 4.0–10.5)

## 2010-11-28 LAB — GLUCOSE, CAPILLARY
Glucose-Capillary: 129 mg/dL — ABNORMAL HIGH (ref 70–99)
Glucose-Capillary: 156 mg/dL — ABNORMAL HIGH (ref 70–99)
Glucose-Capillary: 171 mg/dL — ABNORMAL HIGH (ref 70–99)
Glucose-Capillary: 216 mg/dL — ABNORMAL HIGH (ref 70–99)

## 2010-11-28 LAB — BLOOD GAS, ARTERIAL
Acid-Base Excess: 1.9 mmol/L (ref 0.0–2.0)
Bicarbonate: 26 mEq/L — ABNORMAL HIGH (ref 20.0–24.0)
MECHVT: 500 mL
TCO2: 27.2 mmol/L (ref 0–100)
pCO2 arterial: 41.1 mmHg (ref 35.0–45.0)
pO2, Arterial: 125 mmHg — ABNORMAL HIGH (ref 80.0–100.0)

## 2010-11-28 LAB — SODIUM
Sodium: 157 mEq/L — ABNORMAL HIGH (ref 135–145)
Sodium: 154 mEq/L — ABNORMAL HIGH (ref 135–145)

## 2010-11-28 LAB — MAGNESIUM: Magnesium: 2.4 mg/dL (ref 1.5–2.5)

## 2010-11-28 LAB — PHOSPHORUS: Phosphorus: 2.7 mg/dL (ref 2.3–4.6)

## 2010-11-28 SURGERY — CREATION, TRACHEOSTOMY
Anesthesia: General | Site: Neck | Wound class: Clean Contaminated

## 2010-11-28 MED ORDER — ROSUVASTATIN CALCIUM 5 MG PO TABS
5.0000 mg | ORAL_TABLET | Freq: Every day | ORAL | Status: DC
  Administered 2010-11-28 – 2010-12-20 (×22): 5 mg via ORAL
  Filled 2010-11-28 (×25): qty 1

## 2010-11-28 MED ORDER — LIDOCAINE-EPINEPHRINE (PF) 1 %-1:200000 IJ SOLN
INTRAMUSCULAR | Status: DC | PRN
  Administered 2010-11-28: 30 mL

## 2010-11-28 MED ORDER — DEXTROSE 5 % IV SOLN
30.0000 mmol | Freq: Once | INTRAVENOUS | Status: AC
  Administered 2010-11-28: 30 mmol via INTRAVENOUS
  Filled 2010-11-28: qty 10

## 2010-11-28 MED ORDER — LACTATED RINGERS IV SOLN
INTRAVENOUS | Status: DC | PRN
  Administered 2010-11-28: 08:00:00 via INTRAVENOUS

## 2010-11-28 MED ORDER — ROCURONIUM BROMIDE 100 MG/10ML IV SOLN
INTRAVENOUS | Status: DC | PRN
  Administered 2010-11-28: 30 mg via INTRAVENOUS

## 2010-11-28 MED ORDER — FENTANYL CITRATE 0.05 MG/ML IJ SOLN
INTRAMUSCULAR | Status: DC | PRN
  Administered 2010-11-28: 100 ug via INTRAVENOUS

## 2010-11-28 MED ORDER — POTASSIUM CHLORIDE 20 MEQ/15ML (10%) PO LIQD: 40.0000 meq | Freq: Three times a day (TID) | ORAL | Status: DC

## 2010-11-28 MED ORDER — FUROSEMIDE 10 MG/ML IJ SOLN: 40.0000 mg | Freq: Four times a day (QID) | INTRAMUSCULAR | Status: DC

## 2010-11-28 SURGICAL SUPPLY — 37 items
APL SKNCLS STERI-STRIP NONHPOA (GAUZE/BANDAGES/DRESSINGS)
BENZOIN TINCTURE PRP APPL 2/3 (GAUZE/BANDAGES/DRESSINGS) IMPLANT
BLADE SURG 11 STRL SS (BLADE) IMPLANT
BLADE SURG 15 STRL LF DISP TIS (BLADE) ×1 IMPLANT
BLADE SURG 15 STRL SS (BLADE) ×2
BLADE SURG ROTATE 9660 (MISCELLANEOUS) IMPLANT
CANISTER SUCTION 2500CC (MISCELLANEOUS) ×2 IMPLANT
CLEANER TIP ELECTROSURG 2X2 (MISCELLANEOUS) ×2 IMPLANT
CLOTH BEACON ORANGE TIMEOUT ST (SAFETY) ×2 IMPLANT
COVER SURGICAL LIGHT HANDLE (MISCELLANEOUS) ×2 IMPLANT
DECANTER SPIKE VIAL GLASS SM (MISCELLANEOUS) ×2 IMPLANT
ELECT COATED BLADE 2.86 ST (ELECTRODE) ×2 IMPLANT
ELECT REM PT RETURN 9FT ADLT (ELECTROSURGICAL) ×2
ELECTRODE REM PT RTRN 9FT ADLT (ELECTROSURGICAL) ×1 IMPLANT
GAUZE SPONGE 4X4 16PLY XRAY LF (GAUZE/BANDAGES/DRESSINGS) ×2 IMPLANT
GLOVE ECLIPSE 7.5 STRL STRAW (GLOVE) ×2 IMPLANT
GLOVE SURG SS PI 7.0 STRL IVOR (GLOVE) ×1 IMPLANT
GOWN STRL NON-REIN LRG LVL3 (GOWN DISPOSABLE) ×4 IMPLANT
KIT BASIN OR (CUSTOM PROCEDURE TRAY) ×2 IMPLANT
KIT ROOM TURNOVER OR (KITS) ×2 IMPLANT
NDL HYPO 25GX1X1/2 BEV (NEEDLE) IMPLANT
NEEDLE HYPO 25GX1X1/2 BEV (NEEDLE) IMPLANT
NS IRRIG 1000ML POUR BTL (IV SOLUTION) ×2 IMPLANT
PACK EENT II TURBAN DRAPE (CUSTOM PROCEDURE TRAY) ×2 IMPLANT
PAD ARMBOARD 7.5X6 YLW CONV (MISCELLANEOUS) ×2 IMPLANT
PENCIL FOOT CONTROL (ELECTRODE) ×2 IMPLANT
SUT CHROMIC 2 0 SH (SUTURE) ×2 IMPLANT
SUT ETHILON 3 0 PS 1 (SUTURE) ×2 IMPLANT
SUT SILK 4 0 TIE 10X30 (SUTURE) ×2 IMPLANT
SUT SILK 4 0 TIES 17X18 (SUTURE) ×2 IMPLANT
SYR 20CC LL (SYRINGE) ×2 IMPLANT
SYR CONTROL 10ML LL (SYRINGE) IMPLANT
TOWEL OR 17X24 6PK STRL BLUE (TOWEL DISPOSABLE) ×2 IMPLANT
TOWEL OR 17X26 10 PK STRL BLUE (TOWEL DISPOSABLE) ×2 IMPLANT
TUBE CONNECTING 12X1/4 (SUCTIONS) ×2 IMPLANT
TUBE TRACH SHILEY 8 DIST CUF (TUBING) ×1 IMPLANT
WATER STERILE IRR 1000ML POUR (IV SOLUTION) ×2 IMPLANT

## 2010-11-28 NOTE — Transfer of Care (Signed)
Immediate Anesthesia Transfer of Care Note  Patient: [name]  Procedure(s) Performed:  TRACHEOSTOMY  Patient Location: ICU  Anesthesia Type: General  Level of Consciousness: unresponsive  Airway & Oxygen Therapy: Patient placed on Ventilator (see vital sign flow sheet for setting)  Post-op Assessment: Post -op Vital signs reviewed and stable  Post vital signs: Reviewed and stable  Complications: No apparent anesthesia complications

## 2010-11-28 NOTE — Progress Notes (Addendum)
Pt back from OR

## 2010-11-28 NOTE — Progress Notes (Signed)
Late entry  Contacted Dr. Emeline Darling regarding the patient's NPO status prior to procedure in AM.  Dr. Emeline Darling stated that the patient is NPO except for medications.  No new orders were given.  Will continue to monitor.  [name] 11/28/2010 3:25 AM

## 2010-11-28 NOTE — Progress Notes (Addendum)
Pt taken to OR.

## 2010-11-28 NOTE — Anesthesia Postprocedure Evaluation (Signed)
  Anesthesia Post-op Note  Patient: [name]  Procedure(s) Performed:  TRACHEOSTOMY  Patient Location: PACU and ICU  Anesthesia Type: General  Level of Consciousness: unresponsive  Airway and Oxygen Therapy: Patient placed on Ventilator (see vital sign flow sheet for setting)  Post-op Pain: none  Post-op Assessment: Post-op Vital signs reviewed, Patient's Cardiovascular Status Stable, Respiratory Function Stable and Patent Airway  Post-op Vital Signs: Reviewed and stable  Complications: No apparent anesthesia complications

## 2010-11-28 NOTE — Op Note (Signed)
11/28/2010  8:24 AM  PATIENT:  [name]  55 y.o. male  PRE-OPERATIVE DIAGNOSIS:  respiratory failure, chronic ventilator dependent  POST-OPERATIVE DIAGNOSIS:  respiratory failure, chronic ventilator dependent  PROCEDURE:  Procedure(s): TRACHEOSTOMY  SURGEON:  Surgeon(s): Susy Frizzle, MD  ASSISTANTS: none   ANESTHESIA:   general  EBL: Minimal    Total I/O In: -  Out: 125 [Urine:125]  BLOOD ADMINISTERED:none  DRAINS: none   LOCAL MEDICATIONS USED:  NONE  COUNTS:  YES  DICTATION: . Patient was taken to the operating room and placed on the operating table in the supine position. A shoulder roll was placed for positioning. The patient was previously orally intubated. The neck was prepped and draped in a standard fashion. A vertical incision was created just above the sternal notch using electrocautery. The midline fascia was divided. The isthmus of the thyroid was reflected superiorly and the upper trachea was exposed. A tracheotomy was created between the second and third tracheal rings in a horizontal fashion. A lower tracheal flap was created with scissors and the flap was sutured to the cervical skin using 2-0 chromic suture. The orotracheal tube was removed. The #8 Shiley tracheostomy tube was placed without difficulty and the cuff was inflated. The shield was secured to the neck using a Velcro straps and nylon suture. The patient was then transferred back to the intensive care unit in critical condition.  PLAN OF CARE: Transfer to ICU  PATIENT DISPOSITION:  ICU- hemodynamically stable.   Delay start of Pharmacological VTE agent (>24hrs) due to surgical blood loss or risk of bleeding:  no

## 2010-11-28 NOTE — Progress Notes (Signed)
Chaplain Note:  Chaplain visited with pt and family.  Pt was not able to interact during this visit.  Chaplain provided spiritual comfort, support and prayer for pt's brothers.  Brothers are leaning on faith but their way of putting trust in God is by doing everything possible to prolong pt's physical existence.  They hope for miraculous care.  In conversation, there was no sign of their being prepared to move toward considering the possibility of pt's death.  Family is tired in body and spirit.  Chaplain will continue to follow.   11/28/10 1147  Clinical Encounter Type  Visited With Patient and family together  Visit Type Follow-up;Spiritual support  Spiritual Encounters  Spiritual Needs Emotional;Prayer  Stress Factors  Patient Stress Factors Major life changes;Loss of control (Pt in coma,)  Family Stress Factors Loss of control;Family relationships;Exhausted (Family not realistic about pt's potential for recovery.)    Verdie Shire, chaplain resident [phone])

## 2010-11-28 NOTE — Preoperative (Signed)
Beta Blockers   Reason not to administer Beta Blockers:Not Applicable 

## 2010-11-28 NOTE — Progress Notes (Signed)
Stroke Team Progress Note  SUBJECTIVE [name] at bedside. Pt just back from having trach place in OR this am.doing well after trach is tolerating trach collar so far. Neurological exam not much change. Heart rate and blood pressure are improved after adding Cardizem yesterday  OBJECTIVE Most recent Vital Signs: Temp: 98.3 F (36.8 C) (11/16 0000) Temp src: Axillary (11/16 0000) BP: 133/106 mmHg (11/16 0700) Pulse Rate: 71  (11/16 0700) Respiratory Rate: 16 O2 Saturdation: 100%  CBG (last 3)   Basename 11/28/10 0356 11/28/10 0045 11/27/10 1947  GLUCAP 129* 171* 204*    Intake/Output from previous day: 11/15 0701 - 11/16 0700 In: 4282.3 [I.V.:2108.3; NG/GT:1345; IV Piggyback:829] Out: 3508 [Urine:3335; Drains:173]   IV Fluid Intake:     . feeding supplement (PIVOT 1.5 CAL) 1,000 mL (11/27/10 1227)  . niCARDipine 5 mg/hr (11/28/10 0550)  . sodium chloride 60 mL/hr (11/28/10 0731)   Diet: NPO   Activity:  Bedrest  DVT Prophylaxis:  Lovenox 40 mg sq daily, SCDs   Studies: Results for orders placed during the hospital encounter of 11/19/10 (from the past 24 hour(s))  GLUCOSE, CAPILLARY     Status: Abnormal   Collection Time   11/27/10 12:01 PM      Component Value Range   Glucose-Capillary 198 (*) 70 - 99 (mg/dL)  SODIUM     Status: Abnormal   Collection Time   11/27/10 12:29 PM      Component Value Range   Sodium 156 (*) 135 - 145 (mEq/L)  GLUCOSE, CAPILLARY     Status: Abnormal   Collection Time   11/27/10  4:36 PM      Component Value Range   Glucose-Capillary 202 (*) 70 - 99 (mg/dL)  SODIUM     Status: Abnormal   Collection Time   11/27/10  5:25 PM      Component Value Range   Sodium 158 (*) 135 - 145 (mEq/L)  GLUCOSE, CAPILLARY     Status: Abnormal   Collection Time   11/27/10  7:47 PM      Component Value Range   Glucose-Capillary 204 (*) 70 - 99 (mg/dL)  BASIC METABOLIC PANEL     Status: Abnormal   Collection Time   11/27/10  8:00 PM      Component  Value Range   Sodium 155 (*) 135 - 145 (mEq/L)   Potassium 3.8  3.5 - 5.1 (mEq/L)   Chloride 121 (*) 96 - 112 (mEq/L)   CO2 26  19 - 32 (mEq/L)   Glucose, Bld 197 (*) 70 - 99 (mg/dL)   BUN 33 (*) 6 - 23 (mg/dL)   Creatinine, Ser 1.61  0.50 - 1.35 (mg/dL)   Calcium 09.6  8.4 - 10.5 (mg/dL)   GFR calc non Af Amer 66 (*) >90 (mL/min)   GFR calc Af Amer 76 (*) >90 (mL/min)  CBC     Status: Abnormal   Collection Time   11/27/10  8:00 PM      Component Value Range   WBC 8.6  4.0 - 10.5 (K/uL)   RBC 4.14 (*) 4.22 - 5.81 (MIL/uL)   Hemoglobin 12.2 (*) 13.0 - 17.0 (g/dL)   HCT 04.5 (*) 40.9 - 52.0 (%)   MCV 93.5  78.0 - 100.0 (fL)   MCH 29.5  26.0 - 34.0 (pg)   MCHC 31.5  30.0 - 36.0 (g/dL)   RDW 81.1  91.4 - 78.2 (%)   Platelets 274  150 - 400 (K/uL)  TYPE  AND SCREEN     Status: Normal   Collection Time   11/27/10  9:17 PM      Component Value Range   ABO/RH(D) A POS     Antibody Screen NEG     Sample Expiration 11/30/2010    SODIUM     Status: Abnormal   Collection Time   11/28/10 12:00 AM      Component Value Range   Sodium 157 (*) 135 - 145 (mEq/L)  GLUCOSE, CAPILLARY     Status: Abnormal   Collection Time   11/28/10 12:45 AM      Component Value Range   Glucose-Capillary 171 (*) 70 - 99 (mg/dL)  BLOOD GAS, ARTERIAL     Status: Abnormal   Collection Time   11/28/10  3:56 AM      Component Value Range   FIO2 .30     Mode PRESSURE REGULATED VOLUME CONTROL     VT 500     Rate 16     Peep/cpap 5.0     pH, Arterial 7.417  7.350 - 7.450    pCO2 arterial 41.1  35.0 - 45.0 (mmHg)   pO2, Arterial 125.0 (*) 80.0 - 100.0 (mmHg)   Bicarbonate 26.0 (*) 20.0 - 24.0 (mEq/L)   TCO2 27.2  0 - 100 (mmol/L)   Acid-Base Excess 1.9  0.0 - 2.0 (mmol/L)   O2 Saturation 99.0     Patient temperature 98.6     Allens test (pass/fail) PASS  PASS   GLUCOSE, CAPILLARY     Status: Abnormal   Collection Time   11/28/10  3:56 AM      Component Value Range   Glucose-Capillary 129 (*) 70 - 99  (mg/dL)  CBC     Status: Abnormal   Collection Time   11/28/10  6:00 AM      Component Value Range   WBC 9.7  4.0 - 10.5 (K/uL)   RBC 4.13 (*) 4.22 - 5.81 (MIL/uL)   Hemoglobin 12.3 (*) 13.0 - 17.0 (g/dL)   HCT 16.1 (*) 09.6 - 52.0 (%)   MCV 93.9  78.0 - 100.0 (fL)   MCH 29.8  26.0 - 34.0 (pg)   MCHC 31.7  30.0 - 36.0 (g/dL)   RDW 04.5  40.9 - 81.1 (%)   Platelets 277  150 - 400 (K/uL)  BASIC METABOLIC PANEL     Status: Abnormal   Collection Time   11/28/10  6:00 AM      Component Value Range   Sodium 159 (*) 135 - 145 (mEq/L)   Potassium 3.7  3.5 - 5.1 (mEq/L)   Chloride 125 (*) 96 - 112 (mEq/L)   CO2 28  19 - 32 (mEq/L)   Glucose, Bld 152 (*) 70 - 99 (mg/dL)   BUN 29 (*) 6 - 23 (mg/dL)   Creatinine, Ser 9.14  0.50 - 1.35 (mg/dL)   Calcium 9.9  8.4 - 78.2 (mg/dL)   GFR calc non Af Amer 77 (*) >90 (mL/min)   GFR calc Af Amer 89 (*) >90 (mL/min)  MAGNESIUM     Status: Normal   Collection Time   11/28/10  6:00 AM      Component Value Range   Magnesium 2.4  1.5 - 2.5 (mg/dL)  PHOSPHORUS     Status: Normal   Collection Time   11/28/10  6:00 AM      Component Value Range   Phosphorus 2.7  2.3 - 4.6 (mg/dL)     Dg  Chest Port 1 View  11/28/2010  *RADIOLOGY REPORT*  Clinical Data: Assess endotracheal tube position  PORTABLE CHEST - 1 VIEW  Comparison: 11/27/2010  Findings: The endotracheal tube is located 7.3 cm above the level of the carina.  A right peripheral CVP remains stable.  A feeding tube is in place with the tip not visualized on this exam.  Heart and mediastinal contours are stable.  Persistent left lower lobe volume loss is noted.  The lung fields are otherwise clear with no signs of focal infiltrate or congestive failure suggested.  IMPRESSION: Stable cardiopulmonary appearance with persistent left lower lobe atelectasis and no new worrisome findings evident.  Original Report Authenticated By: Bertha Stakes, M.D.   Dg Chest Port 1 View  11/27/2010  *RADIOLOGY  REPORT*  Clinical Data: Evaluate ET tube placement  PORTABLE CHEST - 1 VIEW  Comparison: 11/26/2010  Findings: The ET tube tip is above the carina.  There is a feeding tube with tip in the stomach.  Right arm PICC line tip is in the SVC.  Stable cardiac enlargement.  Diminished aeration to the left lung base is identified.  Unchanged from previous exam.  IMPRESSION:  1. Decreased aeration to the left base, similar to previous exam. 2.  Stable support apparatus.  Original Report Authenticated By: Rosealee Albee, M.D.    Physical Exam:  patient is intubated he is not on any sedation. His is comatose and unresponsive. Pupils are 3 mm sluggishly reactive. Doll's eye movements are present . He has spontaneous side to side movements today. Marland Kitchen He has slight left gaze deviation. Corneal refelexes are now easily elicitable He has a weak cough and gag reflex. There are no spontaneous extremity movements noted. He has minimal wthdrawal of all extremities to pain today. There is no withdrawal in the upper extremities. There is minimal withdrawal in both lower extremities to pain. Both plantars are upgoing.   Cardiac exam rapid heart sounds no murmurs. Lungs are clear to auscultation.  ASSESSMENT [name] with a left-sided weakness and dizziness due to a right hemispheric right brain cardioembolic infarct secondary to atrial fibrillation. On admission, suboptimal anticoagulation on Coumadin with INR of 1.7. Pulmonary edema and respiratory failure led to intubation. Patient treated with IV heparin. Follow-up CT scan shows hemorrhagic transformation in the large right cerebellar infarct with brainstem compression, hydrocephalus and cerebral edema. Anticoagulants stopped. He was placed on 3% NS to decrease cerebral edema. NA at 159 today on 3% saline - will stop 3%. He had trach placed this am.    Discussed with patient's [name] as well as Dr. Molli Knock from critical care medicineThis patient is  critically ill and at significant risk of neurological worsening, death and care requires constant monitoring of vital signs, hemodynamics,respiratory and cardiac monitoring, neurological assessment, discussion with family, other specialists and medical decision making of high complexity. I spent 30 minutes of neurocritical care time in the care of this patient.   Hospital day # 9  TREATMENT/PLAN PEG Tues. Stop 3%. Keep checking NA levels. Do not plan to resume 3%. Will re-evaluate Monday. Likely d/c IVC next week.  Joaquin Music, ANP-BC, GNP-BC [facility] Pager: [phone] 11/28/2010 8:41 AM  Dr. Delia Heady, Stroke Center Medical Director, has personally reviewed chart, pertinent data, examined the patient and developed the plan of care.

## 2010-11-28 NOTE — Anesthesia Preprocedure Evaluation (Addendum)
Anesthesia Evaluation  Patient identified by MRN, date of birth, ID band Patient unresponsive    Reviewed: Allergy & Precautions, H&P , NPO status , Patient's Chart, lab work & pertinent test results, Unable to perform ROS - Chart review only  Airway       Dental   Pulmonary pneumonia ,          Cardiovascular hypertension, + CAD and +CHF + dysrhythmias Atrial Fibrillation Irregular Abnormal    Neuro/Psych CVA    GI/Hepatic   Endo/Other  Diabetes mellitus-  Renal/GU      Musculoskeletal   Abdominal   Peds  Hematology   Anesthesia Other Findings Patient is already intubated  Reproductive/Obstetrics                          Anesthesia Physical Anesthesia Plan  ASA: IV  Anesthesia Plan: General   Post-op Pain Management:    Induction:   Airway Management Planned: Oral ETT  Additional Equipment:   Intra-op Plan:   Post-operative Plan: Post-operative intubation/ventilation  Informed Consent: I have reviewed the patients History and Physical, chart, labs and discussed the procedure including the risks, benefits and alternatives for the proposed anesthesia with the patient or authorized representative who has indicated his/her understanding and acceptance.     Plan Discussed with: CRNA and Surgeon  Anesthesia Plan Comments:         Anesthesia Quick Evaluation

## 2010-11-28 NOTE — Progress Notes (Signed)
[name] is a 55 y.o. male former smoker admitted on 11/19/2010 with acute onset of slurred speech and Lt sided weakness.  Intubated in ED for airway protection and pulmonary edema.  Had spontaneous resolution of neuro symptoms. PMHx DM, CAD, CHF, HTN, Gout, A fib.  Line/tubes: ETT 11/7>>11/9, 11/10 (cerebellar hge) >> Ventric 11/10 (dr Sherlon Handing emergent) > R  TLC 11/7>>>  Cx: Blood 11/8>>NTD Sputum 11/8>>Moderate Staph aureus. Blood 11/12>>>NTD Urine 11/12>>>NTD Sputum 11/12: abundant GPC pairs/abundant WBC>>>  Abx: Rocephin 11/8>>11/12 Zithromax 11/8>>11/12 Zosyn 11/12>>> Vancomycin 11/12>>>11/16  Best practice: Pepcid Heparin gtt/coumadin for A fib >>  stopped 11/19   Consults: Neuro Cardiology [name])  Tests/events: 11/7: CT head>>negative 11/7: CT chest>>b/l GGO more at bases, small b/l effusions, no PE, dependent ATX 11/8: MRI head w/o contrast>>Acute/subacute non-hemorrhagic infarct Rt superior cerebellar artery territory, remote Lt cerebellar infarct, multiple punctated areas of remote hemorrhage compatible with amyloid angiopathy 11/8: Echo>>severe LVH, EF 25 to 35%, mild AR, mild MR, PAS 11/10 - CT head am: marked hgic transformation of rt cerebaellar infarct -> supratentorial hydrocephalus -> ventric drain 11/10 - GCS 5 then reintubated.  SUBJECTIVE: No events overnight, trach today.  OBJECTIVE:  Temp:  [96.8 F (36 C)-98.9 F (37.2 C)] 96.8 F (36 C) (11/16 0846) Pulse Rate:  [25-105] 103  (11/16 0917) Resp:  [11-23] 16  (11/16 0917) BP: (112-195)/(78-148) 133/115 mmHg (11/16 0900) SpO2:  [98 %-100 %] 100 % (11/16 0900) FiO2 (%):  [28 %-100 %] 28 % (11/16 0917) Weight:  [97.2 kg (214 lb 4.6 oz)] 214 lb 4.6 oz (97.2 kg) (11/16 0441)   Intake/Output Summary (Last 24 hours) at 11/28/10 1039 Last data filed at 11/28/10 0900  Gross per 24 hour  Intake 3839.83 ml  Output   3681 ml  Net 158.83 ml   General - obese, GCS 3 -5 ENT: - Pupils  reactive Cardiac - irregularly irregular Chest - Coarse BS bilaterally Abd - soft, non-tender, ND and +BS Ext - no edema but some bruises in left arm and forearm ? Related to high INR and bp cuff: stable since 11/10 Neuro - GCS 5. Gag +. Doing PSV.   Lab Results  Component Value Date   CREATININE 1.06 11/28/2010   BUN 29* 11/28/2010   NA 159* 11/28/2010   K 3.7 11/28/2010   CL 125* 11/28/2010   CO2 28 11/28/2010   CBC    Component Value Date/Time   WBC 9.7 11/28/2010 0600   RBC 4.13* 11/28/2010 0600   HGB 12.3* 11/28/2010 0600   HCT 38.8* 11/28/2010 0600   PLT 277 11/28/2010 0600   MCV 93.9 11/28/2010 0600   MCH 29.8 11/28/2010 0600   MCHC 31.7 11/28/2010 0600   RDW 15.3 11/28/2010 0600   LYMPHSABS 5.0* 11/19/2010 1715   MONOABS 0.7 11/19/2010 1715   EOSABS 0.3 11/19/2010 1715   BASOSABS 0.0 11/19/2010 1715   Lab Results  Component Value Date   TSH 0.895 11/20/2010   FreeT4>>1.82 11/20/2010   ASSESSMENT/PLAN:  1) Cerebellar Ischemic infract with ? Hemorrhage transformation/Coma, now persistent vegetative state.  Still hypertonic saline per neuro. Prognosis remains very poor.  No reasonable chance are recovery from a neurologic standpoint.  Not a VP shunt candidate, unclear what will happen once ventriculostomy is out. Plan:   - Per neuro.  2) Acute resp failure: Due to inability to protect airway. PLAN - Place on TC as tolerated. - ENT to trach today. - Unlikely to require trach post vent.  3) A. Fib: Heparin stopped following cerebellar hemorrhage 11/21/10. PLAN - Monitor.  4) Hypertension: remains on cardene. PLAN: - PRN fentanyl for pain - Increased Norvasc to 10 mg po daily and hopefully titrate cardene to off today.  5) h/o CAD Plan: - Monitor  6) H/O Gout Plan: - Will need to resume allopurinol, colchicine when able to take oral medication.  7) Right lower lobe airspace, possible pneumonia (nos to date). Plan: - HCAP coverage. - D/Ced  vancomycin.  8) Thyroiditis Plan: - CT images d/w radiology with concerns for thyroid inflammation - F/U mild elevation in FT4, and low normal TSH>>will monitor for now  9) Acute renal failure, slight worsening of renal failure Plan: - Keep euvolemic. - Monitor BMP.  10) Hypernatremia. Slightly worse, on triple H therapy..  Plan: - No diuresis. - Per neuro.  11) Hyperglycemia Plan: - SSI, increase lantus  Critical care time 35 minutes.  Cristin Penaflor 11/28/2010, 10:39 AM

## 2010-11-28 NOTE — Progress Notes (Signed)
CSW spoke with pt niece by phone, who requests a letter from the hospital stating pt condition for purposes of managing pt accounts. CSW will provide requested letter and CSW will continue to follow for other psychosocial needs and potential d/c planning, as appropriate to pt condition and prognosis.

## 2010-11-28 NOTE — Progress Notes (Signed)
Intubated, comatose  Just returning from trach - per nurse - no chang in neuro exam prior to procedure - pt unresponsive  ventric draining at 18 cm - 173 cc yest(thats decreased)  Temp:  [96.8 F (36 C)-98.9 F (37.2 C)] 96.8 F (36 C) (11/16 0846) Pulse Rate:  [25-109] 96  (11/16 0846) Resp:  [11-23] 16  (11/16 0846) BP: (112-195)/(78-148) 121/98 mmHg (11/16 0846) SpO2:  [98 %-100 %] 100 % (11/16 0846) FiO2 (%):  [29.8 %-100 %] 100 % (11/16 0846) Weight:  [97.2 kg (214 lb 4.6 oz)] 214 lb 4.6 oz (97.2 kg) (11/16 0441)   Plan: Poor prognosis - wean ventric

## 2010-11-28 NOTE — Progress Notes (Signed)
Contacted IV team regarding administering tPA in the patient's occluded proximal port on his central line.  Was told that if the port won't flush, they can't administer the tPA.  This is the patient's only IV access.  The remaining two ports will flush and give blood return.  Val Eagle Deanne 11/29/2010 12:03 AM

## 2010-11-29 ENCOUNTER — Inpatient Hospital Stay (HOSPITAL_COMMUNITY): Payer: 59

## 2010-11-29 ENCOUNTER — Encounter (HOSPITAL_COMMUNITY): Payer: Self-pay | Admitting: *Deleted

## 2010-11-29 LAB — PHOSPHORUS: Phosphorus: 3.3 mg/dL (ref 2.3–4.6)

## 2010-11-29 LAB — BASIC METABOLIC PANEL
CO2: 27 mEq/L (ref 19–32)
Calcium: 9.6 mg/dL (ref 8.4–10.5)
Creatinine, Ser: 1.38 mg/dL — ABNORMAL HIGH (ref 0.50–1.35)
GFR calc Af Amer: 65 mL/min — ABNORMAL LOW (ref >90)
GFR calc non Af Amer: 56 mL/min — ABNORMAL LOW (ref >90)
Sodium: 154 mEq/L — ABNORMAL HIGH (ref 135–145)

## 2010-11-29 LAB — GLUCOSE, CAPILLARY
Glucose-Capillary: 157 mg/dL — ABNORMAL HIGH (ref 70–99)
Glucose-Capillary: 162 mg/dL — ABNORMAL HIGH (ref 70–99)
Glucose-Capillary: 163 mg/dL — ABNORMAL HIGH (ref 70–99)
Glucose-Capillary: 185 mg/dL — ABNORMAL HIGH (ref 70–99)

## 2010-11-29 LAB — CBC
MCH: 29.8 pg (ref 26.0–34.0)
MCHC: 31.9 g/dL (ref 30.0–36.0)
MCV: 93.5 fL (ref 78.0–100.0)
Platelets: 278 10*3/uL (ref 150–400)
RBC: 3.99 MIL/uL — ABNORMAL LOW (ref 4.22–5.81)
RDW: 15.3 % (ref 11.5–15.5)

## 2010-11-29 LAB — BLOOD GAS, ARTERIAL
Bicarbonate: 26.9 mEq/L — ABNORMAL HIGH (ref 20.0–24.0)
PEEP: 5 cmH2O
Patient temperature: 101.1
TCO2: 28.1 mmol/L (ref 0–100)
pCO2 arterial: 42.2 mmHg (ref 35.0–45.0)
pH, Arterial: 7.427 (ref 7.350–7.450)

## 2010-11-29 LAB — SODIUM
Sodium: 154 mEq/L — ABNORMAL HIGH (ref 135–145)
Sodium: 152 mEq/L — ABNORMAL HIGH (ref 135–145)
Sodium: 152 mEq/L — ABNORMAL HIGH (ref 135–145)

## 2010-11-29 LAB — MAGNESIUM: Magnesium: 2.4 mg/dL (ref 1.5–2.5)

## 2010-11-29 MED ORDER — ACETAMINOPHEN 160 MG/5ML PO SOLN
650.0000 mg | ORAL | Status: DC | PRN
  Administered 2010-11-29 – 2010-12-12 (×9): 650 mg
  Filled 2010-11-29 (×9): qty 20.3

## 2010-11-29 MED ORDER — FUROSEMIDE 10 MG/ML IJ SOLN: 40.0000 mg | Freq: Four times a day (QID) | INTRAMUSCULAR | Status: DC

## 2010-11-29 MED ORDER — POTASSIUM CHLORIDE 20 MEQ/15ML (10%) PO LIQD: 40.0000 meq | Freq: Three times a day (TID) | ORAL | Status: DC

## 2010-11-29 MED ORDER — POTASSIUM CHLORIDE 20 MEQ/15ML (10%) PO LIQD
40.0000 meq | Freq: Three times a day (TID) | ORAL | Status: AC
  Administered 2010-11-29 (×2): 40 meq
  Filled 2010-11-29 (×2): qty 30

## 2010-11-29 NOTE — Progress Notes (Signed)
Subjective: Patient is comatose, and is unresponsive.  Objective: Vital signs in last 24 hours: Temp:  [98.6 F (37 C)-101.1 F (38.4 C)] 100.7 F (38.2 C) (11/17 0600) Pulse Rate:  [38-105] 71  (11/17 0700) Resp:  [10-21] 18  (11/17 0700) BP: (104-195)/(82-144) 151/110 mmHg (11/17 0700) SpO2:  [99 %-100 %] 100 % (11/17 0800) FiO2 (%):  [28 %-30.4 %] 28 % (11/17 0823) Weight:  [97.9 kg (215 lb 13.3 oz)] 215 lb 13.3 oz (97.9 kg) (11/17 0500) Weight change: 0.7 kg (1 lb 8.7 oz) Last BM Date: 11/28/10  Intake/Output from previous day: 11/16 0701 - 11/17 0700 In: 3539 [I.V.:580; NG/GT:1990; IV Piggyback:969] Out: 2157 [Urine:1700; Drains:182; Stool:275] Intake/Output this shift:    The patient is comatose. The patient does not respond to verbal or tactile stimulation.  Pupils are round and reactive. Minimal doll's eyes are noted.  Detected reflexes are depressed but symmetric. Toes are neutral bilaterally. Patient has minimal response to pain stimulation on all 4 extremities. Motor tone is decreased all 4 extremities. Edema seen on the arms and legs.   Lab Results:  Basename 11/29/10 0555 11/28/10 0600  WBC 10.7* 9.7  HGB 11.9* 12.3*  HCT 37.3* 38.8*  PLT 278 277   BMET  Basename 11/29/10 0555 11/29/10 11/28/10 0600  NA 154* 154* --  K 3.7 -- 3.7  CL 118* -- 125*  CO2 27 -- 28  GLUCOSE 197* -- 152*  BUN 38* -- 29*  CREATININE 1.38* -- 1.06  CALCIUM 9.6 -- 9.9    Studies/Results: Dg Chest Port 1 View  11/29/2010  *RADIOLOGY REPORT*  Clinical Data: Tracheostomy tube placement.  PORTABLE CHEST - 1 VIEW  Comparison: Chest 11/28/2010.  Findings: Endotracheal tube has been removed with a new tracheostomy tube in place.  The tip of the tube is in good position at the level of the clavicular heads.  Feeding tube and right subclavian central venous catheter are again noted.  There is cardiomegaly but no pulmonary edema.  Lungs are clear.  No pneumothorax.  IMPRESSION:   1.  New tracheostomy tube in good position. 2.  No acute disease.  Original Report Authenticated By: Bernadene Bell. Maricela Curet, M.D.   Dg Chest Port 1 View  11/28/2010  *RADIOLOGY REPORT*  Clinical Data: Assess endotracheal tube position  PORTABLE CHEST - 1 VIEW  Comparison: 11/27/2010  Findings: The endotracheal tube is located 7.3 cm above the level of the carina.  A right peripheral CVP remains stable.  A feeding tube is in place with the tip not visualized on this exam.  Heart and mediastinal contours are stable.  Persistent left lower lobe volume loss is noted.  The lung fields are otherwise clear with no signs of focal infiltrate or congestive failure suggested.  IMPRESSION: Stable cardiopulmonary appearance with persistent left lower lobe atelectasis and no new worrisome findings evident.  Original Report Authenticated By: Bertha Stakes, M.D.    Medications:  Scheduled:   . amLODipine  10 mg Oral Daily  . antiseptic oral rinse  15 mL Mouth Rinse QID  . carvedilol  25 mg Oral BID WC  . chlorhexidine  15 mL Mouth Rinse BID  . cloNIDine  0.2 mg Oral BID  . diltiazem  30 mg Per Tube Q6H  . enoxaparin (LOVENOX) injection  40 mg Subcutaneous Q24H  . famotidine  20 mg Per Tube Every 12 hours (non-specified)  . feeding supplement  30 mL Per Tube 5 X Daily  . free water  200 mL Per Tube Q6H  . insulin aspart  0-7 Units Subcutaneous Q4H  . insulin glargine  10 Units Subcutaneous BID  . isosorbide-hydrALAZINE  1 tablet Oral TID  . piperacillin-tazobactam (ZOSYN)  IV  3.375 g Intravenous Q8H  . potassium phosphate IVPB (mmol)  30 mmol Intravenous Once  . ramipril  10 mg Oral BID  . rosuvastatin  5 mg Oral q1800  . DISCONTD: furosemide  40 mg Intravenous Q6H  . DISCONTD: potassium chloride  40 mEq Per Tube TID  . DISCONTD: simvastatin  20 mg Oral q1800  . DISCONTD: vancomycin  1,250 mg Intravenous Q12H   Continuous:   . feeding supplement (PIVOT 1.5 CAL) 1,000 mL (11/29/10 0646)  .  niCARDipine 5 mg/hr (11/28/10 0550)   WUJ:WJXBJY chloride, acetaminophen (TYLENOL) oral liquid 160 mg/5 mL, fentaNYL, labetalol, metoprolol, DISCONTD: acetaminophen  Assessment/Plan: 1. Right cerebellar infarct  2. Vertebrobasilar insufficiency  3. Atrial fibrillation  The patient has been on 3% normal saline, currently off this IV therapy. The patient last had a CT scan of the brain on November 12. The patient has a VP shunt in place. Clinical examination shows minimal responsiveness on for the patient. Patient has undergone a 2-D echocardiogram with an ejection fraction of 25-30% the The patient has distal vertebral artery stenosis and basilar artery stenosis. We will plan on a repeat CT scan in a.m. Neurosurgery is following the patient.                                                                        LOS: 10 days   Endia Moncur KEITH 11/29/2010, 9:07 AM

## 2010-11-29 NOTE — Progress Notes (Signed)
[name] is a 55 y.o. male former smoker admitted on 11/19/2010 with acute onset of slurred speech and Lt sided weakness.  Intubated in ED for airway protection and pulmonary edema.  Had spontaneous resolution of neuro symptoms. PMHx DM, CAD, CHF, HTN, Gout, A fib.  Line/tubes: ETT 11/7>>11/9, 11/10 (cerebellar hge) >>11/16 Trach (ENT) 11/16>>> Ventric 11/10 (dr Sherlon Handing emergent) >> R [place] TLC 11/7>>>11/16  Cx: Blood 11/8>>NTD Sputum 11/8>>Moderate Staph aureus. Blood 11/12>>>NTD Urine 11/12>>>NTD Sputum 11/12: abundant GPC pairs/abundant WBC>>>  Abx: Rocephin 11/8>>11/12 Zithromax 11/8>>11/12 Zosyn 11/12>>> Vancomycin 11/12>>>11/16  Best practice: Pepcid Heparin gtt/coumadin for A fib >>  stopped 11/19   Consults: Neuro Cardiology [name])  Tests/events: 11/7: CT head>>negative 11/7: CT chest>>b/l GGO more at bases, small b/l effusions, no PE, dependent ATX 11/8: MRI head w/o contrast>>Acute/subacute non-hemorrhagic infarct Rt superior cerebellar artery territory, remote Lt cerebellar infarct, multiple punctated areas of remote hemorrhage compatible with amyloid angiopathy 11/8: Echo>>severe LVH, EF 25 to 35%, mild AR, mild MR, PAS 11/10 - CT head am: marked hgic transformation of rt cerebaellar infarct -> supratentorial hydrocephalus -> ventric drain 11/10 - GCS 5 then reintubated.  SUBJECTIVE: No events overnight, trach today.  OBJECTIVE:  Temp:  [98.6 F (37 C)-101.1 F (38.4 C)] 100.7 F (38.2 C) (11/17 0600) Pulse Rate:  [38-105] 71  (11/17 0700) Resp:  [10-21] 18  (11/17 0700) BP: (104-195)/(82-144) 151/110 mmHg (11/17 0700) SpO2:  [99 %-100 %] 100 % (11/17 0800) FiO2 (%):  [28 %-30.4 %] 30 % (11/17 0900) Weight:  [97.9 kg (215 lb 13.3 oz)] 215 lb 13.3 oz (97.9 kg) (11/17 0500)   Intake/Output Summary (Last 24 hours) at 11/29/10 0916 Last data filed at 11/29/10 0900  Gross per 24 hour  Intake 3366.5 ml  Output   1813 ml  Net 1553.5 ml    General - obese, GCS 3 -5 ENT: - Pupils reactive Cardiac - irregularly irregular Chest - Coarse BS bilaterally Abd - soft, non-tender, ND and +BS Ext - no edema but some bruises in left arm and forearm ? Related to high INR and bp cuff: stable since 11/10 Neuro - GCS 5. Gag +. Doing PSV.   Lab Results  Component Value Date   CREATININE 1.38* 11/29/2010   BUN 38* 11/29/2010   NA 154* 11/29/2010   K 3.7 11/29/2010   CL 118* 11/29/2010   CO2 27 11/29/2010   CBC    Component Value Date/Time   WBC 10.7* 11/29/2010 0555   RBC 3.99* 11/29/2010 0555   HGB 11.9* 11/29/2010 0555   HCT 37.3* 11/29/2010 0555   PLT 278 11/29/2010 0555   MCV 93.5 11/29/2010 0555   MCH 29.8 11/29/2010 0555   MCHC 31.9 11/29/2010 0555   RDW 15.3 11/29/2010 0555   LYMPHSABS 5.0* 11/19/2010 1715   MONOABS 0.7 11/19/2010 1715   EOSABS 0.3 11/19/2010 1715   BASOSABS 0.0 11/19/2010 1715   Lab Results  Component Value Date   TSH 0.895 11/20/2010   FreeT4>>1.82 11/20/2010   ASSESSMENT/PLAN:  1) Cerebellar Ischemic infract with ? Hemorrhage transformation/Coma, now persistent vegetative state.  Off hypertonic saline per neuro and Na is dropping slowly. Prognosis remains very poor.  No reasonable chance are recovery from a neurologic standpoint.  Not a VP shunt candidate, unclear what will happen once ventriculostomy is out. Plan:   - Per neuro and neurosurgery.  2) Acute resp failure: Due to inability to protect airway. PLAN - Place on TC as tolerated including overnight as  the patient tolerated that well. - ENT trached patient on 11/16. - Unlikely to require vent for much longer.  3) A. Fib: Heparin stopped following cerebellar hemorrhage 11/21/10. PLAN - Monitor.  4) Hypertension: off cardene. PLAN: - PRN fentanyl for pain - Increased Norvasc to 10 mg po daily and now off.  5) h/o CAD Plan: - Monitor  6) H/O Gout Plan: - Will need to resume allopurinol, colchicine when able to take oral  medication.  7) Right lower lobe airspace, possible pneumonia (nos to date). Plan: - HCAP coverage. - D/Ced vancomycin and will maintain on zosyn for 8 days (today is day 6). - Change TLC to PICC line since patient remains febrile (center from infection).  8) Thyroiditis Plan: - CT images d/w radiology with concerns for thyroid inflammation - F/U mild elevation in FT4, and low normal TSH>>will monitor for now  9) Acute renal failure, slight worsening of renal failure Plan: - Keep euvolemic. - Monitor BMP.  10) Hypernatremia. Slowly improving.  Plan: - No diuresis. - Per neuro.  11) Hyperglycemia Plan: - SSI, increase lantus  GI consult for PEG called, will see/do on Monday.  Critical care time 35 minutes.  Issacc Merlo 11/29/2010, 9:16 AM

## 2010-11-29 NOTE — Progress Notes (Signed)
Overall no change in status. Patient remains unconscious with minimal neurologic function. Ventricular output appears to be slowing down.  Plan to continue ventriculostomy weaning. No new neurosurgical interventions needed.

## 2010-11-30 ENCOUNTER — Inpatient Hospital Stay (HOSPITAL_COMMUNITY): Payer: 59

## 2010-11-30 DIAGNOSIS — J9600 Acute respiratory failure, unspecified whether with hypoxia or hypercapnia: Secondary | ICD-10-CM

## 2010-11-30 DIAGNOSIS — R4020 Unspecified coma: Secondary | ICD-10-CM

## 2010-11-30 DIAGNOSIS — I6340 Cerebral infarction due to embolism of unspecified cerebral artery: Secondary | ICD-10-CM

## 2010-11-30 DIAGNOSIS — I629 Nontraumatic intracranial hemorrhage, unspecified: Secondary | ICD-10-CM

## 2010-11-30 LAB — MAGNESIUM: Magnesium: 2.4 mg/dL (ref 1.5–2.5)

## 2010-11-30 LAB — BASIC METABOLIC PANEL
BUN: 34 mg/dL — ABNORMAL HIGH (ref 6–23)
CO2: 25 mEq/L (ref 19–32)
Chloride: 115 mEq/L — ABNORMAL HIGH (ref 96–112)
Creatinine, Ser: 1.1 mg/dL (ref 0.50–1.35)
Glucose, Bld: 212 mg/dL — ABNORMAL HIGH (ref 70–99)

## 2010-11-30 LAB — CBC
HCT: 38.9 % — ABNORMAL LOW (ref 39.0–52.0)
MCHC: 31.1 g/dL (ref 30.0–36.0)
MCV: 93.5 fL (ref 78.0–100.0)
RDW: 15.2 % (ref 11.5–15.5)

## 2010-11-30 LAB — BLOOD GAS, ARTERIAL
Drawn by: 13898
TCO2: 26 mmol/L (ref 0–100)
pCO2 arterial: 34.4 mmHg — ABNORMAL LOW (ref 35.0–45.0)
pH, Arterial: 7.475 — ABNORMAL HIGH (ref 7.350–7.450)

## 2010-11-30 LAB — CULTURE, BLOOD (ROUTINE X 2): Culture: NO GROWTH

## 2010-11-30 LAB — PHOSPHORUS: Phosphorus: 2.9 mg/dL (ref 2.3–4.6)

## 2010-11-30 LAB — GLUCOSE, CAPILLARY: Glucose-Capillary: 192 mg/dL — ABNORMAL HIGH (ref 70–99)

## 2010-11-30 MED ORDER — CLONIDINE HCL 0.2 MG PO TABS
0.2000 mg | ORAL_TABLET | Freq: Three times a day (TID) | ORAL | Status: DC
  Administered 2010-11-30 – 2010-12-02 (×7): 0.2 mg via ORAL
  Filled 2010-11-30 (×9): qty 1

## 2010-11-30 NOTE — Progress Notes (Signed)
Patient GCS at 0700 was 8, upon reexamination GCS was 4. Dr. Anne Hahn notified, stated he would be by to check on the patient. Dr. Anne Hahn came by and reexamined the patient and stated that he was the same based on his examination this morning.

## 2010-11-30 NOTE — Progress Notes (Signed)
10:30 Yacomb notified of 5 run of V-tach.

## 2010-11-30 NOTE — Progress Notes (Signed)
Subjective: The patient is stuporous, intubated, unresponsive.  Objective: Vital signs in last 24 hours: Temp:  [98.4 F (36.9 C)-100.1 F (37.8 C)] 98.4 F (36.9 C) (11/18 0400) Pulse Rate:  [35-99] 49  (11/18 0700) Resp:  [14-31] 19  (11/18 0700) BP: (109-187)/(79-115) 179/112 mmHg (11/18 0700) SpO2:  [99 %-100 %] 100 % (11/18 0700) FiO2 (%):  [28 %] 28 % (11/18 0900) Weight:  [95.7 kg (210 lb 15.7 oz)] 210 lb 15.7 oz (95.7 kg) (11/18 0500) Weight change: -2.2 kg (-4 lb 13.6 oz) Last BM Date:  (flexiseal patent)  Intake/Output from previous day: 11/17 0701 - 11/18 0700 In: 2525 [I.V.:360; NG/GT:1970; IV Piggyback:195] Out: 2793 [Urine:2475; Drains:118; Stool:200] Intake/Output this shift: Total I/O In: 167.5 [I.V.:20; NG/GT:135; IV Piggyback:12.5] Out: 105 [Urine:100; Drains:5]  The patient is intubated, minimally responsive. Eyes are deviated to the left, with pinpoint pupils. Some doll's eyes are noted.  Respiratory examination reveals occasional bilateral rhonchi.  Cardiovascular examination reveals an occasional irregular heart rhythm. No obvious murmurs or rubs are noted.  Abdomen reveals positive bowel sounds, no organomegaly or tenderness is noted.  Extremities are with 1-2+ edema to ankles.  Neurologic examination reveals that the patient is minimally responsive. The patient has depressed reflexes on the left, somewhat elevated on the right, with decerebrate posturing with the right arm with sternal rub. A mild Babinski response is seen with the right leg with deep pain stimulation. No movement is seen on the left side.  The patient could not cooperate for cerebellar testing.    Lab Results:  [facility] 11/30/10 0601 11/29/10 0555  WBC 11.9* 10.7*  HGB 12.1* 11.9*  HCT 38.9* 37.3*  PLT 257 278   BMET  Basename 11/30/10 0601 11/29/10 2340 11/29/10 0555  NA 149* 151* --  K 3.7 -- 3.7  CL 115* -- 118*  CO2 25 -- 27  GLUCOSE 212* -- 197*  BUN 34* -- 38*    CREATININE 1.10 -- 1.38*  CALCIUM 10.0 -- 9.6    Studies/Results: Dg Chest Portable 2 Views  11/30/2010  *RADIOLOGY REPORT*  Clinical Data: Evaluate central lines.  PORTABLE CHEST - 2 VIEW  Comparison: 11/29/2010  Findings: Left PICC line, with catheter tip projecting over the proximal SVC, just below the left brachiocephalic vein/SVC confluence. Right subclavian central venous catheter tip projects over the mid SVC.  Prominent cardiomediastinal contours, increased in the interval.  This may be due to differences in technique/rotation.  Tracheostomy tube remains in place.  Bibasilar opacities.  No acute osseous abnormality. Feeding tube descends into the abdomen, tip projecting over the mid stomach.  IMPRESSION: Left PICC line tip projects over the SVC just below the confluence. Right subclavian line tip projects over the mid SVC.  Cardiomediastinal contours are more prominent in the interval, may be exaggerated by technique/positioning. However, if there is any clinical concern, chest CT should be considered.  Feeding tube tip projects over the mid stomach.  Original Report Authenticated By: Waneta Martins, M.D.   Dg Chest Port 1 View  11/29/2010  *RADIOLOGY REPORT*  Clinical Data: PICC line placement  PORTABLE CHEST - 1 VIEW  Comparison: 11/29/2010 at 1:31 p.m.  Findings: Tracheostomy tube is appropriately positioned.  Feeding tube tip terminates below the level of the hemidiaphragms but is not included in the field of view.  Right-sided PICC line tip now terminates 5 cm inferior to the carina at the level of the cavoatrial junction.  No change otherwise.  IMPRESSION: Right-sided PICC line tip  now terminates at the cavoatrial junction.  No pneumothorax.  Original Report Authenticated By: Harrel Lemon, M.D.   Chest Portable 1 View Post Insertion To Confirm Placement As Interpreted By Radiologist  11/29/2010  *RADIOLOGY REPORT*  Clinical Data: Central line placement  PORTABLE CHEST - 1 VIEW   Comparison: Earlier film of the same day  Findings: Interval placement of a left arm PICC, tip in the mid right atrium, should be retracted 7 cm to the cavoatrial junction. The tracheostomy device, feeding tube, and right subclavian central line are stable in position.  Stable cardiomegaly.  Somewhat lower lung volumes with patchy perihilar and bibasilar edema or infiltrates more conspicuous.  No effusion.  IMPRESSION:  1.  Low position of the left arm PICC, should be retracted 7 cm. 2.  Low volumes with some increase in perihilar and bibasilar atelectasis or infiltrates.  Original Report Authenticated By: Osa Craver, M.D.   Dg Chest Port 1 View  11/29/2010  *RADIOLOGY REPORT*  Clinical Data: Tracheostomy tube placement.  PORTABLE CHEST - 1 VIEW  Comparison: Chest 11/28/2010.  Findings: Endotracheal tube has been removed with a new tracheostomy tube in place.  The tip of the tube is in good position at the level of the clavicular heads.  Feeding tube and right subclavian central venous catheter are again noted.  There is cardiomegaly but no pulmonary edema.  Lungs are clear.  No pneumothorax.  IMPRESSION:  1.  New tracheostomy tube in good position. 2.  No acute disease.  Original Report Authenticated By: Bernadene Bell. Maricela Curet, M.D.    Medications:  Scheduled:   . amLODipine  10 mg Oral Daily  . antiseptic oral rinse  15 mL Mouth Rinse QID  . carvedilol  25 mg Oral BID WC  . chlorhexidine  15 mL Mouth Rinse BID  . cloNIDine  0.2 mg Oral TID  . diltiazem  30 mg Per Tube Q6H  . enoxaparin (LOVENOX) injection  40 mg Subcutaneous Q24H  . famotidine  20 mg Per Tube Every 12 hours (non-specified)  . feeding supplement  30 mL Per Tube 5 X Daily  . free water  200 mL Per Tube Q6H  . insulin aspart  0-7 Units Subcutaneous Q4H  . insulin glargine  10 Units Subcutaneous BID  . isosorbide-hydrALAZINE  1 tablet Oral TID  . piperacillin-tazobactam (ZOSYN)  IV  3.375 g Intravenous Q8H  . potassium  chloride  40 mEq Per Tube TID  . ramipril  10 mg Oral BID  . rosuvastatin  5 mg Oral q1800  . DISCONTD: cloNIDine  0.2 mg Oral BID  . DISCONTD: furosemide  40 mg Intravenous Q6H  . DISCONTD: potassium chloride  40 mEq Per Tube TID   Continuous:   . feeding supplement (PIVOT 1.5 CAL) 1,000 mL (11/29/10 0646)  . niCARDipine 5 mg/hr (11/28/10 0550)   ZOX:WRUEAV chloride, acetaminophen (TYLENOL) oral liquid 160 mg/5 mL, fentaNYL, labetalol, metoprolol  Assessment/Plan: 1. Right cerebellar stroke with hemorrhagic conversion  2. Obstructive hydrocephalus  3. Hypertension  4. Atrial fibrillation  5. Vertebrobasilar insufficiency  A repeat CT scan of the brain done this morning reveals some mild decrease in ventricular size, with a VP shunt in place. The patient appears to have ongoing significant edema involving the right cerebellum. No significant change was seen from prior evaluation. Formal radiographic interpretation is pending. The patient continues to have elevated blood pressures, with diastolics greater than 100 or 110. The patient will have an increase in the  clonidine dosing to a 0.2 mg tablet 3 times daily per Panda. Clinical examination has not changed. The patient is minimally responsive, with decerebrate posturing on the right arm and leg. Prognosis is guarded. I have discussed clinical findings with the brother. Critical care medicine is following the patient.  LOS: 11 days   WILLIS,CHARLES KEITH 11/30/2010, 9:24 AM

## 2010-11-30 NOTE — Progress Notes (Signed)
11:30 Dr.Yacomb called to request order for removal of central line. Dr. Meda Klinefelter confirmed the request.

## 2010-11-30 NOTE — Progress Notes (Signed)
ANTIBIOTIC CONSULT NOTE - FOLLOW UP  Pharmacy Consult:  Zosyn Indication: HCAP  No Known Allergies  Patient Measurements: Height: 5\' 9"  (175.3 cm) Weight: 210 lb 15.7 oz (95.7 kg) IBW/kg (Calculated) : 70.7    Vital Signs: Temp: 98.4 F (36.9 C) (11/18 0400) Temp src: Oral (11/18 0400) BP: 179/107 mmHg (11/18 1030) Pulse Rate: 49  (11/18 0700) Intake/Output from previous day: 11/17 0701 - 11/18 0700 In: 2525 [I.V.:360; NG/GT:1970; IV Piggyback:195] Out: 2793 [Urine:2475; Drains:118; Stool:200] Intake/Output from this shift: Total I/O In: 277.5 [I.V.:60; NG/GT:205; IV Piggyback:12.5] Out: 110 [Urine:100; Drains:10]  Labs:  [facility] 11/30/10 0601 11/29/10 0555 11/28/10 0600  WBC 11.9* 10.7* 9.7  HGB 12.1* 11.9* 12.3*  PLT 257 278 277  LABCREA -- -- --  CREATININE 1.10 1.38* 1.06   Estimated Creatinine Clearance: 85.6 ml/min (by C-G formula based on Cr of 1.1). No results found for this basename: VANCOTROUGH:2,VANCOPEAK:2,VANCORANDOM:2,GENTTROUGH:2,GENTPEAK:2,GENTRANDOM:2,TOBRATROUGH:2,TOBRAPEAK:2,TOBRARND:2,AMIKACINPEAK:2,AMIKACINTROU:2,AMIKACIN:2, in the last 72 hours   Microbiology: Recent Results (from the past 720 hour(s))  MRSA PCR SCREENING     Status: Normal   Collection Time   11/19/10  9:04 PM      Component Value Range Status Comment   MRSA by PCR NEGATIVE  NEGATIVE  Final   CULTURE, RESPIRATORY     Status: Normal   Collection Time   11/20/10  9:01 AM      Component Value Range Status Comment   Specimen Description TRACHEAL ASPIRATE   Final    Special Requests NONE   Final    Gram Stain     Final    Value: RARE WBC PRESENT,BOTH PMN AND MONONUCLEAR     NO SQUAMOUS EPITHELIAL CELLS SEEN     FEW GRAM POSITIVE COCCI     IN PAIRS RARE GRAM POSITIVE RODS     RARE GRAM NEGATIVE COCCI   Culture     Final    Value: MODERATE STAPHYLOCOCCUS AUREUS     Note: RIFAMPIN AND GENTAMICIN SHOULD NOT BE USED AS SINGLE DRUGS FOR TREATMENT OF STAPH INFECTIONS.   Report  Status 11/23/2010 FINAL   Final    Organism ID, Bacteria STAPHYLOCOCCUS AUREUS   Final   CULTURE, BLOOD (ROUTINE X 2)     Status: Normal   Collection Time   11/20/10 10:00 AM      Component Value Range Status Comment   Specimen Description BLOOD ARM LEFT   Final    Special Requests BOTTLES DRAWN AEROBIC AND ANAEROBIC [facility]   Final    Setup Time [id number]   Final    Culture NO GROWTH 5 DAYS   Final    Report Status 11/26/2010 FINAL   Final   CULTURE, BLOOD (ROUTINE X 2)     Status: Normal   Collection Time   11/20/10 10:20 AM      Component Value Range Status Comment   Specimen Description BLOOD HAND LEFT   Final    Special Requests BOTTLES DRAWN AEROBIC ONLY 5.0 CC   Final    Setup Time [id number]   Final    Culture NO GROWTH 5 DAYS   Final    Report Status 11/26/2010 FINAL   Final   CULTURE, BLOOD (ROUTINE X 2)     Status: Normal   Collection Time   11/24/10 11:45 AM      Component Value Range Status Comment   Specimen Description BLOOD LEFT ARM   Final    Special Requests BOTTLES DRAWN AEROBIC AND ANAEROBIC 10CC   Final  Setup Time [phone]   Final    Culture NO GROWTH 5 DAYS   Final    Report Status 11/30/2010 FINAL   Final   CULTURE, BLOOD (ROUTINE X 2)     Status: Normal   Collection Time   11/24/10 12:00 PM      Component Value Range Status Comment   Specimen Description BLOOD LEFT ARM   Final    Special Requests BOTTLES DRAWN AEROBIC AND ANAEROBIC 10CC   Final    Setup Time [id number]   Final    Culture NO GROWTH 5 DAYS   Final    Report Status 11/30/2010 FINAL   Final   URINE CULTURE     Status: Normal   Collection Time   11/24/10  1:41 PM      Component Value Range Status Comment   Specimen Description URINE, CLEAN CATCH   Final    Special Requests NONE   Final    Setup Time [id number]   Final    Colony Count NO GROWTH   Final    Culture NO GROWTH   Final    Report Status 11/25/2010 FINAL   Final   CULTURE, RESPIRATORY     Status: Normal    Collection Time   11/24/10  5:15 PM      Component Value Range Status Comment   Specimen Description TRACHEAL ASPIRATE   Final    Special Requests NONE   Final    Gram Stain     Final    Value: ABUNDANT WBC PRESENT, PREDOMINANTLY PMN     FEW SQUAMOUS EPITHELIAL CELLS PRESENT     RARE GRAM POSITIVE COCCI IN PAIRS   Culture Non-Pathogenic Oropharyngeal-type Flora Isolated.   Final    Report Status 11/27/2010 FINAL   Final     Anti-infectives     Start     Dose/Rate Route Frequency Ordered Stop   11/24/10 1400   vancomycin (VANCOCIN) 1,250 mg in sodium chloride 0.9 % 250 mL IVPB  Status:  Discontinued        1,250 mg 166.7 mL/hr over 90 Minutes Intravenous Every 12 hours 11/24/10 1327 11/28/10 1427   11/24/10 1400  piperacillin-tazobactam (ZOSYN) IVPB 3.375 g       3.375 g 12.5 mL/hr over 240 Minutes Intravenous 3 times per day 11/24/10 1327     11/20/10 1000   cefTRIAXone (ROCEPHIN) 1 g in dextrose 5 % 50 mL IVPB  Status:  Discontinued        1 g 100 mL/hr over 30 Minutes Intravenous Every 24 hours 11/20/10 0847 11/24/10 1115   11/20/10 0915   azithromycin (ZITHROMAX) 500 mg in dextrose 5 % 250 mL IVPB  Status:  Discontinued        500 mg 250 mL/hr over 60 Minutes Intravenous Every 24 hours 11/20/10 0825 11/24/10 1115   11/20/10 0830   cefTRIAXone (ROCEPHIN) injection 1 g  Status:  Discontinued        1 g Intramuscular Every 24 hours 11/20/10 0825 11/20/10 0847          Assessment: 55 YOM on day #6/8 of Zosyn for HCAP.  Renal fxn appropriate for current dosing of Zosyn.  Plan:  Continue Zosyn 3.375gm IV Q8H. F/U daily.   [name], [name] Dien 11/30/2010,11:42 AM

## 2010-11-30 NOTE — Progress Notes (Signed)
[name] is a 55 y.o. male former smoker admitted on 11/19/2010 with acute onset of slurred speech and Lt sided weakness.  Intubated in ED for airway protection and pulmonary edema.  Had spontaneous resolution of neuro symptoms. PMHx DM, CAD, CHF, HTN, Gout, A fib.  Line/tubes: ETT 11/7>>11/9, 11/10 (cerebellar hge) >>11/16 Trach (ENT) 11/16>>> Ventric 11/10 (dr Sherlon Handing emergent) >> R [place] TLC 11/7>>>11/17 R PICC line 11/17>>>  Cx: Blood 11/8>>NTD Sputum 11/8>>Moderate Staph aureus. Blood 11/12>>>NTD Urine 11/12>>>NTD Sputum 11/12: abundant GPC pairs/abundant WBC>>>  Abx: Rocephin 11/8>>11/12 Zithromax 11/8>>11/12 Zosyn 11/12>>> Vancomycin 11/12>>>11/16  Best practice: Pepcid Heparin gtt/coumadin for A fib >>  stopped 11/19   Consults: Neuro Cardiology [name])  Tests/events: 11/7: CT head>>negative 11/7: CT chest>>b/l GGO more at bases, small b/l effusions, no PE, dependent ATX 11/8: MRI head w/o contrast>>Acute/subacute non-hemorrhagic infarct Rt superior cerebellar artery territory, remote Lt cerebellar infarct, multiple punctated areas of remote hemorrhage compatible with amyloid angiopathy 11/8: Echo>>severe LVH, EF 25 to 35%, mild AR, mild MR, PAS 11/10 - CT head am: marked hgic transformation of rt cerebaellar infarct -> supratentorial hydrocephalus -> ventric drain 11/10 - GCS 5 then reintubated.  SUBJECTIVE: No events overnight, trach collar all day and all night yesterday.  OBJECTIVE:  Temp:  [98.4 F (36.9 C)-100.1 F (37.8 C)] 98.4 F (36.9 C) (11/18 0400) Pulse Rate:  [49-99] 49  (11/18 0700) Resp:  [14-31] 19  (11/18 0700) BP: (109-187)/(79-115) 179/112 mmHg (11/18 0700) SpO2:  [99 %-100 %] 100 % (11/18 0700) FiO2 (%):  [28 %] 28 % (11/18 0900) Weight:  [95.7 kg (210 lb 15.7 oz)] 210 lb 15.7 oz (95.7 kg) (11/18 0500)   Intake/Output Summary (Last 24 hours) at 11/30/10 1010 Last data filed at 11/30/10 1000  Gross per 24 hour  Intake    2400 ml  Output   2582 ml  Net   -182 ml   General - obese, GCS 3 -5 ENT: - Pupils reactive Cardiac RRR, Nl S1/S2, -M/R/G. Chest - Coarse BS bilaterally Abd - soft, non-tender, ND and +BS Ext - no edema but some bruises in left arm and forearm ? Related to high INR and bp cuff: stable since 11/10 Neuro - GCS 5. Gag +. Doing PSV.   Lab Results  Component Value Date   CREATININE 1.10 11/30/2010   BUN 34* 11/30/2010   NA 149* 11/30/2010   K 3.7 11/30/2010   CL 115* 11/30/2010   CO2 25 11/30/2010   CBC    Component Value Date/Time   WBC 11.9* 11/30/2010 0601   RBC 4.16* 11/30/2010 0601   HGB 12.1* 11/30/2010 0601   HCT 38.9* 11/30/2010 0601   PLT 257 11/30/2010 0601   MCV 93.5 11/30/2010 0601   MCH 29.1 11/30/2010 0601   MCHC 31.1 11/30/2010 0601   RDW 15.2 11/30/2010 0601   LYMPHSABS 5.0* 11/19/2010 1715   MONOABS 0.7 11/19/2010 1715   EOSABS 0.3 11/19/2010 1715   BASOSABS 0.0 11/19/2010 1715   Lab Results  Component Value Date   TSH 0.895 11/20/2010   FreeT4>>1.82 11/20/2010   ASSESSMENT/PLAN:  1) Cerebellar Ischemic infract with ? Hemorrhage transformation/Coma, now persistent vegetative state.  Off hypertonic saline per neuro and Na is dropping slowly. Prognosis remains very poor.  No reasonable chance are recovery from a neurologic standpoint.  Not a VP shunt candidate, unclear what will happen once ventriculostomy is out.  Will defer to neuro/neurosurg at this point. Plan:   - Per neuro and  neurosurgery. - Neurosurg to address the ventric at this point and to reassess the need vs appropriateness for a VP shunt.  2) Acute resp failure: Due to inability to protect airway.  Tolerated TC overnight. PLAN - D/C vent. - ENT trached patient on 11/16.  3) A. Fib: Heparin stopped following cerebellar hemorrhage 11/21/10 now on lovenox. PLAN - Monitor.  4) Hypertension: off cardene. PLAN: - PRN fentanyl for pain - Increased Norvasc to 10 mg po daily and now off  cardene and tolerating it well.  5) h/o CAD Plan: - Monitor  6) H/O Gout Plan: - Will need to resume allopurinol, colchicine when able to take oral medication.  7) Right lower lobe airspace, possible pneumonia (nos to date). Plan: - HCAP coverage. - D/Ced vancomycin and will maintain on zosyn for 8 days (today is day 7). - Changed TLC to PICC line ib 11/18 with resolution of fever.  8) Thyroiditis Plan: - CT images d/w radiology with concerns for thyroid inflammation - F/U mild elevation in FT4, and low normal TSH>>will monitor for now  9) Acute renal failure, slight worsening of renal failure Plan: - Keep euvolemic. - Monitor BMP.  10) Hypernatremia. Slowly improving.  Plan: - No diuresis. - Per neuro.  11) Hyperglycemia Plan: - SSI, increase lantus  GI consult for PEG called, will see/do on Monday.  Will hold in the ICU as long as a ventric is present, NS to assess.  Jacques Willingham 11/30/2010, 10:10 AM

## 2010-11-30 NOTE — Progress Notes (Signed)
0800 Dr. Thad Ranger notified of Na level of 149. She stated not to start 3%NaCl.

## 2010-11-30 NOTE — Progress Notes (Signed)
11:30 Dr. Jordan Likes called to clarify order for clamp of IVC drain. He stated that he did want it clamp and planned on taking it out tomorrow.

## 2010-11-30 NOTE — Progress Notes (Signed)
No overall change in status. Patient remains unconscious on ventilator. No wakening to noxious stimuli. Pupils small and minimally reactive. Positive gag and cough reflexes. Patient extends on the right to noxious stimuli.  Centric output slowing down. We will clamp and draped in check followup head CT scan in morning. Consider: The ventriculostomy at that time.

## 2010-12-01 ENCOUNTER — Inpatient Hospital Stay (HOSPITAL_COMMUNITY): Payer: 59

## 2010-12-01 DIAGNOSIS — R1314 Dysphagia, pharyngoesophageal phase: Secondary | ICD-10-CM

## 2010-12-01 DIAGNOSIS — R633 Feeding difficulties, unspecified: Secondary | ICD-10-CM

## 2010-12-01 LAB — BASIC METABOLIC PANEL
BUN: 35 mg/dL — ABNORMAL HIGH (ref 6–23)
Creatinine, Ser: 1.23 mg/dL (ref 0.50–1.35)
GFR calc Af Amer: 74 mL/min — ABNORMAL LOW (ref >90)
GFR calc non Af Amer: 64 mL/min — ABNORMAL LOW (ref >90)
Glucose, Bld: 184 mg/dL — ABNORMAL HIGH (ref 70–99)

## 2010-12-01 LAB — CBC
HCT: 35.8 % — ABNORMAL LOW (ref 39.0–52.0)
MCH: 29.1 pg (ref 26.0–34.0)
MCHC: 31.6 g/dL (ref 30.0–36.0)
MCV: 92.3 fL (ref 78.0–100.0)
RDW: 15.1 % (ref 11.5–15.5)

## 2010-12-01 LAB — BLOOD GAS, ARTERIAL
Bicarbonate: 25.6 mEq/L — ABNORMAL HIGH (ref 20.0–24.0)
PEEP: 5 cmH2O
Patient temperature: 98.6
TCO2: 26.6 mmol/L (ref 0–100)
pH, Arterial: 7.485 — ABNORMAL HIGH (ref 7.350–7.450)
pO2, Arterial: 179 mmHg — ABNORMAL HIGH (ref 80.0–100.0)

## 2010-12-01 LAB — URINE MICROSCOPIC-ADD ON

## 2010-12-01 LAB — URINALYSIS, ROUTINE W REFLEX MICROSCOPIC
Glucose, UA: NEGATIVE mg/dL
Ketones, ur: NEGATIVE mg/dL
Leukocytes, UA: NEGATIVE
Nitrite: NEGATIVE
Protein, ur: 100 mg/dL — AB
Urobilinogen, UA: 1 mg/dL (ref 0.0–1.0)

## 2010-12-01 LAB — LACTIC ACID, PLASMA: Lactic Acid, Venous: 1.3 mmol/L (ref 0.5–2.2)

## 2010-12-01 LAB — GLUCOSE, CAPILLARY
Glucose-Capillary: 202 mg/dL — ABNORMAL HIGH (ref 70–99)
Glucose-Capillary: 225 mg/dL — ABNORMAL HIGH (ref 70–99)
Glucose-Capillary: 206 mg/dL — ABNORMAL HIGH (ref 70–99)
Glucose-Capillary: 233 mg/dL — ABNORMAL HIGH (ref 70–99)

## 2010-12-01 LAB — PROCALCITONIN: Procalcitonin: 0.17 ng/mL

## 2010-12-01 LAB — SODIUM
Sodium: 149 mEq/L — ABNORMAL HIGH (ref 135–145)
Sodium: 151 mEq/L — ABNORMAL HIGH (ref 135–145)

## 2010-12-01 LAB — OSMOLALITY, URINE: Osmolality, Ur: 908 mOsm/kg (ref 390–1090)

## 2010-12-01 LAB — T3, FREE: T3, Free: 1.7 pg/mL — ABNORMAL LOW (ref 2.3–4.2)

## 2010-12-01 MED ORDER — VANCOMYCIN HCL IN DEXTROSE 1-5 GM/200ML-% IV SOLN: 1000.0000 mg | Freq: Two times a day (BID) | INTRAVENOUS | Status: DC

## 2010-12-01 MED ORDER — CEFTAZIDIME 1 G IJ SOLR: 1.0000 g | Freq: Three times a day (TID) | INTRAMUSCULAR | Status: DC

## 2010-12-01 MED ORDER — DEXTROSE 5 % IV SOLN
1.0000 g | Freq: Three times a day (TID) | INTRAVENOUS | Status: DC
  Administered 2010-12-01 – 2010-12-02 (×4): 1 g via INTRAVENOUS
  Filled 2010-12-01 (×6): qty 1

## 2010-12-01 MED ORDER — INSULIN GLARGINE 100 UNIT/ML ~~LOC~~ SOLN
15.0000 [IU] | Freq: Two times a day (BID) | SUBCUTANEOUS | Status: DC
  Administered 2010-12-01 – 2010-12-02 (×3): 15 [IU] via SUBCUTANEOUS

## 2010-12-01 MED ORDER — SODIUM CHLORIDE 0.9 % IV SOLN: 250.0000 mL | INTRAVENOUS | Status: DC | PRN

## 2010-12-01 MED ORDER — SODIUM CHLORIDE 0.9 % IV SOLN
INTRAVENOUS | Status: DC
  Administered 2010-12-01 – 2010-12-03 (×3): via INTRAVENOUS
  Administered 2010-12-04: 50 mL/h via INTRAVENOUS
  Administered 2010-12-05 (×2): via INTRAVENOUS

## 2010-12-01 MED ORDER — VANCOMYCIN HCL 1000 MG IV SOLR
1250.0000 mg | Freq: Two times a day (BID) | INTRAVENOUS | Status: DC
  Administered 2010-12-01 – 2010-12-03 (×5): 1250 mg via INTRAVENOUS
  Filled 2010-12-01 (×5): qty 1250

## 2010-12-01 MED ORDER — SODIUM CHLORIDE 0.9 % IV BOLUS (SEPSIS)
250.0000 mL | Freq: Once | INTRAVENOUS | Status: AC
  Administered 2010-12-01: 250 mL via INTRAVENOUS

## 2010-12-01 NOTE — Progress Notes (Signed)
Stroke Team Progress Note  SUBJECTIVE  Brother at bedside. IVC clamped yest, reopened during the night. Dr Phoebe Perch plans to d/c IVC tomorrow. Temp 104 this am. On trach collar x 2 days, respirations more labored today. New zosyn.  OBJECTIVE Most recent Vital Signs: Temp: 99.5 F (37.5 C) (11/19 0400) Temp src: Oral (11/19 0400) BP: 173/104 mmHg (11/19 0800) Pulse Rate: 65  (11/19 0814) Respiratory Rate: 21 O2 Saturdation: 100%  CBG (last 3)   Basename 12/01/10 0834 12/01/10 0330 11/30/10 2321  GLUCAP 225* 202* 206*   Intake/Output from previous day: 11/18 0701 - 11/19 0700 In: 2395 [I.V.:280; NG/GT:2015; IV Piggyback:100] Out: 1786 [Urine:1490; Drains:21; Stool:275]  IV Fluid Intake:      . feeding supplement (PIVOT 1.5 CAL) 1,000 mL (11/30/10 1509)  . niCARDipine 5 mg/hr (11/28/10 0550)   Diet:   TF at 31mL/hr liquids  Activity: Bedrest  DVT Prophylaxis:  SCDs, Lovenox 40 mg sq daily   Studies: CBC     Status: Abnormal   Collection Time   12/01/10  5:30 AM      Component Value Range   WBC 10.2  4.0 - 10.5 (K/uL)   RBC 3.88 (*) 4.22 - 5.81 (MIL/uL)   Hemoglobin 11.3 (*) 13.0 - 17.0 (g/dL)   HCT 30.8 (*) 65.7 - 52.0 (%)   MCV 92.3  78.0 - 100.0 (fL)   MCH 29.1  26.0 - 34.0 (pg)   MCHC 31.6  30.0 - 36.0 (g/dL)   RDW 84.6  96.2 - 95.2 (%)   Platelets 270  150 - 400 (K/uL)  BASIC METABOLIC PANEL     Status: Abnormal   Collection Time   12/01/10  5:30 AM      Component Value Range   Sodium 150 (*) 135 - 145 (mEq/L)   Potassium 3.5  3.5 - 5.1 (mEq/L)   Chloride 115 (*) 96 - 112 (mEq/L)   CO2 26  19 - 32 (mEq/L)   Glucose, Bld 184 (*) 70 - 99 (mg/dL)   BUN 35 (*) 6 - 23 (mg/dL)   Creatinine, Ser 8.41  0.50 - 1.35 (mg/dL)   Calcium 9.7  8.4 - 32.4 (mg/dL)   GFR calc non Af Amer 64 (*) >90 (mL/min)   GFR calc Af Amer 74 (*) >90 (mL/min)  PHOSPHORUS     Status: Normal   Collection Time   12/01/10  5:30 AM      Component Value Range   Phosphorus 2.5  2.3 - 4.6  (mg/dL)  MAGNESIUM     Status: Normal   Collection Time   12/01/10  5:30 AM      Component Value Range   Magnesium 2.3  1.5 - 2.5 (mg/dL)     Dg Chest Portable 2 Views  11/30/2010  *RADIOLOGY REPORT*  Clinical Data: Evaluate central lines.  PORTABLE CHEST - 2 VIEW  Comparison: 11/29/2010  Findings: Left PICC line, with catheter tip projecting over the proximal SVC, just below the left brachiocephalic vein/SVC confluence. Right subclavian central venous catheter tip projects over the mid SVC.  Prominent cardiomediastinal contours, increased in the interval.  This may be due to differences in technique/rotation.  Tracheostomy tube remains in place.  Bibasilar opacities.  No acute osseous abnormality. Feeding tube descends into the abdomen, tip projecting over the mid stomach.  IMPRESSION: Left PICC line tip projects over the SVC just below the confluence. Right subclavian line tip projects over the mid SVC.  Cardiomediastinal contours are more prominent in the  interval, may be exaggerated by technique/positioning. However, if there is any clinical concern, chest CT should be considered.  Feeding tube tip projects over the mid stomach.  Original Report Authenticated By: Waneta Martins, M.D.   Dg Chest Port 1 View  11/29/2010  *RADIOLOGY REPORT*  Clinical Data: PICC line placement  PORTABLE CHEST - 1 VIEW  Comparison: 11/29/2010 at 1:31 p.m.  Findings: Tracheostomy tube is appropriately positioned.  Feeding tube tip terminates below the level of the hemidiaphragms but is not included in the field of view.  Right-sided PICC line tip now terminates 5 cm inferior to the carina at the level of the cavoatrial junction.  No change otherwise.  IMPRESSION: Right-sided PICC line tip now terminates at the cavoatrial junction.  No pneumothorax.  Original Report Authenticated By: Harrel Lemon, M.D.   Chest Portable 1 View Post Insertion To Confirm Placement As Interpreted By Radiologist  11/29/2010   *RADIOLOGY REPORT*  Clinical Data: Central line placement  PORTABLE CHEST - 1 VIEW  Comparison: Earlier film of the same day  Findings: Interval placement of a left arm PICC, tip in the mid right atrium, should be retracted 7 cm to the cavoatrial junction. The tracheostomy device, feeding tube, and right subclavian central line are stable in position.  Stable cardiomegaly.  Somewhat lower lung volumes with patchy perihilar and bibasilar edema or infiltrates more conspicuous.  No effusion.  IMPRESSION:  1.  Low position of the left arm PICC, should be retracted 7 cm. 2.  Low volumes with some increase in perihilar and bibasilar atelectasis or infiltrates.  Original Report Authenticated By: Osa Craver, M.D.   Ct Portable Head W/o Cm  12/01/2010  *RADIOLOGY REPORT*  Clinical Data: Follow-up bleed, drain.  CT HEAD WITHOUT CONTRAST  Technique:  Contiguous axial images were obtained from the base of the skull through the vertex without contrast.  Comparison: 11/30/2010  Findings: Right transfrontal approach intraventricular catheter with the tip near the midline, unchanged in position.  Mild lateral and third ventricular prominence is unchanged.  There is a layering blood within the occipital horns of the lateral ventricles. Blood also collects within the slit-like fourth ventricle. Intraparenchymal hemorrhage centered within the right cerebellum has increased slightly in size, now measuring up to 2 cm in maximum diameter.  Furthermore, there is increased surrounding edema. Again, effacement of the basilar cisterns likely reflects upward transtentorial herniation.  The cerebellar tonsils demonstrate mild downward herniation as well.  Right sided NG tube. Small amount high attenuation along the right tentorium.  No new areas of parenchymal hemorrhage.  White matter hypodensities.  Global edema. No acute osseous change.  IMPRESSION: Right cerebellar intraparenchymal hemorrhage measures slightly larger at 2  cm.  Increased surrounding edema.  Persistent upward transtentorial herniation, effacement of the fourth ventricle, and mild downward tonsillar herniation.  Right transfrontal approach intraventricular catheter.  There is persistent prominence of the lateral ventricles and third ventricle with layering hemorrhage within the occipital horns of the lateral ventricles.  Original Report Authenticated By: Waneta Martins, M.D.   Ct Portable Head W/o Cm  11/30/2010  *RADIOLOGY REPORT*  Clinical Data: Right cerebellar infarct with hemorrhage, hydrocephalus, postop ventricular drain  CT HEAD WITHOUT CONTRAST  Technique:  Contiguous axial images were obtained from the base of the skull through the vertex without contrast.  Comparison: 11/24/2010  Findings: Stable right frontal approach ventriculostomy catheter. Ventriculomegaly with stable versus mildly improved.  Layering hemorrhage in the posterior horns of the lateral ventricles,  mildly increased.  Right cerebellar infarct with hemorrhage, now measuring 12 x 14 mm, previously 20 x 21 mm.  Associated cerebellar edema with effacement of the 4th ventricle.  Effacement of the basilar cisterns, likely reflecting upward transtentorial herniation, unchanged.  IMPRESSION: Right cerebellar infarct with improving hemorrhage.  Associated cerebellar edema with effacement of the 4th ventricle and upward transtentorial herniation, unchanged.  Right frontal approach ventriculostomy catheter with ventriculomegaly, stable versus mildly improved.  Layering hemorrhage in the posterior horns of the lateral ventricles, mildly increased.  Original Report Authenticated By: Charline Bills, M.D.    Physical Exam:  patient is intubated he is not on any sedation. His is comatose and unresponsive. Pupils are 3 mm sluggishly reactive. Doll's eye movements are present . He has spontaneous side to side movements today. Marland Kitchen He has slight left gaze deviation. Corneal refelexes are  easily  elicitable He has a weak cough and gag reflex. There are no spontaneous extremity movements noted. He has minimal wthdrawal of all extremities to pain today. There is no withdrawal in the upper extremities. There is minimal withdrawal in both lower extremities to pain. Both plantars are upgoing.  Cardiac exam rapid heart sounds no murmurs. Lungs are clear to auscultation but has coarse conducted soundsin.   ASSESSMENT [name] is a 55 y.o. male with a a left-sided weakness and dizziness due to a right hemispheric right brain cardioembolic infarct secondary to atrial fibrillation. Post hospitalization hemorrhage.  Has had tracheostomy and doing well on trach collar but febrile last 2 days and  Ventriculostomy being weaned by Dr Blanche East I  Stroke risk factors:  atrial fibrillation, diabetes mellitus, hyperlipidemia, hypertension and CAD  Hospital day # 12  TREATMENT/PLAN Cooling blanket, tylenol and abx for temp  If temp continues, consider artic sun with induced hypothermia. He remians critically ill at risk for death and worsening and his care requires constant monitoring of vital signs, hemodynamics, respiratory and cardiac monitoring, and neurological assessment. Discussion with family, other specialists about plan of care. Medical decision making of high complexity. Dr. Pearlean Brownie spent 30 minutes of neurocritical care time in the care of this patient.   Joaquin Music, ANP-BC, GNP-BC [facility] Pager: [phone] 12/01/2010 8:53 AM  Dr. Delia Heady, Stroke Center Medical Director, has personally reviewed chart, pertinent data, examined the patient and developed the plan of care.

## 2010-12-01 NOTE — Progress Notes (Signed)
Pt placed back on the vent  Per MD order. Pt placed on PRVC, VT 570 (8cc's/kg), RR 14, PEEP 5, and FiO2 50%. Vent settings per MD order. Vitals remain WNL. The pt has a patent # [id number] with the cuff inflated. Pt is tolerating well at this time, No complications noted.

## 2010-12-01 NOTE — Progress Notes (Signed)
Nutrition Follow-up  Meds:     . amLODipine  10 mg Oral Daily  . antiseptic oral rinse  15 mL Mouth Rinse QID  . carvedilol  25 mg Oral BID WC  . chlorhexidine  15 mL Mouth Rinse BID  . cloNIDine  0.2 mg Oral TID  . diltiazem  30 mg Per Tube Q6H  . enoxaparin (LOVENOX) injection  40 mg Subcutaneous Q24H  . famotidine  20 mg Per Tube Every 12 hours (non-specified)  . feeding supplement  30 mL Per Tube 5 X Daily  . free water  200 mL Per Tube Q6H  . insulin aspart  0-7 Units Subcutaneous Q4H  . insulin glargine  10 Units Subcutaneous BID  . isosorbide-hydrALAZINE  1 tablet Oral TID  . piperacillin-tazobactam (ZOSYN)  IV  3.375 g Intravenous Q8H  . ramipril  10 mg Oral BID  . rosuvastatin  5 mg Oral q1800   Labs:      Component Value Date/Time   NA 150* 12/01/2010 0530   K 3.5 12/01/2010 0530   CL 115* 12/01/2010 0530   CO2 26 12/01/2010 0530   GLUCOSE 184* 12/01/2010 0530   BUN 35* 12/01/2010 0530   CREATININE 1.23 12/01/2010 0530   CALCIUM 9.7 12/01/2010 0530   GFRNONAA 64* 12/01/2010 0530   GFRAA 74* 12/01/2010 0530     Wt. Status: 210lbs 4lbs wt loss x 1 week 2/2 fluid  Diet: NPO, TF pivot 1.5 @35ml /hr and prostat 64 30ml five times daily  1260kcals/79gmprot/62ml H2O with prostat provides additional 360kcals/75gmprot to better meet prot needs  Total kcals 1620/154gm Prot to meet approx. 85% energy and 100%prot needs. Pt tolerating current TF regimen.  Nutr dx: Inadequate oral intake-ongoing  Goal: meet estimated nutr needs- met  Intervention: 1. Continue current TF regimen with prostat as ordered as adequate to meet nutritional needs  Monitor: TF tolerance/adequacy, wt trends, labs   Pager #: [phone]

## 2010-12-01 NOTE — Progress Notes (Signed)
GI  Chart reviewed, pt not a candidate for PEG placement due to his  Mental deterioration and general multiorgan  problems. Please call us  If PEG more realistic in the future.  Diamond Nickel MD

## 2010-12-01 NOTE — Progress Notes (Signed)
ANTIBIOTIC CONSULT NOTE - FOLLOW UP  Pharmacy Consult for [facility] and Vancomycin Indication: pneumonia  No Known Allergies  Patient Measurements: Height: 5\' 9"  (175.3 cm) Weight: 208 lb 5.4 oz (94.5 kg) IBW/kg (Calculated) : 70.7  Adjusted Body Weight:   Vital Signs: Temp: 102.8 F (39.3 C) (11/19 0900) Temp src: Oral (11/19 0900) BP: 158/107 mmHg (11/19 0900) Pulse Rate: 126  (11/19 0900) Intake/Output from previous day: 11/18 0701 - 11/19 0700 In: 2395 [I.V.:280; NG/GT:2015; IV Piggyback:100] Out: 1786 [Urine:1490; Drains:21; Stool:275] Intake/Output from this shift: Total I/O In: 335 [I.V.:40; NG/GT:270; IV Piggyback:25] Out: 500 [Urine:500]  Labs:  [facility] 12/01/10 0530 11/30/10 0601 11/29/10 0555  WBC 10.2 11.9* 10.7*  HGB 11.3* 12.1* 11.9*  PLT 270 257 278  LABCREA -- -- --  CREATININE 1.23 1.10 1.38*   Estimated Creatinine Clearance: 76.1 ml/min (by C-G formula based on Cr of 1.23). No results found for this basename: VANCOTROUGH:2,VANCOPEAK:2,VANCORANDOM:2,GENTTROUGH:2,GENTPEAK:2,GENTRANDOM:2,TOBRATROUGH:2,TOBRAPEAK:2,TOBRARND:2,AMIKACINPEAK:2,AMIKACINTROU:2,AMIKACIN:2, in the last 72 hours   Microbiology: Recent Results (from the past 720 hour(s))  MRSA PCR SCREENING     Status: Normal   Collection Time   11/19/10  9:04 PM      Component Value Range Status Comment   MRSA by PCR NEGATIVE  NEGATIVE  Final   CULTURE, RESPIRATORY     Status: Normal   Collection Time   11/20/10  9:01 AM      Component Value Range Status Comment   Specimen Description TRACHEAL ASPIRATE   Final    Special Requests NONE   Final    Gram Stain     Final    Value: RARE WBC PRESENT,BOTH PMN AND MONONUCLEAR     NO SQUAMOUS EPITHELIAL CELLS SEEN     FEW GRAM POSITIVE COCCI     IN PAIRS RARE GRAM POSITIVE RODS     RARE GRAM NEGATIVE COCCI   Culture     Final    Value: MODERATE STAPHYLOCOCCUS AUREUS     Note: RIFAMPIN AND GENTAMICIN SHOULD NOT BE USED AS SINGLE DRUGS FOR TREATMENT  OF STAPH INFECTIONS.   Report Status 11/23/2010 FINAL   Final    Organism ID, Bacteria STAPHYLOCOCCUS AUREUS   Final   CULTURE, BLOOD (ROUTINE X 2)     Status: Normal   Collection Time   11/20/10 10:00 AM      Component Value Range Status Comment   Specimen Description BLOOD ARM LEFT   Final    Special Requests BOTTLES DRAWN AEROBIC AND ANAEROBIC [facility]   Final    Setup Time [id number]   Final    Culture NO GROWTH 5 DAYS   Final    Report Status 11/26/2010 FINAL   Final   CULTURE, BLOOD (ROUTINE X 2)     Status: Normal   Collection Time   11/20/10 10:20 AM      Component Value Range Status Comment   Specimen Description BLOOD HAND LEFT   Final    Special Requests BOTTLES DRAWN AEROBIC ONLY 5.0 CC   Final    Setup Time [id number]   Final    Culture NO GROWTH 5 DAYS   Final    Report Status 11/26/2010 FINAL   Final   CULTURE, BLOOD (ROUTINE X 2)     Status: Normal   Collection Time   11/24/10 11:45 AM      Component Value Range Status Comment   Specimen Description BLOOD LEFT ARM   Final    Special Requests BOTTLES DRAWN AEROBIC AND ANAEROBIC  10CC   Final    Setup Time [id number]   Final    Culture NO GROWTH 5 DAYS   Final    Report Status 11/30/2010 FINAL   Final   CULTURE, BLOOD (ROUTINE X 2)     Status: Normal   Collection Time   11/24/10 12:00 PM      Component Value Range Status Comment   Specimen Description BLOOD LEFT ARM   Final    Special Requests BOTTLES DRAWN AEROBIC AND ANAEROBIC 10CC   Final    Setup Time [id number]   Final    Culture NO GROWTH 5 DAYS   Final    Report Status 11/30/2010 FINAL   Final   URINE CULTURE     Status: Normal   Collection Time   11/24/10  1:41 PM      Component Value Range Status Comment   Specimen Description URINE, CLEAN CATCH   Final    Special Requests NONE   Final    Setup Time [id number]   Final    Colony Count NO GROWTH   Final    Culture NO GROWTH   Final    Report Status 11/25/2010 FINAL   Final   CULTURE,  RESPIRATORY     Status: Normal   Collection Time   11/24/10  5:15 PM      Component Value Range Status Comment   Specimen Description TRACHEAL ASPIRATE   Final    Special Requests NONE   Final    Gram Stain     Final    Value: ABUNDANT WBC PRESENT, PREDOMINANTLY PMN     FEW SQUAMOUS EPITHELIAL CELLS PRESENT     RARE GRAM POSITIVE COCCI IN PAIRS   Culture Non-Pathogenic Oropharyngeal-type Flora Isolated.   Final    Report Status 11/27/2010 FINAL   Final     Anti-infectives     Start     Dose/Rate Route Frequency Ordered Stop   12/01/10 0930   cefTAZidime (FORTAZ) injection 1 g  Status:  Discontinued     Comments: Make this IV now and pharmacy to dose NOT IM      1 g Intramuscular 3 times per day 12/01/10 0932 12/01/10 0938   12/01/10 0930   vancomycin (VANCOCIN) IVPB 1000 mg/200 mL premix  Status:  Discontinued        1,000 mg 200 mL/hr over 60 Minutes Intravenous Every 12 hours 12/01/10 0932 12/01/10 0938   11/24/10 1400   vancomycin (VANCOCIN) 1,250 mg in sodium chloride 0.9 % 250 mL IVPB  Status:  Discontinued        1,250 mg 166.7 mL/hr over 90 Minutes Intravenous Every 12 hours 11/24/10 1327 11/28/10 1427   11/24/10 1400   piperacillin-tazobactam (ZOSYN) IVPB 3.375 g  Status:  Discontinued        3.375 g 12.5 mL/hr over 240 Minutes Intravenous 3 times per day 11/24/10 1327 12/01/10 0932   11/20/10 1000   cefTRIAXone (ROCEPHIN) 1 g in dextrose 5 % 50 mL IVPB  Status:  Discontinued        1 g 100 mL/hr over 30 Minutes Intravenous Every 24 hours 11/20/10 0847 11/24/10 1115   11/20/10 0915   azithromycin (ZITHROMAX) 500 mg in dextrose 5 % 250 mL IVPB  Status:  Discontinued        500 mg 250 mL/hr over 60 Minutes Intravenous Every 24 hours 11/20/10 0825 11/24/10 1115   11/20/10 0830   cefTRIAXone (ROCEPHIN) injection 1 g  Status:  Discontinued        1 g Intramuscular Every 24 hours 11/20/10 0825 11/20/10 0847          Assessment: Patient is a 55 y.o M currently on  zosyn day # 7 for PNA.  Trach Aspirate Cx on 11/12 with GPC and 11/08 with MSSA. Patient is still febrile with Tmax 102.8.  To change Abx from zosyn to vancomycin and [place].  OK for pharmacy to adjust Abx dose per Dr. Tyson Alias.  Goal of Therapy:  Vancomycin trough level 15-20 mcg/ml  Plan:  1) Fortaz 1gm IV q8h 2) change vancomycin to 1250 mg IV q12h   Adilson Grafton P 12/01/2010,10:27 AM

## 2010-12-01 NOTE — Progress Notes (Signed)
Subjective: Comatose, ventilated on trach  Objective: Vital signs in last 24 hours: Temp:  [98.4 F (36.9 C)-99.5 F (37.5 C)] 99.5 F (37.5 C) (11/19 0400) Pulse Rate:  [45-99] 99  (11/19 0700) Resp:  [14-35] 28  (11/19 0700) BP: (110-179)/(77-116) 149/100 mmHg (11/19 0700) SpO2:  [99 %-100 %] 100 % (11/19 0700) FiO2 (%):  [28 %] 28 % (11/19 0335)  Intake/Output from previous day: 11/18 0701 - 11/19 0700 In: 2395 [I.V.:280; NG/GT:2015; IV Piggyback:100] Out: 1786 [Urine:1490; Drains:21; Stool:275] Intake/Output this shift:    neuo - unresponsive  ventric - slight drainane at 20 cm (was clamped yesterday)  Lab Results:  [facility] 12/01/10 0530 11/30/10 0601  WBC 10.2 11.9*  HGB 11.3* 12.1*  HCT 35.8* 38.9*  PLT 270 257   BMET  Basename 12/01/10 0530 12/01/10 11/30/10 0601  NA 150* 149* --  K 3.5 -- 3.7  CL 115* -- 115*  CO2 26 -- 25  GLUCOSE 184* -- 212*  BUN 35* -- 34*  CREATININE 1.23 -- 1.10  CALCIUM 9.7 -- 10.0    Studies/Results: CT head - no change in ventricles with ventric clamped Assessment/Plan: ventric was opened - will drain at 24 and plan on removing in 1 day or so - no other neurosurgical intervention indicated  LOS: 12 days     Daekwon Beswick R, MD 12/01/2010, 7:56 AM

## 2010-12-01 NOTE — Progress Notes (Signed)
Inpatient Diabetes Program Recommendations  AACE/ADA: New Consensus Statement on Inpatient Glycemic Control (2009)  Target Ranges:  Prepandial:   less than 140 mg/dL      Peak postprandial:   less than 180 mg/dL (1-2 hours)      Critically ill patients:  140 - 180 mg/dL   Reason for Visit: Elevated glucose:  206, 202, 225 mg/dL  Inpatient Diabetes Program Recommendations Insulin - Meal Coverage: Add Tube feed coverage Novolog 4 units q4hrs

## 2010-12-01 NOTE — Progress Notes (Signed)
[name] is a 55 y.o. male former smoker admitted on 11/19/2010 with acute onset of slurred speech and Lt sided weakness. Dx Cerebekllar ischemic cva then converted to bleed. PMHx DM, CAD, CHF, HTN, Gout, A fib.  Line/tubes: ETT 11/7>>11/9, 11/10 (cerebellar hge) >>11/16 Trach (ENT) 11/16>>> Ventric 11/10 (dr Sherlon Handing emergent) >> R [place] TLC 11/7>>>11/17 R PICC line 11/17>>>  Cx: Blood 11/8>>NTD Sputum 11/8>>Moderate Staph aureus. Blood 11/12>>>NTD Urine 11/12>>>NTD Sputum 11/12: abundant GPC pairs/abundant WBC>>>NF 11/19 BC x 2 (fever104) Csf 11/19>>>  Abx: Rocephin 11/8>>11/12 Zithromax 11/8>>11/12 Zosyn 11/12>>>11/19 Vancomycin 11/12>>>11/16 vanc ( fever 104, ivc) 11/19>>> ceftaz 11/19 (CSF ) 11/19>>>  Best practice: Pepcid Heparin gtt/coumadin for A fib >>  stopped 11/19   Consults: Neuro Cardiology [name]) [name]  Tests/events: 11/7: CT head>>negative 11/7: CT chest>>b/l GGO more at bases, small b/l effusions, no PE, dependent ATX 11/8: MRI head w/o contrast>>Acute/subacute non-hemorrhagic infarct Rt superior cerebellar artery territory, remote Lt cerebellar infarct, multiple punctated areas of remote hemorrhage compatible with amyloid angiopathy 11/8: Echo>>severe LVH, EF 25 to 35%, mild AR, mild MR, PAS 11/10 - CT head am: marked hgic transformation of rt cerebaellar infarct -> supratentorial hydrocephalus -> ventric drain 11/10 - GCS 5 then reintubated. 11/18 CT head>>>Right cerebellar infarct with improving hemorrhage. Associated<BR>cerebellar edema with effacement of the 4th ventricle and upward<BR>transtentorial herniation, unchanged.<BR> <BR>Right frontal approach ventriculostomy catheter with<BR>ventriculomegaly, stable versus mildly improved.<BR> <BR>Layering hemorrhage in the posterior horns of the lateral<BR>ventricles, mildly increased.<BR> 11/19 CT head>>>Right cerebellar intraparenchymal hemorrhage measures slightly<BR>larger at 2 cm.  Increased surrounding edema. Persistent upward<BR>transtentorial herniation, effacement of the fourth ventricle, and<BR>mild downward tonsillar herniation.<BR> <BR>Right transfrontal approach intraventricular catheter. There is<BR>persistent prominence of the lateral ventricles and third ventricle<BR>with layering hemorrhage within the occipital horns of the lateral<BR>ventricles.<BR>> 11/19 - high fevers  SUBJECTIVE: high fevers, worsening neurostatus  OBJECTIVE:  Temp:  [98.4 F (36.9 C)-104 F (40 C)] 102.8 F (39.3 C) (11/19 0900) Pulse Rate:  [45-99] 65  (11/19 0814) Resp:  [14-35] 21  (11/19 0814) BP: (110-179)/(77-116) 173/104 mmHg (11/19 0800) SpO2:  [99 %-100 %] 100 % (11/19 0814) FiO2 (%):  [28 %] 28 % (11/19 0804)   Intake/Output Summary (Last 24 hours) at 12/01/10 0917 Last data filed at 12/01/10 0900  Gross per 24 hour  Intake 2307.5 ml  Output   1976 ml  Net  331.5 ml   General - obese, GCS 4 ENT: - Pupils reactive err Cardiac RRR, Nl S1/S2, -M Chest - Coarse BS bilaterally Abd - soft, non-tender, ND and +BS Ext - no edema  Neuro - GCS 4. Gag +   Lab Results  Component Value Date   CREATININE 1.23 12/01/2010   BUN 35* 12/01/2010   NA 150* 12/01/2010   K 3.5 12/01/2010   CL 115* 12/01/2010   CO2 26 12/01/2010   CBC    Component Value Date/Time   WBC 10.2 12/01/2010 0530   RBC 3.88* 12/01/2010 0530   HGB 11.3* 12/01/2010 0530   HCT 35.8* 12/01/2010 0530   PLT 270 12/01/2010 0530   MCV 92.3 12/01/2010 0530   MCH 29.1 12/01/2010 0530   MCHC 31.6 12/01/2010 0530   RDW 15.1 12/01/2010 0530   LYMPHSABS 5.0* 11/19/2010 1715   MONOABS 0.7 11/19/2010 1715   EOSABS 0.3 11/19/2010 1715   BASOSABS 0.0 11/19/2010 1715   Lab Results  Component Value Date   TSH 0.895 11/20/2010   FreeT4>>1.82 11/20/2010   ASSESSMENT/PLAN:  1) Cerebellar Ischemic infract with ?  Hemorrhage transformation/Coma, now persistent vegetative state.  Off hypertonic saline per  neuro. Prognosis remains very poor.  No reasonable chance are recovery from a neurologic standpoint.  High fevers Plan:   - Per neuro and neurosurgery. - Neurosurg to address the ventric at this point and to reassess the need vs appropriateness for a VP shunt. Will add vanc, ceftaz for csf risk from infected drain Will call NS to collect culture from csf CT reviewed x 2 Will control fevers, may need to initiate cooling normothermia protocol, likely Cooling blanket and ice pachks, tylenal started , assess response  2) Acute resp failure: Due to inability to protect airway. Now GCS 4 PLAN gcs 4 , poor relaible drive, add vent back to [facility] TV 8 cc/kg abg to follow Last pcxr without infiltrates hold PS today, NO trach collar  3) A. Fib: Heparin stopped following cerebellar hemorrhage 11/21/10 now on lovenox. PLAN - Monitor.tele  Treat fevers  4) Hypertension: off cardene. PLAN: - PRN fentanyl for pain - Increased Norvasc to 10 mg po daily and now off cardene and tolerating it well. Will have insensible losses, NS  5) h/o CAD Plan: - Monitor  6) H/O Gout Plan: - Will need to resume allopurinol, colchicine when able to take oral medication. If flare, then steroids iv is DOC in house  7) [id number] Plan: - HCAP coverage on board - line new Send UA Concern is IVC at this duration being in  Add ceftaz, dc zosyn, add vanc back BC Csf culture needed Dc IVC when able pcxr in am   8) Thyroiditis Plan: - CT images d/w radiology with concerns for thyroid inflammation - F/U mild elevation in FT4, and low normal TSH>>will monitor for now Ensure T3 done  9) Acute renal failure, slight worsening of renal failure Plan: - Keep euvolemic. - Monitor BMP. With fevers, will have insenible losses , with rising crt, , re add NS at 50  With CT edema, dc free water Dc ACEI  10) Hypernatremia. New brain edema Plan: - No diuresis. - dc free water, see last 2 CT scans NS at 50     11) Hyperglycemia Plan: - SSI, increase lantus again     NO pEG with fever and course change  If requires arctic sun , will have to paralyze, sedate heavy etc.  Ccm time 50 min thus far Brother updated   FEINSTEIN,DANIEL J. 12/01/2010, 9:17 AM

## 2010-12-02 ENCOUNTER — Inpatient Hospital Stay (HOSPITAL_COMMUNITY): Payer: 59

## 2010-12-02 ENCOUNTER — Encounter (HOSPITAL_COMMUNITY)
Admission: EM | Disposition: A | Discharge status: Rehabilitation Facility | Payer: Self-pay | Source: Ambulatory Visit | Attending: Pulmonary Disease

## 2010-12-02 DIAGNOSIS — J9600 Acute respiratory failure, unspecified whether with hypoxia or hypercapnia: Secondary | ICD-10-CM

## 2010-12-02 DIAGNOSIS — I629 Nontraumatic intracranial hemorrhage, unspecified: Secondary | ICD-10-CM

## 2010-12-02 DIAGNOSIS — R4020 Unspecified coma: Secondary | ICD-10-CM

## 2010-12-02 DIAGNOSIS — R633 Feeding difficulties, unspecified: Secondary | ICD-10-CM

## 2010-12-02 DIAGNOSIS — I6340 Cerebral infarction due to embolism of unspecified cerebral artery: Secondary | ICD-10-CM

## 2010-12-02 LAB — COMPREHENSIVE METABOLIC PANEL
Albumin: 2.4 g/dL — ABNORMAL LOW (ref 3.5–5.2)
Alkaline Phosphatase: 65 U/L (ref 39–117)
BUN: 33 mg/dL — ABNORMAL HIGH (ref 6–23)
CO2: 26 mEq/L (ref 19–32)
Chloride: 114 mEq/L — ABNORMAL HIGH (ref 96–112)
Creatinine, Ser: 0.95 mg/dL (ref 0.50–1.35)
GFR calc non Af Amer: >90 mL/min (ref >90)
Potassium: 3.4 mEq/L — ABNORMAL LOW (ref 3.5–5.1)
Total Bilirubin: 0.3 mg/dL (ref 0.3–1.2)

## 2010-12-02 LAB — CBC
MCH: 29.6 pg (ref 26.0–34.0)
MCV: 92 fL (ref 78.0–100.0)
Platelets: 289 10*3/uL (ref 150–400)
RDW: 15.1 % (ref 11.5–15.5)
WBC: 10.6 10*3/uL — ABNORMAL HIGH (ref 4.0–10.5)

## 2010-12-02 LAB — DIFFERENTIAL
Basophils Absolute: 0.1 10*3/uL (ref 0.0–0.1)
Eosinophils Absolute: 0.3 10*3/uL (ref 0.0–0.7)
Eosinophils Relative: 3 % (ref 0–5)
Lymphocytes Relative: 25 % (ref 12–46)
Neutrophils Relative %: 62 % (ref 43–77)

## 2010-12-02 LAB — GLUCOSE, CAPILLARY
Glucose-Capillary: 183 mg/dL — ABNORMAL HIGH (ref 70–99)
Glucose-Capillary: 193 mg/dL — ABNORMAL HIGH (ref 70–99)
Glucose-Capillary: 208 mg/dL — ABNORMAL HIGH (ref 70–99)
Glucose-Capillary: 211 mg/dL — ABNORMAL HIGH (ref 70–99)

## 2010-12-02 LAB — SODIUM: Sodium: 149 mEq/L — ABNORMAL HIGH (ref 135–145)

## 2010-12-02 SURGERY — INSERTION, PEG TUBE
Anesthesia: Moderate Sedation

## 2010-12-02 MED ORDER — POTASSIUM CHLORIDE CRYS ER 20 MEQ PO TBCR: 40.0000 meq | EXTENDED_RELEASE_TABLET | Freq: Once | ORAL | Status: DC

## 2010-12-02 MED ORDER — PIVOT 1.5 CAL PO LIQD: 1000.0000 mL | ORAL | Status: AC

## 2010-12-02 MED ORDER — HYDRALAZINE HCL 20 MG/ML IJ SOLN
10.0000 mg | INTRAMUSCULAR | Status: DC | PRN
  Administered 2010-12-04 – 2010-12-05 (×2): 10 mg via INTRAVENOUS
  Administered 2010-12-06: 09:00:00 via INTRAVENOUS
  Administered 2010-12-07 – 2010-12-10 (×4): 10 mg via INTRAVENOUS
  Filled 2010-12-02 (×7): qty 1
  Filled 2010-12-02: qty 2
  Filled 2010-12-02: qty 1

## 2010-12-02 MED ORDER — CLONIDINE HCL 0.3 MG PO TABS
0.3000 mg | ORAL_TABLET | Freq: Three times a day (TID) | ORAL | Status: DC
  Administered 2010-12-02 – 2010-12-06 (×12): 0.3 mg via ORAL
  Filled 2010-12-02 (×14): qty 1

## 2010-12-02 MED ORDER — JEVITY 1.2 CAL PO LIQD
1000.0000 mL | ORAL | Status: DC
  Filled 2010-12-02 (×2): qty 1000

## 2010-12-02 MED ORDER — INSULIN GLARGINE 100 UNIT/ML ~~LOC~~ SOLN
20.0000 [IU] | Freq: Two times a day (BID) | SUBCUTANEOUS | Status: DC
  Administered 2010-12-02 – 2010-12-07 (×11): 20 [IU] via SUBCUTANEOUS
  Filled 2010-12-02 (×2): qty 3

## 2010-12-02 MED ORDER — POTASSIUM CHLORIDE 20 MEQ/15ML (10%) PO LIQD
40.0000 meq | Freq: Once | ORAL | Status: AC
  Administered 2010-12-02: 40 meq via ORAL
  Filled 2010-12-02: qty 30

## 2010-12-02 MED ORDER — PIVOT 1.5 CAL PO LIQD
1000.0000 mL | ORAL | Status: DC
  Administered 2010-12-02: 1000 mL via ORAL
  Filled 2010-12-02 (×2): qty 1000

## 2010-12-02 MED ORDER — DEXTROSE 5 % IV SOLN
1.0000 g | Freq: Once | INTRAVENOUS | Status: DC
  Filled 2010-12-02: qty 1

## 2010-12-02 NOTE — Progress Notes (Signed)
Stroke Team Progress Note  SUBJECTIVE [name] whosesymptoms of lethargy are gradually improving. Pt looks much better this am. Following commands. GI feels too sick to place PEG today.blood pressure is slightly limited. Temperature is now come down have to change of antibiotics. Neurosurgery yet to decide about discontinuing entriculostomy  OBJECTIVE Most recent Vital Signs: Temp: 99.3 F (37.4 C) (11/20 0000) Temp src: Oral (11/20 0000) BP: 168/142 mmHg (11/20 0700) Pulse Rate: 38  (11/20 0700) Respiratory Rate: 23 O2 Saturdation: 100%  CBG (last 3)   Basename 12/02/10 0410 12/02/10 0008 12/01/10 1945  GLUCAP 183* 211* 233*   Intake/Output from previous day: 11/19 0701 - 11/20 0700 In: 2799.2 [I.V.:1011.7; NG/GT:925; IV Piggyback:862.5] Out: 2453 [Urine:2215; Drains:38; Stool:200]  IV Fluid Intake:     . sodium chloride 50 mL/hr at 12/02/10 0700  . feeding supplement (PIVOT 1.5 CAL) 1,000 mL (12/01/10 2331)  . niCARDipine 5 mg/hr (11/28/10 0550)   Diet:   TF at 44ml/hr  Activity: Bedrest  DVT Prophylaxis:  SCDs   Studies: Results for orders placed during the hospital encounter of 11/19/10 (from the past 24 hour(s))  BLOOD GAS, ARTERIAL     Status: Abnormal   Collection Time   12/01/10  9:24 AM      Component Value Range   FIO2 50.00     Delivery systems VENTILATOR     Mode PRESSURE REGULATED VOLUME CONTROL     VT 570     Rate 14.0     Peep/cpap 5.0     pH, Arterial 7.485 (*) 7.350 - 7.450    pCO2 arterial 34.3 (*) 35.0 - 45.0 (mmHg)   pO2, Arterial 179.0 (*) 80.0 - 100.0 (mmHg)   Bicarbonate 25.6 (*) 20.0 - 24.0 (mEq/L)   TCO2 26.6  0 - 100 (mmol/L)   Acid-Base Excess 2.3 (*) 0.0 - 2.0 (mmol/L)   O2 Saturation 99.6     Patient temperature 98.6     Collection site RIGHT RADIAL     Drawn by [id number]     Sample type ARTERIAL DRAW     Allens test (pass/fail) PASS  PASS   LACTIC ACID, PLASMA     Status: Normal   Collection Time   12/01/10 10:36 AM      Component Value Range   Lactic Acid, Venous 1.3  0.5 - 2.2 (mmol/L)  PROCALCITONIN     Status: Normal   Collection Time   12/01/10 10:37 AM      Component Value Range   Procalcitonin 0.17    CSF CULTURE     Status: Normal (Preliminary result)   Collection Time   12/01/10 10:59 AM      Component Value Range   Specimen Description FLUID CSF     Special Requests FLUID FROM THE SHUNT     Gram Stain       Value: NO WBC SEEN     NO ORGANISMS SEEN   Culture PENDING     Report Status PENDING    SODIUM     Status: Abnormal   Collection Time   12/01/10 11:00 AM      Component Value Range   Sodium 150 (*) 135 - 145 (mEq/L)  T3, FREE     Status: Abnormal   Collection Time   12/01/10 11:00 AM      Component Value Range   T3, Free 1.7 (*) 2.3 - 4.2 (pg/mL)  URINALYSIS, ROUTINE W REFLEX MICROSCOPIC  Status: Abnormal   Collection Time   12/01/10 11:28 AM      Component Value Range   Color, Urine YELLOW  YELLOW    Appearance CLEAR  CLEAR    Specific Gravity, Urine 1.037 (*) 1.005 - 1.030    pH 5.0  5.0 - 8.0    Glucose, UA NEGATIVE  NEGATIVE (mg/dL)   Hgb urine dipstick NEGATIVE  NEGATIVE    Bilirubin Urine NEGATIVE  NEGATIVE    Ketones, ur NEGATIVE  NEGATIVE (mg/dL)   Protein, ur 528 (*) NEGATIVE (mg/dL)   Urobilinogen, UA 1.0  0.0 - 1.0 (mg/dL)   Nitrite NEGATIVE  NEGATIVE    Leukocytes, UA NEGATIVE  NEGATIVE   URINE MICROSCOPIC-ADD ON     Status: Abnormal   Collection Time   12/01/10 11:28 AM      Component Value Range   Squamous Epithelial / LPF RARE  RARE    WBC, UA 0-2  <3 (WBC/hpf)   RBC / HPF 0-2  <3 (RBC/hpf)   Bacteria, UA RARE  RARE    Casts GRANULAR CAST (*) NEGATIVE   CULTURE, RESPIRATORY     Status: Normal (Preliminary result)   Collection Time   12/01/10 12:12 PM      Component Value Range   Specimen Description TRACHEAL ASPIRATE     Special Requests NONE     Gram Stain       Value: FEW WBC PRESENT, PREDOMINANTLY PMN     RARE  SQUAMOUS EPITHELIAL CELLS PRESENT     RARE GRAM NEGATIVE RODS   Culture PENDING     Report Status PENDING    OSMOLALITY, URINE     Status: Normal   Collection Time   12/01/10  5:03 PM      Component Value Range   Osmolality, Ur 908  390 - 1090 (mOsm/kg)  SODIUM     Status: Abnormal   Collection Time   12/01/10  6:20 PM      Component Value Range   Sodium 151 (*) 135 - 145 (mEq/L)  SODIUM     Status: Abnormal   Collection Time   12/02/10 12:15 AM      Component Value Range   Sodium 149 (*) 135 - 145 (mEq/L)  COMPREHENSIVE METABOLIC PANEL     Status: Abnormal   Collection Time   12/02/10  5:45 AM      Component Value Range   Sodium 149 (*) 135 - 145 (mEq/L)   Potassium 3.4 (*) 3.5 - 5.1 (mEq/L)   Chloride 114 (*) 96 - 112 (mEq/L)   CO2 26  19 - 32 (mEq/L)   Glucose, Bld 202 (*) 70 - 99 (mg/dL)   BUN 33 (*) 6 - 23 (mg/dL)   Creatinine, Ser 4.13  0.50 - 1.35 (mg/dL)   Calcium 9.9  8.4 - 24.4 (mg/dL)   Total Protein 7.5  6.0 - 8.3 (g/dL)   Albumin 2.4 (*) 3.5 - 5.2 (g/dL)   AST 22  0 - 37 (U/L)   ALT 24  0 - 53 (U/L)   Alkaline Phosphatase 65  39 - 117 (U/L)   Total Bilirubin 0.3  0.3 - 1.2 (mg/dL)   GFR calc non Af Amer >90  >90 (mL/min)   GFR calc Af Amer >90  >90 (mL/min)  CBC     Status: Abnormal   Collection Time   12/02/10  5:45 AM      Component Value Range   WBC 10.6 (*) 4.0 - 10.5 (  K/uL)   RBC 3.89 (*) 4.22 - 5.81 (MIL/uL)   Hemoglobin 11.5 (*) 13.0 - 17.0 (g/dL)   HCT 40.9 (*) 81.1 - 52.0 (%)   MCV 92.0  78.0 - 100.0 (fL)   MCH 29.6  26.0 - 34.0 (pg)   MCHC 32.1  30.0 - 36.0 (g/dL)   RDW 91.4  78.2 - 95.6 (%)   Platelets 289  150 - 400 (K/uL)  DIFFERENTIAL     Status: Normal   Collection Time   12/02/10  5:45 AM      Component Value Range   Neutrophils Relative 62  43 - 77 (%)   Neutro Abs 6.6  1.7 - 7.7 (K/uL)   Lymphocytes Relative 25  12 - 46 (%)   Lymphs Abs 2.6  0.7 - 4.0 (K/uL)   Monocytes Relative 9  3 - 12 (%)   Monocytes Absolute 1.0  0.1 -  1.0 (K/uL)   Eosinophils Relative 3  0 - 5 (%)   Eosinophils Absolute 0.3  0.0 - 0.7 (K/uL)   Basophils Relative 1  0 - 1 (%)   Basophils Absolute 0.1  0.0 - 0.1 (K/uL)     Ct Portable Head W/o Cm  12/01/2010  *RADIOLOGY REPORT*  Clinical Data: Follow-up bleed, drain.  CT HEAD WITHOUT CONTRAST  Technique:  Contiguous axial images were obtained from the base of the skull through the vertex without contrast.  Comparison: 11/30/2010  Findings: Right transfrontal approach intraventricular catheter with the tip near the midline, unchanged in position.  Mild lateral and third ventricular prominence is unchanged.  There is a layering blood within the occipital horns of the lateral ventricles. Blood also collects within the slit-like fourth ventricle. Intraparenchymal hemorrhage centered within the right cerebellum has increased slightly in size, now measuring up to 2 cm in maximum diameter.  Furthermore, there is increased surrounding edema. Again, effacement of the basilar cisterns likely reflects upward transtentorial herniation.  The cerebellar tonsils demonstrate mild downward herniation as well.  Right sided NG tube. Small amount high attenuation along the right tentorium.  No new areas of parenchymal hemorrhage.  White matter hypodensities.  Global edema. No acute osseous change.  IMPRESSION: Right cerebellar intraparenchymal hemorrhage measures slightly larger at 2 cm.  Increased surrounding edema.  Persistent upward transtentorial herniation, effacement of the fourth ventricle, and mild downward tonsillar herniation.  Right transfrontal approach intraventricular catheter.  There is persistent prominence of the lateral ventricles and third ventricle with layering hemorrhage within the occipital horns of the lateral ventricles.  Original Report Authenticated By: Waneta Martins, M.D.    Physical Exam:  Patient is off vent and on  trach collar, not on any sedation. His is more alert, following simple  commands. Shows 2 fingers. He has slight left gaze deviation.he can follow case in all directions today. He has mild saccadic dysmetria horizontally. He has a weak cough and gag reflex. Today he can move all 4 extremities to commands and wriggle his toes and fingers purposefully.   Both plantars are upgoing.  Cardiac exam rapid heart sounds no murmurs. Lungs are clear to auscultation but has coarse conducted sounds.   ASSESSMENT [name] with a left-sided weakness and dizziness due to a right hemispheric right brain cardioembolic infarct secondary to atrial fibrillation. Post hospitalization hemorrhage. Has had tracheostomy and doing well on trach collar but febrile last 2 days and Ventriculostomy being weaned by Dr Blanche East. Placed back on vent yest,  off this am, more alert.  Stroke risk factors:  atrial fibrillation, diabetes mellitus, hyperlipidemia and CAD  Hospital day # 13  TREATMENT/PLAN PEG on hold, though pt looks better. Need GI to re-look at timing for PEG.   tx DBP > 120, stop 3% protocol, check na qd. Discuss with the patient's brother and family members, Dr. Tyson Alias and answered questions.This patient is critically ill and at significant risk of neurological worsening, death and care requires constant monitoring of vital signs, hemodynamics,respiratory and cardiac monitoring, neurological assessment, discussion with family, other specialists and medical decision making of high complexity. I spent 30 minutes of neurocritical care time  in the care of  this patient.   Joaquin Music, ANP-BC, GNP-BC [facility] Pager: [phone] 12/02/2010 7:42 AM  Dr. Delia Heady, Stroke Center Medical Director, has personally reviewed chart, pertinent data, examined the patient and developed the plan of care.

## 2010-12-02 NOTE — Progress Notes (Addendum)
Trach sutures removed per Dr. Tyson Alias.  Patient tolerated procedure well.  Dressing applied.  Trach secured with ties, and in place.  RT will continue to monitor.

## 2010-12-02 NOTE — Progress Notes (Signed)
Dr. Tyson Alias called and says [name] is now afebrile, back to non acute baseline and ready for PEG. I arranged PEG for tomorrow at 3PM, at bedside. Orders, including Zosyn preop,  entered.

## 2010-12-02 NOTE — Progress Notes (Signed)
Subjective: On trach collar  Objective: Vital signs in last 24 hours: Temp:  [98.3 F (36.8 C)-102.3 F (39.1 C)] 98.6 F (37 C) (11/20 0900) Pulse Rate:  [38-116] 80  (11/20 0800) Resp:  [14-25] 22  (11/20 0800) BP: (84-177)/(48-142) 177/138 mmHg (11/20 0800) SpO2:  [100 %] 100 % (11/20 0800) FiO2 (%):  [28 %-50 %] 28 % (11/20 0800) Weight:  [97.3 kg (214 lb 8.1 oz)] 214 lb 8.1 oz (97.3 kg) (11/20 0400)  Intake/Output from previous day: 11/19 0701 - 11/20 0700 In: 2799.2 [I.V.:1011.7; NG/GT:925; IV Piggyback:862.5] Out: 2453 [Urine:2215; Drains:38; Stool:200] Intake/Output this shift: Total I/O In: 115 [I.V.:50; NG/GT:65] Out: 0   neuro - eyes open, nods to questions - FC bilat  16 cc csf last 24 hours  Lab Results:  Basename 12/02/10 0545 12/01/10 0530  WBC 10.6* 10.2  HGB 11.5* 11.3*  HCT 35.8* 35.8*  PLT 289 270   BMET  Basename 12/02/10 0545 12/02/10 0015 12/01/10 0530  NA 149* 149* --  K 3.4* -- 3.5  CL 114* -- 115*  CO2 26 -- 26  GLUCOSE 202* -- 184*  BUN 33* -- 35*  CREATININE 0.95 -- 1.23  CALCIUM 9.9 -- 9.7    Studies/Results:           Specimen Description  FLUID CSF    Special Requests  FLUID FROM THE SHUNT    Gram Stain  NO WBC SEEN NO ORGANISMS SEEN    Culture  PENDING    Report Status  PENDING    Resulting Agency  [facility]     Specimen Collected: 12/01/10 10:59 AM  Last Resulted: 12/02/10 3:28 AM             Assessment/Plan: Pt much improved , almost no csf drainage for 2 days - and csf gram stain negative - ventriculostomy removed.    Please recall if we can be of further assistance  LOS: 13 days     Kenosha Doster R, MD 12/02/2010, 9:32 AM

## 2010-12-02 NOTE — Progress Notes (Signed)
CSW in contact with pt family to provide support and address needs. CSW continuing to review chart and follow for possible d/c planning needs.  Baxter Flattery, MSW [phone]

## 2010-12-02 NOTE — Progress Notes (Signed)
eLink Physician-Brief Progress Note Patient Name: [name] DOB: 01-22-1955 MRN: [id number]  Date of Service  12/02/2010   HPI/Events of Note  hypokalemia   eICU Interventions  Potassium replaced     [name] 12/02/2010, 6:54 AM

## 2010-12-02 NOTE — Progress Notes (Signed)
[name] is a 55 y.o. male former smoker admitted on 11/19/2010 with acute onset of slurred speech and Lt sided weakness. Dx Cerebekllar ischemic cva then converted to bleed. PMHx DM, CAD, CHF, HTN, Gout, A fib.  Line/tubes: ETT 11/7>>11/9, 11/10 (cerebellar hge) >>11/16 Trach (ENT) 11/16>>> Ventric 11/10 (dr Hrisch emergent) >>>11/20 R [place] TLC 11/7>>>11/17 R PICC line 11/17>>>  Cx: Blood 11/8>>NTD Sputum 11/8>>Moderate Staph aureus. Blood 11/12>>>NTD Urine 11/12>>>NTD Sputum 11/12: abundant GPC pairs/abundant WBC>>>NF 11/19 BC x 2 (fever104) Csf 11/19>>>gram stain neg>>>  Abx: Rocephin 11/8>>11/12 Zithromax 11/8>>11/12 Zosyn 11/12>>>11/19 Vancomycin 11/12>>>11/16 vanc ( fever 104, ivc) 11/19>>> ceftaz 11/19 (CSF ) 11/19>>>  Best practice: Pepcid Heparin gtt/coumadin for A fib >>  stopped 11/19   Consults: Neuro Cardiology [name]) [name]>>>11/20  Tests/events: 11/7: CT head>>negative 11/7: CT chest>>b/l GGO more at bases, small b/l effusions, no PE, dependent ATX 11/8: MRI head w/o contrast>>Acute/subacute non-hemorrhagic infarct Rt superior cerebellar artery territory, remote Lt cerebellar infarct, multiple punctated areas of remote hemorrhage compatible with amyloid angiopathy 11/8: Echo>>severe LVH, EF 25 to 35%, mild AR, mild MR, PAS 11/10 - CT head am: marked hgic transformation of rt cerebaellar infarct -> supratentorial hydrocephalus -> ventric drain 11/10 - GCS 5 then reintubated. 11/18 CT head>>>Right cerebellar infarct with improving hemorrhage. Associated<BR>cerebellar edema with effacement of the 4th ventricle and upward<BR>transtentorial herniation, unchanged.<BR> <BR>Right frontal approach ventriculostomy catheter with<BR>ventriculomegaly, stable versus mildly improved.<BR> <BR>Layering hemorrhage in the posterior horns of the lateral<BR>ventricles, mildly increased.<BR> 11/19 CT head>>>Right cerebellar intraparenchymal hemorrhage measures  slightly<BR>larger at 2 cm. Increased surrounding edema. Persistent upward<BR>transtentorial herniation, effacement of the fourth ventricle, and<BR>mild downward tonsillar herniation.<BR> <BR>Right transfrontal approach intraventricular catheter. There is<BR>persistent prominence of the lateral ventricles and third ventricle<BR>with layering hemorrhage within the occipital horns of the lateral<BR>ventricles.<BR>> 11/19 - high fevers 11/20- improved fever and neurostatus  SUBJECTIVE: high fevers, worsening neurostatus  OBJECTIVE:  Temp:  [98.3 F (36.8 C)-101.1 F (38.4 C)] 98.6 F (37 C) (11/20 0900) Pulse Rate:  [38-116] 73  (11/20 1000) Resp:  [14-26] 26  (11/20 1000) BP: (84-183)/(48-143) 183/143 mmHg (11/20 1000) SpO2:  [100 %] 100 % (11/20 1000) FiO2 (%):  [28 %-40.1 %] 28 % (11/20 1000) Weight:  [97.3 kg (214 lb 8.1 oz)] 214 lb 8.1 oz (97.3 kg) (11/20 0400)   Intake/Output Summary (Last 24 hours) at 12/02/10 1110 Last data filed at 12/02/10 1000  Gross per 24 hour  Intake 2851.67 ml  Output   2006 ml  Net 845.67 ml   General - obese more awake, follows commands, improved ENT: - Pupils reactive err Cardiac RRR, Nl S1/S2, -M Chest - Coarse BS bilaterally improved Abd - soft, non-tender, ND and +BS Ext - no edema  Neuro - awake, follows commands  Lab Results  Component Value Date   CREATININE 0.95 12/02/2010   BUN 33* 12/02/2010   NA 149* 12/02/2010   K 3.4* 12/02/2010   CL 114* 12/02/2010   CO2 26 12/02/2010   CBC    Component Value Date/Time   WBC 10.6* 12/02/2010 0545   RBC 3.89* 12/02/2010 0545   HGB 11.5* 12/02/2010 0545   HCT 35.8* 12/02/2010 0545   PLT 289 12/02/2010 0545   MCV 92.0 12/02/2010 0545   MCH 29.6 12/02/2010 0545   MCHC 32.1 12/02/2010 0545   RDW 15.1 12/02/2010 0545   LYMPHSABS 2.6 12/02/2010 0545   MONOABS 1.0 12/02/2010 0545   EOSABS 0.3 12/02/2010 0545   BASOSABS 0.1 12/02/2010 0545   Lab  Results  Component Value Date   TSH  0.895 11/20/2010   FreeT4>>1.82 11/20/2010   ASSESSMENT/PLAN:  1) Cerebellar Ischemic infract with ? Hemorrhage transformation/Coma, now persistent vegetative state.  Off hypertonic saline per neuro. Prognosis remains very poor.  No reasonable chance are recovery from a neurologic standpoint.  High fevers Plan:   - csf sent, drain out Improved 3% per neurology Chem in am for Na will recall GI for PEG Fever controlled  2) Acute resp failure: Due to inability to protect airway 11/19 Improved 11/20, now follows commands PLAN: Back to trach collar goal 12 hours then noctrunal rest tonight only pcxr no infiltrates Fever improved Will plan speech  / eval in am if successful TC 12 hrs   3) A. Fib: Heparin stopped following cerebellar hemorrhage 11/21/10 now on lovenox. PLAN - Monitor.tele  Treat fevers, done  4) Hypertension: off cardene. PLAN: - PRN fentanyl for pain - Increased Norvasc to 10 mg po daily, continue coreg Shortage labetolol Add prn hydralazine Escalate clonodine 0.3  5) h/o CAD Plan: - Monitor  6) H/O Gout Plan: If flare, then steroids iv is DOC in house  7) [id number] Plan: resolved - HCAP coverage on board - line new Send UA - neg Concern is IVC at this duration being in, csf sent, gram stain neg, follow offical final culture  Continue ceftaz, vanc BC follow Narrow abx in am , may be neuro related   8) Thyroiditis Plan: - CT images d/w radiology with concerns for thyroid inflammation - F/U mild elevation in FT4, and low normal TSH>>will monitor for now Ensure T3 done -- low ?? Sick euthyroid  9) Acute renal failure, improved slight Plan: -  Dc ACEI, continue to dc this Chem in am   10) Hypernatremia. New brain edema Plan 3 % per Neurology   11) Hyperglycemia Plan: - SSI, increase lantus again today 20 bid   Will call back GI for PEG  Avoided arctic sun      Gustava Berland J. 12/02/2010, 11:10 AM

## 2010-12-03 ENCOUNTER — Inpatient Hospital Stay (HOSPITAL_COMMUNITY): Payer: 59

## 2010-12-03 ENCOUNTER — Encounter (HOSPITAL_COMMUNITY)
Admission: EM | Disposition: A | Discharge status: Rehabilitation Facility | Payer: Self-pay | Source: Ambulatory Visit | Attending: Pulmonary Disease

## 2010-12-03 ENCOUNTER — Encounter (HOSPITAL_COMMUNITY): Payer: Self-pay | Admitting: Otolaryngology

## 2010-12-03 HISTORY — PX: PEG PLACEMENT: SHX5437

## 2010-12-03 LAB — CULTURE, RESPIRATORY W GRAM STAIN

## 2010-12-03 LAB — BLOOD GAS, ARTERIAL
Drawn by: 35135
O2 Content: 28 L/min
TCO2: 25.4 mmol/L (ref 0–100)
pCO2 arterial: 32.2 mmHg — ABNORMAL LOW (ref 35.0–45.0)
pH, Arterial: 7.491 — ABNORMAL HIGH (ref 7.350–7.450)

## 2010-12-03 LAB — BASIC METABOLIC PANEL
Calcium: 10 mg/dL (ref 8.4–10.5)
GFR calc non Af Amer: >90 mL/min (ref >90)
Glucose, Bld: 248 mg/dL — ABNORMAL HIGH (ref 70–99)
Sodium: 148 mEq/L — ABNORMAL HIGH (ref 135–145)

## 2010-12-03 LAB — GLUCOSE, CAPILLARY: Glucose-Capillary: 138 mg/dL — ABNORMAL HIGH (ref 70–99)

## 2010-12-03 LAB — CLOSTRIDIUM DIFFICILE BY PCR: Toxigenic C. Difficile by PCR: NEGATIVE

## 2010-12-03 SURGERY — INSERTION, PEG TUBE
Anesthesia: Moderate Sedation | Site: Abdomen | Wound class: Clean

## 2010-12-03 MED ORDER — SODIUM CHLORIDE 0.9 % IJ SOLN
10.0000 mL | INTRAMUSCULAR | Status: DC | PRN
  Administered 2010-12-03: 20 mL
  Administered 2010-12-03 – 2010-12-07 (×4): 10 mL
  Filled 2010-12-03: qty 10
  Filled 2010-12-03: qty 20

## 2010-12-03 MED ORDER — FENTANYL CITRATE 0.05 MG/ML IJ SOLN
INTRAMUSCULAR | Status: DC | PRN
  Administered 2010-12-03: 25 ug via INTRAVENOUS

## 2010-12-03 MED ORDER — MIDAZOLAM HCL 10 MG/2ML IJ SOLN
INTRAMUSCULAR | Status: DC | PRN
  Administered 2010-12-03: 2 mg via INTRAVENOUS

## 2010-12-03 MED ORDER — BACITRACIN-NEOMYCIN-POLYMYXIN OINTMENT TUBE
1.0000 "application " | TOPICAL_OINTMENT | Freq: Every day | CUTANEOUS | Status: DC
  Administered 2010-12-03 – 2010-12-05 (×3): 1 via TOPICAL
  Filled 2010-12-03 (×4): qty 1

## 2010-12-03 MED ORDER — JEVITY 1.2 CAL PO LIQD
1000.0000 mL | ORAL | Status: DC
  Administered 2010-12-04 – 2010-12-05 (×2): 1000 mL
  Filled 2010-12-03 (×4): qty 1000

## 2010-12-03 MED ORDER — ALTEPLASE 2 MG IJ SOLR
2.0000 mg | Freq: Once | INTRAMUSCULAR | Status: DC
  Administered 2010-12-03: 2 mg

## 2010-12-03 MED ORDER — SODIUM CHLORIDE 0.9 % IJ SOLN: 10.0000 mL | INTRAMUSCULAR | Status: DC | PRN

## 2010-12-03 MED ORDER — SODIUM CHLORIDE 0.9 % IJ SOLN
10.0000 mL | Freq: Two times a day (BID) | INTRAMUSCULAR | Status: DC
  Administered 2010-12-03 – 2010-12-22 (×37): 10 mL
  Filled 2010-12-03: qty 20
  Filled 2010-12-03: qty 10
  Filled 2010-12-03: qty 40
  Filled 2010-12-03: qty 10

## 2010-12-03 MED ORDER — SODIUM CHLORIDE 0.9 % IJ SOLN: 10.0000 mL | Freq: Two times a day (BID) | INTRAMUSCULAR | Status: DC

## 2010-12-03 MED ORDER — ALTEPLASE 2 MG IJ SOLR
2.0000 mg | Freq: Once | INTRAMUSCULAR | Status: AC
  Administered 2010-12-03: 2 mg
  Filled 2010-12-03 (×2): qty 2

## 2010-12-03 NOTE — Progress Notes (Signed)
CSW initiated conversation with pt family regarding need for rehabilitation from illness after hospitalization. Pt family states they will want pt placed in [place] part [place] the state, near their area of residence in [place]. CSW will await further details on pt progress and plan to initiate SNF search early next week.  Baxter Flattery, MSW [phone]

## 2010-12-03 NOTE — Progress Notes (Signed)
[name] is a 55 y.o. male former smoker admitted on 11/19/2010 with acute onset of slurred speech and Lt sided weakness. Dx Cerebekllar ischemic cva then converted to bleed. PMHx DM, CAD, CHF, HTN, Gout, A fib.  Line/tubes: ETT 11/7>>11/9, 11/10 (cerebellar hge) >>11/16 Trach (ENT) 11/16>>> Ventric 11/10 (dr Hrisch emergent) >>>11/20 R [place] TLC 11/7>>>11/17 R PICC line 11/17>>>  Cx: Blood 11/8>>NTD Sputum 11/8>>Moderate Staph aureus. Blood 11/12>>>NTD Urine 11/12>>>NTD Sputum 11/12: abundant GPC pairs/abundant WBC>>>NF 11/19 BC x 2 (fever104) Csf 11/19>>>gram stain neg>>>  Abx: Rocephin 11/8>>11/12 Zithromax 11/8>>11/12 Zosyn 11/12>>>11/19 Vancomycin 11/12>>>11/16 vanc ( fever 104, ivc) 11/19>>> ceftaz 11/19 (CSF ) 11/19>>>  Best practice: Pepcid Heparin gtt/coumadin for A fib >>  stopped 11/19   Consults: Neuro Cardiology [name]) [name]>>>11/20  Tests/events: 11/7: CT head>>negative 11/7: CT chest>>b/l GGO more at bases, small b/l effusions, no PE, dependent ATX 11/8: MRI head w/o contrast>>Acute/subacute non-hemorrhagic infarct Rt superior cerebellar artery territory, remote Lt cerebellar infarct, multiple punctated areas of remote hemorrhage compatible with amyloid angiopathy 11/8: Echo>>severe LVH, EF 25 to 35%, mild AR, mild MR, PAS 11/10 - CT head am: marked hgic transformation of rt cerebaellar infarct -> supratentorial hydrocephalus -> ventric drain 11/10 - GCS 5 then reintubated. 11/18 CT head>>>Right cerebellar infarct with improving hemorrhage. Associated<BR>cerebellar edema with effacement of the 4th ventricle and upward<BR>transtentorial herniation, unchanged.<BR> <BR>Right frontal approach ventriculostomy catheter with<BR>ventriculomegaly, stable versus mildly improved.<BR> <BR>Layering hemorrhage in the posterior horns of the lateral<BR>ventricles, mildly increased.<BR> 11/19 CT head>>>Right cerebellar intraparenchymal hemorrhage measures  slightly<BR>larger at 2 cm. Increased surrounding edema. Persistent upward<BR>transtentorial herniation, effacement of the fourth ventricle, and<BR>mild downward tonsillar herniation.<BR> <BR>Right transfrontal approach intraventricular catheter. There is<BR>persistent prominence of the lateral ventricles and third ventricle<BR>with layering hemorrhage within the occipital horns of the lateral<BR>ventricles.<BR>> 11/19 - high fevers 11/20- improved fever and neurostatus  SUBJECTIVE: fevers contoled, trach collar, more awake  OBJECTIVE:  Temp:  [98.1 F (36.7 C)-99.4 F (37.4 C)] 98.3 F (36.8 C) (11/21 0800) Pulse Rate:  [39-104] 62  (11/21 1100) Resp:  [14-28] 15  (11/21 1100) BP: (83-180)/(59-128) 101/82 mmHg (11/21 1100) SpO2:  [100 %] 100 % (11/21 1100) FiO2 (%):  [28 %-30.4 %] 28 % (11/21 0900) Weight:  [98.6 kg (217 lb 6 oz)] 217 lb 6 oz (98.6 kg) (11/21 0500)   Intake/Output Summary (Last 24 hours) at 12/03/10 1202 Last data filed at 12/03/10 1101  Gross per 24 hour  Intake 1690.83 ml  Output   2125 ml  Net -434.17 ml   General - obese more awake compared to day prior ENT: - Pupils reactive err Cardiac RRR, Nl S1/S2, -M Chest - Coarse BS bilaterally improved Abd - soft, non-tender, ND and +BS Ext - no edema  Neuro - awake, follows commands  Lab Results  Component Value Date   CREATININE 0.96 12/03/2010   BUN 33* 12/03/2010   NA 148* 12/03/2010   K 3.9 12/03/2010   CL 113* 12/03/2010   CO2 27 12/03/2010   CBC    Component Value Date/Time   WBC 10.6* 12/02/2010 0545   RBC 3.89* 12/02/2010 0545   HGB 11.5* 12/02/2010 0545   HCT 35.8* 12/02/2010 0545   PLT 289 12/02/2010 0545   MCV 92.0 12/02/2010 0545   MCH 29.6 12/02/2010 0545   MCHC 32.1 12/02/2010 0545   RDW 15.1 12/02/2010 0545   LYMPHSABS 2.6 12/02/2010 0545   MONOABS 1.0 12/02/2010 0545   EOSABS 0.3 12/02/2010 0545   BASOSABS 0.1 12/02/2010 0545  Lab Results  Component Value Date   TSH 0.895  11/20/2010   FreeT4>>1.82 11/20/2010   ASSESSMENT/PLAN:  1) Cerebellar Ischemic infract with ? Hemorrhage transformation/Coma, now persistent vegetative state.  Off hypertonic saline per neuro. Prognosis remains very poor.  No reasonable chance are recovery from a neurologic standpoint.  High fevers all resolved and associated with edema Plan:   - csf sent, drain out, remains neg Improved  3% per neurology, now off Chem in am for Na will recall GI for PEG, hope place today Fever controlled  2) Acute resp failure: Due to inability to protect airway 11/19 Improved 11/20, now follows commands PLAN: Back to trach collar goal 24 hrs abg at 2000 to ensure safe to continue 24 hrs pcxr no infiltrates   3) A. Fib: Heparin stopped following cerebellar hemorrhage 11/21/10 now on lovenox. PLAN - Monitor.tele  Treat fevers, done  4) Hypertension: PLAN: - PRN fentanyl for pain - Increased Norvasc to 10 mg po daily, continue coreg Shortage labetolol Add prn hydralazine Escalate clonodine 0.3 Improvd with this increase, some variability  5) h/o CAD Plan: - Monitor  6) H/O Gout Plan: If flare, then steroids iv is DOC in house  7) [id number] Plan: resolved No infiltrate, UA neg, no source  From prior, dc vanc, ceftaz Assess cdiff with increase stool, low threshold add flagyl empiric  8) Thyroiditis Plan: - CT images d/w radiology with concerns for thyroid inflammation - F/U mild elevation in FT4, and low normal TSH>>will monitor for now Ensure T3 done -- low ?? Sick euthyroid  9) Acute renal failure, improved slight Plan: -  Dc ACEI, continue to dc this Chem in am   10) Hypernatremia. New brain edema Plan 3 % per Neurology, now off   11) Hyperglycemia Plan: - SSI, increase lantus again today 20 bid  If able to do 24 hrs trach collar, move to sdu in am  Hope pEG today  Mcarthur Rossetti. Tyson Alias, MD, FACP Pgr: [phone] [facility]      Nelda Bucks. 12/03/2010, 12:02 PM

## 2010-12-03 NOTE — Op Note (Signed)
Placement of PEG without complications. 66F BS PUSH gastrostomy placed in upper abdomen, orders written

## 2010-12-03 NOTE — Progress Notes (Signed)
Stroke Team Progress Note  SUBJECTIVE [name] who feels their symptoms of drowsiness are gradually improving. RN reports loose watery stools. IVC removed yesterday.neurological exam is improved with the patient now easily arousable following commands and being interactive. He has been moving all 4 extremities voluntary to commands.he is on trach collar this morning but was rested on the ventilator overnight. Plan is to have peg tube placed later this afternoon  OBJECTIVE Most recent Vital Signs: Temp: 98.1 F (36.7 C) (11/21 0349) Temp src: Oral (11/21 0349) BP: 178/123 mmHg (11/21 0600) Pulse Rate: 94  (11/21 0600) Respiratory Rate: 16 O2 Saturdation: 100%  CBG (last 3)   Basename 12/03/10 0348 12/02/10 2318 12/02/10 1934  GLUCAP 229* 242* 208*   Intake/Output from previous day: 11/20 0701 - 11/21 0700 In: 2335 [I.V.:1100; NG/GT:885; IV Piggyback:350] Out: 1950 [Urine:1950]  IV Fluid Intake:     . sodium chloride 50 mL/hr at 12/03/10 0500  . feeding supplement (PIVOT 1.5 CAL)    . niCARDipine 5 mg/hr (11/28/10 0550)  . DISCONTD: feeding supplement (JEVITY 1.2)    . DISCONTD: feeding supplement (PIVOT 1.5 CAL) 1,000 mL (12/01/10 2331)  . DISCONTD: feeding supplement (PIVOT 1.5 CAL) 1,000 mL (12/02/10 1029)   Diet:   NPO for PEG placement today  Activity:  Bedrest  DVT Prophylaxis:  SCDs   Studies: Results for orders placed during the hospital encounter of 11/19/10 (from the past 24 hour(s))  GLUCOSE, CAPILLARY     Status: Abnormal   Collection Time   12/02/10  8:54 AM      Component Value Range   Glucose-Capillary 193 (*) 70 - 99 (mg/dL)  GLUCOSE, CAPILLARY     Status: Abnormal   Collection Time   12/02/10 12:01 PM      Component Value Range   Glucose-Capillary 211 (*) 70 - 99 (mg/dL)   Comment 1 Notify RN     Comment 2 Documented in Chart    GLUCOSE, CAPILLARY     Status: Abnormal   Collection Time   12/02/10  4:02 PM   Component Value Range   Glucose-Capillary 204 (*) 70 - 99 (mg/dL)  GLUCOSE, CAPILLARY     Status: Abnormal   Collection Time   12/02/10  7:34 PM      Component Value Range   Glucose-Capillary 208 (*) 70 - 99 (mg/dL)  GLUCOSE, CAPILLARY     Status: Abnormal   Collection Time   12/02/10 11:18 PM      Component Value Range   Glucose-Capillary 242 (*) 70 - 99 (mg/dL)  GLUCOSE, CAPILLARY     Status: Abnormal   Collection Time   12/03/10  3:48 AM      Component Value Range   Glucose-Capillary 229 (*) 70 - 99 (mg/dL)  BASIC METABOLIC PANEL     Status: Abnormal   Collection Time   12/03/10  5:00 AM      Component Value Range   Sodium 148 (*) 135 - 145 (mEq/L)   Potassium 3.9  3.5 - 5.1 (mEq/L)   Chloride 113 (*) 96 - 112 (mEq/L)   CO2 27  19 - 32 (mEq/L)   Glucose, Bld 248 (*) 70 - 99 (mg/dL)   BUN 33 (*) 6 - 23 (mg/dL)   Creatinine, Ser 1.61  0.50 - 1.35 (mg/dL)   Calcium 09.6  8.4 - 10.5 (mg/dL)   GFR calc non Af Amer >90  >90 (mL/min)   GFR calc Af  Amer >90  >90 (mL/min)  MAGNESIUM     Status: Normal   Collection Time   12/03/10  5:00 AM      Component Value Range   Magnesium 2.3  1.5 - 2.5 (mg/dL)     Dg Chest Port 1 View  12/02/2010  *RADIOLOGY REPORT*  Clinical Data: Check ET tube  PORTABLE CHEST - 1 VIEW  Comparison: 11/30/2010  Findings: Borderline cardiomegaly again noted.  No pulmonary edema. Tracheostomy tube in place noted.  NG tube in place.  Mild left basilar atelectasis.  No diagnostic pneumothorax.  IMPRESSION: Tracheostomy tube in place.  No diagnostic pneumothorax.  Mild basilar atelectasis.  No pulmonary edema.  Original Report Authenticated By: Natasha Mead, M.D.    Physical Exam:   Middle-aged [place] American gentleman tested. distress. He is on trach collar.lungs are clear to auscultation. Cardiac exam normal heart sounds no murmur or gallop.  Neurological exam : awake follows commands well. Height movements are full range with end gaze nystagmus on  horizontal gaze. Face is symmetric without weakness. Tongue is midline. Motor system exam reveals [employer] moments in all 4 limbs distally but significant weakness proximally. He is unable to hold tone against gravity. Deep tendon reflexes are 1+ symmetric. Plantars downgoing.sensation coordination cannot be tested. Gait cannot be tested.  ASSESSMENT [name] with right hemispheric right brain cardioembolic infarcts secondary to atrial fibrillation. Post hospitalization hemorrhage. Has had tracheostomy and doing well on trach collar for the past 24h. Off ventriculostomy  yesterday by Dr Blanche East. For PEG today.  New loose stools  Stroke risk factors:  atrial fibrillation, diabetes mellitus, hyperlipidemia and CAD  Hospital day # 14  TREATMENT/PLAN Peg today at 3p. Check stool for cdiff.continue present treatment plan plan to wean off ventilator. Hopefully transferred to step down in the next one to 2 days. Family not available at the bedside today  Annie Main, AVNP, ANP-BC, GNP-BC [facility] Pager: [phone] 12/03/2010 8:10 AM  Dr. Delia Heady, Stroke Center Medical Director, has personally reviewed chart, pertinent data, examined the patient and developed the plan of care.

## 2010-12-03 NOTE — Progress Notes (Signed)
ANTIBIOTIC CONSULT NOTE - FOLLOW UP  Pharmacy Consult for Vancomycin and ceftaz Indication: HCAP  No Known Allergies  Patient Measurements: Height: 5\' 9"  (175.3 cm) Weight: 217 lb 6 oz (98.6 kg) IBW/kg (Calculated) : 70.7    Vital Signs: Temp: 98.3 F (36.8 C) (11/21 0800) Temp src: Oral (11/21 0800) BP: 180/128 mmHg (11/21 0900) Pulse Rate: 73  (11/21 0900) Intake/Output from previous day: 11/20 0701 - 11/21 0700 In: 2385 [I.V.:1150; NG/GT:885; IV Piggyback:350] Out: 1950 [Urine:1950] Intake/Output from this shift: Total I/O In: 150 [I.V.:150] Out: 575 [Urine:575]  Labs:  [facility] 12/03/10 0500 12/02/10 0545 12/01/10 0530  WBC -- 10.6* 10.2  HGB -- 11.5* 11.3*  PLT -- 289 270  LABCREA -- -- --  CREATININE 0.96 0.95 1.23   Estimated Creatinine Clearance: 99.5 ml/min (by C-G formula based on Cr of 0.96). No results found for this basename: VANCOTROUGH:2,VANCOPEAK:2,VANCORANDOM:2,GENTTROUGH:2,GENTPEAK:2,GENTRANDOM:2,TOBRATROUGH:2,TOBRAPEAK:2,TOBRARND:2,AMIKACINPEAK:2,AMIKACINTROU:2,AMIKACIN:2, in the last 72 hours   Microbiology: Recent Results (from the past 720 hour(s))  MRSA PCR SCREENING     Status: Normal   Collection Time   11/19/10  9:04 PM      Component Value Range Status Comment   MRSA by PCR NEGATIVE  NEGATIVE  Final   CULTURE, RESPIRATORY     Status: Normal   Collection Time   11/20/10  9:01 AM      Component Value Range Status Comment   Specimen Description TRACHEAL ASPIRATE   Final    Special Requests NONE   Final    Gram Stain     Final    Value: RARE WBC PRESENT,BOTH PMN AND MONONUCLEAR     NO SQUAMOUS EPITHELIAL CELLS SEEN     FEW GRAM POSITIVE COCCI     IN PAIRS RARE GRAM POSITIVE RODS     RARE GRAM NEGATIVE COCCI   Culture     Final    Value: MODERATE STAPHYLOCOCCUS AUREUS     Note: RIFAMPIN AND GENTAMICIN SHOULD NOT BE USED AS SINGLE DRUGS FOR TREATMENT OF STAPH INFECTIONS.   Report Status 11/23/2010 FINAL   Final    Organism ID, Bacteria  STAPHYLOCOCCUS AUREUS   Final   CULTURE, BLOOD (ROUTINE X 2)     Status: Normal   Collection Time   11/20/10 10:00 AM      Component Value Range Status Comment   Specimen Description BLOOD ARM LEFT   Final    Special Requests BOTTLES DRAWN AEROBIC AND ANAEROBIC [facility]   Final    Setup Time [id number]   Final    Culture NO GROWTH 5 DAYS   Final    Report Status 11/26/2010 FINAL   Final   CULTURE, BLOOD (ROUTINE X 2)     Status: Normal   Collection Time   11/20/10 10:20 AM      Component Value Range Status Comment   Specimen Description BLOOD HAND LEFT   Final    Special Requests BOTTLES DRAWN AEROBIC ONLY 5.0 CC   Final    Setup Time [id number]   Final    Culture NO GROWTH 5 DAYS   Final    Report Status 11/26/2010 FINAL   Final   CULTURE, BLOOD (ROUTINE X 2)     Status: Normal   Collection Time   11/24/10 11:45 AM      Component Value Range Status Comment   Specimen Description BLOOD LEFT ARM   Final    Special Requests BOTTLES DRAWN AEROBIC AND ANAEROBIC 10CC   Final    Setup  Time [id number]   Final    Culture NO GROWTH 5 DAYS   Final    Report Status 11/30/2010 FINAL   Final   CULTURE, BLOOD (ROUTINE X 2)     Status: Normal   Collection Time   11/24/10 12:00 PM      Component Value Range Status Comment   Specimen Description BLOOD LEFT ARM   Final    Special Requests BOTTLES DRAWN AEROBIC AND ANAEROBIC 10CC   Final    Setup Time [id number]   Final    Culture NO GROWTH 5 DAYS   Final    Report Status 11/30/2010 FINAL   Final   URINE CULTURE     Status: Normal   Collection Time   11/24/10  1:41 PM      Component Value Range Status Comment   Specimen Description URINE, CLEAN CATCH   Final    Special Requests NONE   Final    Setup Time [id number]   Final    Colony Count NO GROWTH   Final    Culture NO GROWTH   Final    Report Status 11/25/2010 FINAL   Final   CULTURE, RESPIRATORY     Status: Normal   Collection Time   11/24/10  5:15 PM      Component Value  Range Status Comment   Specimen Description TRACHEAL ASPIRATE   Final    Special Requests NONE   Final    Gram Stain     Final    Value: ABUNDANT WBC PRESENT, PREDOMINANTLY PMN     FEW SQUAMOUS EPITHELIAL CELLS PRESENT     RARE GRAM POSITIVE COCCI IN PAIRS   Culture Non-Pathogenic Oropharyngeal-type Flora Isolated.   Final    Report Status 11/27/2010 FINAL   Final   CULTURE, BLOOD (ROUTINE X 2)     Status: Normal (Preliminary result)   Collection Time   12/01/10 10:35 AM      Component Value Range Status Comment   Specimen Description BLOOD ARM RIGHT   Final    Special Requests BOTTLES DRAWN AEROBIC AND ANAEROBIC 10CC EACH   Final    Setup Time [id number]   Final    Culture     Final    Value:        BLOOD CULTURE RECEIVED NO GROWTH TO DATE CULTURE WILL BE HELD FOR 5 DAYS BEFORE ISSUING A FINAL NEGATIVE REPORT   Report Status PENDING   Incomplete   CULTURE, BLOOD (ROUTINE X 2)     Status: Normal (Preliminary result)   Collection Time   12/01/10 10:45 AM      Component Value Range Status Comment   Specimen Description BLOOD ARM RIGHT   Final    Special Requests BOTTLES DRAWN AEROBIC AND ANAEROBIC 10CC EACH   Final    Setup Time [id number]   Final    Culture     Final    Value:        BLOOD CULTURE RECEIVED NO GROWTH TO DATE CULTURE WILL BE HELD FOR 5 DAYS BEFORE ISSUING A FINAL NEGATIVE REPORT   Report Status PENDING   Incomplete   CSF CULTURE     Status: Normal (Preliminary result)   Collection Time   12/01/10 10:59 AM      Component Value Range Status Comment   Specimen Description FLUID CSF   Final    Special Requests FLUID FROM THE SHUNT   Final    Gram Stain     Final  Value: NO WBC SEEN     NO ORGANISMS SEEN   Culture NO GROWTH 1 DAY   Final    Report Status PENDING   Incomplete   CULTURE, RESPIRATORY     Status: Normal   Collection Time   12/01/10 12:12 PM      Component Value Range Status Comment   Specimen Description TRACHEAL ASPIRATE   Final    Special  Requests NONE   Final    Gram Stain     Final    Value: FEW WBC PRESENT, PREDOMINANTLY PMN     RARE SQUAMOUS EPITHELIAL CELLS PRESENT     RARE GRAM NEGATIVE RODS   Culture Non-Pathogenic Oropharyngeal-type Flora Isolated.   Final    Report Status 12/03/2010 FINAL   Final     Anti-infectives     Start     Dose/Rate Route Frequency Ordered Stop   12/03/10 1400   cefTAZidime (FORTAZ) 1 g in dextrose 5 % 50 mL IVPB        1 g 100 mL/hr over 30 Minutes Intravenous  Once 12/02/10 1437     12/01/10 1100   vancomycin (VANCOCIN) 1,250 mg in sodium chloride 0.9 % 250 mL IVPB        1,250 mg 166.7 mL/hr over 90 Minutes Intravenous Every 12 hours 12/01/10 1036     12/01/10 1100   cefTAZidime (FORTAZ) 1 g in dextrose 5 % 50 mL IVPB  Status:  Discontinued        1 g 100 mL/hr over 30 Minutes Intravenous Every 8 hours 12/01/10 1036 12/02/10 1437   12/01/10 0930   cefTAZidime (FORTAZ) injection 1 g  Status:  Discontinued     Comments: Make this IV now and pharmacy to dose NOT IM      1 g Intramuscular 3 times per day 12/01/10 0932 12/01/10 0938   12/01/10 0930   vancomycin (VANCOCIN) IVPB 1000 mg/200 mL premix  Status:  Discontinued        1,000 mg 200 mL/hr over 60 Minutes Intravenous Every 12 hours 12/01/10 0932 12/01/10 0938   11/24/10 1400   vancomycin (VANCOCIN) 1,250 mg in sodium chloride 0.9 % 250 mL IVPB  Status:  Discontinued        1,250 mg 166.7 mL/hr over 90 Minutes Intravenous Every 12 hours 11/24/10 1327 11/28/10 1427   11/24/10 1400   piperacillin-tazobactam (ZOSYN) IVPB 3.375 g  Status:  Discontinued        3.375 g 12.5 mL/hr over 240 Minutes Intravenous 3 times per day 11/24/10 1327 12/01/10 0932   11/20/10 1000   cefTRIAXone (ROCEPHIN) 1 g in dextrose 5 % 50 mL IVPB  Status:  Discontinued        1 g 100 mL/hr over 30 Minutes Intravenous Every 24 hours 11/20/10 0847 11/24/10 1115   11/20/10 0915   azithromycin (ZITHROMAX) 500 mg in dextrose 5 % 250 mL IVPB  Status:   Discontinued        500 mg 250 mL/hr over 60 Minutes Intravenous Every 24 hours 11/20/10 0825 11/24/10 1115   11/20/10 0830   cefTRIAXone (ROCEPHIN) injection 1 g  Status:  Discontinued        1 g Intramuscular Every 24 hours 11/20/10 0825 11/20/10 0847          Assessment: 55 yo M on day 3 vancomycin and ceftaz for HCAP and fevers. Renal fxn stable. Scheduled ceftaz dosing discontinued yesterday and one time dose ordered for today. CSF culture  NGTD, trach asp NGTD, and C diff PCR pending.  Goal of Therapy:  Vancomycin trough level 15-20 mcg/ml  Plan:  1. Continue vancomycin 1250 mg q12h. 2. MD to clarify if ceftaz to be continued continuously. 3. Follow up culture results  Loura Back Danielle 12/03/2010,11:26 AM

## 2010-12-04 ENCOUNTER — Inpatient Hospital Stay (HOSPITAL_COMMUNITY): Payer: 59

## 2010-12-04 DIAGNOSIS — J9600 Acute respiratory failure, unspecified whether with hypoxia or hypercapnia: Secondary | ICD-10-CM

## 2010-12-04 DIAGNOSIS — I629 Nontraumatic intracranial hemorrhage, unspecified: Secondary | ICD-10-CM

## 2010-12-04 DIAGNOSIS — R4020 Unspecified coma: Secondary | ICD-10-CM

## 2010-12-04 DIAGNOSIS — I6340 Cerebral infarction due to embolism of unspecified cerebral artery: Secondary | ICD-10-CM

## 2010-12-04 LAB — BASIC METABOLIC PANEL
BUN: 28 mg/dL — ABNORMAL HIGH (ref 6–23)
Chloride: 115 mEq/L — ABNORMAL HIGH (ref 96–112)
GFR calc Af Amer: >90 mL/min (ref >90)
Glucose, Bld: 139 mg/dL — ABNORMAL HIGH (ref 70–99)
Potassium: 3.5 mEq/L (ref 3.5–5.1)

## 2010-12-04 LAB — GLUCOSE, CAPILLARY
Glucose-Capillary: 127 mg/dL — ABNORMAL HIGH (ref 70–99)
Glucose-Capillary: 130 mg/dL — ABNORMAL HIGH (ref 70–99)
Glucose-Capillary: 140 mg/dL — ABNORMAL HIGH (ref 70–99)

## 2010-12-04 LAB — CSF CULTURE W GRAM STAIN: Gram Stain: NONE SEEN

## 2010-12-04 NOTE — Progress Notes (Signed)
Stroke Team Progress Note  SUBJECTIVE [name] with right cerebellar infarct complicated by hemorrhage who is gradually improving. Brother at bedside. PEG placed yest without incident. Remains on Trach collar, off vent > 24h now.  OBJECTIVE Most recent Vital Signs: Temp: 98.7 F (37.1 C) (11/22 0755) Temp src: Oral (11/22 0755) BP: 183/113 mmHg (11/22 0706) Pulse Rate: 94  (11/22 0700) Respiratory Rate: 24 O2 Saturdation: 100%  CBG (last 3)   Basename 12/04/10 0752 12/04/10 0359 12/03/10 2341  GLUCAP 140* 127* 130*   Intake/Output from previous day: 11/21 0701 - 11/22 0700 In: 1540.8 [I.V.:1210.8; IV Piggyback:250] Out: 2615 [Urine:2335; Stool:280]  IV Fluid Intake:     . sodium chloride 50 mL/hr (12/04/10 0814)  . feeding supplement (JEVITY 1.2) 1,000 mL (12/04/10 0657)  . niCARDipine 5 mg/hr (11/28/10 0550)   Diet:   NPO.   Activity: Bedrest  DVT Prophylaxis:  SCDs   Studies: Results for orders placed during the hospital encounter of 11/19/10 (from the past 24 hour(s))  CLOSTRIDIUM DIFFICILE BY PCR     Status: Normal   Collection Time   12/03/10 11:11 AM      Component Value Range   C difficile by pcr NEGATIVE  NEGATIVE   BLOOD GAS, ARTERIAL     Status: Abnormal   Collection Time   12/03/10  8:00 PM      Component Value Range   O2 Content 28.0     Delivery systems TRACH COLLAR/TRACH TUBE     pH, Arterial 7.491 (*) 7.350 - 7.450    pCO2 arterial 32.2 (*) 35.0 - 45.0 (mmHg)   pO2, Arterial 115.0 (*) 80.0 - 100.0 (mmHg)   Bicarbonate 24.4 (*) 20.0 - 24.0 (mEq/L)   TCO2 25.4  0 - 100 (mmol/L)   Acid-Base Excess 1.3  0.0 - 2.0 (mmol/L)   O2 Saturation 98.5     Patient temperature 98.6     Collection site RIGHT RADIAL     Drawn by [id number]     Sample type ARTERIAL DRAW     Allens test (pass/fail) PASS  PASS   BASIC METABOLIC PANEL     Status: Abnormal   Collection Time   12/04/10  4:30 AM      Component Value Range   Sodium 151 (*)  135 - 145 (mEq/L)   Potassium 3.5  3.5 - 5.1 (mEq/L)   Chloride 115 (*) 96 - 112 (mEq/L)   CO2 26  19 - 32 (mEq/L)   Glucose, Bld 139 (*) 70 - 99 (mg/dL)   BUN 28 (*) 6 - 23 (mg/dL)   Creatinine, Ser 1.47  0.50 - 1.35 (mg/dL)   Calcium 82.9  8.4 - 10.5 (mg/dL)   GFR calc non Af Amer >90  >90 (mL/min)   GFR calc Af Amer >90  >90 (mL/min)     Dg Chest Port 1 View  12/03/2010  *RADIOLOGY REPORT*  Clinical Data: Evaluation of tracheostomy tube.  PORTABLE CHEST - 1 VIEW  Comparison: 12/02/2010.  Findings: Tip of tracheostomy tube terminates 5 cm above the carina.  Enteric tube enters area of stomach.  Tip is not included on the image.  There is stable moderate cardiac silhouette enlargement.  There is elevation of the right hemidiaphragm unchanged.  There is minimal basilar atelectasis.  No pulmonary edema, consolidation, or pleural effusion is evident.  IMPRESSION: Tip of tracheostomy tube terminates 5 cm above carina.  Enteric tube in place.  Stable cardiac  silhouette enlargement.  Minimal basilar atelectasis.  No new lesion.  Original Report Authenticated By: Crawford Givens, M.D.    Physical Exam:  awake follows commands well. Eye movements are full range with end gaze nystagmus on horizontal and vertical gaze. Face is symmetric without weakness. Tongue is midline. Motor system exam reveals.Purposeful moments in all 4 limbs distally but significant weakness proximally.Moves right upper and lower limbs more than left. He is unable to hold tone against gravity. Deep tendon reflexes are 1+ symmetric. Plantars downgoing.sensation coordination cannot be tested. Gait cannot be tested.  ASSESSMENT [name] with a right hemispheric right brain cardioembolic infarcts secondary to atrial fibrillation. Post hospitalization hemorrhage. Now has had tracheostomy and PEG placement. Doing well on trach collar for the past 24h. Step down orders pending.   Stroke risk factors:  atrial  fibrillation, diabetes mellitus, hyperlipidemia and CAD  Hospital day # 15  TREATMENT/PLAN Agree with step down transfer. Get OOB. Therapy evals.Discussed with brother and answered questions.SNF next week  SHARON BIBY, AVNP, ANP-BC, GNP-BC [facility] Pager: [phone] 12/04/2010 8:26 AM  Dr. Delia Heady, Stroke Center Medical Director, has personally reviewed chart, pertinent data, examined the patient and developed the plan of care.

## 2010-12-04 NOTE — Progress Notes (Signed)
[name] is a 55 y.o. male former smoker admitted on 11/19/2010 with acute onset of slurred speech and Lt sided weakness. Dx Cerebekllar ischemic cva then converted to bleed. PMHx DM, CAD, CHF, HTN, Gout, A fib.  Line/tubes: ETT 11/7>>11/9, 11/10 (cerebellar hge) >>11/16 Trach (ENT) 11/16>>> Ventric 11/10 (dr Hrisch emergent) >>>11/20 R [place] TLC 11/7>>>11/17 R PICC line 11/17>>> 11/21 peg  Cx: Blood 11/8>>NTD Sputum 11/8>>Moderate Staph aureus. Blood 11/12>>>NTD Urine 11/12>>>NTD Sputum 11/12: abundant GPC pairs/abundant WBC>>>NF 11/19 BC x 2 (fever104) Csf 11/19>>>gram stain neg>>>  Abx: Rocephin 11/8>>11/12 Zithromax 11/8>>11/12 Zosyn 11/12>>>11/19 Vancomycin 11/12>>>11/16 vanc ( fever 104, ivc) 11/19>>> ceftaz 11/19 (CSF ) 11/19>>>  Best practice: Pepcid Heparin gtt/coumadin for A fib >>  stopped 11/19   Consults: Neuro Cardiology [name]) [name]>>>11/20  Tests/events: 11/7: CT head>>negative 11/7: CT chest>>b/l GGO more at bases, small b/l effusions, no PE, dependent ATX 11/8: MRI head w/o contrast>>Acute/subacute non-hemorrhagic infarct Rt superior cerebellar artery territory, remote Lt cerebellar infarct, multiple punctated areas of remote hemorrhage compatible with amyloid angiopathy 11/8: Echo>>severe LVH, EF 25 to 35%, mild AR, mild MR, PAS 11/10 - CT head am: marked hgic transformation of rt cerebaellar infarct -> supratentorial hydrocephalus -> ventric drain 11/10 - GCS 5 then reintubated. 11/18 CT head>>>Right cerebellar infarct with improving hemorrhage. Associated<BR>cerebellar edema with effacement of the 4th ventricle and upward<BR>transtentorial herniation, unchanged.<BR> <BR>Right frontal approach ventriculostomy catheter with<BR>ventriculomegaly, stable versus mildly improved.<BR> <BR>Layering hemorrhage in the posterior horns of the lateral<BR>ventricles, mildly increased.<BR> 11/19 CT head>>>Right cerebellar intraparenchymal hemorrhage  measures slightly<BR>larger at 2 cm. Increased surrounding edema. Persistent upward<BR>transtentorial herniation, effacement of the fourth ventricle, and<BR>mild downward tonsillar herniation.<BR> <BR>Right transfrontal approach intraventricular catheter. There is<BR>persistent prominence of the lateral ventricles and third ventricle<BR>with layering hemorrhage within the occipital horns of the lateral<BR>ventricles.<BR>> 11/19 - high fevers 11/20- improved fever and neurostatus  SUBJECTIVE: fevers contoled, trach collar, more awake  OBJECTIVE:  Temp:  [97.8 F (36.6 C)-98.3 F (36.8 C)] 98.1 F (36.7 C) (11/22 0400) Pulse Rate:  [40-110] 94  (11/22 0700) Resp:  [14-30] 24  (11/22 0700) BP: (93-183)/(72-138) 183/113 mmHg (11/22 0706) SpO2:  [98 %-100 %] 100 % (11/22 0700) FiO2 (%):  [28 %] 28 % (11/22 0700) Weight:  [213 lb 6.5 oz (96.8 kg)-217 lb 6 oz (98.6 kg)] 213 lb 6.5 oz (96.8 kg) (11/22 0500)   Intake/Output Summary (Last 24 hours) at 12/04/10 0721 Last data filed at 12/04/10 0700  Gross per 24 hour  Intake 1540.83 ml  Output   2615 ml  Net -1074.17 ml   General - obese more awake ENT: - Pupils reactive err Cardiac RRR, Nl S1/S2, -M Chest - Coarse BS bilaterally improved Abd - soft, non-tender, ND and +BS, peg in place Ext - no edema  Neuro - awake, follows commands  Lab Results  Component Value Date   CREATININE 0.92 12/04/2010   BUN 28* 12/04/2010   NA 151* 12/04/2010   K 3.5 12/04/2010   CL 115* 12/04/2010   CO2 26 12/04/2010   CBC    Component Value Date/Time   WBC 10.6* 12/02/2010 0545   RBC 3.89* 12/02/2010 0545   HGB 11.5* 12/02/2010 0545   HCT 35.8* 12/02/2010 0545   PLT 289 12/02/2010 0545   MCV 92.0 12/02/2010 0545   MCH 29.6 12/02/2010 0545   MCHC 32.1 12/02/2010 0545   RDW 15.1 12/02/2010 0545   LYMPHSABS 2.6 12/02/2010 0545   MONOABS 1.0 12/02/2010 0545   EOSABS 0.3 12/02/2010 0545  BASOSABS 0.1 12/02/2010 0545   Lab Results    Component Value Date   TSH 0.895 11/20/2010   FreeT4>>1.82 11/20/2010   ASSESSMENT/PLAN:  1) Cerebellar Ischemic infract with ? Hemorrhage transformation/Coma, opens eyes follows some commands. T-collar x 24 hours Off hypertonic saline per neuro. Prognosis remains very poor. .  High fevers all resolved and associated with edema Plan:   - csf sent, drain out, remains neg Improved  3% per neurology, now off Chem in am for Na 151  Peg in Fever controlled  2) Acute resp failure: Due to inability to protect airway 11/19 Improved 11/20, now follows commands PLAN: Back to trach collar goal 24 hrs pcxr no infiltrates   3) A. Fib: Heparin stopped following cerebellar hemorrhage 11/21/10 now on lovenox. PLAN - Monitor.tele  Treat fevers, done  4) Hypertension: PLAN: - PRN fentanyl for pain - Increased Norvasc to 10 mg po daily, continue coreg Shortage labetolol Added prn hydralazine Escalate clonodine 0.3 Improvd with this increase, some variability  5) h/o CAD Plan: - Monitor  6) H/O Gout Plan: If flare, then steroids iv is DOC in house  7) [id number] Plan: resolved No infiltrate, UA neg, no source  From prior, dc vanc, ceftaz Assess cdiff with increase stool, low threshold add flagyl empiric  8) Thyroiditis Plan: - CT images d/w radiology with concerns for thyroid inflammation - F/U mild elevation in FT4, and low normal TSH>>will monitor for now Ensure T3 done -- low ?? Sick euthyroid  9) Acute renal failure, improved slight Plan: Dc ACEI Chem in am   10) Hypernatremia. New brain edema Plan 3 % per Neurology, now off  11) Hyperglycemia Plan: - SSI, increase lantus again today 20 bid  Move to sdu  MINOR,[name] 12/04/2010, 7:21 AM  Patient seen and examined, agree with above note.  I dictated the care and orders written for this patient under my direction.  Koren Bound, M.D.

## 2010-12-04 NOTE — Progress Notes (Signed)
Post PEG placement. PEG site appears clean, Abdomen is soft with decreased  bowl sounds. TF started this am. Will sign off

## 2010-12-05 ENCOUNTER — Inpatient Hospital Stay (HOSPITAL_COMMUNITY): Payer: 59

## 2010-12-05 LAB — GLUCOSE, CAPILLARY
Glucose-Capillary: 167 mg/dL — ABNORMAL HIGH (ref 70–99)
Glucose-Capillary: 182 mg/dL — ABNORMAL HIGH (ref 70–99)
Glucose-Capillary: 197 mg/dL — ABNORMAL HIGH (ref 70–99)

## 2010-12-05 LAB — BLOOD GAS, ARTERIAL
Bicarbonate: 25.7 mEq/L — ABNORMAL HIGH (ref 20.0–24.0)
O2 Saturation: 98.9 %
Patient temperature: 98.6
TCO2: 26.8 mmol/L (ref 0–100)
pH, Arterial: 7.457 — ABNORMAL HIGH (ref 7.350–7.450)

## 2010-12-05 LAB — CBC
HCT: 26.9 % — ABNORMAL LOW (ref 39.0–52.0)
MCH: 29.2 pg (ref 26.0–34.0)
MCHC: 32 g/dL (ref 30.0–36.0)
MCV: 91.2 fL (ref 78.0–100.0)
Platelets: 299 10*3/uL (ref 150–400)
RDW: 14.7 % (ref 11.5–15.5)

## 2010-12-05 LAB — BASIC METABOLIC PANEL
BUN: 27 mg/dL — ABNORMAL HIGH (ref 6–23)
CO2: 23 mEq/L (ref 19–32)
Calcium: 7.7 mg/dL — ABNORMAL LOW (ref 8.4–10.5)
Chloride: 119 mEq/L — ABNORMAL HIGH (ref 96–112)
Creatinine, Ser: 0.87 mg/dL (ref 0.50–1.35)
Glucose, Bld: 170 mg/dL — ABNORMAL HIGH (ref 70–99)

## 2010-12-05 MED ORDER — FUROSEMIDE 10 MG/ML IJ SOLN
20.0000 mg | Freq: Two times a day (BID) | INTRAMUSCULAR | Status: AC
  Administered 2010-12-05 – 2010-12-06 (×2): 20 mg via INTRAVENOUS
  Filled 2010-12-05 (×2): qty 2

## 2010-12-05 MED ORDER — JEVITY 1.2 CAL PO LIQD
1000.0000 mL | ORAL | Status: DC
  Administered 2010-12-05 – 2010-12-22 (×18): 1000 mL
  Filled 2010-12-05 (×32): qty 1000

## 2010-12-05 MED ORDER — POTASSIUM CHLORIDE 20 MEQ/15ML (10%) PO LIQD
40.0000 meq | Freq: Once | ORAL | Status: AC
  Administered 2010-12-05: 40 meq
  Filled 2010-12-05 (×2): qty 30

## 2010-12-05 MED ORDER — POTASSIUM CHLORIDE 20 MEQ/15ML (10%) PO LIQD
40.0000 meq | Freq: Two times a day (BID) | ORAL | Status: AC
  Administered 2010-12-05 (×2): 40 meq
  Filled 2010-12-05 (×2): qty 30

## 2010-12-05 NOTE — Progress Notes (Signed)
UR Completed.  Aspasia Rude [name] [phone] 12/05/2010  

## 2010-12-05 NOTE — Progress Notes (Signed)
Physical Therapy Evaluation Patient Details Name: [name] MRN: [id number] DOB: March 08, 1955 Today's Date: 12/05/2010  Problem List:  Patient Active Problem List  Diagnoses  . Stroke  . Hypertensive emergency  . Pulmonary edema  . Acute exacerbation of congestive heart failure  . Hypoxemia  . Diabetes mellitus  . Respiratory failure  . A-fib  . Gout  . Encephalopathy  . CAD (coronary artery disease)  . Pneumonia, organism unspecified  . Thyroiditis  . Hyperlipemia  . Hypokalemia  . Volume overload    Past Medical History:  Past Medical History  Diagnosis Date  . Diabetes mellitus   . Coronary artery disease   . Hypertension   . Gout   . CHF (congestive heart failure)   . Afib   . Arthritis    Past Surgical History:  Past Surgical History  Procedure Date  . Tracheostomy tube placement 11/28/2010    Procedure: TRACHEOSTOMY;  Surgeon: Susy Frizzle, MD;  Location: [facility] OR;  Service: ENT;  Laterality: N/A;    PT Assessment/Plan/Recommendation PT Assessment Clinical Impression Statement: Patient is a 55 yo male admitted with left-sided weakness and dizziness.  Pt with right brain CVA.  Developed pulmonary edema and acute resp failure - intubated.  Pt then had large right cerebellar infarct with brainstem compression (hydrocephalus and cerebral edema).  Patient was not responsive with poor prognosis.  Over past few days, patient became more alert and began following some commands.  Now has trach and PEG.  Was able to follow commands for today's assessment.  Overall very weak, however has movement in all extremities.  Anticipate long rehabilitation course.  Recommend SNF at discharge for this continued therapy. PT Recommendation/Assessment: Patient will need skilled PT in the acute care venue PT Problem List: Decreased strength;Decreased activity tolerance;Decreased balance;Decreased mobility;Decreased coordination;Decreased cognition;Decreased knowledge of use of  DME;Cardiopulmonary status limiting activity PT Therapy Diagnosis : Difficulty walking;Generalized weakness;Altered mental status PT Plan PT Frequency: Min 3X/week PT Treatment/Interventions: DME instruction;Functional mobility training;Therapeutic exercise;Balance training;Neuromuscular re-education;Cognitive remediation;Patient/family education;Wheelchair mobility training PT Recommendation Follow Up Recommendations: Skilled nursing facility Equipment Recommended: Defer to next venue PT Goals  Acute Rehab PT Goals PT Goal Formulation: With patient/family Time For Goal Achievement: 2 weeks Pt will Roll Supine to Right Side: with min assist;with rail PT Goal: Rolling Supine to Right Side - Progress: Not met Pt will Roll Supine to Left Side: with min assist;with rail PT Goal: Rolling Supine to Left Side - Progress: Not met Pt will go Supine/Side to Sit: with mod assist;with HOB 0 degrees;with rail PT Goal: Supine/Side to Sit - Progress: Not met Pt will Sit at [facility] of Bed: with mod assist;6-10 min;with bilateral upper extremity support PT Goal: Sit at Edge Of Bed - Progress: Not met Pt will go Sit to Supine/Side: with mod assist;with HOB 0 degrees;with rail PT Goal: Sit to Supine/Side - Progress: Not met Pt will Transfer Sit to Stand/Stand to Sit: with mod assist;with upper extremity assist PT Transfer Goal: Sit to Stand/Stand to Sit - Progress: Not met Pt will Transfer Bed to Chair/Chair to Bed: with mod assist PT Transfer Goal: Bed to Chair/Chair to Bed - Progress: Not met  PT Evaluation Precautions/Restrictions  Precautions Precautions: Fall Required Braces or Orthoses: No Restrictions Weight Bearing Restrictions: No Prior Functioning  Home Living Lives With: Alone Type of Home: House Home Layout: One level Home Access: Stairs to enter Entrance Stairs-Rails: Lawyer of Steps: 3-4 Bathroom Toilet: Standard Home Adaptive Equipment: Crutches Prior  Function Level of Independence: Independent with basic ADLs;Independent with homemaking with ambulation;Independent with gait Driving: Yes Cognition Cognition Arousal/Alertness: Lethargic (Awake but lethargic.  Able to answer yes/no questions) Overall Cognitive Status: Difficult to assess Difficult to assess due to: impaired communication;tracheostomy Orientation Level: Oriented to person Sensation/Coordination Coordination Gross Motor Movements are Fluid and Coordinated: Not tested Extremity Assessment RUE Assessment RUE Assessment:  (Noted edema in hand - positioned on pillow) RUE PROM (degrees) Overall PROM Right Upper Extremity: Within functional limits for tasks performed RUE Strength RUE Overall Strength: Deficits;Other (Comment) (Shoulder/elbow 2/5; hand 3/5) RUE Tone RUE Tone: Hypotonic LUE Assessment LUE Assessment: Exceptions to [facility] (Note edema in hand - positioned on pillow) LUE PROM (degrees) Overall PROM Left Upper Extremity: Within functional limits for tasks assessed LUE Strength LUE Overall Strength: Deficits;Other (Comment) (Shoulder/elbow 2/5; Hand 3/5) LUE Tone LUE Tone: Hypotonic RLE Assessment RLE Assessment: Exceptions to [facility] RLE PROM (degrees) Overall PROM Right Lower Extremity: Within functional limits for tasks assessed RLE Strength RLE Overall Strength: Deficits;Other (Comment) (Hip flex/ext 2/5; Knee ext 2/5; DF/PF 3/5) LLE PROM (degrees) Overall PROM Left Lower Extremity: Within functional limits for tasks assessed LLE Strength LLE Overall Strength: Deficits;Other (Comment) (Hip flex/ext 2/5; Knee ext 2/5; DF/PF 3/5) Mobility (including Balance) Bed Mobility Bed Mobility: Yes Rolling Right: 1: +2 Total assist Rolling Right Details (indicate cue type and reason): Patient requires total assist for all mobility Rolling Left: 1: +2 Total assist Rolling Left Details (indicate cue type and reason): Patient requires total assist for all  mobility Transfers Transfers: No Ambulation/Gait Ambulation/Gait: No    Exercise  General Exercises - Lower Extremity Ankle Circles/Pumps: AROM;Both;10 reps;Supine (Instructed patient (and brother) to do this 3x/day) Hand Exercises Digit Composite Flexion: AROM;Both;10 reps Composite Extension: AROM;Both;10 reps (Instructed patient (and brother) to do these 3x/day) End of Session PT - End of Session Activity Tolerance: Patient limited by fatigue;Treatment limited secondary to medical complications (Comment) (CVA and prolonged bedrest impacting act. tolerance) Patient left: in bed;with family/visitor present General Behavior During Session: Lethargic Cognition:  (Difficult to assess.  Able to follow commands)  Vena Austria  [phone] 12/05/2010, 7:19 PM

## 2010-12-05 NOTE — Progress Notes (Signed)
Stroke Team Progress Note  SUBJECTIVE [name] with cerebellar infarct status post tracheostomy, ventriculostomy and PEG tube placement who is doing well and has now been on trach collar for more than 24 hours. His secretions are minimal. He is arousable and follows commands and wriggles toes and fingers. He had a restless night and did not sleep well and hence appears to be drowsy this morning he at he has been afebrile and we have not noted any new medical issues overnight. OBJECTIVE Most recent Vital Signs: Temp: 99.9 F (37.7 C) (11/23 0742) Temp src: Oral (11/23 0742) BP: 156/98 mmHg (11/23 0900) Pulse Rate: 101  (11/23 0900) Respiratory Rate: 24 O2 Saturdation: 99%  CBG (last 3)   Basename 12/05/10 0740 12/05/10 0326 12/04/10 2337  GLUCAP 167* 178* 150*   Intake/Output from previous day: 11/22 0701 - 11/23 0700 In: 2620 [I.V.:1150; NG/GT:60] Out: 2000 [Urine:2000]  IV Fluid Intake:     . sodium chloride 50 mL/hr at 12/05/10 0800  . feeding supplement (JEVITY 1.2) 1,000 mL (12/05/10 0506)  . niCARDipine 5 mg/hr (11/28/10 0550)   Diet:   tube feeds  Activity: Up with assistance   DVT Prophylaxis:  lovenox Studies: Results for orders placed during the hospital encounter of 11/19/10 (from the past 24 hour(s))  GLUCOSE, CAPILLARY     Status: Abnormal   Collection Time   12/04/10 12:02 PM      Component Value Range   Glucose-Capillary 178 (*) 70 - 99 (mg/dL)   Comment 1 Notify RN     Comment 2 Documented in Chart    GLUCOSE, CAPILLARY     Status: Abnormal   Collection Time   12/04/10  3:42 PM      Component Value Range   Glucose-Capillary 161 (*) 70 - 99 (mg/dL)  GLUCOSE, CAPILLARY     Status: Abnormal   Collection Time   12/04/10  7:56 PM      Component Value Range   Glucose-Capillary 186 (*) 70 - 99 (mg/dL)  GLUCOSE, CAPILLARY     Status: Abnormal   Collection Time   12/04/10 11:37 PM      Component Value Range   Glucose-Capillary  150 (*) 70 - 99 (mg/dL)  GLUCOSE, CAPILLARY     Status: Abnormal   Collection Time   12/05/10  3:26 AM      Component Value Range   Glucose-Capillary 178 (*) 70 - 99 (mg/dL)  BASIC METABOLIC PANEL     Status: Abnormal   Collection Time   12/05/10  4:15 AM      Component Value Range   Sodium 147 (*) 135 - 145 (mEq/L)   Potassium 2.8 (*) 3.5 - 5.1 (mEq/L)   Chloride 119 (*) 96 - 112 (mEq/L)   CO2 23  19 - 32 (mEq/L)   Glucose, Bld 170 (*) 70 - 99 (mg/dL)   BUN 27 (*) 6 - 23 (mg/dL)   Creatinine, Ser 4.54  0.50 - 1.35 (mg/dL)   Calcium 7.7 (*) 8.4 - 10.5 (mg/dL)   GFR calc non Af Amer >90  >90 (mL/min)   GFR calc Af Amer >90  >90 (mL/min)  CBC     Status: Abnormal   Collection Time   12/05/10  4:15 AM      Component Value Range   WBC 7.5  4.0 - 10.5 (K/uL)   RBC 2.95 (*) 4.22 - 5.81 (MIL/uL)   Hemoglobin 8.6 (*) 13.0 - 17.0 (g/dL)  HCT 26.9 (*) 39.0 - 52.0 (%)   MCV 91.2  78.0 - 100.0 (fL)   MCH 29.2  26.0 - 34.0 (pg)   MCHC 32.0  30.0 - 36.0 (g/dL)   RDW 14.7  82.9 - 56.2 (%)   Platelets 299  150 - 400 (K/uL)  GLUCOSE, CAPILLARY     Status: Abnormal   Collection Time   12/05/10  7:40 AM      Component Value Range   Glucose-Capillary 167 (*) 70 - 99 (mg/dL)   Comment 1 Notify RN     Comment 2 Documented in Chart       Dg Chest Port 1 View  12/05/2010  *RADIOLOGY REPORT*  Clinical Data: Tracheostomy tube  PORTABLE CHEST - 1 VIEW  Comparison: Yesterday  Findings: Tracheostomy tube and left PICC are stable.  Moderate cardiomegaly.  Vascular congestion without edema.  Low volumes and basilar atelectasis.  IMPRESSION: Stable bibasilar atelectasis and vascular congestion.  Original Report Authenticated By: Donavan Burnet, M.D.   Dg Chest Port 1 View  12/04/2010  *RADIOLOGY REPORT*  Clinical Data: Fever.  On ventilator.  PORTABLE CHEST - 1 VIEW  Comparison: 12/03/2010  Findings: Tracheostomy tube remains in appropriate position as well as left arm PICC line.  Feeding tube has  been removed.  Cardiomegaly stable.  No evidence of acute infiltrate or edema. Mild atelectasis or scarring at the left lung base is stable.  IMPRESSION: Stable cardiomegaly and mild left basilar atelectasis versus scarring.  No acute findings.  Original Report Authenticated By: Danae Orleans, M.D.    Physical Exam:   awake follows commands well. Eye movements are full range with end gaze nystagmus on horizontal and vertical gaze. Face is symmetric without weakness. Tongue is midline. Motor system exam reveals.Purposeful moments in all 4 limbs distally but significant weakness proximally.Moves right upper and lower limbs more than left. He is unable to hold tone against gravity. Deep tendon reflexes are 1+ symmetric. Plantars downgoing.sensation coordination cannot be tested. Gait cannot be tested.    ASSESSMENT [name]  with a right hemispheric right brain cardioembolic infarcts secondary to atrial fibrillation. Post hospitalization hemorrhage. Now has had tracheostomy and PEG placement. Doing well on trach collar for the past 24h. Step down orders pending.  Stroke risk factors:atrial fibrillation, diabetes mellitus, hyperlipidemia and CAD       Hospital day # 16  TREATMENT/PLAN   Agree with step down transfer. Get OOB. Therapy evals.Discussed with brother and answered questions.SNF next week Family not available at bedside today     Hiram Gash [facility] Pager: [phone] 12/05/2010 9:19 AM

## 2010-12-05 NOTE — Progress Notes (Signed)
Patient had declining neuro status change becoming more lethargic needing constant stimulation in order to stay awake.  Patient withdraws to painful stimuli and when awake he is able to nod yes to his name and is able to follow simple commands sucha s closing eyes and looking left and right.  Dr. Hazle Quant CCM notified and order for STAT ABG was given.  RT notfied and will await results of ABG.  Lucretia Field

## 2010-12-05 NOTE — Progress Notes (Signed)
[name] is a 55 y.o. male former smoker admitted on 11/19/2010 with acute onset of slurred speech and Lt sided weakness. Dx Cerebekllar ischemic cva then converted to bleed. PMHx DM, CAD, CHF, HTN, Gout, A fib.  Line/tubes: ETT 11/7>>11/9, 11/10 (cerebellar hge) >>11/16 Trach (ENT) 11/16>>> Ventric 11/10 (dr Hrisch emergent) >>>11/20 R [place] TLC 11/7>>>11/17 R PICC line 11/17>>> 11/21 peg [name]  Cx: Blood 11/8>>NTD Sputum 11/8>>Moderate Staph aureus. Blood 11/12>>>NTD Urine 11/12>>>NTD Sputum 11/12: abundant GPC pairs/abundant WBC>>>NF 11/19 BC x 2 (fever104) Csf 11/19>>>gram stain neg>>>  Abx: Rocephin 11/8>>11/12 Zithromax 11/8>>11/12 Zosyn 11/12>>>11/19 Vancomycin 11/12>>>11/16 vanc ( fever 104, ivc) 11/19>>>11/21 ceftaz 11/19 (CSF ) 11/19>>>11/21  Best practice: Pepcid Heparin gtt/coumadin for A fib >>  stopped 11/19   Consults: Neuro Cardiology [name]) [name]>>>11/20  Tests/events: 11/7: CT head>>negative 11/7: CT chest>>b/l GGO more at bases, small b/l effusions, no PE, dependent ATX 11/8: MRI head w/o contrast>>Acute/subacute non-hemorrhagic infarct Rt superior cerebellar artery territory, remote Lt cerebellar infarct, multiple punctated areas of remote hemorrhage compatible with amyloid angiopathy 11/8: Echo>>severe LVH, EF 25 to 35%, mild AR, mild MR, PAS 11/10 - CT head am: marked hgic transformation of rt cerebaellar infarct -> supratentorial hydrocephalus -> ventric drain 11/10 - GCS 5 then reintubated. 11/18 CT head>>>Right cerebellar infarct with improving hemorrhage. Associated<BR>cerebellar edema with effacement of the 4th ventricle and upward<BR>transtentorial herniation, unchanged.<BR> <BR>Right frontal approach ventriculostomy catheter with<BR>ventriculomegaly, stable versus mildly improved.<BR> <BR>Layering hemorrhage in the posterior horns of the lateral<BR>ventricles, mildly increased.<BR> 11/19 CT head>>>Right cerebellar  intraparenchymal hemorrhage measures slightly<BR>larger at 2 cm. Increased surrounding edema. Persistent upward<BR>transtentorial herniation, effacement of the fourth ventricle, and<BR>mild downward tonsillar herniation.<BR> <BR>Right transfrontal approach intraventricular catheter. There is<BR>persistent prominence of the lateral ventricles and third ventricle<BR>with layering hemorrhage within the occipital horns of the lateral<BR>ventricles.<BR>> 11/19 - high fevers 11/20- improved fever and neurostatus 11/21 PEG 11/21 - no vent 24 hr  SUBJECTIVE: 24 hrs off vent successful  OBJECTIVE:  Temp:  [98.4 F (36.9 C)-99.9 F (37.7 C)] 99.9 F (37.7 C) (11/23 0742) Pulse Rate:  [51-101] 90  (11/23 1100) Resp:  [12-26] 26  (11/23 1100) BP: (90-180)/(52-113) 106/85 mmHg (11/23 1100) SpO2:  [98 %-100 %] 100 % (11/23 1100) FiO2 (%):  [28 %] 28 % (11/23 0900) Weight:  [99.8 kg (220 lb 0.3 oz)] 220 lb 0.3 oz (99.8 kg) (11/23 0400)   Intake/Output Summary (Last 24 hours) at 12/05/10 1118 Last data filed at 12/05/10 1000  Gross per 24 hour  Intake   2530 ml  Output   1530 ml  Net   1000 ml   General - obese more awake ENT: - Pupils reactive err Cardiac RRR, Nl S1/S2, -M Chest - Coarse improved Abd - soft, non-tender, ND and +BS, peg in place clean Ext - no edema  Neuro - awake, follows commands  Lab Results  Component Value Date   CREATININE 0.87 12/05/2010   BUN 27* 12/05/2010   NA 147* 12/05/2010   K 2.8* 12/05/2010   CL 119* 12/05/2010   CO2 23 12/05/2010   CBC    Component Value Date/Time   WBC 7.5 12/05/2010 0415   RBC 2.95* 12/05/2010 0415   HGB 8.6* 12/05/2010 0415   HCT 26.9* 12/05/2010 0415   PLT 299 12/05/2010 0415   MCV 91.2 12/05/2010 0415   MCH 29.2 12/05/2010 0415   MCHC 32.0 12/05/2010 0415   RDW 14.7 12/05/2010 0415   LYMPHSABS 2.6 12/02/2010 0545   MONOABS 1.0 12/02/2010 0545  EOSABS 0.3 12/02/2010 0545   BASOSABS 0.1 12/02/2010 0545   Lab Results    Component Value Date   TSH 0.895 11/20/2010   FreeT4>>1.82 11/20/2010   ASSESSMENT/PLAN:  1) Cerebellar Ischemic infract with ? Hemorrhage transformation/Coma, opens eyes follows some commands. T-collar x 24 hours Off hypertonic saline per neuro. Prognosis remains very poor. .  High fevers all resolved and associated with edema Plan:   Improved daily with alertness 3% per neurology, now off x 2 days Na follow up, in a good range now Still would not rush to correct na Lasix x 2, avoid pos balance noted last 2 days with prior brain edema as well   2) Acute resp failure: Due to inability to protect airway 11/19 Improved 11/20, now follows commands PLAN: Back to trach collar goal 24 hrs pcxr no infiltrates, mild ATX only Appears well Maintain trach collar   3) A. Fib: Heparin stopped following cerebellar hemorrhage 11/21/10 now on lovenox. PLAN - Monitor.tele   4) Hypertension: PLAN: - PRN fentanyl for pain - Increased Norvasc to 10 mg po daily, continue coreg Shortage labetolol Added prn hydralazine Escalate clonodine 0.3 Improvd with this increase clonodine, could increase further if needed No ACEI still, has poor affect overall BP controll anyway and easliy into ARF  5) h/o CAD Plan: - Monitor  6) H/O Gout Plan: If flare, then steroids iv is DOC in house  7) [id number] Plan: resolved All ABX, observe  8) Thyroiditis Plan: - Sick euthyroid likely Will need follow up levels  9) Acute renal failure, resolved Plan: Dc ACEI Chem in am  Lasix to even to slight neg   10) Hypernatremia. New brain edema Plan 3 % per Neurology, now off  11) Hyperglycemia Plan: - SSI, maintainlantus  Move to sdu  Barry Matura J. 12/05/2010, 11:18 AM  Patient seen and examined, agree with above note.  I dictated the care and orders written for this patient under my direction.  Nelda Bucks., M.D.

## 2010-12-06 ENCOUNTER — Inpatient Hospital Stay (HOSPITAL_COMMUNITY): Payer: 59

## 2010-12-06 LAB — GLUCOSE, CAPILLARY
Glucose-Capillary: 209 mg/dL — ABNORMAL HIGH (ref 70–99)
Glucose-Capillary: 131 mg/dL — ABNORMAL HIGH (ref 70–99)
Glucose-Capillary: 201 mg/dL — ABNORMAL HIGH (ref 70–99)
Glucose-Capillary: 213 mg/dL — ABNORMAL HIGH (ref 70–99)

## 2010-12-06 LAB — BASIC METABOLIC PANEL
CO2: 26 mEq/L (ref 19–32)
Chloride: 116 mEq/L — ABNORMAL HIGH (ref 96–112)
GFR calc Af Amer: 75 mL/min — ABNORMAL LOW (ref >90)
Potassium: 3.8 mEq/L (ref 3.5–5.1)

## 2010-12-06 LAB — PHOSPHORUS: Phosphorus: 3 mg/dL (ref 2.3–4.6)

## 2010-12-06 LAB — MAGNESIUM: Magnesium: 2.3 mg/dL (ref 1.5–2.5)

## 2010-12-06 MED ORDER — ISOSORB DINITRATE-HYDRALAZINE 20-37.5 MG PO TABS
1.0000 | ORAL_TABLET | Freq: Three times a day (TID) | ORAL | Status: DC
  Administered 2010-12-06 – 2010-12-20 (×42): 1 via ORAL
  Filled 2010-12-06 (×48): qty 1

## 2010-12-06 MED ORDER — CLONIDINE HCL 0.3 MG PO TABS
0.3000 mg | ORAL_TABLET | Freq: Three times a day (TID) | ORAL | Status: DC
Start: 2010-12-06 — End: 2010-12-07
  Administered 2010-12-06 – 2010-12-07 (×2): 0.3 mg via ORAL
  Filled 2010-12-06 (×5): qty 1

## 2010-12-06 MED ORDER — DEXTROSE 5 % IV SOLN
INTRAVENOUS | Status: DC
  Administered 2010-12-06 – 2010-12-07 (×2): via INTRAVENOUS

## 2010-12-06 NOTE — Progress Notes (Signed)
eLink Physician-Brief Progress Note Patient Name: [name] DOB: March 03, 1955 MRN: [id number]  Date of Service  12/06/2010   HPI/Events of Note   Head CT shows - bleeding worse  eICU Interventions  Dc lovenox Ct SCDs   Intervention Category Intermediate Interventions: Diagnostic test evaluation  [name] V. 12/06/2010, 5:01 PM

## 2010-12-06 NOTE — Progress Notes (Signed)
[name] is a 55 y.o. male former smoker admitted on 11/19/2010 with acute onset of slurred speech and Lt sided weakness. Dx Cerebellar ischemic cva then converted to bleed.  PMHx DM, CAD, CHF, HTN, Gout, A fib.  Line/tubes: ETT 11/7>>11/9, 11/10 (cerebellar hge) >>11/16 Trach (ENT) 11/16>>> Ventric 11/10 (dr [name] emergent) >>>11/20 R [place] TLC 11/7>>>11/17 R PICC line 11/17>>> 11/21 peg [name]  Cx: Blood 11/8>>Neg Sputum 11/8>>Moderate Staph aureus. Blood 11/12>>> neg x2 Urine 11/12>>>NTD Sputum 11/12: abundant GPC pairs/abundant WBC>>>NF 11/19 BC x 2 (fever104) >>> Csf 11/19>>>gram stain neg>>>neg Sputum 11/19 > neg C diff 11/21 > neg Sputum 11/24 >>> BC x2 11/24 >>> UC 11/24 >>>   Abx: Rocephin 11/8>>11/12 Zithromax 11/8>>11/12 Zosyn 11/12>>>11/19 Vancomycin 11/12>>>11/16 vanc ( fever 104, ivc) 11/19>>>11/21 ceftaz 11/19 (CSF ) 11/19>>>11/21  Best practice: Pepcid DVT  PAS and LMWH  Consults: Neuro Cardiology [name]) [name] NS>>>11/20  Tests/events: 11/7: CT head>>negative 11/7: CT chest>>b/l GGO more at bases, small b/l effusions, no PE, dependent ATX 11/8: MRI head w/o contrast>>Acute/subacute non-hemorrhagic infarct Rt superior cerebellar artery territory, remote Lt cerebellar infarct, multiple punctated areas of remote hemorrhage compatible with amyloid angiopathy 11/8: Echo>>severe LVH, EF 25 to 35%, mild AR, mild MR, PAS 11/10 - CT head am: marked hgic transformation of rt cerebaellar infarct -> supratentorial hydrocephalus -> ventric drain 11/10 - GCS 5 then reintubated. 11/18 CT head>>>Right cerebellar infarct with improving hemorrhage. Associated<BR>cerebellar edema with effacement of the 4th ventricle and upward<BR>transtentorial herniation, unchanged.<BR> <BR>Right frontal approach ventriculostomy catheter with<BR>ventriculomegaly, stable versus mildly improved.<BR> <BR>Layering hemorrhage in the posterior horns of the  lateral<BR>ventricles, mildly increased.<BR> 11/19 CT head>>>Right cerebellar intraparenchymal hemorrhage measures slightly<BR>larger at 2 cm. Increased surrounding edema. Persistent upward<BR>transtentorial herniation, effacement of the fourth ventricle, and<BR>mild downward tonsillar herniation.<BR> <BR>Right transfrontal approach intraventricular catheter. There is<BR>persistent prominence of the lateral ventricles and third ventricle<BR>with layering hemorrhage within the occipital horns of the lateral<BR>ventricles.<BR>> 11/19 - high fevers 11/20- improved fever and neurostatus 11/21 PEG 11/21 - no vent 24 hr 11/23 tx to sdu 11/24 re-ct Head (ams) >>  SUBJECTIVE:  Less responsive / more diaphoretic with sustained fever off abx  OBJECTIVE:  Temp:  [98.2 F (36.8 C)-101.3 F (38.5 C)] 101.3 F (38.5 C) (11/24 0800) Pulse Rate:  [43-108] 95  (11/24 0800) Resp:  [12-37] 22  (11/24 0800) BP: (90-180)/(52-123) 180/95 mmHg (11/24 0857) SpO2:  [98 %-100 %] 100 % (11/24 0800) FiO2 (%):  [28 %] 28 % (11/24 0414) Weight:  [210 lb 5.1 oz (95.4 kg)] 210 lb 5.1 oz (95.4 kg) (11/24 0500)   Intake/Output Summary (Last 24 hours) at 12/06/10 1024 Last data filed at 12/06/10 0800  Gross per 24 hour  Intake 2579.17 ml  Output   2285 ml  Net 294.17 ml   General - obese  ENT: - Pupils reactive err Cardiac RRR, Nl S1/S2, -M Chest - Coarse improved Abd - soft, non-tender, ND and +BS, peg in place clean Ext - no edema  Neuro - lethargic   Lab 12/06/10 0644 12/05/10 0415 12/04/10 0430  NA 151* 147* 151*  K 3.8 2.8* 3.5  CL 116* 119* 115*  CO2 26 23 26   BUN 28* 27* 28*  CREATININE 1.22 0.87 0.92  GLUCOSE 138* 170* 139*  ]  Lab 12/05/10 0415 12/02/10 0545 12/01/10 0530  HGB 8.6* 11.5* 11.3*  HCT 26.9* 35.8* 35.8*  WBC 7.5 10.6* 10.2  PLT 299 289 270  }    ASSESSMENT/PLAN:  1) Cerebellar Ischemic infarct  with ? Hemorrhage transformation/Coma  -collar x 24 hours per day,  Off  hypertonic saline per neuro. Prognosis remains very poor. .  Improved daily with alertness Allowing na to self correct but trending back up 11/24 with worsening ams Rec Head ct no contrast    2) Acute resp failure: Due to inability to protect airway 11/19  PLAN:  trach collar goal 24 hrs pcxr no infiltrates, mild ATX only     3) A. Fib: Heparin stopped following cerebellar hemorrhage 11/21/10 now on lovenox. PLAN - Monitor.tele   4) Hypertension: PLAN: - PRN fentanyl for pain   clonodine 0.3 Improvd with  increased clonodine, could increase further if needed No ACEI still, has poor affect overall BP controll anyway and easliy into ARF  5) h/o CAD Plan: - Monitor  6) H/O Gout Plan: If flare, then steroids iv is DOC in house  7) [id number] 11/24 [place]   8) Thyroiditis Plan: - Sick euthyroid likely   9) Acute renal failure, resolved Plan: Dcd  ACEI Chem in am  Lasix to even to slight neg  Lab Results  Component Value Date   CREATININE 1.22 12/06/2010   CREATININE 0.87 12/05/2010   CREATININE 0.92 12/04/2010     10) Hypernatremia. New brain edema Plan 3 % per Neurology, now off. Na 151 11/24 Lab Results  Component Value Date   NA 151* 12/06/2010   NA 147* 12/05/2010   NA 151* 12/04/2010   trending back up with fever so will rx gently with free water  11) Hyperglycemia Plan: - SSI, maintainlantus  Very little to offer that will restore health here - discussed limited prognosis with brother at bedside and risk of over treating fever (resistant organisms)   Barry Hughs, MD Pulmonary and Critical Care Medicine [facility] Cell [phone]

## 2010-12-06 NOTE — Progress Notes (Signed)
SUBJECTIVE [name] is a 55 y.o.afro-american right handed  male , presenting  initially on 11-29-10  as code stroke, but was already on Coumadin.  He had a cerebellar bleed with mass effect -   and required FFP.  CT showed a "re-bleed' on 12-02-10 . The patient had a Tracheostoma and ventriculostoma placed, as well as PEG.  Transferred yesterday to step down 3300.   Mental status changes yesterday evaluated by dr Pearlean Brownie  - today's CT shows a possible new Ischaemic stroke , see report below.   OBJECTIVE Most recent Vital Signs: Temp: 99.7 F (37.6 C) (11/24 1400) Temp src: Axillary (11/24 1400) BP: 107/81 mmHg (11/24 1300) Pulse Rate: 99  (11/24 1300) Respiratory Rate: 20 O2 Saturdation: 98%  CBG (last 3)   Basename 12/06/10 1217 12/06/10 0815 12/06/10 0352  GLUCAP 201* 131* 209*   Intake/Output from previous day: 11/23 0701 - 11/24 0700 In: 2789.2 [I.V.:1199.2; NG/GT:210] Out: 2290 [Urine:2290]  IV Fluid Intake:     . dextrose 50 mL/hr at 12/06/10 1157  . feeding supplement (JEVITY 1.2) 1,000 mL (12/05/10 2311)  . niCARDipine 5 mg/hr (11/28/10 0550)  . DISCONTD: sodium chloride 50 mL/hr at 12/06/10 0800  . DISCONTD: feeding supplement (JEVITY 1.2) 1,000 mL (12/05/10 0506)   Diet: tube feeds -liquids  Activity: ICU bed rest   DVT Prophylaxis:  Lovenox  Studies: Results for orders placed during the hospital encounter of 11/19/10 (from the past 24 hour(s))  GLUCOSE, CAPILLARY     Status: Abnormal   Collection Time   12/05/10  3:51 PM      Component Value Range   Glucose-Capillary 182 (*) 70 - 99 (mg/dL)  GLUCOSE, CAPILLARY     Status: Abnormal   Collection Time   12/05/10  7:53 PM      Component Value Range   Glucose-Capillary 197 (*) 70 - 99 (mg/dL)   Comment 1 Documented in Chart     Comment 2 Notify RN    GLUCOSE, CAPILLARY     Status: Abnormal   Collection Time   12/05/10 11:51 PM      Component Value Range   Glucose-Capillary 174 (*) 70 - 99 (mg/dL)    Comment 1 Documented in Chart     Comment 2 Notify RN    GLUCOSE, CAPILLARY     Status: Abnormal   Collection Time   12/06/10  3:52 AM      Component Value Range   Glucose-Capillary 209 (*) 70 - 99 (mg/dL)   Comment 1 Documented in Chart     Comment 2 Notify RN    BASIC METABOLIC PANEL     Status: Abnormal   Collection Time   12/06/10  6:44 AM      Component Value Range   Sodium 151 (*) 135 - 145 (mEq/L)   Potassium 3.8  3.5 - 5.1 (mEq/L)   Chloride 116 (*) 96 - 112 (mEq/L)   CO2 26  19 - 32 (mEq/L)   Glucose, Bld 138 (*) 70 - 99 (mg/dL)   BUN 28 (*) 6 - 23 (mg/dL)   Creatinine, Ser 1.61  0.50 - 1.35 (mg/dL)   Calcium 9.9  8.4 - 09.6 (mg/dL)   GFR calc non Af Amer 65 (*) >90 (mL/min)   GFR calc Af Amer 75 (*) >90 (mL/min)  MAGNESIUM     Status: Normal   Collection Time   12/06/10  6:44 AM      Component Value Range   Magnesium  2.3  1.5 - 2.5 (mg/dL)  PHOSPHORUS     Status: Normal   Collection Time   12/06/10  6:44 AM      Component Value Range   Phosphorus 3.0  2.3 - 4.6 (mg/dL)  GLUCOSE, CAPILLARY     Status: Abnormal   Collection Time   12/06/10  8:15 AM      Component Value Range   Glucose-Capillary 131 (*) 70 - 99 (mg/dL)   Comment 1 Notify RN    GLUCOSE, CAPILLARY     Status: Abnormal   Collection Time   12/06/10 12:17 PM      Component Value Range   Glucose-Capillary 201 (*) 70 - 99 (mg/dL)   Comment 1 Notify RN       Ct Head Wo Contrast  12/06/2010  *RADIOLOGY REPORT*  Clinical Data: Altered mental status.  CT HEAD WITHOUT CONTRAST  Technique:  Contiguous axial images were obtained from the base of the skull through the vertex without contrast.  Comparison: Multiple prior exams most recent is head CT of 12/01/2010.  Findings: Large right hemorrhagic right cerebellar infarct with local mass effect.  Compression of the fourth ventricle.  Upward and downward herniation.  The amount of blood associated with this infarct has progressed slightly since prior exam.   Amount of intraventricular blood has progressed slightly.  Right frontal shunt catheter has been removed with blood seen along the tract of the removed catheter.  Ventricular enlargement similar to minimally more prominent.  Question new right parietal lobe infarct.  IMPRESSION: Large right hemorrhagic right cerebellar infarct with local mass effect.  Compression of the fourth ventricle.  Upward and downward herniation.  The amount of blood associated with this infarct has progressed slightly since prior exam.  Amount of intraventricular blood has progressed slightly.  Right frontal shunt catheter has been removed with blood seen along the tract of the removed catheter.  Ventricular enlargement similar to minimally more prominent.  Question new right parietal lobe infarct.  Original Report Authenticated By: Fuller Canada, M.D.   Dg Chest Port 1 View  12/06/2010  *RADIOLOGY REPORT*  Clinical Data: Verify endotracheal tube placement.  PORTABLE CHEST - 1 VIEW  Comparison: 12/05/2010.  Findings: A tracheostomy tube is in place with the tip midline 5.3 cm above the carina.  No endotracheal tube is in place.  Left central line tip proximal superior vena cava level.  No gross pneumothorax.  Artifact projects over the left lateral chest wall.  Cardiomegaly and pulmonary vascular congestion most notable centrally.  Consolidation left base may be related to artifact and atelectasis. Basilar infiltrate not excluded.  Poor delineation of the aorta.  IMPRESSION: A tracheostomy tube is in place with the tip midline 5.3 cm above the carina.  No endotracheal tube is in place.  Left central line tip proximal superior vena cava level.  Artifact projects over the left lateral chest wall.  Cardiomegaly and pulmonary vascular congestion most notable centrally.  Consolidation left base may be related to artifact and atelectasis. Basilar infiltrate not excluded.  Original Report Authenticated By: Fuller Canada, M.D.   Dg Chest  Port 1 View  12/05/2010  *RADIOLOGY REPORT*  Clinical Data: Tracheostomy tube  PORTABLE CHEST - 1 VIEW  Comparison: Yesterday  Findings: Tracheostomy tube and left PICC are stable.  Moderate cardiomegaly.  Vascular congestion without edema.  Low volumes and basilar atelectasis.  IMPRESSION: Stable bibasilar atelectasis and vascular congestion.  Original Report Authenticated By: Donavan Burnet, M.D.  Physical Exam: awake, but not oriented. follows commands with delay.  Eye movements are full range with end gaze nystagmus on horizontal and vertical gaze.  Face is symmetric without weakness. Tongue is midline. Motor system exam reveals purposeful moments in all 4 limbs distally but significant weakness proximally. Moves right upper and lower limbs more than left.  He is unable to hold tone against gravity.  Deep tendon reflexes are 1+ symmetric.  Plantars downgoing. sensation /coordination /Gait cannot be tested  ASSESSMENT [name] is a 55 y.o. male with a embolic stroke due to atrial fibrillation , and followed by  haemorrhagic conversion - cerebellar bleed  secondary to hypertension and coumadin related hypocoagulabity -used for secondary stroke prevention in a fib.  Stroke risk factors:  Atrial fibrillation, hypertension   Hospital day # 17  TREATMENT/PLAN Encourage mobilisation - discharge next week to SNF  Bleed is slowly changing, CT was positive for a possible new stroke- await MRI in 48 hours .  VS stable .  PEG and Trach for out of hospital nursing home preparation.   Melvyn Novas , M.D.  [facility]  [facility] Pager: [phone] 12/06/2010 2:26 PM

## 2010-12-07 ENCOUNTER — Encounter (HOSPITAL_COMMUNITY): Payer: Self-pay | Admitting: Anesthesiology

## 2010-12-07 ENCOUNTER — Inpatient Hospital Stay (HOSPITAL_COMMUNITY): Payer: 59

## 2010-12-07 ENCOUNTER — Encounter (HOSPITAL_COMMUNITY)
Admission: EM | Disposition: A | Discharge status: Rehabilitation Facility | Payer: Self-pay | Source: Ambulatory Visit | Attending: Pulmonary Disease

## 2010-12-07 DIAGNOSIS — I6340 Cerebral infarction due to embolism of unspecified cerebral artery: Secondary | ICD-10-CM

## 2010-12-07 DIAGNOSIS — J9600 Acute respiratory failure, unspecified whether with hypoxia or hypercapnia: Secondary | ICD-10-CM

## 2010-12-07 DIAGNOSIS — I319 Disease of pericardium, unspecified: Secondary | ICD-10-CM

## 2010-12-07 DIAGNOSIS — R4020 Unspecified coma: Secondary | ICD-10-CM

## 2010-12-07 LAB — BASIC METABOLIC PANEL
BUN: 28 mg/dL — ABNORMAL HIGH (ref 6–23)
Calcium: 10.1 mg/dL (ref 8.4–10.5)
Creatinine, Ser: 1.13 mg/dL (ref 0.50–1.35)
GFR calc Af Amer: 82 mL/min — ABNORMAL LOW (ref >90)
GFR calc non Af Amer: 71 mL/min — ABNORMAL LOW (ref >90)
Glucose, Bld: 183 mg/dL — ABNORMAL HIGH (ref 70–99)
Potassium: 3.8 mEq/L (ref 3.5–5.1)

## 2010-12-07 LAB — BLOOD GAS, ARTERIAL
Acid-Base Excess: 5.2 mmol/L — ABNORMAL HIGH (ref 0.0–2.0)
Drawn by: 23640
FIO2: 0.28 %
O2 Saturation: 97.3 %
pCO2 arterial: 36.1 mmHg (ref 35.0–45.0)

## 2010-12-07 LAB — GLUCOSE, CAPILLARY
Glucose-Capillary: 176 mg/dL — ABNORMAL HIGH (ref 70–99)
Glucose-Capillary: 214 mg/dL — ABNORMAL HIGH (ref 70–99)
Glucose-Capillary: 243 mg/dL — ABNORMAL HIGH (ref 70–99)

## 2010-12-07 LAB — CBC
MCH: 29.5 pg (ref 26.0–34.0)
MCHC: 32.2 g/dL (ref 30.0–36.0)
Platelets: 370 10*3/uL (ref 150–400)
RDW: 14.8 % (ref 11.5–15.5)

## 2010-12-07 LAB — CULTURE, BLOOD (ROUTINE X 2)
Culture  Setup Time: 201211192307
Culture: NO GROWTH
Culture: NO GROWTH

## 2010-12-07 LAB — URINE CULTURE: Colony Count: NO GROWTH

## 2010-12-07 SURGERY — VENTRICULOSTOMY
Anesthesia: General

## 2010-12-07 MED ORDER — SODIUM CHLORIDE 0.9 % IV SOLN
INTRAVENOUS | Status: DC
  Administered 2010-12-07 – 2010-12-13 (×3): via INTRAVENOUS
  Administered 2010-12-14: 20 mL/h via INTRAVENOUS
  Administered 2010-12-16 – 2010-12-17 (×2): via INTRAVENOUS
  Administered 2010-12-20: 20 mL/h via INTRAVENOUS

## 2010-12-07 NOTE — Progress Notes (Signed)
SUBJECTIVE  [name] is a 55 y.o.afro-american right handed male , presenting initially on 11-29-10 as code stroke, but was already on Coumadin. He had a cerebellar stroke, converted to bleed with mass effect - and required FFP.  CT showed a "re-bleed' on 12-02-10 . The patient had a Tracheostoma and ventriculostoma placed, as well as PEG.  Transferred 12-05-10 to step down 3300.  Mental status changes yesterday evaluated by dr Pearlean Brownie - today's CT shows a possible new Ischaemic stroke , see report below. Re-transferred to 3100 on 12-07-10  After becoming progressively unresponsive and having more mass effect on his ventricles - Dr Phoebe Perch is consultant for neur surgery and Dr Tyson Alias has seen patient for CCM with Brett Canales Minor today .   OBJECTIVE Most recent Vital Signs: Temp: 100.3 F (37.9 C) (11/25 1200) Temp src: Oral (11/25 1200) BP: 101/76 mmHg (11/25 1300) Pulse Rate: 96  (11/25 1300) Respiratory Rate: 14 O2 Saturdation: 100%Stroke  .  Hypertensive emergency  .  Pulmonary edema  .  Acute exacerbation of congestive heart failure  .  Hypoxemia  .  Diabetes mellitus  .  Respiratory failure  .  A-fib  .  Gout  .  Encephalopathy  .  CAD (coronary artery disease)  .  Pneumonia, organism unspecified  .  Thyroiditis  .  Hyperlipemia  .  Hypokalemia  .  Volume overload    CBG (last 3)   Basename 12/07/10 1252 12/07/10 0855 12/07/10 0350  GLUCAP 273* 212* 166*   Intake/Output from previous day: 11/24 0701 - 11/25 0700 In: 2652.5 [I.V.:1012.5; NG/GT:140] Out: 3375 [Urine:3025; Stool:350]  IV Fluid Intake:     . dextrose 50 mL/hr at 12/07/10 1300  . feeding supplement (JEVITY 1.2) 1,000 mL (12/06/10 1848)  . DISCONTD: niCARDipine 5 mg/hr (11/28/10 0550)   Diet:    Activity:  DVT Prophylaxis:  Studies: Results for orders placed during the hospital encounter of 11/19/10 (from the past 24 hour(s))  GLUCOSE, CAPILLARY     Status: Abnormal   Collection Time   12/06/10  4:49 PM      Component Value Range   Glucose-Capillary 213 (*) 70 - 99 (mg/dL)   Comment 1 Notify RN    GLUCOSE, CAPILLARY     Status: Abnormal   Collection Time   12/06/10  8:09 PM      Component Value Range   Glucose-Capillary 207 (*) 70 - 99 (mg/dL)   Comment 1 Notify RN     Comment 2 Documented in Chart    GLUCOSE, CAPILLARY     Status: Abnormal   Collection Time   12/06/10 11:35 PM      Component Value Range   Glucose-Capillary 202 (*) 70 - 99 (mg/dL)   Comment 1 Notify RN     Comment 2 Documented in Chart    GLUCOSE, CAPILLARY     Status: Abnormal   Collection Time   12/07/10  3:50 AM      Component Value Range   Glucose-Capillary 166 (*) 70 - 99 (mg/dL)   Comment 1 Notify RN     Comment 2 Documented in Chart    BASIC METABOLIC PANEL     Status: Abnormal   Collection Time   12/07/10  4:30 AM      Component Value Range   Sodium 148 (*) 135 - 145 (mEq/L)   Potassium 3.8  3.5 - 5.1 (mEq/L)   Chloride 110  96 - 112 (mEq/L)   CO2 28  19 - 32 (mEq/L)   Glucose, Bld 183 (*) 70 - 99 (mg/dL)   BUN 28 (*) 6 - 23 (mg/dL)   Creatinine, Ser 1.61  0.50 - 1.35 (mg/dL)   Calcium 09.6  8.4 - 10.5 (mg/dL)   GFR calc non Af Amer 71 (*) >90 (mL/min)   GFR calc Af Amer 82 (*) >90 (mL/min)  CBC     Status: Abnormal   Collection Time   12/07/10  4:30 AM      Component Value Range   WBC 9.5  4.0 - 10.5 (K/uL)   RBC 3.66 (*) 4.22 - 5.81 (MIL/uL)   Hemoglobin 10.8 (*) 13.0 - 17.0 (g/dL)   HCT 04.5 (*) 40.9 - 52.0 (%)   MCV 91.5  78.0 - 100.0 (fL)   MCH 29.5  26.0 - 34.0 (pg)   MCHC 32.2  30.0 - 36.0 (g/dL)   RDW 81.1  91.4 - 78.2 (%)   Platelets 370  150 - 400 (K/uL)  BLOOD GAS, ARTERIAL     Status: Abnormal   Collection Time   12/07/10  6:40 AM      Component Value Range   FIO2 .28     Delivery systems TRACH COLLAR/TRACH TUBE     pH, Arterial 7.508 (*) 7.350 - 7.450    pCO2 arterial 36.1  35.0 - 45.0 (mmHg)   pO2, Arterial 84.1  80.0 - 100.0  (mmHg)   Bicarbonate 28.3 (*) 20.0 - 24.0 (mEq/L)   TCO2 29.3  0 - 100 (mmol/L)   Acid-Base Excess 5.2 (*) 0.0 - 2.0 (mmol/L)   O2 Saturation 97.3     Patient temperature 99.9     Collection site RIGHT RADIAL     Drawn by [phone]     Sample type ARTERIAL DRAW     Allens test (pass/fail) PASS  PASS   GLUCOSE, CAPILLARY     Status: Abnormal   Collection Time   12/07/10  8:55 AM      Component Value Range   Glucose-Capillary 212 (*) 70 - 99 (mg/dL)   Comment 1 Notify RN     Comment 2 Documented in Chart    GLUCOSE, CAPILLARY     Status: Abnormal   Collection Time   12/07/10 12:52 PM      Component Value Range   Glucose-Capillary 273 (*) 70 - 99 (mg/dL)     Ct Head Wo Contrast  12/07/2010  *RADIOLOGY REPORT*  Clinical Data: Worsening lethargy.  Evaluate for hydrocephalus/extension of  bleed.  CT HEAD WITHOUT CONTRAST  Technique:  Contiguous axial images were obtained from the base of the skull through the vertex without contrast.  Comparison: 12/06/2010.  Findings: Large right cerebellar hemorrhagic infarct.  Suggestion of slight increase in amount of hemorrhage associated with this infarct.  Marked mass effect upon the fourth ventricle with compression of the mid brain.  Hydrocephalus.  The degree of hydrocephalus and interventricular blood is relatively similar to the prior exam.  Prior right frontal shunt catheter placement with blood seen along the tract of the shunt catheter.  IMPRESSION: Suggest of slight increased amount of hemorrhage associated with the large right cerebellar infarct.  The degree of mass effect (upon the fourth ventricle and midbrain as well as downward / upward herniation) as well as the degree of hydrocephalus and interventricular blood appear relatively similar to the most recent exam.  Please see above.  Original Report Authenticated By: Fuller Canada, M.D.   Ct Head Wo Contrast  12/06/2010  *  RADIOLOGY REPORT*  Clinical Data: Altered mental status.  CT HEAD  WITHOUT CONTRAST  Technique:  Contiguous axial images were obtained from the base of the skull through the vertex without contrast.  Comparison: Multiple prior exams most recent is head CT of 12/01/2010.  Findings: Large right hemorrhagic right cerebellar infarct with local mass effect.  Compression of the fourth ventricle.  Upward and downward herniation.  The amount of blood associated with this infarct has progressed slightly since prior exam.  Amount of intraventricular blood has progressed slightly.  Right frontal shunt catheter has been removed with blood seen along the tract of the removed catheter.  Ventricular enlargement similar to minimally more prominent.  Question new right parietal lobe infarct.  IMPRESSION: Large right hemorrhagic right cerebellar infarct with local mass effect.  Compression of the fourth ventricle.  Upward and downward herniation.  The amount of blood associated with this infarct has progressed slightly since prior exam.  Amount of intraventricular blood has progressed slightly.  Right frontal shunt catheter has been removed with blood seen along the tract of the removed catheter.  Ventricular enlargement similar to minimally more prominent.  Question new right parietal lobe infarct.  Original Report Authenticated By: Fuller Canada, M.D.   Dg Chest Port 1 View  12/06/2010  *RADIOLOGY REPORT*  Clinical Data: Verify endotracheal tube placement.  PORTABLE CHEST - 1 VIEW  Comparison: 12/05/2010.  Findings: A tracheostomy tube is in place with the tip midline 5.3 cm above the carina.  No endotracheal tube is in place.  Left central line tip proximal superior vena cava level.  No gross pneumothorax.  Artifact projects over the left lateral chest wall.  Cardiomegaly and pulmonary vascular congestion most notable centrally.  Consolidation left base may be related to artifact and atelectasis. Basilar infiltrate not excluded.  Poor delineation of the aorta.  IMPRESSION: A tracheostomy  tube is in place with the tip midline 5.3 cm above the carina.  No endotracheal tube is in place.  Left central line tip proximal superior vena cava level.  Artifact projects over the left lateral chest wall.  Cardiomegaly and pulmonary vascular congestion most notable centrally.  Consolidation left base may be related to artifact and atelectasis. Basilar infiltrate not excluded.  Original Report Authenticated By: Fuller Canada, M.D.   :Physical Exam:  Low BP-  unarousable - very different to yesterday . No spontaneous eye movements, does not open his eyes.  Tongue is midline. Motor system exam reveals no spontaneous movements , and only a left babinski response, no DTR. sensation /coordination /Gait cannot be tested   ASSESSMENT Mr. WALDEN STATZ is a 55 y.o. male in critical condition after  A Cerebellar bleed, now with unresponsives for 18 hours and possible new ischaemic stroke based on CT. The the ischemic stroke may have been present before , but was not appreciated on the initial imaging studies. There is a midbrain herniation by edema.   Patient is not on hot salt - STROKE MD recommended 0.9% NaCl for iv fluids., no glucose.  This patient has high blood glucose levels, Is unarousable , has a left up going toe , no other reflexes obtained.   He has low BP- systolic below 100 mmHg.   Hospital day # 18  TREATMENT/PLAN Suspect increased intracranial pressure- midbrain herniation. .   Obtain MRI for stroke evaluation , if not possible obtain the CT .  No discharge planning at this point- patient is full code. PEG and Trach in  place , but ventriculostomy was removed.   A fib, low BP but no bradycardia.  Dr Tyson Alias feels rightfully that this patient has taken a turn for the worst and has a poor prognosis.  I reviewed the CT from this AM -repeat imaging studies.  Place on Hot salt for midbrain herniation, caused  by edema not directly by bleed. D/C dextrose immediately.   Dr Herma Carson. was  called for CCM and informed of my plans.  Brother at bedside.  Dr Lovell Sheehan for NS was called  to see the repeat scan - CT head of his  patient.       Melvyn Novas , M.D. [facility], [facility] Pager: [phone] 12/07/2010 1:25 PM

## 2010-12-07 NOTE — Progress Notes (Signed)
[name] is a 55 y.o. male former smoker admitted on 11/19/2010 with acute onset of slurred speech and Lt sided weakness. Dx Cerebellar ischemic cva then converted to bleed.  PMHx DM, CAD, CHF, HTN, Gout, A fib.  Line/tubes: ETT 11/7>>11/9, 11/10 (cerebellar hge) >>11/16 Trach (ENT) 11/16>>> Ventric 11/10 (dr Hrisch emergent) >>>11/20 R [place] TLC 11/7>>>11/17 R PICC line 11/17>>> 11/21 peg [name]>>  Cx: Blood 11/8>>Neg Sputum 11/8>>Moderate Staph aureus. Blood 11/12>>> neg x2 Urine 11/12>>>NTD Sputum 11/12: abundant GPC pairs/abundant WBC>>>NF 11/19 BC x 2 (fever104) >>> Csf 11/19>>>gram stain neg>>>neg Sputum 11/19 > neg C diff 11/21 > neg Sputum 11/24 >>> BC x2 11/24 >>> UC 11/24 >>>   Abx: Rocephin 11/8>>11/12 Zithromax 11/8>>11/12 Zosyn 11/12>>>11/19 Vancomycin 11/12>>>11/16 vanc ( fever 104, ivc) 11/19>>>11/21 ceftaz 11/19 (CSF ) 11/19>>>11/21  Best practice: Pepcid DVT  PAS and LMWH  Consults: Neuro Cardiology [name]) [name]>>>11/20  Tests/events: 11/7: CT head>>negative 11/7: CT chest>>b/l GGO more at bases, small b/l effusions, no PE, dependent ATX 11/8: MRI head w/o contrast>>Acute/subacute non-hemorrhagic infarct Rt superior cerebellar artery territory, remote Lt cerebellar infarct, multiple punctated areas of remote hemorrhage compatible with amyloid angiopathy 11/8: Echo>>severe LVH, EF 25 to 35%, mild AR, mild MR, PAS 11/10 - CT head am: marked hgic transformation of rt cerebaellar infarct -> supratentorial hydrocephalus -> ventric drain 11/10 - GCS 5 then reintubated. 11/18 CT head>>>Right cerebellar infarct with improving hemorrhage. Associated<BR>cerebellar edema with effacement of the 4th ventricle and upward<BR>transtentorial herniation, unchanged.<BR> <BR>Right frontal approach ventriculostomy catheter with<BR>ventriculomegaly, stable versus mildly improved.<BR> <BR>Layering hemorrhage in the posterior horns of the  lateral<BR>ventricles, mildly increased.<BR> 11/19 CT head>>>Right cerebellar intraparenchymal hemorrhage measures slightly<BR>larger at 2 cm. Increased surrounding edema. Persistent upward<BR>transtentorial herniation, effacement of the fourth ventricle, and<BR>mild downward tonsillar herniation.<BR> <BR>Right transfrontal approach intraventricular catheter. There is<BR>persistent prominence of the lateral ventricles and third ventricle<BR>with layering hemorrhage within the occipital horns of the lateral<BR>ventricles.<BR>> 11/19 - high fevers 11/20- improved fever and neurostatus 11/21 PEG 11/21 - no vent 24 hr 11/23 tx to sdu 11/24 re-ct Head (ams) >>Large right hemorrhagic right cerebellar infarct with local mass effect. Compression of the fourth ventricle. Upward and downward herniation. The amount of blood associated with this infarct has  progressed slightly since prior exam. Amount of intraventricular blood has progressed slightly.  Right frontal shunt catheter has been removed with blood seen along the tract of the removed catheter.Ventricular enlargement similar to minimally more prominent.Question new right parietal lobe infarct.  11/25 decreased loc , transfer to 3100, repeat ct head. NS revaluation.   SUBJECTIVE:  Decreased LOC, tx to ICU/repeat ct of head.  OBJECTIVE:  Temp:  [99.3 F (37.4 C)-101.9 F (38.8 C)] 99.9 F (37.7 C) (11/25 0345) Pulse Rate:  [57-111] 65  (11/25 0600) Resp:  [16-32] 29  (11/25 0600) BP: (101-183)/(65-124) 164/106 mmHg (11/25 0600) SpO2:  [97 %-100 %] 98 % (11/25 0600) FiO2 (%):  [0.3 %-28 %] 0.3 % (11/25 0418) Weight:  [215 lb 13.3 oz (97.9 kg)] 215 lb 13.3 oz (97.9 kg) (11/25 0420)   Intake/Output Summary (Last 24 hours) at 12/07/10 0700 Last data filed at 12/07/10 0600  Gross per 24 hour  Intake 2652.5 ml  Output   3375 ml  Net -722.5 ml   General - obese  ENT: - Pupils reactive err, disconjugate gaze, Cardiac RRR, Nl S1/S2,  -M Chest - Coarse improved Abd - soft, non-tender, ND and +BS, peg in place clean Ext - no edema  Neuro - obtunded,  opens eyes to deep sternal rub, no focus or follow.    Lab 12/07/10 0430 12/06/10 0644 12/05/10 0415  NA 148* 151* 147*  K 3.8 3.8 2.8*  CL 110 116* 119*  CO2 28 26 23   BUN 28* 28* 27*  CREATININE 1.13 1.22 0.87  GLUCOSE 183* 138* 170*  ]  Lab 12/07/10 0430 12/05/10 0415 12/02/10 0545  HGB 10.8* 8.6* 11.5*  HCT 33.5* 26.9* 35.8*  WBC 9.5 7.5 10.6*  PLT 370 299 289  }    ASSESSMENT/PLAN:  1) Cerebellar Ischemic infarct  with hemorrhage transformation/Coma  -collar x 24 hours per day,  Off hypertonic saline per neuro. Prognosis remains very poor. .  -11/25 tx back to ICU, repeat ct head. , back on vent for now    2) Acute resp failure: Due to inability to protect airway 11/19  PLAN: -full vent support 11/25 due to decreased loc     3) A. Fib: Heparin stopped following cerebellar hemorrhage 11/21/10 now on lovenox. PLAN - Monitor.tele   4) Hypertension: PLAN: - PRN fentanyl for pain   clonodine 0.3 Improvd with  increased clonodine, could increase further if needed No ACEI still, has poor affect overall BP controll anyway and easliy into ARF  5) h/o CAD Plan: - Monitor  6) H/O Gout Plan: If flare, then steroids iv is DOC in house  7) [id number] 11/24 Reculture   8) Thyroiditis Plan: - Sick euthyroid likely   9) Acute renal failure, resolved Plan: Dcd  ACEI Chem in am  Lasix to even to slight neg  Lab Results  Component Value Date   CREATININE 1.13 12/07/2010   CREATININE 1.22 12/06/2010   CREATININE 0.87 12/05/2010     10) Hypernatremia. New brain edema Plan 3 % per Neurology, now off.  Lab Results  Component Value Date   NA 148* 12/07/2010   NA 151* 12/06/2010   NA 147* 12/05/2010   trending back up with fever so will rx gently with free water  11) Hyperglycemia Plan: - SSI, maintainlantus  Very little to offer that  will restore health here - discussed limited prognosis with brother at bedside and risk of over treating fever (resistant organisms) per Dr Sherene Sires 11/24

## 2010-12-07 NOTE — Consult Note (Signed)
Reason for Consult: Decreased mental status Referring Physician: Dr. Porfirio Mylar Dohmier   [name] is an 55 y.o. male.  HPI: The patient is a 55 year old black male who was admitted with a cerebellar stroke/hemorrhage. The patient was treated with a ventriculostomy. He has improved somewhat. Patient had decreased mental status today and was worked up with a head CT. A neurosurgical consultation was requested  Presently the patient is accompanied by his brothers to tell me that he is at his pre-decreased mental status baseline.  Past Medical History  Diagnosis Date  . Diabetes mellitus   . Coronary artery disease   . Hypertension   . Gout   . CHF (congestive heart failure)   . Afib   . Arthritis     Past Surgical History  Procedure Date  . Tracheostomy tube placement 11/28/2010    Procedure: TRACHEOSTOMY;  Surgeon: Susy Frizzle, MD;  Location: [facility] OR;  Service: ENT;  Laterality: N/A;    History reviewed. No pertinent family history.  Social History:  reports that he quit smoking about 4 years ago. His smoking use included Cigarettes. He does not have any smokeless tobacco history on file. He reports that he drinks about 6.6 ounces of alcohol per week. He reports that he does not use illicit drugs.  Allergies: No Known Allergies  Medications:  Prior to Admission:  Prescriptions prior to admission  Medication Sig Dispense Refill  . allopurinol (ZYLOPRIM) 300 MG tablet Take 300 mg by mouth daily.        Marland Kitchen amLODipine (NORVASC) 5 MG tablet Take 5 mg by mouth daily.        . AMOXICILLIN PO Take 400 mg by mouth 2 (two) times daily.       Marland Kitchen aspirin 81 MG tablet Take 81 mg by mouth daily.        . carvedilol (COREG) 25 MG tablet Take 25 mg by mouth 2 (two) times daily with a meal.        . celecoxib (CELEBREX) 200 MG capsule Take 200 mg by mouth 2 (two) times daily.        . cloNIDine (CATAPRES) 0.1 MG tablet Take 0.1 mg by mouth 2 (two) times daily.        . colchicine 0.6 MG  tablet Take 0.6 mg by mouth daily. For gout       . cyclobenzaprine (FLEXERIL) 10 MG tablet Take 5-10 mg by mouth 2 (two) times daily as needed. For muscle spasms       . furosemide (LASIX) 20 MG tablet Take 10-20 mg by mouth 2 (two) times daily. For pressure and fluid       . isosorbide-hydrALAZINE (BIDIL) 20-37.5 MG per tablet Take 1 tablet by mouth 3 (three) times daily.        . metFORMIN (GLUCOPHAGE-XR) 500 MG 24 hr tablet Take 1,000 mg by mouth 2 (two) times daily.        . Multiple Vitamins-Minerals (MULTIVITAMINS THER. W/MINERALS) TABS Take 1 tablet by mouth daily.        . naproxen sodium (ANAPROX) 220 MG tablet Take 220 mg by mouth 2 (two) times daily as needed. For pain       . ramipril (ALTACE) 10 MG capsule Take 10 mg by mouth 2 (two) times daily.        Marland Kitchen spironolactone (ALDACTONE) 50 MG tablet Take 50 mg by mouth 2 (two) times daily.        Marland Kitchen warfarin (COUMADIN) 7.5  MG tablet Take 7.5 mg by mouth daily.        Marland Kitchen DISCONTD: naproxen sodium (ANAPROX) 220 MG tablet Take 220 mg by mouth as needed.       Marland Kitchen DISCONTD: colchicine 0.6 MG tablet Take 0.6 mg by mouth daily.       Marland Kitchen DISCONTD: cyclobenzaprine (FLEXERIL) 10 MG tablet Take 10 mg by mouth 2 (two) times a week.        Marland Kitchen DISCONTD: furosemide (LASIX) 20 MG tablet Take 20 mg by mouth daily.       Marland Kitchen DISCONTD: metFORMIN (GLUCOPHAGE) 500 MG tablet Take 500 mg by mouth 2 (two) times daily with a meal.        . DISCONTD: Multiple Vitamin (MULTIVITAMIN) capsule Take 1 capsule by mouth daily.        Marland Kitchen DISCONTD: ramipril (ALTACE) 10 MG tablet Take 10 mg by mouth daily.       Marland Kitchen DISCONTD: spironolactone (ALDACTONE) 50 MG tablet Take 50 mg by mouth daily.        Scheduled:   . antiseptic oral rinse  15 mL Mouth Rinse QID  . chlorhexidine  15 mL Mouth Rinse BID  . diltiazem  30 mg Per Tube Q6H  . famotidine  20 mg Per Tube Every 12 hours (non-specified)  . insulin aspart  0-7 Units Subcutaneous Q4H  . insulin glargine  20 Units Subcutaneous  BID  . isosorbide-hydrALAZINE  1 tablet Oral TID  . rosuvastatin  5 mg Oral q1800  . sodium chloride  10 mL Intracatheter Q12H  . DISCONTD: amLODipine  10 mg Oral Daily  . DISCONTD: carvedilol  25 mg Oral BID WC  . DISCONTD: cloNIDine  0.3 mg Oral TID   Continuous:   . sodium chloride 75 mL/hr at 12/07/10 1700  . feeding supplement (JEVITY 1.2) 1,000 mL (12/07/10 1631)  . DISCONTD: dextrose 50 mL/hr at 12/07/10 1300   WJX:BJYNWGNFAOZHY (TYLENOL) oral liquid 160 mg/5 mL, hydrALAZINE, metoprolol, sodium chloride, DISCONTD: fentaNYL Anti-infectives     Start     Dose/Rate Route Frequency Ordered Stop   12/03/10 1400   cefTAZidime (FORTAZ) 1 g in dextrose 5 % 50 mL IVPB  Status:  Discontinued        1 g 100 mL/hr over 30 Minutes Intravenous  Once 12/02/10 1437 12/03/10 1212   12/01/10 1100   vancomycin (VANCOCIN) 1,250 mg in sodium chloride 0.9 % 250 mL IVPB  Status:  Discontinued        1,250 mg 166.7 mL/hr over 90 Minutes Intravenous Every 12 hours 12/01/10 1036 12/03/10 1212   12/01/10 1100   cefTAZidime (FORTAZ) 1 g in dextrose 5 % 50 mL IVPB  Status:  Discontinued        1 g 100 mL/hr over 30 Minutes Intravenous Every 8 hours 12/01/10 1036 12/02/10 1437   12/01/10 0930   cefTAZidime (FORTAZ) injection 1 g  Status:  Discontinued     Comments: Make this IV now and pharmacy to dose NOT IM      1 g Intramuscular 3 times per day 12/01/10 0932 12/01/10 0938   12/01/10 0930   vancomycin (VANCOCIN) IVPB 1000 mg/200 mL premix  Status:  Discontinued        1,000 mg 200 mL/hr over 60 Minutes Intravenous Every 12 hours 12/01/10 0932 12/01/10 0938   11/24/10 1400   vancomycin (VANCOCIN) 1,250 mg in sodium chloride 0.9 % 250 mL IVPB  Status:  Discontinued  1,250 mg 166.7 mL/hr over 90 Minutes Intravenous Every 12 hours 11/24/10 1327 11/28/10 1427   11/24/10 1400   piperacillin-tazobactam (ZOSYN) IVPB 3.375 g  Status:  Discontinued        3.375 g 12.5 mL/hr over 240 Minutes  Intravenous 3 times per day 11/24/10 1327 12/01/10 0932   11/20/10 1000   cefTRIAXone (ROCEPHIN) 1 g in dextrose 5 % 50 mL IVPB  Status:  Discontinued        1 g 100 mL/hr over 30 Minutes Intravenous Every 24 hours 11/20/10 0847 11/24/10 1115   11/20/10 0915   azithromycin (ZITHROMAX) 500 mg in dextrose 5 % 250 mL IVPB  Status:  Discontinued        500 mg 250 mL/hr over 60 Minutes Intravenous Every 24 hours 11/20/10 0825 11/24/10 1115   11/20/10 0830   cefTRIAXone (ROCEPHIN) injection 1 g  Status:  Discontinued        1 g Intramuscular Every 24 hours 11/20/10 0825 11/20/10 0847          Results for orders placed during the hospital encounter of 11/19/10 (from the past 48 hour(s))  GLUCOSE, CAPILLARY     Status: Abnormal   Collection Time   12/05/10  7:53 PM      Component Value Range Comment   Glucose-Capillary 197 (*) 70 - 99 (mg/dL)    Comment 1 Documented in Chart      Comment 2 Notify RN     GLUCOSE, CAPILLARY     Status: Abnormal   Collection Time   12/05/10 11:51 PM      Component Value Range Comment   Glucose-Capillary 174 (*) 70 - 99 (mg/dL)    Comment 1 Documented in Chart      Comment 2 Notify RN     GLUCOSE, CAPILLARY     Status: Abnormal   Collection Time   12/06/10  3:52 AM      Component Value Range Comment   Glucose-Capillary 209 (*) 70 - 99 (mg/dL)    Comment 1 Documented in Chart      Comment 2 Notify RN     BASIC METABOLIC PANEL     Status: Abnormal   Collection Time   12/06/10  6:44 AM      Component Value Range Comment   Sodium 151 (*) 135 - 145 (mEq/L)    Potassium 3.8  3.5 - 5.1 (mEq/L) DELTA CHECK NOTED   Chloride 116 (*) 96 - 112 (mEq/L)    CO2 26  19 - 32 (mEq/L)    Glucose, Bld 138 (*) 70 - 99 (mg/dL)    BUN 28 (*) 6 - 23 (mg/dL)    Creatinine, Ser 1.61  0.50 - 1.35 (mg/dL)    Calcium 9.9  8.4 - 10.5 (mg/dL)    GFR calc non Af Amer 65 (*) >90 (mL/min)    GFR calc Af Amer 75 (*) >90 (mL/min)   MAGNESIUM     Status: Normal   Collection  Time   12/06/10  6:44 AM      Component Value Range Comment   Magnesium 2.3  1.5 - 2.5 (mg/dL)   PHOSPHORUS     Status: Normal   Collection Time   12/06/10  6:44 AM      Component Value Range Comment   Phosphorus 3.0  2.3 - 4.6 (mg/dL)   GLUCOSE, CAPILLARY     Status: Abnormal   Collection Time   12/06/10  8:15 AM  Component Value Range Comment   Glucose-Capillary 131 (*) 70 - 99 (mg/dL)    Comment 1 Notify RN     CULTURE, RESPIRATORY     Status: Normal (Preliminary result)   Collection Time   12/06/10 11:32 AM      Component Value Range Comment   Specimen Description TRACHEAL ASPIRATE      Special Requests NONE      Gram Stain        Value: MODERATE WBC PRESENT, PREDOMINANTLY PMN     RARE SQUAMOUS EPITHELIAL CELLS PRESENT     ABUNDANT GRAM POSITIVE COCCI IN PAIRS     FEW GRAM NEGATIVE RODS   Culture Culture reincubated for better growth      Report Status PENDING     CULTURE, BLOOD (ROUTINE X 2)     Status: Normal (Preliminary result)   Collection Time   12/06/10 11:45 AM      Component Value Range Comment   Specimen Description BLOOD ARM RIGHT      Special Requests BOTTLES DRAWN AEROBIC AND ANAEROBIC 10CC EACH      Setup Time [id number]      Culture        Value:        BLOOD CULTURE RECEIVED NO GROWTH TO DATE CULTURE WILL BE HELD FOR 5 DAYS BEFORE ISSUING A FINAL NEGATIVE REPORT   Report Status PENDING     URINE CULTURE     Status: Normal   Collection Time   12/06/10 11:51 AM      Component Value Range Comment   Specimen Description URINE, CATHETERIZED      Special Requests NONE      Setup Time [id number]      Colony Count NO GROWTH      Culture NO GROWTH      Report Status 12/07/2010 FINAL     CULTURE, BLOOD (ROUTINE X 2)     Status: Normal (Preliminary result)   Collection Time   12/06/10 12:10 PM      Component Value Range Comment   Specimen Description BLOOD HAND RIGHT      Special Requests BOTTLES DRAWN AEROBIC AND ANAEROBIC 10CC EACH      Setup  Time [id number]      Culture        Value:        BLOOD CULTURE RECEIVED NO GROWTH TO DATE CULTURE WILL BE HELD FOR 5 DAYS BEFORE ISSUING A FINAL NEGATIVE REPORT   Report Status PENDING     GLUCOSE, CAPILLARY     Status: Abnormal   Collection Time   12/06/10 12:17 PM      Component Value Range Comment   Glucose-Capillary 201 (*) 70 - 99 (mg/dL)    Comment 1 Notify RN     GLUCOSE, CAPILLARY     Status: Abnormal   Collection Time   12/06/10  4:49 PM      Component Value Range Comment   Glucose-Capillary 213 (*) 70 - 99 (mg/dL)    Comment 1 Notify RN     GLUCOSE, CAPILLARY     Status: Abnormal   Collection Time   12/06/10  8:09 PM      Component Value Range Comment   Glucose-Capillary 207 (*) 70 - 99 (mg/dL)    Comment 1 Notify RN      Comment 2 Documented in Chart     GLUCOSE, CAPILLARY     Status: Abnormal   Collection Time   12/06/10 11:35 PM  Component Value Range Comment   Glucose-Capillary 202 (*) 70 - 99 (mg/dL)    Comment 1 Notify RN      Comment 2 Documented in Chart     GLUCOSE, CAPILLARY     Status: Abnormal   Collection Time   12/07/10  3:50 AM      Component Value Range Comment   Glucose-Capillary 166 (*) 70 - 99 (mg/dL)    Comment 1 Notify RN      Comment 2 Documented in Chart     BASIC METABOLIC PANEL     Status: Abnormal   Collection Time   12/07/10  4:30 AM      Component Value Range Comment   Sodium 148 (*) 135 - 145 (mEq/L)    Potassium 3.8  3.5 - 5.1 (mEq/L)    Chloride 110  96 - 112 (mEq/L)    CO2 28  19 - 32 (mEq/L)    Glucose, Bld 183 (*) 70 - 99 (mg/dL)    BUN 28 (*) 6 - 23 (mg/dL)    Creatinine, Ser 5.36  0.50 - 1.35 (mg/dL)    Calcium 64.4  8.4 - 10.5 (mg/dL)    GFR calc non Af Amer 71 (*) >90 (mL/min)    GFR calc Af Amer 82 (*) >90 (mL/min)   CBC     Status: Abnormal   Collection Time   12/07/10  4:30 AM      Component Value Range Comment   WBC 9.5  4.0 - 10.5 (K/uL)    RBC 3.66 (*) 4.22 - 5.81 (MIL/uL)    Hemoglobin 10.8 (*) 13.0  - 17.0 (g/dL)    HCT 03.4 (*) 74.2 - 52.0 (%)    MCV 91.5  78.0 - 100.0 (fL)    MCH 29.5  26.0 - 34.0 (pg)    MCHC 32.2  30.0 - 36.0 (g/dL)    RDW 59.5  63.8 - 75.6 (%)    Platelets 370  150 - 400 (K/uL)   BLOOD GAS, ARTERIAL     Status: Abnormal   Collection Time   12/07/10  6:40 AM      Component Value Range Comment   FIO2 .28      Delivery systems TRACH COLLAR/TRACH TUBE      pH, Arterial 7.508 (*) 7.350 - 7.450     pCO2 arterial 36.1  35.0 - 45.0 (mmHg)    pO2, Arterial 84.1  80.0 - 100.0 (mmHg)    Bicarbonate 28.3 (*) 20.0 - 24.0 (mEq/L)    TCO2 29.3  0 - 100 (mmol/L)    Acid-Base Excess 5.2 (*) 0.0 - 2.0 (mmol/L)    O2 Saturation 97.3      Patient temperature 99.9      Collection site RIGHT RADIAL      Drawn by [phone]      Sample type ARTERIAL DRAW      Allens test (pass/fail) PASS  PASS    GLUCOSE, CAPILLARY     Status: Abnormal   Collection Time   12/07/10  8:55 AM      Component Value Range Comment   Glucose-Capillary 212 (*) 70 - 99 (mg/dL)    Comment 1 Notify RN      Comment 2 Documented in Chart     GLUCOSE, CAPILLARY     Status: Abnormal   Collection Time   12/07/10 12:52 PM      Component Value Range Comment   Glucose-Capillary 273 (*) 70 - 99 (mg/dL)   GLUCOSE,  CAPILLARY     Status: Abnormal   Collection Time   12/07/10  4:21 PM      Component Value Range Comment   Glucose-Capillary 243 (*) 70 - 99 (mg/dL)     Ct Head Wo Contrast  12/07/2010  **ADDENDUM** CREATED: 12/07/2010 16:03:40  Report reviewed by Dr. Richardean Chimera  and discussed with [name] patient's nurse 12/07/2010 4:00 p.m.  **END ADDENDUM** SIGNED BY: Almedia Balls. Constance Goltz, M.D.   12/07/2010  *RADIOLOGY REPORT*  Clinical Data: Worsening lethargy.  Evaluate for hydrocephalus/extension of  bleed.  CT HEAD WITHOUT CONTRAST  Technique:  Contiguous axial images were obtained from the base of the skull through the vertex without contrast.  Comparison: 12/06/2010.  Findings: Large right cerebellar hemorrhagic  infarct.  Suggestion of slight increase in amount of hemorrhage associated with this infarct.  Marked mass effect upon the fourth ventricle with compression of the mid brain.  Hydrocephalus.  The degree of hydrocephalus and interventricular blood is relatively similar to the prior exam.  Prior right frontal shunt catheter placement with blood seen along the tract of the shunt catheter.  IMPRESSION: Suggest of slight increased amount of hemorrhage associated with the large right cerebellar infarct.  The degree of mass effect (upon the fourth ventricle and midbrain as well as downward / upward herniation) as well as the degree of hydrocephalus and interventricular blood appear relatively similar to the most recent exam.  Please see above.  Original Report Authenticated By: Fuller Canada, M.D.   Ct Head Wo Contrast  12/06/2010  *RADIOLOGY REPORT*  Clinical Data: Altered mental status.  CT HEAD WITHOUT CONTRAST  Technique:  Contiguous axial images were obtained from the base of the skull through the vertex without contrast.  Comparison: Multiple prior exams most recent is head CT of 12/01/2010.  Findings: Large right hemorrhagic right cerebellar infarct with local mass effect.  Compression of the fourth ventricle.  Upward and downward herniation.  The amount of blood associated with this infarct has progressed slightly since prior exam.  Amount of intraventricular blood has progressed slightly.  Right frontal shunt catheter has been removed with blood seen along the tract of the removed catheter.  Ventricular enlargement similar to minimally more prominent.  Question new right parietal lobe infarct.  IMPRESSION: Large right hemorrhagic right cerebellar infarct with local mass effect.  Compression of the fourth ventricle.  Upward and downward herniation.  The amount of blood associated with this infarct has progressed slightly since prior exam.  Amount of intraventricular blood has progressed slightly.  Right  frontal shunt catheter has been removed with blood seen along the tract of the removed catheter.  Ventricular enlargement similar to minimally more prominent.  Question new right parietal lobe infarct.  Original Report Authenticated By: Fuller Canada, M.D.   Dg Chest Port 1 View  12/06/2010  *RADIOLOGY REPORT*  Clinical Data: Verify endotracheal tube placement.  PORTABLE CHEST - 1 VIEW  Comparison: 12/05/2010.  Findings: A tracheostomy tube is in place with the tip midline 5.3 cm above the carina.  No endotracheal tube is in place.  Left central line tip proximal superior vena cava level.  No gross pneumothorax.  Artifact projects over the left lateral chest wall.  Cardiomegaly and pulmonary vascular congestion most notable centrally.  Consolidation left base may be related to artifact and atelectasis. Basilar infiltrate not excluded.  Poor delineation of the aorta.  IMPRESSION: A tracheostomy tube is in place with the tip midline 5.3 cm above the carina.  No  endotracheal tube is in place.  Left central line tip proximal superior vena cava level.  Artifact projects over the left lateral chest wall.  Cardiomegaly and pulmonary vascular congestion most notable centrally.  Consolidation left base may be related to artifact and atelectasis. Basilar infiltrate not excluded.  Original Report Authenticated By: Fuller Canada, M.D.    Review of systems: Unobtainable Blood pressure 129/99, pulse 86, temperature 99.7 F (37.6 C), temperature source Oral, resp. rate 20, height 6' (1.829 m), weight 97.9 kg (215 lb 13.3 oz), SpO2 100.00%. Physical exam:  Gen. a 55 year old black male with a tracheostomy on a ventilator who is alert and interactive.  HEENT: The patient's pupils were equal round reactive light, somewhat myotic. He has conjugate gaze. His scalp incisions are healing well with staples in place.  Neurologic exam: The patient is Glasgow Coma Scale 11 trached (E4 M6V1) the patient follows commands by  squeezing my fingers bilaterally and moving his feet. He will stick out his tongue to command.  Imaging studies: I reviewed the patient's head CT performed 12/07/2010 at [facility] without contrast. It is essentially unchanged as previous head CT of 12/01/2010. He has a large right cerebellar hemorrhagic infarction. There is some mass effect on the right midbrain. There is no significant hydrocephalus.   Assessment/Plan: Decreased mental status: Perhaps the cause of this was metabolic as the patient was recently taken off the ventilator. He is currently at his baseline and doesn't need a ventriculostomy.  Michah Minton D 12/07/2010, 5:52 PM

## 2010-12-07 NOTE — Progress Notes (Signed)
Pt not responding to painful stimuli; MD notified; stat ABG ordered; will continue to monitor

## 2010-12-07 NOTE — Progress Notes (Signed)
eLink Physician-Brief Progress Note Patient Name: [name] DOB: Apr 30, 1955 MRN: [id number]  Date of Service  12/07/2010   HPI/Events of Note  Unresposniove  Now. Reviewed days prior CT head concnerned now with more dilated vents?  Increase cva?  eICU Interventions  Move to 3100, re vent, STAT CT HEAD I have NP Minor at bedide now for bedside assessment. He will ocontact neuro/ NS at needed abg vent   Intervention Category Major Interventions: Respiratory failure - evaluation and management;Change in mental status - evaluation and management Intermediate Interventions: Diagnostic test evaluation  Nelda Bucks. 12/07/2010, 6:48 AM

## 2010-12-07 NOTE — Progress Notes (Signed)
10 mg Hydralazine IV given

## 2010-12-07 NOTE — Progress Notes (Addendum)
Late entry, CCM MD on call called re brother's concern re pt being less responsive, pt does open eyes to noxious stimuli, pupils remain 3 equal and sluggish, is not presently following commands.  Patient's temp has also been elevated at 101.3 this am at 0800, treated with Tylenol 650mg , has had some diaphoresis, axillary temp now is 101.9, will decrease temp in room and lighten his covers.  Dr. Sherene Sires to see

## 2010-12-07 NOTE — Progress Notes (Signed)
   Dr Lovell Sheehan was called and kindly  reviewed the CT - he sees no major changes but  is willing to  reinsert the ventriculostomy and in 12 hours, if no improvement in mental status / spontaneous movements observed , will start hot salt. D/c head of bed elevation in patient with hydrocephalus. D/c dextrose, started Normal saline at 0.9% only .  long meeting with 2 brothers and the niece. Poor prognosis explained, patient may not survive the next 48 hours.  CD

## 2010-12-08 ENCOUNTER — Inpatient Hospital Stay (HOSPITAL_COMMUNITY): Payer: 59

## 2010-12-08 DIAGNOSIS — J9600 Acute respiratory failure, unspecified whether with hypoxia or hypercapnia: Secondary | ICD-10-CM

## 2010-12-08 DIAGNOSIS — I6340 Cerebral infarction due to embolism of unspecified cerebral artery: Secondary | ICD-10-CM

## 2010-12-08 DIAGNOSIS — I629 Nontraumatic intracranial hemorrhage, unspecified: Secondary | ICD-10-CM

## 2010-12-08 DIAGNOSIS — R4182 Altered mental status, unspecified: Secondary | ICD-10-CM

## 2010-12-08 LAB — BASIC METABOLIC PANEL
CO2: 28 mEq/L (ref 19–32)
Chloride: 110 mEq/L (ref 96–112)
GFR calc Af Amer: 89 mL/min — ABNORMAL LOW (ref >90)
Potassium: 3.4 mEq/L — ABNORMAL LOW (ref 3.5–5.1)
Sodium: 147 mEq/L — ABNORMAL HIGH (ref 135–145)

## 2010-12-08 LAB — CBC
Platelets: 356 10*3/uL (ref 150–400)
RBC: 3.62 MIL/uL — ABNORMAL LOW (ref 4.22–5.81)
RDW: 14.3 % (ref 11.5–15.5)
WBC: 7.9 10*3/uL (ref 4.0–10.5)

## 2010-12-08 LAB — CULTURE, RESPIRATORY W GRAM STAIN

## 2010-12-08 LAB — GLUCOSE, CAPILLARY
Glucose-Capillary: 212 mg/dL — ABNORMAL HIGH (ref 70–99)
Glucose-Capillary: 151 mg/dL — ABNORMAL HIGH (ref 70–99)
Glucose-Capillary: 209 mg/dL — ABNORMAL HIGH (ref 70–99)

## 2010-12-08 MED ORDER — ENOXAPARIN SODIUM 40 MG/0.4ML ~~LOC~~ SOLN: 40.0000 mg | SUBCUTANEOUS | Status: DC

## 2010-12-08 MED ORDER — INSULIN GLARGINE 100 UNIT/ML ~~LOC~~ SOLN
20.0000 [IU] | Freq: Two times a day (BID) | SUBCUTANEOUS | Status: DC
  Administered 2010-12-08 – 2010-12-22 (×29): 20 [IU] via SUBCUTANEOUS
  Filled 2010-12-08 (×2): qty 3

## 2010-12-08 MED ORDER — DILTIAZEM 12 MG/ML ORAL SUSPENSION
45.0000 mg | Freq: Four times a day (QID) | ORAL | Status: DC
  Administered 2010-12-08 – 2010-12-09 (×4): 45 mg
  Filled 2010-12-08 (×8): qty 6

## 2010-12-08 MED ORDER — INSULIN ASPART 100 UNIT/ML ~~LOC~~ SOLN
0.0000 [IU] | SUBCUTANEOUS | Status: DC
  Administered 2010-12-08: 7 [IU] via SUBCUTANEOUS
  Administered 2010-12-08: 4 [IU] via SUBCUTANEOUS
  Administered 2010-12-08 – 2010-12-09 (×2): 7 [IU] via SUBCUTANEOUS
  Administered 2010-12-09: 3 [IU] via SUBCUTANEOUS
  Administered 2010-12-09 (×2): 4 [IU] via SUBCUTANEOUS
  Administered 2010-12-09: 7 [IU] via SUBCUTANEOUS
  Administered 2010-12-09: 11 [IU] via SUBCUTANEOUS
  Administered 2010-12-10: 7 [IU] via SUBCUTANEOUS
  Administered 2010-12-10: 4 [IU] via SUBCUTANEOUS
  Administered 2010-12-10: 3 [IU] via SUBCUTANEOUS
  Administered 2010-12-10 (×2): 4 [IU] via SUBCUTANEOUS
  Administered 2010-12-11 – 2010-12-12 (×9): 3 [IU] via SUBCUTANEOUS
  Administered 2010-12-13: 4 [IU] via SUBCUTANEOUS
  Administered 2010-12-13 (×2): 3 [IU] via SUBCUTANEOUS
  Administered 2010-12-13: 4 [IU] via SUBCUTANEOUS
  Administered 2010-12-14 – 2010-12-15 (×7): 3 [IU] via SUBCUTANEOUS
  Administered 2010-12-15: 4 [IU] via SUBCUTANEOUS
  Administered 2010-12-16 (×2): 3 [IU] via SUBCUTANEOUS
  Administered 2010-12-16 (×2): 4 [IU] via SUBCUTANEOUS
  Administered 2010-12-17 (×2): 3 [IU] via SUBCUTANEOUS
  Administered 2010-12-17 (×2): 4 [IU] via SUBCUTANEOUS
  Administered 2010-12-18 – 2010-12-19 (×6): 3 [IU] via SUBCUTANEOUS
  Administered 2010-12-19 (×3): 4 [IU] via SUBCUTANEOUS
  Administered 2010-12-20: 3 [IU] via SUBCUTANEOUS
  Administered 2010-12-20 (×2): 4 [IU] via SUBCUTANEOUS
  Administered 2010-12-20 – 2010-12-21 (×2): 3 [IU] via SUBCUTANEOUS
  Administered 2010-12-21 (×2): 4 [IU] via SUBCUTANEOUS
  Administered 2010-12-22 (×3): 3 [IU] via SUBCUTANEOUS
  Filled 2010-12-08: qty 3

## 2010-12-08 MED ORDER — ENOXAPARIN SODIUM 40 MG/0.4ML ~~LOC~~ SOLN
40.0000 mg | SUBCUTANEOUS | Status: DC
  Administered 2010-12-08 – 2010-12-11 (×4): 40 mg via SUBCUTANEOUS
  Filled 2010-12-08 (×5): qty 0.4

## 2010-12-08 NOTE — Progress Notes (Signed)
Pt not on sedation, resp states failed SBT, a.e.b. Increased resp rate >30 and heart rate > 130

## 2010-12-08 NOTE — Progress Notes (Signed)
Stroke Team Progress Note  SUBJECTIVE  [name] is a 55 y.o. male whose stroke event occurred on 11/29/10  Presenting as code stroke, but was already on Coumadin. He had a cerebellar stroke, converted to bleed with mass effect - and required FFP.  CT showed a "re-bleed' on 12-02-10 . The patient had a Tracheostoma and ventriculostoma placed, as well as PEG. Transferred 12-05-10 to step down 3300. Mental status changes 12/06/10 evaluated by dr Pearlean Brownie, CT showed a possible new Ischaemic stroke. Transferred back to 3100 on 12-07-10 after becoming progressively unresponsive and having more mass effect on his ventricles - Dr Phoebe Perch consultant for neurosurgery and CCM for reintubation.He also had resp distress and after ventilatory support mental status has improved but he has not been able to wean off ventilator yet. Remains intubated today.  Brothers at bedside.  OBJECTIVE Most recent Vital Signs: Temp: 97.3 F (36.3 C) (11/26 0300) Temp src: Oral (11/26 0300) BP: 159/114 mmHg (11/26 0800) Pulse Rate: 62  (11/26 0800) Respiratory Rate: 18 O2 Saturdation: 100%  CBG (last 3)   Basename 12/08/10 0304 12/07/10 2349 12/07/10 1925  GLUCAP 212* 214* 176*   Intake/Output from previous day: 11/25 0701 - 11/26 0700 In: 3153.8 [I.V.:1513.8; NG/GT:290] Out: 2102 [Urine:2102]  IV Fluid Intake:     . sodium chloride 75 mL/hr at 12/08/10 0456  . feeding supplement (JEVITY 1.2) 1,000 mL (12/07/10 1631)   Diet:  none  Activity:  bedrest  DVT Prophylaxis:  SCDs   Studies: CBC    Component Value Date/Time   WBC 7.9 12/08/2010 0515   RBC 3.62* 12/08/2010 0515   HGB 10.0* 12/08/2010 0515   HCT 33.2* 12/08/2010 0515   PLT 356 12/08/2010 0515   MCV 91.7 12/08/2010 0515   MCH 27.6 12/08/2010 0515   MCHC 30.1 12/08/2010 0515   RDW 14.3 12/08/2010 0515   CMP    Component Value Date/Time   NA 147* 12/08/2010 0515   K 3.4* 12/08/2010 0515   CL 110 12/08/2010 0515   CO2 28 12/08/2010  0515   GLUCOSE 207* 12/08/2010 0515   BUN 33* 12/08/2010 0515   CREATININE 1.06 12/08/2010 0515   CALCIUM 10.0 12/08/2010 0515   GFRNONAA 77* 12/08/2010 0515   GFRAA 89* 12/08/2010 0515     Ct Head Wo Contrast 12/07/2010   Suggest of slight increased amount of hemorrhage associated with the large right cerebellar infarct.  The degree of mass effect (upon the fourth ventricle and midbrain as well as downward / upward herniation) as well as the degree of hydrocephalus and interventricular blood appear relatively similar to the most recent exam.    Ct Head Wo Contrast 12/06/2010  Large right hemorrhagic right cerebellar infarct with local mass effect.  Compression of the fourth ventricle.  Upward and downward herniation.  The amount of blood associated with this infarct has progressed slightly since prior exam.  Amount of intraventricular blood has progressed slightly.  Right frontal shunt catheter has been removed with blood seen along the tract of the removed catheter.  Ventricular enlargement similar to minimally more prominent.  Question new right parietal lobe infarct age indeterminate.    Dg Chest Port 1 View 12/08/2010  Improving bibasilar airspace disease.    Dg Chest Port 1 View 12/06/2010  A tracheostomy tube is in place with the tip midline 5.3 cm above the carina.  No endotracheal tube is in place.  Left central line tip proximal superior vena cava level.  Artifact projects over  the left lateral chest wall.  Cardiomegaly and pulmonary vascular congestion most notable centrally.  Consolidation left base may be related to artifact and atelectasis. Basilar infiltrate not excluded.    Physical Exam:   Awake alert. Neck is supple without bruit. Distal pulses are felt. Cardiac exam atrial fibrillation rapid heart rate. Lungs clear to auscultation.  Neurological exam awake follows commands quite well. Eye movements are full range with second degree dysarthria horizontally. Blinks to threat  bilaterally. Minimal left lower facial weakness. Moves all 4 extremities against gravity but there is mild left-sided weakness. He is unable to hold extremities up against gravity. There is drift in all 4 extremities. Both plantars are downgoing. ASSESSMENT [name] is a 55 y.o. male with a right hemispheric right brain cardioembolic infarcts secondary to atrial fibrillation. Post hospitalization hemorrhage. Now has had tracheostomy and PEG placement. Off anticoagulants/antiplatelets at this time secondary to hemorrhage for secondary stroke prevention. Now intubated secondary to mucus plugging. For extubation. No new stroke or neuro worsening.Ct shows rt pareital low density which is age indeterminate and likely not the cause of recent worsening over the week end which was more likely a respiratory event from mucus plugging. Stroke risk factors:  atrial fibrillation, diabetes mellitus, hyperlipidemia and CAD  Hospital day # 19  TREATMENT/PLAN Continue vent wean per CCM. No need vor ventriculostomy or change in neuro care. Discussed with brother and Dr Bard Herbert.  Joaquin Music, ANP-BC, GNP-BC [facility] Pager: [phone] 12/08/2010 8:14 AM  Dr. Delia Heady, Stroke Center Medical Director, has personally reviewed chart, pertinent data, examined the patient and developed the plan of care.

## 2010-12-08 NOTE — Progress Notes (Signed)
CSW reviewed chart and note pt decline. CSW continues to provide support to family and will continue to follow.

## 2010-12-08 NOTE — Progress Notes (Signed)
[name] is a 55 y.o. male former smoker admitted on 11/19/2010 with acute onset of slurred speech and Lt sided weakness. Dx Cerebellar ischemic cva then converted to bleed. Transferred to SDU 11/24 but returned to ICU 11/25 with decreased LOC which resolved with reinitiation of mechanical ventilation  PMHx DM, CAD, CHF, HTN, Gout, A fib.  Line/tubes: ETT 11/7>>11/9, 11/10 (cerebellar hge) >>11/16 Ventric 11/10 (dr Sherlon Handing emergent) >>>11/20 R [place] TLC 11/7 >>11/17 Trach (ENT) 11/16>>> R PICC line 11/17 >> 11/21 peg Brodie >>  Cx: Blood 11/8>>Neg Sputum 11/8>>Moderate Staph aureus. Blood 11/12>>> neg x2 Urine 11/12>>>NTD Sputum 11/12: abundant GPC pairs/abundant WBC>>>NF 11/19 BC x 2 (fever104) >>> Csf 11/19>>>gram stain neg>>>neg Sputum 11/19 > neg C diff 11/21 > neg Sputum 11/24 >>> mod beta hemolytic strep BC x2 11/24 >>> ngtd 11/26 >> UC 11/24 >>> NEG   Abx: Rocephin 11/8>>11/12 Zithromax 11/8>>11/12 Zosyn 11/12>>>11/19 Vancomycin 11/12>>>11/16 vanc ( fever 104, ivc) 11/19>>>11/21 ceftaz 11/19 (CSF ) 11/19>>>11/21  Best practice: Pepcid DVT  PAS and LMWH  Consults: Neuro Cardiology [name]) [name]>>>11/20  Tests/events: 11/7: CT head>>negative 11/7: CT chest>>b/l GGO more at bases, small b/l effusions, no PE, dependent ATX 11/8: MRI head w/o contrast>>Acute/subacute non-hemorrhagic infarct Rt superior cerebellar artery territory, remote Lt cerebellar infarct, multiple punctated areas of remote hemorrhage compatible with amyloid angiopathy 11/8: Echo>>severe LVH, EF 25 to 35%, mild AR, mild MR, PAS 11/10 - CT head am: marked hgic transformation of rt cerebaellar infarct -> supratentorial hydrocephalus -> ventric drain 11/10 - GCS 5 then reintubated. 11/18 CT head>>>Right cerebellar infarct with improving hemorrhage. Associated cerebellar edema with effacement of the 4th ventricle and upward transtentorial herniation, unchanged. Right frontal approach  ventriculostomy catheter with ventriculomegaly, stable versus mildly improved. Layering hemorrhage in the posterior horns of the lateral ventricles, mildly increased. 11/19 CT head>>>Right cerebellar intraparenchymal hemorrhage measures slightly larger at 2 cm. Increased surrounding edema. Persistent upward transtentorial herniation, effacement of the fourth ventricle, and mild downward tonsillar herniation. Right transfrontal approach intraventricular catheter. There is persistent prominence of the lateral ventricles and third ventricle with layering hemorrhage within the occipital horns of the lateral ventricles. 11/19 - high fevers 11/20- improved fever and neuro status 11/23 - off vent X 72 hrs tx to sdu 11/24 AMS >> re-ct Head: Large right hemorrhagic right cerebellar infarct with local mass effect. Compression of the fourth ventricle. Upward and downward herniation. The amount of blood associated with this infarct has progressed slightly since prior exam. Amount of intraventricular blood has progressed slightly.  Right frontal shunt catheter has been removed with blood seen along the tract of the removed catheter.Ventricular enlargement similar to minimally more prominent.Question new right parietal lobe infarct. (NS and Stroke MDs consider minimal change) 11/25 decreased loc , transfer to 3100, repeat ct head NSC.    SUBJECTIVE:  RASS 0. Answers questions. + F/C  OBJECTIVE:  Temp:  [97.3 F (36.3 C)-99.8 F (37.7 C)] 99.8 F (37.7 C) (11/26 1200) Pulse Rate:  [51-112] 60  (11/26 1200) Resp:  [12-27] 24  (11/26 1200) BP: (91-173)/(56-130) 153/120 mmHg (11/26 1200) SpO2:  [99 %-100 %] 99 % (11/26 1200) FiO2 (%):  [28 %-40.3 %] 28 % (11/26 1138) Weight:  [95.6 kg (210 lb 12.2 oz)] 210 lb 12.2 oz (95.6 kg) (11/26 0100)   Intake/Output Summary (Last 24 hours) at 12/08/10 1301 Last data filed at 12/08/10 1100  Gross per 24 hour  Intake 2828.75 ml  Output   1802 ml  Net 1026.75  ml    General - NAD  ENT: - Pupils reactive, OMI Cardiac RRR, Nl S1/S2, -M Chest - Clear anteriorly Abd - soft, non-tender, ND and +BS, PEG site clean Ext - no edema     Lab 12/08/10 0515 12/07/10 0430 12/06/10 0644  NA 147* 148* 151*  K 3.4* 3.8 3.8  CL 110 110 116*  CO2 28 28 26   BUN 33* 28* 28*  CREATININE 1.06 1.13 1.22  GLUCOSE 207* 183* 138*    Lab 12/08/10 0515 12/07/10 0430 12/05/10 0415  HGB 10.0* 10.8* 8.6*  HCT 33.2* 33.5* 26.9*  WBC 7.9 9.5 7.5  PLT 356 370 299      ASSESSMENT/PLAN:  1) Cerebellar Ischemic infarct  with hemorrhage transformation/Coma  Cognition has returned to previous baseline. No intervention planned by Stroke or NS  2) Acute resp failure: Resume vent wean   3) A. Fib: Heparin stopped following cerebellar hemorrhage 11/21/10 now on lovenox @ DVT prophylaxis doses. OK to continue per Stroke Service. Rate and Htn poorly controlled. Have increased diltiazem  4) Hypertension: Diltiazem increased 11/26. Cont rest as is. Cont PRN hydralazine  5) h/o CAD Plan: - Monitor  6) H/O Gout Inactive  7) Fever102 11/24 -resolved. Follow off abx. F/U cx data   8) Acute renal failure, resolved  Lab Results  Component Value Date   CREATININE 1.06 12/08/2010   CREATININE 1.13 12/07/2010   CREATININE 1.22 12/06/2010     10) Hypernatremia. Mild. Given recent CVA/edema, will not correct  Lab Results  Component Value Date   NA 147* 12/08/2010   NA 148* 12/07/2010   NA 151* 12/06/2010    11) Hyperglycemia Insulin regimen clarified  Brother updated in detail. Discussed with Dr Pearlean Brownie 30 minutes CCM time  Billy Fischer, MD;  PCCM service; Mobile [phone]

## 2010-12-08 NOTE — Progress Notes (Signed)
Nutrition Follow-up  Meds:     . antiseptic oral rinse  15 mL Mouth Rinse QID  . chlorhexidine  15 mL Mouth Rinse BID  . diltiazem  45 mg Per Tube Q6H  . enoxaparin (LOVENOX) injection  40 mg Subcutaneous Q24H  . famotidine  20 mg Per Tube Every 12 hours (non-specified)  . insulin aspart  0-20 Units Subcutaneous Q4H  . insulin glargine  20 Units Subcutaneous BID  . isosorbide-hydrALAZINE  1 tablet Oral TID  . rosuvastatin  5 mg Oral q1800  . sodium chloride  10 mL Intracatheter Q12H  . DISCONTD: amLODipine  10 mg Oral Daily  . DISCONTD: carvedilol  25 mg Oral BID WC  . DISCONTD: cloNIDine  0.3 mg Oral TID  . DISCONTD: diltiazem  30 mg Per Tube Q6H  . DISCONTD: enoxaparin (LOVENOX) injection  40 mg Subcutaneous Q24H  . DISCONTD: insulin aspart  0-7 Units Subcutaneous Q4H  . DISCONTD: insulin glargine  20 Units Subcutaneous BID    Labs: BMET    Component Value Date/Time   NA 147* 12/08/2010 0515   K 3.4* 12/08/2010 0515   CL 110 12/08/2010 0515   CO2 28 12/08/2010 0515   GLUCOSE 207* 12/08/2010 0515   BUN 33* 12/08/2010 0515   CREATININE 1.06 12/08/2010 0515   CALCIUM 10.0 12/08/2010 0515   GFRNONAA 77* 12/08/2010 0515   GFRAA 89* 12/08/2010 0515    Wt. Status:  210lbs. Wt stable  Diet: NPO, TF order changed per MD to jevity 1.2 @ 15ml/hr provides 1728kcals/53gmProt/780ml H2O meeting approx. 90-95% energy/ 35% prot needs Estimated nutr needs Kcals: 1750-1925 Prot: greater than equal to 145gm/day Fluid: 49ml/kcal Per MD concern for residuals greater than however per EN protocol TF only to be held if residuals greater .  Current TF regimen not appropriate to meet needs. Pt would likely benefit from more concentrated formula.Clydie Braun dx: Inadequate oral intake- ongoing  Goal: Meet greater than or equal to 60%energy/100%prot needs- not met.   Intervention: 1. Recommend change TF to pivot 1.5 goal rate of 51ml/hr to provide 1260kcals/79gmprot/643ml H2O.  2. Add  prostat 64 30ml five times daily to provide additional 360kcals/75gmprot to better meet prot needs  Total kcals 1620/154gm Prot to meet approx. 85% energy and 100%prot needs.   Monitor: TF tolerance/adequacy, wt trends, labs.   Pager #: [phone]

## 2010-12-09 ENCOUNTER — Encounter (HOSPITAL_COMMUNITY): Payer: Self-pay | Admitting: Internal Medicine

## 2010-12-09 DIAGNOSIS — I509 Heart failure, unspecified: Secondary | ICD-10-CM | POA: Diagnosis present

## 2010-12-09 DIAGNOSIS — Z930 Tracheostomy status: Secondary | ICD-10-CM

## 2010-12-09 DIAGNOSIS — I4891 Unspecified atrial fibrillation: Secondary | ICD-10-CM

## 2010-12-09 DIAGNOSIS — Z9911 Dependence on respirator [ventilator] status: Secondary | ICD-10-CM

## 2010-12-09 LAB — BASIC METABOLIC PANEL
BUN: 27 mg/dL — ABNORMAL HIGH (ref 6–23)
Calcium: 9.7 mg/dL (ref 8.4–10.5)
Creatinine, Ser: 1.1 mg/dL (ref 0.50–1.35)
GFR calc non Af Amer: 73 mL/min — ABNORMAL LOW (ref >90)
Glucose, Bld: 240 mg/dL — ABNORMAL HIGH (ref 70–99)

## 2010-12-09 LAB — BLOOD GAS, ARTERIAL
Drawn by: 320991
FIO2: 0.28 %
O2 Saturation: 98.5 %
Patient temperature: 98.6

## 2010-12-09 LAB — GLUCOSE, CAPILLARY
Glucose-Capillary: 228 mg/dL — ABNORMAL HIGH (ref 70–99)
Glucose-Capillary: 284 mg/dL — ABNORMAL HIGH (ref 70–99)

## 2010-12-09 MED ORDER — FENTANYL CITRATE 0.05 MG/ML IJ SOLN
25.0000 ug | INTRAMUSCULAR | Status: DC | PRN
  Administered 2010-12-10 – 2010-12-15 (×2): 50 ug via INTRAVENOUS
  Administered 2010-12-15: 25 ug via INTRAVENOUS
  Administered 2010-12-16: 100 ug via INTRAVENOUS
  Administered 2010-12-16: 50 ug via INTRAVENOUS
  Administered 2010-12-16: 25 ug via INTRAVENOUS
  Administered 2010-12-16: 75 ug via INTRAVENOUS
  Filled 2010-12-09 (×8): qty 2

## 2010-12-09 MED ORDER — INSULIN ASPART 100 UNIT/ML ~~LOC~~ SOLN
4.0000 [IU] | SUBCUTANEOUS | Status: DC
  Administered 2010-12-09 – 2010-12-22 (×74): 4 [IU] via SUBCUTANEOUS
  Filled 2010-12-09: qty 3

## 2010-12-09 MED ORDER — ASPIRIN 81 MG PO CHEW
CHEWABLE_TABLET | ORAL | Status: AC
  Administered 2010-12-09: 81 mg
  Filled 2010-12-09: qty 1

## 2010-12-09 MED ORDER — ASPIRIN EC 81 MG PO TBEC
81.0000 mg | DELAYED_RELEASE_TABLET | Freq: Every day | ORAL | Status: DC
  Administered 2010-12-09 – 2010-12-12 (×4): 81 mg via ORAL
  Filled 2010-12-09 (×5): qty 1

## 2010-12-09 MED ORDER — DILTIAZEM 12 MG/ML ORAL SUSPENSION
60.0000 mg | Freq: Four times a day (QID) | ORAL | Status: DC
  Administered 2010-12-09 – 2010-12-16 (×28): 60 mg
  Filled 2010-12-09 (×34): qty 6

## 2010-12-09 MED ORDER — POTASSIUM CHLORIDE 20 MEQ/15ML (10%) PO LIQD
40.0000 meq | Freq: Once | ORAL | Status: AC
  Administered 2010-12-09: 40 meq
  Filled 2010-12-09: qty 30

## 2010-12-09 MED ORDER — FUROSEMIDE 10 MG/ML IJ SOLN
40.0000 mg | Freq: Once | INTRAMUSCULAR | Status: AC
  Administered 2010-12-09: 40 mg via INTRAVENOUS
  Filled 2010-12-09: qty 4

## 2010-12-09 NOTE — Progress Notes (Signed)
Inpatient Diabetes Program Recommendations  AACE/ADA: New Consensus Statement on Inpatient Glycemic Control (2009)  Target Ranges:  Prepandial:   less than 140 mg/dL      Peak postprandial:   less than 180 mg/dL (1-2 hours)      Critically ill patients:  140 - 180 mg/dL   Reason for Visit: Elevated glucose: 162, 151, 209, 222, 284, 228 mg/dL in last 24 hours  Inpatient Diabetes Program Recommendations Insulin - Meal Coverage: Add Tube feed coverage Novolog 4 units q4hrs

## 2010-12-09 NOTE — Progress Notes (Signed)
Stroke Team Progress Note  SUBJECTIVE HEIDI LEMAY is a 55 y.o. male whose stroke event occurred on 11/29/10. Presented as code stroke, but was already on Coumadin. He had a cerebellar stroke, converted to bleed with mass effect - and required FFP on 12-02-10 . The patient had a Tracheostoma and ventriculostomy placed, as well as PEG. Transferred 12-05-10 to step down 3300. Mental status changes 12/06/10 evaluated by dr Pearlean Brownie, Transferred back to 3100 on 12-07-10 after becoming progressively unresponsive and having more mass effect on his ventricles - Dr Phoebe Perch consultant for neurosurgery and CCM for reintubation. He also had resp distress and after ventilatory support mental status has improved    . Brother at bedside. Pt more awake earlier this am per Greig Castilla, RN.he was on ventilatory support last night and has been on trach collar since this morning and has been doing well OBJECTIVE Most recent Vital Signs: Temp: 99.2 F (37.3 C) (11/27 0400) Temp src: Oral (11/27 0400) BP: 178/107 mmHg (11/27 0748) Pulse Rate: 112  (11/27 0748) Respiratory Rate: 21 O2 Saturdation: 100%  CBG (last 3)   Basename 12/09/10 0354 12/09/10 0023 12/08/10 2017  GLUCAP 228* 284* 222*   Intake/Output from previous day: 11/26 0701 - 11/27 0700 In: 2470 [I.V.:950; NG/GT:260] Out: 2085 [Urine:1685; Stool:400]  IV Fluid Intake:     . sodium chloride 20 mL/hr at 12/09/10 0600  . feeding supplement (JEVITY 1.2) 1,000 mL (12/09/10 0446)   Diet:   NPO, tube feedings  Activity: Bedrest  DVT Prophylaxis:  Lovenox 40 mg sq daily   Studies: Results for orders placed during the hospital encounter of 11/19/10 (from the past 24 hour(s))  BASIC METABOLIC PANEL     Status: Abnormal   Collection Time   12/09/10  4:00 AM      Component Value Range   Sodium 145  135 - 145 (mEq/L)   Potassium 3.7  3.5 - 5.1 (mEq/L)   Chloride 108  96 - 112 (mEq/L)   CO2 28  19 - 32 (mEq/L)   Glucose, Bld 240 (*) 70 - 99 (mg/dL)   BUN 27 (*) 6 - 23 (mg/dL)   Creatinine, Ser 1.61  0.50 - 1.35 (mg/dL)   Calcium 9.7  8.4 - 09.6 (mg/dL)   GFR calc non Af Amer 73 (*) >90 (mL/min)   GFR calc Af Amer 85 (*) >90 (mL/min)     Ct Head Wo Contrast 12/07/2010 Suggest of slight increased amount of hemorrhage associated with the large right cerebellar infarct.  The degree of mass effect (upon the fourth ventricle and midbrain as well as downward / upward herniation) as well as the degree of hydrocephalus and interventricular blood appear relatively similar to the most recent exam.  Please see above.    Dg Chest Port 1 View 12/08/2010 Improving bibasilar airspace    Physical Exam:   Drowsy. Neck is supple without bruit. Distal pulses are felt. Cardiac exam atrial fibrillation rapid heart rate. Lungs clear to auscultation.  Neurological exam drowsy arouses barely and  Follows few  commands only.Eye movements are full range with saccadic dysmetria horizontally. Blinks to threat bilaterally. Minimal left lower facial weakness. Moves all 4 extremities against gravity but there is mild left-sided weakness. He is unable to hold extremities up against gravity. There is drift in all 4 extremities. Both plantars are downgoing.    ASSESSMENT Mr. DOMANIK RAINVILLE is a 55 y.o. male with a right hemispheric right brain cardioembolic infarcts secondary to atrial fibrillation.Post Hospitalization  heparin related hemorrhage. Now has had tracheostomy and PEG placement. Off anticoagulants/antiplatelets at this time secondary to hemorrhage for secondary stroke prevention. Now intubated secondary to mucus plugging. No new stroke or neuro worsening, more likely a respiratory event from mucus plugging.  Stroke risk factors:  atrial fibrillation, diabetes mellitus, hyperlipidemia and CAD  Hospital day # 20  TREATMENT/PLAN ASA 81 daily. CCM following for extubation. Plan SNF once medically stable.D/ W brother and answered questions.  Joaquin Music,  ANP-BC, GNP-BC [facility] Pager: [phone] 12/09/2010 8:00 AM  Dr. Delia Heady, Stroke Center Medical Director, has personally reviewed chart, pertinent data, examined the patient and developed the plan of care.

## 2010-12-09 NOTE — Progress Notes (Signed)
CSW faxing pt out to skilled facilities in [facility], per pt family request. Seeking facility in that area that will accept pt with trach and peg, when pt is medically stable. CSW will continue to follow.  Baxter Flattery, MSW [phone]

## 2010-12-09 NOTE — Progress Notes (Signed)
[name] is a 55 y.o. male former smoker admitted on 11/19/2010 with acute onset of slurred speech and Lt sided weakness. Dx Cerebellar ischemic cva then converted to bleed. Transferred to SDU 11/24 but returned to ICU 11/25 with decreased LOC which resolved with reinitiation of mechanical ventilation.   PMHx DM, CAD, CHF, HTN, Gout, A fib.  Line/tubes: ETT 11/7>>11/9, 11/10 (cerebellar hge) >>11/16 Ventric 11/10 (dr Sherlon Handing emergent) >>>11/20 R [place] TLC 11/7 >>11/17 Trach (ENT) 11/16>>> R PICC line 11/17 >> 11/21 peg Brodie >>  Cx: Blood 11/8>>Neg Sputum 11/8>>Moderate Staph aureus. Blood 11/12>>> neg x2 Urine 11/12>>>NTD Sputum 11/12: abundant GPC pairs/abundant WBC>>>NF 11/19 BC x 2 (fever104) >>> Csf 11/19>>>gram stain neg>>>neg Sputum 11/19 > neg C diff 11/21 > neg Sputum 11/24 >>> mod beta hemolytic strep BC x2 11/24 >>> ngtd 11/26 >> UC 11/24 >>> NEG   Abx: Rocephin 11/8>>11/12 Zithromax 11/8>>11/12 Zosyn 11/12>>>11/19 Vancomycin 11/12>>>11/16 vanc ( fever 104, ivc) 11/19>>>11/21 ceftaz 11/19 (CSF ) 11/19>>>11/21  Best practice: Pepcid DVT  PAS and LMWH  Consults: Neuro Cardiology [name]) [name]>>>11/20  Tests/events: 11/7: CT head>>negative 11/7: CT chest>>b/l GGO more at bases, small b/l effusions, no PE, dependent ATX 11/8: MRI head w/o contrast>>Acute/subacute non-hemorrhagic infarct Rt superior cerebellar artery territory, remote Lt cerebellar infarct, multiple punctated areas of remote hemorrhage compatible with amyloid angiopathy 11/8: Echo>>severe LVH, EF 25 to 35%, mild AR, mild MR, PAS 11/10 - CT head am: marked hgic transformation of rt cerebaellar infarct -> supratentorial hydrocephalus -> ventric drain 11/10 - GCS 5 then reintubated. 11/18 CT head>>>Right cerebellar infarct with improving hemorrhage. Associated cerebellar edema with effacement of the 4th ventricle and upward transtentorial herniation, unchanged. Right frontal  approach ventriculostomy catheter with ventriculomegaly, stable versus mildly improved. Layering hemorrhage in the posterior horns of the lateral ventricles, mildly increased. 11/19 CT head>>>Right cerebellar intraparenchymal hemorrhage measures slightly larger at 2 cm. Increased surrounding edema. Persistent upward transtentorial herniation, effacement of the fourth ventricle, and mild downward tonsillar herniation. Right transfrontal approach intraventricular catheter. There is persistent prominence of the lateral ventricles and third ventricle with layering hemorrhage within the occipital horns of the lateral ventricles. 11/19 - high fevers 11/20- improved fever and neuro status 11/23 - off vent X 72 hrs tx to sdu 11/24 AMS >> re-ct Head: Large right hemorrhagic right cerebellar infarct with local mass effect. Compression of the fourth ventricle. Upward and downward herniation. The amount of blood associated with this infarct has progressed slightly since prior exam. Amount of intraventricular blood has progressed slightly.  Right frontal shunt catheter has been removed with blood seen along the tract of the removed catheter.Ventricular enlargement similar to minimally more prominent.Question new right parietal lobe infarct. (NS and Stroke MDs consider minimal change) 11/25 decreased loc , transfer to 3100, repeat ct head NSC.    SUBJECTIVE:  Lethargic on TC. No overt distress. Was able to F/C earlier but presently RASS -2 and not F/C  OBJECTIVE:  Temp:  [99.1 F (37.3 C)-100.7 F (38.2 C)] 99.2 F (37.3 C) (11/27 0400) Pulse Rate:  [50-128] 103  (11/27 0930) Resp:  [14-27] 19  (11/27 0930) BP: (121-200)/(78-145) 125/92 mmHg (11/27 0930) SpO2:  [98 %-100 %] 98 % (11/27 0930) FiO2 (%):  [28 %-30.4 %] 28 % (11/27 0800) Weight:  [96.6 kg (212 lb 15.4 oz)] 212 lb 15.4 oz (96.6 kg) (11/27 0600)   Intake/Output Summary (Last 24 hours) at 12/09/10 0946 Last data filed at 12/09/10 0900  Gross  per 24  hour  Intake   2545 ml  Output   2585 ml  Net    -40 ml   General - NAD  ENT: - Pupils reactive, OMI Cardiac RRR, Nl S1/S2, -M Chest - Clear anteriorly Abd - soft, non-tender, ND and +BS, PEG site clean Ext - no edema     Lab 12/09/10 0400 12/08/10 0515 12/07/10 0430  NA 145 147* 148*  K 3.7 3.4* 3.8  CL 108 110 110  CO2 28 28 28   BUN 27* 33* 28*  CREATININE 1.10 1.06 1.13  GLUCOSE 240* 207* 183*    Lab 12/08/10 0515 12/07/10 0430 12/05/10 0415  HGB 10.0* 10.8* 8.6*  HCT 33.2* 33.5* 26.9*  WBC 7.9 9.5 7.5  PLT 356 370 299      ASSESSMENT/PLAN:  1) Cerebellar Ischemic infarct  with hemorrhage transformation/Coma  Cognition has returned to previous baseline. No intervention planned by Stroke or NS  2) Acute resp failure: Check ABG on TC to ensure he is not hypercapneic   3) A. Fib: Heparin stopped following cerebellar hemorrhage 11/21/10 now on lovenox @ DVT prophylaxis doses. OK to continue per Stroke Service. Rate and Htn remain poorly controlled. Increase diltiazem dose further  4) Hypertension: Diltiazem increased. Cont rest as is. Cont PRN hydralazine  5) h/o CAD Plan: - Monitor  6) H/O Gout Inactive  7) Fever102 11/24 -resolved. Follow off abx. F/U cx data   8) Acute renal failure, resolved  Lab Results  Component Value Date   CREATININE 1.10 12/09/2010   CREATININE 1.06 12/08/2010   CREATININE 1.13 12/07/2010     10) Hypernatremia. Mild. Given recent CVA/edema, will not correct  Lab Results  Component Value Date   NA 145 12/09/2010   NA 147* 12/08/2010   NA 148* 12/07/2010    11) Hyperglycemia Add TF coverage for CBGs > 180  Should be stable for transfer to 2600 which will allow for resumption of vent support if needed 30 minutes CCM time  Billy Fischer, MD;  PCCM service; Mobile [phone]

## 2010-12-09 NOTE — Progress Notes (Signed)
12/09/10 07:31 PT Note Noted patient's medical decline with patient now being ventilated.  PT orders have been discontinued.  Signing off.  Please reorder if/when needed.  Thanks.   12/09/2010 Cephus Shelling, PT, DPT [phone]

## 2010-12-09 NOTE — Progress Notes (Signed)
eLink Physician-Brief Progress Note Patient Name: [name] DOB: [birth year] MRN: [id number]  Date of Service  12/09/2010   HPI/Events of Note   Call from nurse reporting that patient appears to be in pain, grimacing with elevated HR and BP  eICU Interventions  Fentanyl 25 to 75 mcg IV q4 hours prn pain   Intervention Category Major Interventions: Respiratory failure - evaluation and management;Change in mental status - evaluation and management Intermediate Interventions: Pain - evaluation and management  Venessa Wickham 12/09/2010, 6:37 AM

## 2010-12-10 LAB — BASIC METABOLIC PANEL
BUN: 23 mg/dL (ref 6–23)
Chloride: 107 mEq/L (ref 96–112)
GFR calc Af Amer: >90 mL/min (ref >90)
Glucose, Bld: 172 mg/dL — ABNORMAL HIGH (ref 70–99)
Potassium: 3.4 mEq/L — ABNORMAL LOW (ref 3.5–5.1)

## 2010-12-10 LAB — GLUCOSE, CAPILLARY
Glucose-Capillary: 105 mg/dL — ABNORMAL HIGH (ref 70–99)
Glucose-Capillary: 115 mg/dL — ABNORMAL HIGH (ref 70–99)
Glucose-Capillary: 128 mg/dL — ABNORMAL HIGH (ref 70–99)
Glucose-Capillary: 151 mg/dL — ABNORMAL HIGH (ref 70–99)
Glucose-Capillary: 173 mg/dL — ABNORMAL HIGH (ref 70–99)
Glucose-Capillary: 173 mg/dL — ABNORMAL HIGH (ref 70–99)
Glucose-Capillary: 204 mg/dL — ABNORMAL HIGH (ref 70–99)

## 2010-12-10 MED ORDER — CARVEDILOL 25 MG PO TABS
25.0000 mg | ORAL_TABLET | Freq: Two times a day (BID) | ORAL | Status: DC
  Administered 2010-12-10 – 2010-12-14 (×10): 25 mg via ORAL
  Filled 2010-12-10 (×14): qty 1

## 2010-12-10 MED ORDER — RAMIPRIL 10 MG PO CAPS
10.0000 mg | ORAL_CAPSULE | Freq: Every day | ORAL | Status: DC
  Administered 2010-12-10 – 2010-12-14 (×5): 10 mg via ORAL
  Filled 2010-12-10 (×6): qty 1

## 2010-12-10 MED ORDER — METFORMIN HCL ER 500 MG PO TB24
1000.0000 mg | ORAL_TABLET | Freq: Two times a day (BID) | ORAL | Status: DC
  Administered 2010-12-10 – 2010-12-11 (×4): 1000 mg via ORAL
  Filled 2010-12-10 (×7): qty 2

## 2010-12-10 MED ORDER — RAMIPRIL 10 MG PO TABS
10.0000 mg | ORAL_TABLET | Freq: Every day | ORAL | Status: DC
  Filled 2010-12-10 (×2): qty 1

## 2010-12-10 NOTE — Progress Notes (Signed)
Stroke Team Progress Note  SUBJECTIVE [name] who has been on trach collar since yesterday now for more than 24 hours. Has order for xfer to 2600. Brother at bedside. Brother and RN both report increased sedation after prn hydralazine.  OBJECTIVE Most recent Vital Signs: Temp: 98.5 F (36.9 C) (11/28 0335) Temp src: Oral (11/28 0335) BP: 137/92 mmHg (11/28 0900) Pulse Rate: 53  (11/28 0900) Respiratory Rate: 18 O2 Saturdation: 100%  CBG (last 3)   Basename 12/10/10 0334 12/10/10 0016 12/09/10 2004  GLUCAP 173* 151* 164*   Intake/Output from previous day: 11/27 0701 - 11/28 0700 In: 2289 [I.V.:480; NG/GT:305; IV Piggyback:4] Out: 3240 [Urine:3240]  IV Fluid Intake      . sodium chloride 20 mL/hr at 12/10/10 0700  . feeding supplement (JEVITY 1.2) 1,000 mL (12/09/10 2108)   Diet    NPO, panda tube feedings at 50/hr  Activity  Bedrest  DVT Prophylaxis  Lovenox 40 mg sq daily   Studies Results for orders placed during the hospital encounter of 11/19/10 (from the past 24 hour(s))  BLOOD GAS, ARTERIAL     Status: Abnormal   Collection Time   12/09/10  9:52 AM      Component Value Range   FIO2 .28     Delivery systems TRACH COLLAR/TRACH TUBE     pH, Arterial 7.507 (*) 7.350 - 7.450    pCO2 arterial 35.0  35.0 - 45.0 (mmHg)   pO2, Arterial 106.0 (*) 80.0 - 100.0 (mmHg)   Bicarbonate 27.5 (*) 20.0 - 24.0 (mEq/L)   TCO2 28.6  0 - 100 (mmol/L)   Acid-Base Excess 4.4 (*) 0.0 - 2.0 (mmol/L)   O2 Saturation 98.5     Patient temperature 98.6     Collection site RIGHT RADIAL     Drawn by [id number]     Sample type ARTERIAL DRAW     Allens test (pass/fail) PASS  PASS   BASIC METABOLIC PANEL     Status: Abnormal   Collection Time   12/10/10  4:00 AM      Component Value Range   Sodium 146 (*) 135 - 145 (mEq/L)   Potassium 3.4 (*) 3.5 - 5.1 (mEq/L)   Chloride 107  96 - 112 (mEq/L)   CO2 31  19 - 32 (mEq/L)   Glucose, Bld 172 (*) 70 - 99 (mg/dL)   BUN 23  6 - 23 (mg/dL)   Creatinine, Ser 0.45  0.50 - 1.35 (mg/dL)   Calcium 9.6  8.4 - 40.9 (mg/dL)   GFR calc non Af Amer >90  >90 (mL/min)   GFR calc Af Amer >90  >90 (mL/min)    Physical Exam  Drowsy. Neck is supple without bruit. Distal pulses are felt. Cardiac exam atrial fibrillation rapid heart rate. Lungs clear to auscultation.  Neurological exam drowsy but arouses easily today and Follows  commands well..Eye movements are full range with saccadic dysmetria horizontally. Blinks to threat bilaterally. Minimal left lower facial weakness. Moves all 4 extremities against gravity but there is mild left-sided weakness. He is unable to hold extremities up against gravity. There is drift in all 4 extremities. Both plantars are downgoing.   ASSESSMENT [name] with a right hemispheric right brain cardioembolic infarcts secondary to atrial fibrillation, off  warfarin for secondary stroke prevention due to hemorrhage occurring earlier in hospital stay. BP has been elevated, requiring PRN medications.  Stroke risk factors:  atrial fibrillation, diabetes mellitus, hyperlipidemia and CAD  Hospital day # 21  TREATMENT/PLAN   Resume home BP medications. D/C prn hydralazine. Agree with plans for transfer. Check swallow evaluation once stable from respiratory standpoint. SW involved for placement. Mobilize OOB.Discussed with brother at the bedside and answered questions.  Joaquin Music, ANP-BC, GNP-BC [facility] Pager: [phone] 12/10/2010 9:26 AM  Dr. Delia Heady, Stroke Center Medical Director, has personally reviewed chart, pertinent data, examined the patient and developed the plan of care.

## 2010-12-10 NOTE — Progress Notes (Signed)
Discussed in the long length of stay meeting [name] 12/10/2010  

## 2010-12-10 NOTE — Progress Notes (Signed)
[name] is a 55 y.o. male former smoker admitted on 11/19/2010 with acute onset of slurred speech and Lt sided weakness. Dx Cerebellar ischemic cva then converted to bleed. Transferred to SDU 11/24 but returned to ICU 11/25 with decreased LOC which resolved with reinitiation of mechanical ventilation.   PMHx DM, CAD, CHF, HTN, Gout, A fib.  Line/tubes: ETT 11/7>>11/9, 11/10 (cerebellar hge) >>11/16 Ventric 11/10 (dr Sherlon Handing emergent) >>>11/20 R [place] TLC 11/7 >>11/17 Trach (ENT) 11/16>>> R PICC line 11/17 >> 11/21 PEG (Brodie)  Cx: Blood 11/8>>Neg Sputum 11/8>>Moderate Staph aureus. Blood 11/12>>> neg x2 Urine 11/12>>>NTD Sputum 11/12: abundant GPC pairs/abundant WBC>>>NF 11/19 BC x 2 (fever104) >>> Csf 11/19>>>gram stain neg>>>neg Sputum 11/19 > neg C diff 11/21 > neg Sputum 11/24 >>> mod beta hemolytic strep BC x2 11/24 >>> ngtd 11/28 >> UC 11/24 >>> NEG   Abx: Rocephin 11/8>>11/12 Zithromax 11/8>>11/12 Zosyn 11/12>>>11/19 Vancomycin 11/12>>>11/16 vanc ( fever 104, ivc) 11/19>>>11/21 ceftaz 11/19 (CSF ) 11/19>>>11/21  Best practice: Pepcid DVT  PAS and LMWH  Consults: Neuro Cardiology [name]) [name]>>>11/20  Tests/events: 11/7: CT head>>negative 11/7: CT chest>>b/l GGO more at bases, small b/l effusions, no PE, dependent ATX 11/8: MRI head w/o contrast>>Acute/subacute non-hemorrhagic infarct Rt superior cerebellar artery territory, remote Lt cerebellar infarct, multiple punctated areas of remote hemorrhage compatible with amyloid angiopathy 11/8: Echo>>severe LVH, EF 25 to 35%, mild AR, mild MR, PAS 11/10 - CT head am: marked hgic transformation of rt cerebaellar infarct -> supratentorial hydrocephalus -> ventric drain 11/10 - GCS 5 then reintubated. 11/18 CT head>>>Right cerebellar infarct with improving hemorrhage. Associated cerebellar edema with effacement of the 4th ventricle and upward transtentorial herniation, unchanged. Right frontal  approach ventriculostomy catheter with ventriculomegaly, stable versus mildly improved. Layering hemorrhage in the posterior horns of the lateral ventricles, mildly increased. 11/19 CT head>>>Right cerebellar intraparenchymal hemorrhage measures slightly larger at 2 cm. Increased surrounding edema. Persistent upward transtentorial herniation, effacement of the fourth ventricle, and mild downward tonsillar herniation. Right transfrontal approach intraventricular catheter. There is persistent prominence of the lateral ventricles and third ventricle with layering hemorrhage within the occipital horns of the lateral ventricles. 11/19 - high fevers 11/20- improved fever and neuro status 11/23 - off vent X 72 hrs tx to sdu 11/24 AMS >> re-ct Head: Large right hemorrhagic right cerebellar infarct with local mass effect. Compression of the fourth ventricle. Upward and downward herniation. The amount of blood associated with this infarct has progressed slightly since prior exam. Amount of intraventricular blood has progressed slightly.  Right frontal shunt catheter has been removed with blood seen along the tract of the removed catheter.Ventricular enlargement similar to minimally more prominent.Question new right parietal lobe infarct. (NS and Stroke MDs consider minimal change) 11/25 decreased loc , transfer to 3100, repeat ct head NSC.    SUBJECTIVE:  Lethargic on TC. No overt distress. Was able to F/C earlier but presently RASS -2 and not F/C  OBJECTIVE:  Temp:  [98.5 F (36.9 C)-100 F (37.8 C)] 99 F (37.2 C) (11/28 0800) Pulse Rate:  [39-105] 130  (AFib) Resp:  [15-41] 17  (11/28 1000) BP: (110-184)/(21-132) 146/114 mmHg (11/28 1000) SpO2:  [98 %-100 %] 100 % (11/28 1000) FiO2 (%):  [28 %] 28 % (11/28 0734) Weight:  [96.9 kg (213 lb 10 oz)] 213 lb 10 oz (96.9 kg) (11/28 0500)   Intake/Output Summary (Last 24 hours) at 12/10/10 1016 Last data filed at 12/10/10 1000  Gross per 24 hour  Intake   2225 ml  Output   2840 ml  Net   -615 ml   General - NAD  ENT: - Pupils reactive, OMI Cardiac RRR, Nl S1/S2, -M Chest - Clear anteriorly Abd - soft, non-tender, ND and +BS, PEG site clean Ext - no edema     Lab 12/10/10 0400 12/09/10 0400 12/08/10 0515  NA 146* 145 147*  K 3.4* 3.7 3.4*  CL 107 108 110  CO2 31 28 28   BUN 23 27* 33*  CREATININE 0.95 1.10 1.06  GLUCOSE 172* 240* 207*    Lab 12/08/10 0515 12/07/10 0430 12/05/10 0415  HGB 10.0* 10.8* 8.6*  HCT 33.2* 33.5* 26.9*  WBC 7.9 9.5 7.5  PLT 356 370 299    ASSESSMENT/PLAN:  1) Cerebellar Ischemic infarct  with hemorrhage transformation/Coma  Cognition has returned to previous baseline. No intervention planned by Stroke or NS  2) Acute resp failure: Cont TC as tolerated. Transfer to 2600 when bed available   3) A. Fib: Heparin stopped following cerebellar hemorrhage 11/21/10 now on lovenox @ DVT prophylaxis doses. OK to continue per Stroke Service. Rate and Htn remain poorly controlled. Carvedilol resumed. Cont diltiazem. Cont PRN metoprolol  4) Hypertension: Cont Diltiazem. Home antihypertensives resumed  5) h/o CAD No evidence of acute ischemia  6) H/O Gout Inactive  7) Fever -resolved.    8) Acute renal failure, resolved  Lab Results  Component Value Date   CREATININE 0.95 12/10/2010   CREATININE 1.10 12/09/2010   CREATININE 1.06 12/08/2010     10) Hypernatremia. Mild. Given recent CVA/edema, will not correct  Lab Results  Component Value Date   NA 146* 12/10/2010   NA 145 12/09/2010   NA 147* 12/08/2010    11) Hyperglycemia Metformin resumed today Cont Lantus and SSI  Awaiting transfer to 2600   30 minutes CCM time  Billy Fischer, MD;  PCCM service; [place] [phone]

## 2010-12-10 NOTE — Progress Notes (Signed)
eLink Physician-Brief Progress Note Patient Name: [name] DOB: [birth year] MRN: [id number]  Date of Service  12/10/2010   HPI/Events of Note   RT reports wound at trach flange with bleeding & bloody sputum. Trach placed by Dr Pollyann Kennedy 11/16.  eICU Interventions  Wound care & ent consult in am   Intervention Category Minor Interventions: Routine modifications to care plan (e.g. PRN medications for pain, fever)  Francess Mullen V. 12/10/2010, 9:42 PM

## 2010-12-10 NOTE — Progress Notes (Signed)
Discussed pt in Length of Stay meeting this morning.  Pt is faxed out to skilled nursing facilities in [place]. CSW awaiting bed offers and information on insurance benefits.  CSW will continue to follow.   Baxter Flattery, MSW [phone]

## 2010-12-11 DIAGNOSIS — I4891 Unspecified atrial fibrillation: Secondary | ICD-10-CM

## 2010-12-11 DIAGNOSIS — J9600 Acute respiratory failure, unspecified whether with hypoxia or hypercapnia: Secondary | ICD-10-CM

## 2010-12-11 DIAGNOSIS — I629 Nontraumatic intracranial hemorrhage, unspecified: Secondary | ICD-10-CM

## 2010-12-11 DIAGNOSIS — R4020 Unspecified coma: Secondary | ICD-10-CM

## 2010-12-11 LAB — CBC
MCH: 29.3 pg (ref 26.0–34.0)
MCHC: 32.3 g/dL (ref 30.0–36.0)
MCV: 90.9 fL (ref 78.0–100.0)
Platelets: 413 10*3/uL — ABNORMAL HIGH (ref 150–400)
RDW: 14.3 % (ref 11.5–15.5)

## 2010-12-11 LAB — GLUCOSE, CAPILLARY

## 2010-12-11 MED ORDER — POTASSIUM CHLORIDE 20 MEQ/15ML (10%) PO LIQD
40.0000 meq | Freq: Once | ORAL | Status: DC
  Filled 2010-12-11: qty 30

## 2010-12-11 MED ORDER — POTASSIUM CHLORIDE 20 MEQ/15ML (10%) PO LIQD
ORAL | Status: AC
  Administered 2010-12-11: 40 meq
  Filled 2010-12-11: qty 30

## 2010-12-11 NOTE — Progress Notes (Signed)
Subjective: Having skin breakdown inferiorly under the shield of the tracheostomy.  Objective: Vital signs in last 24 hours: Temp:  [97.8 F (36.6 C)-99.2 F (37.3 C)] 98.8 F (37.1 C) (11/29 0759) Pulse Rate:  [39-114] 95  (11/29 0720) Resp:  [14-26] 23  (11/29 0720) BP: (109-172)/(77-124) 164/92 mmHg (11/29 0720) SpO2:  [99 %-100 %] 100 % (11/29 0720) FiO2 (%):  [28 %] 28 % (11/29 0720) Weight:  [94.3 kg (207 lb 14.3 oz)] 207 lb 14.3 oz (94.3 kg) (11/29 0400) Wt Readings from Last 1 Encounters:  12/11/10 94.3 kg (207 lb 14.3 oz)    Intake/Output from previous day: 11/28 0701 - 11/29 0700 In: 2310 [I.V.:460; NG/GT:470] Out: 1220 [Urine:1220] Intake/Output this shift:    Physical exam: Tracheostomy in place. It is a #8 cuffed trach tube. He is off the ventilator. There is skin breakdown under the inferior aspect of the shield where it is taking into the skin. Secretions were otherwise without any blood. Airway is secure.  Basename 12/11/10 0430  WBC 8.9  HGB 11.0*  HCT 34.1*  PLT 413*     Basename 12/10/10 0400 12/09/10 0400  NA 146* 145  K 3.4* 3.7  CL 107 108  CO2 31 28  GLUCOSE 172* 240*  BUN 23 27*  CREATININE 0.95 1.10  CALCIUM 9.6 9.7    Medications: I have reviewed the patient's current medications.  Assessment/Plan: Recommend we change the tracheostomy to a cuff less #6. Recommend a Tegaderm under the shield to cushion the skin. Call if any further issues arise.   LOS: 22 days   Fredia Chittenden H 12/11/2010, 8:52 AM

## 2010-12-11 NOTE — Progress Notes (Signed)
eLink Physician-Brief Progress Note Patient Name: [name] DOB: September 28, 1955 MRN: [id number]  Date of Service  12/11/2010   HPI/Events of Note   Report by nurse of patient having a dark tarry stool - not checked for blood.  Is HD stable.  Last CBC a few days back Hgb of 10.  Is on Lovenox.  eICU Interventions  Check CBC   Intervention Category Major Interventions: Respiratory failure - evaluation and management;Change in mental status - evaluation and management Intermediate Interventions: Bleeding - evaluation and treatment with blood products Minor Interventions: Routine modifications to care plan (e.g. PRN medications for pain, fever)  Meng Winterton 12/11/2010, 4:14 AM

## 2010-12-11 NOTE — Progress Notes (Signed)
Stroke Team Progress Note  SUBJECTIVE [name] is a 55 y.o. male who has been on trach collar since for 2 days. Transfer put in for 2600 yesterday. No new developments per brother at bedside   OBJECTIVE Most recent Vital Signs: Temp: 98.2 F (36.8 C) (11/29 1200) Temp src: Oral (11/29 1200) BP: 125/74 mmHg (11/29 1200) Pulse Rate: 82  (11/29 1200) Respiratory Rate: 20 O2 Saturdation: 100%  CBG (last 3)   Basename 12/11/10 1203 12/11/10 0749 12/11/10 0405  GLUCAP 121* 149* 97   Intake/Output from previous day: 11/28 0701 - 11/29 0700 In: 2390 [I.V.:480; NG/GT:470] Out: 1220 [Urine:1220]  IV Fluid Intake     . sodium chloride 20 mL/hr at 12/11/10 0600  . feeding supplement (JEVITY 1.2) 1,000 mL (12/11/10 1146)   Diet    panda tube feeds at 22ml/hr  Activity  Up with assistance  DVT Prophylaxis  Lovenox 40 mg sq daily   Studies CBC     Status: Abnormal   Collection Time   12/11/10  4:30 AM      Component Value Range   WBC 8.9  4.0 - 10.5 (K/uL)   RBC 3.75 (*) 4.22 - 5.81 (MIL/uL)   Hemoglobin 11.0 (*) 13.0 - 17.0 (g/dL)   HCT 29.5 (*) 62.1 - 52.0 (%)   MCV 90.9  78.0 - 100.0 (fL)   MCH 29.3  26.0 - 34.0 (pg)   MCHC 32.3  30.0 - 36.0 (g/dL)   RDW 30.8  65.7 - 84.6 (%)   Platelets 413 (*) 150 - 400 (K/uL)    Physical Exam  Drowsy. Neck is supple without bruit. Distal pulses are felt. Cardiac exam atrial fibrillation rapid heart rate. Lungs clear to auscultation.  Neurological exam drowsy but arouses easily today and Follows commands well..Eye movements are full range with saccadic dysmetria horizontally. Blinks to threat bilaterally. Minimal left lower facial weakness. Moves all 4 extremities against gravity but there is mild left-sided weakness. He is unable to hold extremities up against gravity. There is drift in all 4 extremities. Both plantars are downgoing.   ASSESSMENT [name] is a 55 y.o. male with a right hemispheric right brain  cardioembolic infarcts secondary to atrial fibrillation, off warfarin for secondary stroke prevention due to hemorrhage occurring earlier in hospital stay. BP remains elevated, but better, after resuming home BP medications.   Stroke risk factors:  atrial fibrillation, diabetes mellitus, hyperlipidemia and CAD  Hospital day # 22  TREATMENT/PLAN Await transfer. Resume PT and OT. Monitor BP.  Joaquin Music, ANP-BC, GNP-BC [facility] Pager: [phone] 12/11/2010 2:29 PM  Dr. Delia Heady, Stroke Center Medical Director, has personally reviewed chart, pertinent data, examined the patient and developed the plan of care.

## 2010-12-11 NOTE — Progress Notes (Signed)
[name] is a 55 y.o. male former smoker admitted on 11/19/2010 with acute onset of slurred speech and Lt sided weakness. Dx Cerebellar ischemic cva then converted to bleed. Transferred to SDU 11/24 but returned to ICU 11/25 with decreased LOC which resolved with reinitiation of mechanical ventilation.   PMHx DM, CAD, CHF, HTN, Gout, A fib.  Line/tubes: ETT 11/7>>11/9, 11/10 (cerebellar hge) >>11/16 Ventric 11/10 (dr Sherlon Handing emergent) >>>11/20 R Gem TLC 11/7 >>11/17 Trach (ENT) 11/16>>> R PICC line 11/17 >> 11/21 PEG (Brodie)  Cx: Blood 11/8>>Neg Sputum 11/8>>Moderate Staph aureus. Blood 11/12>>> neg x2 Urine 11/12>>>NTD Sputum 11/12: abundant GPC pairs/abundant WBC>>>NF 11/19 BC x 2 (fever104) >>> Csf 11/19>>>gram stain neg>>>neg Sputum 11/19 > neg C diff 11/21 > neg Sputum 11/24 >>> mod beta hemolytic strep BC x2 11/24 >>> ngtd 11/28 >> UC 11/24 >>> NEG   Abx: Rocephin 11/8>>11/12 Zithromax 11/8>>11/12 Zosyn 11/12>>>11/19 Vancomycin 11/12>>>11/16 vanc ( fever 104, ivc) 11/19>>>11/21 ceftaz 11/19 (CSF ) 11/19>>>11/21  Best practice: Pepcid DVT  PAS and LMWH  Consults: Neuro Cardiology [name]) [name]>>>11/20  Tests/events: 11/7: CT head>>negative 11/7: CT chest>>b/l GGO more at bases, small b/l effusions, no PE, dependent ATX 11/8: MRI head w/o contrast>>Acute/subacute non-hemorrhagic infarct Rt superior cerebellar artery territory, remote Lt cerebellar infarct, multiple punctated areas of remote hemorrhage compatible with amyloid angiopathy 11/8: Echo>>severe LVH, EF 25 to 35%, mild AR, mild MR, PAS 11/10 - CT head am: marked hgic transformation of rt cerebaellar infarct -> supratentorial hydrocephalus -> ventric drain 11/10 - GCS 5 then reintubated. 11/18 CT head>>>Right cerebellar infarct with improving hemorrhage. Associated cerebellar edema with effacement of the 4th ventricle and upward transtentorial herniation, unchanged. Right frontal  approach ventriculostomy catheter with ventriculomegaly, stable versus mildly improved. Layering hemorrhage in the posterior horns of the lateral ventricles, mildly increased. 11/19 CT head>>>Right cerebellar intraparenchymal hemorrhage measures slightly larger at 2 cm. Increased surrounding edema. Persistent upward transtentorial herniation, effacement of the fourth ventricle, and mild downward tonsillar herniation. Right transfrontal approach intraventricular catheter. There is persistent prominence of the lateral ventricles and third ventricle with layering hemorrhage within the occipital horns of the lateral ventricles. 11/19 - high fevers 11/20- improved fever and neuro status 11/23 - off vent X 72 hrs tx to sdu 11/24 AMS >> re-ct Head: Large right hemorrhagic right cerebellar infarct with local mass effect. Compression of the fourth ventricle. Upward and downward herniation. The amount of blood associated with this infarct has progressed slightly since prior exam. Amount of intraventricular blood has progressed slightly.  Right frontal shunt catheter has been removed with blood seen along the tract of the removed catheter.Ventricular enlargement similar to minimally more prominent.Question new right parietal lobe infarct. (NS and Stroke MDs consider minimal change) 11/25 decreased loc , transfer to 3100, repeat ct head NSC.    SUBJECTIVE:  On trach collar throughout night successful  OBJECTIVE: Filed Vitals:   12/11/10 1200  BP: 125/74  Pulse: 82  Temp: 98.2 F (36.8 C)  Resp: 20    Temp:  [98.5 F (36.9 C)-100 F (37.8 C)] 99 F (37.2 C) (11/28 0800) Pulse Rate:  [39-105] 130  (AFib) Resp:  [15-41] 17  (11/28 1000) BP: (110-184)/(21-132) 146/114 mmHg (11/28 1000) SpO2:  [98 %-100 %] 100 % (11/28 1000) FiO2 (%):  [28 %] 28 % (11/28 0734) Weight:  [96.9 kg (213 lb 10 oz)] 213 lb 10 oz (96.9 kg) (11/28 0500)   Intake/Output Summary (Last 24 hours) at 12/11/10 1303 Last data  filed at 12/11/10 1200  Gross per 24 hour  Intake   2480 ml  Output   1025 ml  Net   1455 ml   General - NAD  ENT: - Pupils reactive, OMI Cardiac RRR, Nl S1/S2, -M Chest - Clear anteriorly, no chnages Abd - soft, non-tender, ND and +BS, PEG site clean Ext - no edema     Lab 12/10/10 0400 12/09/10 0400 12/08/10 0515  NA 146* 145 147*  K 3.4* 3.7 3.4*  CL 107 108 110  CO2 31 28 28   BUN 23 27* 33*  CREATININE 0.95 1.10 1.06  GLUCOSE 172* 240* 207*    Lab 12/11/10 0430 12/08/10 0515 12/07/10 0430  HGB 11.0* 10.0* 10.8*  HCT 34.1* 33.2* 33.5*  WBC 8.9 7.9 9.5  PLT 413* 356 370    ASSESSMENT/PLAN:  1) Cerebellar Ischemic infarct  with hemorrhage transformation/Coma  Cognition has returned to previous baseline. No intervention planned by Stroke or NS Follow Na  2) Acute resp failure: Cont TC as tolerated. Transfer to 2600 when bed available. Did well overnight   3) A. Fib: Heparin stopped following cerebellar hemorrhage 11/21/10 now on lovenox @ DVT prophylaxis doses. OK to continue per Stroke Service. Rate and Htn remain poorly controlled. Carvedilol resumed. Cont diltiazem. Cont PRN metoprolol  4) Hypertension: Cont Diltiazem. Home antihypertensives resumed  5) h/o CAD No evidence of acute ischemia  6) H/O Gout Inactive  7) Fever -resolved.    8) Acute renal failure, resolved reaplce K , recheck in am   Lab Results  Component Value Date   CREATININE 0.95 12/10/2010   CREATININE 1.10 12/09/2010   CREATININE 1.06 12/08/2010     10) Hypernatremia. Mild. Given recent CVA/edema, will not correct  Lab Results  Component Value Date   NA 146* 12/10/2010   NA 145 12/09/2010   NA 147* 12/08/2010    11) Hyperglycemia Metformin resumed today Cont Lantus and SSI controld well  Awaiting transfer to 2600  Kamyra Schroeck J. Tyson Alias, MD, FACP Pgr: [phone] [facility]

## 2010-12-12 DIAGNOSIS — K921 Melena: Secondary | ICD-10-CM

## 2010-12-12 LAB — CULTURE, BLOOD (ROUTINE X 2)
Culture  Setup Time: 201211241739
Culture: NO GROWTH

## 2010-12-12 LAB — CBC
HCT: 30.4 % — ABNORMAL LOW (ref 39.0–52.0)
MCHC: 32.2 g/dL (ref 30.0–36.0)
Platelets: 348 10*3/uL (ref 150–400)
RDW: 14.2 % (ref 11.5–15.5)
WBC: 12.3 10*3/uL — ABNORMAL HIGH (ref 4.0–10.5)

## 2010-12-12 LAB — BASIC METABOLIC PANEL
BUN: 27 mg/dL — ABNORMAL HIGH (ref 6–23)
CO2: 28 mEq/L (ref 19–32)
Calcium: 9.5 mg/dL (ref 8.4–10.5)
Creatinine, Ser: 0.94 mg/dL (ref 0.50–1.35)
Glucose, Bld: 155 mg/dL — ABNORMAL HIGH (ref 70–99)

## 2010-12-12 LAB — GLUCOSE, CAPILLARY
Glucose-Capillary: 156 mg/dL — ABNORMAL HIGH (ref 70–99)
Glucose-Capillary: 142 mg/dL — ABNORMAL HIGH (ref 70–99)
Glucose-Capillary: 121 mg/dL — ABNORMAL HIGH (ref 70–99)

## 2010-12-12 LAB — OCCULT BLOOD X 1 CARD TO LAB, STOOL: Fecal Occult Bld: POSITIVE

## 2010-12-12 MED ORDER — PANTOPRAZOLE SODIUM 40 MG IV SOLR
40.0000 mg | INTRAVENOUS | Status: DC
  Administered 2010-12-12 – 2010-12-15 (×4): 40 mg via INTRAVENOUS
  Filled 2010-12-12 (×5): qty 40

## 2010-12-12 NOTE — Plan of Care (Signed)
Problem: Phase II Progression Outcomes Goal: Discharge plan established Outcome: Progressing OT recommending SNF at D/C and will follow acutely. Thanks-  Cassandria Anger, OTR/L Pager: [phone] 12/12/2010 .

## 2010-12-12 NOTE — Progress Notes (Signed)
[name] is a 55 y.o. male former smoker admitted on 11/19/2010 with acute onset of slurred speech and Lt sided weakness. Dx Cerebellar ischemic cva then converted to bleed. Transferred to SDU 11/24 but returned to ICU 11/25 with decreased LOC which resolved with reinitiation of mechanical ventilation.   PMHx DM, CAD, CHF, HTN, Gout, A fib.  Line/tubes: ETT 11/7>>11/9, 11/10 (cerebellar hge) >>11/16 Ventric 11/10 (dr Sherlon Handing emergent) >>>11/20 R  TLC 11/7 >>11/17 Trach (ENT) 11/16>>> R PICC line 11/17 >> 11/21 PEG (Brodie)  Cx: Blood 11/8>>Neg Sputum 11/8>>Moderate Staph aureus. Blood 11/12>>> neg x2 Urine 11/12>>>NTD Sputum 11/12: abundant GPC pairs/abundant WBC>>>NF 11/19 BC x 2 (fever104) >>> Csf 11/19>>>gram stain neg>>>neg Sputum 11/19 > neg C diff 11/21 > neg Sputum 11/24 >>> mod beta hemolytic strep BC x2 11/24 >>> ngtd 11/28 >> UC 11/24 >>> NEG   Abx: Rocephin 11/8>>11/12 Zithromax 11/8>>11/12 Zosyn 11/12>>>11/19 Vancomycin 11/12>>>11/16 vanc ( fever 104, ivc) 11/19>>>11/21 ceftaz 11/19 (CSF ) 11/19>>>11/21  Best practice: Pepcid DVT  PAS and LMWH  Consults: Neuro Cardiology [name]) [name]>>>11/20  Tests/events: 11/7: CT head>>negative 11/7: CT chest>>b/l GGO more at bases, small b/l effusions, no PE, dependent ATX 11/8: MRI head w/o contrast>>Acute/subacute non-hemorrhagic infarct Rt superior cerebellar artery territory, remote Lt cerebellar infarct, multiple punctated areas of remote hemorrhage compatible with amyloid angiopathy 11/8: Echo>>severe LVH, EF 25 to 35%, mild AR, mild MR, PAS 11/10 - CT head am: marked hgic transformation of rt cerebaellar infarct -> supratentorial hydrocephalus -> ventric drain 11/10 - GCS 5 then reintubated. 11/18 CT head>>>Right cerebellar infarct with improving hemorrhage. Associated cerebellar edema with effacement of the 4th ventricle and upward transtentorial herniation, unchanged. Right frontal  approach ventriculostomy catheter with ventriculomegaly, stable versus mildly improved. Layering hemorrhage in the posterior horns of the lateral ventricles, mildly increased. 11/19 CT head>>>Right cerebellar intraparenchymal hemorrhage measures slightly larger at 2 cm. Increased surrounding edema. Persistent upward transtentorial herniation, effacement of the fourth ventricle, and mild downward tonsillar herniation. Right transfrontal approach intraventricular catheter. There is persistent prominence of the lateral ventricles and third ventricle with layering hemorrhage within the occipital horns of the lateral ventricles. 11/19 - high fevers 11/20- improved fever and neuro status 11/23 - off vent X 72 hrs tx to sdu 11/24 AMS >> re-ct Head: Large right hemorrhagic right cerebellar infarct with local mass effect. Compression of the fourth ventricle. Upward and downward herniation. The amount of blood associated with this infarct has progressed slightly since prior exam. Amount of intraventricular blood has progressed slightly.  Right frontal shunt catheter has been removed with blood seen along the tract of the removed catheter.Ventricular enlargement similar to minimally more prominent.Question new right parietal lobe infarct. (NS and Stroke MDs consider minimal change) 11/25 decreased loc , transfer to 3100, repeat ct head NSC.    SUBJECTIVE:  On trach collar throughout night successful  OBJECTIVE: Filed Vitals:   12/12/10 1154  BP: 120/86  Pulse: 107  Temp: 100.9 F (38.3 C)  Resp: 18    Temp:  [98.5 F (36.9 C)-100 F (37.8 C)] 99 F (37.2 C) (11/28 0800) Pulse Rate:  [39-105] 130  (AFib) Resp:  [15-41] 17  (11/28 1000) BP: (110-184)/(21-132) 146/114 mmHg (11/28 1000) SpO2:  [98 %-100 %] 100 % (11/28 1000) FiO2 (%):  [28 %] 28 % (11/28 0734) Weight:  [96.9 kg (213 lb 10 oz)] 213 lb 10 oz (96.9 kg) (11/28 0500)   Intake/Output Summary (Last 24 hours) at 12/12/10 1222 Last data  filed at 12/12/10 0700  Gross per 24 hour  Intake   1700 ml  Output    640 ml  Net   1060 ml   General - NAD  ENT: - Pupils reactive, OMI Cardiac RRR, Nl S1/S2, -M Chest - Clear anteriorly, no chnages Abd - soft, non-tender, ND and +BS, PEG site clean Ext - no edema     Lab 12/12/10 0443 12/10/10 0400 12/09/10 0400  NA 146* 146* 145  K 3.5 3.4* 3.7  CL 108 107 108  CO2 28 31 28   BUN 27* 23 27*  CREATININE 0.94 0.95 1.10  GLUCOSE 155* 172* 240*    Lab 12/11/10 0430 12/08/10 0515 12/07/10 0430  HGB 11.0* 10.0* 10.8*  HCT 34.1* 33.2* 33.5*  WBC 8.9 7.9 9.5  PLT 413* 356 370    ASSESSMENT/PLAN:  1) Cerebellar Ischemic infarct  with hemorrhage transformation/Coma  Cognition has returned to previous baseline. No intervention planned by Stroke or NS Follow Na  2) Acute resp failure: Cont TC as tolerated. Transfer to 2600 when bed available. Did well overnight   3) A. Fib: Heparin stopped following cerebellar hemorrhage 11/21/10 now on lovenox @ DVT prophylaxis doses. OK to continue per Stroke Service. Rate and Htn remain poorly controlled. Carvedilol resumed. Cont diltiazem. Cont PRN metoprolol  4) Hypertension: Cont Diltiazem. Home antihypertensives resumed  5) h/o CAD No evidence of acute ischemia  6) H/O Gout Inactive  7) Fever -resolved.    8) Acute renal failure, resolved reaplce K , recheck in am   Lab Results  Component Value Date   CREATININE 0.94 12/12/2010   CREATININE 0.95 12/10/2010   CREATININE 1.10 12/09/2010     10) Hypernatremia. Mild. Given recent CVA/edema, will not correct specially that neuro had patient on 3% saline recently, will allow time to equilibrate. Lab Results  Component Value Date   NA 146* 12/12/2010   NA 146* 12/10/2010   NA 145 12/09/2010    11) Hyperglycemia Metformin resumed today Cont Lantus and SSI  Braelin Costlow, M.D. [phone]

## 2010-12-12 NOTE — Progress Notes (Signed)
Occupational Therapy Evaluation Patient Details Name: [name] MRN: [id number] DOB: [birth year] Today's Date: 12/12/2010  Problem List:  Patient Active Problem List  Diagnoses  . Stroke  . Diabetes mellitus  . Respiratory failure  . A-fib  . Gout  . CAD (coronary artery disease)  . Hyperlipemia  . CHF (congestive heart failure)  . Hematochezia    Past Medical History:  Past Medical History  Diagnosis Date  . Diabetes mellitus   . Coronary artery disease   . Hypertension   . Gout   . CHF (congestive heart failure)   . Afib   . Arthritis    Past Surgical History:  Past Surgical History  Procedure Date  . Tracheostomy tube placement 11/28/2010    Procedure: TRACHEOSTOMY;  Surgeon: Susy Frizzle, MD;  Location: [facility] OR;  Service: ENT;  Laterality: N/A;  . Peg placement 12/03/2010    Procedure: PERCUTANEOUS ENDOSCOPIC GASTROSTOMY (PEG) PLACEMENT;  Surgeon: Hart Carwin, MD;  Location: [facility] ENDOSCOPY;  Service: Endoscopy;  Laterality: N/A;    OT Assessment/Plan/Recommendation OT Assessment Clinical Impression Statement: Pt. will benefit from OT in acute care setting to increase functional independence with ADLs and decrease burden of care at next venue of care OT Recommendation/Assessment: Patient will need skilled OT in the acute care venue OT Problem List: Decreased strength;Decreased range of motion;Decreased activity tolerance;Impaired balance (sitting and/or standing);Impaired vision/perception;Decreased coordination;Decreased cognition;Decreased safety awareness;Decreased knowledge of use of DME or AE;Decreased knowledge of precautions;Cardiopulmonary status limiting activity;Impaired sensation;Impaired tone;Impaired UE functional use Barriers to Discharge: Decreased caregiver support OT Therapy Diagnosis : Generalized weakness;Cognitive deficits;Disturbance of vision;Acute pain;Hemiplegia dominant side;Hemiplegia non-dominant side OT Plan OT Frequency: Min  2X/week OT Treatment/Interventions: Self-care/ADL training;Neuromuscular education;Energy conservation;Therapeutic activities;Cognitive remediation/compensation;Visual/perceptual remediation/compensation;Patient/family education;Balance training;DME and/or AE instruction OT Recommendation Follow Up Recommendations: Skilled nursing facility Equipment Recommended: Defer to next venue Individuals Consulted Consulted and Agree with Results and Recommendations: Patient unable/family or caregiver not available OT Goals Acute Rehab OT Goals OT Goal Formulation: Patient unable to participate in goal setting Time For Goal Achievement: 2 weeks ADL Goals Pt Will Perform Grooming: with mod assist;Supported;with cueing (comment type and amount);Sitting, chair (Mod tactile and verbal cuing ) ADL Goal: Grooming - Progress: Progressing toward goals Additional ADL Goal #1: Pt. will roll right and left with total assist +2 pt=30% to increase participation with ADLs. Additional ADL Goal #2: Pt. will sit EOB ~78mins with mod assist trunk support in preparation for ADLs  OT Evaluation Precautions/Restrictions  Precautions Precautions: Fall Required Braces or Orthoses: No Restrictions Weight Bearing Restrictions: No Prior Functioning Home Living Lives With: Alone Type of Home: House Home Layout: One level Home Access: Stairs to enter Entrance Stairs-Rails: Lawyer of Steps: 3-4 Bathroom Toilet: Standard Home Adaptive Equipment: Crutches Prior Function Level of Independence: Independent with basic ADLs;Independent with homemaking with ambulation;Independent with gait Driving: Yes ADL ADL Eating/Feeding: NPO Grooming: Performed;Wash/dry face;+1 Total assistance Grooming Details (indicate cue type and reason): Pt. able to maintain gross grasp of wash cloth, however due to proximal weakness pt. unable to bring washcloth to face without hand over hand facilitation with right  hand Where Assessed - Grooming: Supine, head of bed up Upper Body Bathing: Simulated;Chest;Right arm;Left arm;Abdomen;+1 Total assistance Where Assessed - Upper Body Bathing: Supine, head of bed up Lower Body Bathing: Simulated;+1 Total assistance Where Assessed - Lower Body Bathing: Supine, head of bed up Upper Body Dressing: Simulated;+1 Total assistance Where Assessed - Upper Body Dressing: Supine, head of bed up Lower Body  Dressing: +1 Total assistance;Performed Lower Body Dressing Details (indicate cue type and reason): Pt. unable to don/doff socks due to decreased functional use of bilateral UE and trunk weakness Where Assessed - Lower Body Dressing: Sit to stand from bed Toilet Transfer: Not assessed Toilet Transfer Method: Not assessed Toileting - Clothing Manipulation: Not assessed Toileting - Hygiene: Not assessed Where Assessed - Toileting Hygiene: Not assessed Tub/Shower Transfer: Not assessed ADL Comments: Pt. with decreased arousal today and provided with max tactile and verbal cues with sternal rub incolded to increase arousal and maintain eyes open. pt. more aroused when sitting up EOB while completing dynamic sitting balance activities prior to completing stand-pivot to chair. Pt. max assist for sitting balance with anterior, posterior, and lateral weightshifting to increase trunk strength. Pt. left repositioned in the chair with pillows on bilateral aspects of neck to facilitate upright position of head in midline due to neck rotation and preference to left. Pt. with bilateral shoulder subluxation of shoulders and provided with pillows to reposition bilateral UE to promote joint integrity and encourage functional use of upper extremities.  Vision/Perception  Vision - History Baseline Vision: No visual deficits Patient Visual Report: Blurring of vision Vision - Assessment Eye Alignment: Impaired (comment) Vision Assessment: Vision tested Alignment/Gaze Preference: Gaze  left;Chin down;Head turned;Head tilt Tracking/Visual Pursuits: Right eye does not track laterally;Decreased smoothness of vertical tracking;Decreased smoothness of horizontal tracking;Left eye does not track medially Saccades: Impaired - to be further tested in functional context Convergence: Impaired - to be further tested in functional context Additional Comments: Pt. blinks to threat bilaterally. Nystagmus noted in left eye with horiszontal gaze at end range Perception Perception: Impaired Inattention/Neglect: Does not attend to right visual field Praxis Praxis: Impaired Praxis Impairment Details: Initiation Cognition Cognition Arousal/Alertness: Lethargic Overall Cognitive Status: Difficult to assess (Due to pt. with decreased verbalization and arousal) Difficult to assess due to: impaired communication;tracheostomy Orientation Level: Oriented to person Sensation/Coordination Sensation Light Touch: Impaired by gross assessment Stereognosis: Impaired by gross assessment Hot/Cold: Impaired by gross assessment Proprioception: Impaired by gross assessment Additional Comments: Difficult to formally assess due to pt. not following commands consistently Coordination Gross Motor Movements are Fluid and Coordinated: No Fine Motor Movements are Fluid and Coordinated: No Finger Nose Finger Test: Pt. unable to complete Heel Shin Test: Pt. unable Extremity Assessment RUE Assessment RUE Assessment: Exceptions to [facility] RUE PROM (degrees) Overall PROM Right Upper Extremity: Within functional limits for tasks performed RUE Strength RUE Overall Strength:  (Shoulder/elbow 2/5; hand 3/5) RUE Tone RUE Tone: Hypotonic Hypotonic Details: Pt. with proximal weakness >distal  LUE Assessment LUE Assessment: Exceptions to WFL LUE PROM (degrees) Overall PROM Left Upper Extremity: Within functional limits for tasks assessed LUE Strength LUE Overall Strength: Deficits;Other (Comment) (Shoulder/elbow  2/5; hand 3/5) LUE Tone LUE Tone: Hypotonic Mobility  Bed Mobility Bed Mobility: Yes Rolling Right: 1: +2 Total assist Rolling Right Details (indicate cue type and reason): Max tactile cues to facilitate  Rolling Left: Not tested (comment) Right Sidelying to Sit: 1: +2 Total assist;Patient percentage (comment) (pt=0%) Sitting - Scoot to Edge of Bed: 1: +2 Total assist Transfers Transfers: Yes Sit to Stand: 1: +2 Total assist;Patient percentage (comment) (pt<10%) Sit to Stand Details (indicate cue type and reason): Max manual facilitation at hips and trunk to facilitate upright position and bilateral knees blocked due to buckling    End of Session OT - End of Session Activity Tolerance: Patient limited by fatigue Patient left: in chair;with call bell in reach Nurse  Communication: Mobility status for transfers;Need for lift equipment (Technique for lift equipment) General Behavior During Session: Lethargic Cognition: Impaired Cognitive Impairment: Pt. with follows 1-step commands <25% of time.    Iszabella Hebenstreit, OTR/L Pager [phone] 12/12/2010, 3:00 PM

## 2010-12-12 NOTE — Progress Notes (Signed)
CSW awaiting response from SNF as to bed availability next week and insurance approval. [facility] to evaluate pt as well. CSW will continue to follow.  Baxter Flattery, MSW [phone]

## 2010-12-12 NOTE — Progress Notes (Signed)
     [facility] Daily Rounding Note 12/12/2010, 2:13 PM  SUBJECTIVE: Called back to see pt.  He has maroon stool last PM and again this afternoon.  Stool is large volume, maroon and  Smells of melena.  Dry blood at Peg site.  Rn has checked residuals every 4 hours and has seen no fresh blood or coffee like material.  PEG placed on 11/21 by Dr. Juanda Chance, no mention in report of upper GI pathology.  On Pepcid suspension via tube.  Last Hgb 11.0 yesterday. BUN 27 today. coags normal on 11/10/21012.  Tolerating tube feeds. OBJECTIVE: Cushingoid looking.  Comfortable.  Vital signs in last 24 hours: Temp:  [98.8 F (37.1 C)-101 F (38.3 C)] 100.9 F (38.3 C) (11/30 1154) Pulse Rate:  [59-118] 106  (11/30 1228) Resp:  [12-28] 23  (11/30 1228) BP: (93-196)/(68-137) 119/85 mmHg (11/30 1228) SpO2:  [96 %-100 %] 99 % (11/30 1228) FiO2 (%):  [28 %] 28 % (11/30 1228) Weight:  [97.4 kg (214 lb 11.7 oz)] 214 lb 11.7 oz (97.4 kg) (11/30 0300) Last BM Date: 12/12/10  Heart: RRR Chest: Clear Abdomen: Peg site  Neuro/Psych:  Mininimally responsive.  Not agitated  MEDS:  antiseptic oral rinse 15 mL, Mouth Rinse, QID Given: 11/30 1250 carvedilol 25 mg, PO, BID WC Given: 11/30 0923 chlorhexidine 15 mL, Mouth Rinse, BID Given: 11/30 0925 diltiazem (CARDIZEM) oral suspension 10 mg/ml 60 mg, PER TUBE, Q6H Given: 11/30 1250 famotidine (PEPCID) oral suspension 40 mg/5 mL 20 mg, PER TUBE, Every 12 hours (non-specified) Given: 11/30 0924 insulin aspart 3 Units, [place], Q4H Given: 11/30 1251 insulin aspart (novoLOG) injection 4 Units, [place], Q4H Given: 11/30 1252 insulin glargine 20 Units, [place], BID Given: 11/30 0925 isosorbide-hydrALAZINE 1 tablet, PO, TID Given: 11/30 0923 potassium chloride liquid 20 mEq/15 mL 40 mEq, PER TUBE, Once Ordered ramipril (ALTACE) capsule 10 mg, PO, Daily Given: 11/30 0923 rosuvastatin (CRESTOR) tablet 5 mg, PO, q1800 Given: 11/29 1750 sodium chloride 10 mL, IK, Q12H Given: 11/30  0925     Lab Results:  [facility] 12/11/10 0430  WBC 8.9  HGB 11.0*  HCT 34.1*  PLT 413*   BMET  Basename 12/12/10 0443 12/10/10 0400  NA 146* 146*  K 3.5 3.4*  CL 108 107  CO2 28 31  GLUCOSE 155* 172*  BUN 27* 23  CREATININE 0.94 0.95  CALCIUM 9.5 9.6   ASSESMENT: 1.  GI Bleed, source undetermined.  ? PEG associated, ? Diverticular  ? Ulcer   2.  CVA s/p craniotomy 3.  IDDM    PLAN:    Serial cbc, now and in AM.     D/c pepcid and start Protonix IV (empiric in case this is ulcer)   LOS: 23 days   Jennye Moccasin  12/12/2010, 2:13 PM Pager: [phone]   GI ATTENDING CALLED BACK TO SEE PT RE GI BLEEDING. PEG LAST WEEK. NORMAL EGD. SOME BLOOD AROUND INSERTION SITE. NEGATIVE LAVAGE. HG DECREASE 1 GM. VSS. MY RECTAL EXAM WITH DARK BROWN STOOL WITH FLECKS OF DARK AND LIGHT REDISH BLOOD. LOVENOX STOPPED DO NOT SUSPECT SIGNIFICANT BLEED AT THIS TIME. MAY HAVE SOME DARK FROM PEG RELATED TRAUMA. POSSIBLE LGI CONTRIBUTIONS AS WELL. REALLY NOT AN APPROPRIATE CANDIDATE FOR ENDOSCOPIC EVALUATIONS AT THIS TIME (UNLESS SEVERE BLEEDING DEVELOPS). DISCUSSED WITH BROTHER AND HIS WIFE. WILL FOLLOW .Marland Kitchen THANKS ... JNP

## 2010-12-12 NOTE — Progress Notes (Signed)
Physical Therapy Evaluation Patient Details Name: [name] MRN: [id number] DOB: [birth year] Today's Date: 12/12/2010  Problem List:  Patient Active Problem List  Diagnoses  . Stroke  . Diabetes mellitus  . Respiratory failure  . A-fib  . Gout  . CAD (coronary artery disease)  . Hyperlipemia  . CHF (congestive heart failure)  . Hematochezia    Past Medical History:  Past Medical History  Diagnosis Date  . Diabetes mellitus   . Coronary artery disease   . Hypertension   . Gout   . CHF (congestive heart failure)   . Afib   . Arthritis    Past Surgical History:  Past Surgical History  Procedure Date  . Tracheostomy tube placement 11/28/2010    Procedure: TRACHEOSTOMY;  Surgeon: Susy Frizzle, MD;  Location: [facility] OR;  Service: ENT;  Laterality: N/A;  . Peg placement 12/03/2010    Procedure: PERCUTANEOUS ENDOSCOPIC GASTROSTOMY (PEG) PLACEMENT;  Surgeon: Hart Carwin, MD;  Location: [facility] ENDOSCOPY;  Service: Endoscopy;  Laterality: N/A;    PT Assessment/Plan/Recommendation PT Assessment Clinical Impression Statement: see clinical statement as of 12/05/10; pt with relatively little functional change.  Lethargy remains, he shows more sign of distal function than proximal, but generally brunstom 1.  Can follow 1-step commands, but is lethargic and inconsistent.  Recommend SNF at discharge for continued therapies PT Recommendation/Assessment: Patient will need skilled PT in the acute care venue PT Problem List: Decreased strength;Decreased activity tolerance;Decreased balance;Decreased mobility;Decreased coordination;Decreased cognition;Decreased knowledge of use of DME;Impaired sensation;Impaired tone PT Therapy Diagnosis : Hemiplegia dominant side;Hemiplegia non-dominant side PT Plan PT Frequency: Min 3X/week PT Treatment/Interventions: Functional mobility training;Therapeutic exercise;Balance training;Neuromuscular re-education;Patient/family education;DME instruction PT  Recommendation Follow Up Recommendations: Skilled nursing facility PT Goals  Acute Rehab PT Goals PT Goal Formulation: Patient unable to participate in goal setting Time For Goal Achievement: 2 weeks Pt will Roll Supine to Right Side: with max assist PT Goal: Rolling Supine to Right Side - Progress: Other (comment) Pt will Roll Supine to Left Side: with max assist PT Goal: Rolling Supine to Left Side - Progress: Other (comment) Pt will go Supine/Side to Sit: with max assist PT Goal: Supine/Side to Sit - Progress: Other (comment) Pt will Sit at [facility] of Bed: with max assist;3-5 min;with bilateral upper extremity support PT Goal: Sit at Edge Of Bed - Progress: Other (comment) Pt will go Sit to Supine/Side: with max assist PT Goal: Sit to Supine/Side - Progress: Other (comment) Pt will Transfer Sit to Stand/Stand to Sit: with +2 total assist;Other (comment) (pt=25) PT Transfer Goal: Sit to Stand/Stand to Sit - Progress: Other (comment) Pt will Transfer Bed to Chair/Chair to Bed: with max assist PT Transfer Goal: Bed to Chair/Chair to Bed - Progress: Other (comment)  PT Evaluation Precautions/Restrictions  Restrictions Weight Bearing Restrictions: No Prior Functioning  Home Living Lives With: Alone Type of Home: House Home Layout: One level Home Access: Stairs to enter Entrance Stairs-Rails: Lawyer of Steps: 3-4 Bathroom Toilet: Standard Home Adaptive Equipment: Crutches Prior Function Level of Independence: Independent with basic ADLs;Independent with homemaking with ambulation;Independent with gait Driving: Yes Cognition Cognition Arousal/Alertness: Lethargic Overall Cognitive Status: Difficult to assess (Due to pt. with decreased verbalization and arousal) Difficult to assess due to: impaired communication;tracheostomy Orientation Level: Oriented to person Sensation/Coordination Sensation Light Touch: Impaired by gross assessment Stereognosis:  Impaired by gross assessment Hot/Cold: Impaired by gross assessment Proprioception: Impaired by gross assessment Additional Comments: Difficult to formally assess due to pt. not  following commands consistently Coordination Gross Motor Movements are Fluid and Coordinated: No Fine Motor Movements are Fluid and Coordinated: No Finger Nose Finger Test: Pt. unable to complete Heel Shin Test: Pt. unable Extremity Assessment RUE Assessment RUE Assessment: Exceptions to [facility] RUE PROM (degrees) Overall PROM Right Upper Extremity: Within functional limits for tasks performed RUE Strength RUE Overall Strength:  (Shoulder/elbow 2/5; hand 3/5) RUE Tone RUE Tone: Hypotonic Hypotonic Details: Pt. with proximal weakness >distal  LUE Assessment LUE Assessment: Exceptions to WFL LUE PROM (degrees) Overall PROM Left Upper Extremity: Within functional limits for tasks assessed LUE Strength LUE Overall Strength: Deficits;Other (Comment) (Shoulder/elbow 2/5; hand 3/5) LUE Tone LUE Tone: Hypotonic RLE Assessment RLE Assessment: Exceptions to [facility] RLE PROM (degrees) Overall PROM Right Lower Extremity: Within functional limits for tasks assessed RLE Strength RLE Overall Strength: Deficits RLE Overall Strength Comments: 1/5 at ankle; trace to 1/5 gross extension brunstrom 1 RLE Tone RLE Tone: Hypotonic LLE Assessment LLE Assessment: Exceptions to WFL LLE PROM (degrees) Overall PROM Left Lower Extremity: Within functional limits for tasks assessed LLE Strength LLE Overall Strength: Deficits LLE Overall Strength Comments: 1/5 at ankle; gross ext 1/5; Brunstrom 1 LLE Tone LLE Tone: Hypotonic Mobility (including Balance) Bed Mobility Bed Mobility: Yes Rolling Right: 1: +2 Total assist;Patient percentage (comment) (pt=0) Rolling Right Details (indicate cue type and reason): Max tactile cues to facilitate  Rolling Left: Not tested (comment) Right Sidelying to Sit: 1: +2 Total assist;Patient  percentage (comment) (pt=0%) Sitting - Scoot to Edge of Bed: 1: +2 Total assist Transfers Transfers: Yes Sit to Stand: 1: +2 Total assist;Patient percentage (comment);From bed (pt<10%) Sit to Stand Details (indicate cue type and reason): Max manual facilitation at hips and trunk to facilitate upright position and bilateral knees blocked due to buckling  Balance Balance Assessed: Yes Static Sitting Balance Static Sitting - Balance Support:  (pt showed no signs of truncal activation; no UE assist) Static Sitting - Level of Assistance: 1: +1 Total assist;Other (comment) (pt=0%) Static Sitting - Comment/# of Minutes: 15 Exercise    End of Session PT - End of Session Activity Tolerance: Patient limited by fatigue Patient left: in chair Nurse Communication: Mobility status for transfers;Need for lift equipment General Behavior During Session: Lethargic Cognition: Impaired Cognitive Impairment: Pt. with follows 1-step commands <25% of time.   Mescal Flinchbaugh, Eliseo Gum 12/12/2010, 3:35 PM  12/12/2010  [place] Bing, PT [phone] (pager)

## 2010-12-12 NOTE — Progress Notes (Signed)
Stroke Team Progress Note  SUBJECTIVE [name] who remains neurologically stable but with plateau in his improvement.he has been of ventilator now 2 days and is in step down unit.. Family concerned with edema in left knee, reports of blood in stool per RN to family last night. No documentation in medical record of this.   OBJECTIVE Most recent Vital Signs: Temp: 99.9 F (37.7 C) (11/30 0809) Temp src: Axillary (11/30 0809) BP: 131/99 mmHg (11/30 0923) Pulse Rate: 88  (11/30 0809) Respiratory Rate: 28 O2 Saturdation: 100%  CBG (last 3)   Basename 12/12/10 0407 12/11/10 2015 12/11/10 1706  GLUCAP 156* 136* 142*   Intake/Output from previous day: 11/29 0701 - 11/30 0700 In: 2330 [I.V.:500; NG/GT:330] Out: 1045 [Urine:1045]  IV Fluid Intake     . sodium chloride 20 mL/hr at 12/11/10 0600  . feeding supplement (JEVITY 1.2) 1,000 mL (12/12/10 0620)   Diet    tube feedings via peg, jevity at 65ml/hr  Activity  Up with assistance  DVT Prophylaxis  Lovenox 40 mg sq daily   Studies Results for orders placed during the hospital encounter of 11/19/10 (from the past 24 hour(s))  GLUCOSE, CAPILLARY     Status: Abnormal   Collection Time   12/11/10 12:03 PM      Component Value Range   Glucose-Capillary 121 (*) 70 - 99 (mg/dL)  GLUCOSE, CAPILLARY     Status: Abnormal  BASIC METABOLIC PANEL     Status: Abnormal      Component Value Range   Sodium 146 (*) 135 - 145 (mEq/L)   Potassium 3.5  3.5 - 5.1 (mEq/L)   Chloride 108  96 - 112 (mEq/L)   CO2 28  19 - 32 (mEq/L)   Glucose, Bld 155 (*) 70 - 99 (mg/dL)   BUN 27 (*) 6 - 23 (mg/dL)   Creatinine, Ser 1.61  0.50 - 1.35 (mg/dL)   Calcium 9.5  8.4 - 09.6 (mg/dL)   GFR calc non Af Amer >90  >90 (mL/min)   GFR calc Af Amer >90  >90 (mL/min)    Physical Exam More awake than yesterday, eyes open. Neck is supple without bruit. Distal pulses are felt. Cardiac exam atrial fibrillation rapid heart rate. Lungs  clear to auscultation. Dependent extremities are edematous.Left knee is swollen but without pain or tenderness.  Neurological exam Follows commands well.Eye movements are full range with saccadic dysmetria horizontally. Blinks to threat bilaterally. Minimal left lower facial weakness. Moves all 4 extremities against gravity but there is mild left-sided weakness. He is unable to hold extremities up against gravity. There is drift in all 4 extremities. Both plantars are downgoing.   ASSESSMENT [name] is a 56 y.o. male with a right hemispheric right brain cardioembolic infarcts secondary to atrial fibrillation, off warfarin for secondary stroke prevention due to hemorrhage occurring earlier in hospital stay. BP remains elevated, but better, after resuming home BP medications.   Hematechezia per RN last night and again today. ? Blood around PEG last night per family. Last Hgb yest 11.will have GI take  A look.  Stroke risk factors: atrial fibrillation, diabetes mellitus, hyperlipidemia and CAD  Hospital day # 23  TREATMENT/PLAN GI consult ([facility]) for bloody stools. Stop ASA and lovenox. Guaic stools. Check CBC today an in the am. Add SCDs.  Continue skilled nursing facility bed search.D/w family at bedside.  SHARON BIBY, AVNP, ANP-BC, GNP-BC [facility] Pager: [phone]  12/12/2010 9:35 AM  Dr. Delia Heady, Stroke Center Medical Director, has personally reviewed chart, pertinent data, examined the patient and developed the plan of care.

## 2010-12-13 DIAGNOSIS — Z9911 Dependence on respirator [ventilator] status: Secondary | ICD-10-CM

## 2010-12-13 DIAGNOSIS — Z930 Tracheostomy status: Secondary | ICD-10-CM

## 2010-12-13 LAB — BASIC METABOLIC PANEL
CO2: 29 mEq/L (ref 19–32)
Glucose, Bld: 80 mg/dL (ref 70–99)
Potassium: 3.4 mEq/L — ABNORMAL LOW (ref 3.5–5.1)
Sodium: 147 mEq/L — ABNORMAL HIGH (ref 135–145)

## 2010-12-13 LAB — CBC
Hemoglobin: 9 g/dL — ABNORMAL LOW (ref 13.0–17.0)
Platelets: 336 10*3/uL (ref 150–400)
RBC: 3.15 MIL/uL — ABNORMAL LOW (ref 4.22–5.81)
WBC: 12.9 10*3/uL — ABNORMAL HIGH (ref 4.0–10.5)

## 2010-12-13 LAB — GLUCOSE, CAPILLARY
Glucose-Capillary: 174 mg/dL — ABNORMAL HIGH (ref 70–99)
Glucose-Capillary: 171 mg/dL — ABNORMAL HIGH (ref 70–99)

## 2010-12-13 LAB — MAGNESIUM: Magnesium: 2.4 mg/dL (ref 1.5–2.5)

## 2010-12-13 LAB — PHOSPHORUS: Phosphorus: 3.5 mg/dL (ref 2.3–4.6)

## 2010-12-13 NOTE — Progress Notes (Signed)
Stroke Team Progress Note  SUBJECTIVE [name] who   Is lethargic this am and not arousable and had rough night per family. He continues to have mucus and dark blood in stools and has been seen by Dr Marina Goodell from GI who are following and feel he is not  A candidate for aggressive therapy. His HCT is decreased slightly from 9.8 to 9 this am.He did not receive any sedation last pm per RN. Lovenox was dc yesterday.  OBJECTIVE Most recent Vital Signs: Temp: 100.2 F (37.9 C) (12/01 0847) Temp src: Oral (12/01 0847) BP: 120/81 mmHg (12/01 1000) Pulse Rate: 40  (12/01 1000) Respiratory Rate: 20 O2 Saturdation: 96%  CBG (last 3)   Basename 12/13/10 0846 12/12/10 2359 12/12/10 1603  GLUCAP 148* 99 121*   Intake/Output from previous day: 11/30 0701 - 12/01 0700 In: 840 [I.V.:240; NG/GT:600] Out: 400 [Urine:400]  IV Fluid Intake:     . sodium chloride 20 mL/hr at 12/13/10 0313  . feeding supplement (JEVITY 1.2) 1,000 mL (12/13/10 0314)   Diet:tube feedings via peg, jevity at 58ml/hr        Activity: Up with assistance   DVT Prophylaxis:  SCDs  Studies: Results for orders placed during the hospital encounter of 11/19/10 (from the past 24 hour(s))  OCCULT BLOOD X 1 CARD TO LAB, STOOL     Status: Normal   Collection Time   12/12/10 11:30 AM      Component Value Range   Fecal Occult Bld POSITIVE    GLUCOSE, CAPILLARY     Status: Abnormal   Collection Time   12/12/10 11:52 AM      Component Value Range   Glucose-Capillary 142 (*) 70 - 99 (mg/dL)  CBC     Status: Abnormal   Collection Time   12/12/10  3:09 PM      Component Value Range   WBC 12.3 (*) 4.0 - 10.5 (K/uL)   RBC 3.38 (*) 4.22 - 5.81 (MIL/uL)   Hemoglobin 9.8 (*) 13.0 - 17.0 (g/dL)   HCT 56.2 (*) 13.0 - 52.0 (%)   MCV 89.9  78.0 - 100.0 (fL)   MCH 29.0  26.0 - 34.0 (pg)   MCHC 32.2  30.0 - 36.0 (g/dL)   RDW 86.5  78.4 - 69.6 (%)   Platelets 348  150 - 400 (K/uL)  GLUCOSE, CAPILLARY      Status: Abnormal   Collection Time   12/12/10  4:03 PM      Component Value Range   Glucose-Capillary 121 (*) 70 - 99 (mg/dL)  GLUCOSE, CAPILLARY     Status: Normal   Collection Time   12/12/10 11:59 PM      Component Value Range   Glucose-Capillary 99  70 - 99 (mg/dL)   Comment 1 Documented in Chart     Comment 2 Notify RN    CBC     Status: Abnormal   Collection Time   12/13/10  5:00 AM      Component Value Range   WBC 12.9 (*) 4.0 - 10.5 (K/uL)   RBC 3.15 (*) 4.22 - 5.81 (MIL/uL)   Hemoglobin 9.0 (*) 13.0 - 17.0 (g/dL)   HCT 29.5 (*) 28.4 - 52.0 (%)   MCV 89.8  78.0 - 100.0 (fL)   MCH 28.6  26.0 - 34.0 (pg)   MCHC 31.8  30.0 - 36.0 (g/dL)   RDW 13.2  44.0 - 10.2 (%)   Platelets  336  150 - 400 (K/uL)  BASIC METABOLIC PANEL     Status: Abnormal   Collection Time   12/13/10  5:00 AM      Component Value Range   Sodium 147 (*) 135 - 145 (mEq/L)   Potassium 3.4 (*) 3.5 - 5.1 (mEq/L)   Chloride 111  96 - 112 (mEq/L)   CO2 29  19 - 32 (mEq/L)   Glucose, Bld 80  70 - 99 (mg/dL)   BUN 25 (*) 6 - 23 (mg/dL)   Creatinine, Ser 1.61  0.50 - 1.35 (mg/dL)   Calcium 9.5  8.4 - 09.6 (mg/dL)   GFR calc non Af Amer 74 (*) >90 (mL/min)   GFR calc Af Amer 86 (*) >90 (mL/min)  PHOSPHORUS     Status: Normal   Collection Time   12/13/10  5:00 AM      Component Value Range   Phosphorus 3.5  2.3 - 4.6 (mg/dL)  MAGNESIUM     Status: Normal   Collection Time   12/13/10  5:00 AM      Component Value Range   Magnesium 2.4  1.5 - 2.5 (mg/dL)  GLUCOSE, CAPILLARY     Status: Abnormal   Collection Time   12/13/10  8:46 AM      Component Value Range   Glucose-Capillary 148 (*) 70 - 99 (mg/dL)   Comment 1 Documented in Chart     Comment 2 Notify RN       No results found.  Physical Exam:    More lethargic than yesterday, eyes open partially to stimuli. Neck is supple without bruit. Distal pulses are felt. Cardiac exam atrial fibrillation rapid heart rate. Lungs clear to auscultation.  Dependent extremities are edematous.Left knee is swollen but without pain or tenderness.  Neurological exam stuporose. Not following commands well.Eye movements are full range with saccadic dysmetria horizontally. Blinks to threat bilaterally. Minimal left lower facial weakness. Moves all 4 extremities minimally  against gravity but there is mild left-sided weakness. He is unable to hold extremities up against gravity.    ASSESSMENT [name] with a right hemispheric right brain cardioembolic infarcts secondary to atrial fibrillation, off warfarin for secondary stroke prevention due to hemorrhage occurring earlier in hospital stay. More lethargic today exact etiology unclear. Having mild GI bleed but hemodynamically stable and GI following.  Stroke risk factors:atrial fibrillation, diabetes mellitus, hyperlipidemia and CAD      Hospital day # 24  TREATMENT/PLAN GI consult Corinda Gub[facility]) following  for bloody stools.   Check CBC daily in the am. Continue SCDs. Continue skilled nursing facility bed search.D/w family at bedside.    Gates Rigg, MD [facility] Pager: [phone] 12/13/2010 10:36 AM

## 2010-12-13 NOTE — Progress Notes (Signed)
Patient ID: [name], male   DOB: [birth year], 55 y.o.   MRN: [id number] [facility] Progress Note  Subjective: [name] is tolerating TF without difficulty. He has had 2 small bloody mucoid bm's today. HGB is stable. He is somnolent currently and does not arouse.  Objective:  Vital signs in last 24 hours: Temp:  [99.9 F (37.7 C)-100.9 F (38.3 C)] 100.3 F (37.9 C) (12/01 1225) Pulse Rate:  [40-112] 87  (12/01 1300) Resp:  [17-36] 17  (12/01 1300) BP: (112-186)/(80-132) 115/83 mmHg (12/01 1300) SpO2:  [95 %-100 %] 98 % (12/01 1300) FiO2 (%):  [28 %] 28 % (12/01 1212) Weight:  [95 kg (209 lb 7 oz)-95.312 kg (210 lb 2 oz)] 210 lb 2 oz (95.312 kg) (12/01 0500) Last BM Date: 12/13/10 General:    Well-developed, African American male, sleeping, trached Heart:  Regular rate and rhythm; no murmurs Abdomen:  Soft, nontender and nondistended. Normal bowel sounds, PEG site benign. Slight amount of dried blood around the stoma  Extremities:  Without edema. Neurologic: Somnolent Intake/Output from previous day: 11/30 0701 - 12/01 0700 In: 840 [I.V.:240; NG/GT:600] Out: 400 [Urine:400] Intake/Output this shift: Total I/O In: 640 [I.V.:100; Other:360; NG/GT:180] Out: 450 [Urine:450]  Lab Results:  Basename 12/13/10 0500 12/12/10 1509 12/11/10 0430  WBC 12.9* 12.3* 8.9  HGB 9.0* 9.8* 11.0*  HCT 28.3* 30.4* 34.1*  PLT 336 348 413*   BMET  Basename 12/13/10 0500 12/12/10 0443  NA 147* 146*  K 3.4* 3.5  CL 111 108  CO2 29 28  GLUCOSE 80 155*  BUN 25* 27*  CREATININE 1.09 0.94  CALCIUM 9.5 9.5            Assessment / Plan: #1 55 yo male s/p CVA, with resp failure,s/p trach. #2 s/p PEG placement 11/21 per Dr. Juanda Chance #3 lower GI bleeding h, low-grade. Etiology not clear, may be secondary to internal hemorrhoids, proctitis, low-grade ischemia. No plans for endoscopic intervention at this time. Continue observation and serial hemoglobins.    LOS: 24 days    [name]  12/13/2010, 2:58 PM  I SAW PT TODAY. NO SIGNIFICANT BLEEDING. TRIVIAL RECTAL BLOOD IS LOWER IN ORIGIN. HG ACTUALLY INCREASED. NO PLANS FOR INVASIVE EVALUATION UNLESS SIGNIFICANT BLEEDING DEVELOPS. DISCUSSED WITH PT'S BROTHER AND DR Pearlean Brownie. PLEASE CALL FOR QUESTIONS OR PROBLEMS. WILL SIGN OFF.  JNP

## 2010-12-13 NOTE — Progress Notes (Signed)
Subjective: Care Progress Notes 12/12/2010 12:22 PM   TRUEMAN WORLDS is a 55 y.o. male former smoker admitted on 11/19/2010 with acute onset of slurred speech and Lt sided weakness. Dx Cerebellar ischemic cva then converted to bleed. Transferred to SDU 11/24 but returned to ICU 11/25 with decreased LOC which resolved with reinitiation of mechanical ventilation.    PCCM weekend cover- No acute concerns reported  Objective: Vital signs in last 24 hours: Temp:  [99.9 F (37.7 C)-100.9 F (38.3 C)] 100.3 F (37.9 C) (12/01 1225) Pulse Rate:  [40-112] 87  (12/01 1300) Resp:  [17-36] 17  (12/01 1300) BP: (112-186)/(80-132) 115/83 mmHg (12/01 1300) SpO2:  [95 %-100 %] 98 % (12/01 1300) FiO2 (%):  [28 %] 28 % (12/01 1212) Weight:  [95 kg (209 lb 7 oz)-95.312 kg (210 lb 2 oz)] 210 lb 2 oz (95.312 kg) (12/01 0500)  Exam- Unresponsive male, trach/ vent, tube feed Skin- no rash Nodes- none at neck Neuro-  I did not aggressively stimulate. Did not arouse to voice/ light touch. Lungs- clear on vent Heart- AFib 68-80 while i was in room Abd- obese, soft  Lab Results:  Basename 12/13/10 0500 12/12/10 1509  WBC 12.9* 12.3*  HGB 9.0* 9.8*  HCT 28.3* 30.4*  PLT 336 348   BMET  Basename 12/13/10 0500 12/12/10 0443  NA 147* 146*  K 3.4* 3.5  CL 111 108  CO2 29 28  GLUCOSE 80 155*  BUN 25* 27*  CREATININE 1.09 0.94  CALCIUM 9.5 9.5    Studies/Results: No results found.  Medications: I reviewed meds  Assessment/Plan: 1) CVA- cerebellar ischemic infarct>hemorrhage. Neurology note appreciated. He is less alert than previoulsy described 2) Acute resp failure- continues trach collar, unlabored 3) Afib- rate controlled. Lovenox. 4)HBP- better control with home meds 5) Hypernatremia- stable 6) DM - controlled on tube feed  LOS: 24 days   YOUNG,CLINTON D 12/13/2010, 2:29 PM

## 2010-12-14 DIAGNOSIS — R4020 Unspecified coma: Secondary | ICD-10-CM

## 2010-12-14 DIAGNOSIS — I4891 Unspecified atrial fibrillation: Secondary | ICD-10-CM

## 2010-12-14 DIAGNOSIS — J9600 Acute respiratory failure, unspecified whether with hypoxia or hypercapnia: Secondary | ICD-10-CM

## 2010-12-14 DIAGNOSIS — I629 Nontraumatic intracranial hemorrhage, unspecified: Secondary | ICD-10-CM

## 2010-12-14 LAB — BASIC METABOLIC PANEL
BUN: 24 mg/dL — ABNORMAL HIGH (ref 6–23)
Chloride: 109 mEq/L (ref 96–112)
Creatinine, Ser: 0.86 mg/dL (ref 0.50–1.35)
GFR calc Af Amer: >90 mL/min (ref >90)
Glucose, Bld: 165 mg/dL — ABNORMAL HIGH (ref 70–99)
Potassium: 3.2 mEq/L — ABNORMAL LOW (ref 3.5–5.1)

## 2010-12-14 LAB — GLUCOSE, CAPILLARY
Glucose-Capillary: 129 mg/dL — ABNORMAL HIGH (ref 70–99)
Glucose-Capillary: 137 mg/dL — ABNORMAL HIGH (ref 70–99)
Glucose-Capillary: 147 mg/dL — ABNORMAL HIGH (ref 70–99)
Glucose-Capillary: 98 mg/dL (ref 70–99)

## 2010-12-14 LAB — CBC
HCT: 31.2 % — ABNORMAL LOW (ref 39.0–52.0)
Hemoglobin: 9.8 g/dL — ABNORMAL LOW (ref 13.0–17.0)
MCHC: 31.4 g/dL (ref 30.0–36.0)
MCV: 90.7 fL (ref 78.0–100.0)
RDW: 14.6 % (ref 11.5–15.5)
WBC: 9.5 10*3/uL (ref 4.0–10.5)

## 2010-12-14 MED ORDER — WHITE PETROLATUM GEL
Status: AC
  Filled 2010-12-14: qty 5

## 2010-12-14 NOTE — Progress Notes (Signed)
Subjective:  [name] who Is quite awake this am and sitting up in bedside chair. He continues to have mucus and dark blood in stools and has been seen by Dr Marina Goodell from GI who are following and feel he is not A candidate for aggressive therapy. His HCT is stable at 9.8 %. Family is at bedside.he had no bloody stools today  Objective: Vital signs in last 24 hours: Temp:  [97.9 F (36.6 C)-100.3 F (37.9 C)] 99.2 F (37.3 C) (12/02 0812) Pulse Rate:  [49-102] 76  (12/02 1000) Resp:  [12-26] 16  (12/02 1000) BP: (98-157)/(65-106) 143/105 mmHg (12/02 1000) SpO2:  [97 %-100 %] 99 % (12/02 1000) FiO2 (%):  [28 %] 28 % (12/02 0900) Weight:  [98 kg (216 lb 0.8 oz)] 216 lb 0.8 oz (98 kg) (12/02 0500) Weight change: 3 kg (6 lb 9.8 oz) Last BM Date: 12/13/10  Intake/Output from previous day: 12/01 0701 - 12/02 0700 In: 2300 [I.V.:260; NG/GT:660] Out: 676 [Urine:675; Stool:1] Intake/Output this shift: Total I/O In: 560 [I.V.:80; Other:240; NG/GT:240] Out: 750 [Urine:750] Physical Exam :More awake than yesterday, sitting up in bedside chairi. Neck is supple without bruit. Distal pulses are felt. Cardiac exam atrial fibrillation rapid heart rate. Lungs clear to auscultation. Dependent extremities are edematous.Left knee is swollen but without pain or tenderness.  Neurological exam Awake alert and following commands well.Eye movements are full range with saccadic dysmetria horizontally. Blinks to threat bilaterally. Minimal left lower facial weakness. Moves all 4 extremities well against gravity but there is mild left-sided weakness. He is able to hold extremities up against gravity.       Lab Results:  Basename 12/14/10 0500 12/13/10 0500  WBC 9.5 12.9*  HGB 9.8* 9.0*  HCT 31.2* 28.3*  PLT 306 336   BMET  Basename 12/14/10 0500 12/13/10 0500  NA 145 147*  K 3.2* 3.4*  CL 109 111  CO2 29 29  GLUCOSE 165* 80  BUN 24* 25*  CREATININE 0.86 1.09  CALCIUM 9.4  9.5    Studies/Results:     Medications: I have reviewed the patient's current medications.  Assessment/Plan: [name] with a right hemispheric right brain cardioembolic infarcts secondary to atrial fibrillation, off warfarin for secondary stroke prevention due to hemorrhage occurring earlier in hospital stay. More lethargic today exact etiology unclear. Having mild GI bleed but hemodynamically stable and GI has signed off yesterday.Hope to send to SNF next week. D/W family..   LOS: 25 days   SETHI,PRAMODKUMAR P 12/14/2010, 11:45 AM

## 2010-12-14 NOTE — Progress Notes (Signed)
Subjective:      [name] is a 55 y.o. male former smoker admitted on 11/19/2010 with acute onset of slurred speech and Lt sided weakness. Dx Cerebellar ischemic cva then converted to bleed. Transferred to SDU 11/24 but returned to ICU 11/25 with decreased LOC which resolved with reinitiation of mechanical ventilation.    PCCM weekend cover-  Family in room. Pt up in chair. They report he is "hungry". TF is at 60 ml/ hr. He is alert enough to deny pain, nausea or other acute needs. Nurse denies active needs.   Objective: Vital signs in last 24 hours: Temp:  [97.9 F (36.6 C)-100.3 F (37.9 C)] 99.2 F (37.3 C) (12/02 0812) Pulse Rate:  [49-102] 76  (12/02 1000) Resp:  [12-26] 16  (12/02 1000) BP: (98-157)/(65-106) 143/105 mmHg (12/02 1000) SpO2:  [97 %-100 %] 99 % (12/02 1000) FiO2 (%):  [28 %] 28 % (12/02 0900) Weight:  [98 kg (216 lb 0.8 oz)] 216 lb 0.8 oz (98 kg) (12/02 0500)  Exam- Gen- Much more alert today. No distress.  Skin- no rash Nodes- none at neck Neuro- Passive, not moving arms or legs voluntarily. Lungs- clear. Trach collar. Janina Mayo site looks clean.  Heart- AFib  Abd- obese, soft,  PEG.  Lab Results:  Basename 12/14/10 0500 12/13/10 0500  WBC 9.5 12.9*  HGB 9.8* 9.0*  HCT 31.2* 28.3*  PLT 306 336   BMET  Basename 12/14/10 0500 12/13/10 0500  NA 145 147*  K 3.2* 3.4*  CL 109 111  CO2 29 29  GLUCOSE 165* 80  BUN 24* 25*  CREATININE 0.86 1.09  CALCIUM 9.4 9.5    Studies/Results: No results found.  Medications: I reviewed meds  Assessment/Plan: 1) CVA- cerebellar ischemic infarct>hemorrhage. More alert today. 2) Acute resp failure- continues trach collar, unlabored 3) Afib- rate controlled. Lovenox. 4)HBP- better control with home meds 5) Hypernatremia- stable 6) DM - controlled on tube feed  Lab 12/14/10 0811 12/14/10 0416 12/14/10 0015 12/13/10 2034 12/13/10 1701  GLUCAP 129* 121* 147* 171* 174*     LOS: 25 days   Eliezer Khawaja  D 12/14/2010, 11:38 AM

## 2010-12-15 ENCOUNTER — Inpatient Hospital Stay (HOSPITAL_COMMUNITY): Payer: 59

## 2010-12-15 DIAGNOSIS — Z930 Tracheostomy status: Secondary | ICD-10-CM

## 2010-12-15 DIAGNOSIS — Z9911 Dependence on respirator [ventilator] status: Secondary | ICD-10-CM

## 2010-12-15 LAB — DIFFERENTIAL
Eosinophils Absolute: 0.2 10*3/uL (ref 0.0–0.7)
Eosinophils Relative: 2 % (ref 0–5)
Lymphs Abs: 2 10*3/uL (ref 0.7–4.0)
Monocytes Relative: 6 % (ref 3–12)

## 2010-12-15 LAB — GLUCOSE, CAPILLARY
Glucose-Capillary: 117 mg/dL — ABNORMAL HIGH (ref 70–99)
Glucose-Capillary: 161 mg/dL — ABNORMAL HIGH (ref 70–99)
Glucose-Capillary: 88 mg/dL (ref 70–99)

## 2010-12-15 LAB — BASIC METABOLIC PANEL
BUN: 16 mg/dL (ref 6–23)
Calcium: 9.3 mg/dL (ref 8.4–10.5)
Chloride: 105 mEq/L (ref 96–112)
Creatinine, Ser: 0.72 mg/dL (ref 0.50–1.35)
GFR calc Af Amer: >90 mL/min (ref >90)

## 2010-12-15 LAB — CBC
MCH: 29.6 pg (ref 26.0–34.0)
MCHC: 32.8 g/dL (ref 30.0–36.0)
MCV: 90.4 fL (ref 78.0–100.0)
Platelets: 363 10*3/uL (ref 150–400)
RBC: 3.34 MIL/uL — ABNORMAL LOW (ref 4.22–5.81)

## 2010-12-15 MED ORDER — POTASSIUM CHLORIDE 20 MEQ/15ML (10%) PO LIQD: 40.0000 meq | Freq: Two times a day (BID) | ORAL | Status: DC

## 2010-12-15 MED ORDER — POTASSIUM CHLORIDE 10 MEQ/50ML IV SOLN
10.0000 meq | INTRAVENOUS | Status: AC
  Administered 2010-12-15 – 2010-12-16 (×6): 10 meq via INTRAVENOUS
  Filled 2010-12-15: qty 50
  Filled 2010-12-15: qty 100
  Filled 2010-12-15: qty 150

## 2010-12-15 NOTE — Progress Notes (Signed)
CSW spoke with pt brother, who states family is petitioning for gaurdianship. CSW offerred assistance to family, as needed. CSW discussed SNF placement with pt brother as well as possibility of LTAC, detailing insurance considerations for both options. Pt brother appreciative and states [facility] in [place] would be a good option, if insurance will cover. CSW continuing to work with facility to secure bed availability when pt medically stable.  Baxter Flattery, MSW [phone]

## 2010-12-15 NOTE — Progress Notes (Signed)
Subjective:      [name] is a 55 y.o. male former smoker admitted on 11/19/2010 with acute onset of slurred speech and Lt sided weakness. Dx Cerebellar ischemic cva then converted to bleed. Transferred to SDU 11/24 but returned to ICU 11/25 with decreased LOC which resolved with reinitiation of mechanical ventilation.    PCCM weekend cover-  Bleeding resolved with cuff up, svt this am   Objective: Vital signs in last 24 hours: Temp:  [98.3 F (36.8 C)-98.6 F (37 C)] 98.6 F (37 C) (12/03 0700) Pulse Rate:  [54-123] 88  (12/03 1012) Resp:  [14-28] 23  (12/03 1012) BP: (109-200)/(83-149) 162/115 mmHg (12/03 1012) SpO2:  [90 %-100 %] 100 % (12/03 1012) FiO2 (%):  [28 %-100 %] 40 % (12/03 0800) Weight:  [98 kg (216 lb 0.8 oz)-98.5 kg (217 lb 2.5 oz)] 217 lb 2.5 oz (98.5 kg) (12/03 0542)  Exam- Gen- Much more alert today. No distress.  Skin- no rash Nodes- none at neck Neuro- Passive, not moving arms or legs voluntarily. Lungs- clear. No bleeding Heart- AFib  Abd- obese, soft,  PEG.  Lab Results:  Basename 12/14/10 0500 12/13/10 0500  WBC 9.5 12.9*  HGB 9.8* 9.0*  HCT 31.2* 28.3*  PLT 306 336   BMET  Basename 12/14/10 0500 12/13/10 0500  NA 145 147*  K 3.2* 3.4*  CL 109 111  CO2 29 29  GLUCOSE 165* 80  BUN 24* 25*  CREATININE 0.86 1.09  CALCIUM 9.4 9.5    Studies/Results: Dg Chest Port 1 View  12/15/2010  *RADIOLOGY REPORT*  Clinical Data: Tracheostomy tube replacement.  Hemoptysis.  Assess for debris in bronchi.  PORTABLE CHEST - 1 VIEW  Comparison: Chest radiograph performed 12/08/2010  Findings: The patient's tracheostomy tube is noted ending 4-5 cm above the carina.  The tracheobronchial tree is incompletely assessed but appears grossly unremarkable.  There is no evidence of distal opacification to suggest significant debris.  Patchy right basilar retrocardiac airspace opacity raises question for atelectasis versus aspiration.  The patient's left PICC is  noted ending about the mid SVC.  No pleural effusion or pneumothorax is identified.  The cardiomediastinal silhouette is enlarged, more prominent than on the prior study, though this may reflect rotation.  No acute osseous abnormalities are seen.  IMPRESSION:  1.  Tracheostomy tube seen ending 4-5 cm above the carina. 2.  No evidence of distal collapse to suggest significant debris within the airways; the visualized portions of the tracheobronchial tree are grossly unremarkable. 3.  Right basilar airspace opacification raises question for atelectasis versus aspiration. 4.  Cardiomegaly noted.  Findings were discussed with Jonny Ruiz RN on [phone] at 06:46 a.m. on 12/15/2010.  Original Report Authenticated By: Tonia Ghent, M.D.    Medications: I reviewed meds  Assessment/Plan: 1) CVA- cerebellar ischemic infarct>hemorrhage. More alert today., per stoke team 2) Acute resp failure- continues trach colar with new 8 shiley cuff, hope will tamponade and heal, so far appears without bleeding Back to 24 hr s trach collar plan, pcxr in am  3) Afib- rate controlled. Lovenox now off with bleeding on trach, scd 4)HBP- better control with home meds, continue cardizem 5) Hypernatremia, hypokalemia-improved, replace K , recheck in am , check Mag in am  6) DM - controlled on tube feed  Lab 12/15/10 0939 12/15/10 0402 12/15/10 0003 12/14/10 1923 12/14/10 1615  GLUCAP 131* 161* 88 137* 126*     LOS: 26 days   Rochell Puett J. 12/15/2010, 11:09 AM

## 2010-12-15 NOTE — Progress Notes (Signed)
Name: [name] MRN: [id number] DOB: [birth year]  ELECTRONIC ICU PHYSICIAN NOTE  Problem:  Rapid response call:  Tracheal bleeding, off lovenox since 11/30  Intervention:  Chart review indicates placed by Dr Pollyann Kennedy 11/26 >  Have asked Rapid response team to contact Dr Pollyann Kennedy for eval/rx  Sandrea Hughs 12/15/2010, 4:07 AM

## 2010-12-15 NOTE — Progress Notes (Signed)
eLink Physician-Brief Progress Note Patient Name: [name] DOB: 26-Nov-1955 MRN: [id number]  Date of Service  12/15/2010   HPI/Events of Note   k 2.8. Normal creat  eICU Interventions  Iv kcl 10mq x 6 runs   Intervention Category Major Interventions: Electrolyte abnormality - evaluation and management Intermediate Interventions: Bleeding - evaluation and treatment with blood products Minor Interventions: Routine modifications to care plan (e.g. PRN medications for pain, fever)  Natash Berman 12/15/2010, 4:34 PM

## 2010-12-15 NOTE — Progress Notes (Signed)
Emergency trach change by Md, due to bleeding issues. Pt placement confirmed by end-tidal co2, pending cxr, bbs equal.

## 2010-12-15 NOTE — Progress Notes (Signed)
Patient transferred to 2302 from 2603 via hospital bed, accompanied by rapid response RN, Respiratory therapy, and primary nurse. Report given to Jonny Ruiz, RN at bedside. Patient's family notified of transfer.

## 2010-12-15 NOTE — Progress Notes (Signed)
Stroke Team Progress Note  SUBJECTIVE [name] who was transferred from step down to the ICU last night after experiencing respiratory difficulty and bleeding from trach site. ENT removed his #6 cuffless trach  And replaced with a #8 cuffed trach this am. Patients brother feels his symptoms are improving.   OBJECTIVE Most recent Vital Signs: Temp: 98.7 F (37.1 C) (12/03 1600) Temp src: Oral (12/03 1600) BP: 178/123 mmHg (12/03 1600) Pulse Rate: 84  (12/03 1600) Respiratory Rate: 21 O2 Saturdation: 100%  CBG (last 3)   Basename 12/15/10 1530 12/15/10 1225 12/15/10 0939  GLUCAP 144* 117* 131*   Intake/Output from previous day: 12/02 0701 - 12/03 0700 In: 2260 [I.V.:680; NG/GT:360] Out: 2522 [Urine:2520; Stool:2]  IV Fluid Intake     . sodium chloride 20 mL/hr at 12/15/10 1200  . feeding supplement (JEVITY 1.2) 1,000 mL (12/14/10 1641)   Diet    NPO, tube feedings at 58ml/hr  Activity  Up with assistance  DVT Prophylaxis  SCDs   Studies BASIC METABOLIC PANEL     Status: Abnormal   Collection Time   12/15/10 11:00 AM      Component Value Range   Sodium 146 (*) 135 - 145 (mEq/L)   Potassium 2.8 (*) 3.5 - 5.1 (mEq/L)   Chloride 105  96 - 112 (mEq/L)   CO2 32  19 - 32 (mEq/L)   Glucose, Bld 129 (*) 70 - 99 (mg/dL)   BUN 16  6 - 23 (mg/dL)   Creatinine, Ser 1.61  0.50 - 1.35 (mg/dL)   Calcium 9.3  8.4 - 09.6 (mg/dL)   GFR calc non Af Amer >90  >90 (mL/min)   GFR calc Af Amer >90  >90 (mL/min)  PHOSPHORUS     Status: Normal   Collection Time   12/15/10 11:00 AM      Component Value Range   Phosphorus 2.9  2.3 - 4.6 (mg/dL)  MAGNESIUM     Status: Normal   Collection Time   12/15/10 11:00 AM      Component Value Range   Magnesium 2.0  1.5 - 2.5 (mg/dL)  CBC     Status: Abnormal   Collection Time   12/15/10 11:00 AM      Component Value Range   WBC 11.0 (*) 4.0 - 10.5 (K/uL)   RBC 3.34 (*) 4.22 - 5.81 (MIL/uL)   Hemoglobin 9.9 (*) 13.0 -  17.0 (g/dL)   HCT 04.5 (*) 40.9 - 52.0 (%)   MCV 90.4  78.0 - 100.0 (fL)   MCH 29.6  26.0 - 34.0 (pg)   MCHC 32.8  30.0 - 36.0 (g/dL)   RDW 81.1  91.4 - 78.2 (%)   Platelets 363  150 - 400 (K/uL)  DIFFERENTIAL     Status: Abnormal   Collection Time   12/15/10 11:00 AM      Component Value Range   Neutrophils Relative 74  43 - 77 (%)   Neutro Abs 8.2 (*) 1.7 - 7.7 (K/uL)   Lymphocytes Relative 18  12 - 46 (%)   Lymphs Abs 2.0  0.7 - 4.0 (K/uL)   Monocytes Relative 6  3 - 12 (%)   Monocytes Absolute 0.7  0.1 - 1.0 (K/uL)   Eosinophils Relative 2  0 - 5 (%)   Eosinophils Absolute 0.2  0.0 - 0.7 (K/uL)   Basophils Relative 0  0 - 1 (%)   Basophils Absolute 0.0  0.0 -  0.1 (K/uL)     Dg Chest Port 1 View 12/15/2010   1.  Tracheostomy tube seen ending 4-5 cm above the carina. 2.  No evidence of distal collapse to suggest significant debris within the airways; the visualized portions of the tracheobronchial tree are grossly unremarkable. 3.  Right basilar airspace opacification raises question for atelectasis versus aspiration. 4.  Cardiomegaly noted.   Physical Exam  More awake than yesterday, sitting up in bedside chairi. Neck is supple without bruit. Distal pulses are felt. Cardiac exam atrial fibrillation rapid heart rate. Lungs clear to auscultation. Dependent extremities are edematous.Left knee is swollen but without pain or tenderness.  Neurological exam Awake alert and following commands well.Eye movements are full range with saccadic dysmetria horizontally. Blinks to threat bilaterally. Minimal left lower facial weakness. Moves all 4 extremities well against gravity but there is mild left-sided weakness. He is able to hold extremities up against gravity.   ASSESSMENT [name] with right hemispheric brain infarcts, secondary to atrial fibrillation, off antiplatelets/anticoagulats secondary to post ischemic stroke cerebral hemorrhage,  GIB and now trach site  hemorrhage. Trach replaced this am and hope will control bleeding. Nothing to offer from the GI standpoint. Will leave off ASA and lovenox.   Stroke risk factors:atrial fibrillation, diabetes mellitus, hyperlipidemia and CAD   Hospital day # 26  TREATMENT/PLAN Continue off ASA and lovenox. for secondary stroke prevention. CCM monitoring in ICU. Eventual skilled nursing facility placement. Will follow.  Joaquin Music, ANP-BC, GNP-BC [facility] Pager: [phone] 12/15/2010 4:13 PM  Dr. Delia Heady, Stroke Center Medical Director, has personally reviewed chart, pertinent data, examined the patient and developed the plan of care.

## 2010-12-15 NOTE — Progress Notes (Addendum)
Nutrition Follow-Up  Pt transferred to 2300 from 2600 due to acute bleeding and airway distress.  Pt remains NPO, continues on Jevity 1.2 formula at 60 ml/hr via PEG tube providing 1,728 kcals, 80 g protein, 1162 ml of free water. Pt on ATC, off vent support. GI following for bloody stools.  Re-estimated Nutrition Needs: 2,200-2,300 kcals, 120-130 gms  Weight up at 98.5 kg (12/3)  Labs: BMET    Component Value Date/Time   NA 145 12/14/2010 0500   K 3.2* 12/14/2010 0500   CL 109 12/14/2010 0500   CO2 29 12/14/2010 0500   GLUCOSE 165* 12/14/2010 0500   BUN 24* 12/14/2010 0500   CREATININE 0.86 12/14/2010 0500   CALCIUM 9.4 12/14/2010 0500   GFRNONAA >90 12/14/2010 0500   GFRAA >90 12/14/2010 0500    I/O last 3 completed shifts: In: 3260 [I.V.:720; Other:1940; NG/GT:600] Out: 2748 [Urine:2745; Stool:3] Total I/O In: 190 [I.V.:60; Other:130] Out: 600 [Urine:600]   CBG (last 3)   Basename 12/15/10 0939 12/15/10 0402 12/15/10 0003  GLUCAP 131* 161* 88    Nutrition Dx: Inadequate Oral Intake, ongoing Goal: EN regimen to meet 60-70% of estimated kcals, 100% of protein needs per ASPEN guidelines in critically ill obese patients, unmet  Plan:  Recommend EN regimen change to Promote formula at goal rate of 55 ml/hr with Prostat liquid protein 30 ml TID via tube to provide 1,536 kcals (70% of estimated kcal needs), 128 g protein (100% of estimated protein needs), 1107 ml of free water  RD to follow for nutrition care plan  Kirkland Hun, RD, LDN Pager#: [phone]

## 2010-12-15 NOTE — Progress Notes (Signed)
Patient coughed two large blood clots from trach. No respiratory distress, O2 Sats 98% on trach collar 28% oxygen. Dr. Sherene Sires notified, no orders received.

## 2010-12-15 NOTE — Progress Notes (Signed)
Patient coughed large blood clot from trach and bleeding note around trach site. Rapid response notified, to assess patient.

## 2010-12-15 NOTE — Progress Notes (Signed)
Occupational Therapy Treatment Patient Details Name: [name] MRN: [id number] DOB: [birth year] Today's Date: 12/15/2010  OT Assessment/Plan OT Assessment/Plan Comments on Treatment Session: Pt. reports blurry vision and decreased saccades to right past midline, pt. unable to read date presented ~8 ft away on wall at midline. Pt. able to identify correct number of digits held up to patient in left visual field with rt. eye occluded. Unable to further continue visual assessment due to pt. becoming frustrated with himself and eyes becoming fatigued.  OT Plan: Discharge plan remains appropriate OT Frequency: Min 2X/week Follow Up Recommendations: Skilled nursing facility Equipment Recommended: Defer to next venue OT Goals Acute Rehab OT Goals OT Goal Formulation: With patient Time For Goal Achievement: 2 weeks ADL Goals Pt Will Perform Grooming: Supported;with cueing (comment type and amount);Sitting, chair;with set-up ADL Goal: Grooming - Progress: Revised (modified due to lack of progress/goal met) (Goal met) Additional ADL Goal #1: Pt. will roll right and left with total assist +2 pt=30% to increase participation with ADLs. Additional ADL Goal #2: Pt. will sit EOB ~30mins with mod assist trunk support in preparation for ADLs  OT Treatment Precautions/Restrictions  Precautions Precautions: Fall (Increased BP) Precaution Comments: Spoke with RN who notes ok for Therapy to see pt.  Only perfomed bed level activity secondary to elevated BP.   Required Braces or Orthoses: No Restrictions Weight Bearing Restrictions: No   Vitals: Initial BP 175/117 MAP 132 HR 100-110's During BP 190/135 MAP 149 HR 120's After BP 189/127 MAP 141 HR 110's  ADL ADL Eating/Feeding: NPO Grooming: Performed;Wash/dry face;Moderate assistance Grooming Details (indicate cue type and reason): With use of Rt UE and hand over hand facilitation to maintain grasp of wash cloth and for elbow support to complete  task due to decreased coordination and strength. Where Assessed - Grooming: Supine, head of bed up Upper Body Dressing: Performed;Maximal assistance Upper Body Dressing Details (indicate cue type and reason): With donning gown and mod verbal cues for inititation and completiong of task Where Assessed - Upper Body Dressing: Supine, head of bed up ADL Comments: Pt. with much improved arousal today and increased consistency in following commands. Pt. with decreased coordination noted of bilateral upper extremities. Pt. with increased BP limiting activity today and just transferred to 2300 this A.M due to bloody stools. Pt. incontinent of bowels with rolling and provided increased time for hygiene. Pt's family present at the end of session and educated family on pt's current level of functioning and role of therapy during this time. Pt. provided with repositioning of bilateral pillows under arms to facilitate shoulder joint integrity due to subluxations. Mobility  Bed Mobility Bed Mobility: Yes Rolling Right: 1: +2 Total assist;Patient percentage (comment) (pt 20%) Rolling Right Details (indicate cue type and reason): Max cueing and facilitation for technique and use of L UE/LE to A with rolling Rolling Left: 1: +2 Total assist;Patient percentage (comment);With rail (pt 40%) Rolling Left Details (indicate cue type and reason): cues and facilitation for movement towards L side and safe technique.   Transfers Transfers: No Sit to Stand: Not tested (comment) Exercises General Exercises - Lower Extremity Ankle Circles/Pumps: AROM;Both;10 reps;Supine  End of Session OT - End of Session Activity Tolerance: Other (comment);Treatment limited secondary to medical complications (Comment) (Increased BP) Patient left: in bed;with call bell in reach General Behavior During Session: [facility] for tasks performed Cognition: Impaired Cognitive Impairment: pt with flat affect, however arouses to name and attempts to  follows most one-step directions.  Co-treat with Delsa Sale Pager [phone]  12/15/2010, 10:42 AM

## 2010-12-15 NOTE — Progress Notes (Signed)
Physical Therapy Treatment Patient Details Name: [name] MRN: [id number] DOB: [birth year] Today's Date: 12/15/2010  PT Assessment/Plan  PT - Assessment/Plan Comments on Treatment Session: pt more alert and attempting to participate in therapy today.  BP limited activity.   PT Plan: Discharge plan remains appropriate PT Frequency: Min 3X/week Follow Up Recommendations: Skilled nursing facility Equipment Recommended: Defer to next venue PT Goals  Acute Rehab PT Goals PT Goal: Rolling Supine to Right Side - Progress: Progressing toward goal PT Goal: Rolling Supine to Left Side - Progress: Progressing toward goal PT Goal: Supine/Side to Sit - Progress: Not met PT Goal: Sit at Edge Of Bed - Progress: Not met PT Goal: Sit to Supine/Side - Progress: Not met PT Transfer Goal: Bed to Chair/Chair to Bed - Progress: Not met  PT Treatment Precautions/Restrictions  Precautions Precautions: Fall (Increased BP) Precaution Comments: Spoke with RN who notes ok for Therapy to see pt.  Only perfomed bed level activity secondary to elevated BP.   Required Braces or Orthoses: No Restrictions Weight Bearing Restrictions: No Mobility (including Balance) Bed Mobility Bed Mobility: Yes Rolling Right: 1: +2 Total assist;Patient percentage (comment) (pt 20%) Rolling Right Details (indicate cue type and reason): Max cueing and facilitation for technique and use of L UE/LE to A with rolling Rolling Left: 1: +2 Total assist;Patient percentage (comment);With rail (pt 40%) Rolling Left Details (indicate cue type and reason): cues and facilitation for movement towards L side and safe technique.   Transfers Transfers: No  Vitals Initial BP 175/117 MAP 132 HR 100-110's During BP 190/135 MAP 149 HR 120's After BP 189/127 MAP 141 HR 110's Exercise  General Exercises - Lower Extremity Ankle Circles/Pumps: AROM;Both;10 reps;Supine End of Session PT - End of Session Activity Tolerance: Patient limited by  fatigue;Treatment limited secondary to medical complications (Comment) (Limited by elevated BP) Patient left: in bed;with call bell in reach Nurse Communication:  (Mobility status in bed and BP.  ) General Behavior During Session: [facility] for tasks performed Cognition: Impaired Cognitive Impairment: pt with flat affect, however arouses to name and attempts to follows most one-step directions.    [name], [place] [phone] 12/15/2010, 10:35 AM

## 2010-12-15 NOTE — Progress Notes (Signed)
UR Completed.  Sriyan Cutting [name] [phone] 12/15/2010  

## 2010-12-15 NOTE — Progress Notes (Signed)
Svt also,  Check ZSTAT bmet, lytes, mg,phos No distress  Mcarthur Rossetti. Tyson Alias, MD, FACP Pgr: [phone] [facility]

## 2010-12-15 NOTE — Consults (Signed)
Reason for Consult:Acute tracheal bleeding Referring Physician: Rapid Response   [name] is an 55 y.o. male.  HPI: Pt s/p trach for respiratory failure after CVA on 11/16. Stable, pt off vent. Developed acute bleeding and airway distress. Transferred to ICU.  Past Medical History  Diagnosis Date  . Diabetes mellitus   . Coronary artery disease   . Hypertension   . Gout   . CHF (congestive heart failure)   . Afib   . Arthritis     Past Surgical History  Procedure Date  . Tracheostomy tube placement 11/28/2010    Procedure: TRACHEOSTOMY;  Surgeon: Susy Frizzle, MD;  Location: [facility] OR;  Service: ENT;  Laterality: N/A;  . Peg placement 12/03/2010    Procedure: PERCUTANEOUS ENDOSCOPIC GASTROSTOMY (PEG) PLACEMENT;  Surgeon: Hart Carwin, MD;  Location: [facility] ENDOSCOPY;  Service: Endoscopy;  Laterality: N/A;    History reviewed. No pertinent family history.  Social History:  reports that he quit smoking about 4 years ago. His smoking use included Cigarettes. He does not have any smokeless tobacco history on file. He reports that he drinks about 6.6 ounces of alcohol per week. He reports that he does not use illicit drugs.  Allergies: No Known Allergies  Medications: I have reviewed the patient's current medications.  Results for orders placed during the hospital encounter of 11/19/10 (from the past 48 hour(s))  GLUCOSE, CAPILLARY     Status: Abnormal   Collection Time   12/13/10  8:46 AM      Component Value Range Comment   Glucose-Capillary 148 (*) 70 - 99 (mg/dL)    Comment 1 Documented in Chart      Comment 2 Notify RN     GLUCOSE, CAPILLARY     Status: Abnormal   Collection Time   12/13/10 12:25 PM      Component Value Range Comment   Glucose-Capillary 144 (*) 70 - 99 (mg/dL)    Comment 1 Documented in Chart      Comment 2 Notify RN     GLUCOSE, CAPILLARY     Status: Abnormal   Collection Time   12/13/10  5:01 PM      Component Value Range Comment   Glucose-Capillary  174 (*) 70 - 99 (mg/dL)    Comment 1 Documented in Chart      Comment 2 Notify RN     GLUCOSE, CAPILLARY     Status: Abnormal   Collection Time   12/13/10  8:34 PM      Component Value Range Comment   Glucose-Capillary 171 (*) 70 - 99 (mg/dL)   GLUCOSE, CAPILLARY     Status: Abnormal   Collection Time   12/14/10 12:15 AM      Component Value Range Comment   Glucose-Capillary 147 (*) 70 - 99 (mg/dL)   GLUCOSE, CAPILLARY     Status: Abnormal   Collection Time   12/14/10  4:16 AM      Component Value Range Comment   Glucose-Capillary 121 (*) 70 - 99 (mg/dL)   CBC     Status: Abnormal   Collection Time   12/14/10  5:00 AM      Component Value Range Comment   WBC 9.5  4.0 - 10.5 (K/uL)    RBC 3.44 (*) 4.22 - 5.81 (MIL/uL)    Hemoglobin 9.8 (*) 13.0 - 17.0 (g/dL)    HCT 40.9 (*) 81.1 - 52.0 (%)    MCV 90.7  78.0 - 100.0 (fL)  MCH 28.5  26.0 - 34.0 (pg)    MCHC 31.4  30.0 - 36.0 (g/dL)    RDW 40.1  02.7 - 25.3 (%)    Platelets 306  150 - 400 (K/uL)   BASIC METABOLIC PANEL     Status: Abnormal   Collection Time   12/14/10  5:00 AM      Component Value Range Comment   Sodium 145  135 - 145 (mEq/L)    Potassium 3.2 (*) 3.5 - 5.1 (mEq/L)    Chloride 109  96 - 112 (mEq/L)    CO2 29  19 - 32 (mEq/L)    Glucose, Bld 165 (*) 70 - 99 (mg/dL)    BUN 24 (*) 6 - 23 (mg/dL)    Creatinine, Ser 6.64  0.50 - 1.35 (mg/dL)    Calcium 9.4  8.4 - 10.5 (mg/dL)    GFR calc non Af Amer >90  >90 (mL/min)    GFR calc Af Amer >90  >90 (mL/min)   GLUCOSE, CAPILLARY     Status: Abnormal   Collection Time   12/14/10  8:11 AM      Component Value Range Comment   Glucose-Capillary 129 (*) 70 - 99 (mg/dL)    Comment 1 Documented in Chart      Comment 2 Notify RN     GLUCOSE, CAPILLARY     Status: Abnormal   Collection Time   12/14/10 11:51 AM      Component Value Range Comment   Glucose-Capillary 117 (*) 70 - 99 (mg/dL)    Comment 1 Documented in Chart      Comment 2 Notify RN     GLUCOSE, CAPILLARY      Status: Abnormal   Collection Time   12/14/10  4:15 PM      Component Value Range Comment   Glucose-Capillary 126 (*) 70 - 99 (mg/dL)    Comment 1 Documented in Chart      Comment 2 Notify RN     GLUCOSE, CAPILLARY     Status: Abnormal   Collection Time   12/14/10  7:23 PM      Component Value Range Comment   Glucose-Capillary 137 (*) 70 - 99 (mg/dL)   GLUCOSE, CAPILLARY     Status: Normal   Collection Time   12/15/10 12:03 AM      Component Value Range Comment   Glucose-Capillary 88  70 - 99 (mg/dL)   GLUCOSE, CAPILLARY     Status: Abnormal   Collection Time   12/15/10  4:02 AM      Component Value Range Comment   Glucose-Capillary 161 (*) 70 - 99 (mg/dL)     No results found.  QIH:KVQQV reviewed  Blood pressure 177/119, pulse 123, temperature 98.4 F (36.9 C), temperature source Oral, resp. rate 26, height 6' (1.829 m), weight 98 kg (216 lb 0.8 oz), SpO2 100.00%.  PHYSICAL EXAM: Sig. Clots and min active bleeding from stoma, stridor+. Cuffless trach and slots removed from airway. Bleeding site not ID'ed. #8 Shiley cuffed trach placed, no further bleeding. Airway stable.  Studies Reviewed:N/A   Assessment/Plan: Acute stomal bleeding, stable with trach change. Avoid anti-coags, monitor for additional bleeding. #8 with cuff elevated in-place. Will follow, reconsult if further acute problems.   Cresencia Asmus L 12/15/2010, 5:43 AM

## 2010-12-16 ENCOUNTER — Inpatient Hospital Stay (HOSPITAL_COMMUNITY): Payer: 59

## 2010-12-16 DIAGNOSIS — J9600 Acute respiratory failure, unspecified whether with hypoxia or hypercapnia: Secondary | ICD-10-CM

## 2010-12-16 DIAGNOSIS — R4020 Unspecified coma: Secondary | ICD-10-CM

## 2010-12-16 DIAGNOSIS — I4891 Unspecified atrial fibrillation: Secondary | ICD-10-CM

## 2010-12-16 DIAGNOSIS — I629 Nontraumatic intracranial hemorrhage, unspecified: Secondary | ICD-10-CM

## 2010-12-16 LAB — GLUCOSE, CAPILLARY
Glucose-Capillary: 102 mg/dL — ABNORMAL HIGH (ref 70–99)
Glucose-Capillary: 154 mg/dL — ABNORMAL HIGH (ref 70–99)
Glucose-Capillary: 173 mg/dL — ABNORMAL HIGH (ref 70–99)

## 2010-12-16 LAB — CBC
Hemoglobin: 9.2 g/dL — ABNORMAL LOW (ref 13.0–17.0)
MCH: 28.9 pg (ref 26.0–34.0)
MCV: 89.3 fL (ref 78.0–100.0)
RBC: 3.18 MIL/uL — ABNORMAL LOW (ref 4.22–5.81)

## 2010-12-16 LAB — DIFFERENTIAL
Eosinophils Absolute: 0.3 10*3/uL (ref 0.0–0.7)
Eosinophils Relative: 4 % (ref 0–5)
Lymphs Abs: 1.8 10*3/uL (ref 0.7–4.0)
Monocytes Relative: 8 % (ref 3–12)

## 2010-12-16 LAB — BASIC METABOLIC PANEL
CO2: 32 mEq/L (ref 19–32)
Glucose, Bld: 155 mg/dL — ABNORMAL HIGH (ref 70–99)
Potassium: 3.3 mEq/L — ABNORMAL LOW (ref 3.5–5.1)
Sodium: 144 mEq/L (ref 135–145)

## 2010-12-16 MED ORDER — POTASSIUM CHLORIDE 10 MEQ/50ML IV SOLN
10.0000 meq | INTRAVENOUS | Status: AC
  Administered 2010-12-16 (×4): 10 meq via INTRAVENOUS
  Filled 2010-12-16: qty 50
  Filled 2010-12-16: qty 150

## 2010-12-16 MED ORDER — LABETALOL HCL 100 MG PO TABS
100.0000 mg | ORAL_TABLET | Freq: Two times a day (BID) | ORAL | Status: DC
  Administered 2010-12-16 – 2010-12-18 (×5): 100 mg via ORAL
  Filled 2010-12-16 (×6): qty 1

## 2010-12-16 MED ORDER — LABETALOL HCL 5 MG/ML IV SOLN
10.0000 mg | INTRAVENOUS | Status: DC | PRN
  Administered 2010-12-16 – 2010-12-18 (×4): 10 mg via INTRAVENOUS
  Filled 2010-12-16 (×7): qty 4

## 2010-12-16 MED ORDER — POTASSIUM CHLORIDE 20 MEQ/15ML (10%) PO LIQD
ORAL | Status: AC
  Filled 2010-12-16: qty 30

## 2010-12-16 MED ORDER — POTASSIUM CHLORIDE 20 MEQ/15ML (10%) PO LIQD
40.0000 meq | Freq: Once | ORAL | Status: AC
  Administered 2010-12-16: 40 meq
  Filled 2010-12-16: qty 30

## 2010-12-16 MED ORDER — PANTOPRAZOLE SODIUM 40 MG PO PACK
40.0000 mg | PACK | Freq: Every day | ORAL | Status: DC
  Administered 2010-12-16 – 2010-12-22 (×7): 40 mg
  Filled 2010-12-16 (×7): qty 20

## 2010-12-16 MED ORDER — DILTIAZEM 12 MG/ML ORAL SUSPENSION
90.0000 mg | Freq: Four times a day (QID) | ORAL | Status: DC
  Administered 2010-12-16 – 2010-12-22 (×25): 90 mg
  Filled 2010-12-16 (×28): qty 9

## 2010-12-16 NOTE — Plan of Care (Signed)
Problem: Phase I Progression Outcomes Goal: Voiding-avoid urinary catheter unless indicated Outcome: Progressing Has condom cath   Problem: Phase II Progression Outcomes Goal: Discharge plan established Outcome: Progressing LTAC vs SNF

## 2010-12-16 NOTE — Progress Notes (Signed)
MD aware of elevated BP. Labetalol PRN ordered.

## 2010-12-16 NOTE — Progress Notes (Signed)
Stroke Team Progress Note  SUBJECTIVE [name] who remains off the vent. No further hmg from trach site per RN. Staples remain in place.hemodynamically stable except mild elevated BP.  OBJECTIVE Most recent Vital Signs: Temp: 98.5 F (36.9 C) (12/04 0727) Temp src: Oral (12/04 0727) BP: 144/98 mmHg (12/04 0756) Pulse Rate: 88  (12/04 0756) Respiratory Rate: 23 O2 Saturdation: 98%  CBG (last 3)   Basename 12/16/10 0720 12/16/10 0005 12/15/10 1921  GLUCAP 127* 102* 156*   Intake/Output from previous day: 12/03 0701 - 12/04 0700 In: 1775 [I.V.:255; NG/GT:120; IV Piggyback:350] Out: 2370 [Urine:2370]  IV Fluid Intake     . sodium chloride 20 mL/hr at 12/16/10 0600  . feeding supplement (JEVITY 1.2) 1,000 mL (12/14/10 1641)   Diet    tube feedings  Activity  Up with assistance  DVT Prophylaxis  SCDs   Studies BASIC METABOLIC PANEL     Status: Abnormal   Collection Time   12/15/10 11:00 AM      Component Value Range   Sodium 146 (*) 135 - 145 (mEq/L)   Potassium 2.8 (*) 3.5 - 5.1 (mEq/L)   Chloride 105  96 - 112 (mEq/L)   CO2 32  19 - 32 (mEq/L)   Glucose, Bld 129 (*) 70 - 99 (mg/dL)   BUN 16  6 - 23 (mg/dL)   Creatinine, Ser 1.61  0.50 - 1.35 (mg/dL)   Calcium 9.3  8.4 - 09.6 (mg/dL)   GFR calc non Af Amer >90  >90 (mL/min)   GFR calc Af Amer >90  >90 (mL/min)  PHOSPHORUS     Status: Normal   Collection Time   12/15/10 11:00 AM      Component Value Range   Phosphorus 2.9  2.3 - 4.6 (mg/dL)  MAGNESIUM     Status: Normal   Collection Time   12/15/10 11:00 AM      Component Value Range   Magnesium 2.0  1.5 - 2.5 (mg/dL)  CBC     Status: Abnormal   Collection Time   12/15/10 11:00 AM      Component Value Range   WBC 11.0 (*) 4.0 - 10.5 (K/uL)   RBC 3.34 (*) 4.22 - 5.81 (MIL/uL)   Hemoglobin 9.9 (*) 13.0 - 17.0 (g/dL)   HCT 04.5 (*) 40.9 - 52.0 (%)   MCV 90.4  78.0 - 100.0 (fL)   MCH 29.6  26.0 - 34.0 (pg)   MCHC 32.8  30.0 - 36.0  (g/dL)   RDW 81.1  91.4 - 78.2 (%)   Platelets 363  150 - 400 (K/uL)  DIFFERENTIAL     Status: Abnormal   Collection Time   12/15/10 11:00 AM      Component Value Range   Neutrophils Relative 74  43 - 77 (%)   Neutro Abs 8.2 (*) 1.7 - 7.7 (K/uL)   Lymphocytes Relative 18  12 - 46 (%)   Lymphs Abs 2.0  0.7 - 4.0 (K/uL)   Monocytes Relative 6  3 - 12 (%)   Monocytes Absolute 0.7  0.1 - 1.0 (K/uL)   Eosinophils Relative 2  0 - 5 (%)   Eosinophils Absolute 0.2  0.0 - 0.7 (K/uL)   Basophils Relative 0  0 - 1 (%)   Basophils Absolute 0.0  0.0 - 0.1 (K/uL)  BASIC METABOLIC PANEL     Status: Abnormal   Collection Time   12/16/10  4:42 AM  Component Value Range   Sodium 144  135 - 145 (mEq/L)   Potassium 3.3 (*) 3.5 - 5.1 (mEq/L)   Chloride 105  96 - 112 (mEq/L)   CO2 32  19 - 32 (mEq/L)   Glucose, Bld 155 (*) 70 - 99 (mg/dL)   BUN 15  6 - 23 (mg/dL)   Creatinine, Ser 1.61  0.50 - 1.35 (mg/dL)   Calcium 9.4  8.4 - 09.6 (mg/dL)   GFR calc non Af Amer >90  >90 (mL/min)   GFR calc Af Amer >90  >90 (mL/min)  CBC     Status: Abnormal   Collection Time   12/16/10  4:42 AM      Component Value Range   WBC 8.0  4.0 - 10.5 (K/uL)   RBC 3.18 (*) 4.22 - 5.81 (MIL/uL)   Hemoglobin 9.2 (*) 13.0 - 17.0 (g/dL)   HCT 04.5 (*) 40.9 - 52.0 (%)   MCV 89.3  78.0 - 100.0 (fL)   MCH 28.9  26.0 - 34.0 (pg)   MCHC 32.4  30.0 - 36.0 (g/dL)   RDW 81.1  91.4 - 78.2 (%)   Platelets 315  150 - 400 (K/uL)  DIFFERENTIAL     Status: Normal   Collection Time   12/16/10  4:42 AM      Component Value Range   Neutrophils Relative 65  43 - 77 (%)   Neutro Abs 5.2  1.7 - 7.7 (K/uL)   Lymphocytes Relative 23  12 - 46 (%)   Lymphs Abs 1.8  0.7 - 4.0 (K/uL)   Monocytes Relative 8  3 - 12 (%)   Monocytes Absolute 0.7  0.1 - 1.0 (K/uL)   Eosinophils Relative 4  0 - 5 (%)   Eosinophils Absolute 0.3  0.0 - 0.7 (K/uL)   Basophils Relative 0  0 - 1 (%)   Basophils Absolute 0.0  0.0 - 0.1 (K/uL)    Physical Exam       awake  alert Neck is supple without bruit. Distal pulses are felt. Cardiac exam atrial fibrillation rapid heart rate. Lungs clear to auscultation. Dependent extremities are edematous.Left knee is swollen but without pain or tenderness.  Neurological exam Awake alert and following commands well.Eye movements are full range with saccadic dysmetria horizontally. Blinks to threat bilaterally. Minimal left lower facial weakness. Moves all 4 extremities well against gravity but there is mild left-sided weakness. He is able to hold extremities up against gravity.   ASSESSMENT [name] with right hemispheric brain infarcts, secondary to atrial fibrillation, off antiplatelets/anticoagulats secondary to post ischemic stroke cerebral hemorrhage, GIB and now trach site hemorrhage. Trach replaced yesterday with no further bleeding. Nothing to offer from the GI standpoint. Will leave off ASA and lovenox.   Stroke risk factors:atrial fibrillation, diabetes mellitus, hyperlipidemia and CAD    Hospital day # 27  TREATMENT/PLAN Continue off ASA and lovenox. CCM monitoring in ICU. Eventual skilled nursing facility placement. Will follow.  RN to call NS for order to d/c scalp staples.  Joaquin Music, ANP-BC, GNP-BC [facility] Pager: [phone] 12/16/2010 8:38 AM  Dr. Delia Heady, Stroke Center Medical Director, has personally reviewed chart, pertinent data, examined the patient and developed the plan of care.

## 2010-12-16 NOTE — Progress Notes (Signed)
Subjective:      [name] is a 55 y.o. male former smoker admitted on 11/19/2010 with acute onset of slurred speech and Lt sided weakness. Dx Cerebellar ischemic cva then converted to bleed. Transferred to SDU 11/24 but returned to ICU 11/25 with decreased LOC which resolved with reinitiation of mechanical ventilation.     Overnight: No bleeding, low K , some HTN  Objective: Vital signs in last 24 hours: Temp:  [98.4 F (36.9 C)-98.8 F (37.1 C)] 98.5 F (36.9 C) (12/04 0727) Pulse Rate:  [43-114] 91  (12/04 0800) Resp:  [11-26] 26  (12/04 0800) BP: (125-210)/(91-140) 159/118 mmHg (12/04 0800) SpO2:  [98 %-100 %] 100 % (12/04 0800) FiO2 (%):  [35 %-40 %] 35 % (12/04 0800) Weight:  [98.5 kg (217 lb 2.5 oz)] 217 lb 2.5 oz (98.5 kg) (12/04 0300)  Exam- Gen- alert today. No distress.  Skin- no rash Nodes- none at neck, no bleeding  trach Neuro- Passive, not moving arms or legs voluntarily. Lungs- clear. No bleeding Heart- AFib  Abd- obese, soft,  PEG.  Lab Results:  Basename 12/16/10 0442 12/15/10 1100  WBC 8.0 11.0*  HGB 9.2* 9.9*  HCT 28.4* 30.2*  PLT 315 363   BMET  Basename 12/16/10 0442 12/15/10 1100  NA 144 146*  K 3.3* 2.8*  CL 105 105  CO2 32 32  GLUCOSE 155* 129*  BUN 15 16  CREATININE 0.75 0.72  CALCIUM 9.4 9.3    Studies/Results: Dg Chest Port 1 View  12/16/2010  *RADIOLOGY REPORT*  Clinical Data: Endotracheal tube/tracheostomy.  PORTABLE CHEST - 1 VIEW  Comparison: 12/15/2010.  Findings: Tracheostomy is present, and good position.  Low lung volumes are present.  Cardiomegaly.  Mild pulmonary edema and basilar atelectasis.  Pulmonary aeration has decreased compared to yesterday's exam.  Left upper extremity PICC is unchanged.  IMPRESSION:  1.  Support apparatus appears in good position, without change. 2.  Lower lung volumes with mild airspace disease, likely edema and basilar atelectasis.  Original Report Authenticated By: Andreas Newport, M.D.   Dg  Chest Port 1 View  12/15/2010  *RADIOLOGY REPORT*  Clinical Data: Tracheostomy tube replacement.  Hemoptysis.  Assess for debris in bronchi.  PORTABLE CHEST - 1 VIEW  Comparison: Chest radiograph performed 12/08/2010  Findings: The patient's tracheostomy tube is noted ending 4-5 cm above the carina.  The tracheobronchial tree is incompletely assessed but appears grossly unremarkable.  There is no evidence of distal opacification to suggest significant debris.  Patchy right basilar retrocardiac airspace opacity raises question for atelectasis versus aspiration.  The patient's left PICC is noted ending about the mid SVC.  No pleural effusion or pneumothorax is identified.  The cardiomediastinal silhouette is enlarged, more prominent than on the prior study, though this may reflect rotation.  No acute osseous abnormalities are seen.  IMPRESSION:  1.  Tracheostomy tube seen ending 4-5 cm above the carina. 2.  No evidence of distal collapse to suggest significant debris within the airways; the visualized portions of the tracheobronchial tree are grossly unremarkable. 3.  Right basilar airspace opacification raises question for atelectasis versus aspiration. 4.  Cardiomegaly noted.  Findings were discussed with Jonny Ruiz RN on [phone] at 06:46 a.m. on 12/15/2010.  Original Report Authenticated By: Tonia Ghent, M.D.    Medications: I reviewed meds  Assessment/Plan: 1) CVA- cerebellar ischemic infarct>hemorrhage. per stoke team, no changes 2) Acute resp failure- continues trach colar with new 8 shiley cuff x 24 hours - pcxr  may represent some asp blood on rt , will follow in am , but clinically appears well -continue trach collar, avoiding Vent when able and risk VAP Balloon will remain up x 2 more days then will deflate   3) Afib- rate controlled. Lovenox now off with bleeding on trach, scd, rate controlled 4)HBP- continue cardizem, still uncontrolled, will add oral labetolol 5) Hypernatremia,  hypokalemia-improved, replace K  Aggressive, recheck in am , check Mag in am 6) DM - controlled on tube feed well  Lab 12/16/10 0720 12/16/10 0005 12/15/10 1921 12/15/10 1530 12/15/10 1225  GLUCAP 127* 102* 156* 144* 117*     Move to sdu    LOS: 27 days   FEINSTEIN,DANIEL J. 12/16/2010, 8:52 AM

## 2010-12-16 NOTE — Plan of Care (Signed)
Problem: Phase I Progression Outcomes Goal: Voiding-avoid urinary catheter unless indicated Outcome: Completed/Met Date Met:  12/16/10 Voids, incontinent with condom catheter, no indwelling catheter in use

## 2010-12-16 NOTE — Progress Notes (Signed)
Patient transferred to 2608 by bed with two RNs. Patient on 30% trach collar, suctioned prior to transfer, and tolerated well. Admitted with three RNs at bedside. Vital signs stable.

## 2010-12-16 NOTE — Progress Notes (Signed)
eLink Physician-Brief Progress Note Patient Name: [name] DOB: March 09, 1955 MRN: [id number]  Date of Service  12/16/2010   HPI/Events of Note   Hypertension with BP of 171/127 - did not respond to 5 mg IV BB  eICU Interventions  Order for labetalol 10 mg IV q2 hours prn systolic BP greater than 170   Intervention Category Major Interventions: Electrolyte abnormality - evaluation and management Intermediate Interventions: Hypertension - evaluation and management Minor Interventions: Routine modifications to care plan (e.g. PRN medications for pain, fever)  Lanisa Ishler 12/16/2010, 4:17 AM

## 2010-12-16 NOTE — Progress Notes (Signed)
eLink Physician-Brief Progress Note Patient Name: [name] DOB: January 15, 1955 MRN: [id number]  Date of Service  12/16/2010   HPI/Events of Note   hypokalemia  eICU Interventions  Potassium replaced   Intervention Category Major Interventions: Electrolyte abnormality - evaluation and management Intermediate Interventions: Hypertension - evaluation and management Minor Interventions: Electrolytes abnormality - evaluation and management  [name] 12/16/2010, 5:32 AM

## 2010-12-16 NOTE — Progress Notes (Signed)
Gave PRN Lopressor for BP 200/130

## 2010-12-17 ENCOUNTER — Inpatient Hospital Stay (HOSPITAL_COMMUNITY): Payer: 59

## 2010-12-17 LAB — GLUCOSE, CAPILLARY
Glucose-Capillary: 119 mg/dL — ABNORMAL HIGH (ref 70–99)
Glucose-Capillary: 112 mg/dL — ABNORMAL HIGH (ref 70–99)
Glucose-Capillary: 159 mg/dL — ABNORMAL HIGH (ref 70–99)
Glucose-Capillary: 154 mg/dL — ABNORMAL HIGH (ref 70–99)

## 2010-12-17 LAB — DIFFERENTIAL
Basophils Absolute: 0 10*3/uL (ref 0.0–0.1)
Basophils Relative: 0 % (ref 0–1)
Neutro Abs: 4.1 10*3/uL (ref 1.7–7.7)
Neutrophils Relative %: 64 % (ref 43–77)

## 2010-12-17 LAB — CBC
MCHC: 32.2 g/dL (ref 30.0–36.0)
RDW: 14 % (ref 11.5–15.5)
WBC: 6.5 10*3/uL (ref 4.0–10.5)

## 2010-12-17 LAB — BASIC METABOLIC PANEL
CO2: 32 mEq/L (ref 19–32)
Calcium: 9.3 mg/dL (ref 8.4–10.5)
GFR calc non Af Amer: >90 mL/min (ref >90)
Potassium: 3.2 mEq/L — ABNORMAL LOW (ref 3.5–5.1)
Sodium: 145 mEq/L (ref 135–145)

## 2010-12-17 LAB — PHOSPHORUS: Phosphorus: 3.2 mg/dL (ref 2.3–4.6)

## 2010-12-17 LAB — MAGNESIUM: Magnesium: 2.1 mg/dL (ref 1.5–2.5)

## 2010-12-17 MED ORDER — POTASSIUM CHLORIDE 20 MEQ/15ML (10%) PO LIQD
40.0000 meq | Freq: Once | ORAL | Status: AC
  Administered 2010-12-17: 40 meq
  Filled 2010-12-17: qty 30

## 2010-12-17 NOTE — Progress Notes (Signed)
Stroke Team Progress Note  SUBJECTIVE [name] whose brother is at the bedside. He  feels symptoms are gradually improving. Brother concerned about pts afib. Feels he is "fibrillating worse". Per chart, afib stable. Unable to convince brother that increase in cardizem yest should continue to help and we will continue to monitor.  OBJECTIVE Most recent Vital Signs: Temp: 97.9 F (36.6 C) (12/05 0400) Temp src: Oral (12/05 0100) BP: 138/103 mmHg (12/05 0457) Pulse Rate: 79  (12/05 0457) Respiratory Rate: 15 O2 Saturdation: 100%  CBG (last 3)   Basename 12/17/10 0401 12/16/10 2047 12/16/10 1926  GLUCAP 142* 148* 173*   Intake/Output from previous day: 12/04 0701 - 12/05 0700 In: 1210 [I.V.:260; NG/GT:180; IV Piggyback:50] Out: 1016 [Urine:1015; Stool:1]  IV Fluid Intake     . sodium chloride 20 mL/hr at 12/16/10 1900  . feeding supplement (JEVITY 1.2) 1,000 mL (12/16/10 1132)   Diet    tube feedings  Activity  Up with assistance  DVT Prophylaxis  SCDs   Studies BASIC METABOLIC PANEL     Status: Abnormal   Collection Time   12/17/10  4:15 AM      Component Value Range   Sodium 145  135 - 145 (mEq/L)   Potassium 3.2 (*) 3.5 - 5.1 (mEq/L)   Chloride 104  96 - 112 (mEq/L)   CO2 32  19 - 32 (mEq/L)   Glucose, Bld 144 (*) 70 - 99 (mg/dL)   BUN 16  6 - 23 (mg/dL)   Creatinine, Ser 4.09  0.50 - 1.35 (mg/dL)   Calcium 9.3  8.4 - 81.1 (mg/dL)   GFR calc non Af Amer >90  >90 (mL/min)   GFR calc Af Amer >90  >90 (mL/min)  PHOSPHORUS     Status: Normal   Collection Time   12/17/10  4:15 AM      Component Value Range   Phosphorus 3.2  2.3 - 4.6 (mg/dL)  MAGNESIUM     Status: Normal   Collection Time   12/17/10  4:15 AM      Component Value Range   Magnesium 2.1  1.5 - 2.5 (mg/dL)  CBC     Status: Abnormal   Collection Time   12/17/10  4:15 AM      Component Value Range   WBC 6.5  4.0 - 10.5 (K/uL)   RBC 3.20 (*) 4.22 - 5.81 (MIL/uL)   Hemoglobin 9.2 (*) 13.0 - 17.0 (g/dL)   HCT 91.4 (*) 78.2 - 52.0 (%)   MCV 89.4  78.0 - 100.0 (fL)   MCH 28.8  26.0 - 34.0 (pg)   MCHC 32.2  30.0 - 36.0 (g/dL)   RDW 95.6  21.3 - 08.6 (%)   Platelets 294  150 - 400 (K/uL)  DIFFERENTIAL     Status: Normal   Collection Time   12/17/10  4:15 AM      Component Value Range   Neutrophils Relative 64  43 - 77 (%)   Neutro Abs 4.1  1.7 - 7.7 (K/uL)   Lymphocytes Relative 22  12 - 46 (%)   Lymphs Abs 1.5  0.7 - 4.0 (K/uL)   Monocytes Relative 10  3 - 12 (%)   Monocytes Absolute 0.7  0.1 - 1.0 (K/uL)   Eosinophils Relative 3  0 - 5 (%)   Eosinophils Absolute 0.2  0.0 - 0.7 (K/uL)   Basophils Relative 0  0 - 1 (%)  Basophils Absolute 0.0  0.0 - 0.1 (K/uL)     Dg Chest Port 1 View 12/17/2010 No significant change.   12/16/2010   1.  Support apparatus appears in good position, without change. 2.  Lower lung volumes with mild airspace disease, likely edema and basilar atelectasis.   Physical Exam  awake alert Neck is supple without bruit. Distal pulses are felt. Cardiac exam atrial fibrillation rapid heart rate. Lungs clear to auscultation. Dependent extremities are edematous.Left knee is swollen but without pain or tenderness.  Neurological exam Awake alert and following commands well.Eye movements are full range with saccadic dysmetria horizontally. Blinks to threat bilaterally. Minimal left lower facial weakness. Moves all 4 extremities well against gravity but there is mild left-sided weakness. He is able to hold extremities up against gravity.   ASSESSMENT [name] with right hemispheric brain infarcts, secondary to atrial fibrillation, off antiplatelets/anticoagulats secondary to post ischemic stroke cerebral hemorrhage, GIBleed and trach hemorrhage. Will leave off ASA and lovenox.   Afib rate remains elevated. cardizem increased yest. Will continue to follow.  Stroke risk factors:atrial fibrillation, diabetes  mellitus, hyperlipidemia and CAD   Hospital day # 28  TREATMENT/PLAN ST may use passy muir valve to enable patient to speak.. Monitor atrial fibrillation. SW for ongoing skilled nursing facility bed search. D/W brother at bedside and answered questions. Joaquin Music, ANP-BC, GNP-BC [facility] Pager: [phone] 12/17/2010 8:23 AM  Dr. Delia Heady, Stroke Center Medical Director, has personally reviewed chart, pertinent data, examined the patient and developed the plan of care.

## 2010-12-17 NOTE — Progress Notes (Signed)
Physical Therapy Treatment Patient Details Name: [name] MRN: [id number] DOB: 07-16-1955 Today's Date: 12/17/2010  PT Assessment/Plan  PT - Assessment/Plan Comments on Treatment Session: pt has made significant functional improvements.  moves in synergy, but showing more isolated movement in each arm  and leg.  Trunk remains weak and uncoordinated, but again much improved. PT Plan: Discharge plan needs to be updated Follow Up Recommendations: Inpatient Rehab;Other (comment) (believe this pt would ba a great rehab candidate now.) Equipment Recommended: Defer to next venue PT Goals  Acute Rehab PT Goals PT Goal: Rolling Supine to Right Side - Progress: Progressing toward goal PT Goal: Rolling Supine to Left Side - Progress: Progressing toward goal PT Goal: Supine/Side to Sit - Progress: Progressing toward goal PT Goal: Sit at Edge Of Bed - Progress: Met (needs to work on consistency) PT Goal: Sit to Supine/Side - Progress: Other (comment) (not addressed today) Pt will go Sit to Stand: with mod assist PT Goal: Sit to Stand - Progress: Progressing toward goal Pt will go Stand to Sit: with mod assist PT Goal: Stand to Sit - Progress: Progressing toward goal PT Transfer Goal: Bed to Chair/Chair to Bed - Progress: Progressing toward goal  PT Treatment Precautions/Restrictions  Precautions Precautions: Fall Precaution Comments: Spoke with RN who notes ok for Therapy to see pt.  Only perfomed bed level activity secondary to elevated BP.   Required Braces or Orthoses: No Restrictions Weight Bearing Restrictions: No Mobility (including Balance) Bed Mobility Bed Mobility: Yes Rolling Right: 1: +2 Total assist;Patient percentage (comment);Other (comment);With rail (pt=40%) Rolling Right Details (indicate cue type and reason): Hand over hand to facilitation to complete roll with upper extremity reach Rolling Left: 1: +2 Total assist;Patient percentage (comment);With rail;Other (comment)  (pt=40%) Rolling Left Details (indicate cue type and reason): vc/tc's for normalized movement (LE's flexed and moved to build momentum, RUE reaching L Right Sidelying to Sit: 1: +2 Total assist;Patient percentage (comment);Other (comment) (pt=40%) Right Sidelying to Sit Details (indicate cue type and reason): vc's for hand placement; manual A for truncal control Sitting - Scoot to Edge of Bed:  (pt=40%) Transfers Transfers: Yes Sit to Stand: 1: +2 Total assist;Patient percentage (comment);With upper extremity assist;From bed;Other (comment) (pt =30%) Sit to Stand Details (indicate cue type and reason): vc for hand placement; manual A for forward w/shift; tc's for upright positioning  Bil knees blocked, more support needed right than left Stand to Sit: 1: +2 Total assist;To bed;Patient percentage (comment) (pt =40%) Squat Pivot Transfers: 1: +2 Total assist;Patient percentage (comment);With upper extremity assistance;Other (comment) (assisted pivotal steps) Squat Pivot Transfer Details (indicate cue type and reason): manual A for forward and side to side w/shift; vc's for hand placement and technique Ambulation/Gait Ambulation/Gait: No  Posture/Postural Control Posture/Postural Control: Postural limitations Postural Limitations: trunk slow to respond and works in synergy with over or under compensation, but much improved Balance Balance Assessed: Yes Static Sitting Balance Static Sitting - Balance Support: Bilateral upper extremity supported;Feet supported (minimal UE support) Static Sitting - Level of Assistance: 4: Min assist (at best min guard and worst mod A) Static Sitting - Comment/# of Minutes: 10 (or more) Dynamic Sitting Balance Dynamic Sitting - Balance Support: Right upper extremity supported;Left upper extremity supported;Feet supported Dynamic Sitting - Level of Assistance: 3: Mod assist Dynamic Sitting Balance - Compensations: support the trunk during rotationor reaching tasks,  cervical rotation R Exercise    End of Session PT - End of Session Activity Tolerance: Patient tolerated treatment well Patient  left: in chair;with call bell in reach;with family/visitor present Nurse Communication: Mobility status for transfers General Behavior During Session: [facility] for tasks performed Cognition: [facility] for tasks performed Cognitive Impairment: pt follows commands approx. 50% of the time  MottingerEliseo Gum 12/17/2010, 4:22 PM  12/17/2010   [place], PT [phone] (pager)

## 2010-12-17 NOTE — Progress Notes (Signed)
Speech Pathology  PMSV (Passy-Muir Speaking Valve) Evaluation  I. HPI:   [name] is a 55 y.o. male former smoker admitted on 11/19/2010 with acute onset of slurred speech and Lt sided weakness. Dx Cerebellar ischemic cva then converted to bleed. Intubated on arrival 11/7, extubated 11/9 but er-intubated 11/10 secondary to continued VDRF. S/p tracheostomy 11/16. Transferred to SDU 11/24 but returned to ICU 11/25 with decreased LOC which resolved with reinitiation of mechanical ventilation. Bleeding from trach site noted on 12/3 which is now controlled. Patient has been tolerating trach collar x24 hours. PMSV evaluation ordered to facilitate oral communication.   II. Tracheostomy Tube  Initial trach Placement date:11/28/10  Trach collar period: x24 hours Type: #8.0 cuffed shiley      Size: #8.0     FiO2: 5 liters Cuff: yes Fenestration:  no Secretion description: minimal, clear Frequency of tracheal suctioning:minimal per RN per daily Level of secretion expectoration: none observed during today's evaluation  II. Ventilator Dependency  no Weaning Trials:  n/a Vent Mode:        Nocturnal Vent: no Vt:     Rate:  PEEP:    PS:   FiO2:   IV. Cuff Deflation Trials  Cuff deflated prior to arrival to room. Per RN, has been deflated since this AM without difficulty.   V. PMSV Trial PMSV was placed for 20 minutes. Able to redirect subglottic air through upper airway: yes Able to attain phonation with PMSV: yes Able to expectorate secretions with PMSV: not observed  Breath Support for phonation: decreased. Patient able to phonate approximately 3 words/breath Intelligibiity: 50%, requires repetition of information, suspect secondary to a combination of lethargy, dysarthria, and decreased breath support RR: 11-17 SpO2: 99-100% HR:91-102  Behavior: calm but lethargic. Willing to partcipate  VI. Clinical Impression:  PMSV placed by clinician. Patient able to tolerate PMSV for 5 minute intervals  without change in vital signs (all remained at baseline and WFL),evidence of WOB/distress and audible but low intensity vocal quality,  however with consistent CO2 trapping as noted by burst of air from trach hub upon valve removal. Clinician repositioned patient, cued patient to clear minimal supraglottic secretions and re-attempted PMSV trials without change in tolerance. Question large size of trach vs edema given recent tracheal bleeding as cause of decreased upper airway patency. SLP will continue to f/u  Therapy Diagnosis:  Voice disorder  VII. Recommendations MD- Consider changing tracheostomy tube to a  #6.0 when appropriate to facilitate improved upper airway patency.  1. Patient to use PMSV with SLP only!!! 2. 3.  VIII. Treatment Plan  yes Frequency/Duration: 3x/week for 2 weeks  Short Term Goals 1. Patient will tolerate PMSV for 15 minutes without evidence of CO2 trapping or distress and with stable vital signs with moderate clinician assist.  Pain: no  Ferdinand Lango MA, CCC-SLP [phone]

## 2010-12-17 NOTE — Progress Notes (Signed)
Pt.with improved functional status and participation in therapy, updated D/C plan to Inpatient Rehab. MD please write consult if you agree.  Will continue to follow acutely. Thanks!  Cassandria Anger, OTR/L Pager: [phone] 12/17/2010 .

## 2010-12-17 NOTE — Progress Notes (Signed)
Subjective:      [name] is a 55 y.o. male former smoker admitted on 11/19/2010 with acute onset of slurred speech and Lt sided weakness. Dx Cerebellar ischemic cva then converted to bleed. Transferred to SDU 11/24 but returned to ICU 11/25 with decreased LOC which resolved with reinitiation of mechanical ventilation.     Overnight: No bleeding, low K , some HTN  Objective: Vital signs in last 24 hours: Temp:  [97.9 F (36.6 C)-99.4 F (37.4 C)] 98.4 F (36.9 C) (12/05 1242) Pulse Rate:  [55-115] 110  (12/05 1301) Resp:  [10-28] 25  (12/05 1301) BP: (106-189)/(66-126) 169/103 mmHg (12/05 1242) SpO2:  [96 %-100 %] 98 % (12/05 1301) FiO2 (%):  [28 %-30 %] 28 % (12/05 1301) Weight:  [91.6 kg (201 lb 15.1 oz)-92 kg (202 lb 13.2 oz)] 201 lb 15.1 oz (91.6 kg) (12/05 0500)  Exam- Gen- alert today. No distress.  Skin- no rash Nodes- none at neck, no bleeding  trach Neuro- Passive, not moving arms or legs voluntarily. Lungs- clear. No bleeding Heart- AFib  Abd- obese, soft,  PEG.  Lab Results:  [facility] 12/17/10 0415 12/16/10 0442  WBC 6.5 8.0  HGB 9.2* 9.2*  HCT 28.6* 28.4*  PLT 294 315   BMET  Basename 12/17/10 0415 12/16/10 0442  NA 145 144  K 3.2* 3.3*  CL 104 105  CO2 32 32  GLUCOSE 144* 155*  BUN 16 15  CREATININE 0.77 0.75  CALCIUM 9.3 9.4    Studies/Results: Dg Chest Port 1 View  12/17/2010  *RADIOLOGY REPORT*  Clinical Data: Check tracheostomy tube position.  PORTABLE CHEST - 1 VIEW  Comparison: 12/16/2010.  Findings: Tracheostomy tube tip midline.  Left PICC line tip mid superior vena cava level.  Cardiomegaly.  Central pulmonary vascular prominence.  Hazy parenchymal changes lung bases may represent atelectasis greater on the left.  Infiltrate not excluded.  Appearance without change.  IMPRESSION: No significant change.  Original Report Authenticated By: Fuller Canada, M.D.   Dg Chest Port 1 View  12/16/2010  *RADIOLOGY REPORT*  Clinical Data:  Endotracheal tube/tracheostomy.  PORTABLE CHEST - 1 VIEW  Comparison: 12/15/2010.  Findings: Tracheostomy is present, and good position.  Low lung volumes are present.  Cardiomegaly.  Mild pulmonary edema and basilar atelectasis.  Pulmonary aeration has decreased compared to yesterday's exam.  Left upper extremity PICC is unchanged.  IMPRESSION:  1.  Support apparatus appears in good position, without change. 2.  Lower lung volumes with mild airspace disease, likely edema and basilar atelectasis.  Original Report Authenticated By: Andreas Newport, M.D.    Medications: I reviewed meds  Assessment/Plan: 1) CVA- cerebellar ischemic infarct>hemorrhage. per stoke team, no changes 2) Acute resp failure- continues trach colar with new 8 shiley cuff x 24 hours - pCXR may represent some asp blood on rt , will follow in am , but clinically appears well - Continue trach collar, avoiding Vent when able and risk VAP - Balloon will remain up x 1 more day then will deflate 3) Afib- rate controlled. Lovenox now off with bleeding on trach, scd, rate controlled 4)HBP- continue cardizem, still uncontrolled, will add oral labetolol 5) Hypernatremia, hypokalemia-improved, replace K  Aggressive, recheck in am , check Mag in am 6) DM - controlled on tube feed well  Lab 12/17/10 1241 12/17/10 0912 12/17/10 0401 12/16/10 2047 12/16/10 1926  GLUCAP 159* 112* 142* 148* 173*     Move to sdu    LOS: 28 days  Ivan Maskell 12/17/2010, 1:35 PM

## 2010-12-17 NOTE — Progress Notes (Signed)
eLink Physician-Brief Progress Note Patient Name: [name] DOB: April 29, 1955 MRN: [id number]  Date of Service  12/17/2010   HPI/Events of Note   hypokalemia  eICU Interventions  Potassium replaced   Intervention Category Major Interventions: Electrolyte abnormality - evaluation and management Intermediate Interventions: Hypertension - evaluation and management Minor Interventions: Electrolytes abnormality - evaluation and management  Thailan Sava 12/17/2010, 5:39 AM

## 2010-12-17 NOTE — Progress Notes (Signed)
CSW was contacted by [facility] attorney, requesting information as to pt condition as pt brothers are pursuing gaurdianship. CSW provided requested information and will place copy of attorney recommendations in shadow chart.  CSW continues to work to secure bed at [facility] for pt.  Baxter Flattery, MSW [phone]

## 2010-12-17 NOTE — Plan of Care (Signed)
Problem: Phase II Progression Outcomes Goal: Other Phase II Outcomes/Goals Outcome: Completed/Met Date Met:  12/17/10 PMSV evaluation complete by SLP.

## 2010-12-17 NOTE — Progress Notes (Signed)
Occupational Therapy Treatment Patient Details Name: [name] MRN: [id number] DOB: [birth year] Today's Date: 12/17/2010  OT Assessment/Plan OT Assessment/Plan Comments on Treatment Session: Pt. reports no blurry vision today, inconsistent with visual status and will further assess when pt's family bring in glasses. OT Plan: Discharge plan needs to be updated OT Frequency: Min 2X/week Follow Up Recommendations: Inpatient Rehab Equipment Recommended: Defer to next venue OT Goals Acute Rehab OT Goals OT Goal Formulation: With patient Time For Goal Achievement: 2 weeks ADL Goals Pt Will Perform Grooming: Supported;with cueing (comment type and amount);Sitting, chair;with set-up ADL Goal: Grooming - Progress: Progressing toward goals Pt Will Perform Upper Body Bathing: with min assist;Sitting, edge of bed ADL Goal: Upper Body Bathing - Progress: Not addressed Pt Will Perform Upper Body Dressing: with min assist;Sitting, bed ADL Goal: Upper Body Dressing - Progress: Not addressed Pt Will Transfer to Toilet: with 2+ total assist;Stand pivot transfer;3-in-1 (pt=50%) ADL Goal: Toilet Transfer - Progress: Not addressed Additional ADL Goal #1: Pt. will roll right and left with total assist +2 pt=30% to increase participation with ADLs. Additional ADL Goal #2: Pt. will sit EOB ~31mins with mod assist trunk support in preparation for ADLs  OT Treatment Precautions/Restrictions  Precautions Precautions: Fall Required Braces or Orthoses: No Restrictions Weight Bearing Restrictions: No   ADL ADL Eating/Feeding: NPO Grooming: Performed;Wash/dry face;Moderate assistance Grooming Details (indicate cue type and reason): With use of Rt UE and hand over hand facilitation to maintain grasp of wash cloth and for elbow support to complete task due to decreased coordination and strength. Where Assessed - Grooming: Sitting, bed;Supported (min assist support due to decreased trunk strength) Toilet  Transfer: Simulated;+2 Total assistance;Comment for patient % (pt=40%) Toilet Transfer Details (indicate cue type and reason): Max verbal and hand over hand cues for hand placement and transfer technique Toilet Transfer Method: Stand pivot Toilet Transfer Equipment: Other (comment) Nurse, children's) Toileting - Hygiene: Performed;+1 Total assistance Toileting - Hygiene Details (indicate cue type and reason): Pt. incontinent of bowels upon arrival and with rolling Where Assessed - Toileting Hygiene: Rolling right and/or left ADL Comments: Pt. very motivated for OOB activity today and with increased participation. pt. completed dynamice sitting balance activities with min trunk support throughout ~37mins. Pt. performed anterior/posterior weightshifting and reciprocal scooting and bilateral upper extremity reaching across midline for objects. Pt. provided with min assist under right elbow to achieve shoulder level reach forward to touch therapist hand crossing midline. Pt. with decreased control of rt UE and educated on fine motor activites to complete throughout the day to increase functional use of the upper extremity to bring each fingertip to thumb. Pt. unable to turn head past midline to right side without increased pain in neck due to positional muscle pain. Educated pt. on bringing left ear to left shoulder to facilitate stretch and hold for 20 seconds. Pt's family to bring in glasses so vision can be fully assessed, still noting right eye beating nystagmus horizontally. Mobility  Bed Mobility Bed Mobility: Yes Rolling Right: 1: +2 Total assist;Patient percentage (comment);Other (comment);With rail (pt=40%) Rolling Right Details (indicate cue type and reason): Hand over hand to facilitation to complete roll with upper extremity reach Right Sidelying to Sit: 1: +2 Total assist;Patient percentage (comment);HOB flat (pt=40%) Sitting - Scoot to Edge of Bed: 1: +2 Total assist;Other (comment);Patient  percentage (comment) (pt=40%) Transfers Transfers: Yes Sit to Stand: 1: +2 Total assist;Patient percentage (comment);From bed;With upper extremity assist (pt=30%) Sit to Stand Details (indicate cue type and reason): Max  manual facilitation at hips to elicit upright position and tactile cues at chest to promote upright position. bilateral knees blocked to prevent buckling.     End of Session OT - End of Session Equipment Utilized During Treatment: Gait belt Activity Tolerance: Patient tolerated treatment well Patient left: with call bell in reach;in chair Nurse Communication: Mobility status for transfers;Need for lift equipment General Behavior During Session: [facility] for tasks performed Cognition: Impaired Cognitive Impairment: Pt. with flat affect, follows commands ~50% of time consistently   Co-treat with P.T  Banita Lehn, OTR/L Pager [phone]  12/17/2010, 3:26 PM

## 2010-12-17 NOTE — Progress Notes (Signed)
Notified MD patient had 5 beat run v tach, asymptomatic, BP 154/88, heart rhythm otherwise A-fib 80's -low 100's in rate. Continuing to monitor patient.

## 2010-12-18 DIAGNOSIS — I6330 Cerebral infarction due to thrombosis of unspecified cerebral artery: Secondary | ICD-10-CM

## 2010-12-18 DIAGNOSIS — I629 Nontraumatic intracranial hemorrhage, unspecified: Secondary | ICD-10-CM

## 2010-12-18 DIAGNOSIS — I69991 Dysphagia following unspecified cerebrovascular disease: Secondary | ICD-10-CM

## 2010-12-18 DIAGNOSIS — G8110 Spastic hemiplegia affecting unspecified side: Secondary | ICD-10-CM

## 2010-12-18 DIAGNOSIS — I619 Nontraumatic intracerebral hemorrhage, unspecified: Secondary | ICD-10-CM

## 2010-12-18 DIAGNOSIS — J9600 Acute respiratory failure, unspecified whether with hypoxia or hypercapnia: Secondary | ICD-10-CM

## 2010-12-18 DIAGNOSIS — Z9911 Dependence on respirator [ventilator] status: Secondary | ICD-10-CM

## 2010-12-18 DIAGNOSIS — Z930 Tracheostomy status: Secondary | ICD-10-CM

## 2010-12-18 LAB — GLUCOSE, CAPILLARY
Glucose-Capillary: 121 mg/dL — ABNORMAL HIGH (ref 70–99)
Glucose-Capillary: 82 mg/dL (ref 70–99)
Glucose-Capillary: 135 mg/dL — ABNORMAL HIGH (ref 70–99)

## 2010-12-18 MED ORDER — CARVEDILOL 12.5 MG PO TABS
12.5000 mg | ORAL_TABLET | Freq: Two times a day (BID) | ORAL | Status: DC
  Filled 2010-12-18 (×2): qty 1

## 2010-12-18 MED ORDER — RAMIPRIL 5 MG PO CAPS
5.0000 mg | ORAL_CAPSULE | Freq: Two times a day (BID) | ORAL | Status: DC
  Administered 2010-12-18 – 2010-12-19 (×2): 5 mg
  Filled 2010-12-18 (×3): qty 1

## 2010-12-18 NOTE — Progress Notes (Addendum)
Subjective:      [name] is a 55 y.o. male former smoker admitted on 11/19/2010 with acute onset of slurred speech and Lt sided weakness. Dx Cerebellar ischemic cva then converted to bleed. Transferred to SDU 11/24 but returned to ICU 11/25 with decreased LOC which resolved with reinitiation of mechanical ventilation.     Overnight: RN reports jelly-like stools, distended abdomen.  No bleeding from trach.  No acute events.  Family asking about afib and treatment (previously followed by Dr. [name])  Objective: Vital signs in last 24 hours: Temp:  [97.9 F (36.6 C)-99.1 F (37.3 C)] 97.9 F (36.6 C) (12/06 0757) Pulse Rate:  [41-115] 92  (12/06 1011) Resp:  [11-28] 16  (12/06 0814) BP: (117-172)/(82-138) 145/104 mmHg (12/06 1011) SpO2:  [97 %-100 %] 100 % (12/06 0800) FiO2 (%):  [28 %] 28 % (12/06 0814) Weight:  [204 lb 9.4 oz (92.8 kg)] 204 lb 9.4 oz (92.8 kg) (12/06 0124)  Exam- Gen- drowsy today. No distress.  Skin- no rash Neck- #8 trach midline, c/d/i Neuro- Passive, not moving arms or legs voluntarily. Lungs- clear. No bleeding Heart- AFib, distant tones Abd- obese, distened, decreased bowel sounds  PEG.  Lab Results:  [name] 12/17/10 0415 12/16/10 0442  WBC 6.5 8.0  HGB 9.2* 9.2*  HCT 28.6* 28.4*  PLT 294 315   BMET  Basename 12/17/10 0415 12/16/10 0442  NA 145 144  K 3.2* 3.3*  CL 104 105  CO2 32 32  GLUCOSE 144* 155*  BUN 16 15  CREATININE 0.77 0.75  CALCIUM 9.3 9.4    Studies/Results: Dg Chest Port 1 View  12/17/2010  *RADIOLOGY REPORT*  Clinical Data: Check tracheostomy tube position.  PORTABLE CHEST - 1 VIEW  Comparison: 12/16/2010.  Findings: Tracheostomy tube tip midline.  Left PICC line tip mid superior vena cava level.  Cardiomegaly.  Central pulmonary vascular prominence.  Hazy parenchymal changes lung bases may represent atelectasis greater on the left.  Infiltrate not excluded.  Appearance without change.  IMPRESSION: No significant  change.  Original Report Authenticated By: Fuller Canada, M.D.    Medications: Medications reviewed.   Assessment/Plan: 1) CVA- cerebellar ischemic infarct>hemorrhage. per stoke team, no changes  2) Acute resp failure- continues trach colar with new 8 shiley cuff x 24 hours - pCXR may represent some asp blood on rt , will follow in am , but clinically appears well - Continue trach collar, avoiding Vent when able and risk VAP - Continue efforts with ST, PMV use -consider downsize am 12/7 if no further bleeding  3) Afib- rate controlled. Lovenox now off with bleeding at trach, scd, rate controlled.   -consult Dr. [name] for further input   4)HBP- continue cardizem, still uncontrolled -range from 133-180 systolic -continue oral labetolol  5) Hypernatremia, hypokalemia-improved,  -follow PRN  6) DM - controlled on tube feed well  Lab 12/18/10 0757 12/18/10 0354 12/18/10 0122 12/17/10 2020 12/17/10 1655  GLUCAP 82 133* 121* 154* 123*    7) Disposition - family would like CIR and then hopeful for transition to home.  Brother ok for patient to go home with him after discharge.     LOS: 29 days   Canary Brim, NP-C [facility] Pgr: [phone]   Patient seen and examined, agree with above note.  I dictated the care and orders written for this patient under my direction.  Koren Bound, M.D.

## 2010-12-18 NOTE — Consult Note (Signed)
Physical Medicine and Rehabilitation Consult Reason for Consult:Slurred speech and left sided weakness Referring Phsyician: Dr. Pearlean Brownie    HPI: [name] is an 55 y.o. male with H/O COPD, DM, A fib, admitted 11/19/2010 with acute onset of slurred speech and left sided weakness.  CT head without acute abnormality.  Patient became diaphoretic with increased WOB with inability to speak, LUE flaccidity while in Xray. He was intubated for airway support.  CT angio chest without PE and evidence of CHF.  CTA head and neck without thrombus, distal left vertebral artery stenosis and basilar artery stenosis. MRI brain with acute subacute infarct right SCA territory including mid brain and upper pons.   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  Dr Phoebe Perch consulted and recommended reversing coumadin and heparin.  On 11/10 patient, had ventriculostomy placed with good drainage of CSF. Patient comatose and unresponsive.  Patient trached by Dr Pollyann Kennedy on 11/16 and PEG placed 11/21 by Dr. Juanda Chance. Developed fevers and diarrhea, started on flagyl for ? C diff.  Ventilator wean initiated  On, 11/24 with worsening of MS and repeat CT head with slight increase in hemorrhage and ?new right parietal infarct and patient reintubated.  Dr Phoebe Perch consulted and no surgical intervention recommended.  Vent wean initiated 11/28 and patient tolerated extubation.  Noted to have blood in stools with ?lower GIB.  GI recommended serial hemoglobin checks.  Acute stomal bleeding on12/3 treated with change and placement of #8 cuffed trach. Blood pressures remain labile and A Fib treated with IV Cardizem.  PMVS trials initiated yesterday. Therapies ongoing and patient showing improvement in participation and endurance. Working on pre gait activities. MD, PT, OT  recommending CIR.  Review of Systems  and  Unable to perform ROS: patient nonverbal  Eyes: Positive for double vision (Visual deficits reported.).  Respiratory: Positive for sputum production (bloody streaks noted.  Copious amount suctioned.).   Cardiovascular: Negative.   Gastrointestinal: Negative for abdominal pain.  Genitourinary:       Incontinent.  Neurological: Positive for weakness.       Right > LUE weakness.  Delayed processing.   Noted to have wet voice with PMV attempts. BLE with ataxia.   Past Medical History  Diagnosis Date  . Diabetes mellitus   . Coronary artery disease   . Hypertension   . Gout   . CHF (congestive heart failure)   . Afib   . Arthritis    Past Surgical History  Procedure Date  . Tracheostomy tube placement 11/28/2010    Procedure: TRACHEOSTOMY;  Surgeon: Susy Frizzle, MD;  Location: [facility] OR;  Service: ENT;  Laterality: N/A;  . Peg placement 12/03/2010    Procedure: PERCUTANEOUS ENDOSCOPIC GASTROSTOMY (PEG) PLACEMENT;  Surgeon: Hart Carwin, MD;  Location: [facility] ENDOSCOPY;  Service: Endoscopy;  Laterality: N/A;   History reviewed. No pertinent family history.  Social History:  Single.  Retired from [employer] 5 years ago.  He reports that he quit smoking about 5 years ago. His smoking use included Cigarettes. He does not have any smokeless tobacco history on file. He reports that he drinks about 6.6 ounces of alcohol per week. He reports that he does not use illicit drugs.  Has a brother in [place] who's retired.  Maybe able to assist?    No Known Allergies  Prior to Admission medications  Medication Sig Start Date End Date Taking? Authorizing Provider  allopurinol (ZYLOPRIM) 300 MG tablet Take 300 mg by mouth daily.     Yes Historical Provider, MD  amLODipine (NORVASC) 5 MG tablet Take 5 mg by mouth daily.     Yes Historical Provider, MD  AMOXICILLIN PO Take 400 mg by mouth 2 (two) times daily.    Yes Historical Provider, MD  aspirin  81 MG tablet Take 81 mg by mouth daily.     Yes Historical Provider, MD  carvedilol (COREG) 25 MG tablet Take 25 mg by mouth 2 (two) times daily with a meal.     Yes Historical Provider, MD  celecoxib (CELEBREX) 200 MG capsule Take 200 mg by mouth 2 (two) times daily.     Yes Historical Provider, MD  cloNIDine (CATAPRES) 0.1 MG tablet Take 0.1 mg by mouth 2 (two) times daily.     Yes Historical Provider, MD  colchicine 0.6 MG tablet Take 0.6 mg by mouth daily. For gout    Yes Historical Provider, MD  cyclobenzaprine (FLEXERIL) 10 MG tablet Take 5-10 mg by mouth 2 (two) times daily as needed. For muscle spasms    Yes Historical Provider, MD  furosemide (LASIX) 20 MG tablet Take 10-20 mg by mouth 2 (two) times daily. For pressure and fluid    Yes Historical Provider, MD  isosorbide-hydrALAZINE (BIDIL) 20-37.5 MG per tablet Take 1 tablet by mouth 3 (three) times daily.     Yes Historical Provider, MD  metFORMIN (GLUCOPHAGE-XR) 500 MG 24 hr tablet Take 1,000 mg by mouth 2 (two) times daily.     Yes Historical Provider, MD  Multiple Vitamins-Minerals (MULTIVITAMINS THER. W/MINERALS) TABS Take 1 tablet by mouth daily.     Yes Historical Provider, MD  naproxen sodium (ANAPROX) 220 MG tablet Take 220 mg by mouth 2 (two) times daily as needed. For pain    Yes Historical Provider, MD  ramipril (ALTACE) 10 MG capsule Take 10 mg by mouth 2 (two) times daily.     Yes Historical Provider, MD  spironolactone (ALDACTONE) 50 MG tablet Take 50 mg by mouth 2 (two) times daily.     Yes Historical Provider, MD  warfarin (COUMADIN) 7.5 MG tablet Take 7.5 mg by mouth daily.     Yes Historical Provider, MD   Scheduled Medications:    . antiseptic oral rinse  15 mL Mouth Rinse QID  . chlorhexidine  15 mL Mouth Rinse BID  . diltiazem  90 mg Per Tube Q6H  . insulin aspart  0-20 Units Subcutaneous Q4H  . insulin aspart  4 Units Subcutaneous Q4H  . insulin glargine  20 Units Subcutaneous BID  . isosorbide-hydrALAZINE  1  tablet Oral TID  . labetalol  100 mg Oral BID  . pantoprazole sodium  40 mg Per Tube Q1200  . rosuvastatin  5 mg Oral q1800  . sodium chloride  10 mL Intracatheter Q12H   PRN MED's: acetaminophen (TYLENOL) oral liquid 160 mg/5 mL, fentaNYL, labetalol, metoprolol  Home: Home Living Lives With: Alone Type of Home: House Home Layout: One level Home Access: Stairs to enter Entrance Stairs-Rails: Lawyer of Steps: 3-4 Bathroom Toilet: Standard Home Adaptive Equipment: Crutches  Functional History: Prior Function Level of Independence: Independent with basic ADLs;Independent with homemaking with ambulation;Independent with gait Driving: Yes Functional Status:  Mobility: Bed Mobility Bed Mobility: Yes Rolling Right: 1: +2 Total assist;Patient percentage (comment);Other (comment);With rail (pt=40%) Rolling Right Details (indicate cue type and reason): Hand  over hand to facilitation to complete roll with upper extremity reach Rolling Left: 1: +2 Total assist;Patient percentage (comment);With rail;Other (comment) (pt=40%) Rolling Left Details (indicate cue type and reason): vc/tc's for normalized movement (LE's flexed and moved to build momentum, RUE reaching L Right Sidelying to Sit: 1: +2 Total assist;Patient percentage (comment);Other (comment) (pt=40%) Right Sidelying to Sit Details (indicate cue type and reason): vc's for hand placement; manual A for truncal control Sitting - Scoot to Edge of Bed:  (pt=40%) Transfers Transfers: Yes Sit to Stand: 1: +2 Total assist;Patient percentage (comment);With upper extremity assist;From bed;Other (comment) (pt =30%) Sit to Stand Details (indicate cue type and reason): vc for hand placement; manual A for forward w/shift; tc's for upright positioning  Bil knees blocked, more support needed right than left Stand to Sit: 1: +2 Total assist;To bed;Patient percentage (comment) (pt =40%) Squat Pivot Transfers: 1: +2 Total  assist;Patient percentage (comment);With upper extremity assistance;Other (comment) (assisted pivotal steps) Squat Pivot Transfer Details (indicate cue type and reason): manual A for forward and side to side w/shift; vc's for hand placement and technique Ambulation/Gait Ambulation/Gait: No    ADL: ADL Eating/Feeding: NPO Grooming: Performed;Wash/dry face;Moderate assistance Grooming Details (indicate cue type and reason): With use of Rt UE and hand over hand facilitation to maintain grasp of wash cloth and for elbow support to complete task due to decreased coordination and strength. Where Assessed - Grooming: Sitting, bed;Supported (min assist support due to decreased trunk strength) Upper Body Bathing: Simulated;Chest;Right arm;Left arm;Abdomen;+1 Total assistance Where Assessed - Upper Body Bathing: Supine, head of bed up Lower Body Bathing: Simulated;+1 Total assistance Where Assessed - Lower Body Bathing: Supine, head of bed up Upper Body Dressing: Performed;Maximal assistance Upper Body Dressing Details (indicate cue type and reason): With donning gown and mod verbal cues for inititation and completiong of task Where Assessed - Upper Body Dressing: Supine, head of bed up Lower Body Dressing: +1 Total assistance;Performed Lower Body Dressing Details (indicate cue type and reason): Pt. unable to don/doff socks due to decreased functional use of bilateral UE and trunk weakness Where Assessed - Lower Body Dressing: Sit to stand from bed Toilet Transfer: Simulated;+2 Total assistance;Comment for patient % (pt=40%) Toilet Transfer Details (indicate cue type and reason): Max verbal and hand over hand cues for hand placement and transfer technique Toilet Transfer Method: Stand pivot Toilet Transfer Equipment: Other (comment) Nurse, children's) Toileting - Clothing Manipulation: Not assessed Toileting - Hygiene: Performed;+1 Total assistance Toileting - Hygiene Details (indicate cue type and  reason): Pt. incontinent of bowels upon arrival and with rolling Where Assessed - Toileting Hygiene: Rolling right and/or left Tub/Shower Transfer: Not assessed ADL Comments: Pt. very motivated for OOB activity today and with increased participation. pt. completed dynamice sitting balance activities with min trunk support throughout ~44mins. Pt. performed anterior/posterior weightshifting and reciprocal scooting and bilateral upper extremity reaching across midline for objects. Pt. provided with min assist under right elbow to achieve shoulder level reach forward to touch therapist hand crossing midline. Pt. with decreased control of rt UE and educated on fine motor activites to complete throughout the day to increase functional use of the upper extremity to bring each fingertip to thumb. Pt. unable to turn head past midline to right side without increased pain in neck due to positional muscle pain. Educated pt. on bringing left ear to left shoulder to facilitate stretch and hold for 20 seconds. Pt's family to bring in glasses so vision can be fully assessed, still noting right eye beating  nystagmus horizontally.  Cognition: Cognition Arousal/Alertness: Lethargic Orientation Level: Oriented to person Cognition Arousal/Alertness: Lethargic Overall Cognitive Status: Difficult to assess (Due to pt. with decreased verbalization and arousal) Difficult to assess due to: impaired communication;tracheostomy Orientation Level: Oriented to person  Blood pressure 145/104, pulse 92, temperature 97.9 F (36.6 C), temperature source Oral, resp. rate 16, height 6' (1.829 m), weight 92.8 kg (204 lb 9.4 oz), SpO2 100.00%. HEENT He follows objects by tracking lateral to both sides. He does follow commands. Tongue is midline with a whitish coating.  General he's in no acute distress. He slowly responds to questions but does not attempt any verbalization. Instead he will follow commands such as manual muscle testing.  He turns his head from side to side. He is reduced oral motor skills.  Lungs are clear to auscultation but with decreased breath sounds Heart regular rate and rhythm no rubs murmurs or extra sounds Abdomen mildly distended positive bowel sounds has a G. tube in place without drainage Extremities show no clubbing cyanosis. There is mild dorsal hand edema right greater than left side. This mild pedal edema bilaterally. Sensation cannot be assessed secondary to severe dysarthria Motor strength is Q. minus in the right deltoid bicep tricep and grip and Q. minus in the left deltoid 3 at the left bicep left tricep and 4 at the grip. Lower extremity strength is 3 minus bilateral hip flexion knee extension ankle dorsiflexion.   Assessment/Plan: Diagnosis: Bilateral cerebral infarcts causing right upper extremity greater than left upper extremity weakness and mild bilateral lower extremity weakness as well as severe dysarthria, severe swallowing deficits, and decreased level of alertness. 1. Does the need for close, 24 hr/day medical supervision in concert with the patient's rehab needs make it unreasonable for this patient to be served in a less intensive setting? Yes 2. Co-Morbidities requiring supervision/potential complications: Respiratory failure, atrial fibrillation, gout, coronary artery disease, congestive heart failure. 3. Due to bladder management, bowel management, safety, skin/wound care, disease management, medication administration, pain management and patient education, does the patient require 24 hr/day rehab nursing? Potentially 4. Does the patient require coordinated care of a physician, rehab nurse, PT (1-2 hrs/day, 5 days/week) and OT (1-2 hrs/day, 5 days/week) speech therapy 0.5-1 hours per day 5 days per week to address physical and functional deficits in the context of the above medical diagnosis(es)? Potentially Addressing deficits in the following areas: balance, bathing,  bowel/bladder control, cognition, dressing, endurance, feeding, grooming, locomotion, speech, strength, swallowing, toileting and transferring 5. Can the patient actively participate in an intensive therapy program of at least 3 hrs of therapy per day at least 5 days per week? Potentially 6. The potential for patient to make measurable gains while on inpatient rehab is fair 7. Anticipated functional outcomes upon discharge from inpatients are moderate assistance with mobility at a wheelchair level PT, moderate assist with ADLs OT, safe by mouth intake of solids and liquid without evidence of aspiration, establish basic munication of medical needs SLP 8. Estimated rehab length of stay to reach the above functional goals is: 4 weeks 9. Does the patient have adequate social supports to accommodate these discharge functional goals? Unclear whether the patient has a caregiver that can participate at this level. This reportedly a retired brother. 10. Anticipated D/C setting: Home versus skilled nursing depending on caregiver abilities. 11. Anticipated post D/C treatments: Depends on discharge environment 12. Overall Rehab/Functional Prognosis: fair  RECOMMENDATIONS: This patient's condition is appropriate for continued rehabilitative care in the following setting:  Please see above Patient has agreed to participate in recommended program. Patient is unable to verbalize understanding of this. Note that insurance prior authorization may be required for reimbursement for recommended care.  Comment:   Jacquelynn Cree 12/18/2010

## 2010-12-18 NOTE — Progress Notes (Signed)
Speech Pathology:  Treatment Note  Subjective:  Patient alert but reported mild lethargy today, brother present and confirmed increased lethargy from 12/5.   Objective:  PMSV placed with initial tolerance, maintenance of vital signs (HR low 100s, RR 18-20, O2 mid 90s), and low intensity, hoarse, but audible vocal quality. Following 1-2 minutes of placement, patient presented with increased WOB, aphonic speech, gesturing towards trach site. PMSV removed with significant CO2 trapping noted. Secretions minimal today from trach site. Patient expectorating well. Strongly feel that size of trach (8.0 shiley) is major contributing factor to decreased upper airway patency and overall poor tolerance of PMSV at this time. Discussed with CCM NP who will f/u in next 1-2 days for re-evaluation of possible trach downsize. SLP will continue to f/u.   Assessment:  See above  Recommendations:  See above  Pain:   none Intervention Required:   No   Goals: Progressing  Ferdinand Lango MA, CCC-SLP [phone]

## 2010-12-18 NOTE — Progress Notes (Signed)
Stroke Team Progress Note  SUBJECTIVE [name] who feels their symptoms are stable. He tolerated the Passy-Muir valve yesterday with therapy. He had gelatinous stools this morning per RN. Brother interested in inpatient rehabilitation. Physical therapy supports this.  OBJECTIVE Most recent Vital Signs: Temp: 97.9 F (36.6 C) (12/06 0757) Temp src: Oral (12/06 0124) BP: 156/111 mmHg (12/06 0814) Pulse Rate: 72  (12/06 0814) Respiratory Rate: 16 O2 Saturdation: 100%  CBG (last 3)   Basename 12/18/10 0757 12/18/10 0354 12/18/10 0122  GLUCAP 82 133* 121*   Intake/Output from previous day: 12/05 0701 - 12/06 0700 In: 1980 [I.V.:550; NG/GT:50] Out: 1350 [Urine:1350]  IV Fluid Intake     . sodium chloride 20 mL/hr at 12/17/10 1832  . feeding supplement (JEVITY 1.2) 1,000 mL (12/18/10 0347)   Diet    tube feeding  Activity  Up with assistance  DVT Prophylaxis  SCDs   Studies Dg Chest Port 1 View 12/17/2010   No significant change.   Physical Exam  awake alert Neck is supple without bruit. Distal pulses are felt. Cardiac exam atrial fibrillation rapid heart rate. Lungs clear to auscultation. Dependent extremities are edematous.Left knee is swollen but without pain or tenderness.  Neurological exam : Sleepy this am but awakens easily and following commands well.Eye movements are full range with saccadic dysmetria horizontally. Blinks to threat bilaterally. Minimal left lower facial weakness. Moves all 4 extremities well against gravity but there is mild left-sided weakness. He is able to hold extremities up against gravity.   ASSESSMENT [name] is a 55 y.o. male with right hemispheric brain infarcts, secondary to atrial fibrillation, off antiplatelets/anticoagulats secondary to post ischemic stroke cerebral hemorrhage, GIBleed and trach hemorrhage. Will leave off ASA and lovenox. Gelatinous stools today are a result of these hemorrhages.  Afib  rate remains elevated. cardizem increased yest. Will continue to follow.   Too soon to downsize trach at this point. Will consider in the future prior to discharge to skilled nursing facility.  Stroke risk factors:atrial fibrillation, diabetes mellitus, hyperlipidemia and CAD   Hospital day # 55  TREATMENT/PLAN Inpatient rehabilitation physician consult. Brother feels family is able to support him at home. Continue present treatment. Discussed the patient's brother.  Joaquin Music, ANP-BC, GNP-BC [facility] Pager: [phone] 12/18/2010 8:21 AM  Dr. Delia Heady, Stroke Center Medical Director, has personally reviewed chart, pertinent data, examined the patient and developed the plan of care.

## 2010-12-18 NOTE — Progress Notes (Signed)
Patient has clear,  jelly-like bowel movement per report received from night shift.  Abdomen is distended and  hypoactive bowel sounds.

## 2010-12-18 NOTE — Consult Note (Signed)
Reason for Consult:  atrial fibrillation management Referring Physician: CCM  [name] is an 55 y.o. male.  HPI: Briefly patient is a 55 year old male with past medical history significant for nonischemic dilated cardiomyopathy chronic atrial fibrillation hypertension was admitted on 11 7 with hypertensive emergency and was noted to have cerebellar infarct secondary to cardiogenic emboli subsequently converted to hemorrhagic infarct. While on anticoagulants patient has a long stormy course in the hospital subsequently requiring tracheostomy and PEG tube placement Cardiologic the consult is called as patient has elevated blood pressures and occasional atrial fibrillation with rapid ventricular response for management of atrial fibrillation clearly patient is not a candidate for chronic anticoagulation in view of his recent cerebellar hemorrhage and is at high risk of recurrent cardiogenic CVA Her please see full consult done on 11/21/2010 Patient is awake follows verbal commands by nodding his head and able to move his upper and lower extremities Patient denies chest pain or shortness of breath by nodding his head Patient remains hemodynamically stable   Patient remains in A. fib with a moderate ventricular response  Past Medical History  Diagnosis Date  . Diabetes mellitus   . Coronary artery disease   . Hypertension   . Gout   . CHF (congestive heart failure)   . Afib   . Arthritis     Past Surgical History  Procedure Date  . Tracheostomy tube placement 11/28/2010    Procedure: TRACHEOSTOMY;  Surgeon: Susy Frizzle, MD;  Location: [facility] OR;  Service: ENT;  Laterality: N/A;  . Peg placement 12/03/2010    Procedure: PERCUTANEOUS ENDOSCOPIC GASTROSTOMY (PEG) PLACEMENT;  Surgeon: Hart Carwin, MD;  Location: [facility] ENDOSCOPY;  Service: Endoscopy;  Laterality: N/A;    History reviewed. No pertinent family history.  Social History:  reports that he quit smoking about 5 years ago. His  smoking use included Cigarettes. He does not have any smokeless tobacco history on file. He reports that he drinks about 6.6 ounces of alcohol per week. He reports that he does not use illicit drugs.  Allergies: No Known Allergies  Medications: I have reviewed the patient's current medications.  Results for orders placed during the hospital encounter of 11/19/10 (from the past 48 hour(s))  GLUCOSE, CAPILLARY     Status: Abnormal   Collection Time   12/16/10  7:26 PM      Component Value Range Comment   Glucose-Capillary 173 (*) 70 - 99 (mg/dL)    Comment 1 Documented in Chart      Comment 2 Notify RN     GLUCOSE, CAPILLARY     Status: Abnormal   Collection Time   12/16/10  8:47 PM      Component Value Range Comment   Glucose-Capillary 148 (*) 70 - 99 (mg/dL)    Comment 1 Documented in Chart      Comment 2 Notify RN     GLUCOSE, CAPILLARY     Status: Abnormal   Collection Time   12/17/10  4:01 AM      Component Value Range Comment   Glucose-Capillary 142 (*) 70 - 99 (mg/dL)    Comment 1 Documented in Chart      Comment 2 Notify RN     BASIC METABOLIC PANEL     Status: Abnormal   Collection Time   12/17/10  4:15 AM      Component Value Range Comment   Sodium 145  135 - 145 (mEq/L)    Potassium 3.2 (*) 3.5 -  5.1 (mEq/L)    Chloride 104  96 - 112 (mEq/L)    CO2 32  19 - 32 (mEq/L)    Glucose, Bld 144 (*) 70 - 99 (mg/dL)    BUN 16  6 - 23 (mg/dL)    Creatinine, Ser 4.09  0.50 - 1.35 (mg/dL)    Calcium 9.3  8.4 - 10.5 (mg/dL)    GFR calc non Af Amer >90  >90 (mL/min)    GFR calc Af Amer >90  >90 (mL/min)   PHOSPHORUS     Status: Normal   Collection Time   12/17/10  4:15 AM      Component Value Range Comment   Phosphorus 3.2  2.3 - 4.6 (mg/dL)   MAGNESIUM     Status: Normal   Collection Time   12/17/10  4:15 AM      Component Value Range Comment   Magnesium 2.1  1.5 - 2.5 (mg/dL)   CBC     Status: Abnormal   Collection Time   12/17/10  4:15 AM      Component Value Range  Comment   WBC 6.5  4.0 - 10.5 (K/uL)    RBC 3.20 (*) 4.22 - 5.81 (MIL/uL)    Hemoglobin 9.2 (*) 13.0 - 17.0 (g/dL)    HCT 81.1 (*) 91.4 - 52.0 (%)    MCV 89.4  78.0 - 100.0 (fL)    MCH 28.8  26.0 - 34.0 (pg)    MCHC 32.2  30.0 - 36.0 (g/dL)    RDW 78.2  95.6 - 21.3 (%)    Platelets 294  150 - 400 (K/uL)   DIFFERENTIAL     Status: Normal   Collection Time   12/17/10  4:15 AM      Component Value Range Comment   Neutrophils Relative 64  43 - 77 (%)    Neutro Abs 4.1  1.7 - 7.7 (K/uL)    Lymphocytes Relative 22  12 - 46 (%)    Lymphs Abs 1.5  0.7 - 4.0 (K/uL)    Monocytes Relative 10  3 - 12 (%)    Monocytes Absolute 0.7  0.1 - 1.0 (K/uL)    Eosinophils Relative 3  0 - 5 (%)    Eosinophils Absolute 0.2  0.0 - 0.7 (K/uL)    Basophils Relative 0  0 - 1 (%)    Basophils Absolute 0.0  0.0 - 0.1 (K/uL)   GLUCOSE, CAPILLARY     Status: Abnormal   Collection Time   12/17/10  9:12 AM      Component Value Range Comment   Glucose-Capillary 112 (*) 70 - 99 (mg/dL)    Comment 1 Documented in Chart      Comment 2 Notify RN     GLUCOSE, CAPILLARY     Status: Abnormal   Collection Time   12/17/10 12:41 PM      Component Value Range Comment   Glucose-Capillary 159 (*) 70 - 99 (mg/dL)    Comment 1 Notify RN     GLUCOSE, CAPILLARY     Status: Abnormal   Collection Time   12/17/10  4:55 PM      Component Value Range Comment   Glucose-Capillary 123 (*) 70 - 99 (mg/dL)    Comment 1 Notify RN     GLUCOSE, CAPILLARY     Status: Abnormal   Collection Time   12/17/10  8:20 PM      Component Value Range Comment   Glucose-Capillary 154 (*)  70 - 99 (mg/dL)    Comment 1 Documented in Chart      Comment 2 Notify RN     GLUCOSE, CAPILLARY     Status: Abnormal   Collection Time   12/18/10  1:22 AM      Component Value Range Comment   Glucose-Capillary 121 (*) 70 - 99 (mg/dL)    Comment 1 Documented in Chart      Comment 2 Notify RN     GLUCOSE, CAPILLARY     Status: Abnormal   Collection Time    12/18/10  3:54 AM      Component Value Range Comment   Glucose-Capillary 133 (*) 70 - 99 (mg/dL)   GLUCOSE, CAPILLARY     Status: Normal   Collection Time   12/18/10  7:57 AM      Component Value Range Comment   Glucose-Capillary 82  70 - 99 (mg/dL)    Comment 1 Notify RN     GLUCOSE, CAPILLARY     Status: Abnormal   Collection Time   12/18/10 11:37 AM      Component Value Range Comment   Glucose-Capillary 142 (*) 70 - 99 (mg/dL)    Comment 1 Notify RN     GLUCOSE, CAPILLARY     Status: Abnormal   Collection Time   12/18/10  4:36 PM      Component Value Range Comment   Glucose-Capillary 145 (*) 70 - 99 (mg/dL)    Comment 1 Notify RN       Dg Chest Port 1 View  12/17/2010  *RADIOLOGY REPORT*  Clinical Data: Check tracheostomy tube position.  PORTABLE CHEST - 1 VIEW  Comparison: 12/16/2010.  Findings: Tracheostomy tube tip midline.  Left PICC line tip mid superior vena cava level.  Cardiomegaly.  Central pulmonary vascular prominence.  Hazy parenchymal changes lung bases may represent atelectasis greater on the left.  Infiltrate not excluded.  Appearance without change.  IMPRESSION: No significant change.  Original Report Authenticated By: Fuller Canada, M.D.    Review of Systems  Unable to perform ROS  Blood pressure 149/117, pulse 88, temperature 98.2 F (36.8 C), temperature source Oral, resp. rate 26, height 6' (1.829 m), weight 92.8 kg (204 lb 9.4 oz), SpO2 100.00%. Physical Exam  Constitutional: He appears well-nourished.  HENT:  Head: Normocephalic.  Eyes: Conjunctivae are normal.  Neck: No JVD present.  Cardiovascular:       Irregularly irregular S1 and S2 is soft there is soft systolic and diastolic murmur at left lower sternal border no S3 gallop  Respiratory:       Decreased breath sound at bases  GI:       Soft bowel sounds are present peg site dressing dry  Musculoskeletal:       No clubbing cyanosis or edema    Assessment/Plan: Status post right hemorrhagic  cerebellar infarct Status post hypertensive emergency Nonischemic dilated cardiomyopathy Chronic atrial fibrillation with moderate control her response Status post cardiogenic CVA Status post aspiration pneumonia History of chronic kidney disease now stable Plan Will switch labetalol 2 carvedilol per orders And ACE inhibitors per orders in view of poor LV systolic function Will follow renal function and blood pressure closely  Severus Brodzinski N 12/18/2010, 6:52 PM

## 2010-12-19 ENCOUNTER — Inpatient Hospital Stay (HOSPITAL_COMMUNITY): Payer: 59

## 2010-12-19 LAB — CBC
Hemoglobin: 8.9 g/dL — ABNORMAL LOW (ref 13.0–17.0)
MCH: 28.2 pg (ref 26.0–34.0)
MCV: 89.2 fL (ref 78.0–100.0)
Platelets: 317 10*3/uL (ref 150–400)
RBC: 3.16 MIL/uL — ABNORMAL LOW (ref 4.22–5.81)

## 2010-12-19 LAB — GLUCOSE, CAPILLARY
Glucose-Capillary: 119 mg/dL — ABNORMAL HIGH (ref 70–99)
Glucose-Capillary: 121 mg/dL — ABNORMAL HIGH (ref 70–99)
Glucose-Capillary: 161 mg/dL — ABNORMAL HIGH (ref 70–99)
Glucose-Capillary: 171 mg/dL — ABNORMAL HIGH (ref 70–99)

## 2010-12-19 LAB — BASIC METABOLIC PANEL
CO2: 32 mEq/L (ref 19–32)
Calcium: 9.2 mg/dL (ref 8.4–10.5)
Glucose, Bld: 165 mg/dL — ABNORMAL HIGH (ref 70–99)
Sodium: 145 mEq/L (ref 135–145)

## 2010-12-19 MED ORDER — ALPRAZOLAM 0.25 MG PO TABS
0.2500 mg | ORAL_TABLET | Freq: Two times a day (BID) | ORAL | Status: DC | PRN
  Administered 2010-12-20 – 2010-12-21 (×2): 0.25 mg via ORAL
  Filled 2010-12-19 (×2): qty 1

## 2010-12-19 MED ORDER — CARVEDILOL 25 MG PO TABS
25.0000 mg | ORAL_TABLET | Freq: Two times a day (BID) | ORAL | Status: DC
  Administered 2010-12-19 – 2010-12-22 (×7): 25 mg
  Filled 2010-12-19 (×8): qty 1

## 2010-12-19 MED ORDER — LORAZEPAM 2 MG/ML IJ SOLN
1.0000 mg | Freq: Once | INTRAMUSCULAR | Status: AC
  Administered 2010-12-19: 1 mg via INTRAVENOUS
  Filled 2010-12-19: qty 1

## 2010-12-19 MED ORDER — POTASSIUM CHLORIDE CRYS ER 20 MEQ PO TBCR
40.0000 meq | EXTENDED_RELEASE_TABLET | Freq: Once | ORAL | Status: AC
  Administered 2010-12-19: 40 meq via ORAL
  Filled 2010-12-19: qty 2

## 2010-12-19 MED ORDER — RAMIPRIL 10 MG PO CAPS
10.0000 mg | ORAL_CAPSULE | Freq: Two times a day (BID) | ORAL | Status: DC
  Administered 2010-12-19 – 2010-12-22 (×6): 10 mg
  Filled 2010-12-19 (×7): qty 1

## 2010-12-19 NOTE — Progress Notes (Signed)
Physical Therapy Treatment Patient Details Name: [name] MRN: [id number] DOB: 10-04-1955 Today's Date: 12/19/2010  PT Assessment/Plan  PT - Assessment/Plan Comments on Treatment Session: noticeable progress with trunk control today as seen in scooting, sitting balance and the ability to stad more erect. Follow Up Recommendations: Inpatient Rehab Equipment Recommended: Defer to next venue PT Goals  Acute Rehab PT Goals Pt will Sit at [facility] of Bed: Other (comment) (min guard A statically; min assist for dynamic tasks) PT Goal: Sit at Edge Of Bed - Progress: Revised (modified due to lack of progress/goal met) PT Goal: Sit to Stand - Progress: Progressing toward goal PT Goal: Stand to Sit - Progress: Progressing toward goal Pt will Transfer Bed to Chair/Chair to Bed: with mod assist PT Transfer Goal: Bed to Chair/Chair to Bed - Progress: Revised (modified due to lack of progress/goal met)  PT Treatment Precautions/Restrictions  Precautions Precautions: Fall Precaution Comments: Spoke with RN who notes ok for Therapy to see pt.  Only perfomed bed level activity secondary to elevated BP.   Required Braces or Orthoses: No Restrictions Weight Bearing Restrictions: No Mobility (including Balance) Bed Mobility Supine to Sit: 1: +2 Total assist;Patient percentage (comment);Other (comment) (pt =50%) Supine to Sit Details (indicate cue type and reason): vc for normal movement, manual A to initiate and help follow through for sit up (with vc and min A, pt set up and completed bridging to EOB) Sitting - Scoot to Edge of Bed: 4: Min assist Transfers Transfers: Yes Sit to Stand: 1: +2 Total assist;Patient percentage (comment);From bed;With upper extremity assist;Other (comment) (pt from 40 to > or = 50%; much less A to tuck in his hips) Sit to Stand Details (indicate cue type and reason): time 3; progressively letting pt do more of the set up, vc's for hand placement and manual A for forward  w/shift and truncal control.  Much less Assist than last treatment Stand to Sit: 1: +2 Total assist;Patient percentage (comment);To bed (pt > 50%) Squat Pivot Transfers: 1: +2 Total assist;Patient percentage (comment);Other (comment) (pt =>40%) Squat Pivot Transfer Details (indicate cue type and reason): vc's for hand placement; manual A for support and w/shift for pivotal steps  Posture/Postural Control Posture/Postural Control: Postural limitations Postural Limitations: although improving, still has decr. coordination of trunk Balance Balance Assessed: Yes Static Sitting Balance Static Sitting - Balance Support: Right upper extremity supported;Left upper extremity supported;Feet supported Static Sitting - Level of Assistance: 4: Min assist Static Sitting - Comment/# of Minutes: 10 Static Standing Balance Static Standing - Balance Support: Right upper extremity supported;Left upper extremity supported Static Standing - Level of Assistance: 1: +2 Total assist;Patient percentage (comment) (pt>50%) Static Standing - Comment/# of Minutes: 3-4 (times 3) Exercise    End of Session PT - End of Session Activity Tolerance: Patient tolerated treatment well Patient left: in chair Nurse Communication: Mobility status for transfers General Behavior During Session: [facility] for tasks performed Cognition: [facility] for tasks performed  Samanda Buske, Eliseo Gum 12/19/2010, 4:54 PM  12/19/2010   Bing, PT [phone] (pager)

## 2010-12-19 NOTE — Progress Notes (Signed)
eLink Physician-Brief Progress Note Patient Name: [name] DOB: [birth year] MRN: [id number]  Date of Service  12/19/2010   HPI/Events of Note   Call from nurse stating patient is anxious, pulling at lines.  Patient admits he is anxious.  Patient is not confused.  Fentanyl has not helped this behavior  eICU Interventions  Plan: 1 time dose of ativan 1 mg IV for anxiety.   Intervention Category Minor Interventions: Agitation / anxiety - evaluation and management  DETERDING,ELIZABETH 12/19/2010, 3:48 AM

## 2010-12-19 NOTE — Progress Notes (Signed)
Stroke Team Progress Note  SUBJECTIVE [name] whose symptoms are stable. his brother is at the bedside. Rehab cardiology MD has seen. Received ativan and fentanyl overnight for anxiety. Brother concerned with pts HR, BP and that pt is "feeling warm" Dr. Pearlean Brownie discussed in detail. Brother focusing on minute to minute and hour to hours fluctuations within expected limits.  OBJECTIVE Most recent Vital Signs: Temp: 98 F (36.7 C) (12/07 0420) Temp src: Oral (12/07 0420) BP: 163/127 mmHg (12/07 0600) Pulse Rate: 91  (12/07 0600) Respiratory Rate: 12 O2 Saturdation: 100%  CBG (last 3)   Basename 12/19/10 0419 12/19/10 0024 12/18/10 2004  GLUCAP 161* 119* 135*   Intake/Output from previous day: 12/06 0701 - 12/07 0700 In: 1980 [I.V.:430; NG/GT:290] Out: 801 [Urine:800; Stool:1]  IV Fluid Intake     . sodium chloride 20 mL/hr at 12/17/10 1832  . feeding supplement (JEVITY 1.2) 1,000 mL (12/18/10 0347)   Diet    NPO, tube feedings via PEG  Activity  Up with assistance  DVT Prophylaxis  SCDs   Studies BASIC METABOLIC PANEL     Status: Abnormal   Collection Time   12/19/10  4:45 AM      Component Value Range   Sodium 145  135 - 145 (mEq/L)   Potassium 3.0 (*) 3.5 - 5.1 (mEq/L)   Chloride 104  96 - 112 (mEq/L)   CO2 32  19 - 32 (mEq/L)   Glucose, Bld 165 (*) 70 - 99 (mg/dL)   BUN 15  6 - 23 (mg/dL)   Creatinine, Ser 4.09  0.50 - 1.35 (mg/dL)   Calcium 9.2  8.4 - 81.1 (mg/dL)   GFR calc non Af Amer >90  >90 (mL/min)   GFR calc Af Amer >90  >90 (mL/min)  CBC     Status: Abnormal   Collection Time   12/19/10  4:45 AM      Component Value Range   WBC 7.0  4.0 - 10.5 (K/uL)   RBC 3.16 (*) 4.22 - 5.81 (MIL/uL)   Hemoglobin 8.9 (*) 13.0 - 17.0 (g/dL)   HCT 91.4 (*) 78.2 - 52.0 (%)   MCV 89.2  78.0 - 100.0 (fL)   MCH 28.2  26.0 - 34.0 (pg)   MCHC 31.6  30.0 - 36.0 (g/dL)   RDW 95.6  21.3 - 08.6 (%)   Platelets 317  150 - 400 (K/uL)  MAGNESIUM      Status: Normal   Collection Time   12/19/10  4:45 AM      Component Value Range   Magnesium 2.3  1.5 - 2.5 (mg/dL)  PHOSPHORUS     Status: Normal   Collection Time   12/19/10  4:45 AM      Component Value Range   Phosphorus 4.0  2.3 - 4.6 (mg/dL)    Dg Chest Port 1 View 12/19/2010  No significant change.   Physical Exam awake alert Neck is supple without bruit. Distal pulses are felt. Cardiac exam atrial fibrillation rapid heart rate. Lungs clear to auscultation. Dependent extremities are edematous.Left knee is swollen but without pain or tenderness.  Neurological exam : Sleepy this am but awakens easily and following commands well.Eye movements are full range with saccadic dysmetria horizontally. Blinks to threat bilaterally. Minimal left lower facial weakness. Moves all 4 extremities well against gravity but there is mild left-sided weakness. He is able to hold extremities up against gravity.   ASSESSMENT Mr. Murphy  [name] is a 55 y.o. male with right hemispheric brain infarcts, secondary to atrial fibrillation, off antiplatelets/anticoagulants secondary to post ischemic stroke cerebral hemorrhage, GI Bleed and trach hemorrhage. Will leave off ASA and lovenox.  Afib, BP - Dr [name] saw yesterday and adjusted medications .   Anxiety-received fentanyl and Ativan last night.  Stroke risk factors:atrial fibrillation, hypertension, diabetes mellitus, hyperlipidemia and CAD   Hospital day # 30  TREATMENT/PLAN Await rehabilitation decision about transfer there. Brother states family is prepared to take him home. Discussed with critical care about possible transfer to the floor. Discontinue fentanyl and Ativan. Xanax 0.25 mg twice a day when necessary for anxiety  SHARON BIBY, AVNP, ANP-BC, GNP-BC [facility] Pager: [phone] 12/19/2010 8:44 AM  Dr. Delia Heady, Stroke Center Medical Director, has personally reviewed chart, pertinent data, examined the patient and  developed the plan of care.

## 2010-12-19 NOTE — Progress Notes (Signed)
Spoke with Ms. Whitney Post regarding patient's plan of discharge.  She stated that she will be talking with the patient's brother today for possible inpatient rehab after insurance approval.

## 2010-12-19 NOTE — Progress Notes (Signed)
Meredith from RT is notified of the new order to downsize the trach to #6.

## 2010-12-19 NOTE — Progress Notes (Signed)
Subjective:  Patient more awake alert in no acute distress BP and heart rate still very labile and remains elevated  Objective:  Vital Signs in the last 24 hours: Temp:  [97.8 F (36.6 C)-98.1 F (36.7 C)] 98.1 F (36.7 C) (12/07 1600) Pulse Rate:  [55-103] 91  (12/07 1600) Resp:  [12-23] 16  (12/07 1600) BP: (130-184)/(99-127) 154/99 mmHg (12/07 1600) SpO2:  [98 %-100 %] 100 % (12/07 1600) FiO2 (%):  [28 %] 28 % (12/07 1600) Weight:  [92.4 kg (203 lb 11.3 oz)] 203 lb 11.3 oz (92.4 kg) (12/07 0023)  Intake/Output from previous day: 12/06 0701 - 12/07 0700 In: 2060 [I.V.:450; NG/GT:290] Out: 801 [Urine:800; Stool:1] Intake/Output from this shift: Total I/O In: 570 [I.V.:150; Other:420] Out: 900 [Urine:900]  Physical Exam: Neck: no carotid bruit, no JVD and And trach collar dressing dry Lungs: diminished breath sounds bibasilar Heart: irregularly irregular rhythm and S1-S2 soft there is soft systolic murmur noted Abdomen: soft, non-tender; bowel sounds normal; no masses,  no organomegaly and PEG site dressing dry Extremities: extremities normal, atraumatic, no cyanosis or edema  Lab Results:  Basename 12/19/10 0445 12/17/10 0415  WBC 7.0 6.5  HGB 8.9* 9.2*  PLT 317 294    Basename 12/19/10 0445 12/17/10 0415  NA 145 145  K 3.0* 3.2*  CL 104 104  CO2 32 32  GLUCOSE 165* 144*  BUN 15 16  CREATININE 0.87 0.77   No results found for this basename: TROPONINI:2,CK,MB:2 in the last 72 hours Hepatic Function Panel No results found for this basename: PROT,ALBUMIN,AST,ALT,ALKPHOS,BILITOT,BILIDIR,IBILI in the last 72 hours No results found for this basename: CHOL in the last 72 hours No results found for this basename: PROTIME in the last 72 hours  Imaging: Imaging results have been reviewed  Cardiac Studies:  Assessment/Plan:  Status post right hemorrhagic cerebellar infarct Nonischemic dilated cardiomyopathy  Uncontrolled hypertension Atrial fibrillation with the  more great length for response Status post cardiogenic CVA Status post trach and PEG Plan Increase carvedilol to 25 mg twice daily Increase ramipril to 10 mg twice daily Replace K.  LOS: 30 days    [name] N 12/19/2010, 5:37 PM

## 2010-12-19 NOTE — Progress Notes (Signed)
Subjective:      [name] is a 55 y.o. male former smoker admitted on 11/19/2010 with acute onset of slurred speech and Lt sided weakness. Dx Cerebellar ischemic cva then converted to bleed. Transferred to SDU 11/24 but returned to ICU 11/25 with decreased LOC which resolved with reinitiation of mechanical ventilation.     Overnight: RN reports jelly-like stools, distended abdomen.  No bleeding from trach.  No acute events.  Family asking about afib and treatment (previously followed by Dr. [name])  Objective: Vital signs in last 24 hours: Temp:  [97.8 F (36.6 C)-98.2 F (36.8 C)] 98 F (36.7 C) (12/07 1200) Pulse Rate:  [55-103] 55  (12/07 1300) Resp:  [12-26] 17  (12/07 1300) BP: (133-184)/(99-127) 176/119 mmHg (12/07 1300) SpO2:  [98 %-100 %] 100 % (12/07 1300) FiO2 (%):  [28 %] 28 % (12/07 1100) Weight:  [92.4 kg (203 lb 11.3 oz)] 203 lb 11.3 oz (92.4 kg) (12/07 0023)  Exam- Gen- drowsy today. No distress.  Skin- no rash Neck- #8 trach midline, c/d/i Neuro- Passive, not moving arms or legs voluntarily. Lungs- clear. No bleeding Heart- AFib, distant tones Abd- obese, distened, decreased bowel sounds  PEG.  Lab Results:  [facility] 12/19/10 0445 12/17/10 0415  WBC 7.0 6.5  HGB 8.9* 9.2*  HCT 28.2* 28.6*  PLT 317 294   BMET  Basename 12/19/10 0445 12/17/10 0415  NA 145 145  K 3.0* 3.2*  CL 104 104  CO2 32 32  GLUCOSE 165* 144*  BUN 15 16  CREATININE 0.87 0.77  CALCIUM 9.2 9.3    Studies/Results: Dg Chest Port 1 View  12/19/2010  *RADIOLOGY REPORT*  Clinical Data: Check tracheostomy tube placement.  PORTABLE CHEST - 1 VIEW  Comparison: 12/17/2010.  Findings: Tracheostomy tube tip midline.  Left central line tip mid superior vena cava level.  Cardiomegaly.  Central pulmonary vascular prominence.  Left base atelectasis, no gross pneumothorax.  Minimally tortuous aorta.  IMPRESSION: No significant change.  Original Report Authenticated By: Fuller Canada, M.D.     Medications: Medications reviewed.   Assessment/Plan: 1) CVA- cerebellar ischemic infarct>hemorrhage. per stoke team, no changes  2) Acute resp failure- continues trach colar with new 8 shiley cuff x 24 hours - pCXR may represent some asp blood on rt , will follow in am , but clinically appears well - Continue trach collar, avoiding Vent when able and risk VAP - Continue efforts with ST, PMV use - Will downsize trach to 6 cuffed today per RT.  3) Afib- rate controlled. Lovenox now off with bleeding at trach, scd, rate controlled.   - Consult Dr. [name] for further input.  4)HBP- continue cardizem, still uncontrolled -range from 133-180 systolic - Continue oral labetolol.  5) Hypernatremia, hypokalemia-improved,  - Follow PRN.  6) DM - controlled on tube feed well  Lab 12/19/10 1113 12/19/10 0800 12/19/10 0419 12/19/10 0024 12/18/10 2004  GLUCAP 121* 171* 161* 119* 135*    7) Disposition - family would like CIR and then hopeful for transition to home.  Brother ok for patient to go home with him after discharge.     LOS: 30 days   Koren Bound, M.D. [phone]

## 2010-12-19 NOTE — Progress Notes (Signed)
CSW note rehab consult and will hold on SNF for rehab eval. CSW will continue to follow.

## 2010-12-20 LAB — GLUCOSE, CAPILLARY
Glucose-Capillary: 94 mg/dL (ref 70–99)
Glucose-Capillary: 126 mg/dL — ABNORMAL HIGH (ref 70–99)

## 2010-12-20 NOTE — Plan of Care (Signed)
Problem: Phase I Progression Outcomes Goal: Initial discharge plan identified Outcome: Progressing Acute rehab next step Goal: Voiding-avoid urinary catheter unless indicated Outcome: Progressing Condom cath  Problem: Phase II Progression Outcomes Goal: Obtain order to discontinue catheter if appropriate Outcome: Completed/Met Date Met:  12/20/10 Condom only

## 2010-12-20 NOTE — Progress Notes (Signed)
Subjective: Stable overnight.  On tc with no increased wob.2  Objective: Vital signs in last 24 hours: Blood pressure 127/87, pulse 71, temperature 98.3 F (36.8 C), temperature source Oral, resp. rate 18, height 6' (1.829 m), weight 92.4 kg (203 lb 11.3 oz), SpO2 100.00%.  Intake/Output from previous day: 12/07 0701 - 12/08 0700 In: 1530 [I.V.:270; NG/GT:120] Out: 2325 [Urine:2325]   Physical Exam:   well developed male in nad Chest with decreased depth inspiration, mild basilar crackles Cor with irreg, but cvr abd protuberant with gas, not tender, bs+ Alert, tries to communicate.     Lab Results:  [facility] 12/19/10 0445  WBC 7.0  HGB 8.9*  HCT 28.2*  PLT 317   BMET  Basename 12/19/10 0445  NA 145  K 3.0*  CL 104  CO2 32  GLUCOSE 165*  BUN 15  CREATININE 0.87  CALCIUM 9.2    Studies/Results: Dg Chest Port 1 View  12/19/2010  *RADIOLOGY REPORT*  Clinical Data: Check tracheostomy tube placement.  PORTABLE CHEST - 1 VIEW  Comparison: 12/17/2010.  Findings: Tracheostomy tube tip midline.  Left central line tip mid superior vena cava level.  Cardiomegaly.  Central pulmonary vascular prominence.  Left base atelectasis, no gross pneumothorax.  Minimally tortuous aorta.  IMPRESSION: No significant change.  Original Report Authenticated By: Fuller Canada, M.D.    Assessment/Plan: Patient Active Hospital Problem List:  Stroke (11/19/2010)   Assessment: per stroke team    Respiratory failure (11/19/2010)   Assessment: resolving.  Doing well on tc with supplemental oxygen at low level.   Plan: continue pulm toilet.  A-fib (11/19/2010)   Assessment: per cardiology      Barbaraann Share, M.D. 12/20/2010, 1:35 PM

## 2010-12-20 NOTE — Progress Notes (Signed)
Subjective: The patient is alert and cooperative. Tracheostomy in place, PEG tube in place. The patient will try to mouth words. The patient reports some abdominal pain. The patient denies headache, or shortness of breath.  Nursing staff indicate that the patient felt as if he were having a new stroke this morning, but no new neurologic deficits were noted. The patient appears to be clinically stable.  Objective: Vital signs in last 24 hours: Temp:  [97.3 F (36.3 C)-98.7 F (37.1 C)] 98.3 F (36.8 C) (12/08 1216) Pulse Rate:  [63-136] 71  (12/08 1237) Resp:  [14-23] 18  (12/08 1237) BP: (120-190)/(75-118) 127/87 mmHg (12/08 1237) SpO2:  [97 %-100 %] 100 % (12/08 1237) FiO2 (%):  [28 %] 28 % (12/08 1237) Weight change:  Last BM Date: 12/19/10  Intake/Output from previous day: 12/07 0701 - 12/08 0700 In: 1530 [I.V.:270; NG/GT:120] Out: 2325 [Urine:2325] Intake/Output this shift: Total I/O In: 480 [P.O.:480] Out: 150 [Urine:150]  @ROS @ The patient denies headache, shortness of breath, chest pain.  The patient notes some abdominal discomfort.     Physical Examination:  Mental Status Exam: The patient is alert and cooperative at the time of the examination.  Motor Exam: The patient moves all 4 extremities, and has good strength bilaterally. The patient has some difficulty with abduction of the left arm.  Cerebellar Exam: The patient appears to have a hemiataxia with the right side, arm greater than leg. Gait was not tested.  Deep tendon reflexes: Deep tendon reflexes are symmetric throughout. Toes are downgoing bilaterally.  Cranial Nerve Exam: Facial symmetry is not present. There appears be a minimal left facial droop. Extraocular movements are full. Course lateral mastitis is seen with gaze bilaterally. Visual fields are full.     Lab Results:  [facility] 12/19/10 0445  WBC 7.0  HGB 8.9*  HCT 28.2*  PLT 317   BMET  Basename 12/19/10 0445  NA 145  K 3.0*  CL  104  CO2 32  GLUCOSE 165*  BUN 15  CREATININE 0.87  CALCIUM 9.2    Studies/Results: Dg Chest Port 1 View  12/19/2010  *RADIOLOGY REPORT*  Clinical Data: Check tracheostomy tube placement.  PORTABLE CHEST - 1 VIEW  Comparison: 12/17/2010.  Findings: Tracheostomy tube tip midline.  Left central line tip mid superior vena cava level.  Cardiomegaly.  Central pulmonary vascular prominence.  Left base atelectasis, no gross pneumothorax.  Minimally tortuous aorta.  IMPRESSION: No significant change.  Original Report Authenticated By: Fuller Canada, M.D.    Medications:  Scheduled:   . antiseptic oral rinse  15 mL Mouth Rinse QID  . carvedilol  25 mg Per Tube BID WC  . chlorhexidine  15 mL Mouth Rinse BID  . diltiazem  90 mg Per Tube Q6H  . insulin aspart  0-20 Units Subcutaneous Q4H  . insulin aspart  4 Units Subcutaneous Q4H  . insulin glargine  20 Units Subcutaneous BID  . isosorbide-hydrALAZINE  1 tablet Oral TID  . pantoprazole sodium  40 mg Per Tube Q1200  . potassium chloride  40 mEq Oral Once  . ramipril  10 mg Per Tube BID  . rosuvastatin  5 mg Oral q1800  . sodium chloride  10 mL Intracatheter Q12H  . DISCONTD: carvedilol  12.5 mg Per Tube BID WC  . DISCONTD: ramipril  5 mg Per Tube BID   Continuous:   . sodium chloride 20 mL/hr at 12/17/10 1832  . feeding supplement (JEVITY 1.2) 1,000 mL (12/19/10 1733)  ZOX:WRUEAVWUJWJXB (TYLENOL) oral liquid 160 mg/5 mL, ALPRAZolam, labetalol, metoprolol History: PMH: Atrial fibrillation. Coronary artery disease. Risk factors: Former tobacco use. Diabetes mellitus.  ------------------------------------------------------------ Study Conclusions 2 D echo  - Left ventricle: The cavity size was normal. Wall thickness was increased in a pattern of severe LVH. Systolic function was severely reduced. The estimated ejection fraction was in the range of 25% to 30%. Diffuse hypokinesis. Akinesis of the inferior myocardium. - Aortic  valve: Mild regurgitation. - Aortic root: The aortic root was moderately dilated. - Mitral valve: Mild regurgitation. - Left atrium: The appendage was severely dilated. - Right atrium: The atrium was mildly dilated. - Pulmonary arteries: Systolic pressure was mildly increased. PA peak pressure: 32mm Hg (S).  MRI:  1. Acute / sub acute non hemorrhagic infarct involving the right  superior cerebellar artery territory, including the posterolateral  inferior mid brain and upper pons.  2. Remote lacunar infarct of the left cerebellum.  3. Multiple punctate areas of remote hemorrhage, compatible with  amyloid angiopathy.  Assessment/Plan:  [name] is a 55 y.o. male with right hemispheric brain infarcts, secondary to atrial fibrillation, off antiplatelets/anticoagulants secondary to post ischemic stroke cerebral hemorrhage, GI Bleed and trach hemorrhage. Will leave off ASA and lovenox.  Afib, BP - Dr [name] saw yesterday and adjusted medications .  Anxiety-received fentanyl and Ativan last night.  Stroke risk factors:atrial fibrillation, hypertension, diabetes mellitus, hyperlipidemia and CAD   The patient has a very low ejection fraction, atrial fibrillation, and an acute stroke involving the right cerebellum. MRI the brain is consistent with amyloid angiopathy. Patient is off of Coumadin and antiplatelet agents. Ongoing supportive care.     LOS: 31 days   Tacori Kvamme KEITH 12/20/2010, 2:17 PM

## 2010-12-21 DIAGNOSIS — Z9911 Dependence on respirator [ventilator] status: Secondary | ICD-10-CM

## 2010-12-21 DIAGNOSIS — I629 Nontraumatic intracranial hemorrhage, unspecified: Secondary | ICD-10-CM

## 2010-12-21 DIAGNOSIS — Z930 Tracheostomy status: Secondary | ICD-10-CM

## 2010-12-21 DIAGNOSIS — J9600 Acute respiratory failure, unspecified whether with hypoxia or hypercapnia: Secondary | ICD-10-CM

## 2010-12-21 LAB — GLUCOSE, CAPILLARY
Glucose-Capillary: 173 mg/dL — ABNORMAL HIGH (ref 70–99)
Glucose-Capillary: 191 mg/dL — ABNORMAL HIGH (ref 70–99)
Glucose-Capillary: 121 mg/dL — ABNORMAL HIGH (ref 70–99)
Glucose-Capillary: 135 mg/dL — ABNORMAL HIGH (ref 70–99)

## 2010-12-21 LAB — BASIC METABOLIC PANEL
BUN: 15 mg/dL (ref 6–23)
CO2: 35 mEq/L — ABNORMAL HIGH (ref 19–32)
Chloride: 105 mEq/L (ref 96–112)
Creatinine, Ser: 0.86 mg/dL (ref 0.50–1.35)
GFR calc Af Amer: >90 mL/min (ref >90)
Potassium: 3 mEq/L — ABNORMAL LOW (ref 3.5–5.1)

## 2010-12-21 MED ORDER — POTASSIUM CHLORIDE CRYS ER 20 MEQ PO TBCR
20.0000 meq | EXTENDED_RELEASE_TABLET | Freq: Every day | ORAL | Status: DC
  Administered 2010-12-21 – 2010-12-22 (×2): 20 meq via ORAL
  Filled 2010-12-21 (×2): qty 1

## 2010-12-21 MED ORDER — ISOSORB DINITRATE-HYDRALAZINE 20-37.5 MG PO TABS
2.0000 | ORAL_TABLET | Freq: Three times a day (TID) | ORAL | Status: DC
  Administered 2010-12-21 – 2010-12-22 (×4): 2 via ORAL
  Filled 2010-12-21 (×5): qty 2

## 2010-12-21 MED ORDER — POTASSIUM CHLORIDE 20 MEQ/15ML (10%) PO LIQD
60.0000 meq | Freq: Once | ORAL | Status: AC
  Administered 2010-12-21: 60 meq
  Filled 2010-12-21: qty 45

## 2010-12-21 MED ORDER — BISACODYL 10 MG RE SUPP
10.0000 mg | Freq: Every day | RECTAL | Status: DC | PRN
Start: 2010-12-21 — End: 2010-12-22
  Filled 2010-12-21: qty 1

## 2010-12-21 MED ORDER — FREE WATER
200.0000 mL | Freq: Three times a day (TID) | Status: DC
  Administered 2010-12-21 – 2010-12-22 (×3): 200 mL
  Filled 2010-12-21 (×6): qty 200

## 2010-12-21 MED ORDER — SODIUM CHLORIDE 0.45 % IV SOLN
INTRAVENOUS | Status: DC
  Administered 2010-12-21: 20 mL/h via INTRAVENOUS

## 2010-12-21 NOTE — Progress Notes (Signed)
eLink Physician-Brief Progress Note Patient Name: [name] DOB: March 29, 1955 MRN: [id number]  Date of Service  12/21/2010   HPI/Events of Note   hypokalemia  eICU Interventions  Potassium replaced   Intervention Category Minor Interventions: Electrolytes abnormality - evaluation and management  DETERDING,ELIZABETH 12/21/2010, 5:36 AM

## 2010-12-21 NOTE — Progress Notes (Signed)
Subjective: Patient is alert, and will cooperate. Tracheotomy is in place, the patient is unable to speak. PEG tube is in place, the patient is receiving tube feeds.  Family is present in the room.  Objective: Vital signs in last 24 hours: Temp:  [97.6 F (36.4 C)-99 F (37.2 C)] 99 F (37.2 C) (12/09 1208) Pulse Rate:  [25-101] 84  (12/09 1234) Resp:  [14-22] 18  (12/09 1234) BP: (112-171)/(88-119) 171/109 mmHg (12/09 1234) SpO2:  [94 %-100 %] 95 % (12/09 1234) FiO2 (%):  [28 %] 28 % (12/09 1234) Weight:  [91 kg (200 lb 9.9 oz)] 200 lb 9.9 oz (91 kg) (12/09 0500) Weight change:  Last BM Date: 12/19/10  Intake/Output from previous day: 12/08 0701 - 12/09 0700 In: 2325 [I.V.:480; NG/GT:705] Out: 1201 [Urine:1140; Drains:60; Stool:1] Intake/Output this shift: Total I/O In: 910 [I.V.:140; Other:420; NG/GT:350] Out: 1600 [Urine:1600]  @ROS @  Unable to obtain review of systems, patient is unable to speak. The patient does indicate that he is seeing double. The patient notes some mild headache.   Physical Examination:  Mental Status Exam: The patient is alert and cooperative at the time of the examination.  Motor Exam: The patient moves all 4 extremities, and has slight weakness of the left arm and left leg.  Cerebellar Exam: The patient has significant hemiataxia with the right side, more prominent with the right arm. Gait was not tested.  Deep tendon reflexes: Deep tendon reflexes are symmetric throughout. Toes are downgoing bilaterally.  Cranial Nerve Exam: Facial symmetry is present. Extraocular movements are full. Prominent coarse nystagmus is seen with horizontal gaze. Visual fields are full to threat.     Lab Results:  [facility] 12/19/10 0445  WBC 7.0  HGB 8.9*  HCT 28.2*  PLT 317   BMET  Basename 12/21/10 0441 12/19/10 0445  NA 148* 145  K 3.0* 3.0*  CL 105 104  CO2 35* 32  GLUCOSE 196* 165*  BUN 15 15  CREATININE 0.86 0.87  CALCIUM 9.5 9.2     Studies/Results: No results found.  Medications:  Scheduled:   . antiseptic oral rinse  15 mL Mouth Rinse QID  . carvedilol  25 mg Per Tube BID WC  . chlorhexidine  15 mL Mouth Rinse BID  . diltiazem  90 mg Per Tube Q6H  . free water  200 mL Per Tube Q8H  . insulin aspart  0-20 Units Subcutaneous Q4H  . insulin aspart  4 Units Subcutaneous Q4H  . insulin glargine  20 Units Subcutaneous BID  . isosorbide-hydrALAZINE  2 tablet Oral TID  . pantoprazole sodium  40 mg Per Tube Q1200  . potassium chloride  60 mEq Per Tube Once  . potassium chloride  20 mEq Oral Daily  . ramipril  10 mg Per Tube BID  . rosuvastatin  5 mg Oral q1800  . sodium chloride  10 mL Intracatheter Q12H  . DISCONTD: isosorbide-hydrALAZINE  1 tablet Oral TID   Continuous:   . sodium chloride    . feeding supplement (JEVITY 1.2) 1,000 mL (12/21/10 0207)  . DISCONTD: sodium chloride 20 mL/hr (12/20/10 1727)   ZOX:WRUEAVWUJWJXB (TYLENOL) oral liquid 160 mg/5 mL, ALPRAZolam, bisacodyl, labetalol, metoprolol  Assessment/Plan:  Status post right hemorrhagic cerebellar infarct  Status post vent dependent respiratory failure now status post trach /PEG tube  Nonischemic cardiomyopathy  Status post embolic CVA  Chronic A. fib  Uncontrolled hypertension  Hypokalemia  Clinically, the patient appears to be stable. The patient has a  significant right hemi-ataxia, currently with a tracheostomy and PEG tube in place. A rehabilitation consult has been obtained. The patient was living alone prior to admission, but the family indicates that they will take care of him outside of the hospital. Ongoing supportive care at this time. The patient is off of anticoagulant medications and antiplatelet medications.     LOS: 32 days   WILLIS,CHARLES KEITH 12/21/2010, 2:49 PM

## 2010-12-21 NOTE — Progress Notes (Signed)
Subjective:  Patient denies any chest pain or shortness of breath Tolerating trach collar her okay her sats 99% Blood pressure still mildly elevated heart rate well controlled Remains in atrial fibrillation Objective:  Vital Signs in the last 24 hours: Temp:  [97.6 F (36.4 C)-98.7 F (37.1 C)] 98.2 F (36.8 C) (12/09 0802) Pulse Rate:  [25-101] 83  (12/09 1100) Resp:  [14-22] 16  (12/09 1100) BP: (112-167)/(75-119) 145/98 mmHg (12/09 0823) SpO2:  [94 %-100 %] 100 % (12/09 1100) FiO2 (%):  [28 %] 28 % (12/09 0823) Weight:  [91 kg (200 lb 9.9 oz)] 200 lb 9.9 oz (91 kg) (12/09 0500)  Intake/Output from previous day: 12/08 0701 - 12/09 0700 In: 2325 [I.V.:480; NG/GT:705] Out: 1201 [Urine:1140; Drains:60; Stool:1] Intake/Output from this shift: Total I/O In: -  Out: 1600 [Urine:1600]  Physical Exam: Lungs: clear to auscultation bilaterally Heart: irregularly irregular rhythm, S1, S2 normal and no S3 or S4 Abdomen: soft, non-tender; bowel sounds normal; no masses,  no organomegaly Extremities: extremities normal, atraumatic, no cyanosis or edema  Lab Results:  Basename 12/19/10 0445  WBC 7.0  HGB 8.9*  PLT 317    Basename 12/21/10 0441 12/19/10 0445  NA 148* 145  K 3.0* 3.0*  CL 105 104  CO2 35* 32  GLUCOSE 196* 165*  BUN 15 15  CREATININE 0.86 0.87   No results found for this basename: TROPONINI:2,CK,MB:2 in the last 72 hours Hepatic Function Panel No results found for this basename: PROT,ALBUMIN,AST,ALT,ALKPHOS,BILITOT,BILIDIR,IBILI in the last 72 hours No results found for this basename: CHOL in the last 72 hours No results found for this basename: PROTIME in the last 72 hours  Imaging: Imaging results have been reviewed  Cardiac Studies:  Assessment/Plan:  Status post right hemorrhagic cerebellar infarct Status post vent dependent respiratory failure now status post trach /PEG tube Nonischemic cardiomyopathy Status post embolic CVA Chronic A. fib    Uncontrolled hypertension Hypokalemia Plan Replace K. Check BMP and mag in a.m. Increased by bidil per orders  LOS: 32 days    Famous Eisenhardt N 12/21/2010, 11:45 AM

## 2010-12-21 NOTE — Progress Notes (Signed)
Subjective: Awake, nods approp to questions.  No increase wob.  Objective: Vital signs in last 24 hours: Blood pressure 145/98, pulse 83, temperature 98.2 F (36.8 C), temperature source Oral, resp. rate 16, height 6' (1.829 m), weight 91 kg (200 lb 9.9 oz), SpO2 100.00%.  Intake/Output from previous day: 12/08 0701 - 12/09 0700 In: 2325 [I.V.:480; NG/GT:705] Out: 1201 [Urine:1140; Drains:60; Stool:1]   Physical Exam:   wd male in nad Chest totally clear Cor with irreg, cvr abd mildly protuberant, nt, bs+ Alert and appropriate   Lab Results:  [facility] 12/19/10 0445  WBC 7.0  HGB 8.9*  HCT 28.2*  PLT 317   BMET  Basename 12/21/10 0441 12/19/10 0445  NA 148* 145  K 3.0* 3.0*  CL 105 104  CO2 35* 32  GLUCOSE 196* 165*  BUN 15 15  CREATININE 0.86 0.87  CALCIUM 9.5 9.2    Studies/Results: No results found.  Assessment/Plan: Patient Active Hospital Problem List:  Stroke (11/19/2010)   Assessment: pt awake and appropriate.  Being followed by neuro  Diabetes mellitus (11/19/2010)   Assessment: cbg's ok, but a little high at times.   Plan: continue to monitor  Respiratory failure (11/19/2010)   Assessment: doing well on TC.   Hypernatremia Will start free water per tube, and change kvo fluid to 1/2 NS Recheck labs in am.  K being replaced.    Barbaraann Share, M.D. 12/21/2010, 12:02 PM

## 2010-12-21 NOTE — Plan of Care (Signed)
Problem: Phase I Progression Outcomes Goal: Pain controlled with appropriate interventions Outcome: Progressing Pain in abdomen last 24 hours Goal: Voiding-avoid urinary catheter unless indicated Outcome: Completed/Met Date Met:  12/21/10 Using condom cath Goal: Hemodynamically stable Outcome: Progressing Remains in AFIB-rate controlled with diltiazem-long "pauses after drug given"; HTN not fully controlled  Problem: Phase II Progression Outcomes Goal: Discharge plan established Outcome: Progressing Want pt to go to inpatient Rehab

## 2010-12-22 ENCOUNTER — Inpatient Hospital Stay (HOSPITAL_COMMUNITY)
Admission: RE | Admit: 2010-12-22 | Discharge: 2011-01-19 | DRG: 945 | Disposition: A | Discharge status: Home / Self Care | Payer: 59 | Source: Ambulatory Visit | Attending: Physical Medicine & Rehabilitation | Admitting: Physical Medicine & Rehabilitation

## 2010-12-22 ENCOUNTER — Encounter (HOSPITAL_COMMUNITY): Payer: Self-pay | Admitting: Acute Care

## 2010-12-22 DIAGNOSIS — R5381 Other malaise: Secondary | ICD-10-CM

## 2010-12-22 DIAGNOSIS — I6340 Cerebral infarction due to embolism of unspecified cerebral artery: Secondary | ICD-10-CM | POA: Diagnosis present

## 2010-12-22 DIAGNOSIS — M109 Gout, unspecified: Secondary | ICD-10-CM | POA: Diagnosis present

## 2010-12-22 DIAGNOSIS — I619 Nontraumatic intracerebral hemorrhage, unspecified: Secondary | ICD-10-CM | POA: Diagnosis not present

## 2010-12-22 DIAGNOSIS — Z79899 Other long term (current) drug therapy: Secondary | ICD-10-CM

## 2010-12-22 DIAGNOSIS — J449 Chronic obstructive pulmonary disease, unspecified: Secondary | ICD-10-CM | POA: Diagnosis present

## 2010-12-22 DIAGNOSIS — J9600 Acute respiratory failure, unspecified whether with hypoxia or hypercapnia: Secondary | ICD-10-CM

## 2010-12-22 DIAGNOSIS — Z794 Long term (current) use of insulin: Secondary | ICD-10-CM

## 2010-12-22 DIAGNOSIS — Z8673 Personal history of transient ischemic attack (TIA), and cerebral infarction without residual deficits: Secondary | ICD-10-CM | POA: Diagnosis present

## 2010-12-22 DIAGNOSIS — E876 Hypokalemia: Secondary | ICD-10-CM | POA: Diagnosis present

## 2010-12-22 DIAGNOSIS — Z87891 Personal history of nicotine dependence: Secondary | ICD-10-CM

## 2010-12-22 DIAGNOSIS — Z930 Tracheostomy status: Secondary | ICD-10-CM

## 2010-12-22 DIAGNOSIS — E119 Type 2 diabetes mellitus without complications: Secondary | ICD-10-CM | POA: Diagnosis present

## 2010-12-22 DIAGNOSIS — D62 Acute posthemorrhagic anemia: Secondary | ICD-10-CM | POA: Diagnosis present

## 2010-12-22 DIAGNOSIS — Z5189 Encounter for other specified aftercare: Principal | ICD-10-CM

## 2010-12-22 DIAGNOSIS — I4891 Unspecified atrial fibrillation: Secondary | ICD-10-CM | POA: Diagnosis present

## 2010-12-22 DIAGNOSIS — I10 Essential (primary) hypertension: Secondary | ICD-10-CM | POA: Diagnosis present

## 2010-12-22 DIAGNOSIS — Z931 Gastrostomy status: Secondary | ICD-10-CM

## 2010-12-22 DIAGNOSIS — J4489 Other specified chronic obstructive pulmonary disease: Secondary | ICD-10-CM | POA: Diagnosis present

## 2010-12-22 DIAGNOSIS — I639 Cerebral infarction, unspecified: Secondary | ICD-10-CM | POA: Diagnosis present

## 2010-12-22 DIAGNOSIS — Z7982 Long term (current) use of aspirin: Secondary | ICD-10-CM

## 2010-12-22 HISTORY — DX: Nontraumatic intracerebral hemorrhage, unspecified: I61.9

## 2010-12-22 HISTORY — DX: Other malaise: R53.81

## 2010-12-22 LAB — GLUCOSE, CAPILLARY
Glucose-Capillary: 132 mg/dL — ABNORMAL HIGH (ref 70–99)
Glucose-Capillary: 110 mg/dL — ABNORMAL HIGH (ref 70–99)
Glucose-Capillary: 119 mg/dL — ABNORMAL HIGH (ref 70–99)

## 2010-12-22 LAB — BASIC METABOLIC PANEL
Chloride: 105 mEq/L (ref 96–112)
Creatinine, Ser: 0.95 mg/dL (ref 0.50–1.35)
GFR calc Af Amer: >90 mL/min (ref >90)
GFR calc non Af Amer: >90 mL/min (ref >90)

## 2010-12-22 LAB — URINE MICROSCOPIC-ADD ON

## 2010-12-22 LAB — URINALYSIS, ROUTINE W REFLEX MICROSCOPIC
Ketones, ur: NEGATIVE mg/dL
Leukocytes, UA: NEGATIVE
Nitrite: NEGATIVE
Protein, ur: 30 mg/dL — AB
Urobilinogen, UA: 1 mg/dL (ref 0.0–1.0)

## 2010-12-22 LAB — CBC
Hemoglobin: 8.4 g/dL — ABNORMAL LOW (ref 13.0–17.0)
MCH: 28.4 pg (ref 26.0–34.0)
MCHC: 31.7 g/dL (ref 30.0–36.0)
Platelets: 301 10*3/uL (ref 150–400)
RDW: 14.5 % (ref 11.5–15.5)

## 2010-12-22 LAB — MAGNESIUM: Magnesium: 2.1 mg/dL (ref 1.5–2.5)

## 2010-12-22 MED ORDER — TRAZODONE HCL 50 MG PO TABS
25.0000 mg | ORAL_TABLET | Freq: Every evening | ORAL | Status: DC | PRN
  Administered 2010-12-27: 50 mg via ORAL
  Administered 2011-01-13: 25 mg via ORAL
  Filled 2010-12-22 (×2): qty 1

## 2010-12-22 MED ORDER — ISOSORB DINITRATE-HYDRALAZINE 20-37.5 MG PO TABS
2.0000 | ORAL_TABLET | Freq: Three times a day (TID) | ORAL | Status: DC
  Administered 2010-12-22 – 2011-01-19 (×82): 2 via ORAL
  Filled 2010-12-22 (×90): qty 2

## 2010-12-22 MED ORDER — JEVITY 1.2 CAL PO LIQD
1000.0000 mL | ORAL | Status: DC
  Filled 2010-12-22 (×3): qty 1000

## 2010-12-22 MED ORDER — INSULIN ASPART 100 UNIT/ML ~~LOC~~ SOLN
4.0000 [IU] | SUBCUTANEOUS | Status: DC
  Administered 2010-12-23: 4 [IU] via SUBCUTANEOUS

## 2010-12-22 MED ORDER — DIPHENHYDRAMINE HCL 12.5 MG/5ML PO ELIX
12.5000 mg | ORAL_SOLUTION | Freq: Four times a day (QID) | ORAL | Status: DC | PRN
Start: 2010-12-22 — End: 2011-01-19

## 2010-12-22 MED ORDER — PROMETHAZINE HCL 12.5 MG RE SUPP: 12.5000 mg | Freq: Four times a day (QID) | RECTAL | Status: DC | PRN

## 2010-12-22 MED ORDER — INSULIN GLARGINE 100 UNIT/ML ~~LOC~~ SOLN
20.0000 [IU] | Freq: Two times a day (BID) | SUBCUTANEOUS | Status: DC
  Administered 2010-12-22: 20 [IU] via SUBCUTANEOUS
  Filled 2010-12-22: qty 3

## 2010-12-22 MED ORDER — FREE WATER: 200.0000 mL | Freq: Three times a day (TID) | Status: DC

## 2010-12-22 MED ORDER — INSULIN GLARGINE 100 UNIT/ML ~~LOC~~ SOLN: 20.0000 [IU] | Freq: Two times a day (BID) | SUBCUTANEOUS | Status: DC

## 2010-12-22 MED ORDER — SODIUM CHLORIDE 0.9 % IJ SOLN: 10.0000 mL | Freq: Two times a day (BID) | INTRAMUSCULAR | Status: DC

## 2010-12-22 MED ORDER — GUAIFENESIN-DM 100-10 MG/5ML PO SYRP
5.0000 mL | ORAL_SOLUTION | Freq: Four times a day (QID) | ORAL | Status: DC | PRN
  Administered 2011-01-05: 10 mL via ORAL
  Filled 2010-12-22: qty 10

## 2010-12-22 MED ORDER — ALPRAZOLAM 0.25 MG PO TABS: 0.2500 mg | ORAL_TABLET | Freq: Two times a day (BID) | ORAL | Status: DC | PRN

## 2010-12-22 MED ORDER — PANTOPRAZOLE SODIUM 40 MG PO PACK: 40.0000 mg | PACK | Freq: Every day | ORAL | Status: DC

## 2010-12-22 MED ORDER — INSULIN ASPART 100 UNIT/ML ~~LOC~~ SOLN: 0.0000 [IU] | SUBCUTANEOUS | Status: DC

## 2010-12-22 MED ORDER — FREE WATER
200.0000 mL | Freq: Three times a day (TID) | Status: DC
  Administered 2010-12-22: 200 mL
  Filled 2010-12-22 (×3): qty 200

## 2010-12-22 MED ORDER — PROMETHAZINE HCL 25 MG/ML IJ SOLN: 12.5000 mg | Freq: Four times a day (QID) | INTRAMUSCULAR | Status: DC | PRN

## 2010-12-22 MED ORDER — ACETAMINOPHEN 325 MG PO TABS
325.0000 mg | ORAL_TABLET | ORAL | Status: DC | PRN
  Administered 2010-12-26 – 2011-01-14 (×10): 650 mg via ORAL
  Filled 2010-12-22 (×10): qty 2

## 2010-12-22 MED ORDER — INSULIN ASPART 100 UNIT/ML ~~LOC~~ SOLN
0.0000 [IU] | SUBCUTANEOUS | Status: DC
  Administered 2010-12-23 (×3): 3 [IU] via SUBCUTANEOUS
  Administered 2010-12-24 (×2): 4 [IU] via SUBCUTANEOUS
  Administered 2010-12-24: 3 [IU] via SUBCUTANEOUS
  Filled 2010-12-22: qty 3

## 2010-12-22 MED ORDER — POTASSIUM CHLORIDE CRYS ER 20 MEQ PO TBCR
20.0000 meq | EXTENDED_RELEASE_TABLET | Freq: Every day | ORAL | Status: DC
  Filled 2010-12-22 (×2): qty 1

## 2010-12-22 MED ORDER — ROSUVASTATIN CALCIUM 5 MG PO TABS
5.0000 mg | ORAL_TABLET | Freq: Every day | ORAL | Status: DC
  Administered 2010-12-22 – 2011-01-18 (×28): 5 mg via ORAL
  Filled 2010-12-22 (×29): qty 1

## 2010-12-22 MED ORDER — ALPRAZOLAM 0.25 MG PO TABS
0.2500 mg | ORAL_TABLET | Freq: Two times a day (BID) | ORAL | Status: DC | PRN
  Administered 2010-12-22 – 2011-01-16 (×4): 0.25 mg via ORAL
  Filled 2010-12-22 (×5): qty 1

## 2010-12-22 MED ORDER — RAMIPRIL 10 MG PO CAPS: 10.0000 mg | ORAL_CAPSULE | Freq: Two times a day (BID) | ORAL | Status: DC

## 2010-12-22 MED ORDER — SODIUM CHLORIDE 0.45 % IV SOLN: 1000.0000 mL | INTRAVENOUS | Status: DC

## 2010-12-22 MED ORDER — ROSUVASTATIN CALCIUM 5 MG PO TABS: 5.0000 mg | ORAL_TABLET | Freq: Every day | ORAL | Status: DC

## 2010-12-22 MED ORDER — ACETAMINOPHEN 160 MG/5ML PO SOLN: 650.0000 mg | ORAL | Status: AC | PRN

## 2010-12-22 MED ORDER — ALUM & MAG HYDROXIDE-SIMETH 400-400-40 MG/5ML PO SUSP: 30.0000 mL | ORAL | Status: DC | PRN

## 2010-12-22 MED ORDER — PANTOPRAZOLE SODIUM 40 MG PO PACK
40.0000 mg | PACK | Freq: Every day | ORAL | Status: DC
  Administered 2010-12-23 – 2010-12-24 (×2): 40 mg
  Filled 2010-12-22 (×3): qty 20

## 2010-12-22 MED ORDER — POLYETHYLENE GLYCOL 3350 17 G PO PACK
17.0000 g | PACK | Freq: Every day | ORAL | Status: DC | PRN
  Administered 2011-01-11: 17 g via ORAL
  Filled 2010-12-22: qty 1

## 2010-12-22 MED ORDER — DILTIAZEM 12 MG/ML ORAL SUSPENSION: ORAL | Status: DC

## 2010-12-22 MED ORDER — DEXTROSE 50 % IV SOLN
INTRAVENOUS | Status: AC
  Administered 2010-12-22: 25 mL via INTRAVENOUS
  Filled 2010-12-22: qty 50

## 2010-12-22 MED ORDER — INSULIN ASPART 100 UNIT/ML ~~LOC~~ SOLN: 4.0000 [IU] | SUBCUTANEOUS | Status: DC

## 2010-12-22 MED ORDER — ALUM & MAG HYDROXIDE-SIMETH 200-200-20 MG/5ML PO SUSP: 30.0000 mL | ORAL | Status: DC | PRN

## 2010-12-22 MED ORDER — JEVITY 1.2 CAL PO LIQD: 1000.0000 mL | ORAL | Status: DC

## 2010-12-22 MED ORDER — PROMETHAZINE HCL 12.5 MG PO TABS: 12.5000 mg | ORAL_TABLET | Freq: Four times a day (QID) | ORAL | Status: DC | PRN

## 2010-12-22 MED ORDER — FLEET ENEMA 7-19 GM/118ML RE ENEM: 1.0000 | ENEMA | Freq: Once | RECTAL | Status: AC | PRN

## 2010-12-22 MED ORDER — POTASSIUM CHLORIDE 20 MEQ/15ML (10%) PO LIQD
40.0000 meq | Freq: Two times a day (BID) | ORAL | Status: DC
  Administered 2010-12-22: 40 meq via ORAL
  Filled 2010-12-22 (×4): qty 30

## 2010-12-22 MED ORDER — BISACODYL 10 MG RE SUPP
10.0000 mg | Freq: Every day | RECTAL | Status: DC | PRN
  Administered 2010-12-28 – 2010-12-29 (×2): 10 mg via RECTAL
  Filled 2010-12-22 (×3): qty 1

## 2010-12-22 MED ORDER — DILTIAZEM 12 MG/ML ORAL SUSPENSION
90.0000 mg | Freq: Four times a day (QID) | ORAL | Status: DC
  Administered 2010-12-22 – 2010-12-24 (×8): 90 mg
  Filled 2010-12-22 (×11): qty 9

## 2010-12-22 MED ORDER — RAMIPRIL 10 MG PO CAPS
10.0000 mg | ORAL_CAPSULE | Freq: Two times a day (BID) | ORAL | Status: DC
  Administered 2010-12-22 – 2010-12-24 (×4): 10 mg
  Filled 2010-12-22 (×6): qty 1

## 2010-12-22 MED ORDER — FREE WATER
200.0000 mL | Freq: Three times a day (TID) | Status: DC
  Administered 2010-12-22 – 2010-12-23 (×2): 200 mL
  Filled 2010-12-22 (×5): qty 200

## 2010-12-22 MED ORDER — CARVEDILOL 25 MG PO TABS
25.0000 mg | ORAL_TABLET | Freq: Two times a day (BID) | ORAL | Status: DC
  Administered 2010-12-23 – 2010-12-24 (×4): 25 mg
  Filled 2010-12-22 (×5): qty 1

## 2010-12-22 MED ORDER — BISACODYL 10 MG RE SUPP: 10.0000 mg | Freq: Every day | RECTAL | Status: DC | PRN

## 2010-12-22 MED ORDER — POTASSIUM CHLORIDE CRYS ER 20 MEQ PO TBCR
40.0000 meq | EXTENDED_RELEASE_TABLET | Freq: Two times a day (BID) | ORAL | Status: DC
  Filled 2010-12-22 (×2): qty 2

## 2010-12-22 NOTE — Progress Notes (Signed)
   Final critical care Note.   [name] is a 55 y.o. male former smoker admitted on 11/19/2010 with acute onset of slurred speech and Lt sided weakness. Dx Cerebellar ischemic cva then converted to bleed. Transferred to SDU 11/24 but returned to ICU 11/25 with decreased LOC which resolved with reinitiation of mechanical ventilation.    Subjective: Wants to drink.   Objective: Vital signs in last 24 hours: Temp:  [97.7 F (36.5 C)-99.3 F (37.4 C)] 97.9 F (36.6 C) (12/10 0807) Pulse Rate:  [72-104] 87  (12/10 0807) Resp:  [16-24] 22  (12/10 0807) BP: (134-171)/(87-109) 161/104 mmHg (12/10 0932) SpO2:  [95 %-100 %] 100 % (12/10 0807) FiO2 (%):  [28 %] 28 % (12/10 0807) Weight:  [90 kg (198 lb 6.6 oz)] 198 lb 6.6 oz (90 kg) (12/10 0500)  Exam- Gen- drowsy today. No distress.  Skin- no rash Neck- #8 trach midline, c/d/i Neuro-Awke, no distress. Moves all ext. Right sided weakness.  Lungs- clear. No bleeding Heart- AFib, distant tones Abd- obese, distened, decreased bowel sounds  PEG.  Lab Results:  Basename 12/22/10 0500  WBC 6.1  HGB 8.4*  HCT 26.5*  PLT 301   BMET  Basename 12/22/10 0500 12/21/10 0441  NA 146* 148*  K 2.8* 3.0*  CL 105 105  CO2 32 35*  GLUCOSE 107* 196*  BUN 17 15  CREATININE 0.95 0.86  CALCIUM 9.4 9.5    Studies/Results: No results found.  Medications: Medications reviewed.   Assessment/Plan: 1) CVA- cerebellar ischemic infarct w. Hemorrhagic conversion.  Plan: - per stoke team, no changes - for in-pt rehab  2) Acute resp failure- continues trach collar looks good. Initial trach done by Pollyann Kennedy.  Plan: -cont pulm hygiene -wean fio2 -down size to # 6 today  3) tracheal site bleeding. Now resolved after LMWH stopped.   4) Afib- rate controlled.  Plan: -rate control only, no asa or LMWH given recent trach site  bleeding  4)HTN Plan: -cont current rx  5) Hypernatremia  Lab 12/22/10 0500 12/21/10 0441 12/19/10 0445  NA  146* 148* 145  plan: -free water -recheck NA  6) DM - controlled on tube feed well  Lab 12/22/10 0808 12/22/10 0423 12/21/10 2350 12/21/10 2018 12/21/10 1707  GLUCAP 118* 140* 149* 120* 135*  plan: -ssi  7) anemia  Lab 12/22/10 0500 12/19/10 0445 12/17/10 0415  HGB 8.4* 8.9* 9.2*  plan: -trend, no xfusion unless hgb <7 -no more LMWH or asa.   8) Disposition - family would like CIR and then hopeful for transition to home.  Brother ok for patient to go home with him after discharge.  -hope that downsized trach can facilitate swallow eval and PMV trials.     LOS: 33 days   BABCOCK,PETE, M.D. [phone]  Reviewed above and examined pt.  Will change to #6 trach.  Further trach care can be arranged with ENT.  Will transfer service to [facility] hospitalist 12/11 and PCCM will sign off.  Gena Laski 12/22/2010, 12:22 PM Pager:  [phone]

## 2010-12-22 NOTE — Progress Notes (Signed)
BG check 62, administered D50 25 mg per hypoglycemic protocol. Recheck 110. Pt asymptomatic.

## 2010-12-22 NOTE — Progress Notes (Signed)
Nutrition Follow-up  Diet Order:  Jevity 1.2 @ 60 ml/hr via PEG, free water 200 ml q 8 hrs.   Provides 1440 ml, 1728 kcal, 80 gm protein, 1162 ml free water without flushes, 1762 ml free water with flushes.  Meds: Scheduled Meds:   . antiseptic oral rinse  15 mL Mouth Rinse QID  . carvedilol  25 mg Per Tube BID WC  . chlorhexidine  15 mL Mouth Rinse BID  . diltiazem  90 mg Per Tube Q6H  . free water  200 mL Per Tube Q8H  . insulin aspart  0-20 Units Subcutaneous Q4H  . insulin aspart  4 Units Subcutaneous Q4H  . insulin glargine  20 Units Subcutaneous BID  . isosorbide-hydrALAZINE  2 tablet Oral TID  . pantoprazole sodium  40 mg Per Tube Q1200  . potassium chloride  20 mEq Oral Daily  . ramipril  10 mg Per Tube BID  . rosuvastatin  5 mg Oral q1800  . sodium chloride  10 mL Intracatheter Q12H  . DISCONTD: isosorbide-hydrALAZINE  1 tablet Oral TID   Continuous Infusions:   . sodium chloride 20 mL/hr (12/21/10 2030)  . feeding supplement (JEVITY 1.2) 1,000 mL (12/21/10 2310)  . DISCONTD: sodium chloride 20 mL/hr (12/20/10 1727)   PRN Meds:acetaminophen (TYLENOL) oral liquid 160 mg/5 mL, ALPRAZolam, bisacodyl, labetalol, metoprolol  Labs:  CMP     Component Value Date/Time   NA 146* 12/22/2010 0500   K 2.8* 12/22/2010 0500   CL 105 12/22/2010 0500   CO2 32 12/22/2010 0500   GLUCOSE 107* 12/22/2010 0500   BUN 17 12/22/2010 0500   CREATININE 0.95 12/22/2010 0500   CALCIUM 9.4 12/22/2010 0500   PROT 7.5 12/02/2010 0545   ALBUMIN 2.4* 12/02/2010 0545   AST 22 12/02/2010 0545   ALT 24 12/02/2010 0545   ALKPHOS 65 12/02/2010 0545   BILITOT 0.3 12/02/2010 0545   GFRNONAA >90 12/22/2010 0500   GFRAA >90 12/22/2010 0500   CBG (last 3)   Basename 12/22/10 0808 12/22/10 0423 12/21/10 2350  GLUCAP 118* 140* 149*   A1c 8.2 11/20/10, 8.9 01/26/10   Intake/Output Summary (Last 24 hours) at 12/22/10 1034 Last data filed at 12/22/10 1610  Gross per 24 hour  Intake   2660 ml    Output    461 ml  Net   2199 ml   Re-estimated nutrition needs:  1772-1949 kcal/day, 80-96 gm protein daily.  Current regimen meets 98% minimum estimated energy needs and 100% minimum protein needs.  Weight Status:  198 lb (90 kg) 12/22/10 (range 198 lb-220 lb past one month) BMI recalculated as 26.9 kg consistent with overweight  Nutrition Dx:  Inadequate oral intake, ongoing  Goal:  EN regimen to meet >/= 90% minimum estimated nutrition needs, met.  Intervention:  Continue current nutrition care plan at this time.  Monitor:  TF tolerance and adequacy, weight trend   Otto Herb Pager #:  [phone]

## 2010-12-22 NOTE — Progress Notes (Signed)
Overall Plan of Care [facility]) Patient Details Name: [name] MRN: [id number] DOB: 1955/06/20  Diagnosis:  Rehab for CVA  Primary Diagnosis:    <principal problem not specified> Co-morbidities: A-fib, CAD, HTN, GOUT, CHF, DM, Resp failure, Angina, Pulmonary Edema, Peg  Functional Problem List  Patient demonstrates impairments in the following areas: Balance, Bladder, Bowel, Cognition, Edema, Endurance, Medication Management, Motor, Nutrition, Perception, Safety, Sensory  and Vision  Basic ADL's: eating, grooming, bathing, dressing and toileting Advanced ADL's: simple meal preparation and light housekeeping  Transfers:  bed mobility, bed to chair, toilet, tub/shower, car, furniture and floor Locomotion:  ambulation, wheelchair mobility and stairs  Additional Impairments:  Functional use of upper extremity, Swallowing, Communication  expression and Discharge Disposition  Anticipated Outcomes Item Anticipated Outcome  Eating/Swallowing  Supervision  Basic self-care  Min assist overall, supervision grooming and UB  Tolieting    Bowel/Bladder  Pt will be continent of bowel and bladder.  Transfers  Min assist  Locomotion  Supervision W/C mobility, min assist short distance gait  Communication  Supervision  Cognition  Supervision  Pain  Pain Level kept at tolerable level for patient comfort  Safety/Judgment  Pt will not have any injuries from falls while hospitalized  Other     Therapy Plan:  OT: 1-2x/day 60-90 mins  PT: 2-3x/day, 60-90 minutes SLP: 1-2x/day 60-90 mins 5 out of 7 days     Team Interventions: Item RN PT OT SLP SW TR Other  Self Care/Advanced ADL Retraining  x x      Neuromuscular Re-Education  x x      Therapeutic Activities  x x x  x   UE/LE Strength Training/ROM  x x   x   UE/LE Coordination Activities  x x   x   Visual/Perceptual Remediation/Compensation  x x   x   DME/Adaptive Equipment Instruction  x x   x   Therapeutic Exercise  x x x  x    Balance/Vestibular Training  x x   x   Patient/Family Education  x x x  x   Cognitive Remediation/Compensation  x x x  x   Functional Mobility Training  x x   x   Ambulation/Gait Training  x       Stair Training  x       Wheelchair Propulsion/Positioning  x       Functional Tourist information centre manager Reintegration  x    x   Dysphagia/Aspiration Precaution Training    x     Speech/Language Facilitation    x     Bladder Management x        Bowel Management x        Disease Management/Prevention x        Pain Management x        Medication Management x        Skin Care/Wound Management x        Splinting/Orthotics  x       Discharge Planning      x   Psychosocial Support      x                      Team Discharge Planning: Destination:  Home with 24/7 min assist Projected Follow-up:  OT and Home Health, PT Home Health, SLP Home Health Projected Equipment Needs:  Wheelchair Patient/family involved in discharge planning:  Yes  MD ELOS: 4  wks Medical Rehab Prognosis:  Good Assessment: Severe ataxia anticipate slow recovery

## 2010-12-22 NOTE — Progress Notes (Signed)
Speech Pathology:  Treatment Note  Subjective:  Patient alert, cooperative  Objective:  Patient agreeable to PMSV trials. #8.0 cuffed shiley trach remains in place. Cuff deflated upon arrival with good tolerance. O2-100%, RR low 20s, HR- low 100s at baseline. NO change in vital signs noted during trials however patient presented with rapid fatigue, CO2 trapping, and hard coughing noted consistently after 2-3 minutes of placement. Audible clear phonation noted in 2-4 attempts to phonate. PMSV removed prior to leaving room. Suspect that large size of trach combined with some decreased breath support contributing to inability to tolerate.   Assessment:  Continues to present with decreased upper airway patency resulting in decreased ability to tolerate PMSV for improved verbal communication and swallow.   Recommendations:  1.  SLP will f/u 2.  MD, can trach be downsized to facilitate use of PMSV??   Pain:   none Intervention Required:   No   Goals: Not progressing secondary to large trach size.   Ferdinand Lango MA, CCC-SLP [phone]

## 2010-12-22 NOTE — Plan of Care (Signed)
Problem: Inadequate Intake (NI-2.1) Goal: Food and/or nutrient delivery Individualized approach for food/nutrient provision.  Outcome: Progressing Although patient continues to be NPO without oral intake, he is meeting nutrition needs with TFs.

## 2010-12-22 NOTE — H&P (Signed)
Physical Medicine and Rehabilitation Admission H&P Chief Complaint  Patient presents with  . Cerebrovascular Accident  : HPI: [name] is an 55 y.o. male with H/O COPD, DM, A fib, admitted 11/19/2010 with acute onset of slurred speech and left sided weakness. CT head without acute abnormality. Patient became diaphoretic with increased WOB with inability to speak, LUE flaccidity while in Xray. He was intubated for airway support. CT angio chest without PE and evidence of CHF. CTA head and neck without thrombus, distal left vertebral artery stenosis and basilar artery stenosis. MRI brain with acute subacute infarct right SCA territory including mid brain and upper pons. 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 Dr Phoebe Perch consulted and recommended reversing coumadin and heparin.  On 11/10 patient, had ventriculostomy placed with good drainage of CSF. Patient comatose and unresponsive. Patient trached by Dr Pollyann Kennedy on 11/16 and PEG placed 11/21 by Dr. Juanda Chance. Developed fevers and diarrhea, started on flagyl for ? C diff. Ventilator wean initiated On, 11/24 with worsening of MS and repeat CT head with slight increase in hemorrhage and ?new right parietal infarct and patient reintubated. Dr Phoebe Perch consulted and no surgical intervention recommended. Vent wean initiated 11/28 and patient tolerated extubation. Noted to have blood in stools with ?lower GIB. GI recommended serial hemoglobin checks. Acute stomal bleeding on12/3 treated with change and placement of #8 cuffed trach. Blood pressures remain labile and A Fib treated with IV Cardizem.  Dr [name] consulted for input on BP/afib management and recommended adding labetalol and ace inhibitors for better control. PMSV trials ongoing and patient with difficulty tolerating trials due to  #8.  He was downsized to a #6 cuffed trach today.  ROS: Patient denies any chest pain or shortness of breath or pain today. Other pertinent positive for above in the history of present illness. Patient's a poor historian given his language and cognitive deficits.  Past Medical History  Diagnosis Date  . Diabetes mellitus   . Coronary artery disease   . Hypertension   . Gout   . CHF (congestive heart failure)   . Afib   . Arthritis   . Hypertensive emergency 11/19/2010  . Pulmonary edema 11/19/2010  . Acute exacerbation of congestive heart failure 11/19/2010  . Thyroiditis 11/20/2010  . ICH (intracerebral hemorrhage) 12/22/2010  . Stroke 11/19/2010  . Respiratory failure 11/19/2010  . CAD (coronary artery disease) 11/20/2010  . Physical deconditioning 12/22/2010  . Diabetes mellitus 11/19/2010  . A-fib 11/19/2010  . Hyperlipemia 11/21/2010   Past Surgical History  Procedure Date  . Tracheostomy tube placement 11/28/2010    Procedure: TRACHEOSTOMY;  Surgeon: Susy Frizzle, MD;  Location: [facility] OR;  Service: ENT;  Laterality: N/A;  . Peg placement 12/03/2010    Procedure: PERCUTANEOUS ENDOSCOPIC GASTROSTOMY (PEG) PLACEMENT;  Surgeon: Hart Carwin, MD;  Location: [facility] ENDOSCOPY;  Service: Endoscopy;  Laterality: N/A;   History reviewed. No pertinent family history.  Social History:  .Single. Retired from [employer] 5 years ago. He reports that he quit smoking about 5 years ago. His smoking use included Cigarettes. He does not have any smokeless tobacco history on file. He reports that he drinks about 6.6 ounces of alcohol per week. He reports that he does not use illicit drugs. Has a brother in [place] who's retired. Maybe able to assist?   Allergies: No Known Allergies  Prior to Admission medications   Medication Sig Start Date End Date Taking? Authorizing Provider  allopurinol (ZYLOPRIM) 300 MG tablet Take 300 mg by mouth daily.     Yes Historical Provider, MD  amLODipine  (NORVASC) 5 MG tablet Take 5 mg by mouth daily.     Yes Historical Provider, MD  AMOXICILLIN PO Take 400 mg by mouth 2 (two) times daily.    Yes Historical Provider, MD  aspirin 81 MG tablet Take 81 mg by mouth daily.     Yes Historical Provider, MD  carvedilol (COREG) 25 MG tablet Take 25 mg by mouth 2 (two) times daily with a meal.     Yes Historical Provider, MD  celecoxib (CELEBREX) 200 MG capsule Take 200 mg by mouth 2 (two) times daily.     Yes Historical Provider, MD  cloNIDine (CATAPRES) 0.1 MG tablet Take 0.1 mg by mouth 2 (two) times daily.     Yes Historical Provider, MD  colchicine 0.6 MG tablet Take 0.6 mg by mouth daily. For gout    Yes Historical Provider, MD  cyclobenzaprine (FLEXERIL) 10 MG tablet Take 5-10 mg by mouth 2 (two) times daily as needed. For muscle spasms    Yes Historical Provider, MD  furosemide (LASIX) 20 MG tablet Take 10-20 mg by mouth 2 (two) times daily. For pressure and fluid    Yes Historical Provider, MD  isosorbide-hydrALAZINE (BIDIL) 20-37.5 MG per tablet Take 1 tablet by mouth 3 (three) times daily.     Yes Historical Provider, MD  metFORMIN (GLUCOPHAGE-XR) 500 MG 24 hr tablet Take 1,000 mg by mouth 2 (two) times daily.     Yes Historical Provider, MD  Multiple Vitamins-Minerals (MULTIVITAMINS THER. W/MINERALS) TABS Take 1 tablet by mouth daily.     Yes Historical Provider, MD  naproxen sodium (ANAPROX) 220 MG tablet Take 220 mg by mouth 2 (two) times daily as needed. For pain    Yes Historical Provider, MD  ramipril (ALTACE) 10 MG capsule Take 10 mg by mouth 2 (two) times daily.     Yes Historical Provider, MD  spironolactone (ALDACTONE) 50 MG tablet Take 50 mg by mouth 2 (two) times daily.     Yes Historical Provider, MD  warfarin (COUMADIN) 7.5 MG tablet Take 7.5 mg by mouth daily.     Yes Historical Provider, MD    Scheduled Medications:    . carvedilol  25 mg Per Tube BID WC  . diltiazem  90 mg Per Tube Q6H  . free water  200 mL Per Tube Q8H  .  insulin aspart  0-20 Units Subcutaneous Q4H  . insulin aspart  4 Units Subcutaneous Q4H  . insulin glargine  20 Units Subcutaneous BID  . isosorbide-hydrALAZINE  2 tablet Oral TID  . pantoprazole sodium  40 mg Per Tube Q1200  . potassium chloride  20 mEq Oral Daily  . ramipril  10 mg Per Tube BID  . rosuvastatin  5 mg Oral q1800  . sodium chloride  10 mL Intracatheter Q12H  . DISCONTD: antiseptic oral rinse  15 mL Mouth Rinse QID  . DISCONTD: chlorhexidine  15 mL Mouth Rinse BID  . DISCONTD: free water  200 mL Per Tube Q8H    PRN MED's: acetaminophen (TYLENOL) oral liquid 160 mg/5 mL, ALPRAZolam, bisacodyl, DISCONTD: labetalol, DISCONTD: metoprolol   Home: Home Living Lives With: Alone Type of Home: House Home Layout: One level Home Access: Stairs to enter Entrance Stairs-Rails: Lawyer of Steps: 3-4 Bathroom  Toilet: Standard Home Adaptive Equipment: Crutches   Functional History: Prior Function Level of Independence: Independent with basic ADLs;Independent with homemaking with ambulation;Independent with gait Driving: Yes  Functional Status:  Mobility: Bed Mobility Bed Mobility: Yes Rolling Right: 1: +2 Total assist;Patient percentage (comment);Other (comment);With rail (pt=40%) Rolling Right Details (indicate cue type and reason): Hand over hand to facilitation to complete roll with upper extremity reach Rolling Left: 3: Mod assist Rolling Left Details (indicate cue type and reason):   Right Sidelying to Sit:  (pt=50%) Right Sidelying to Sit Details (indicate cue type and reason): vc's for hand placement; manual A for truncal control Supine to Sit: 1: +2 Total assist;Patient percentage (comment);HOB flat;Other (comment) (pt =50%) Supine to Sit Details (indicate cue type and reason): vc for technique (to come over to the L elbow); manual A for truncal A Sitting - Scoot to Edge of Bed: 3: Mod assist Transfers Transfers: Yes Sit to Stand: 1:  +2 Total assist;Patient percentage (comment);From bed;With upper extremity assist (pt=45%) Sit to Stand Details (indicate cue type and reason): Max verbal cues for hand placement and technique Stand to Sit: 1: +2 Total assist;Patient percentage (comment);To bed (pt > 50%) Stand Pivot Transfers: 1: +2 Total assist;Patient percentage (comment);From elevated surface;Other (comment) (pt =45%) Stand Pivot Transfer Details (indicate cue type and reason): vc's for hand placement; manual A for forward w/shift and side to side w/shift Squat Pivot Transfers: 1: +2 Total assist;Patient percentage (comment);Other (comment) (pt =>40%) Squat Pivot Transfer Details (indicate cue type and reason): vc's for hand placement; manual A for support and w/shift for pivotal steps Ambulation/Gait Ambulation/Gait: No    ADL: ADL Eating/Feeding: NPO Grooming: Performed;Wash/dry face;Moderate assistance Grooming Details (indicate cue type and reason): Pt. provided with support under rt. elbow and hand over hand facilitation to maintain grasp of wash cloth (due to pt. with ataxia) Where Assessed - Grooming: Unsupported;Sitting, bed (close supervision) Upper Body Bathing: Simulated;Chest;Right arm;Left arm;Abdomen;+1 Total assistance Where Assessed - Upper Body Bathing: Supine, head of bed up Lower Body Bathing: Simulated;+1 Total assistance Where Assessed - Lower Body Bathing: Supine, head of bed up Upper Body Dressing: Performed;Maximal assistance Upper Body Dressing Details (indicate cue type and reason): With donning gown and mod verbal cues for inititation and completiong of task Where Assessed - Upper Body Dressing: Unsupported;Sitting, bed (close supervision) Lower Body Dressing: +1 Total assistance;Performed Lower Body Dressing Details (indicate cue type and reason): Pt. unable to don/doff socks due to decreased functional use of bilateral UE and trunk weakness Where Assessed - Lower Body Dressing: Sit to stand  from bed Toilet Transfer: Simulated;+2 Total assistance;Comment for patient % (pt=40%) Toilet Transfer Details (indicate cue type and reason): Max verbal and hand over hand cues for hand placement and transfer technique Toilet Transfer Method: Stand pivot Toilet Transfer Equipment: Other (comment) Nurse, children's) Toileting - Clothing Manipulation: Not assessed Toileting - Hygiene: Performed;+1 Total assistance Toileting - Hygiene Details (indicate cue type and reason): Pt. incontinent of bowels upon arrival and with rolling Where Assessed - Toileting Hygiene: Rolling right and/or left Tub/Shower Transfer: Not assessed ADL Comments: Pt. completed EOB dynamic balance activities to facilitate increased trunk control and bilateral UE reaching to increase functional use by controlling movements due to Rt. UE ataxia>lt. UE  Cognition: Cognition Arousal/Alertness: Lethargic Orientation Level: Oriented to person;Oriented to place Cognition Arousal/Alertness: Lethargic Overall Cognitive Status: Difficult to assess (Due to pt. with decreased verbalization and arousal) Difficult to assess due to: impaired communication;tracheostomy Orientation Level: Oriented to person;Oriented to place  PHYSICAL EXAM  Blood pressure 120/98, pulse 86, temperature 98.4 F (36.9 C), temperature source Oral, resp. rate 22, height 6' (1.829 m), weight 90 kg (198 lb 6.6 oz), SpO2 100.00%.  Patient sitting comfortably in his chair with #6 Trach in Pl. cuffed. No visible secretions of my evaluation. Ears and throat notable for trach as well as thrush over the tongue. Patient has weak cough with trach in place. We did not occluded this evaluation. He is fair oral motor control. No gross cranial nerve abnormalities I can see on exam today. Patient was able to follow simple one-step commands. He communicated with simple yes no and shakes. He is nonverbal. He had diminished fine motor movement in all 4 limbs. He may have had some  underlying ataxia on the right posterior difficult to discern given his extensive weakness throughout. Strength was grossly 2+ to 3/5 proximally in both upper limbs. Lower extremity grossly 1-2/5 proximal to 2/5 distally. Patient did have sensation to gross pain stimulation. Reflexes are 1+ grossly throughout.  Patient had poor insight and and awareness. Memory is difficult to assess given his language issues although it appeared to be impaired.  Heart was irregularly irregular. Chest was grossly clear with a few scattered rhonchi. Abdomen soft nontender. PEG site was clean and intact with minimal drainage. Skin throughout was generally intact. Mild edema in all 4 limbs at one plus, nonpitting.  All 4 limbs were warm with 2+ pulses.  Results for orders placed during the hospital encounter of 11/19/10 (from the past 48 hour(s))  GLUCOSE, CAPILLARY     Status: Abnormal   Collection Time   12/20/10  4:29 PM      Component Value Range Comment   Glucose-Capillary 127 (*) 70 - 99 (mg/dL)    Comment 1 Documented in Chart      Comment 2 Notify RN     GLUCOSE, CAPILLARY     Status: Abnormal   Collection Time   12/20/10  8:22 PM      Component Value Range Comment   Glucose-Capillary 157 (*) 70 - 99 (mg/dL)   GLUCOSE, CAPILLARY     Status: Abnormal   Collection Time   12/21/10 12:01 AM      Component Value Range Comment   Glucose-Capillary 114 (*) 70 - 99 (mg/dL)   GLUCOSE, CAPILLARY     Status: Abnormal   Collection Time   12/21/10  4:24 AM      Component Value Range Comment   Glucose-Capillary 173 (*) 70 - 99 (mg/dL)   BASIC METABOLIC PANEL     Status: Abnormal   Collection Time   12/21/10  4:41 AM      Component Value Range Comment   Sodium 148 (*) 135 - 145 (mEq/L)    Potassium 3.0 (*) 3.5 - 5.1 (mEq/L)    Chloride 105  96 - 112 (mEq/L)    CO2 35 (*) 19 - 32 (mEq/L)    Glucose, Bld 196 (*) 70 - 99 (mg/dL)    BUN 15  6 - 23 (mg/dL)    Creatinine, Ser 1.91  0.50 - 1.35 (mg/dL)    Calcium 9.5   8.4 - 10.5 (mg/dL)    GFR calc non Af Amer >90  >90 (mL/min)    GFR calc Af Amer >90  >90 (mL/min)   GLUCOSE, CAPILLARY     Status: Abnormal   Collection Time   12/21/10  7:59 AM      Component Value Range Comment  Glucose-Capillary 191 (*) 70 - 99 (mg/dL)    Comment 1 Documented in Chart      Comment 2 Notify RN     GLUCOSE, CAPILLARY     Status: Abnormal   Collection Time   12/21/10 12:06 PM      Component Value Range Comment   Glucose-Capillary 121 (*) 70 - 99 (mg/dL)    Comment 1 Documented in Chart      Comment 2 Notify RN     GLUCOSE, CAPILLARY     Status: Abnormal   Collection Time   12/21/10  5:07 PM      Component Value Range Comment   Glucose-Capillary 135 (*) 70 - 99 (mg/dL)    Comment 1 Documented in Chart      Comment 2 Notify RN     GLUCOSE, CAPILLARY     Status: Abnormal   Collection Time   12/21/10  8:18 PM      Component Value Range Comment   Glucose-Capillary 120 (*) 70 - 99 (mg/dL)   GLUCOSE, CAPILLARY     Status: Abnormal   Collection Time   12/21/10 11:50 PM      Component Value Range Comment   Glucose-Capillary 149 (*) 70 - 99 (mg/dL)   GLUCOSE, CAPILLARY     Status: Abnormal   Collection Time   12/22/10  4:23 AM      Component Value Range Comment   Glucose-Capillary 140 (*) 70 - 99 (mg/dL)   BASIC METABOLIC PANEL     Status: Abnormal   Collection Time   12/22/10  5:00 AM      Component Value Range Comment   Sodium 146 (*) 135 - 145 (mEq/L)    Potassium 2.8 (*) 3.5 - 5.1 (mEq/L)    Chloride 105  96 - 112 (mEq/L)    CO2 32  19 - 32 (mEq/L)    Glucose, Bld 107 (*) 70 - 99 (mg/dL)    BUN 17  6 - 23 (mg/dL)    Creatinine, Ser 1.61  0.50 - 1.35 (mg/dL)    Calcium 9.4  8.4 - 10.5 (mg/dL)    GFR calc non Af Amer >90  >90 (mL/min)    GFR calc Af Amer >90  >90 (mL/min)   MAGNESIUM     Status: Normal   Collection Time   12/22/10  5:00 AM      Component Value Range Comment   Magnesium 2.1  1.5 - 2.5 (mg/dL)   CBC     Status: Abnormal   Collection Time    12/22/10  5:00 AM      Component Value Range Comment   WBC 6.1  4.0 - 10.5 (K/uL)    RBC 2.96 (*) 4.22 - 5.81 (MIL/uL)    Hemoglobin 8.4 (*) 13.0 - 17.0 (g/dL)    HCT 09.6 (*) 04.5 - 52.0 (%)    MCV 89.5  78.0 - 100.0 (fL)    MCH 28.4  26.0 - 34.0 (pg)    MCHC 31.7  30.0 - 36.0 (g/dL)    RDW 40.9  81.1 - 91.4 (%)    Platelets 301  150 - 400 (K/uL)   GLUCOSE, CAPILLARY     Status: Abnormal   Collection Time   12/22/10  8:08 AM      Component Value Range Comment   Glucose-Capillary 118 (*) 70 - 99 (mg/dL)   GLUCOSE, CAPILLARY     Status: Abnormal   Collection Time   12/22/10 11:32 AM  Component Value Range Comment   Glucose-Capillary 132 (*) 70 - 99 (mg/dL)    Comment 1 Notify RN      No results found.    Post Admission Physician Evaluation: 1. Functional deficits secondary  to multiple right hemispheric infarcts due to atrial fibrillation. Patient with hemorrhagic conversion. 2. Patient admitted to receive collaborative, interdisciplinary care between the physiatrist, rehab nursing staff, and therapy team. 3. Patient's level of medical complexity and substantial therapy needs in context of that medical necessity cannot be provided at a lesser intensity of care. 4. Patient has experienced substantial functional loss from his/her baseline. Please see above functional assessment of her premorbid and current functional levels. Judging by the patient's diagnosis, physical exam, and functional history, the patient has potential for functional progress which will result in measurable gains while on inpatient rehab.  These gains will be of substantial and practical use upon discharge in facilitating mobility and self-care at the household level. 5. Physiatrist will provide 24 hour management of medical needs as well as oversight of the therapy plan/treatment and provide guidance as appropriate regarding the interaction of the two. 6. 24 hour rehab nursing will assist in the management  of  bladder management, bowel management, safety, skin/wound care, medication administration, pain management and patient education  and help integrate therapy concepts, techniques,education, etc. 7. PT will assess and treat for: balance, endurance, locomotion, psychosocial adjustment, strength and transferring. Goals are: minimal assist to moderate assist 8. OT will assess and treat for: bathing, bowel/bladder control, endurance, grooming, toileting and transferring .  Goals are: minimal assist to moderate assist and 9. SLP will assess and treat for: cognition, speech and swallowing.  Goals are: minimal assist to moderate assistance  10. Case Management and Social Worker will assess and treat for psychological issues and discharge planning. 11. Team conference will be held weekly to assess progress toward goals and to determine barriers to discharge. 12.  Patient will receive at least 3 hours of therapy per day at least 5 days per week. 13. ELOS and Prognosis: 3-4 weeks good   Medical Problem List and Plan: 1. DVT Prophylaxis/Anticoagulation: Mechanical:  Antiembolism stockings, knee (TED hose) Bilateral lower extremities.  SCDs.  No anticoagulation due to bleeding complications 2. Pain Management: Tylenol. Limit sedating medications as possible given his clinical appearance 3. Mood: Team to provide ego support. Depression screening is appropriate.  Patient Active Hospital Problem List: 4.  Ischemic stroke with recurrent bleed:  No antiplatelet or anticoagulation.  5. Atrial fibrillation:  Monitor with BID checks.  Heart rate better controlled on current regimen of coreg     and cardizem  6. HTN:  Monitor with check q shift.  Blood pressures still labile.  7. DM type 2 :  CBG checks AC and HS.  Continue lantus insulin with novolog SSI for elevated BS.      Anticipate that BS will run high as long as tube feeds on board.  8. ABLA:  Add iron supplement.  Will check anemia panel.  9.   Hypokalemia continue:  Has been 3.0 but with drop to 2.8 today. ?etiology ?dilutional. Need to increase supplement.  10.  VDRF:  Downsized to #6 trach today. Monitor tolerance and for evidence of recurrent bleeding.   Remains NPO and to start bolus TF.     11. FEN: Potassium supplementation for hypokalemia.  Add probiotic to improve stool consistency.    12/22/2010, 2:46 PM

## 2010-12-22 NOTE — Discharge Summary (Signed)
Physician Discharge Summary  Patient ID: [name] MRN: [id number] DOB/AGE: 02/01/1955 55 y.o.  Admit date: 11/19/2010 Discharge date: 12/22/2010  Admission Diagnoses: Acute stroke  Discharge Diagnoses:  Principal Problem:  *Stroke Active Problems:  ICH (intracerebral hemorrhage)  Respiratory failure  CAD (coronary artery disease)  Physical deconditioning  Diabetes mellitus  A-fib  Gout  Hyperlipemia  Hypernatremia  Procedures: Janina Mayo 11/16 (rosen), downsized to #6 on 12/10 Left PICC 11/17>>> PEG 11/26 (brodie)  Brief history [name] is a 55 y.o. male former smoker admitted on 11/19/2010 with acute onset of slurred speech and Lt sided weakness. Dx Cerebellar ischemic cva then converted to bleed. Transferred to SDU 11/24 but returned to ICU 11/25 with decreased LOC which resolved with reinitiation of mechanical ventilation.    Hospital Course:   1) CVA- cerebellar ischemic infarct w. Hemorrhagic conversion: admitted on 11/19/2010 with acute onset of slurred speech and Lt sided weakness. Dx Cerebellar ischemic cva then converted to bleed. Transferred to SDU 11/24 but returned to ICU 11/25 with decreased LOC which resolved with reinitiation of mechanical ventilation. Has made slow but steady progress since her acute event. Now participating in physical therapy. Working with PT/OT and SLP services to improve level of independence.  Plan: - per stoke team, no changes  - for in-pt rehab  -not an candidate for aspirin, coumadin or LMWH at this point given tracheal bleeding.   2) Acute resp failure- Initially intubated for airway protection. S/P trach on 11/16 by ENT Pollyann Kennedy). Has made slow but persistent progress. He is now off mechanical vent 24/7. continues trach collar looks good. Down sized to #6 trach today.  Plan:  -cont pulm hygiene  -wean fio2  -continue to to work on PMV trials and eventually swallowing assessment   3) tracheal site bleeding. Now resolved after  LMWH stopped.   4) Afib- rate controlled. Asked Dr Levie Heritage the Pt's cardiologist to assist with his care. He is now rate controlled.  Plan:  -rate control only, no asa or LMWH given recent trach site bleeding   4)HTN: now in good control  Plan:  -cont current rx   5) Hypernatremia   Lab  12/22/10 0500  12/21/10 0441  12/19/10 0445   NA  146*  148*  145   plan:  -free water  -recheck NA   6) DM - controlled on tube feed well   Lab  12/22/10 0808  12/22/10 0423  12/21/10 2350  12/21/10 2018  12/21/10 1707   GLUCAP  118*  140*  149*  120*  135*   plan:  -ssi  7) anemia: did have isolated episode of trach site bleeding this has now subsided. Current anemia due to anemia of chronic disease.   Lab  12/22/10 0500  12/19/10 0445  12/17/10 0415   HGB  8.4*  8.9*  9.2*   plan:  -trend, no xfusion unless hgb <7  -no more LMWH or asa.   8) deconditioning Plan: To go to in-patient rehab for further PT/OT and reconditioning efforts.   Discharge Exam: Patient Vitals for the past 3 hrs:  BP Temp Temp src Pulse SpO2  12/22/10 1128 120/98 mmHg 98.4 F (36.9 C) Oral 86  100 %   Exam-  Gen- drowsy today. No distress.  Skin- no rash  Neck- #8 trach midline, c/d/i  Neuro-Awke, no distress. Moves all ext. Right sided weakness.  Lungs- clear. No bleeding  Heart- AFib, distant tones  Abd- obese, distened, decreased bowel sounds  PEG.   Labs at discharge Lab Results  Component Value Date   CREATININE 0.95 12/22/2010   BUN 17 12/22/2010   NA 146* 12/22/2010   K 2.8* 12/22/2010   CL 105 12/22/2010   CO2 32 12/22/2010   Lab Results  Component Value Date   WBC 6.1 12/22/2010   HGB 8.4* 12/22/2010   HCT 26.5* 12/22/2010   MCV 89.5 12/22/2010   PLT 301 12/22/2010   Lab Results  Component Value Date   ALT 24 12/02/2010   AST 22 12/02/2010   ALKPHOS 65 12/02/2010   BILITOT 0.3 12/02/2010   Lab Results  Component Value Date   INR 1.30 11/22/2010   INR 2.21* 11/20/2010    INR 1.78* 11/19/2010    Current radiology studies No results found.  Disposition:  in-patient rehab  Diet:  NPO, to get meds/nutrition via feeding tube  Activity Clear for activity as tolerated at discretion of attending MD.   Medications    . carvedilol  25 mg Per Tube BID WC  . diltiazem  90 mg Per Tube Q6H  . free water  200 mL Per Tube Q8H  . insulin aspart  0-20 Units Subcutaneous Q4H  . insulin aspart  4 Units Subcutaneous Q4H  . insulin glargine  20 Units Subcutaneous BID  . isosorbide-hydrALAZINE  2 tablet Oral TID  . pantoprazole sodium  40 mg Per Tube Q1200  . potassium chloride  20 mEq Oral Daily  . ramipril  10 mg Per Tube BID  . rosuvastatin  5 mg Oral q1800  . sodium chloride  10 mL Intracatheter Q12H   Continuous Infusions:   . sodium chloride 20 mL/hr (12/21/10 2030)  . feeding supplement (JEVITY 1.2) 1,000 mL (12/21/10 2310)   PRN Meds:.acetaminophen (TYLENOL) oral liquid 160 mg/5 mL, ALPRAZolam, bisacodyl, labetalol, metoprolol   Discharged Condition: fair  Signed: Rylynn Schoneman,PETE 12/22/2010, 1:25 PM

## 2010-12-22 NOTE — Progress Notes (Signed)
Pt admitted to 4039 from 2908. Pt arrived via bed. 28% trach collar, suction placed. Pt denied pain. Admission process began. Brother arrived during admission process to assist with answering questions.

## 2010-12-22 NOTE — Progress Notes (Signed)
Physical Therapy Treatment Patient Details Name: [name] MRN: [id number] DOB: 08-Oct-1955 Today's Date: 12/22/2010  PT Assessment/Plan  PT - Assessment/Plan Comments on Treatment Session: more fatigued today.  may have been a result of first time PMV trial during this session PT Plan: Discharge plan remains appropriate Follow Up Recommendations: Inpatient Rehab Equipment Recommended: Defer to next venue PT Goals  Acute Rehab PT Goals PT Goal: Supine/Side to Sit - Progress: Progressing toward goal PT Goal: Sit at Edge Of Bed - Progress: Progressing toward goal PT Goal: Sit to Stand - Progress: Progressing toward goal PT Goal: Stand to Sit - Progress: Progressing toward goal PT Transfer Goal: Bed to Chair/Chair to Bed - Progress: Progressing toward goal  PT Treatment Precautions/Restrictions  Precautions Precautions: Fall;Other (comment) (trach collar) Precaution Comments: Spoke with RN who notes ok for Therapy to see pt.  Only perfomed bed level activity secondary to elevated BP.   Required Braces or Orthoses: No Restrictions Weight Bearing Restrictions: No Mobility (including Balance) Bed Mobility Rolling Left Details (indicate cue type and reason):   Right Sidelying to Sit:  (pt=50%) Supine to Sit: 1: +2 Total assist;Patient percentage (comment);HOB flat;Other (comment) (pt =50%) Supine to Sit Details (indicate cue type and reason): vc for technique (to come over to the L elbow); manual A for truncal A Sitting - Scoot to Edge of Bed: 3: Mod assist Transfers Transfers: Yes Stand Pivot Transfers: 1: +2 Total assist;Patient percentage (comment);From elevated surface;Other (comment) (pt =45%) Stand Pivot Transfer Details (indicate cue type and reason): vc's for hand placement; manual A for forward w/shift and side to side w/shift  Posture/Postural Control Posture/Postural Control: Postural limitations Postural Limitations: still unable to pull back into midline for greater  than approx. 20 degree list off midline, but is holding midline and sitting in neutal pelvic tilt with min guard A.   mod to return to midline from moderate lean Balance Balance Assessed: Yes Static Sitting Balance Static Sitting - Balance Support: Right upper extremity supported;Left upper extremity supported;Feet supported Static Sitting - Level of Assistance: Other (comment) (min to min guard A) Static Sitting - Comment/# of Minutes: 15 Dynamic Sitting Balance Dynamic Sitting - Balance Support: Right upper extremity supported;Left upper extremity supported;During functional activity (maintaining upright midline sitting while raising either leg) Dynamic Sitting - Level of Assistance: 4: Min assist Dynamic Sitting Balance - Compensations: manual A for w/shift and truncal support Exercise    End of Session PT - End of Session Activity Tolerance: Patient limited by fatigue Patient left: in chair Nurse Communication: Mobility status for transfers General Behavior During Session: Flat affect Cognition: [facility] for tasks performed Cognitive Impairment: following 1-step commands consistently  Deigo Alonso, Eliseo Gum 12/22/2010, 12:57 PM  12/22/2010  [place], PT [phone] (pager)

## 2010-12-22 NOTE — Progress Notes (Signed)
Occupational Therapy Treatment Patient Details Name: [name] MRN: [id number] DOB: 1956-01-04 Today's Date: 12/22/2010  OT Assessment/Plan OT Assessment/Plan Comments on Treatment Session: Pt. completed EOB ADLs for ~ 10 mins with intermittend min assist support to regain balance due to decreased trunk control. Pt. very motivated to get up and out of the bed. Speech therapist present throughout to trial PSMV with activity,  OT Plan: Discharge plan remains appropriate OT Frequency: Min 2X/week Follow Up Recommendations: Inpatient Rehab Equipment Recommended: Defer to next venue OT Goals Acute Rehab OT Goals OT Goal Formulation: With patient Time For Goal Achievement: 2 weeks ADL Goals Pt Will Perform Grooming: Supported;with cueing (comment type and amount);Sitting, chair;with set-up ADL Goal: Grooming - Progress: Met Pt Will Perform Upper Body Bathing: with min assist;Sitting, edge of bed ADL Goal: Upper Body Bathing - Progress: Not addressed Pt Will Perform Upper Body Dressing: with min assist;Sitting, bed ADL Goal: Upper Body Dressing - Progress: Progressing toward goals Pt Will Transfer to Toilet: with 2+ total assist;Stand pivot transfer;3-in-1 ADL Goal: Toilet Transfer - Progress: Progressing toward goals Additional ADL Goal #1: Pt. will roll right and left with total assist +2 pt=30% to increase participation with ADLs. Additional ADL Goal #2: Pt. will sit EOB ~54mins with mod assist trunk support in preparation for ADLs  OT Treatment Precautions/Restrictions  Precautions Precautions: Fall;Other (comment) (trach collar) Restrictions Weight Bearing Restrictions: No   ADL ADL Grooming: Performed;Wash/dry face;Moderate assistance Grooming Details (indicate cue type and reason): Pt. provided with support under rt. elbow and hand over hand facilitation to maintain grasp of wash cloth (due to pt. with ataxia) Where Assessed - Grooming: Unsupported;Sitting, bed (close  supervision) Upper Body Dressing: Performed;Maximal assistance Upper Body Dressing Details (indicate cue type and reason): With donning gown and mod verbal cues for inititation and completiong of task Where Assessed - Upper Body Dressing: Unsupported;Sitting, bed (close supervision) ADL Comments: Pt. completed EOB dynamic balance activities to facilitate increased trunk control and bilateral UE reaching to increase functional use by controlling movements due to Rt. UE ataxia>lt. UE Mobility  Bed Mobility Bed Mobility: Yes Rolling Left: 3: Mod assist Rolling Left Details (indicate cue type and reason):   Right Sidelying to Sit:  (pt=50%) Supine to Sit: 1: +2 Total assist;Patient percentage (comment);HOB flat;Other (comment) (pt =50%) Supine to Sit Details (indicate cue type and reason): vc for technique (to come over to the L elbow); manual A for truncal A Sitting - Scoot to Edge of Bed: 3: Mod assist Transfers Transfers: Yes Sit to Stand: 1: +2 Total assist;Patient percentage (comment);From bed;With upper extremity assist (pt=45%) Sit to Stand Details (indicate cue type and reason): Max verbal cues for hand placement and technique     End of Session OT - End of Session Equipment Utilized During Treatment: Gait belt Activity Tolerance: Patient tolerated treatment well Patient left: with call bell in reach;in chair Nurse Communication: Mobility status for transfers;Need for lift equipment General Behavior During Session: Flat affect Cognition: WFL for tasks performed Cognitive Impairment: following 1-step commands consistently   Co-treat with Flora Lipps, OTR/L Pager [phone]  12/22/2010, 1:09 PM

## 2010-12-22 NOTE — Progress Notes (Addendum)
CSW provided letter to pt niece for utility company re pt disposition. CSW provided referrals to [facility] and [employer], as pt niece requesting information of durable POA and disability application for pt.   CSW advised by NCM pt to d/c to CIR today. CSW signing off.  Baxter Flattery, MSW [phone]

## 2010-12-22 NOTE — Progress Notes (Signed)
Stroke Team Progress Note  SUBJECTIVE [name] whose symptoms are gradually improving. his brother is at the bedside. He is currently working with therapy and sitting upright independently at the bedside. He is a good rehabilitation candidate per therapists.  OBJECTIVE Most recent Vital Signs: Temp: 97.9 F (36.6 C) (12/10 0807) Temp src: Oral (12/10 0807) BP: 161/104 mmHg (12/10 0807) Pulse Rate: 87  (12/10 0807) Respiratory Rate: 22 O2 Saturdation: 100%  CBG (last 3)   Basename 12/22/10 0808 12/22/10 0423 12/21/10 2350  GLUCAP 118* 140* 149*   Intake/Output from previous day: 12/09 0701 - 12/10 0700 In: 2840 [I.V.:440; NG/GT:1020] Out: 1861 [Urine:1860; Stool:1]  IV Fluid Intake     . sodium chloride 20 mL/hr (12/21/10 2030)  . feeding supplement (JEVITY 1.2) 1,000 mL (12/21/10 2310)  . DISCONTD: sodium chloride 20 mL/hr (12/20/10 1727)   Diet    tube feedings liquids  Activity  Up with assistance  DVT Prophylaxis  SCDs  Studies BASIC METABOLIC PANEL     Status: Abnormal   Collection Time   12/22/10  5:00 AM      Component Value Range   Sodium 146 (*) 135 - 145 (mEq/L)   Potassium 2.8 (*) 3.5 - 5.1 (mEq/L)   Chloride 105  96 - 112 (mEq/L)   CO2 32  19 - 32 (mEq/L)   Glucose, Bld 107 (*) 70 - 99 (mg/dL)   BUN 17  6 - 23 (mg/dL)   Creatinine, Ser 1.61  0.50 - 1.35 (mg/dL)   Calcium 9.4  8.4 - 09.6 (mg/dL)   GFR calc non Af Amer >90  >90 (mL/min)   GFR calc Af Amer >90  >90 (mL/min)  MAGNESIUM     Status: Normal   Collection Time   12/22/10  5:00 AM      Component Value Range   Magnesium 2.1  1.5 - 2.5 (mg/dL)  CBC     Status: Abnormal   Collection Time   12/22/10  5:00 AM      Component Value Range   WBC 6.1  4.0 - 10.5 (K/uL)   RBC 2.96 (*) 4.22 - 5.81 (MIL/uL)   Hemoglobin 8.4 (*) 13.0 - 17.0 (g/dL)   HCT 04.5 (*) 40.9 - 52.0 (%)   MCV 89.5  78.0 - 100.0 (fL)   MCH 28.4  26.0 - 34.0 (pg)   MCHC 31.7  30.0 - 36.0 (g/dL)   RDW 81.1  91.4 - 78.2 (%)   Platelets 301  150 - 400 (K/uL)   Physical Exam  Awake sitting up on the side of the bed with some help from physical therapist. Head is nontraumatic neck is supple without bruit.cardiac exam is regular heart sounds atrial fibrillation. Lungs good auscultation. Neurological exam : awake alert oriented x3. Follows commands well. Unable to talk to tracheostomy. Eye movements are full range with sec headache dysmetria to the right. Face is symmetric. Tongue is midline. Motor system exam moves all 4 extremities well against gravity but has significant hemiataxia on the right hand to a lesser degree on the left. Truncal ataxia present. Gait was not tested.   ASSESSMENT [name] with right hemispheric brain infarcts, secondary to atrial fibrillation, off antiplatelets/anticoagulants secondary to post ischemic stroke cerebral hemorrhage, GI Bleed and trach hemorrhage. Will leave off ASA and lovenox. Overall much improved from last week. He'll he would be a good inpatient rehabilitation candidate.  Afib,  BP - Dr [name] saw this weekend and again adjusted medications  Stroke risk factors:atrial fibrillation, hypertension, diabetes mellitus, hyperlipidemia and CAD   Hospital day # 33  TREATMENT/PLAN Re-assessment by inpatient rehabilitation. Medically stable for transfer there when bed available.Discussed with patient's brother at the bedside.  Joaquin Music, ANP-BC, GNP-BC [facility] Pager: [phone] 12/22/2010 8:39 AM  Dr. Delia Heady, Stroke Center Medical Director, has personally reviewed chart, pertinent data, examined the patient and developed the plan of care.

## 2010-12-22 NOTE — PMR Pre-admission (Signed)
PMR Admission Coordinator Pre-Admission Assessment  Patient:  [name] is an 55 y.o., male MRN:  [id number] DOB:  May 26, 1955 Height:  Height: 6' (182.9 cm) Weight:  Weight: 90 kg (198 lb 6.6 oz)  Insurance Information: HMO     PPO:      PCP:      IPA:      80/20:      OTHER: Choice Plus plan PRIMARY:[employer]      Policy#:[phone]      Subscriber:Amel Wilmot CM Name:Zelda Fleming      Phone#:[phone]     Fax#:  Pre-Cert#:[id number]      Employer:  Benefits:  Phone #:[phone]     Name:Mat Eff. Date:08/13/07     Deduct:$250    Out of Pocket Max:$2750 + Ded.    Life MVH:QIONGEXBM CIR:w/precert 80%      SNF:w/precert 80% Outpatient:  w/medical necessity   Co-Pay:$20/visit Home Health:w/precert 80%  60 visits/year      Co-Pay:20% DME:80%  Precert>$5000 or rentals     Co-Pay:20% Providers:in network  Current Medical History:   Patient Admitting Diagnosis: R cerebellar CVA with hemorrhagic conversion   History of Present Illness: See rehab consult.  L cerebellar CVA with hemorrhage.  Has a trach being downsized today, Peg for tube feedings.  Currently with condom catheter.  Alert and sitting up in chair today.    Patients Past Medical History:   Past Medical History  Diagnosis Date  . Diabetes mellitus   . Coronary artery disease   . Hypertension   . Gout   . CHF (congestive heart failure)   . Afib   . Arthritis   . Hypertensive emergency 11/19/2010  . Pulmonary edema 11/19/2010  . Acute exacerbation of congestive heart failure 11/19/2010  . Thyroiditis 11/20/2010  . ICH (intracerebral hemorrhage) 12/22/2010  . Stroke 11/19/2010  . Respiratory failure 11/19/2010  . CAD (coronary artery disease) 11/20/2010  . Physical deconditioning 12/22/2010  . Diabetes mellitus 11/19/2010  . A-fib 11/19/2010  . Hyperlipemia 11/21/2010   Family Medical History:  family history is not on file. NIH Stroke scale:  Height and Weight Height: 6' (182.9 cm) Weight: 90 kg (198 lb 6.6 oz) Type of  Weight: Actual BSA (Calculated - sq m): 2.23 sq meters BMI (Calculated): 33.2  Weight in (lb) to have BMI = 25: 183.9  Glascow Coma Scale:     Prior Rehab/Hospitalizations: Hospitalized for CHF 10 months ago  Medications Current Medications: Current facility-administered medications:0.45 % sodium chloride infusion, , Intravenous, Continuous, Barbaraann Share, MD, Last Rate: 20 mL/hr at 12/21/10 2030, 20 mL/hr at 12/21/10 2030;  acetaminophen (TYLENOL) solution 650 mg, 650 mg, Per Tube, Q4H PRN, Cidney Hulett, 650 mg at 12/12/10 2259;  ALPRAZolam Prudy Feeler) tablet 0.25 mg, 0.25 mg, Oral, BID PRN, Annie Main, NP, 0.25 mg at 12/21/10 2028 antiseptic oral rinse (BIOTENE) solution 15 mL, 15 mL, Mouth Rinse, QID, Kalman Shan, MD, 15 mL at 12/22/10 1259;  bisacodyl (DULCOLAX) suppository 10 mg, 10 mg, Rectal, Daily PRN, Barbaraann Share, MD;  carvedilol (COREG) tablet 25 mg, 25 mg, Per Tube, BID WC, Robynn Pane, MD, 25 mg at 12/22/10 8413;  chlorhexidine (PERIDEX) 0.12 % solution 15 mL, 15 mL, Mouth Rinse, BID, Kalman Shan, MD, 15 mL at 12/22/10 0833 diltiazem (CARDIZEM) 10 mg/ml oral suspension 90 mg, 90 mg, Per Tube, Q6H, Annie Main, NP, 90 mg at 12/22/10 1252;  feeding supplement (JEVITY 1.2) liquid 1,000 mL, 1,000 mL, Per Tube, Continuous, Drusilla Kanner,  PHARMD, Last Rate: 60 mL/hr at 12/21/10 2310, 1,000 mL at 12/21/10 2310;  free water 200 mL, 200 mL, Per Tube, Q8H, Anders Simmonds, NP insulin aspart (novoLOG) injection 0-20 Units, 0-20 Units, Subcutaneous, Q4H, Annie Main, NP, 3 Units at 12/22/10 1253;  insulin aspart (novoLOG) injection 4 Units, 4 Units, Subcutaneous, Q4H, Annie Main, NP, 4 Units at 12/22/10 1253;  insulin glargine (LANTUS) injection 20 Units, 20 Units, Subcutaneous, BID, Billy Fischer, MD, 20 Units at 12/22/10 0931 isosorbide-hydrALAZINE (BIDIL) 20-37.5 MG per tablet 2 tablet, 2 tablet, Oral, TID, Robynn Pane, MD, 2 tablet at 12/22/10 0931;  labetalol  (NORMODYNE,TRANDATE) injection 10 mg, 10 mg, Intravenous, Q2H PRN, Elizabeth Deterding, 10 mg at 12/18/10 2300;  metoprolol (LOPRESSOR) injection 2.5-5 mg, 2.5-5 mg, Intravenous, Q3H PRN, Canary Brim, NP, 5 mg at 12/16/10 0338 pantoprazole sodium (PROTONIX) 40 mg/20 mL oral suspension 40 mg, 40 mg, Per Tube, Q1200, Drake Leach Rumbarger, PHARMD, 40 mg at 12/22/10 1252;  potassium chloride SA (K-DUR,KLOR-CON) CR tablet 20 mEq, 20 mEq, Oral, Daily, Robynn Pane, MD, 20 mEq at 12/22/10 0931;  ramipril (ALTACE) capsule 10 mg, 10 mg, Per Tube, BID, Robynn Pane, MD, 10 mg at 12/22/10 0932 rosuvastatin (CRESTOR) tablet 5 mg, 5 mg, Oral, q1800, Dennie Fetters, RPH, 5 mg at 12/20/10 1728;  sodium chloride 0.9 % injection 10 mL, 10 mL, Intracatheter, Q12H, Mcarthur Rossetti. Feinstein, 10 mL at 12/22/10 0932;  DISCONTD: free water 200 mL, 200 mL, Per Tube, Q8H, Barbaraann Share, MD, 200 mL at 12/22/10 0608  Additional Precautions/Restrictions: Precautions Precautions: Fall;Other (comment) (trach collar) Precaution Comments: Spoke with RN who notes ok for Therapy to see pt.  Only perfomed bed level activity secondary to elevated BP.   Required Braces or Orthoses: No Restrictions Weight Bearing Restrictions: No  Therapy Assessments Cognition Arousal/Alertness: Lethargic Overall Cognitive Status: Difficult to assess (Due to pt. with decreased verbalization and arousal) Difficult to assess due to: impaired communication;tracheostomy Orientation Level: Oriented to person;Oriented to place Home Living Lives With: Alone Type of Home: House Home Layout: One level Home Access: Stairs to enter Entrance Stairs-Rails: Lawyer of Steps: 3-4 Bathroom Toilet: Standard Home Adaptive Equipment: Crutches Sensation Light Touch: Impaired by gross assessment Stereognosis: Impaired by gross assessment Hot/Cold: Impaired by gross assessment Proprioception: Impaired by gross  assessment Additional Comments: Difficult to formally assess due to pt. not following commands consistently Cognition Arousal/Alertness: Lethargic Overall Cognitive Status: Difficult to assess (Due to pt. with decreased verbalization and arousal) Difficult to assess due to: impaired communication;tracheostomy Orientation Level: Oriented to person;Oriented to place Coordination Gross Motor Movements are Fluid and Coordinated: No Fine Motor Movements are Fluid and Coordinated: No Finger Nose Finger Test: Pt. unable to complete Heel Shin Test: Pt. unable  Prior Function: Prior Function Level of Independence: Independent with basic ADLs;Independent with homemaking with ambulation;Independent with gait Driving: Yes  ADLs/Mobility: ADL Eating/Feeding: NPO Grooming: Performed;Wash/dry face;Moderate assistance Grooming Details (indicate cue type and reason): Pt. provided with support under rt. elbow and hand over hand facilitation to maintain grasp of wash cloth (due to pt. with ataxia) Where Assessed - Grooming: Unsupported;Sitting, bed (close supervision) Upper Body Bathing: Simulated;Chest;Right arm;Left arm;Abdomen;+1 Total assistance Where Assessed - Upper Body Bathing: Supine, head of bed up Lower Body Bathing: Simulated;+1 Total assistance Where Assessed - Lower Body Bathing: Supine, head of bed up Upper Body Dressing: Performed;Maximal assistance Upper Body Dressing Details (indicate cue type and reason): With donning gown and mod verbal cues for inititation and  completiong of task Where Assessed - Upper Body Dressing: Unsupported;Sitting, bed (close supervision) Lower Body Dressing: +1 Total assistance;Performed Lower Body Dressing Details (indicate cue type and reason): Pt. unable to don/doff socks due to decreased functional use of bilateral UE and trunk weakness Where Assessed - Lower Body Dressing: Sit to stand from bed Toilet Transfer: Simulated;+2 Total assistance;Comment for  patient % (pt=40%) Toilet Transfer Details (indicate cue type and reason): Max verbal and hand over hand cues for hand placement and transfer technique Toilet Transfer Method: Stand pivot Toilet Transfer Equipment: Other (comment) Nurse, children's) Toileting - Clothing Manipulation: Not assessed Toileting - Hygiene: Performed;+1 Total assistance Toileting - Hygiene Details (indicate cue type and reason): Pt. incontinent of bowels upon arrival and with rolling Where Assessed - Toileting Hygiene: Rolling right and/or left Tub/Shower Transfer: Not assessed ADL Comments: Pt. completed EOB dynamic balance activities to facilitate increased trunk control and bilateral UE reaching to increase functional use by controlling movements due to Rt. UE ataxia>lt. UE  Bed Mobility Bed Mobility: Yes Rolling Right: 1: +2 Total assist;Patient percentage (comment);Other (comment);With rail (pt=40%) Rolling Right Details (indicate cue type and reason): Hand over hand to facilitation to complete roll with upper extremity reach Rolling Left: 3: Mod assist Rolling Left Details (indicate cue type and reason):   Right Sidelying to Sit:  (pt=50%) Right Sidelying to Sit Details (indicate cue type and reason): vc's for hand placement; manual A for truncal control Supine to Sit: 1: +2 Total assist;Patient percentage (comment);HOB flat;Other (comment) (pt =50%) Supine to Sit Details (indicate cue type and reason): vc for technique (to come over to the L elbow); manual A for truncal A Sitting - Scoot to Edge of Bed: 3: Mod assist Transfers Transfers: Yes Sit to Stand: 1: +2 Total assist;Patient percentage (comment);From bed;With upper extremity assist (pt=45%) Sit to Stand Details (indicate cue type and reason): Max verbal cues for hand placement and technique Stand to Sit: 1: +2 Total assist;Patient percentage (comment);To bed (pt > 50%) Stand Pivot Transfers: 1: +2 Total assist;Patient percentage (comment);From elevated  surface;Other (comment) (pt =45%) Stand Pivot Transfer Details (indicate cue type and reason): vc's for hand placement; manual A for forward w/shift and side to side w/shift Squat Pivot Transfers: 1: +2 Total assist;Patient percentage (comment);Other (comment) (pt =>40%) Squat Pivot Transfer Details (indicate cue type and reason): vc's for hand placement; manual A for support and w/shift for pivotal steps Ambulation/Gait Ambulation/Gait: No Posture/Postural Control Posture/Postural Control: Postural limitations Postural Limitations: still unable to pull back into midline for greater than approx. 20 degree list off midline, but is holding midline and sitting in neutal pelvic tilt with min guard A.   mod to return to midline from moderate lean Balance Balance Assessed: Yes Static Sitting Balance Static Sitting - Balance Support: Right upper extremity supported;Left upper extremity supported;Feet supported Static Sitting - Level of Assistance: Other (comment) (min to min guard A) Static Sitting - Comment/# of Minutes: 15 Dynamic Sitting Balance Dynamic Sitting - Balance Support: Right upper extremity supported;Left upper extremity supported;During functional activity (maintaining upright midline sitting while raising either leg) Dynamic Sitting - Level of Assistance: 4: Min assist Dynamic Sitting Balance - Compensations: manual A for w/shift and truncal support Static Standing Balance Static Standing - Balance Support: Right upper extremity supported;Left upper extremity supported Static Standing - Level of Assistance: 1: +2 Total assist;Patient percentage (comment) (pt>50%) Static Standing - Comment/# of Minutes: 3-4 (times 3)  Home Assistive Devices/Equipment:  Home Assistive Devices/Equipment Home Assistive Devices/Equipment: None  Discharge Planning:  Discharge Planning Living Arrangements: Spouse/significant other Support Systems: Spouse/significant other Do you have any problems  obtaining your medications?: No (reported earlier) Type of Residence: Private residence Home Care Services: No  Prior Functional Levels:  Prior Functional Level Bed Mobility: I Transfers: I Mobility - Walk/Wheelchair: I Upper Body Dressing: I Lower Body Dressing: i Grooming: i Eating/Drinking: i Statistician: i Bladder Continence: WNL Bowel Management: WNL Stair Climbing: I Communication: WNL Memory: WNl Cooking/Meal Prep: I Housework: I Money Management: I Driving: yes  Current Functional Levels:  Current Functional Level Bladder Continence: Condom catheter Bowel Management: Last BM 12/21/10  Previous Home Environment:  Previous Home Environment Living Arrangements: Spouse/significant other Support Systems: Spouse/significant other Do you have any problems obtaining your medications?: No (reported earlier) Type of Residence: Private residence Home Care Services: No Home Environment Number of Levels: 1 Previous Home Environment Number of Steps: 2 Previous Home Environment Is Bedroom on Main Floor?: Yes Previous Home Environment Is Bathroom on Main Floor?: Yes  Discharge Living Setting:  Discharge Living Setting Plans for Discharge Living Setting: Lives with (comment);Other (Comment) (Home with brother) Discharge Living Setting Number of Levels: 1 Discharge Living Setting Number of Steps: 3 Discharge Living Setting is Bedroom on Main Floor?: Yes Discharge Living Setting is Bathroom on Main Floor?: Yes  Social/Family/Support Systems:  Social/Family/Support Systems Contact Information: Maninder Deboer Anticipated Caregiver: brothers and sister Anticipated Caregiver's Contact Information: Molly Maduro (h) [phone] (c) [phone] Ability/Limitations of Caregiver: Two brothers and sister can assist, not working Caregiver Availability: 24/7 Discharge Plan Discussed with Primary Caregiver: Yes Is Caregiver In Agreement with Plan?: Yes Does Caregiver/Family have Issues with  Lodging/Transportation while Pt is in Rehab?: No (Brother currently staying at patient's house)  Goals/Additional Needs:  Goals/Additional Needs Patient/Family Goal for Rehab: PT/OT mod A goals (ELOS 4 weeks) Cultural Considerations: None Dietary Needs: NPO with tube feedings through PEG Equipment Needs: TBD Pt/Family Agrees to Admission and willing to participate: Yes Program Orientation Provided & Reviewed with Pt/Caregiver Including Roles  & Responsibilities: Yes  Preadmission Screen Completed By:  Trish Mage, 12/22/2010 2:26 PM  Patient's condition:  Please see physician update to information in consult dated 12/18/10 .  Preadmission Screen Competed XB:JYNWG Koleen Distance, RN, Time/Date,1437/12/21/12.  Discussed status with Dr. Riley Kill on12/10/12 at 1439 (time/date) and received telephone approval for admission today.  Admission Coordinator:  Trish Mage, time1439/Date12/10/12  .

## 2010-12-23 ENCOUNTER — Inpatient Hospital Stay (HOSPITAL_COMMUNITY): Payer: 59

## 2010-12-23 DIAGNOSIS — Z5189 Encounter for other specified aftercare: Secondary | ICD-10-CM

## 2010-12-23 DIAGNOSIS — I6340 Cerebral infarction due to embolism of unspecified cerebral artery: Secondary | ICD-10-CM

## 2010-12-23 DIAGNOSIS — J9600 Acute respiratory failure, unspecified whether with hypoxia or hypercapnia: Secondary | ICD-10-CM

## 2010-12-23 LAB — CBC
HCT: 26.4 % — ABNORMAL LOW (ref 39.0–52.0)
Hemoglobin: 8.2 g/dL — ABNORMAL LOW (ref 13.0–17.0)
MCHC: 31.1 g/dL (ref 30.0–36.0)

## 2010-12-23 LAB — GLUCOSE, CAPILLARY
Glucose-Capillary: 130 mg/dL — ABNORMAL HIGH (ref 70–99)
Glucose-Capillary: 131 mg/dL — ABNORMAL HIGH (ref 70–99)
Glucose-Capillary: 156 mg/dL — ABNORMAL HIGH (ref 70–99)
Glucose-Capillary: 62 mg/dL — ABNORMAL LOW (ref 70–99)
Glucose-Capillary: 98 mg/dL (ref 70–99)

## 2010-12-23 LAB — COMPREHENSIVE METABOLIC PANEL
Alkaline Phosphatase: 92 U/L (ref 39–117)
BUN: 16 mg/dL (ref 6–23)
CO2: 33 mEq/L — ABNORMAL HIGH (ref 19–32)
GFR calc Af Amer: >90 mL/min (ref >90)
GFR calc non Af Amer: 90 mL/min — ABNORMAL LOW (ref >90)
Glucose, Bld: 119 mg/dL — ABNORMAL HIGH (ref 70–99)
Potassium: 2.9 mEq/L — ABNORMAL LOW (ref 3.5–5.1)
Total Bilirubin: 0.2 mg/dL — ABNORMAL LOW (ref 0.3–1.2)
Total Protein: 7.1 g/dL (ref 6.0–8.3)

## 2010-12-23 LAB — DIFFERENTIAL
Basophils Absolute: 0 10*3/uL (ref 0.0–0.1)
Basophils Relative: 0 % (ref 0–1)
Eosinophils Absolute: 0.2 10*3/uL (ref 0.0–0.7)
Lymphs Abs: 2.7 10*3/uL (ref 0.7–4.0)
Neutrophils Relative %: 56 % (ref 43–77)

## 2010-12-23 LAB — BASIC METABOLIC PANEL
CO2: 30 mEq/L (ref 19–32)
Calcium: 9.4 mg/dL (ref 8.4–10.5)
GFR calc Af Amer: >90 mL/min (ref >90)
GFR calc non Af Amer: 81 mL/min — ABNORMAL LOW (ref >90)
Sodium: 145 mEq/L (ref 135–145)

## 2010-12-23 LAB — CLOSTRIDIUM DIFFICILE BY PCR: Toxigenic C. Difficile by PCR: NEGATIVE

## 2010-12-23 MED ORDER — JEVITY 1.2 CAL PO LIQD
1000.0000 mL | Freq: Every day | ORAL | Status: DC
  Administered 2010-12-23 (×2): 1000 mL
  Administered 2010-12-23: 260 mL
  Administered 2010-12-24 (×3): 1000 mL
  Filled 2010-12-23 (×11): qty 1000

## 2010-12-23 MED ORDER — INSULIN GLARGINE 100 UNIT/ML ~~LOC~~ SOLN: 20.0000 [IU] | Freq: Two times a day (BID) | SUBCUTANEOUS | Status: DC

## 2010-12-23 MED ORDER — SODIUM CHLORIDE 0.9 % IJ SOLN
10.0000 mL | INTRAMUSCULAR | Status: DC | PRN
  Administered 2010-12-23 – 2010-12-29 (×20): 10 mL

## 2010-12-23 MED ORDER — POTASSIUM CHLORIDE 20 MEQ/15ML (10%) PO LIQD
40.0000 meq | Freq: Three times a day (TID) | ORAL | Status: DC
  Filled 2010-12-23 (×3): qty 30

## 2010-12-23 MED ORDER — FREE WATER
140.0000 mL | Freq: Every day | Status: DC
  Administered 2010-12-23 – 2010-12-29 (×29): 140 mL
  Filled 2010-12-23 (×35): qty 140

## 2010-12-23 MED ORDER — POTASSIUM CHLORIDE 20 MEQ/15ML (10%) PO LIQD
40.0000 meq | Freq: Three times a day (TID) | ORAL | Status: DC
  Administered 2010-12-23 – 2010-12-24 (×5): 40 meq via ORAL
  Filled 2010-12-23 (×6): qty 30

## 2010-12-23 MED ORDER — INSULIN GLARGINE 100 UNIT/ML ~~LOC~~ SOLN
10.0000 [IU] | Freq: Two times a day (BID) | SUBCUTANEOUS | Status: AC
  Administered 2010-12-23 (×2): 10 [IU] via SUBCUTANEOUS
  Filled 2010-12-23: qty 3

## 2010-12-23 MED ORDER — JEVITY 1.2 CAL PO LIQD
1000.0000 mL | Freq: Four times a day (QID) | ORAL | Status: DC
  Administered 2010-12-23: 1000 mL
  Filled 2010-12-23 (×6): qty 1000

## 2010-12-23 MED ORDER — FLUCONAZOLE 40 MG/ML PO SUSR
100.0000 mg | Freq: Every day | ORAL | Status: AC
  Administered 2010-12-23 – 2010-12-25 (×3): 100 mg via ORAL
  Administered 2010-12-26 – 2010-12-27 (×2): 40 mg via ORAL
  Filled 2010-12-23 (×6): qty 2.5

## 2010-12-23 NOTE — Progress Notes (Signed)
Pt pulse rate increased to over 250 BPM per pulse oximetry monitor. Lasted less than 45 secs.  Pt asymptomatic. Pt and brother at bedside with disabilities personal filling out paperwork. Pam Love at bedside assessing patient. Pt pulse rate settled out to 80-90s. Pt denied feeling differently.

## 2010-12-23 NOTE — Progress Notes (Signed)
Gastric residual <16mL.  Tube feeding restarted at a rate of 60mL per hour as was previously running.  Will continue to monitor.

## 2010-12-23 NOTE — Progress Notes (Addendum)
INITIAL ADULT NUTRITION ASSESSMENT Date: 12/23/2010   Time: 9:34 AM  Reason for Assessment: MD consult for bolus TF recommendations  ASSESSMENT: Male 55 y.o.  Dx: s/p CVA  Hx:  Past Medical History  Diagnosis Date  . Diabetes mellitus   . Coronary artery disease   . Hypertension   . Gout   . CHF (congestive heart failure)   . Afib   . Arthritis   . Hypertensive emergency 11/19/2010  . Pulmonary edema 11/19/2010  . Acute exacerbation of congestive heart failure 11/19/2010  . Thyroiditis 11/20/2010  . ICH (intracerebral hemorrhage) 12/22/2010  . Stroke 11/19/2010  . Respiratory failure 11/19/2010  . CAD (coronary artery disease) 11/20/2010  . Physical deconditioning 12/22/2010  . Diabetes mellitus 11/19/2010  . A-fib 11/19/2010  . Hyperlipemia 11/21/2010   Related Meds:     . carvedilol  25 mg Per Tube BID WC  . dextrose      . diltiazem  90 mg Per Tube Q6H  . feeding supplement (JEVITY 1.2)  1,000 mL Per Tube Q6H  . free water  200 mL Per Tube Q8H  . insulin aspart  0-20 Units Subcutaneous Q4H  . insulin glargine  10 Units Subcutaneous BID  . insulin glargine  20 Units Subcutaneous BID  . isosorbide-hydrALAZINE  2 tablet Oral TID  . pantoprazole sodium  40 mg Per Tube Q1200  . potassium chloride  40 mEq Oral TID  . ramipril  10 mg Per Tube BID  . rosuvastatin  5 mg Oral q1800  . DISCONTD: insulin aspart  4 Units Subcutaneous Q4H  . DISCONTD: insulin glargine  20 Units Subcutaneous BID  . DISCONTD: potassium chloride  40 mEq Oral BID  . DISCONTD: potassium chloride  20 mEq Oral Daily  . DISCONTD: potassium chloride  40 mEq Oral BID   Ht: 6' (182.9 cm)  Wt: 204 lb 9.4 oz (92.8 kg)  Ideal Wt: 80.9 kg % Ideal Wt: 115%  Usual Wt: n/a % Usual Wt:  n/a  Body mass index is 27.75 kg/(m^2). Pt is overweight.  Food/Nutrition Related Hx:   Labs:  CMP     Component Value Date/Time   NA 146* 12/23/2010 0420   K 2.9* 12/23/2010 0420   CL 105 12/23/2010 0420   CO2  33* 12/23/2010 0420   GLUCOSE 119* 12/23/2010 0420   BUN 16 12/23/2010 0420   CREATININE 0.99 12/23/2010 0420   CALCIUM 9.3 12/23/2010 0420   PROT 7.1 12/23/2010 0420   ALBUMIN 2.4* 12/23/2010 0420   AST 20 12/23/2010 0420   ALT 23 12/23/2010 0420   ALKPHOS 92 12/23/2010 0420   BILITOT 0.2* 12/23/2010 0420   GFRNONAA 90* 12/23/2010 0420   GFRAA >90 12/23/2010 0420   Intake/Output: I/O last 3 completed shifts: In: 5 [Other:660; NG/GT:400] Out: 988 [Urine:988]    Diet Order: NPO  Supplements/Tube Feeding: Jevity 1.2, 300 ml QID. This provides 1440 kcal, 67 g protein, 968 ml free water.  IVF: none  Estimated Nutritional Needs:   Kcal:  1800 - 2000 kcal Protein:  80 - 95 g protein Fluid:  1.8 - 2.0 L/d  Pt followed by RD during acute hospitalization. PEG placed 12/03/10 during acute hospitalization. Note pt had fluctuating weights (range 198 - 220 lb x 1 month). Current TF regimen providing 1440 kcal, 67 g protein, 968 ml free water. This is providing 80% of kcal needs, 84% protein needs.  NUTRITION DIAGNOSIS: -Inadequate oral intake (NI-2.1).  Status: Ongoing  RELATED TO: inability to  eat  AS EVIDENCE BY: NPO status  MONITORING/EVALUATION(Goals): Goal: TF to meet >90% of estimated needs Monitor: TF tolerance and adequacy, weights, labs  EDUCATION NEEDS: -Education not appropriate at this time  INTERVENTION: 1. To better meet kcal and protein needs, recommend Jevity 1.2 bolus of 320 ml five times daily. This will provide: 1920 kcal (100% kcal needs), 89 g protein (100% protein needs), 1290 ml free water. Recommend water flushes of 140 ml free water five times daily, to provide 1990 ml total water. 2. RD to follow nutrition care plan.  Dietitian #: [phone]  DOCUMENTATION CODES Per approved criteria  -Not Applicable    Adair Laundry 12/23/2010, 9:34 AM

## 2010-12-23 NOTE — Progress Notes (Signed)
Occupational Therapy Assessment and Plan And Treatment Note  Patient Details  Name: [name] MRN: [id number] Date of Birth: 11/02/1955  OT Diagnosis: ataxia, cognitive deficits, disturbance of vision and muscle weakness (generalized) Rehab Potential: Rehab Potential: Good ELOS: 4 weeks   Today's Date: 12/23/2010 Time: 4098-1191 Time Calculation (min): 58 min  Assessment & Plan Clinical Impression:   Patient is an 55 y.o. male with H/O COPD, DM, A fib, admitted 11/19/2010 with acute onset of slurred speech and left sided weakness. CT head without acute abnormality. Patient became diaphoretic with increased WOB with inability to speak, LUE flaccidity while in Xray. He was intubated for airway support. CT angio chest without PE and evidence of CHF. CTA head and neck without thrombus, distal left vertebral artery stenosis and basilar artery stenosis. MRI brain with acute subacute infarct right SCA territory including mid brain and upper pons. 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 Dr Phoebe Perch consulted and recommended reversing coumadin and heparin.  On 11/10 patient, had ventriculostomy placed with good drainage of CSF. Patient comatose and unresponsive. Patient trached by Dr Pollyann Kennedy on 11/16 and PEG placed 11/21 by Dr. Juanda Chance. Developed fevers and diarrhea, started on flagyl for ? C diff. Ventilator wean initiated On, 11/24 with worsening of MS and repeat CT head with slight increase in hemorrhage and ?new right parietal infarct and patient reintubated. Dr Phoebe Perch consulted and no surgical intervention recommended. Vent wean initiated 11/28 and patient tolerated extubation. Noted to have blood in stools with ?lower GIB. GI recommended serial hemoglobin checks. Acute stomal bleeding on12/3 treated with change and  placement of #8 cuffed trach. Blood pressures remain labile and A Fib treated with IV Cardizem. Dr [name] consulted for input on BP/afib management and recommended adding labetalol and ace inhibitors for better control. PMSV trials ongoing and patient with difficulty tolerating trials due to #8. He was downsized to a #6 cuffed trach.  Patient transferred to [facility] 12/22/10.  Patient currently requires total with basic self-care skills secondary to abnormal tone, ataxia and decreased motor planning, decreased visual acuity and decreased visual motor skills and decreased initiation, decreased attention, decreased awareness, decreased problem solving and delayed processing.  Prior to hospitalization, patient could complete ADL/self-care skills with independently.  Patient will benefit from skilled intervention to decrease level of assist with basic self-care skills and increase independence with basic self-care skills prior to discharge home with care partner.  Anticipate patient will require 24 hour supervision and follow up home health.  Goals set at min assist- supervision.  OT - End of Session Activity Tolerance: Tolerates 30+ min activity with multiple rests Endurance Deficit: Yes Endurance Deficit Description: pt very lethargic, difficulty remaining alert sitting EOB OT Assessment Rehab Potential: Good Barriers to Discharge: Decreased caregiver support OT Plan OT Frequency: 1-2 X/day, 60-90 minutes Estimated Length of Stay: 4 weeks OT Treatment/Interventions: Balance/vestibular training;Cognitive remediation/compensation;DME/adaptive equipment instruction;Functional mobility training;Neuromuscular re-education;Patient/family education;Self Care/advanced ADL retraining;Therapeutic Activities;Therapeutic Exercise;UE/LE Strength taining/ROM;UE/LE Coordination activities;Visual/perceptual remediation/compensation OT Recommendation Follow Up Recommendations: 24 hour supervision/assistance Equipment  Recommended: Other (comment) (TBD)  Precautions/Restrictions  Precautions Precautions: Fall;Other (comment) (trach collar) Required Braces or Orthoses: No Restrictions Weight Bearing Restrictions: No General Chart Reviewed: Yes Family/Caregiver Present: Yes (brother) Vital Signs   Pain Pain Assessment Pain Assessment: No/denies pain Pain Score: 0-No pain Home Living/Prior Functioning Home Living Lives With: Alone Type of Home:  House Home Layout: One level Home Access: Stairs to enter Entrance Stairs-Rails: Doctor, general practice of Steps: 3-4 Bathroom Toilet: Standard Prior Function Level of Independence: Independent with basic ADLs;Independent with homemaking with ambulation;Independent with gait Driving: Yes ADL ADL Eating: NPO Upper Body Bathing: Moderate cueing;Minimal assistance Where Assessed-Upper Body Bathing: Bed level Lower Body Bathing: Maximal assistance Where Assessed-Lower Body Bathing: Bed level Upper Body Dressing: Moderate assistance;Moderate cueing Where Assessed-Upper Body Dressing: Edge of bed Lower Body Dressing: Dependent Where Assessed-Lower Body Dressing: Bed level Toileting: Unable to assess (incontinent prior to session) Toilet Transfer: Unable to assess Tub/Shower Transfer: Unable to assess Vision/Perception  Vision - History Baseline Vision: Wears contacts Patient Visual Report: Blurring of vision;Diplopia Vision - Assessment Eye Alignment: Impaired (comment) Vision Assessment: Vision tested Ocular Range of Motion: Impaired-to be further tested in functional context Alignment/Gaze Preference: Gaze left;Head tilt;Chin down Tracking/Visual Pursuits: Decreased smoothness of vertical tracking;Decreased smoothness of horizontal tracking;Right eye does not track laterally;Left eye does not track medially Saccades: Impaired - to be further tested in functional context Convergence: Impaired - to be further tested in functional  context Additional Comments: ? nystagmus in both eyes with horizontal gaze at end range, frequent blinking Perception Perception: Impaired Inattention/Neglect: Does not attend to right visual field Praxis Praxis: Impaired Praxis Impairment Details: Initiation  Cognition Arousal/Alertness: Lethargic Sensation Sensation Light Touch: Impaired by gross assessment Stereognosis: Not tested Hot/Cold: Not tested Proprioception: Impaired by gross assessment Coordination Gross Motor Movements are Fluid and Coordinated: No Fine Motor Movements are Fluid and Coordinated: No Finger Nose Finger Test: Pt unable to complete secondary to decreased initiation and attention to task Motor  Motor Motor: Ataxia Mobility  Bed Mobility Bed Mobility: Yes Rolling Right: With rail;2: Max assist Rolling Right Details: Manual facilitation for weight shifting;Tactile cues for placement;Tactile cues for weight shifting;Verbal cues for sequencing;Verbal cues for technique Rolling Left: 3: Mod assist Rolling Left Details: Tactile cues for placement;Tactile cues for weight shifting;Verbal cues for sequencing;Verbal cues for technique;Manual facilitation for weight shifting Right Sidelying to Sit: 3: Mod assist Right Sidelying to Sit Details: Verbal cues for technique;Manual facilitation for weight shifting Sitting - Scoot to Edge of Bed: 3: Mod assist Sitting - Scoot to Edge of Bed Details: Verbal cues for sequencing;Verbal cues for technique;Tactile cues for weight shifting  Trunk/Postural Assessment     Balance   Extremity/Trunk Assessment RUE Assessment RUE Assessment: Exceptions to [facility] RUE AROM (degrees) Overall AROM Right Upper Extremity: Deficits RUE Overall AROM Comments: approx 90 degrees shoulder flexion, ataxic movements greater on Rt RUE PROM (degrees) Overall PROM Right Upper Extremity: Within functional limits for tasks performed RUE Strength RUE Overall Strength: Deficits RUE Overall  Strength Comments: unable to assess shoulder secondary to decreaed ROM, elbow 3/5 Gross Grasp: Impaired RUE Tone RUE Tone: Hypotonic LUE Assessment LUE Assessment: Exceptions to WFL LUE AROM (degrees) Overall AROM Left Upper Extremity: Deficits LUE Overall AROM Comments: approx 90 degrees shoulder flexion, elbow extension WFL LUE PROM (degrees) Overall PROM Left Upper Extremity: Within functional limits for tasks assessed LUE Strength LUE Overall Strength: Deficits LUE Overall Strength Comments: unable to assess shoulder secondary to decreased ROM, elbow approx 3/5 Gross Grasp: Impaired LUE Tone LUE Tone: Hypotonic  Recommendations for other services: None  Treatment Session:  OT eval and ADL assessment conducted. Bathing performed at bed level with focus on rolling Rt and Lt to assist with pericare.  Pt was incontinent of bowel prior to arrival and portion of session focused on cleaning patient.  UB dressing performed sitting EOB with LOB x2 to Rt with min assist to regain midline sitting.  Pt with decreased initiation, decreased problem solving, and ataxia with basic self-cares.  Discharge Criteria: Patient will be discharged from OT if patient refuses treatment 3 consecutive times without medical reason, if treatment goals not met, if there is a change in medical status, if patient makes no progress towards goals or if patient is discharged from hospital.  The above assessment, treatment plan, treatment alternatives and goals were discussed and mutually agreed upon: No family available/patient unable  Leonette Monarch 12/23/2010, 9:26 AM

## 2010-12-23 NOTE — Progress Notes (Signed)
Physical Therapy Session Note  Patient Details  Name: [name] MRN: [id number] Date of Birth: [birth year]  Today's Date: 12/23/2010 Time: 4098-1191 Time Calculation (min): 15 min  Precautions: Precautions Precautions: Fall Precaution Comments: trach on continuous O2, monitor HR and O2 saturation during mobility, significant extensor thrust Required Braces or Orthoses: No Restrictions Weight Bearing Restrictions: No Other Position/Activity Restrictions: monitor SpO2 and HR during therapeutic activities  Short Term Goals: PT Short Term Goal 1: Patient will perform bed mobility with mod assist. PT Short Term Goal 1 - Progress: Progressing toward goal PT Short Term Goal 2: Pt will maintain unsupported static sitting balance in midline > 5 min with supervision. PT Short Term Goal 2 - Progress: Progressing toward goal PT Short Term Goal 3: Patient will maintain dynamic sitting balance > 5 min with min assist. PT Short Term Goal 3 - Progress: Progressing toward goal PT Short Term Goal 4: Patient will tolerate OOB in chair > 60 min for increased activity tolerance. PT Short Term Goal 4 - Progress: Progressing toward goal PT Short Term Goal 5: Patient will maintain static standing balance > 1 min with mod assist. PT Short Term Goal 5 - Progress: Progressing toward goal  Skilled Therapeutic Interventions/Progress Updates:   Other Treatments  Patient extremely lethargic upon entry due to 2.5 hours of therapy this morning. Unable to remain alert or sustain attention due to level of fatigue. Re-positioned in recliner for pressure relief and neutral pelvis for symmetry. Missed 15 minutes of session. Will follow up this afternoon as able.  Therapy/Group: Individual Therapy  [name] 12/23/2010, 12:34 PM

## 2010-12-23 NOTE — Progress Notes (Signed)
   CARE MANAGEMENT NOTE 12/23/2010  Patient:  [name], [name]   Account Number:  [id number]  Date Initiated:  11/21/2010  Documentation initiated by:  [facility]  Subjective/Objective Assessment:   PT ADMITTED WITH ISCHEMIC CERBELLAR CVA; VENT DEPENDENT RESPIRATORY FAILURE.     Action/Plan:   Anticipated DC Date:  12/22/2010   Anticipated DC Plan:  IP REHAB FACILITY  In-house referral  Clinical Social Worker      DC Planning Services  CM consult      Choice offered to / List presented to:             Status of service:  Completed, signed off Medicare Important Message given?   (If response is "NO", the following Medicare IM given date fields will be blank) Date Medicare IM given:   Date Additional Medicare IM given:    Discharge Disposition:  IP REHAB FACILITY  Per UR Regulation:  Reviewed for med. necessity/level of care/duration of stay  Comments:  12/22/10 Diontae Route,RN,BSN PT APPROPRIATE FOR CIR PER REHAB MD AND MEDICALLY STABLE FOR TRANSFER.  SPOKE WITH GENIE ADMISSIONS RN FOR REHAB UNIT; BED AVAILABLE TODAY.  PETE BABCOCK, PA TO DO DISCHARGE SUMMARY, THEN PT CAN TRANSFER TO CIR.  12-22-10 Eulah Pont - [phone] UR completed - for CIR.  12-15-10 1:30pm Johny Shears - [phone] UR completed.  12-08-10 3:30pm Avie Arenas, RNBSN [phone] UR Completed.  tx to SDU on 12-06-10.  tx back to ICU on 12-07-10 and placed back on vent.  12-05-10 12:22pm Avie Arenas, RNBSN [phone] UR Completed.  12-02-10 2:45pm Avie Arenas, RNBSN [phone] UR Completed. - vent at night - GI consulted for PEG  11-28-10 9:30am Avie Arenas, RNBSN - [phone] UR Completed- trached today.  11-25-10 11:45am Avie Arenas, RNBSN [phone] UR Completed.  11-21-10 1:45pm Avie Arenas, RNBSN [phone] UR Completed.

## 2010-12-23 NOTE — Progress Notes (Addendum)
Notified Dan Angiulli, PA of patient's CBG of 79.  Notified PA that patient's tube feed had been put on hold on prior shift due to residual and that the tube feeding was restarted at a rate of 60mL per hour at 2000 as it was previously running.  Deatra Ina, PA ordered to hold the scheduled 4 units of novolog.  Advises to give the scheduled lantus.  Also notified PA that patient has an order for SSI and scheduled insulin every 4 hours but the order for CBG checks was AC/HS; new order to check CBGs q4hr.  Will continue to monitor.

## 2010-12-23 NOTE — Progress Notes (Addendum)
Physical Therapy Assessment and Plan  Patient Details  Name: [name] MRN: [id number] Date of Birth: 1956/01/12  PT Diagnosis: Abnormal posture, Abnormality of gait, Cognitive deficits, Contracture of joint: left heel cord, Coordination disorder, Impaired sensation and Muscle weakness Rehab Potential: Good ELOS: 4+ weeks   Today's Date: 12/23/2010 Time: 0865-7846 Time Calculation (min): 60 min  Assessment & Plan Clinical Impression: Patient is a 55 y.o. year old male with history of COPD, DM, a-fib, CAD, and gout with hospital admission for  Subacute right SCA CVA including midbrain and pons, acute large right cerebellar CVA with hemorrhagic transformation and edema compressing the 4th ventricle and mild hydrocephalus. Patient is s/p ventriculostomy to drain CSF on 11/24/10. Following the procedure, patient became unresponsive and comatose with CT positive for increased hemorrhage and right parietal infarct. Patient also had GIB on 12/15/10. Over hospital course, patient has been intubated and extubated several times with trach placed, currently with #6 cuff and PMV trials with SLP.   PTA pt lived alone in single-level home with no steps to enter. Patient was independent with all ADLs, IADLs, gait, and was driving. Currently retired from [employer] 5 years ago due to cardiac complications. Per patient and brother, brother will be able to assist as needed at discharge. Patient owns no DME at this time.  Patient currently presents to CIR with decrease postural control in unsupported sitting with strong posterior right lean and extensor thrust, decreased graded movements in all planes, all or none movements with increased time for initiation, decreased bilat LE eccentric control left > right, decreased sustained attention, increased processing time, decreased motor planning and sequencing of tasks with primitive reflex movement patterns, decreased cardiovascular endurance currently on  5L O2 with #6 trach and PMV trials with SLP, decreased sustained trunk and LE muscle contractions in unsupported positions, left heel cord contracture developing, and significantly decreased activity tolerance resulting in decreased functional independence.   Patient will benefit from skilled PT intervention to maximize safe functional mobility, minimize fall risk and decrease caregiver burden for planned discharge home with 24 hour min assist.  Anticipate patient will benefit from follow up [facility] at discharge.  PT - End of Session Activity Tolerance: Tolerates < 10 min activity with changes in vital signs Endurance Deficit: Yes Endurance Deficit Description: lethargic, fatigues quickly, decreased O2 saturation on 5L O2 with movement PT Assessment Rehab Potential: Good Barriers to Discharge: None PT Plan PT Frequency: 2-3 X/day, 60-90 minutes Estimated Length of Stay: 4+ weeks PT Treatment/Interventions: Ambulation/gait training;Balance/vestibular training;Cognitive remediation/compensation;DME/adaptive equipment instruction;Community reintegration;Functional mobility training;Neuromuscular re-education;Patient/family education;Therapeutic Exercise;Therapeutic Activities;Stair training;Splinting/orthotics;UE/LE Strength taining/ROM;UE/LE Coordination activities;Visual/perceptual remediation/compensation;Wheelchair propulsion/positioning PT Recommendation Recommendations for Other Services: Other (comment) (N/A at this time) Follow Up Recommendations: Home health PT Equipment Recommended: Wheelchair (measurements);Wheelchair cushion (measurements);Other (comment) (Gait equipment TBD)  Precautions/Restrictions Precautions Precautions: Fall Precaution Comments: trach on continuous O2, monitor HR and O2 saturation during mobility, significant extensor thrust Required Braces or Orthoses: No Restrictions Weight Bearing Restrictions: No Other Position/Activity Restrictions: monitor SpO2 and HR  during therapeutic activities General Chart reviewed- yes Vital Signs Therapy Vitals Pulse Rate:  (68-73 bpm during mobility) Oxygen Therapy SpO2:  (81-100% during mobility) O2 Device: Trach collar FiO2 (%): 28 % O2 Flow Rate (L/min): 5 L/min Pulse Oximetry Type: Continuous Pain Pain Assessment Pain Assessment: No/denies pain Pain Score: 0-No pain Home Living/Prior Functioning Home Living Lives With: Alone Receives Help From:  (brother able to provide assist at discharge) Type of Home: House Home Layout: One level  Home Access: Level entry Entrance Stairs-Rails: None Entrance Stairs-Number of Steps: 3-4 Bathroom Toilet: Standard Home Adaptive Equipment: None Prior Function Level of Independence: Independent with basic ADLs;Independent with homemaking with ambulation;Independent with transfers;Independent with gait Able to Take Stairs?: Yes Driving: Yes Vocation: On disability Comments: Has two large dogs Vision/Perception  Vision - History Baseline Vision: No visual deficits Patient Visual Report: Blurring of vision;Diplopia Vision - Assessment Eye Alignment: Within Functional Limits (intermittent lateral 4-beat nystagmus at end range with lateral scanning) Vision Assessment: Vision tested Ocular Range of Motion: Impaired-to be further tested in functional context Alignment/Gaze Preference: Gaze left;Head tilt;Chin down Tracking/Visual Pursuits: Decreased smoothness of vertical tracking;Decreased smoothness of horizontal tracking Saccades: Impaired - to be further tested in functional context, tends to overshoot Perception Perception: Impaired Inattention/Neglect: Appears intact Spatial Orientation: decreased perception of  body in space Praxis Praxis: Impaired Praxis Impairment Details: Motor planning;Initiation  Cognition Arousal/Alertness: Lethargic, sustained attention level for approximately 3-5 min at this time. Sensation Sensation Light Touch: Impaired  Detail Light Touch Impaired Details: Impaired LLE Stereognosis: Not tested Hot/Cold: Not tested Proprioception: Impaired Detail Proprioception Impaired Details: Absent LLE Additional Comments: sensation decreased to light touch and deep pressure throughout left LE Coordination Gross Motor Movements are Fluid and Coordinated: No Fine Motor Movements are Fluid and Coordinated: No Coordination and Movement Description: poor graded movements of trunk and left LE, dysmetria left LE Finger Nose Finger Test: dysmetria with left UE reaching to target, decreased left shoulder AROM noted Heel Shin Test: dysmetria left LE Motor  Motor Motor: Abnormal postural alignment and control;Abnormal tone;Motor impersistence;Primitive reflexes present Motor - Skilled Clinical Observations: significant extensor thrust in decerebrate pattern left LE, UE, and trunk, poor graded movement of trunk and left LE and UE, slow to initiate, all or none movement  Mobility Bed Mobility Bed Mobility: Yes Rolling Right: 2: Max assist;With rail Rolling Right Details: Manual facilitation for weight shifting;Tactile cues for placement;Tactile cues for weight shifting;Verbal cues for sequencing;Verbal cues for technique Rolling Left: 3: Mod assist Rolling Left Details: Tactile cues for placement;Tactile cues for weight shifting;Verbal cues for sequencing;Verbal cues for technique;Manual facilitation for weight shifting Right Sidelying to Sit: With rails;2: Max assist Right Sidelying to Sit Details: Verbal cues for technique;Manual facilitation for weight shifting Sitting - Scoot to Edge of Bed: 1: +1 Total assist;Other (comment) (significant posterior right lean) Sitting - Scoot to Edge of Bed Details: Verbal cues for sequencing;Verbal cues for technique;Tactile cues for weight shifting Transfers Sit to Stand: 1: +1 Total assist Sit to Stand Details (indicate cue type and reason): significant posterior right lean, slow to  initiate movement Stand to Sit: 1: +1 Total assist Stand Pivot Transfer Details (indicate cue type and reason): poor LE eccentric control with uncontrolled descent, significant posterior right lean Squat Pivot Transfers: 1: +2 Total assist Squat Pivot Transfer Details (indicate cue type and reason): total assist + 2 (pt = 50%), poor graded movement and weight shift, slow to initiate with LEs Locomotion  Ambulation Ambulation:  (pre-gait activities with total assist + 1) Stairs / Additional Locomotion Stairs: No (pre-gait level at this time, unsafe to attempt) Wheelchair Mobility Wheelchair Mobility: No  Trunk/Postural Assessment  Cervical Assessment Cervical Assessment: Within Functional Limits Thoracic Assessment Thoracic Assessment:  (right lateral flexion, increased extension) Lumbar Assessment Lumbar Assessment:  (anterior tilted, increased weight bearing right side) Postural Control Postural Control: Deficits on evaluation Trunk Control: significant posterior right lean with uncontrolled movements out of this position Righting Reactions: absent Protective Responses: absent  Balance Balance Balance Assessed: Yes Static Sitting Balance Static Sitting - Balance Support: Bilateral upper extremity supported Static Sitting - Level of Assistance: 4: Min assist Static Sitting - Comment/# of Minutes: posterior right lean, difficulty maintaining midline Dynamic Sitting Balance Dynamic Sitting - Balance Support: Right upper extremity supported;Feet supported (reaching all planes) Dynamic Sitting - Level of Assistance: 3: Mod assist Dynamic Sitting Balance - Compensations: poor graded weight shifts, all or none movement at this time Static Standing Balance Static Standing - Balance Support: Bilateral upper extremity supported Static Standing - Level of Assistance: 1: +1 Total assist Static Standing - Comment/# of Minutes: significant posterior right lean Dynamic Standing  Balance Dynamic Standing - Balance Support: Bilateral upper extremity supported Dynamic Standing - Level of Assistance: 1: +1 Total assist Dynamic Standing - Balance Activities: Lateral lean/weight shifting;Forward lean/weight shifting;Other (comment) (step forward with left LE) Dynamic Standing - Comments: significant posterior right lean, poor graded weight shifts, fatigues quickly (approximately 15-20 seconds) Extremity Assessment  RUE Assessment RUE Assessment: Exceptions to [facility] RUE AROM (degrees) Overall AROM Right Upper Extremity: Deficits RUE Overall AROM Comments: approx 90 degrees shoulder flexion, ataxic movements greater on Rt RUE PROM (degrees) Overall PROM Right Upper Extremity: Within functional limits for tasks performed RUE Strength RUE Overall Strength: Deficits RUE Overall Strength Comments: unable to assess shoulder secondary to decreaed ROM, elbow 3/5 Gross Grasp: Impaired RUE Tone RUE Tone: Hypotonic LUE Assessment LUE Assessment: Exceptions to WFL LUE AROM (degrees) Overall AROM Left Upper Extremity: Deficits LUE Overall AROM Comments: approx 90 degrees shoulder flexion, elbow extension WFL LUE PROM (degrees) Overall PROM Left Upper Extremity: Within functional limits for tasks assessed LUE Strength LUE Overall Strength: Deficits LUE Overall Strength Comments: unable to assess shoulder secondary to decreased ROM, elbow approx 3/5 Gross Grasp: Impaired LUE Tone LUE Tone: Hypotonic RLE Assessment RLE Assessment: Within Functional Limits (grossly 4/5) RLE PROM (degrees) Overall PROM Right Lower Extremity: Within functional limits for tasks assessed RLE Strength RLE Overall Strength: Within Functional Limits for tasks assessed RLE Tone RLE Tone: Within Functional Limits LLE Assessment LLE Assessment: Exceptions to [facility] LLE PROM (degrees) Overall PROM Left Lower Extremity: Deficits;Other (Comment) (left heel cord contracture, ankle DF to neutral only) LLE  Strength LLE Overall Strength: Deficits LLE Overall Strength Comments: grossly 3/5 except ankle DF 2/5 due to decreased ROM LLE Tone LLE Tone: Mild;Other (comment) (decerebrate posturing and movement patterns)  Treatment initiated during session: Static sitting balance EOB with bilat UEs supported on PT's knees, PT sitting in front of patient, facilitation for anterior weight shift and maintaining midline mod assist. Sit to stand x 3 trials with max-total assist, manual facilitation at rib cage to increase anterior weight shift and maintain midline. O2 decreased to low 80s in standing after approximately 15-20 seconds on continuous 5L O2. Returns to > 93% once seated in < 30 seconds. Strong extensor thrust in standing with poor graded movements and decreased perception of body in space noted. Squat pivot transfer to recliner at end of session with total assist + 2, pt = 50 %, facilitation for anterior weight shift, initiation and sequencing of task.  Recommendations for other services: None  Discharge Criteria: Patient will be discharged from PT if patient refuses treatment 3 consecutive times without medical reason, if treatment goals not met, if there is a change in medical status, if patient makes no progress towards goals or if patient is discharged from hospital.  The above assessment, treatment plan, treatment alternatives and goals were discussed  and mutually agreed upon: by patient and by family  [name] 12/23/2010, 11:11 AM

## 2010-12-23 NOTE — Progress Notes (Signed)
Pt was give bed bath, max assist with all ADL's

## 2010-12-23 NOTE — Progress Notes (Signed)
Stand pivot x2 mod Assist. Up in chair for approximately 5-6 hours. Tolerated well.  Incontinent stools. Predominantly mucous with small brown content. Discussed this with Marissa Nestle, PA. Orders for Abd. X-ray. Peg tube feedings bolus begun at noon at gravity. Goal to 320 ml 5xdaily.  No residual with all checks today. #6 shilley changed. Trach site cleaned with peroxide. Oral care performed. Thrush noted. Pam Love informed. Order obtained. Trach collar at 28%. O2 sats 100%.  Pt participated with therapies. Continue plan of care. Pt room changed to 4030 to be closer to desk.

## 2010-12-23 NOTE — Progress Notes (Signed)
Speech Pathology  PMSV (Passy-Muir Speaking Valve) Evaluation  12/23/10 Individual Session 1345-1430  I. HPI:  [name] is an 55 y.o. male with H/O COPD, DM, A fib, CHF, CAD, HTN, Gout admitted 11/19/2010 with acute onset of slurred speech and left sided weakness. CT head without acute abnormality. Patient became diaphoretic with increased WOB with inability to speak, LUE flaccidity while in Xray. He was intubated for airway support. CT angio chest without PE and evidence of CHF. CTA head and neck without thrombus, distal left vertebral artery stenosis and basilar artery stenosis. MRI brain with acute subacute infarct right SCA territory including mid brain and upper pons. 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 Dr Phoebe Perch consulted and recommended reversing coumadin and heparin.  On 11/10 patient, had ventriculostomy placed with good drainage of CSF. Patient comatose and unresponsive. Patient trached by Dr Pollyann Kennedy on 11/16 and PEG placed 11/21 by Dr. Juanda Chance. Developed fevers and diarrhea, started on flagyl for questionable C diff. Ventilator wean initiated. On, 11/24 with worsening of MS and repeat CT head with slight increase in hemorrhage and questionable new right parietal infarct and patient reintubated. Dr Phoebe Perch consulted and no surgical intervention recommended. Vent wean initiated 11/28 and patient tolerated extubation. Noted to have blood in stools with questionable lower GIB. GI recommended serial hemoglobin checks. Acute stomal bleeding on12/3 treated with change and placement of #8 cuffed trach. Blood pressures remain labile and A Fib treated with IV Cardizem. Dr [name] consulted for input on BP/afib management and recommended adding labetalol and ace inhibitors for better control. PMSV trials ongoing on acute rehab  and patient with difficulty tolerating trials due to #8. Transferred to CIR 12/22/10. He was downsized to a #6 cuffed trach 12/23/10 and patient evaluated today for toleration of PMSV given recent down size of trach.   II. Tracheostomy Tube  Initial trach Placement date: 11/28/10  Type: Shiley     Size: #6 cuffed    FiO2: 28%  Cuff: yes Fenestration:  no Secretion description: clear Frequency of tracheal suctioning:1 per daily   II. Ventilator Dependency  no Weaning Trials:  N/A Vent Mode:        Nocturnal Vent: N/A Vt:     Rate:  PEEP:    PS:   FiO2:   IV. Cuff Deflation Trials  yes for 60  minutes  V. PMSV Trial PMSV was placed for 45 minutes. Able to redirect subglottic air through upper airway: yes Able to attain phonation with PMSV: yes Able to expectorate secretions with PMSV: not observed during session; no need Breath Support for phonation: reduced at this time Intelligibiity: reduced; max assist RR: WFL SpO2: 92-100; averaged 88 for most of session HR:WFL  Behavior: responsive to questions  VI. Clinical Impression:  Patient presents with trach placement, which has impacted his ability to functionally communicate his wants, needs and ideas.  As a result, he would benefit from skilled SLP services to increase functional communication by participating in continued trails and advancement of PMSV placement, use of his upper airway and phonation, which will enable him to express his wants and needs with more independence.    Therapy Diagnosis:  Communication impairment  VII. Recommendations MD- Consider changing tracheostomy tube to a Shiley 6 cuff less  1. Patient to use PMSV with all therapies with conintuous SpO2 monitoring and saturation greater than 90%  2. Patient to use PMSV during all waking hours with  conintuous SpO2 monitoring and saturation greater than 90% starting 12/24/10  VIII. Treatment Plan  yes Frequency/Duration: 1-2 sessions per day, 5 out of 7 days a  week, 60-90 minutes  Short Term Goals 1. Patient will use PMSV during all waking hours with  conintuous SpO2 monitoring and saturation greater than 90% starting 12/24/10 2. Patient will don and doff PMSV with moderate assist semantic and tactile cues  Pain: no a 0 on a scale of 0-10  Charlane Ferretti., CCC-SLP [phone]

## 2010-12-23 NOTE — Progress Notes (Signed)
Patient's butterfly wish is to be able to eat and drink again.  Pt suctioned for small amount of thick white sputum.  Trach care performed and inner cannula changed.  Will continue to monitor.

## 2010-12-23 NOTE — Progress Notes (Signed)
Patient states abdominal pain was resolved after a small bowel movement.  Will continue to monitor.

## 2010-12-23 NOTE — Plan of Care (Signed)
Problem: RH SKIN INTEGRITY Goal: RH STG SKIN FREE OF INFECTION/BREAKDOWN Outcome: Progressing Strict peri care due to incontinence.

## 2010-12-23 NOTE — Progress Notes (Signed)
Patient ID: [name], male   DOB: [birth year], 55 y.o.   MRN: [id number] Subjective/Complaints: Answers Y/N appropriate.  [place] simple word level answers Review of Systems  HENT: Positive for congestion.        Trach #6 cuffed  Gastrointestinal: Positive for nausea.       PEG  Genitourinary: Positive for frequency.       Foley  All other systems reviewed and are negative.    Objective: Vital Signs: Blood pressure 164/89, pulse 95, temperature 98 F (36.7 C), temperature source Oral, resp. rate 20, height 6' (1.829 m), weight 92.8 kg (204 lb 9.4 oz), SpO2 100.00%. No results found. Results for orders placed during the hospital encounter of 12/22/10 (from the past 72 hour(s))  GLUCOSE, CAPILLARY     Status: Abnormal   Collection Time   12/22/10  5:09 PM      Component Value Range Comment   Glucose-Capillary 62 (*) 70 - 99 (mg/dL)   GLUCOSE, CAPILLARY     Status: Abnormal   Collection Time   12/22/10  5:52 PM      Component Value Range Comment   Glucose-Capillary 119 (*) 70 - 99 (mg/dL)   GLUCOSE, CAPILLARY     Status: Normal   Collection Time   12/22/10  8:30 PM      Component Value Range Comment   Glucose-Capillary 79  70 - 99 (mg/dL)   URINALYSIS, ROUTINE W REFLEX MICROSCOPIC     Status: Abnormal   Collection Time   12/22/10  8:44 PM      Component Value Range Comment   Color, Urine YELLOW  YELLOW     APPearance CLEAR  CLEAR     Specific Gravity, Urine 1.018  1.005 - 1.030     pH 6.0  5.0 - 8.0     Glucose, UA NEGATIVE  NEGATIVE (mg/dL)    Hgb urine dipstick NEGATIVE  NEGATIVE     Bilirubin Urine NEGATIVE  NEGATIVE     Ketones, ur NEGATIVE  NEGATIVE (mg/dL)    Protein, ur 30 (*) NEGATIVE (mg/dL)    Urobilinogen, UA 1.0  0.0 - 1.0 (mg/dL)    Nitrite NEGATIVE  NEGATIVE     Leukocytes, UA NEGATIVE  NEGATIVE    URINE MICROSCOPIC-ADD ON     Status: Abnormal   Collection Time   12/22/10  8:44 PM      Component Value Range Comment   WBC, UA 0-2  <3 (WBC/hpf)    RBC / HPF 0-2  <3 (RBC/hpf)    Bacteria, UA RARE  RARE     Crystals CA OXALATE CRYSTALS (*) NEGATIVE     Urine-Other AMORPHOUS URATES/PHOSPHATES     GLUCOSE, CAPILLARY     Status: Normal   Collection Time   12/22/10 11:57 PM      Component Value Range Comment   Glucose-Capillary 80  70 - 99 (mg/dL)   GLUCOSE, CAPILLARY     Status: Normal   Collection Time   12/23/10  4:13 AM      Component Value Range Comment   Glucose-Capillary 98  70 - 99 (mg/dL)    Comment 1 Notify RN     CBC     Status: Abnormal   Collection Time   12/23/10  4:20 AM      Component Value Range Comment   WBC 8.2  4.0 - 10.5 (K/uL)    RBC 2.95 (*) 4.22 - 5.81 (MIL/uL)    Hemoglobin 8.2 (*) 13.0 -  17.0 (g/dL)    HCT 96.0 (*) 45.4 - 52.0 (%)    MCV 89.5  78.0 - 100.0 (fL)    MCH 27.8  26.0 - 34.0 (pg)    MCHC 31.1  30.0 - 36.0 (g/dL)    RDW 09.8  11.9 - 14.7 (%)    Platelets 325  150 - 400 (K/uL)   COMPREHENSIVE METABOLIC PANEL     Status: Abnormal   Collection Time   12/23/10  4:20 AM      Component Value Range Comment   Sodium 146 (*) 135 - 145 (mEq/L)    Potassium 2.9 (*) 3.5 - 5.1 (mEq/L)    Chloride 105  96 - 112 (mEq/L)    CO2 33 (*) 19 - 32 (mEq/L)    Glucose, Bld 119 (*) 70 - 99 (mg/dL)    BUN 16  6 - 23 (mg/dL)    Creatinine, Ser 8.29  0.50 - 1.35 (mg/dL)    Calcium 9.3  8.4 - 10.5 (mg/dL)    Total Protein 7.1  6.0 - 8.3 (g/dL)    Albumin 2.4 (*) 3.5 - 5.2 (g/dL)    AST 20  0 - 37 (U/L)    ALT 23  0 - 53 (U/L)    Alkaline Phosphatase 92  39 - 117 (U/L)    Total Bilirubin 0.2 (*) 0.3 - 1.2 (mg/dL)    GFR calc non Af Amer 90 (*) >90 (mL/min)    GFR calc Af Amer >90  >90 (mL/min)   DIFFERENTIAL     Status: Normal   Collection Time   12/23/10  4:20 AM      Component Value Range Comment   Neutrophils Relative 56  43 - 77 (%)    Neutro Abs 4.6  1.7 - 7.7 (K/uL)    Lymphocytes Relative 33  12 - 46 (%)    Lymphs Abs 2.7  0.7 - 4.0 (K/uL)    Monocytes Relative 8  3 - 12 (%)    Monocytes  Absolute 0.7  0.1 - 1.0 (K/uL)    Eosinophils Relative 3  0 - 5 (%)    Eosinophils Absolute 0.2  0.0 - 0.7 (K/uL)    Basophils Relative 0  0 - 1 (%)    Basophils Absolute 0.0  0.0 - 0.1 (K/uL)   GLUCOSE, CAPILLARY     Status: Abnormal   Collection Time   12/23/10  7:31 AM      Component Value Range Comment   Glucose-Capillary 110 (*) 70 - 99 (mg/dL)    Comment 1 Notify RN         Blood pressure 120/98, pulse 86, temperature 98.4 F (36.9 C), temperature source Oral, resp. rate 22, height 6' (1.829 m), weight 90 kg (198 lb 6.6 oz), SpO2 100.00%.  Patient sitting comfortably in his chair with #6 Trach in Pl. cuffed. No visible secretions of my evaluation. Ears and throat notable for trach as well as thrush over the tongue. Patient has weak cough with trach in place. We did not occluded this evaluation. He is fair oral motor control. No gross cranial nerve abnormalities I can see on exam today. Patient was able to follow simple one-step commands. He communicated with simple yes no and shakes. He is nonverbal. He had diminished fine motor movement in all 4 limbs. He may have had some underlying ataxia on the right posterior difficult to discern given his extensive weakness throughout. Strength was grossly 2+ to 3/5 proximally in both upper  limbs. Lower extremity grossly 1-2/5 proximal to 2/5 distally. Patient did have sensation to gross pain stimulation. Reflexes are 1+ grossly throughout. Patient had poor insight and and awareness. Memory is difficult to assess given his language issues although it appeared to be impaired.  Heart was irregularly irregular. Chest was grossly clear with a few scattered rhonchi. Abdomen soft nontender. PEG site was clean and intact with minimal drainage. Skin throughout was generally intact. Mild edema in all 4 limbs at one plus, nonpitting. All 4 limbs were warm with 2+ pulses.    Assessment/Plan: 1. Functional deficits secondary to R cerebellar ICH severe R UE and R  LE ataxia which require 3+ hours per day of interdisciplinary therapy in a comprehensive inpatient rehab setting. Physiatrist is providing close team supervision and 24 hour management of active medical problems listed below. Physiatrist and rehab team continue to assess barriers to discharge/monitor patient progress toward functional and medical goals. Mobility: Bed Mobility Bed Mobility: Yes Rolling Right: With rail;2: Max assist Rolling Left: 3: Mod assist Right Sidelying to Sit: 3: Mod assist Sitting - Scoot to Edge of Bed: 3: Mod assist        ADL:    Cognition: Cognition Arousal/Alertness: Lethargic Orientation Level: Disoriented to person;Disoriented to place Cognition Arousal/Alertness: Lethargic Orientation Level: Disoriented to person;Disoriented to place  2. Anticoagulation/DVT prophylaxis with Pharmaceutical: Other (comment)no anticoag use SCDs 3. Pain Management:monitor Patient Active Hospital Problem List: 4.  Afib cannot use warfarin due to ICH Diabetes mellitus (11/19/2010)    POA: Yes    Plan: Lantus monitor CBG Respiratory failure (11/19/2010)    POA: Yes   Assessment: trach wean as able    Gout (11/20/2010)    POA: Yes   Assessment: inactive   Plan: monitor ICH (intracerebral hemorrhage) (12/22/2010)    POA: No   Assessment: R hemiataxia non progressive   Plan: no anticoag   1  Taliana Mersereau E 12/23/2010, 9:12 AM

## 2010-12-23 NOTE — Progress Notes (Signed)
Gastric residual <70mL.  Patient complaining of mild abdominal pain.  CBG 80.  Notified Deatra Ina, PA of abdominal discomfort and patient CBG.  Ordered to hold the scheduled 4 units of novolog and to monitor the abdominal pain.  Will continue to monitor.

## 2010-12-23 NOTE — H&P (Signed)
Physical Medicine and Rehabilitation Admission H&P  Chief Complaint   Patient presents with   .  Cerebrovascular Accident   :  HPI: [name] is an 55 y.o. male with H/O COPD, DM, A fib, admitted 11/19/2010 with acute onset of slurred speech and left sided weakness. CT head without acute abnormality. Patient became diaphoretic with increased WOB with inability to speak, LUE flaccidity while in Xray. He was intubated for airway support. CT angio chest without PE and evidence of CHF. CTA head and neck without thrombus, distal left vertebral artery stenosis and basilar artery stenosis. MRI brain with acute subacute infarct right SCA territory including mid brain and upper pons. 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 Dr Phoebe Perch consulted and recommended reversing coumadin and heparin.  On 11/10 patient, had ventriculostomy placed with good drainage of CSF. Patient comatose and unresponsive. Patient trached by Dr Pollyann Kennedy on 11/16 and PEG placed 11/21 by Dr. Juanda Chance. Developed fevers and diarrhea, started on flagyl for ? C diff. Ventilator wean initiated On, 11/24 with worsening of MS and repeat CT head with slight increase in hemorrhage and ?new right parietal infarct and patient reintubated. Dr Phoebe Perch consulted and no surgical intervention recommended. Vent wean initiated 11/28 and patient tolerated extubation. Noted to have blood in stools with ?lower GIB. GI recommended serial hemoglobin checks. Acute stomal bleeding on12/3 treated with change and placement of #8 cuffed trach. Blood pressures remain labile and A Fib treated with IV Cardizem. Dr [name] consulted for input on BP/afib management and recommended adding labetalol and ace inhibitors for better control. PMSV trials ongoing and patient with difficulty tolerating trials  due to #8. He was downsized to a #6 cuffed trach today.  ROS:  Patient denies any chest pain or shortness of breath or pain today. Other pertinent positive for above in the history of present illness. Patient's a poor historian given his language and cognitive deficits.  Past Medical History   Diagnosis  Date   .  Diabetes mellitus    .  Coronary artery disease    .  Hypertension    .  Gout    .  CHF (congestive heart failure)    .  Afib    .  Arthritis    .  Hypertensive emergency  11/19/2010   .  Pulmonary edema  11/19/2010   .  Acute exacerbation of congestive heart failure  11/19/2010   .  Thyroiditis  11/20/2010   .  ICH (intracerebral hemorrhage)  12/22/2010   .  Stroke  11/19/2010   .  Respiratory failure  11/19/2010   .  CAD (coronary artery disease)  11/20/2010   .  Physical deconditioning  12/22/2010   .  Diabetes mellitus  11/19/2010   .  A-fib  11/19/2010   .  Hyperlipemia  11/21/2010    Past Surgical History   Procedure  Date   .  Tracheostomy tube placement  11/28/2010     Procedure: TRACHEOSTOMY; Surgeon: Susy Frizzle, MD; Location: [facility] OR; Service: ENT; Laterality: N/A;   .  Peg placement  12/03/2010     Procedure: PERCUTANEOUS ENDOSCOPIC GASTROSTOMY (PEG) PLACEMENT; Surgeon: Hart Carwin, MD; Location: [facility] ENDOSCOPY; Service: Endoscopy; Laterality: N/A;    History reviewed. No pertinent family history.  Social History: .Single. Retired from [employer] 5 years ago. He reports that  he quit smoking about 5 years ago. His smoking use included Cigarettes. He does not have any smokeless tobacco history on file. He reports that he drinks about 6.6 ounces of alcohol per week. He reports that he does not use illicit drugs. Has a brother in [place] who's retired. Maybe able to assist?  Allergies: No Known Allergies  Prior to Admission medications   Medication  Sig  Start Date  End Date  Taking?  Authorizing Provider   allopurinol (ZYLOPRIM) 300 MG tablet  Take 300 mg by  mouth daily.    Yes  Historical Provider, MD   amLODipine (NORVASC) 5 MG tablet  Take 5 mg by mouth daily.    Yes  Historical Provider, MD   AMOXICILLIN PO  Take 400 mg by mouth 2 (two) times daily.    Yes  Historical Provider, MD   aspirin 81 MG tablet  Take 81 mg by mouth daily.    Yes  Historical Provider, MD   carvedilol (COREG) 25 MG tablet  Take 25 mg by mouth 2 (two) times daily with a meal.    Yes  Historical Provider, MD   celecoxib (CELEBREX) 200 MG capsule  Take 200 mg by mouth 2 (two) times daily.    Yes  Historical Provider, MD   cloNIDine (CATAPRES) 0.1 MG tablet  Take 0.1 mg by mouth 2 (two) times daily.    Yes  Historical Provider, MD   colchicine 0.6 MG tablet  Take 0.6 mg by mouth daily. For gout    Yes  Historical Provider, MD   cyclobenzaprine (FLEXERIL) 10 MG tablet  Take 5-10 mg by mouth 2 (two) times daily as needed. For muscle spasms    Yes  Historical Provider, MD   furosemide (LASIX) 20 MG tablet  Take 10-20 mg by mouth 2 (two) times daily. For pressure and fluid    Yes  Historical Provider, MD   isosorbide-hydrALAZINE (BIDIL) 20-37.5 MG per tablet  Take 1 tablet by mouth 3 (three) times daily.    Yes  Historical Provider, MD   metFORMIN (GLUCOPHAGE-XR) 500 MG 24 hr tablet  Take 1,000 mg by mouth 2 (two) times daily.    Yes  Historical Provider, MD   Multiple Vitamins-Minerals (MULTIVITAMINS THER. W/MINERALS) TABS  Take 1 tablet by mouth daily.    Yes  Historical Provider, MD   naproxen sodium (ANAPROX) 220 MG tablet  Take 220 mg by mouth 2 (two) times daily as needed. For pain    Yes  Historical Provider, MD   ramipril (ALTACE) 10 MG capsule  Take 10 mg by mouth 2 (two) times daily.    Yes  Historical Provider, MD   spironolactone (ALDACTONE) 50 MG tablet  Take 50 mg by mouth 2 (two) times daily.    Yes  Historical Provider, MD   warfarin (COUMADIN) 7.5 MG tablet  Take 7.5 mg by mouth daily.    Yes  Historical Provider, MD   Scheduled Medications:   .  carvedilol  25 mg   Per Tube  BID WC   .  diltiazem  90 mg  Per Tube  Q6H   .  free water  200 mL  Per Tube  Q8H   .  insulin aspart  0-20 Units  Subcutaneous  Q4H   .  insulin aspart  4 Units  Subcutaneous  Q4H   .  insulin glargine  20 Units  Subcutaneous  BID   .  isosorbide-hydrALAZINE  2 tablet  Oral  TID   .  pantoprazole sodium  40 mg  Per Tube  Q1200   .  potassium chloride  20 mEq  Oral  Daily   .  ramipril  10 mg  Per Tube  BID   .  rosuvastatin  5 mg  Oral  q1800   .  sodium chloride  10 mL  Intracatheter  Q12H   .  DISCONTD: antiseptic oral rinse  15 mL  Mouth Rinse  QID   .  DISCONTD: chlorhexidine  15 mL  Mouth Rinse  BID   .  DISCONTD: free water  200 mL  Per Tube  Q8H    PRN MED's:  acetaminophen (TYLENOL) oral liquid 160 mg/5 mL, ALPRAZolam, bisacodyl, DISCONTD: labetalol, DISCONTD: metoprolol  Home:  Home Living  Lives With: Alone  Type of Home: House  Home Layout: One level  Home Access: Stairs to enter  Entrance Stairs-Rails: Engineer, building services of Steps: 3-4  Bathroom Toilet: Standard  Home Adaptive Equipment: Crutches  Functional History:  Prior Function  Level of Independence: Independent with basic ADLs;Independent with homemaking with ambulation;Independent with gait  Driving: Yes  Functional Status:  Mobility:  Bed Mobility  Bed Mobility: Yes  Rolling Right: 1: +2 Total assist;Patient percentage (comment);Other (comment);With rail (pt=40%)  Rolling Right Details (indicate cue type and reason): Hand over hand to facilitation to complete roll with upper extremity reach  Rolling Left: 3: Mod assist  Rolling Left Details (indicate cue type and reason):  Right Sidelying to Sit: (pt=50%)  Right Sidelying to Sit Details (indicate cue type and reason): vc's for hand placement; manual A for truncal control  Supine to Sit: 1: +2 Total assist;Patient percentage (comment);HOB flat;Other (comment) (pt =50%)  Supine to Sit Details (indicate cue type and reason): vc  for technique (to come over to the L elbow); manual A for truncal A  Sitting - Scoot to Edge of Bed: 3: Mod assist  Transfers  Transfers: Yes  Sit to Stand: 1: +2 Total assist;Patient percentage (comment);From bed;With upper extremity assist (pt=45%)  Sit to Stand Details (indicate cue type and reason): Max verbal cues for hand placement and technique  Stand to Sit: 1: +2 Total assist;Patient percentage (comment);To bed (pt > 50%)  Stand Pivot Transfers: 1: +2 Total assist;Patient percentage (comment);From elevated surface;Other (comment) (pt =45%)  Stand Pivot Transfer Details (indicate cue type and reason): vc's for hand placement; manual A for forward w/shift and side to side w/shift  Squat Pivot Transfers: 1: +2 Total assist;Patient percentage (comment);Other (comment) (pt =>40%)  Squat Pivot Transfer Details (indicate cue type and reason): vc's for hand placement; manual A for support and w/shift for pivotal steps  Ambulation/Gait  Ambulation/Gait: No   ADL:  ADL  Eating/Feeding: NPO  Grooming: Performed;Wash/dry face;Moderate assistance  Grooming Details (indicate cue type and reason): Pt. provided with support under rt. elbow and hand over hand facilitation to maintain grasp of wash cloth (due to pt. with ataxia)  Where Assessed - Grooming: Unsupported;Sitting, bed (close supervision)  Upper Body Bathing: Simulated;Chest;Right arm;Left arm;Abdomen;+1 Total assistance  Where Assessed - Upper Body Bathing: Supine, head of bed up  Lower Body Bathing: Simulated;+1 Total assistance  Where Assessed - Lower Body Bathing: Supine, head of bed up  Upper Body Dressing: Performed;Maximal assistance  Upper Body Dressing Details (indicate cue type and reason): With donning gown and mod verbal cues for inititation and completiong of task  Where Assessed - Upper Body Dressing: Unsupported;Sitting, bed (close  supervision)  Lower Body Dressing: +1 Total assistance;Performed  Lower Body Dressing  Details (indicate cue type and reason): Pt. unable to don/doff socks due to decreased functional use of bilateral UE and trunk weakness  Where Assessed - Lower Body Dressing: Sit to stand from bed  Toilet Transfer: Simulated;+2 Total assistance;Comment for patient % (pt=40%)  Toilet Transfer Details (indicate cue type and reason): Max verbal and hand over hand cues for hand placement and transfer technique  Toilet Transfer Method: Stand pivot  Toilet Transfer Equipment: Other (comment) Nurse, children's)  Toileting - Clothing Manipulation: Not assessed  Toileting - Hygiene: Performed;+1 Total assistance  Toileting - Hygiene Details (indicate cue type and reason): Pt. incontinent of bowels upon arrival and with rolling  Where Assessed - Toileting Hygiene: Rolling right and/or left  Tub/Shower Transfer: Not assessed  ADL Comments: Pt. completed EOB dynamic balance activities to facilitate increased trunk control and bilateral UE reaching to increase functional use by controlling movements due to Rt. UE ataxia>lt. UE  Cognition:  Cognition  Arousal/Alertness: Lethargic  Orientation Level: Oriented to person;Oriented to place  Cognition  Arousal/Alertness: Lethargic  Overall Cognitive Status: Difficult to assess (Due to pt. with decreased verbalization and arousal)  Difficult to assess due to: impaired communication;tracheostomy  Orientation Level: Oriented to person;Oriented to place  PHYSICAL EXAM  Blood pressure 120/98, pulse 86, temperature 98.4 F (36.9 C), temperature source Oral, resp. rate 22, height 6' (1.829 m), weight 90 kg (198 lb 6.6 oz), SpO2 100.00%.  Patient sitting comfortably in his chair with #6 Trach in Pl. cuffed. No visible secretions of my evaluation. Ears and throat notable for trach as well as thrush over the tongue. Patient has weak cough with trach in place. We did not occluded this evaluation. He is fair oral motor control. No gross cranial nerve abnormalities I can see on  exam today. Patient was able to follow simple one-step commands. He communicated with simple yes no and shakes. He is nonverbal. He had diminished fine motor movement in all 4 limbs. He may have had some underlying ataxia on the right posterior difficult to discern given his extensive weakness throughout. Strength was grossly 2+ to 3/5 proximally in both upper limbs. Lower extremity grossly 1-2/5 proximal to 2/5 distally. Patient did have sensation to gross pain stimulation. Reflexes are 1+ grossly throughout. Patient had poor insight and and awareness. Memory is difficult to assess given his language issues although it appeared to be impaired.  Heart was irregularly irregular. Chest was grossly clear with a few scattered rhonchi. Abdomen soft nontender. PEG site was clean and intact with minimal drainage. Skin throughout was generally intact. Mild edema in all 4 limbs at one plus, nonpitting. All 4 limbs were warm with 2+ pulses.  Results for orders placed during the hospital encounter of 11/19/10 (from the past 48 hour(s))   GLUCOSE, CAPILLARY Status: Abnormal    Collection Time    12/20/10 4:29 PM   Component  Value  Range  Comment    Glucose-Capillary  127 (*)  70 - 99 (mg/dL)     Comment 1  Documented in Chart      Comment 2  Notify RN     GLUCOSE, CAPILLARY Status: Abnormal    Collection Time    12/20/10 8:22 PM   Component  Value  Range  Comment    Glucose-Capillary  157 (*)  70 - 99 (mg/dL)    GLUCOSE, CAPILLARY Status: Abnormal    Collection Time    12/21/10  12:01 AM   Component  Value  Range  Comment    Glucose-Capillary  114 (*)  70 - 99 (mg/dL)    GLUCOSE, CAPILLARY Status: Abnormal    Collection Time    12/21/10 4:24 AM   Component  Value  Range  Comment    Glucose-Capillary  173 (*)  70 - 99 (mg/dL)    BASIC METABOLIC PANEL Status: Abnormal    Collection Time    12/21/10 4:41 AM   Component  Value  Range  Comment    Sodium  148 (*)  135 - 145 (mEq/L)     Potassium  3.0 (*)  3.5  - 5.1 (mEq/L)     Chloride  105  96 - 112 (mEq/L)     CO2  35 (*)  19 - 32 (mEq/L)     Glucose, Bld  196 (*)  70 - 99 (mg/dL)     BUN  15  6 - 23 (mg/dL)     Creatinine, Ser  9.60  0.50 - 1.35 (mg/dL)     Calcium  9.5  8.4 - 10.5 (mg/dL)     GFR calc non Af Amer  >90  >90 (mL/min)     GFR calc Af Amer  >90  >90 (mL/min)    GLUCOSE, CAPILLARY Status: Abnormal    Collection Time    12/21/10 7:59 AM   Component  Value  Range  Comment    Glucose-Capillary  191 (*)  70 - 99 (mg/dL)     Comment 1  Documented in Chart      Comment 2  Notify RN     GLUCOSE, CAPILLARY Status: Abnormal    Collection Time    12/21/10 12:06 PM   Component  Value  Range  Comment    Glucose-Capillary  121 (*)  70 - 99 (mg/dL)     Comment 1  Documented in Chart      Comment 2  Notify RN     GLUCOSE, CAPILLARY Status: Abnormal    Collection Time    12/21/10 5:07 PM   Component  Value  Range  Comment    Glucose-Capillary  135 (*)  70 - 99 (mg/dL)     Comment 1  Documented in Chart      Comment 2  Notify RN     GLUCOSE, CAPILLARY Status: Abnormal    Collection Time    12/21/10 8:18 PM   Component  Value  Range  Comment    Glucose-Capillary  120 (*)  70 - 99 (mg/dL)    GLUCOSE, CAPILLARY Status: Abnormal    Collection Time    12/21/10 11:50 PM   Component  Value  Range  Comment    Glucose-Capillary  149 (*)  70 - 99 (mg/dL)    GLUCOSE, CAPILLARY Status: Abnormal    Collection Time    12/22/10 4:23 AM   Component  Value  Range  Comment    Glucose-Capillary  140 (*)  70 - 99 (mg/dL)    BASIC METABOLIC PANEL Status: Abnormal    Collection Time    12/22/10 5:00 AM   Component  Value  Range  Comment    Sodium  146 (*)  135 - 145 (mEq/L)     Potassium  2.8 (*)  3.5 - 5.1 (mEq/L)     Chloride  105  96 - 112 (mEq/L)     CO2  32  19 - 32 (mEq/L)     Glucose, Bld  107 (*)  70 - 99 (mg/dL)     BUN  17  6 - 23 (mg/dL)     Creatinine, Ser  1.61  0.50 - 1.35 (mg/dL)     Calcium  9.4  8.4 - 10.5 (mg/dL)     GFR  calc non Af Amer  >90  >90 (mL/min)     GFR calc Af Amer  >90  >90 (mL/min)    MAGNESIUM Status: Normal    Collection Time    12/22/10 5:00 AM   Component  Value  Range  Comment    Magnesium  2.1  1.5 - 2.5 (mg/dL)    CBC Status: Abnormal    Collection Time    12/22/10 5:00 AM   Component  Value  Range  Comment    WBC  6.1  4.0 - 10.5 (K/uL)     RBC  2.96 (*)  4.22 - 5.81 (MIL/uL)     Hemoglobin  8.4 (*)  13.0 - 17.0 (g/dL)     HCT  09.6 (*)  04.5 - 52.0 (%)     MCV  89.5  78.0 - 100.0 (fL)     MCH  28.4  26.0 - 34.0 (pg)     MCHC  31.7  30.0 - 36.0 (g/dL)     RDW  40.9  81.1 - 15.5 (%)     Platelets  301  150 - 400 (K/uL)    GLUCOSE, CAPILLARY Status: Abnormal    Collection Time    12/22/10 8:08 AM   Component  Value  Range  Comment    Glucose-Capillary  118 (*)  70 - 99 (mg/dL)    GLUCOSE, CAPILLARY Status: Abnormal    Collection Time    12/22/10 11:32 AM   Component  Value  Range  Comment    Glucose-Capillary  132 (*)  70 - 99 (mg/dL)     Comment 1  Notify RN      No results found.  Post Admission Physician Evaluation:  1. Functional deficits secondary to multiple right hemispheric infarcts due to atrial fibrillation. Patient with hemorrhagic conversion. 2. Patient admitted to receive collaborative, interdisciplinary care between the physiatrist, rehab nursing staff, and therapy team. 3. Patient's level of medical complexity and substantial therapy needs in context of that medical necessity cannot be provided at a lesser intensity of care. 4. Patient has experienced substantial functional loss from his/her baseline. Please see above functional assessment of her premorbid and current functional levels. Judging by the patient's diagnosis, physical exam, and functional history, the patient has potential for functional progress which will result in measurable gains while on inpatient rehab. These gains will be of substantial and practical use upon discharge in facilitating  mobility and self-care at the household level. 5. Physiatrist will provide 24 hour management of medical needs as well as oversight of the therapy plan/treatment and provide guidance as appropriate regarding the interaction of the two. 6. 24 hour rehab nursing will assist in the management of bladder management, bowel management, safety, skin/wound care, medication administration, pain management and patient education and help integrate therapy concepts, techniques,education, etc. 7. PT will assess and treat for: balance, endurance, locomotion, psychosocial adjustment, strength and transferring. Goals are: minimal assist to moderate assist 8. OT will assess and treat for: bathing, bowel/bladder control, endurance, grooming, toileting and transferring . Goals are: minimal assist to moderate assist and 9. SLP will assess and treat for: cognition, speech and swallowing. Goals are: minimal assist to moderate assistance  10. Case Management and Social Worker will assess and treat for psychological issues and discharge planning. 11. Team conference will be held weekly to assess progress toward goals and to determine barriers to discharge. 12. Patient will receive at least 3 hours of therapy per day at least 5 days per week. 13. ELOS and Prognosis: 3-4 weeks good Medical Problem List and Plan:  1. DVT Prophylaxis/Anticoagulation: Mechanical: Antiembolism stockings, knee (TED hose) Bilateral lower extremities. SCDs. No anticoagulation due to bleeding complications  2. Pain Management: Tylenol. Limit sedating medications as possible given his clinical appearance  3. Mood: Team to provide ego support. Depression screening is appropriate.  Patient Active Hospital Problem List: 4. Ischemic stroke with recurrent bleed: No antiplatelet or anticoagulation.  5. Atrial fibrillation: Monitor with BID checks. Heart rate better controlled on current regimen of coreg and cardizem  6. HTN: Monitor with check q shift.  Blood pressures still labile.  7. DM type 2 : CBG checks AC and HS. Continue lantus insulin with novolog SSI for elevated BS. Anticipate that BS will run high as long as tube feeds on board.  8. ABLA: Add iron supplement. Will check anemia panel.  9. Hypokalemia continue: Has been 3.0 but with drop to 2.8 today. ?etiology ?dilutional. Need to increase supplement.  10. VDRF: Downsized to #6 trach today. Monitor tolerance and for evidence of recurrent bleeding. Remains NPO and to start bolus TF.  11. FEN: Potassium supplementation for hypokalemia. Add probiotic to improve stool consistency.

## 2010-12-23 NOTE — Progress Notes (Signed)
Picc line 3 ports no blood return,RN ,MD aware,does not wants ports to be TPA'd  the patient have  bleeding problem per MD. Audrie Gallus RN.

## 2010-12-24 ENCOUNTER — Inpatient Hospital Stay (HOSPITAL_COMMUNITY): Payer: 59

## 2010-12-24 LAB — BASIC METABOLIC PANEL
BUN: 15 mg/dL (ref 6–23)
Chloride: 103 mEq/L (ref 96–112)
GFR calc Af Amer: 90 mL/min — ABNORMAL LOW (ref >90)
GFR calc non Af Amer: 78 mL/min — ABNORMAL LOW (ref >90)
Potassium: 3.8 mEq/L (ref 3.5–5.1)
Sodium: 144 mEq/L (ref 135–145)

## 2010-12-24 LAB — GLUCOSE, CAPILLARY
Glucose-Capillary: 62 mg/dL — ABNORMAL LOW (ref 70–99)
Glucose-Capillary: 85 mg/dL (ref 70–99)
Glucose-Capillary: 181 mg/dL — ABNORMAL HIGH (ref 70–99)
Glucose-Capillary: 175 mg/dL — ABNORMAL HIGH (ref 70–99)

## 2010-12-24 MED ORDER — INSULIN ASPART 100 UNIT/ML ~~LOC~~ SOLN
0.0000 [IU] | Freq: Three times a day (TID) | SUBCUTANEOUS | Status: DC
  Administered 2010-12-24 – 2010-12-25 (×2): 3 [IU] via SUBCUTANEOUS
  Administered 2010-12-25: 2 [IU] via SUBCUTANEOUS
  Administered 2010-12-26 (×2): 3 [IU] via SUBCUTANEOUS
  Administered 2010-12-27: 2 [IU] via SUBCUTANEOUS
  Administered 2010-12-27: 3 [IU] via SUBCUTANEOUS
  Administered 2010-12-27: 5 [IU] via SUBCUTANEOUS
  Administered 2010-12-28: 2 [IU] via SUBCUTANEOUS
  Administered 2010-12-28 (×2): 3 [IU] via SUBCUTANEOUS
  Administered 2010-12-29: 2 [IU] via SUBCUTANEOUS
  Administered 2010-12-29: 3 [IU] via SUBCUTANEOUS
  Administered 2010-12-30: 2 [IU] via SUBCUTANEOUS
  Administered 2010-12-30: 3 [IU] via SUBCUTANEOUS
  Administered 2010-12-30: 2 [IU] via SUBCUTANEOUS
  Administered 2010-12-31 (×3): 3 [IU] via SUBCUTANEOUS
  Administered 2011-01-01 (×3): 2 [IU] via SUBCUTANEOUS
  Administered 2011-01-02 – 2011-01-03 (×2): 3 [IU] via SUBCUTANEOUS
  Administered 2011-01-03: 2 [IU] via SUBCUTANEOUS
  Administered 2011-01-03: 3 [IU] via SUBCUTANEOUS
  Administered 2011-01-04 (×2): 2 [IU] via SUBCUTANEOUS
  Administered 2011-01-04 – 2011-01-05 (×2): 3 [IU] via SUBCUTANEOUS
  Administered 2011-01-05 – 2011-01-06 (×3): 2 [IU] via SUBCUTANEOUS
  Administered 2011-01-06 – 2011-01-07 (×3): 3 [IU] via SUBCUTANEOUS
  Administered 2011-01-07 – 2011-01-09 (×6): 2 [IU] via SUBCUTANEOUS
  Administered 2011-01-09: 3 [IU] via SUBCUTANEOUS
  Administered 2011-01-09: 2 [IU] via SUBCUTANEOUS
  Administered 2011-01-10: 3 [IU] via SUBCUTANEOUS
  Administered 2011-01-11 – 2011-01-12 (×5): 2 [IU] via SUBCUTANEOUS
  Administered 2011-01-13 (×2): 3 [IU] via SUBCUTANEOUS
  Administered 2011-01-13 – 2011-01-17 (×6): 2 [IU] via SUBCUTANEOUS
  Administered 2011-01-19: 3 [IU] via SUBCUTANEOUS
  Filled 2010-12-24 (×9): qty 3

## 2010-12-24 MED ORDER — PANTOPRAZOLE SODIUM 40 MG PO PACK
40.0000 mg | PACK | Freq: Every day | ORAL | Status: DC
  Administered 2010-12-25 – 2010-12-31 (×7): 40 mg via ORAL
  Filled 2010-12-24 (×8): qty 20

## 2010-12-24 MED ORDER — RAMIPRIL 10 MG PO CAPS
10.0000 mg | ORAL_CAPSULE | Freq: Two times a day (BID) | ORAL | Status: DC
  Administered 2010-12-24 – 2011-01-19 (×51): 10 mg via ORAL
  Filled 2010-12-24 (×56): qty 1

## 2010-12-24 MED ORDER — CARVEDILOL 25 MG PO TABS
25.0000 mg | ORAL_TABLET | Freq: Two times a day (BID) | ORAL | Status: DC
  Administered 2010-12-25 – 2011-01-19 (×50): 25 mg via ORAL
  Filled 2010-12-24 (×54): qty 1

## 2010-12-24 MED ORDER — INSULIN ASPART 100 UNIT/ML ~~LOC~~ SOLN
0.0000 [IU] | Freq: Every day | SUBCUTANEOUS | Status: DC
  Administered 2010-12-27 (×2): 2 [IU] via SUBCUTANEOUS
  Administered 2010-12-28 (×2): 3 [IU] via SUBCUTANEOUS
  Administered 2011-01-01 – 2011-01-17 (×4): 2 [IU] via SUBCUTANEOUS
  Filled 2010-12-24 (×3): qty 3

## 2010-12-24 MED ORDER — DILTIAZEM 12 MG/ML ORAL SUSPENSION
90.0000 mg | Freq: Four times a day (QID) | ORAL | Status: DC
  Administered 2010-12-24 – 2011-01-01 (×31): 90 mg via ORAL
  Filled 2010-12-24 (×35): qty 9

## 2010-12-24 MED ORDER — STARCH (THICKENING) PO POWD
ORAL | Status: DC | PRN
  Filled 2010-12-24: qty 227

## 2010-12-24 MED ORDER — JEVITY 1.2 CAL PO LIQD
237.0000 mL | Freq: Three times a day (TID) | ORAL | Status: DC
  Administered 2010-12-25: 237 mL
  Administered 2010-12-25: 320 mL
  Administered 2010-12-27 – 2011-01-07 (×5): 237 mL
  Filled 2010-12-24 (×58): qty 237

## 2010-12-24 MED ORDER — INSULIN GLARGINE 100 UNIT/ML ~~LOC~~ SOLN
15.0000 [IU] | Freq: Two times a day (BID) | SUBCUTANEOUS | Status: DC
  Administered 2010-12-24 – 2011-01-19 (×53): 15 [IU] via SUBCUTANEOUS
  Filled 2010-12-24 (×3): qty 3

## 2010-12-24 MED ORDER — JEVITY 1.2 CAL PO LIQD
1000.0000 mL | Freq: Three times a day (TID) | ORAL | Status: DC
  Administered 2010-12-24: 1000 mL
  Filled 2010-12-24 (×5): qty 1000

## 2010-12-24 NOTE — Progress Notes (Signed)
Occupational Therapy Note  Patient Details  Name: Barry SCULLEY MRN: [id number] Date of Birth: 08/20/1955 Today's Date: 12/24/2010  [id number] Individual Treatment Pain:  None indicated Skilled clinical intervention:  Patient seen for dressing task at edge of bed.  Brother Molly Maduro present for OT session.  Patient's HR elevated with every positon change.  HR range from 87-212.  RN and MD aware.  Probe changed by RN to ensure more accurate reading.  Patient with right, posterior bias in sitting.  Patient able to follow directional and verbal cues to adjust position with increased time.  Initially patient unable to tolerate upright position.  Completed dressing from supine position.  Transfer to wheelchair using squat and stand pivot.      [name] 12/24/2010, 11:11 AM

## 2010-12-24 NOTE — Progress Notes (Signed)
Patient information reviewed and entered into [employer] system by Qualyn Oyervides, RN, CRRN, PPS Coordinator.  Information including medical coding and functional independence measure will be reviewed and updated through discharge.    

## 2010-12-24 NOTE — Procedures (Signed)
Speech Language Pathology                Modified Barium Swallow Study  Clinical Impression Statement: Pt presents with a moderate sensory-motor based oropharyngeal dysphagia. Pt with a delay in swallow initiation to the pyriform sinuses resulting in aspiration of nectar thick and thin liquds. With use of chin tuck swallow was delayed to vallecula allowing for protection of the airway with nectar thick liquids. Chin tuck unsuccessful with thin liquids. Pt observed to have moderate vallecular residuals secondary to base of tongue weakness that are significantly reduced with use of chin tuck. At this time, pt judged appropriate to consume a dysphagia 2 (chopped) diet secondary to pt's generalized weakness and concerns of fatigue, and nectar thick liquids with strict use of chin tuck. Pt must have PMSV on during consumption of all POs.   Recommend:  1. Dysphagia 2 (Chopped), nectar thick liquids 2. Chin Tuck 3. PMSV on during consumption of all POs 4. Meds whole in puree 5. Aspiration Precautions  Goals: 1. Pt will consume dysphagia 2 (chopped) and nectar thick liquids showing no overt s/s of aspiration with the use of a chin tuck and other compensatory strategies with min assist.   Chyrel Masson, Speech Pathology Student 12/24/2010

## 2010-12-24 NOTE — Progress Notes (Addendum)
Occupational Therapy Session Note  Patient Details  Name: [name] MRN: [id number] Date of Birth: 10-26-1955  Today's Date: 12/24/2010 Time: 1300- 1330 Time Calculation (min): 30 min  Precautions: Precautions Precautions: Fall Precaution Comments: trach on continuous O2, monitor HR and O2 saturation during mobility, significant extensor thrust Required Braces or Orthoses: No Restrictions Weight Bearing Restrictions: No Other Position/Activity Restrictions: monitor SpO2 and HR during therapeutic activities  Short Term Goals: OT Short Term Goal 1: Pt will complete bathing with mod assist in seated position OT Short Term Goal 2: Pt will complete UB dressing with min assist  OT Short Term Goal 3: Pt will complete LB dressing with max assist in sit to stand position OT Short Term Goal 4: Pt will demonstrate improved unsupported sitting balance for 10 mins to complete UB bathing and dressing  Skilled Therapeutic Interventions/Progress Updates:    Engaged in neuro re-ed with focus on RUE and improved scanning.  Pt engaged in card game with focus on visual scanning to Rt side to obtain correct card, use of RUE with control to obtain card.  Pt with increased ataxia noted as session progressed, encouraged pt to watch RUE when reaching out - improved coordination and appropriate grading of movement with visual input.  Pain Pain Assessment Pain Assessment: 0-10 Pain Score: 0-No pain  Therapy/Group: Individual Therapy  Leonette Monarch 12/24/2010, 1:35 PM

## 2010-12-24 NOTE — Progress Notes (Signed)
Patient ID: [name], male   DOB: [birth year], 55 y.o.   MRN: [id number] Subjective/Complaints: Answers Y/N appropriate.  [place] simple word level answers.  Oriented to person and place and situation.  Pt's brother visiting asking appropriate questions. Review of Systems  HENT: Positive for congestion.        Trach #6 cuffed  Gastrointestinal: Positive for nausea.       PEG  Genitourinary: Positive for frequency.       Foley  All other systems reviewed and are negative.    Objective: Vital Signs: Blood pressure 168/97, pulse 85, temperature 98.4 F (36.9 C), temperature source Oral, resp. rate 18, height 6' (1.829 m), weight 92.8 kg (204 lb 9.4 oz), SpO2 100.00%. Dg Abd Portable 1v  12/23/2010  *RADIOLOGY REPORT*  Clinical Data: Abdominal distention.  Question ileus.  ABDOMEN - 1 VIEW  Comparison: 11/22/2010  Findings: 1740 hours.  Supine abdomen shows no gaseous small bowel dilatation to suggest small bowel obstruction.  Gastrostomy tube overlies the upper mid abdomen.  Air and stool is seen scattered along the length of the colon.  IMPRESSION: No evidence for bowel obstruction.  No diffuse gaseous small bowel distention to suggest advanced ileus.  Original Report Authenticated By: ERIC A. MANSELL, M.D.   Results for orders placed during the hospital encounter of 12/22/10 (from the past 72 hour(s))  GLUCOSE, CAPILLARY     Status: Abnormal   Collection Time   12/22/10  5:09 PM      Component Value Range Comment   Glucose-Capillary 62 (*) 70 - 99 (mg/dL)   GLUCOSE, CAPILLARY     Status: Abnormal   Collection Time   12/22/10  5:52 PM      Component Value Range Comment   Glucose-Capillary 119 (*) 70 - 99 (mg/dL)   GLUCOSE, CAPILLARY     Status: Normal   Collection Time   12/22/10  8:30 PM      Component Value Range Comment   Glucose-Capillary 79  70 - 99 (mg/dL)   URINALYSIS, ROUTINE W REFLEX MICROSCOPIC     Status: Abnormal   Collection Time   12/22/10  8:44 PM   Component Value Range Comment   Color, Urine YELLOW  YELLOW     APPearance CLEAR  CLEAR     Specific Gravity, Urine 1.018  1.005 - 1.030     pH 6.0  5.0 - 8.0     Glucose, UA NEGATIVE  NEGATIVE (mg/dL)    Hgb urine dipstick NEGATIVE  NEGATIVE     Bilirubin Urine NEGATIVE  NEGATIVE     Ketones, ur NEGATIVE  NEGATIVE (mg/dL)    Protein, ur 30 (*) NEGATIVE (mg/dL)    Urobilinogen, UA 1.0  0.0 - 1.0 (mg/dL)    Nitrite NEGATIVE  NEGATIVE     Leukocytes, UA NEGATIVE  NEGATIVE    URINE CULTURE     Status: Normal (Preliminary result)   Collection Time   12/22/10  8:44 PM      Component Value Range Comment   Specimen Description URINE, CLEAN CATCH      Special Requests NONE      Setup Time [id number]      Colony Count PENDING      Culture Culture reincubated for better growth      Report Status PENDING     URINE MICROSCOPIC-ADD ON     Status: Abnormal   Collection Time   12/22/10  8:44 PM      Component Value Range  Comment   WBC, UA 0-2  <3 (WBC/hpf)    RBC / HPF 0-2  <3 (RBC/hpf)    Bacteria, UA RARE  RARE     Crystals CA OXALATE CRYSTALS (*) NEGATIVE     Urine-Other AMORPHOUS URATES/PHOSPHATES     GLUCOSE, CAPILLARY     Status: Normal   Collection Time   12/22/10 11:57 PM      Component Value Range Comment   Glucose-Capillary 80  70 - 99 (mg/dL)   GLUCOSE, CAPILLARY     Status: Normal   Collection Time   12/23/10  4:13 AM      Component Value Range Comment   Glucose-Capillary 98  70 - 99 (mg/dL)    Comment 1 Notify RN     CBC     Status: Abnormal   Collection Time   12/23/10  4:20 AM      Component Value Range Comment   WBC 8.2  4.0 - 10.5 (K/uL)    RBC 2.95 (*) 4.22 - 5.81 (MIL/uL)    Hemoglobin 8.2 (*) 13.0 - 17.0 (g/dL)    HCT 16.1 (*) 09.6 - 52.0 (%)    MCV 89.5  78.0 - 100.0 (fL)    MCH 27.8  26.0 - 34.0 (pg)    MCHC 31.1  30.0 - 36.0 (g/dL)    RDW 04.5  40.9 - 81.1 (%)    Platelets 325  150 - 400 (K/uL)   COMPREHENSIVE METABOLIC PANEL     Status: Abnormal    Collection Time   12/23/10  4:20 AM      Component Value Range Comment   Sodium 146 (*) 135 - 145 (mEq/L)    Potassium 2.9 (*) 3.5 - 5.1 (mEq/L)    Chloride 105  96 - 112 (mEq/L)    CO2 33 (*) 19 - 32 (mEq/L)    Glucose, Bld 119 (*) 70 - 99 (mg/dL)    BUN 16  6 - 23 (mg/dL)    Creatinine, Ser 9.14  0.50 - 1.35 (mg/dL)    Calcium 9.3  8.4 - 10.5 (mg/dL)    Total Protein 7.1  6.0 - 8.3 (g/dL)    Albumin 2.4 (*) 3.5 - 5.2 (g/dL)    AST 20  0 - 37 (U/L)    ALT 23  0 - 53 (U/L)    Alkaline Phosphatase 92  39 - 117 (U/L)    Total Bilirubin 0.2 (*) 0.3 - 1.2 (mg/dL)    GFR calc non Af Amer 90 (*) >90 (mL/min)    GFR calc Af Amer >90  >90 (mL/min)   DIFFERENTIAL     Status: Normal   Collection Time   12/23/10  4:20 AM      Component Value Range Comment   Neutrophils Relative 56  43 - 77 (%)    Neutro Abs 4.6  1.7 - 7.7 (K/uL)    Lymphocytes Relative 33  12 - 46 (%)    Lymphs Abs 2.7  0.7 - 4.0 (K/uL)    Monocytes Relative 8  3 - 12 (%)    Monocytes Absolute 0.7  0.1 - 1.0 (K/uL)    Eosinophils Relative 3  0 - 5 (%)    Eosinophils Absolute 0.2  0.0 - 0.7 (K/uL)    Basophils Relative 0  0 - 1 (%)    Basophils Absolute 0.0  0.0 - 0.1 (K/uL)   GLUCOSE, CAPILLARY     Status: Abnormal   Collection Time   12/23/10  7:31 AM      Component Value Range Comment   Glucose-Capillary 110 (*) 70 - 99 (mg/dL)    Comment 1 Notify RN     CLOSTRIDIUM DIFFICILE BY PCR     Status: Normal   Collection Time   12/23/10  9:30 AM      Component Value Range Comment   C difficile by pcr NEGATIVE  NEGATIVE    GLUCOSE, CAPILLARY     Status: Abnormal   Collection Time   12/23/10 11:55 AM      Component Value Range Comment   Glucose-Capillary 127 (*) 70 - 99 (mg/dL)    Comment 1 Notify RN     GLUCOSE, CAPILLARY     Status: Abnormal   Collection Time   12/23/10  4:07 PM      Component Value Range Comment   Glucose-Capillary 131 (*) 70 - 99 (mg/dL)    Comment 1 Notify RN     BASIC METABOLIC PANEL      Status: Abnormal   Collection Time   12/23/10  5:09 PM      Component Value Range Comment   Sodium 145  135 - 145 (mEq/L)    Potassium 3.3 (*) 3.5 - 5.1 (mEq/L)    Chloride 104  96 - 112 (mEq/L)    CO2 30  19 - 32 (mEq/L)    Glucose, Bld 117 (*) 70 - 99 (mg/dL)    BUN 15  6 - 23 (mg/dL)    Creatinine, Ser 1.61  0.50 - 1.35 (mg/dL)    Calcium 9.4  8.4 - 10.5 (mg/dL)    GFR calc non Af Amer 81 (*) >90 (mL/min)    GFR calc Af Amer >90  >90 (mL/min)   GLUCOSE, CAPILLARY     Status: Abnormal   Collection Time   12/23/10  7:43 PM      Component Value Range Comment   Glucose-Capillary 130 (*) 70 - 99 (mg/dL)   GLUCOSE, CAPILLARY     Status: Abnormal   Collection Time   12/23/10 11:54 PM      Component Value Range Comment   Glucose-Capillary 156 (*) 70 - 99 (mg/dL)   GLUCOSE, CAPILLARY     Status: Abnormal   Collection Time   12/24/10  4:07 AM      Component Value Range Comment   Glucose-Capillary 62 (*) 70 - 99 (mg/dL)   GLUCOSE, CAPILLARY     Status: Normal   Collection Time   12/24/10  4:45 AM      Component Value Range Comment   Glucose-Capillary 85  70 - 99 (mg/dL)   GLUCOSE, CAPILLARY     Status: Abnormal   Collection Time   12/24/10  7:17 AM      Component Value Range Comment   Glucose-Capillary 135 (*) 70 - 99 (mg/dL)    Comment 1 Notify RN         Blood pressure 120/98, pulse 86, temperature 98.4 F (36.9 C), temperature source Oral, resp. rate 22, height 6' (1.829 m), weight 90 kg (198 lb 6.6 oz), SpO2 100.00%.  Patient sitting comfortably in his chair with #6 Trach in Pl. cuffed. No visible secretions of my evaluation. Ears and throat notable for trach as well as thrush over the tongue. Patient has weak cough with trach in place. We did not occluded this evaluation. He is fair oral motor control. No gross cranial nerve abnormalities I can see on exam today. Patient was able to follow simple  one-step commands. He communicated with simple yes no and shakes. He is  nonverbal. He had diminished fine motor movement in all 4 limbs. He may have had some underlying ataxia on the right posterior difficult to discern given his extensive weakness throughout. Strength was grossly 2+ to 3/5 proximally in both upper limbs. Lower extremity grossly 1-2/5 proximal to 2/5 distally. Patient did have sensation to gross pain stimulation. Reflexes are 1+ grossly throughout. Patient had poor insight and and awareness. Memory is difficult to assess given his language issues although it appeared to be impaired.  Heart was irregularly irregular. Chest was grossly clear with a few scattered rhonchi. Abdomen soft nontender. PEG site was clean and intact with minimal drainage. Skin throughout was generally intact. Mild edema in all 4 limbs at one plus, nonpitting. All 4 limbs were warm with 2+ pulses.    Assessment/Plan: 1. Functional deficits secondary to R cerebellar ICH severe R UE and R LE ataxia which require 3+ hours per day of interdisciplinary therapy in a comprehensive inpatient rehab setting.  Team conf today Physiatrist is providing close team supervision and 24 hour management of active medical problems listed below. Physiatrist and rehab team continue to assess barriers to discharge/monitor patient progress toward functional and medical goals. Mobility: Bed Mobility Bed Mobility: Yes Rolling Right: 2: Max assist;With rail Rolling Left: 3: Mod assist Right Sidelying to Sit: With rails;2: Max assist Sitting - Scoot to Edge of Bed: 1: +1 Total assist;Other (comment) (significant posterior right lean) Transfers Sit to Stand: 1: +1 Total assist Sit to Stand Details (indicate cue type and reason): significant posterior right lean, slow to initiate movement Stand to Sit: 1: +1 Total assist Stand Pivot Transfer Details (indicate cue type and reason): poor LE eccentric control with uncontrolled descent, significant posterior right lean Squat Pivot Transfers: 1: +2 Total  assist Squat Pivot Transfer Details (indicate cue type and reason): total assist + 2 (pt = 50%), poor graded movement and weight shift, slow to initiate with LEs Ambulation/Gait Stairs: No (pre-gait level at this time, unsafe to attempt) Wheelchair Mobility Wheelchair Mobility: No  ADL:    Cognition: Cognition Arousal/Alertness: Lethargic Orientation Level: Oriented to person Cognition Arousal/Alertness: Lethargic Orientation Level: Oriented to person  2. Anticoagulation/DVT prophylaxis with Pharmaceutical:no anticoag use SCDs 3. Pain Management:monitor Patient Active Hospital Problem List: 4.  Afib cannot use warfarin due to ICH Diabetes mellitus (11/19/2010)    POA: Yes    Plan:reduce Lantus monitor CBG Respiratory failure (11/19/2010)    POA: Yes   Assessment: trach wean as able    Gout (11/20/2010)    POA: Yes   Assessment: inactive   Plan: monitor ICH (intracerebral hemorrhage) (12/22/2010)    POA: No   Assessment: R hemiataxia non progressive   Plan: no anticoag   2  KIRSTEINS,ANDREW E 12/24/2010, 7:57 AM

## 2010-12-24 NOTE — Patient Care Conference (Signed)
Inpatient RehabilitationTeam Conference Note Date: 12/25/2010   Time: 2:40 PM    Patient Name: [name]      Medical Record Number: [id number]  Date of Birth: 1955-07-20 Sex: Male         Room/Bed: 4030/4030-01 Payor Info: Payor: Advertising copywriter  Plan: [employer]  Product Type: *No Product type*     Admitting Diagnosis: L CEREBELLAR CVA  Admit Date/Time:  12/22/2010  4:53 PM Admission Comments: No comment available   Primary Diagnosis:  <principal problem not specified> Principal Problem: <principal problem not specified>  Patient Active Problem List  Diagnoses Date Noted  . ICH (intracerebral hemorrhage) 12/22/2010  . Hypernatremia 12/22/2010  . Physical deconditioning 12/22/2010  . Hyperlipemia 11/21/2010  . Gout 11/20/2010  . CAD (coronary artery disease) 11/20/2010  . Stroke 11/19/2010  . Diabetes mellitus 11/19/2010  . Respiratory failure 11/19/2010  . A-fib 11/19/2010    Expected Discharge Date:  01/19/11  Team Members Present: MDPresent: Dr. Wynn Banker PT Present: Edman Circle OT Present: Bretta Bang, Leonette Monarch ST Present: Fae Pippin Case Manager Present: Lutricia Horsfall, RN;Melanee Spry, RN Social Worker Present: Dossie Der, LCSW RN Present: Guido Sander    Current Status/Progress Goal Weekly Team Focus  Medical   Severe dysarthria, decreased pulmonary toilet, severe dysphagia, s/p trach and PEG, low CBG  Prevent aspiration, advance to po feeds as tolerated, maintain normogycemia  Build endurance for therapy   Bowel/Bladder   Pt incontinent of bowel and bladder. Condom cath in place 24/7.  Continent of bowel and bladder with min assist      Swallow/Nutrition/ Hydration   MBSS today  least restrictive p.o. intake      ADL's   mod assist bathing, mod assist UB dsg, total assist LB dsg, min assist sitting balance,  min assist overall, supervision grooming and UB dsg  activity tolerance, transfers   Mobility   total assist  min assist  overall  OOB tolerance, decreased assist transfers, spacing therapies out   Communication   PMSV during all therapies with constant montoring  supervision-minimal assist  recommend change trach to #6 cuff less; continue trials potientailly upgrade to PMSV all waking hours   Safety/Cognition/ Behavioral Observations  Cognitive-Linguistic evaluation pending         Pain   Denies pain  pt pain level to be kept at < goal of 3/10  Staff to assess for pain every 4 hours.   Skin   No skin problems  No skin infection  Staff to maintain skin integrity      *See Interdisciplinary Assessment and Plan and progress notes for long and short-term goals  Barriers to Discharge: Total A care needs, advanced care needed to manage trach and PEG    Possible Resolutions to Barriers:  Initiate PT,OT,SLP protocols, educate family toward end of stay    Discharge Planning/Teaching Needs: To d/c to siblings at d/c.        Team Discussion: Changing to cuffless trach w/ PMV.  Starting to swallow-eating well.  Only flushing PEG.   Revisions to Treatment Plan: none    Continued Need for Acute Rehabilitation Level of Care: The patient requires daily medical management by a physician with specialized training in physical medicine and rehabilitation for the following conditions: Daily direction of a multidisciplinary physical rehabilitation program to ensure safe treatment while eliciting the highest outcome that is of practical value to the patient.: Yes Daily medical management of patient stability for increased activity during participation in an intensive  rehabilitation regime.: Yes Daily analysis of laboratory values and/or radiology reports with any subsequent need for medication adjustment of medical intervention for : Pulmonary problems;Post surgical problems;Neurological problems  [name] 12/24/2010, 12:27 PM

## 2010-12-24 NOTE — Progress Notes (Signed)
Physical Therapy Session Note  Patient Details  Name: [name] MRN: [id number] Date of Birth: [birth year]  Today's Date: 12/24/2010 Time: 4098-1191  16 minutes  Precautions: Precautions Precautions: Fall Precaution Comments: trach on continuous O2, monitor HR and O2 saturation during mobility, significant extensor thrust Required Braces or Orthoses: No Restrictions Weight Bearing Restrictions: No Other Position/Activity Restrictions: monitor SpO2 and HR during therapeutic activities  Short Term Goals: progressing for all PT Short Term Goal 1: Patient will perform bed mobility with mod assist. PT Short Term Goal 1 - Progress: Progressing toward goal PT Short Term Goal 2: Pt will maintain unsupported static sitting balance in midline > 5 min with supervision. PT Short Term Goal 2 - Progress: Progressing toward goal PT Short Term Goal 3: Patient will maintain dynamic sitting balance > 5 min with min assist. PT Short Term Goal 3 - Progress: Progressing toward goal PT Short Term Goal 4: Patient will tolerate OOB in chair > 60 min for increased activity tolerance. PT Short Term Goal 4 - Progress: Progressing toward goal PT Short Term Goal 5: Patient will maintain static standing balance > 1 min with mod assist. PT Short Term Goal 5 - Progress: Progressing toward goal  Skilled Therapeutic Interventions/Progress Updates:     General Chart Reviewed: Yes Vital Signs Therapy Vitals Pulse Rate: 80  Oxygen Therapy SpO2: 100 % O2 Device: Trach collar FiO2 (%): 28 % O2 Flow Rate (L/min): 5 L/min Pain Pain Assessment Pain Assessment: No/denies pain Pain Score: 0-No pain  Other Treatments  Assisted in positioning patient in reclining back W/C for improved postural alignment and support. RN needed to perform feeding, medication administration, and trach care. PT will follow up this afternoon as able. Missed 30 minutes this morning.  Therapy/Group: Individual Therapy  [name] 12/24/2010, 10:20 AM

## 2010-12-24 NOTE — Progress Notes (Signed)
Inpatient Rehabilitation Center Individual Statement of Services  Patient Name:  [name]  Date:  12/24/2010  Welcome to the Inpatient Rehabilitation Center.  Our goal is to provide you with an individualized program based on your diagnosis and situation, designed to meet your specific needs.  With this comprehensive rehabilitation program, you will be expected to participate in at least 3 hours of rehabilitation therapies Monday-Friday, with modified therapy programming on the weekends.  Your rehabilitation program will include the following services:  Physical Therapy (PT), Occupational Therapy (OT), Speech Therapy (ST), 24 hour per day rehabilitation nursing, Therapeutic Recreaction (TR), Case Management (RN and Child psychotherapist), Rehabilitation Medicine, Nutrition Services and Pharmacy Services  Weekly team conferences will be held on Wednesdays to discuss your progress.  Your RN Case Designer, television/film set will talk with you frequently to get your input and to update you on team discussions.  Team conferences with you and your family in attendance may also be held.  Expected length of stay:4 weeks Overall predicted outcome:Supervision - Minimum assistance  Depending on your progress and recovery, your program may change.  Your RN Case Estate agent will coordinate services and will keep you informed of any changes.  Your RN Sports coach and SW names and contact numbers are listed  below.  The following services may also be recommended but are not provided by the Inpatient Rehabilitation Center:   Driving Evaluations  Home Health Rehabiltiation Services  Outpatient Rehabilitatation [facility]  Vocational Rehabilitation   Arrangements will be made to provide these services after discharge if needed.  Arrangements include referral to agencies that provide these services.  Your insurance has been verified to be:  [facility] Your primary doctor is: Dr Juleen China  Pertinent  information will be shared with your doctor and your insurance company.  Case Manager: Lutricia Horsfall, [facility] [phone]  Social Worker:  Dossie Der, [place] [phone]  Information discussed with and copy given to patient by: [name], 12/24/2010 1:55 PM

## 2010-12-24 NOTE — Progress Notes (Signed)
Assessment & Plan  Speech Language Pathology Assessment and Plan  Patient Details  Name: [name] MRN: [id number] Date of Birth: [birth year]  SLP Diagnosis: communication impairment, dysarthria, cognitive deficit, dysphagia Rehab Potential: Good ELOS:  4 weeks  Time: 1330-1430 Time Calculation (min): 60 min  Assessment & Plan Clinical Impression: [name] is an 55 y.o. male with H/O COPD, DM, A fib, CAD, CHF, and gout admitted 11/19/2010 with acute onset of slurred speech and left sided weakness. CT head without acute abnormality. Patient became diaphoretic with increased WOB with inability to speak, LUE flaccidity while in Xray. He was intubated for airway support. CT angio chest without PE and evidence of CHF. CTA head and neck without thrombus, distal left vertebral artery stenosis and basilar artery stenosis. MRI brain with acute subacute infarct right SCA territory including mid brain and upper pons. 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   On 11/10 patient, had ventriculostomy placed with good drainage of CSF. Patient comatose and unresponsive. Patient trached by Dr Pollyann Kennedy on 11/16 and PEG placed 11/21 by Dr. Juanda Chance. Developed fevers and diarrhea, started on flagyl for questionable C diff. Ventilator wean initiated On, 11/24 with worsening of MS and repeat CT head with slight increase in hemorrhage and questionable new right parietal infarct and patient reintubated. Dr Phoebe Perch consulted and no surgical intervention recommended. Vent wean initiated 11/28 and patient tolerated extubation. Noted to have blood in stools with ?lower GIB. GI recommended serial hemoglobin checks. Acute stomal bleeding on 12/3 treated with change and placement of #8 cuffed trach.  Blood pressures remain labile and A Fib treated with IV Cardizem. Dr [name] consulted for input on BP/afib management and recommended adding labetalol and ace inhibitors for better control. PMSV trials ongoing and patient with difficulty tolerating trials due to #8. He was downsized to a #6 cuffed trach today.   Patient transferred to CIR on 12/22/2010 .  PMSV evaluation 12/23/10 and MBSS 12/24/10.  Cognitive-Linguistic Evaluation today revealed deficits impacting his ability to express his wants and needs as well as recall and carryover information pertaining to his self care (see below for details).  As a result patient would benefit from skilled SLP services to maximize functional independence and reduce burden of care upon discharge.   Short Term Goals: set 12/24/10 1. Patient will demonstrate sustained attention to task for 10-12 minutes with minimal assist semantic cues 2. Patient will increase speech intelligibility at phrase level expression with moderate assist semantic cues to increase vocal intensity 3. Patient will consume dysphagia 2 (chopped) and nectar thick liquids showing no overt s/s of aspiration with the use of a chin tuck and other compensatory strategies with min assist 4. Patient will demonstrate day to day carryover of self care information with minimal assist semantic cues 5. Patient will use PMSV during all waking hours with conintuous SpO2 monitoring and saturation greater than 90% starting 12/24/10  6. Patient will don and doff PMSV with moderate assist semantic and tactile cues  SLP - End of Session Activity Tolerance: Tolerates 30+ min activity with multiple rests Patient left: in bed;with call bell in reach;with bed alarm set Nurse Communication: Aspiration precautions reviewed;Cognitive/Linguistic strategies reviewed;Diet recommendation;Swallow strategies reviewed;Other (comment) (PMSV recommendations reviewed) Assessment Rehab Potential: Good Barriers to Discharge:  None Therapy Diagnosis: Dysarthria;Cognitive Impairments Type of Dysarthria: Flaccid  Precautions/Restrictions  Precautions Precaution Comments: PMSV on with staff, meals; Full staff supervision with meals; Dys.2 with Nectar-thick liquids General  Chart Reviewed: Yes Additional Pertinent History: Patient transferred to CIR 12/22/10; PMSV eval 12/23/10 and MBSS 12/24/10 Response to Previous Treatment: Patient with no complaints from previous session Family/Caregiver Present: No Vital Signs Therapy Vitals Temp: 98.7 F (37.1 C) Temp src: Oral Pulse Rate: 81  Resp: 22  BP: 123/89 mmHg Patient Position, if appropriate: Lying Oxygen Therapy SpO2: 97 % O2 Device: Trach collar Pain Pain Assessment Pain Assessment: No/denies pain Pain Score: 0-No pain Prior Functioning Cognitive/Linguistic Baseline: Within functional limits Type of Home: House Lives With: Alone Education: college  Vocation: Unemployed Cognition Overall Cognitive Status: Impaired Arousal/Alertness: Lethargic Orientation Level: Disoriented to time Attention: Sustained Sustained Attention: Impaired Sustained Attention Impairment: Verbal basic;Functional basic (1-2 minutes) Memory: Impaired Memory Impairment: Decreased recall of new information;Decreased long term memory (recalled 2/3 words at 5 minutes) Decreased Long Term Memory: Verbal basic (unable to recall month Thanksgiving was in) Awareness: Impaired Awareness Impairment: Emergent impairment (moderate assist ) Problem Solving: Impaired Problem Solving Impairment: Functional basic (minimal assist) Executive Function: Self Monitoring;Self Correcting Self Monitoring: Impaired Self Monitoring Impairment: Verbal basic (inconsistently aware of verbal perseverations) Self Correcting: Impaired Self Correcting Impairment: Verbal basic (no attempts) Safety/Judgment: Appears intact Comprehension Auditory Comprehension Yes/No Questions: Impaired Basic  Immediate Environment Questions: 75-100% accurate (90% accurate) Complex Questions: 50-74% accurate (70% accurate) Commands: Impaired Multistep Basic Commands: Other (comment) (2 step 100%, 3 step 0%) Conversation: Simple Interfering Components: Other (comment);Attention;Anxiety;Processing speed (trach with PMSV) EffectiveTechniques: Extra processing time;Repetition;Visual/Gestural cues Visual Recognition/Discrimination Discrimination: Exceptions to [facility] Reading Comprehension Reading Status: Impaired Interfering Components: Attention;Visual scanning;Processing time;Visual acuity;Eye glasses not available Effective Techniques: Large print;Other (comment) (simple print) Expression Expression Primary Mode of Expression: Verbal Verbal Expression Initiation: Impaired Automatic Speech: Day of week;Month of year (DOW 6/7, MOY perseverated on DOW) Level of Generative/Spontaneous Verbalization: Word Repetition: No impairment Naming: No impairment Pragmatics: Impairment Impairments: Eye contact;Monotone Interfering Components: Attention;Other (comment) (Trach with intermittent PMSV trials) Effective Techniques: Open ended questions;Semantic cues Non-Verbal Means of Communication: Other (comment) (mouthing ) Written Expression Written Expression: Not tested Oral/Motor Oral Motor/Sensory Function Labial ROM: Other (Comment) (generalized weakness) Labial Strength: Reduced Lingual ROM: Reduced right;Reduced left Lingual Strength: Reduced Motor Speech Intelligibility: Intelligibility reduced   Recommendations for other services: None  Discharge Criteria: Patient will be discharged from SLP if patient refuses treatment 3 consecutive times without medical reason, if treatment goals not met, if there is a change in medical status, if patient makes no progress towards goals or if patient is discharged from hospital.  The above assessment, treatment plan, treatment alternatives and goals were  discussed and mutually agreed upon: by patient  Charlane Ferretti., CCC-SLP [phone] Bert Ptacek 12/24/2010 4:45 PM

## 2010-12-25 LAB — GLUCOSE, CAPILLARY
Glucose-Capillary: 159 mg/dL — ABNORMAL HIGH (ref 70–99)
Glucose-Capillary: 122 mg/dL — ABNORMAL HIGH (ref 70–99)
Glucose-Capillary: 148 mg/dL — ABNORMAL HIGH (ref 70–99)

## 2010-12-25 LAB — URINE CULTURE

## 2010-12-25 MED ORDER — CHLORPROMAZINE HCL 25 MG/ML IJ SOLN
25.0000 mg | Freq: Three times a day (TID) | INTRAMUSCULAR | Status: DC | PRN
  Administered 2010-12-26: 25 mg via INTRAMUSCULAR
  Filled 2010-12-25 (×2): qty 1

## 2010-12-25 NOTE — Progress Notes (Signed)
Patient ID: [name], male   DOB: [birth year], 55 y.o.   MRN: [id number] Subjective/Complaints: Swallow eval improved pt now taking po. Review of Systems  HENT: Positive for congestion.        Trach #6 cuffed  Gastrointestinal: Positive for nausea.       PEG  Genitourinary: Positive for frequency.       Foley  All other systems reviewed and are negative.    Objective: Vital Signs: Blood pressure 162/98, pulse 94, temperature 99 F (37.2 C), temperature source Oral, resp. rate 18, height 6' (1.829 m), weight 97.2 kg (214 lb 4.6 oz), SpO2 98.00%. Dg Abd Portable 1v  12/23/2010  *RADIOLOGY REPORT*  Clinical Data: Abdominal distention.  Question ileus.  ABDOMEN - 1 VIEW  Comparison: 11/22/2010  Findings: 1740 hours.  Supine abdomen shows no gaseous small bowel dilatation to suggest small bowel obstruction.  Gastrostomy tube overlies the upper mid abdomen.  Air and stool is seen scattered along the length of the colon.  IMPRESSION: No evidence for bowel obstruction.  No diffuse gaseous small bowel distention to suggest advanced ileus.  Original Report Authenticated By: ERIC A. MANSELL, M.D.   Dg Swallowing Func-no Report  12/24/2010  CLINICAL DATA: rule out aspiration   FLUOROSCOPY FOR SWALLOWING FUNCTION STUDY:  Fluoroscopy was provided for swallowing function study, which was  administered by a speech pathologist.  Final results and recommendations  from this study are contained within the speech pathology report.     Results for orders placed during the hospital encounter of 12/22/10 (from the past 72 hour(s))  GLUCOSE, CAPILLARY     Status: Abnormal   Collection Time   12/22/10  5:09 PM      Component Value Range Comment   Glucose-Capillary 62 (*) 70 - 99 (mg/dL)   GLUCOSE, CAPILLARY     Status: Abnormal   Collection Time   12/22/10  5:52 PM      Component Value Range Comment   Glucose-Capillary 119 (*) 70 - 99 (mg/dL)   GLUCOSE, CAPILLARY     Status: Normal   Collection Time     12/22/10  8:30 PM      Component Value Range Comment   Glucose-Capillary 79  70 - 99 (mg/dL)   URINALYSIS, ROUTINE W REFLEX MICROSCOPIC     Status: Abnormal   Collection Time   12/22/10  8:44 PM      Component Value Range Comment   Color, Urine YELLOW  YELLOW     APPearance CLEAR  CLEAR     Specific Gravity, Urine 1.018  1.005 - 1.030     pH 6.0  5.0 - 8.0     Glucose, UA NEGATIVE  NEGATIVE (mg/dL)    Hgb urine dipstick NEGATIVE  NEGATIVE     Bilirubin Urine NEGATIVE  NEGATIVE     Ketones, ur NEGATIVE  NEGATIVE (mg/dL)    Protein, ur 30 (*) NEGATIVE (mg/dL)    Urobilinogen, UA 1.0  0.0 - 1.0 (mg/dL)    Nitrite NEGATIVE  NEGATIVE     Leukocytes, UA NEGATIVE  NEGATIVE    URINE CULTURE     Status: Normal   Collection Time   12/22/10  8:44 PM      Component Value Range Comment   Specimen Description URINE, CLEAN CATCH      Special Requests NONE      Setup Time [id number]      Colony Count 70,000 COLONIES/ML      Culture  Value: KLEBSIELLA OXYTOCA     ESCHERICHIA COLI   Report Status 12/25/2010 FINAL      Organism ID, Bacteria KLEBSIELLA OXYTOCA      Organism ID, Bacteria ESCHERICHIA COLI     URINE MICROSCOPIC-ADD ON     Status: Abnormal   Collection Time   12/22/10  8:44 PM      Component Value Range Comment   WBC, UA 0-2  <3 (WBC/hpf)    RBC / HPF 0-2  <3 (RBC/hpf)    Bacteria, UA RARE  RARE     Crystals CA OXALATE CRYSTALS (*) NEGATIVE     Urine-Other AMORPHOUS URATES/PHOSPHATES     GLUCOSE, CAPILLARY     Status: Normal   Collection Time   12/22/10 11:57 PM      Component Value Range Comment   Glucose-Capillary 80  70 - 99 (mg/dL)   GLUCOSE, CAPILLARY     Status: Normal   Collection Time   12/23/10  4:13 AM      Component Value Range Comment   Glucose-Capillary 98  70 - 99 (mg/dL)    Comment 1 Notify RN     CBC     Status: Abnormal   Collection Time   12/23/10  4:20 AM      Component Value Range Comment   WBC 8.2  4.0 - 10.5 (K/uL)    RBC 2.95 (*)  4.22 - 5.81 (MIL/uL)    Hemoglobin 8.2 (*) 13.0 - 17.0 (g/dL)    HCT 04.5 (*) 40.9 - 52.0 (%)    MCV 89.5  78.0 - 100.0 (fL)    MCH 27.8  26.0 - 34.0 (pg)    MCHC 31.1  30.0 - 36.0 (g/dL)    RDW 81.1  91.4 - 78.2 (%)    Platelets 325  150 - 400 (K/uL)   COMPREHENSIVE METABOLIC PANEL     Status: Abnormal   Collection Time   12/23/10  4:20 AM      Component Value Range Comment   Sodium 146 (*) 135 - 145 (mEq/L)    Potassium 2.9 (*) 3.5 - 5.1 (mEq/L)    Chloride 105  96 - 112 (mEq/L)    CO2 33 (*) 19 - 32 (mEq/L)    Glucose, Bld 119 (*) 70 - 99 (mg/dL)    BUN 16  6 - 23 (mg/dL)    Creatinine, Ser 9.56  0.50 - 1.35 (mg/dL)    Calcium 9.3  8.4 - 10.5 (mg/dL)    Total Protein 7.1  6.0 - 8.3 (g/dL)    Albumin 2.4 (*) 3.5 - 5.2 (g/dL)    AST 20  0 - 37 (U/L)    ALT 23  0 - 53 (U/L)    Alkaline Phosphatase 92  39 - 117 (U/L)    Total Bilirubin 0.2 (*) 0.3 - 1.2 (mg/dL)    GFR calc non Af Amer 90 (*) >90 (mL/min)    GFR calc Af Amer >90  >90 (mL/min)   DIFFERENTIAL     Status: Normal   Collection Time   12/23/10  4:20 AM      Component Value Range Comment   Neutrophils Relative 56  43 - 77 (%)    Neutro Abs 4.6  1.7 - 7.7 (K/uL)    Lymphocytes Relative 33  12 - 46 (%)    Lymphs Abs 2.7  0.7 - 4.0 (K/uL)    Monocytes Relative 8  3 - 12 (%)    Monocytes Absolute 0.7  0.1 - 1.0 (K/uL)    Eosinophils Relative 3  0 - 5 (%)    Eosinophils Absolute 0.2  0.0 - 0.7 (K/uL)    Basophils Relative 0  0 - 1 (%)    Basophils Absolute 0.0  0.0 - 0.1 (K/uL)   GLUCOSE, CAPILLARY     Status: Abnormal   Collection Time   12/23/10  7:31 AM      Component Value Range Comment   Glucose-Capillary 110 (*) 70 - 99 (mg/dL)    Comment 1 Notify RN     CLOSTRIDIUM DIFFICILE BY PCR     Status: Normal   Collection Time   12/23/10  9:30 AM      Component Value Range Comment   C difficile by pcr NEGATIVE  NEGATIVE    GLUCOSE, CAPILLARY     Status: Abnormal   Collection Time   12/23/10 11:55 AM       Component Value Range Comment   Glucose-Capillary 127 (*) 70 - 99 (mg/dL)    Comment 1 Notify RN     GLUCOSE, CAPILLARY     Status: Abnormal   Collection Time   12/23/10  4:07 PM      Component Value Range Comment   Glucose-Capillary 131 (*) 70 - 99 (mg/dL)    Comment 1 Notify RN     BASIC METABOLIC PANEL     Status: Abnormal   Collection Time   12/23/10  5:09 PM      Component Value Range Comment   Sodium 145  135 - 145 (mEq/L)    Potassium 3.3 (*) 3.5 - 5.1 (mEq/L)    Chloride 104  96 - 112 (mEq/L)    CO2 30  19 - 32 (mEq/L)    Glucose, Bld 117 (*) 70 - 99 (mg/dL)    BUN 15  6 - 23 (mg/dL)    Creatinine, Ser 1.61  0.50 - 1.35 (mg/dL)    Calcium 9.4  8.4 - 10.5 (mg/dL)    GFR calc non Af Amer 81 (*) >90 (mL/min)    GFR calc Af Amer >90  >90 (mL/min)   GLUCOSE, CAPILLARY     Status: Abnormal   Collection Time   12/23/10  7:43 PM      Component Value Range Comment   Glucose-Capillary 130 (*) 70 - 99 (mg/dL)   GLUCOSE, CAPILLARY     Status: Abnormal   Collection Time   12/23/10 11:54 PM      Component Value Range Comment   Glucose-Capillary 156 (*) 70 - 99 (mg/dL)   GLUCOSE, CAPILLARY     Status: Abnormal   Collection Time   12/24/10  4:07 AM      Component Value Range Comment   Glucose-Capillary 62 (*) 70 - 99 (mg/dL)   GLUCOSE, CAPILLARY     Status: Normal   Collection Time   12/24/10  4:45 AM      Component Value Range Comment   Glucose-Capillary 85  70 - 99 (mg/dL)   BASIC METABOLIC PANEL     Status: Abnormal   Collection Time   12/24/10  6:40 AM      Component Value Range Comment   Sodium 144  135 - 145 (mEq/L)    Potassium 3.8  3.5 - 5.1 (mEq/L)    Chloride 103  96 - 112 (mEq/L)    CO2 32  19 - 32 (mEq/L)    Glucose, Bld 133 (*) 70 - 99 (mg/dL)    BUN 15  6 - 23 (mg/dL)    Creatinine, Ser 4.54  0.50 - 1.35 (mg/dL)    Calcium 9.5  8.4 - 10.5 (mg/dL)    GFR calc non Af Amer 78 (*) >90 (mL/min)    GFR calc Af Amer 90 (*) >90 (mL/min)   GLUCOSE, CAPILLARY      Status: Abnormal   Collection Time   12/24/10  7:17 AM      Component Value Range Comment   Glucose-Capillary 135 (*) 70 - 99 (mg/dL)    Comment 1 Notify RN     GLUCOSE, CAPILLARY     Status: Abnormal   Collection Time   12/24/10 11:43 AM      Component Value Range Comment   Glucose-Capillary 181 (*) 70 - 99 (mg/dL)    Comment 1 Notify RN     GLUCOSE, CAPILLARY     Status: Abnormal   Collection Time   12/24/10  4:10 PM      Component Value Range Comment   Glucose-Capillary 168 (*) 70 - 99 (mg/dL)    Comment 1 Notify RN     GLUCOSE, CAPILLARY     Status: Abnormal   Collection Time   12/24/10  8:37 PM      Component Value Range Comment   Glucose-Capillary 175 (*) 70 - 99 (mg/dL)       Blood pressure 098/11, pulse 86, temperature 98.4 F (36.9 C), temperature source Oral, resp. rate 22, height 6' (1.829 m), weight 90 kg (198 lb 6.6 oz), SpO2 100.00%.  Patient sitting comfortably in his chair with #6 Trach in Pl. cuffed. No visible secretions of my evaluation. Ears and throat notable for trach as well as thrush over the tongue. Patient has weak cough with trach in place. We did not occluded this evaluation. He is fair oral motor control. No gross cranial nerve abnormalities I can see on exam today. Patient was able to follow simple one-step commands. He communicated with simple yes no and shakes. He is nonverbal. He had diminished fine motor movement in all 4 limbs. He may have had some underlying ataxia on the right posterior difficult to discern given his extensive weakness throughout. Strength was grossly 2+ to 3/5 proximally in both upper limbs. Lower extremity grossly 1-2/5 proximal to 2/5 distally. Patient did have sensation to gross pain stimulation. Reflexes are 1+ grossly throughout. Patient had poor insight and and awareness. Memory is difficult to assess given his language issues although it appeared to be impaired.  Heart was irregularly irregular. Chest was clear. Abdomen soft  nontender. PEG site was clean and intact with no drainage. Skin throughout was generally intact. Mild edema in all 4 limbs at one plus, nonpitting. All 4 limbs were warm with 2+ pulses.    Assessment/Plan: 1. Functional deficits secondary to R cerebellar ICH severe R UE and R LE ataxia which require 3+ hours per day of interdisciplinary therapy in a comprehensive inpatient rehab setting.  Team conf today Physiatrist is providing close team supervision and 24 hour management of active medical problems listed below. Physiatrist and rehab team continue to assess barriers to discharge/monitor patient progress toward functional and medical goals. Mobility: Bed Mobility Bed Mobility: Yes Rolling Right: 2: Max assist;With rail Rolling Left: 3: Mod assist Right Sidelying to Sit: With rails;2: Max assist Sitting - Scoot to Edge of Bed: 1: +1 Total assist;Other (comment) (significant posterior right lean) Transfers Sit to Stand: 1: +1 Total assist Sit to Stand Details (indicate cue type and reason):  significant posterior right lean, slow to initiate movement Stand to Sit: 1: +1 Total assist Stand Pivot Transfer Details (indicate cue type and reason): poor LE eccentric control with uncontrolled descent, significant posterior right lean Squat Pivot Transfers: 1: +2 Total assist Squat Pivot Transfer Details (indicate cue type and reason): total assist + 2 (pt = 50%), poor graded movement and weight shift, slow to initiate with LEs Ambulation/Gait Stairs: No (pre-gait level at this time, unsafe to attempt) Wheelchair Mobility Wheelchair Mobility: No  ADL:    Cognition: Cognition Overall Cognitive Status: Impaired Arousal/Alertness: Lethargic Orientation Level: Oriented to person;Other (Comment) (per report pt is only oriented to self. ) Attention: Sustained Sustained Attention: Impaired Sustained Attention Impairment: Verbal basic;Functional basic (1-2 minutes) Memory: Impaired Memory  Impairment: Decreased recall of new information;Decreased long term memory (recalled 2/3 words at 5 minutes) Decreased Long Term Memory: Verbal basic (unable to recall month Thanksgiving was in) Awareness: Impaired Awareness Impairment: Emergent impairment (moderate assist ) Problem Solving: Impaired Problem Solving Impairment: Functional basic (minimal assist) Executive Function: Self Monitoring;Self Correcting Self Monitoring: Impaired Self Monitoring Impairment: Verbal basic (inconsistently aware of verbal perseverations) Self Correcting: Impaired Self Correcting Impairment: Verbal basic (no attempts) Safety/Judgment: Appears intact Cognition Arousal/Alertness: Lethargic Orientation Level: Oriented to person;Other (Comment) (per report pt is only oriented to self. )  2. Anticoagulation/DVT prophylaxis with Pharmaceutical:no anticoag use SCDs 3. Pain Management:monitor Patient Active Hospital Problem List: 4.  Afib cannot use warfarin due to ICH Diabetes mellitus (11/19/2010)    POA: Yes    Plan:reduce Lantus monitor CBG Respiratory failure (11/19/2010)    POA: Yes   Assessment: trach wean as able reduce to #6 cuffless today    Gout (11/20/2010)    POA: Yes   Assessment: inactive   Plan: monitor ICH (intracerebral hemorrhage) (12/22/2010)    POA: No   Assessment: R hemiataxia non progressive   Plan: no anticoag   3  KIRSTEINS,ANDREW E 12/25/2010, 7:27 AM

## 2010-12-25 NOTE — Progress Notes (Signed)
Mod Assist x2 stand pivot transfer. Held [place] all day, patient eating >70% breakfast and lunch, ~ 40-50 % supper. Peg tube flushed, patent. Patient tolerating Passey muir valve. Total oral care performed. Thrush to tongue greatly improving. Bowels continue to be incontinent but less mucus. Brother at bedside through out the day, teaching began with feeding safety. Full supervision with all meals. Shelbie Hutching in place throughout the day. Patient participating with therapy. Continue plan of care.

## 2010-12-25 NOTE — Progress Notes (Signed)
Progress Notes  Speech Language Pathology Therapy Note  Patient Details  Name: [name] MRN: [id number] Date of Birth: 02-13-1955  Today's Date: 12/25/2010 Time: 4098-1191 Time Calculation (min): 45 min  Precautions: Precautions Precautions: Fall Precaution Comments: PMV with staff, posterior left lean Required Braces or Orthoses: No Restrictions Weight Bearing Restrictions: No Other Position/Activity Restrictions: monitor SpO2 and HR during therapeutic activities  Short Term Goals: set 12/24/10  1. Patient will demonstrate sustained attention to task for 10-12 minutes with minimal assist semantic cues  2. Patient will increase speech intelligibility at phrase level expression with moderate assist semantic cues to increase vocal intensity  3. Patient will consume dysphagia 2 (chopped) and nectar thick liquids showing no overt s/s of aspiration with the use of a chin tuck and other compensatory strategies with min assist  4. Patient will demonstrate day to day carryover of self care information with minimal assist semantic cues  5. Patient will use PMSV during all waking hours with conintuous SpO2 monitoring and saturation greater than 90% starting 12/24/10  6. Patient will don and doff PMSV with moderate assist semantic and tactile cues  Skilled Therapeutic Interventions/Progress Updates: Session focused on family education with brother Molly Maduro) family and patient able to don and doff PMSV with supervision semantic cues with mirror set up and demonstration followed by verbal cues, patient with moderate assist multi-modal cue due to visual deficits; Brother also observed and verbalized understanding of swallowing precautions, while patient consumed Nectar-thick liquids via cup with moderate assist semantic cues to maintain head down posture until completion of swallow with no overt s/s of aspiration.  Precautions/Restrictions  Precautions Precautions: Fall Precaution Comments: PMV  with staff, posterior left lean Required Braces or Orthoses: No Restrictions Weight Bearing Restrictions: No Other Position/Activity Restrictions: monitor SpO2 and HR during therapeutic activities   Vital Signs Therapy Vitals Pulse Rate:  (78-91 throughout session) Oxygen Therapy SpO2:  (93-99% throughout) O2 Device: Trach collar FiO2 (%): 28 % O2 Flow Rate (L/min): 5 L/min Pulse Oximetry Type: Intermittent Pain Pain Assessment Pain Assessment: No/denies pain Pain Score: 0-No pain  Oral/Motor: Oral Motor/Sensory Function Labial ROM: Other (Comment) (generalized weakness) Labial Strength: Reduced Lingual ROM: Reduced right;Reduced left Lingual Strength: Reduced Motor Speech Intelligibility: Intelligibility reduced Comprehension: Auditory Comprehension Yes/No Questions: Impaired Basic Immediate Environment Questions: 75-100% accurate (90% accurate) Complex Questions: 50-74% accurate (70% accurate) Commands: Impaired Multistep Basic Commands: Other (comment) (2 step 100%, 3 step 0%) Conversation: Simple Interfering Components: Other (comment);Attention;Anxiety;Processing speed (trach with PMSV) EffectiveTechniques: Extra processing time;Repetition;Visual/Gestural cues Visual Recognition/Discrimination Discrimination: Exceptions to [facility] Reading Comprehension Reading Status: Impaired Interfering Components: Attention;Visual scanning;Processing time;Visual acuity;Eye glasses not available Effective Techniques: Large print;Other (comment) (simple print) Expression: Expression Primary Mode of Expression: Verbal Verbal Expression Initiation: Impaired Automatic Speech: Day of week;Month of year (DOW 6/7, MOY perseverated on DOW) Level of Generative/Spontaneous Verbalization: Word Repetition: No impairment Naming: No impairment Pragmatics: Impairment Impairments: Eye contact;Monotone Interfering Components: Attention;Other (comment) (Trach with intermittent PMSV  trials) Effective Techniques: Open ended questions;Semantic cues Non-Verbal Means of Communication: Other (comment) (mouthing ) Written Expression Written Expression: Not tested  Therapy/Group: Individual Therapy  Charlane Ferretti., CCC-SLP [phone] Lillia Lengel 12/25/2010 4:31 PM

## 2010-12-25 NOTE — Progress Notes (Signed)
Occupational Therapy Session Note  Patient Details  Name: [name] MRN: [id number] Date of Birth: 08/08/1955  Today's Date: 12/25/2010 Time: 7829-5621 Time Calculation (min): 41 min  Precautions: Precautions Precautions: Fall Precaution Comments: PMSV on with staff, meals; Full staff supervision with meals; Dys.2 with Nectar-thick liquids Required Braces or Orthoses: No Restrictions Weight Bearing Restrictions: No Other Position/Activity Restrictions: monitor SpO2 and HR during therapeutic activities  Short Term Goals: OT Short Term Goal 1: Pt will complete bathing with mod assist in seated position OT Short Term Goal 2: Pt will complete UB dressing with min assist  OT Short Term Goal 3: Pt will complete LB dressing with max assist in sit to stand position OT Short Term Goal 4: Pt will demonstrate improved unsupported sitting balance for 10 mins to complete UB bathing and dressing  Skilled Therapeutic Interventions/Progress Updates:    1:1 OT session with focus on bed mobility with rolling side to side and sidelying to sit.  Pt required min assist and placement of hands for rolling Rt and Lt, pt required more assist with rolling to Lt secondary to decreased strength in RUE.  Min verbal cues to get to sidelying and mod assist sidelying to sit.  Hip raises/bridges to increase core strength and assist with boosting up into bed.  Engaged in leaning followed by reaching out of BOS to Rt, Lt, forward, and backward to establish midline sitting and regain it with each lean.    Pt with no c/o pain this session  Therapy/Group: Individual Therapy  Leonette Monarch 12/25/2010, 3:38 PM

## 2010-12-25 NOTE — Progress Notes (Signed)
Physical Therapy Session Note  Patient Details  Name: [name] MRN: [id number] Date of Birth: 1955/02/13  Today's Date: 12/25/2010 Time: 1430-1530 Time Calculation (min): 60 min  Precautions: Precautions Precautions: Fall Precaution Comments: PMV with staff, posterior left lean Required Braces or Orthoses: No Restrictions Weight Bearing Restrictions: No Other Position/Activity Restrictions: monitor SpO2 and HR during therapeutic activities  Short Term Goals: PT Short Term Goal 1: Patient will perform bed mobility with mod assist. PT Short Term Goal 1 - Progress: Progressing toward goal PT Short Term Goal 2: Pt will maintain unsupported static sitting balance in midline > 5 min with supervision. PT Short Term Goal 2 - Progress: Progressing toward goal PT Short Term Goal 3: Patient will maintain dynamic sitting balance > 5 min with min assist. PT Short Term Goal 3 - Progress: Progressing toward goal PT Short Term Goal 4: Patient will tolerate OOB in chair > 60 min for increased activity tolerance. PT Short Term Goal 4 - Progress: Progressing toward goal PT Short Term Goal 5: Patient will maintain static standing balance > 1 min with mod assist. PT Short Term Goal 5 - Progress: Progressing toward goal  Skilled Therapeutic Interventions/Progress Updates:     General Chart Reviewed: Yes Family/Caregiver Present: Yes (brother) Vital Signs Therapy Vitals Pulse Rate:  (78-91 bpm throughout session) Oxygen Therapy SpO2:  (93-99% throughout) O2 Device: Trach collar FiO2 (%): 28 % O2 Flow Rate (L/min): 5 L/min Pulse Oximetry Type: Intermittent  Pain Pain Assessment Pain Assessment: No/denies pain Pain Score: 0-No pain  Other Treatments  Squat-pivot transfer training with mod-max assist, focus on anterior weight shift and left LE position to decrease posterior lean and left LE extensor tone. Seated dynamic balance activity with reaching across midline and forward to  increase anterior and lateral weight shift and trunk rotation. Patient reports seeing double. Occular alignment impaired with left eye mildly adducted and slower to track left and superiorly. ? If patch may facilitate clearer vision and eye alignment. Will follow-up with OT. Sit to squat position with 3-5 second holds, manual facilitation to maintain symmetrical weight bearing for increased anterior right weight shift and LE strength. Progressed to static standing with mod to max assist, focus on midline orientation, facilitation for right and anterior weight shift. Patient able to sense posterior lean this afternoon, but unable to initiate correction without assist. Patient very fatigued at end of session. Squat pivot back to W/C with over the back bobath technique and max assist.  Therapy/Group: Individual Therapy  [name] 12/25/2010, 4:05 PM

## 2010-12-25 NOTE — Progress Notes (Signed)
Social Work Assessment and Plan Assessment and Plan  Patient Name: [name]  ZOXWR'[id number] Date: 12/25/2010  Problem List:  Patient Active Problem List  Diagnoses  . Stroke  . Diabetes mellitus  . Respiratory failure  . A-fib  . Gout  . CAD (coronary artery disease)  . Hyperlipemia  . ICH (intracerebral hemorrhage)  . Hypernatremia  . Physical deconditioning    Past Medical History:  Past Medical History  Diagnosis Date  . Diabetes mellitus   . Coronary artery disease   . Hypertension   . Gout   . CHF (congestive heart failure)   . Afib   . Arthritis   . Hypertensive emergency 11/19/2010  . Pulmonary edema 11/19/2010  . Acute exacerbation of congestive heart failure 11/19/2010  . Thyroiditis 11/20/2010  . ICH (intracerebral hemorrhage) 12/22/2010  . Stroke 11/19/2010  . Respiratory failure 11/19/2010  . CAD (coronary artery disease) 11/20/2010  . Physical deconditioning 12/22/2010  . Diabetes mellitus 11/19/2010  . A-fib 11/19/2010  . Hyperlipemia 11/21/2010    Past Surgical History:  Past Surgical History  Procedure Date  . Tracheostomy tube placement 11/28/2010    Procedure: TRACHEOSTOMY;  Surgeon: Susy Frizzle, MD;  [facility];  Service: ENT;  Laterality: N/A;  . Peg placement 12/03/2010    Procedure: PERCUTANEOUS ENDOSCOPIC GASTROSTOMY (PEG) PLACEMENT;  Surgeon: Hart Carwin, MD;  Location: [facility] ENDOSCOPY;  Service: Endoscopy;  Laterality: N/A;    Discharge Planning  Discharge Planning Living Arrangements: Spouse/significant other Support Systems: Spouse/significant other;Friends/neighbors Assistance Needed: Will require 24 hour assist at discharge, brother aware Patient expects to be discharged to:: To families home with siblings providing care Case Management Consult Needed: Yes (Comment) (RNCM-already following)  Social/Family/Support Systems Social/Family/Support Systems Patient Roles: Other (Comment) (Sibling) Caregiver Availability:  24/7  Employment Status Employment Status Employment Status: Unemployed Date Retired/Disabled/Unemployed: Needs to apply for disability Legal Hisotry/Current Legal Issues: Brother's pursuing POA/Guardianship Guardian/Conservator: Ongoing at this time  Abuse/Neglect    Emotional Status Emotional Status Pt's affect, behavior adn adjustment status: Pt nods but unable to communicate. Brother reports he wants to get as independent as possible Recent Psychosocial Issues: other health issues Pyschiatric History: No history  Patient/Family Perceptions, Expectations & Goals Pt/Family Perceptions, Expectations and Goals Pt/Family understanding of illness & functional limitations: Brother can explain his medical issues.  He has been here most of the time pt has been in the hospital.  He speaks to the MD's daily. Premorbid pt/family roles/activities: Brother, Customer service manager, etc Anticipated changes in roles/activities/participation: Plans to resume these Pt/family expectations/goals: Brother states: " We want him to do as well as possible and recover, we will do what is needed to help him." [name]-brother is very supportive.  Community Resources [employer] Agencies: None Premorbid Home Care/DME Agencies: None Transportation available at discharge: Family Resource referrals recommended: Support group (specify) ([facility] Support Group)  Discharge Visual merchandiser Resources: Media planner (specify) Education officer, museum) Financial Resources: Family Support;Other (Comment) (Unemployment) Financial Screen Referred: Yes Living Expenses: Rent Money Management: Patient Home Management: Pt Patient/Family Preliminary Plans: Go to famlies home in [place] where siblings can provide 24 hour care  Clinical Impression:  Pleasant alert gentleman, sitting up in wheelchair eating. Brother-[name] is here observing and participating in therapies. Reports he will have 24 hour care  at home.      Lucy Chris 12/25/2010

## 2010-12-25 NOTE — Progress Notes (Signed)
Recreational Therapy Assessment and Plan  Patient Details  Name: [name] MRN: [id number] Date of Birth: [birth year]  Rehab Potential: Good ELOS: 4 weeks   Assessment Clinical Impression: Patient is a 55 y.o. year old male with history of COPD, DM, a-fib, CAD, and gout with hospital admission for Subacute right SCA CVA including midbrain and pons, acute large right cerebellar CVA with hemorrhagic transformation and edema compressing the 4th ventricle and mild hydrocephalus. Patient is s/p ventriculostomy to drain CSF on 11/24/10. Following the procedure, patient became unresponsive and comatose with CT positive for increased hemorrhage and right parietal infarct. Patient also had GIB on 12/15/10. Over hospital course, patient has been intubated and extubated several times with trach placed, currently with #6 cuff and PMV trials with SLP.  PTA pt lived alone in single-level home with no steps to enter. Patient was independent with all ADLs, IADLs, gait, and was driving. Currently retired from [employer] 5 years ago due to cardiac complications. Per patient and brother, brother will be able to assist as needed at discharge. Patient owns no DME at this time.   Patient presents to CIR with decreased activity tolerance, decreased balance,  decreased functional mobility, decreased initiation,  decreased sustained attention, increased processing time,  currently on 5L O2 with #6 trach and PMV trials with SLP limiting pt's independence with leisure/community pursuits.    Recreational Therapy Leisure History/Participation Premorbid leisure interest/current participation: [place]by Dawes - Fishing;Games - Cards;Nature - Vegetable gardening;Community - Shopping mall;Community - Journalist, newspaper - Travel (Comment) Other Leisure Interests: Television Leisure Participation Style: With Family/Friends;Alone Firefighter Appropriate for Education?: Yes Social interaction - Mood/Behavior:  Cooperative Recreational Therapy Orientation Orientation -Reviewed with patient: Available activity resources Strengths/Weaknesses Patient Strengths/Abilities: Willingness to participate Patient weaknesses: Physical limitations  Plan Rec Therapy Plan Rehab Potential: Good Treatment times per week: min 1 time per week for >20 minutes Estimated Length of Stay: 4 weeks Therapy Goals Achieved By:: Recreation/leisure participation;1:1 session;Adaptive equipment instruction;Patient/family education  Recommendations for other services: None  Discharge Criteria: Patient will be discharged from TR if patient refuses treatment 3 consecutive times without medical reason.  If treatment goals not met, if there is a change in medical status, if patient makes no progress towards goals or if patient is discharged from hospital.  The above assessment, treatment plan, treatment alternatives and goals were discussed and mutually agreed upon: by patient  [name] 12/25/2010, 4:19 PM

## 2010-12-25 NOTE — Progress Notes (Signed)
Occupational Therapy Note  Patient Details  Name: [name] MRN: [id number] Date of Birth: [birth year] Today's Date: 12/25/2010   [id number] Pain:  Right hand palm, with flexion of fingers.  Pain not scored Individual treatment Skilled clinical intervention:  Patient see this am for bathing and dressing session.  Patient's lower body  bathed and dressed at bed level (dependent) for energy conservation.  Patient struggled with elevated heart rate yesterday, with position change HR up to 212.  Today HR consistently under 150, and O2 saturation with trach and Passey Muir Valve on humidified air, at 90-100% with functional activity.  Sitting balance at edge of bed improving such that patient able to sit with upper extremity support for 3-5 seconds.      [name] 12/25/2010, 11:12 AM

## 2010-12-25 NOTE — Progress Notes (Signed)
2+ Mod assist stand pivot. Trach care performed. Peg tube flushed, patent. Pt tolerating bolus feedings at gravity. Began oral meal at supper. Pt fitted with passey muir valve to be used during therapies only. Pt up in chair tolerating well. Tired easily during therapies but participated to his best ability. Brother at bedside through out most of the day. Incontinent of bowels. Stool content mucous. Administered soap suds enema to cleanse bowel. Condom cath in place draining amber urine, pungent odor. Left upper PICC line cared for by IV team. Continue plan of care.

## 2010-12-26 ENCOUNTER — Inpatient Hospital Stay (HOSPITAL_COMMUNITY): Payer: 59

## 2010-12-26 LAB — GLUCOSE, CAPILLARY: Glucose-Capillary: 175 mg/dL — ABNORMAL HIGH (ref 70–99)

## 2010-12-26 NOTE — Progress Notes (Addendum)
Progress Notes  Speech Language Pathology Therapy Note  Patient Details  Name: [name] MRN: [id number] Date of Birth: 11-02-1955  Today's Date: 12/26/2010 Time: 1000-1055 Time Calculation (min): 55 min  Precautions: Precautions Precautions: Fall Precaution Comments: PMV with staff, posterior left lean Required Braces or Orthoses: No Restrictions Weight Bearing Restrictions: No Other Position/Activity Restrictions: monitor SpO2 and HR during therapeutic activities  Short Term Goals: set 12/24/10  1. Patient will demonstrate sustained attention to task for 10-12 minutes with minimal assist semantic cues  2. Patient will increase speech intelligibility at phrase level expression with moderate assist semantic cues to increase vocal intensity  3. Patient will consume dysphagia 2 (chopped) and nectar thick liquids showing no overt s/s of aspiration with the use of a chin tuck and other compensatory strategies with min assist  4. Patient will demonstrate day to day carryover of self care information with minimal assist semantic cues  5. Patient will use PMSV during all waking hours with conintuous SpO2 monitoring and saturation greater than 90% starting 12/24/10  6. Patient will don and doff PMSV with moderate assist semantic and tactile cues  Skilled Therapeutic Interventions/Progress Updates: Session focused on speech production; SLP facilitated session with upright posture in bed and, placement of PMSV and moderate semantic cues to expand expression to phrase length utterances with increased vocal intensity.  Patient required cues to utilize diaphragmatic breathing with max assist and max assist to self monitor vocal intensity.  Patient's speech is also characterized by a frontal lisp; however, he reports that that was premorbid.  Good toleration of PMSV with SpO2 between 95-100% with oxygen at 28% at trach site.  SLP facilitated recall by giving patient multi step verbal direction with  moderate faded to minimal assist semantic cues to complete.   Precautions/Restrictions  Restrictions Weight Bearing Restrictions: No  PMSV on all waking hours and with all p.o. Intake  Pain Pain Assessment Pain Assessment: No/denies pain Pain Score: 0-No pain  Oral/Motor: Oral Motor/Sensory Function Labial ROM: Other (Comment) (generalized weakness) Labial Strength: Reduced Lingual ROM: Reduced right;Reduced left Lingual Strength: Reduced Motor Speech Intelligibility: Intelligibility reduced Comprehension: Auditory Comprehension Yes/No Questions: Impaired Basic Immediate Environment Questions: 75-100% accurate (90% accurate) Complex Questions: 50-74% accurate (70% accurate) Commands: Impaired Multistep Basic Commands: Other (comment) (2 step 100%, 3 step 0%) Conversation: Simple Interfering Components: Other (comment);Attention;Anxiety;Processing speed (trach with PMSV) EffectiveTechniques: Extra processing time;Repetition;Visual/Gestural cues Visual Recognition/Discrimination Discrimination: Exceptions to [facility] Reading Comprehension Reading Status: Impaired Interfering Components: Attention;Visual scanning;Processing time;Visual acuity;Eye glasses not available Effective Techniques: Large print;Other (comment) (simple print) Expression: Expression Primary Mode of Expression: Verbal Verbal Expression Initiation: Impaired Automatic Speech: Day of week;Month of year (DOW 6/7, MOY perseverated on DOW) Level of Generative/Spontaneous Verbalization: Word Repetition: No impairment Naming: No impairment Pragmatics: Impairment Impairments: Eye contact;Monotone Interfering Components: Attention;Other (comment) (Trach with intermittent PMSV trials) Effective Techniques: Open ended questions;Semantic cues Non-Verbal Means of Communication: Other (comment) (mouthing ) Written Expression Written Expression: Not tested  Therapy/Group: Individual Therapy  Charlane Ferretti.,  CCC-SLP [phone] [name] 12/26/2010 11:09 AM

## 2010-12-26 NOTE — Progress Notes (Signed)
Physical Therapy Session Note  Patient Details  Name: [name] MRN: [id number] Date of Birth: May 06, 1955  Today's Date: 12/26/2010 Time: 1405 - 1445 40 minutes  Precautions: Precautions Precautions: Fall Precaution Comments: PMV with staff, posterior left lean Required Braces or Orthoses: No Restrictions Weight Bearing Restrictions: No Other Position/Activity Restrictions: monitor SpO2 and HR during therapeutic activities  Short Term Goals: PT Short Term Goal 1: Patient will perform bed mobility with mod assist. PT Short Term Goal 1 - Progress: Progressing toward goal PT Short Term Goal 2: Pt will maintain unsupported static sitting balance in midline > 5 min with supervision. PT Short Term Goal 2 - Progress: Progressing toward goal PT Short Term Goal 3: Patient will maintain dynamic sitting balance > 5 min with min assist. PT Short Term Goal 3 - Progress: Progressing toward goal PT Short Term Goal 4: Patient will tolerate OOB in chair > 60 min for increased activity tolerance. PT Short Term Goal 4 - Progress: Progressing toward goal PT Short Term Goal 5: Patient will maintain static standing balance > 1 min with mod assist. PT Short Term Goal 5 - Progress: Progressing toward goal  Skilled Therapeutic Interventions/Progress Updates:     General Chart Reviewed: Yes Family/Caregiver Present: Yes (brother Molly Maduro)  Vital Signs Therapy Vitals Pulse Rate:  (90-105 bmp throughout) Oxygen Therapy SpO2:  (95-100% throughout) O2 Device: None (Room air) (trach with PMV) Pulse Oximetry Type: Continuous  Pain Pain Assessment Pain Assessment: No/denies pain Pain Score: 0-No pain  Other Treatments  Patient extremely fatigued this afternoon. Declined OOB, but agreeable to supine LE therex for increased functional strength. Supine bilat SAQ, heel slides, clamshells, alternating marches, ankle pumps and hip abduction 2 x 10 reps each. Supine bridges 2 x 5 reps with 3 second holds.  Supine <-> sit max assist with focus on sequencing of task. Sit to squat position with 5 second holds progressing to sit to stand, facilitation for anterior weight shift and hip extension mod-max assist. Returned to supine with max assist at end of session.  Therapy/Group: Individual Therapy  [name] 12/26/2010, 3:57 PM

## 2010-12-26 NOTE — Progress Notes (Signed)
Occupational Therapy Session Note  Patient Details  Name: [name] MRN: [id number] Date of Birth: [birth year]  Today's Date: 12/26/2010 Time: 4098-1191 Time Calculation (min): 40 min  Precautions: Precautions Precautions: Fall Precaution Comments: PMV with staff, posterior left lean Required Braces or Orthoses: No Restrictions Weight Bearing Restrictions: No Other Position/Activity Restrictions: monitor SpO2 and HR during therapeutic activities  Short Term Goals: OT Short Term Goal 1: Pt will complete bathing with mod assist in seated position OT Short Term Goal 2: Pt will complete UB dressing with min assist  OT Short Term Goal 3: Pt will complete LB dressing with max assist in sit to stand position OT Short Term Goal 4: Pt will demonstrate improved unsupported sitting balance for 10 mins to complete UB bathing and dressing  Skilled Therapeutic Interventions/Progress Updates:    Upon entering room, patient found supine in bed with statement - "I'm sleepy". Initially asked patient what he noticed to be most difficult after the stroke and he stated, "walking and sitting up". Skilled intervention for focus on self-feeding, functional use of bilateral UEs, bed mobility, bridging with LEs/trunk, LB dressing, and grooming task. All tasks completed supine in bed. Patient refused UB dressing/changing shirt. After session, positioned patient in chair position for safe feeding with staff.   Pain Pain Assessment Pain Assessment: No/denies pain Pain Score: 0-No pain  Therapy/Group: Individual Therapy  Koree Schopf 12/26/2010, 12:10 PM

## 2010-12-26 NOTE — Progress Notes (Signed)
Physical Therapy Session Note  Patient Details  Name: [name] MRN: [id number] Date of Birth: March 05, 1955  Today's Date: 12/26/2010 Time: 1630 Time Calculation (min): 0 min  Precautions: Precautions Precautions: Fall Precaution Comments: PMV with staff, posterior left lean Required Braces or Orthoses: No Restrictions Weight Bearing Restrictions: No Other Position/Activity Restrictions: monitor SpO2 and HR during therapeutic activities  Short Term Goals: PT Short Term Goal 1: Patient will perform bed mobility with mod assist. PT Short Term Goal 1 - Progress: Progressing toward goal PT Short Term Goal 2: Pt will maintain unsupported static sitting balance in midline > 5 min with supervision. PT Short Term Goal 2 - Progress: Progressing toward goal PT Short Term Goal 3: Patient will maintain dynamic sitting balance > 5 min with min assist. PT Short Term Goal 3 - Progress: Progressing toward goal PT Short Term Goal 4: Patient will tolerate OOB in chair > 60 min for increased activity tolerance. PT Short Term Goal 4 - Progress: Progressing toward goal PT Short Term Goal 5: Patient will maintain static standing balance > 1 min with mod assist. PT Short Term Goal 5 - Progress: Progressing toward goal  Skilled Therapeutic Interventions/Progress Updates:     General Chart Reviewed: Yes Family/Caregiver Present: Yes (brother Molly Maduro)  Patient declined 30 min skilled PT this afternoon due to significant fatigue. Will follow up tomorrow.    [name] 12/26/2010, 5:30 PM

## 2010-12-26 NOTE — Procedures (Signed)
Tracheostomy Change Note  Patient Details:   Name: [name] DOB: 12/17/1955 MRN: [id number]    Airway Documentation: AIRWAYS (Active)     Airway 7.5 mm (Active)  Secured at (cm) 25 cm 11/26/2010  8:37 PM  Measured From Lips 11/26/2010  8:37 PM  Secured Location Right 11/26/2010  8:37 PM  Secured By [employer] 11/26/2010  8:37 PM  Tube Holder Repositioned Yes 11/26/2010  8:37 PM  Cuff Pressure (cm H2O) 24 cm H2O 11/26/2010  6:00 PM  Site Condition Cool;Dry 11/26/2010  8:37 PM     Airway (Active)     Evaluation  O2 sats: stable throughout Complications: No apparent complications Patient did tolerate procedure well. Bilateral Breath Sounds: Diminished    Carrington Clamp 12/26/2010, 7:41 AM

## 2010-12-26 NOTE — Progress Notes (Signed)
Patient ID: [name], male   DOB: 12/20/1955, 55 y.o.   MRN: [id number] Subjective/Complaints: Swallow eval improved pt now taking po.no breathing problems Review of Systems  HENT: Positive for congestion.        Trach #6 cuffed  Respiratory: Negative.   Gastrointestinal: Negative for nausea, abdominal pain and diarrhea.       PEG  Genitourinary: Positive for frequency.       Condom cath  All other systems reviewed and are negative.    Objective: Vital Signs: Blood pressure 131/83, pulse 78, temperature 100.2 F (37.9 C), temperature source Oral, resp. rate 20, height 6' (1.829 m), weight 97.6 kg (215 lb 2.7 oz), SpO2 98.00%. Dg Swallowing Func-no Report  12/24/2010  CLINICAL DATA: rule out aspiration   FLUOROSCOPY FOR SWALLOWING FUNCTION STUDY:  Fluoroscopy was provided for swallowing function study, which was  administered by a speech pathologist.  Final results and recommendations  from this study are contained within the speech pathology report.     Results for orders placed during the hospital encounter of 12/22/10 (from the past 72 hour(s))  CLOSTRIDIUM DIFFICILE BY PCR     Status: Normal   Collection Time   12/23/10  9:30 AM      Component Value Range Comment   C difficile by pcr NEGATIVE  NEGATIVE    GLUCOSE, CAPILLARY     Status: Abnormal   Collection Time   12/23/10 11:55 AM      Component Value Range Comment   Glucose-Capillary 127 (*) 70 - 99 (mg/dL)    Comment 1 Notify RN     GLUCOSE, CAPILLARY     Status: Abnormal   Collection Time   12/23/10  4:07 PM      Component Value Range Comment   Glucose-Capillary 131 (*) 70 - 99 (mg/dL)    Comment 1 Notify RN     BASIC METABOLIC PANEL     Status: Abnormal   Collection Time   12/23/10  5:09 PM      Component Value Range Comment   Sodium 145  135 - 145 (mEq/L)    Potassium 3.3 (*) 3.5 - 5.1 (mEq/L)    Chloride 104  96 - 112 (mEq/L)    CO2 30  19 - 32 (mEq/L)    Glucose, Bld 117 (*) 70 - 99 (mg/dL)    BUN 15   6 - 23 (mg/dL)    Creatinine, Ser 4.09  0.50 - 1.35 (mg/dL)    Calcium 9.4  8.4 - 10.5 (mg/dL)    GFR calc non Af Amer 81 (*) >90 (mL/min)    GFR calc Af Amer >90  >90 (mL/min)   GLUCOSE, CAPILLARY     Status: Abnormal   Collection Time   12/23/10  7:43 PM      Component Value Range Comment   Glucose-Capillary 130 (*) 70 - 99 (mg/dL)   GLUCOSE, CAPILLARY     Status: Abnormal   Collection Time   12/23/10 11:54 PM      Component Value Range Comment   Glucose-Capillary 156 (*) 70 - 99 (mg/dL)   GLUCOSE, CAPILLARY     Status: Abnormal   Collection Time   12/24/10  4:07 AM      Component Value Range Comment   Glucose-Capillary 62 (*) 70 - 99 (mg/dL)   GLUCOSE, CAPILLARY     Status: Normal   Collection Time   12/24/10  4:45 AM      Component Value Range Comment  Glucose-Capillary 85  70 - 99 (mg/dL)   BASIC METABOLIC PANEL     Status: Abnormal   Collection Time   12/24/10  6:40 AM      Component Value Range Comment   Sodium 144  135 - 145 (mEq/L)    Potassium 3.8  3.5 - 5.1 (mEq/L)    Chloride 103  96 - 112 (mEq/L)    CO2 32  19 - 32 (mEq/L)    Glucose, Bld 133 (*) 70 - 99 (mg/dL)    BUN 15  6 - 23 (mg/dL)    Creatinine, Ser 9.60  0.50 - 1.35 (mg/dL)    Calcium 9.5  8.4 - 10.5 (mg/dL)    GFR calc non Af Amer 78 (*) >90 (mL/min)    GFR calc Af Amer 90 (*) >90 (mL/min)   GLUCOSE, CAPILLARY     Status: Abnormal   Collection Time   12/24/10  7:17 AM      Component Value Range Comment   Glucose-Capillary 135 (*) 70 - 99 (mg/dL)    Comment 1 Notify RN     GLUCOSE, CAPILLARY     Status: Abnormal   Collection Time   12/24/10 11:43 AM      Component Value Range Comment   Glucose-Capillary 181 (*) 70 - 99 (mg/dL)    Comment 1 Notify RN     GLUCOSE, CAPILLARY     Status: Abnormal   Collection Time   12/24/10  4:10 PM      Component Value Range Comment   Glucose-Capillary 168 (*) 70 - 99 (mg/dL)    Comment 1 Notify RN     GLUCOSE, CAPILLARY     Status: Abnormal   Collection  Time   12/24/10  8:37 PM      Component Value Range Comment   Glucose-Capillary 175 (*) 70 - 99 (mg/dL)   GLUCOSE, CAPILLARY     Status: Normal   Collection Time   12/25/10  7:20 AM      Component Value Range Comment   Glucose-Capillary 98  70 - 99 (mg/dL)    Comment 1 Notify RN     GLUCOSE, CAPILLARY     Status: Abnormal   Collection Time   12/25/10 11:43 AM      Component Value Range Comment   Glucose-Capillary 159 (*) 70 - 99 (mg/dL)    Comment 1 Notify RN     GLUCOSE, CAPILLARY     Status: Abnormal   Collection Time   12/25/10  4:36 PM      Component Value Range Comment   Glucose-Capillary 122 (*) 70 - 99 (mg/dL)    Comment 1 Notify RN     GLUCOSE, CAPILLARY     Status: Abnormal   Collection Time   12/25/10  9:21 PM      Component Value Range Comment   Glucose-Capillary 148 (*) 70 - 99 (mg/dL)    Comment 1 Notify RN         Blood pressure 120/98, pulse 86, temperature 98.4 F (36.9 C), temperature source Oral, resp. rate 22, height 6' (1.829 m), weight 90 kg (198 lb 6.6 oz), SpO2 100.00%.  Patient sitting comfortably in his chair with #6 Trach in Pl. cuffed. No visible secretions of my evaluation. Ears and throat notable for trach as well as thrush over the tongue. Patient has weak cough with trach in place. We did not occluded this evaluation. He is fair oral motor control. No gross cranial nerve abnormalities I  can see on exam today. Patient was able to follow simple one-step commands. He communicated with simple yes no and shakes. He is nonverbal. He had diminished fine motor movement in all 4 limbs. He may have had some underlying ataxia on the right posterior difficult to discern given his extensive weakness throughout. Strength was grossly3/5 proximally in both upper limbs. Lower extremity grossly3/5 proximal to3/5 distally. Patient did have sensation to gross pain stimulation. Reflexes are 1+ grossly throughout. Patient had poor insight and and awareness. Memory is  difficult to assess given his language issues although it appeared to be impaired.  Heart was irregularly irregular. Chest was clear. Abdomen soft nontender. PEG site was clean and intact with no drainage. Skin throughout was generally intact. Mild edema in all 4 limbs at one plus, nonpitting. All 4 limbs were warm with 2+ pulses.    Assessment/Plan: 1. Functional deficits secondary to R cerebellar ICH severe R UE and R LE ataxia which require 3+ hours per day of interdisciplinary therapy in a comprehensive inpatient rehab setting.  Team conf today Physiatrist is providing close team supervision and 24 hour management of active medical problems listed below. Physiatrist and rehab team continue to assess barriers to discharge/monitor patient progress toward functional and medical goals. Mobility: Bed Mobility Bed Mobility: Yes Rolling Right: 2: Max assist;With rail Rolling Left: 3: Mod assist Right Sidelying to Sit: With rails;2: Max assist Sitting - Scoot to Edge of Bed: 1: +1 Total assist;Other (comment) (significant posterior right lean) Transfers Sit to Stand: 1: +1 Total assist Sit to Stand Details (indicate cue type and reason): significant posterior right lean, slow to initiate movement Stand to Sit: 1: +1 Total assist Stand Pivot Transfer Details (indicate cue type and reason): poor LE eccentric control with uncontrolled descent, significant posterior right lean Squat Pivot Transfers: 1: +2 Total assist Squat Pivot Transfer Details (indicate cue type and reason): total assist + 2 (pt = 50%), poor graded movement and weight shift, slow to initiate with LEs Ambulation/Gait Stairs: No (pre-gait level at this time, unsafe to attempt) Wheelchair Mobility Wheelchair Mobility: No  ADL:    Cognition: Cognition Overall Cognitive Status: Impaired Arousal/Alertness: Lethargic Orientation Level: Oriented to person;Oriented to place Attention: Sustained Sustained Attention:  Impaired Sustained Attention Impairment: Verbal basic;Functional basic (1-2 minutes) Memory: Impaired Memory Impairment: Decreased recall of new information;Decreased long term memory (recalled 2/3 words at 5 minutes) Decreased Long Term Memory: Verbal basic (unable to recall month Thanksgiving was in) Awareness: Impaired Awareness Impairment: Emergent impairment (moderate assist ) Problem Solving: Impaired Problem Solving Impairment: Functional basic (minimal assist) Executive Function: Self Monitoring;Self Correcting Self Monitoring: Impaired Self Monitoring Impairment: Verbal basic (inconsistently aware of verbal perseverations) Self Correcting: Impaired Self Correcting Impairment: Verbal basic (no attempts) Safety/Judgment: Appears intact Cognition Arousal/Alertness: Lethargic Orientation Level: Oriented to person;Oriented to place  2. Anticoagulation/DVT prophylaxis with Pharmaceutical:no anticoag use SCDs 3. Pain Management:monitor Patient Active Hospital Problem List: 4.  Afib cannot use warfarin due to ICH Diabetes mellitus (11/19/2010)    POA: Yes    Plan:reduce Lantus monitor CBG Respiratory failure (11/19/2010)    POA: Yes   Assessment: trach wean as able reduce to #6 cuffless today    Gout (11/20/2010)    POA: Yes   Assessment: inactive   Plan: monitor ICH (intracerebral hemorrhage) (12/22/2010)    POA: No   Assessment: R hemiataxia non progressive   Plan: no anticoag Low grade temp check UA C and S  4  Neala Miggins E 12/26/2010, 7:56  AM

## 2010-12-26 NOTE — Progress Notes (Signed)
Social Work Assessment and Plan Assessment and Plan  Patient Name: [name]  [id number] Date: 12/26/2010  Problem List:  Patient Active Problem List  Diagnoses  . Stroke  . Diabetes mellitus  . Respiratory failure  . A-fib  . Gout  . CAD (coronary artery disease)  . Hyperlipemia  . ICH (intracerebral hemorrhage)  . Hypernatremia  . Physical deconditioning    Past Medical History:  Past Medical History  Diagnosis Date  . Diabetes mellitus   . Coronary artery disease   . Hypertension   . Gout   . CHF (congestive heart failure)   . Afib   . Arthritis   . Hypertensive emergency 11/19/2010  . Pulmonary edema 11/19/2010  . Acute exacerbation of congestive heart failure 11/19/2010  . Thyroiditis 11/20/2010  . ICH (intracerebral hemorrhage) 12/22/2010  . Stroke 11/19/2010  . Respiratory failure 11/19/2010  . CAD (coronary artery disease) 11/20/2010  . Physical deconditioning 12/22/2010  . Diabetes mellitus 11/19/2010  . A-fib 11/19/2010  . Hyperlipemia 11/21/2010    Past Surgical History:  Past Surgical History  Procedure Date  . Tracheostomy tube placement 11/28/2010    Procedure: TRACHEOSTOMY;  Surgeon: Susy Frizzle, MD;  Location: [facility] OR;  Service: ENT;  Laterality: N/A;  . Peg placement 12/03/2010    Procedure: PERCUTANEOUS ENDOSCOPIC GASTROSTOMY (PEG) PLACEMENT;  Surgeon: Hart Carwin, MD;  Location: [facility] ENDOSCOPY;  Service: Endoscopy;  Laterality: N/A;    Discharge Planning  Discharge Planning Living Arrangements: Spouse/significant other Support Systems: Spouse/significant other;Friends/neighbors Assistance Needed: Will require 24 hour assist at discharge, brother aware Patient expects to be discharged to:: To families home with siblings providing care Case Management Consult Needed: Yes (Comment) (RNCM-already following)  Social/Family/Support Systems Social/Family/Support Systems Patient Roles: Other (Comment) (Sibling) Caregiver Availability:  24/7  Employment Status Employment Status Employment Status: Unemployed Date Retired/Disabled/Unemployed: Needs to apply for disability Legal Hisotry/Current Legal Issues: Brother's pursuing POA/Guardianship Guardian/Conservator: Ongoing at this time  Abuse/Neglect Abuse/Neglect Assessment (Assessment to be complete while patient is alone) Physical Abuse: Denies Verbal Abuse: Denies Sexual Abuse: Denies Exploitation of patient/patient's resources: Denies Self-Neglect: Denies  Emotional Status Emotional Status Pt's affect, behavior adn adjustment status: Pt nods but unable to communicate. Brother reports he wants to get as independent as possible Recent Psychosocial Issues: other health issues Pyschiatric History: No history  Patient/Family Perceptions, Expectations & Goals Pt/Family Perceptions, Expectations and Goals Pt/Family understanding of illness & functional limitations: Brother can explain his medical issues.  He has been here most of the time pt has been in the hospital.  He speaks to the MD's daily. Premorbid pt/family roles/activities: Brother, Customer service manager, etc Anticipated changes in roles/activities/participation: Plans to resume these Pt/family expectations/goals: Brother states: " We want him to do as well as possible and recover, we will do what is needed to help him." [name]-brother is very supportive.  Community Resources [employer] Agencies: None Premorbid Home Care/DME Agencies: None Transportation available at discharge: Family Resource referrals recommended: Support group (specify) ([facility] Support Group)  Discharge Visual merchandiser Resources: Media planner (specify) Education officer, museum) Financial Resources: Family Support;Other (Comment) (Unemployment) Financial Screen Referred: Yes Living Expenses: Rent Money Management: Patient Home Management: Pt Patient/Family Preliminary Plans: Go to famlies home in [place] where  siblings can provide 24 hour care  Clinical Impression:  Pt nodding and listening, while worker spoke. Brother-[name] is very supportive and hands on.  Aware pt will require 24 hour care At discharge.  Continue his coping throughout stay.  Lucy Chris 12/26/2010

## 2010-12-27 LAB — URINALYSIS, ROUTINE W REFLEX MICROSCOPIC
Bilirubin Urine: NEGATIVE
Ketones, ur: NEGATIVE mg/dL
Nitrite: POSITIVE — AB
Urobilinogen, UA: 0.2 mg/dL (ref 0.0–1.0)
pH: 7.5 (ref 5.0–8.0)

## 2010-12-27 LAB — GLUCOSE, CAPILLARY: Glucose-Capillary: 208 mg/dL — ABNORMAL HIGH (ref 70–99)

## 2010-12-27 LAB — URINE MICROSCOPIC-ADD ON

## 2010-12-27 MED ORDER — CEPHALEXIN 250 MG PO CAPS
250.0000 mg | ORAL_CAPSULE | Freq: Three times a day (TID) | ORAL | Status: DC
  Administered 2010-12-27 – 2010-12-28 (×3): 250 mg via ORAL
  Filled 2010-12-27 (×6): qty 1

## 2010-12-27 NOTE — Progress Notes (Signed)
Patient ID: [name], male   DOB: 1955/12/30, 55 y.o.   MRN: [id number] Subjective/Complaints:Severe dysarthria  Review of Systems  HENT: Positive for congestion.        Trach #6 cuffed  Respiratory: Negative.   Gastrointestinal: Negative for nausea, abdominal pain and diarrhea.       PEG  Genitourinary: Positive for frequency.       Condom cath  All other systems reviewed and are negative.    Objective: Vital Signs: Blood pressure 180/97, pulse 90, temperature 99.2 F (37.3 C), temperature source Oral, resp. rate 24, height 6' (1.829 m), weight 97.6 kg (215 lb 2.7 oz), SpO2 97.00%. Dg Chest Port 1v Same Day  12/26/2010  *RADIOLOGY REPORT*  Clinical Data: Low grade fever  PORTABLE CHEST - 1 VIEW SAME DAY  Comparison: Portable chest x-ray of 12/19/2010  Findings: Moderate cardiomegaly is stable.  The lungs are not quite as well aerated.  A left PICC line is present with the tip seen to the mid upper SVC.  Tracheostomy is noted.  IMPRESSION: Stable cardiomegaly.  No change in left PICC line position.  Original Report Authenticated By: Juline Patch, M.D.   Results for orders placed during the hospital encounter of 12/22/10 (from the past 72 hour(s))  GLUCOSE, CAPILLARY     Status: Abnormal   Collection Time   12/24/10 11:43 AM      Component Value Range Comment   Glucose-Capillary 181 (*) 70 - 99 (mg/dL)    Comment 1 Notify RN     GLUCOSE, CAPILLARY     Status: Abnormal   Collection Time   12/24/10  4:10 PM      Component Value Range Comment   Glucose-Capillary 168 (*) 70 - 99 (mg/dL)    Comment 1 Notify RN     GLUCOSE, CAPILLARY     Status: Abnormal   Collection Time   12/24/10  8:37 PM      Component Value Range Comment   Glucose-Capillary 175 (*) 70 - 99 (mg/dL)   GLUCOSE, CAPILLARY     Status: Normal   Collection Time   12/25/10  7:20 AM      Component Value Range Comment   Glucose-Capillary 98  70 - 99 (mg/dL)    Comment 1 Notify RN     GLUCOSE, CAPILLARY      Status: Abnormal   Collection Time   12/25/10 11:43 AM      Component Value Range Comment   Glucose-Capillary 159 (*) 70 - 99 (mg/dL)    Comment 1 Notify RN     GLUCOSE, CAPILLARY     Status: Abnormal   Collection Time   12/25/10  4:36 PM      Component Value Range Comment   Glucose-Capillary 122 (*) 70 - 99 (mg/dL)    Comment 1 Notify RN     GLUCOSE, CAPILLARY     Status: Abnormal   Collection Time   12/25/10  9:21 PM      Component Value Range Comment   Glucose-Capillary 148 (*) 70 - 99 (mg/dL)    Comment 1 Notify RN     GLUCOSE, CAPILLARY     Status: Normal   Collection Time   12/26/10  7:30 AM      Component Value Range Comment   Glucose-Capillary 91  70 - 99 (mg/dL)    Comment 1 Notify RN     GLUCOSE, CAPILLARY     Status: Abnormal   Collection Time   12/26/10  11:28 AM      Component Value Range Comment   Glucose-Capillary 170 (*) 70 - 99 (mg/dL)    Comment 1 Notify RN     GLUCOSE, CAPILLARY     Status: Abnormal   Collection Time   12/26/10  4:47 PM      Component Value Range Comment   Glucose-Capillary 163 (*) 70 - 99 (mg/dL)    Comment 1 Notify RN     GLUCOSE, CAPILLARY     Status: Abnormal   Collection Time   12/26/10  8:27 PM      Component Value Range Comment   Glucose-Capillary 175 (*) 70 - 99 (mg/dL)    Comment 1 Notify RN     URINALYSIS, ROUTINE W REFLEX MICROSCOPIC     Status: Abnormal   Collection Time   12/27/10  4:57 AM      Component Value Range Comment   Color, Urine YELLOW  YELLOW     APPearance CLOUDY (*) CLEAR     Specific Gravity, Urine 1.007  1.005 - 1.030     pH 7.5  5.0 - 8.0     Glucose, UA NEGATIVE  NEGATIVE (mg/dL)    Hgb urine dipstick SMALL (*) NEGATIVE     Bilirubin Urine NEGATIVE  NEGATIVE     Ketones, ur NEGATIVE  NEGATIVE (mg/dL)    Protein, ur 30 (*) NEGATIVE (mg/dL)    Urobilinogen, UA 0.2  0.0 - 1.0 (mg/dL)    Nitrite POSITIVE (*) NEGATIVE     Leukocytes, UA LARGE (*) NEGATIVE    URINE MICROSCOPIC-ADD ON     Status:  Abnormal   Collection Time   12/27/10  4:57 AM      Component Value Range Comment   WBC, UA TOO NUMEROUS TO COUNT  <3 (WBC/hpf)    RBC / HPF 0-2  <3 (RBC/hpf)    Bacteria, UA MANY (*) RARE    GLUCOSE, CAPILLARY     Status: Abnormal   Collection Time   12/27/10  7:47 AM      Component Value Range Comment   Glucose-Capillary 137 (*) 70 - 99 (mg/dL)    Comment 1 Notify RN         Blood pressure 120/98, pulse 86, temperature 98.4 F (36.9 C), temperature source Oral, resp. rate 22, height 6' (1.829 m), weight 90 kg (198 lb 6.6 oz), SpO2 100.00%.  Patient sitting comfortably in his chair with #6 Trach in Pl. cuffed. No visible secretions of my evaluation. Ears and throat notable for trach as well as thrush over the tongue. Patient has weak cough with trach in place. Trach site with granulation tissue at base He is fair oral motor control. No gross cranial nerve abnormalities I can see on exam today. Patient was able to follow simple one-step commands. He communicated with simple yes no and shakes. He is nonverbal. He had diminished fine motor movement in all 4 limbs. He may have had some underlying ataxia on the right posterior difficult to discern given his extensive weakness throughout. Strength was grossly3/5 proximally in both upper limbs. Lower extremity grossly3/5 proximal to3/5 distally. Patient did have sensation to gross pain stimulation. Reflexes are 1+ grossly throughout. Patient had poor insight and and awareness. Memory is difficult to assess given his language issues although it appeared to be impaired.  Heart was irregularly irregular. Chest was clear. Abdomen soft nontender. PEG site was clean and intact with no drainage. Skin throughout was generally intact. Mild edema in all  4 limbs at one plus, nonpitting. All 4 limbs were warm with 2+ pulses.    Assessment/Plan: 1. Functional deficits secondary to R cerebellar ICH severe R UE and R LE ataxia which require 3+ hours per day of  interdisciplinary therapy in a comprehensive inpatient rehab setting.  Team conf today Physiatrist is providing close team supervision and 24 hour management of active medical problems listed below. Physiatrist and rehab team continue to assess barriers to discharge/monitor patient progress toward functional and medical goals. Mobility: Bed Mobility Bed Mobility: Yes Rolling Right: 2: Max assist;With rail Rolling Left: 3: Mod assist Right Sidelying to Sit: With rails;2: Max assist Sitting - Scoot to Edge of Bed: 1: +1 Total assist;Other (comment) (significant posterior right lean) Transfers Sit to Stand: 1: +1 Total assist Sit to Stand Details (indicate cue type and reason): significant posterior right lean, slow to initiate movement Stand to Sit: 1: +1 Total assist Stand Pivot Transfer Details (indicate cue type and reason): poor LE eccentric control with uncontrolled descent, significant posterior right lean Squat Pivot Transfers: 1: +2 Total assist Squat Pivot Transfer Details (indicate cue type and reason): total assist + 2 (pt = 50%), poor graded movement and weight shift, slow to initiate with LEs Ambulation/Gait Stairs: No (pre-gait level at this time, unsafe to attempt) Wheelchair Mobility Wheelchair Mobility: No  ADL:    Cognition: Cognition Overall Cognitive Status: Impaired Arousal/Alertness: Lethargic Orientation Level: Oriented to person;Oriented to place Attention: Sustained Sustained Attention: Impaired Sustained Attention Impairment: Verbal basic;Functional basic (1-2 minutes) Memory: Impaired Memory Impairment: Decreased recall of new information;Decreased long term memory (recalled 2/3 words at 5 minutes) Decreased Long Term Memory: Verbal basic (unable to recall month Thanksgiving was in) Awareness: Impaired Awareness Impairment: Emergent impairment (moderate assist ) Problem Solving: Impaired Problem Solving Impairment: Functional basic (minimal  assist) Executive Function: Self Monitoring;Self Correcting Self Monitoring: Impaired Self Monitoring Impairment: Verbal basic (inconsistently aware of verbal perseverations) Self Correcting: Impaired Self Correcting Impairment: Verbal basic (no attempts) Safety/Judgment: Appears intact Cognition Arousal/Alertness: Lethargic Orientation Level: Oriented to person;Oriented to place  2. Anticoagulation/DVT prophylaxis with Pharmaceutical:no anticoag use SCDs 3. Pain Management:monitor Patient Active Hospital Problem List: 4.  Afib cannot use warfarin due to ICH Diabetes mellitus (11/19/2010)    POA: Yes    Plan:reduce Lantus monitor CBG Respiratory failure (11/19/2010)    POA: Yes   Assessment: trach wean as able reduce to #6 cuffless today    Gout (11/20/2010)    POA: Yes   Assessment: inactive   Plan: monitor ICH (intracerebral hemorrhage) (12/22/2010)    POA: No   Assessment: R hemiataxia non progressive   Plan: no anticoag Low grade temp  UA positive , C and S pending start Keflex pending sensitivity  5  Montanna Mcbain E 12/27/2010, 8:20 AM

## 2010-12-27 NOTE — Progress Notes (Signed)
Progress Notes  Speech Language Pathology Therapy Note  Patient Details  Name: SAMARTH OGLE MRN: [id number] Date of Birth: [birth year]  Today's Date: 12/27/2010 Time: 1430-1500 Time Calculation (min): 30 min  Precautions: Precautions Precautions: Fall Precaution Comments: PMV with staff, posterior left lean Required Braces or Orthoses: No Restrictions Weight Bearing Restrictions: No Other Position/Activity Restrictions: monitor SpO2 and HR during therapeutic activities  Short Term Goals:  Skilled Therapeutic Interventions/Progress Updates:  Precautions/Restrictions    General    Vital Signs Therapy Vitals Temp: 98.9 F (37.2 C) Temp src: Oral Pulse Rate: 79  Resp: 20  BP: 130/98 mmHg Patient Position, if appropriate: Sitting Oxygen Therapy SpO2: 98 % O2 Device: Trach collar FiO2 (%): 21 % O2 Flow Rate (L/min): 5 L/min Pain Pain Assessment Pain Assessment: No/denies pain  Oral/Motor: Oral Motor/Sensory Function Labial ROM: Reduced left Labial Symmetry: Abnormal symmetry left;Abnormal symmetry right Labial Strength: Reduced Lingual ROM: Reduced right;Reduced left Lingual Strength: Reduced Motor Speech Intelligibility: Intelligibility reduced Comprehension: Auditory Comprehension Yes/No Questions: Impaired Basic Immediate Environment Questions: 75-100% accurate Complex Questions: 50-74% accurate Commands: Impaired Multistep Basic Commands: 50-74% accurate Conversation: Simple Interfering Components: Anxiety;Processing speed (trach with PMSV) EffectiveTechniques: Extra processing time;Repetition;Visual/Gestural cues Visual Recognition/Discrimination Discrimination: Exceptions to [facility] Reading Comprehension Reading Status: Impaired Interfering Components: Attention;Visual scanning;Processing time;Visual acuity;Eye glasses not available Effective Techniques: Large print;Other (comment) (simple print) Expression: Expression Primary Mode of Expression:  Verbal Verbal Expression Initiation: Impaired Automatic Speech: Day of week;Month of year (DOW 6/7, MOY perseverated on DOW) Level of Generative/Spontaneous Verbalization: Word Repetition: No impairment Naming: Not tested Pragmatics: Impairment Impairments: Monotone;Eye contact Interfering Components: Attention Effective Techniques: Open ended questions;Semantic cues Non-Verbal Means of Communication: Not applicable Written Expression Written Expression: Not tested  Therapy/Group: Individual Therapy  Alice Reichert Radene Journey 12/27/2010 5:17 PM

## 2010-12-27 NOTE — Progress Notes (Signed)
Physical Therapy Session Note  Patient Details  Name: [name] MRN: [id number] Date of Birth: 02/05/1955  Today's Date: 12/27/2010 Time: 1191-4782 Time Calculation (min): 30 min  Precautions: Precautions Precautions: Fall Precaution Comments: PMV with staff, posterior left lean Required Braces or Orthoses: No Restrictions Weight Bearing Restrictions: No Other Position/Activity Restrictions: monitor SpO2 and HR during therapeutic activities  Short Term Goals: PT Short Term Goal 1: Patient will perform bed mobility with mod assist. PT Short Term Goal 1 - Progress: Progressing toward goal PT Short Term Goal 2: Pt will maintain unsupported static sitting balance in midline > 5 min with supervision. PT Short Term Goal 2 - Progress: Progressing toward goal PT Short Term Goal 3: Patient will maintain dynamic sitting balance > 5 min with min assist. PT Short Term Goal 3 - Progress: Progressing toward goal PT Short Term Goal 4: Patient will tolerate OOB in chair > 60 min for increased activity tolerance. PT Short Term Goal 4 - Progress: Progressing toward goal PT Short Term Goal 5: Patient will maintain static standing balance > 1 min with mod assist. PT Short Term Goal 5 - Progress: Progressing toward goal  Skilled Therapeutic Interventions/Progress Updates:  Tx focused on standing for LE strengthening, increasing activity tolerance, and safety with functional mobility. Sit<>stand in //bars x4 with Max A for lifting/lowering with bil UE use and manual facilitation for L LE and blocking knees. First attempt standing approximately 60sec, decreasing to 15 sec by final trial due to fatigue.Cues for posture and weight shifting. Seated rest breaks between trials. VSS throughout.     Pain: 7/10 in bil LEs, repositioned and modified activity   Therapy/Group: Individual Therapy  Iona Coach 12/27/2010, 4:02 PM

## 2010-12-27 NOTE — Progress Notes (Signed)
Pt alert/oriented x2, decreased safety awareness, bed alarm, safety belt in use, out of bed with 2+ stand/pivot, generalized weakness, lt side upper/lower weakness noted, left facial droop noted, full supervision for meals, dys 2/nectar thick liquids, takes medications whole in applesauce, has #6 non disposable Shiley trach, using Passey muire  valve during day, saturations 95-99% on 28% trach collar, trach care given at  2 pm, old dressing with small amt dried brown output noted, incontinent of bowel/bladder, condom catheter on, last BM 12/14, dressing to peg site changed, will continue with plan of care

## 2010-12-27 NOTE — Progress Notes (Signed)
Nursing: trach care performed.pt tolerated well/scant amount of clear secretions on tube.pt has open area at base of tube w/ thick,brown drainage.wbb

## 2010-12-27 NOTE — Progress Notes (Addendum)
Physical Therapy Session Note  Patient Details  Name: [name] MRN: [id number] Date of Birth: 08-Jan-1956  Today's Date: 12/27/2010 Time: 10:20-11: 15  Precautions: Precautions Precautions: Fall Precaution Comments: PMV with staff, posterior left lean Required Braces or Orthoses: No Restrictions Weight Bearing Restrictions: No Other Position/Activity Restrictions: monitor SpO2 and HR during therapeutic activities  Short Term Goals: PT Short Term Goal 1: Patient will perform bed mobility with mod assist. PT Short Term Goal 1 - Progress: Progressing toward goal PT Short Term Goal 2: Pt will maintain unsupported static sitting balance in midline > 5 min with supervision. PT Short Term Goal 2 - Progress: Progressing toward goal PT Short Term Goal 3: Patient will maintain dynamic sitting balance > 5 min with min assist. PT Short Term Goal 3 - Progress: Progressing toward goal PT Short Term Goal 4: Patient will tolerate OOB in chair > 60 min for increased activity tolerance. PT Short Term Goal 4 - Progress: Progressing toward goal PT Short Term Goal 5: Patient will maintain static standing balance > 1 min with mod assist. PT Short Term Goal 5 - Progress: Progressing toward goal  Skilled Therapeutic Interventions/Progress Updates:    Tx focused on LE strengthening, sit<>squat transfers, and sitting balance for trunk control and activity tolerance. Pt able to complete LE there-ex in sitting with 2# ankle weights and manual resistance, cues for technique and to complete sets.  Repeated sit<>squats with Mod>Max A as pt became fatigued. Pt tended to lean head/trunk to L and hips R with decreased L LE weight bearing. Cues and manual facilitation given to correct, but pt unable to self-adjust very much. Cues for hand placement as well.  Mod A for scooting to edge of chair with cues for technique and UE A. Dynamic sitting balance at edge of WC, reaching outside BOS in all directions. Pt used  RUE to A LUE with reaching.  Stand-pivot transfer WC>bed with Max A and step-by-step cues for timing, sequence, and safety. Sit>supine Max A. Pt very fatigued at end of tx.   General   Vital Signs Therapy Vitals Pulse Rate: 95  Resp: 23  Patient Position, if appropriate: Sitting Oxygen Therapy SpO2: 98 % O2 Device: Trach collar FiO2 (%): 21 % Pain Pain Assessment Pain Assessment: No/denies pain Pain Score:   8 Pain Location: Leg Pain Orientation: Right;Left Pain Descriptors: Aching Pain Intervention(s): RN made aware    Exercises All performed 2 sets, except for sit<>stands General Exercises - Lower Extremity Ankle Circles/Pumps: AROM;Strengthening;Both;20 reps;Seated Long Arc Quad: AROM;Strengthening;Both;15 reps;Seated (2# weights) Hip ABduction/ADduction: AROM;AAROM;Both;15 reps;Seated (Manual resistance hip ABD and pillow squeezewith 3 sec holds) Hip Flexion/Marching: AROM;Strengthening;Both;10 reps;Seated (2# weights) Repetitive Sit to Stands: Two upper extremities;10 reps Repetitive Sit to Stands Level of Assist: Max Repetitive Sit to Stands Surface Height: WC  Other Treatments    Therapy/Group: Individual Therapy  Iona Coach, PT  12/27/2010, 10:55 AM

## 2010-12-27 NOTE — Progress Notes (Signed)
Occupational Therapy Session Note  Patient Details  Name: [name] MRN: [id number] Date of Birth: 1955-10-17  Today's Date: 12/27/2010 Time:  - 0800-0910   Precautions: Precautions Precautions: Fall Precaution Comments: PMV with staff, posterior left lean Required Braces or Orthoses: No Restrictions Weight Bearing Restrictions: No Other Position/Activity Restrictions: monitor SpO2 and HR during therapeutic activities  Short Term Goals: OT Short Term Goal 1: Pt will complete bathing with mod assist in seated position OT Short Term Goal 2: Pt will complete UB dressing with min assist  OT Short Term Goal 3: Pt will complete LB dressing with max assist in sit to stand position OT Short Term Goal 4: Pt will demonstrate improved unsupported sitting balance for 10 mins to complete UB bathing and dressing  Skilled Therapeutic Interventions/Progress Updates:    Addressed EOB balance with max facilitation to maintain midline control, sit to stand, stand pivot transfers to [facility].  Pt had bowel incontinence when standing.    Pain: none    Therapy/Group: Individual Therapy  Humberto Seals 12/27/2010, 9:10 AM

## 2010-12-27 NOTE — Progress Notes (Signed)
Occupational Therapy Session Note  Patient Details  Name: [name] MRN: [id number] Date of Birth: [birth year]  Today's Date: 12/27/2010 Time: 1430-1500 Time Calculation (min): 30 min  Precautions: Precautions Precautions: Fall Precaution Comments: PMV with staff, posterior left lean Required Braces or Orthoses: No Restrictions Weight Bearing Restrictions: No Other Position/Activity Restrictions: monitor SpO2 and HR during therapeutic activities  Short Term Goals: OT Short Term Goal 1: Pt will complete bathing with mod assist in seated position OT Short Term Goal 2: Pt will complete UB dressing with min assist  OT Short Term Goal 3: Pt will complete LB dressing with max assist in sit to stand position OT Short Term Goal 4: Pt will demonstrate improved unsupported sitting balance for 10 mins to complete UB bathing and dressing  Skilled Therapeutic Interventions/Progress Updates: today seen for left UE neuro -reeducation and ROM; patient with c/o double vision and would benefit from visual motor/perceptual treatment    Pain no c/os    Therapy/Group: Individual Therapy  Bud Face [facility] 12/27/2010, 5:01 PM

## 2010-12-27 NOTE — Progress Notes (Signed)
Went to assess patient and he was already gone for rehabilitation. Will follow up at 1600.

## 2010-12-28 LAB — GLUCOSE, CAPILLARY
Glucose-Capillary: 140 mg/dL — ABNORMAL HIGH (ref 70–99)
Glucose-Capillary: 157 mg/dL — ABNORMAL HIGH (ref 70–99)
Glucose-Capillary: 189 mg/dL — ABNORMAL HIGH (ref 70–99)

## 2010-12-28 MED ORDER — CIPROFLOXACIN HCL 250 MG PO TABS
250.0000 mg | ORAL_TABLET | Freq: Two times a day (BID) | ORAL | Status: DC
  Administered 2010-12-28 – 2011-01-05 (×17): 250 mg via ORAL
  Filled 2010-12-28 (×19): qty 1

## 2010-12-28 NOTE — Progress Notes (Signed)
Patient ID: [name], male   DOB: [birth year], 55 y.o.   MRN: [id number] Subjective/Complaints:Speech intell much improved, vocal volume much better  Review of Systems  HENT: Positive for congestion.        Trach #6 cuffed  Respiratory: Negative.   Gastrointestinal: Negative for nausea, abdominal pain and diarrhea.       PEG  Genitourinary: Positive for frequency.       Condom cath  All other systems reviewed and are negative.    Objective: Vital Signs: Blood pressure 158/101, pulse 84, temperature 100.3 F (37.9 C), temperature source Oral, resp. rate 20, height 6' (1.829 m), weight 96 kg (211 lb 10.3 oz), SpO2 100.00%. Dg Chest Port 1v Same Day  12/26/2010  *RADIOLOGY REPORT*  Clinical Data: Low grade fever  PORTABLE CHEST - 1 VIEW SAME DAY  Comparison: Portable chest x-ray of 12/19/2010  Findings: Moderate cardiomegaly is stable.  The lungs are not quite as well aerated.  A left PICC line is present with the tip seen to the mid upper SVC.  Tracheostomy is noted.  IMPRESSION: Stable cardiomegaly.  No change in left PICC line position.  Original Report Authenticated By: Juline Patch, M.D.   Results for orders placed during the hospital encounter of 12/22/10 (from the past 72 hour(s))  GLUCOSE, CAPILLARY     Status: Abnormal   Collection Time   12/25/10 11:43 AM      Component Value Range Comment   Glucose-Capillary 159 (*) 70 - 99 (mg/dL)    Comment 1 Notify RN     GLUCOSE, CAPILLARY     Status: Abnormal   Collection Time   12/25/10  4:36 PM      Component Value Range Comment   Glucose-Capillary 122 (*) 70 - 99 (mg/dL)    Comment 1 Notify RN     GLUCOSE, CAPILLARY     Status: Abnormal   Collection Time   12/25/10  9:21 PM      Component Value Range Comment   Glucose-Capillary 148 (*) 70 - 99 (mg/dL)    Comment 1 Notify RN     GLUCOSE, CAPILLARY     Status: Normal   Collection Time   12/26/10  7:30 AM      Component Value Range Comment   Glucose-Capillary 91  70 - 99  (mg/dL)    Comment 1 Notify RN     GLUCOSE, CAPILLARY     Status: Abnormal   Collection Time   12/26/10 11:28 AM      Component Value Range Comment   Glucose-Capillary 170 (*) 70 - 99 (mg/dL)    Comment 1 Notify RN     GLUCOSE, CAPILLARY     Status: Abnormal   Collection Time   12/26/10  4:47 PM      Component Value Range Comment   Glucose-Capillary 163 (*) 70 - 99 (mg/dL)    Comment 1 Notify RN     GLUCOSE, CAPILLARY     Status: Abnormal   Collection Time   12/26/10  8:27 PM      Component Value Range Comment   Glucose-Capillary 175 (*) 70 - 99 (mg/dL)    Comment 1 Notify RN     URINALYSIS, ROUTINE W REFLEX MICROSCOPIC     Status: Abnormal   Collection Time   12/27/10  4:57 AM      Component Value Range Comment   Color, Urine YELLOW  YELLOW     APPearance CLOUDY (*) CLEAR  Specific Gravity, Urine 1.007  1.005 - 1.030     pH 7.5  5.0 - 8.0     Glucose, UA NEGATIVE  NEGATIVE (mg/dL)    Hgb urine dipstick SMALL (*) NEGATIVE     Bilirubin Urine NEGATIVE  NEGATIVE     Ketones, ur NEGATIVE  NEGATIVE (mg/dL)    Protein, ur 30 (*) NEGATIVE (mg/dL)    Urobilinogen, UA 0.2  0.0 - 1.0 (mg/dL)    Nitrite POSITIVE (*) NEGATIVE     Leukocytes, UA LARGE (*) NEGATIVE    URINE MICROSCOPIC-ADD ON     Status: Abnormal   Collection Time   12/27/10  4:57 AM      Component Value Range Comment   WBC, UA TOO NUMEROUS TO COUNT  <3 (WBC/hpf)    RBC / HPF 0-2  <3 (RBC/hpf)    Bacteria, UA MANY (*) RARE    GLUCOSE, CAPILLARY     Status: Abnormal   Collection Time   12/27/10  7:47 AM      Component Value Range Comment   Glucose-Capillary 137 (*) 70 - 99 (mg/dL)    Comment 1 Notify RN     GLUCOSE, CAPILLARY     Status: Abnormal   Collection Time   12/27/10 11:00 AM      Component Value Range Comment   Glucose-Capillary 229 (*) 70 - 99 (mg/dL)    Comment 1 Notify RN     GLUCOSE, CAPILLARY     Status: Abnormal   Collection Time   12/27/10  4:23 PM      Component Value Range Comment    Glucose-Capillary 152 (*) 70 - 99 (mg/dL)    Comment 1 Notify RN     GLUCOSE, CAPILLARY     Status: Abnormal   Collection Time   12/27/10  9:58 PM      Component Value Range Comment   Glucose-Capillary 208 (*) 70 - 99 (mg/dL)   GLUCOSE, CAPILLARY     Status: Abnormal   Collection Time   12/28/10  7:32 AM      Component Value Range Comment   Glucose-Capillary 140 (*) 70 - 99 (mg/dL)    Comment 1 Notify RN         Blood pressure 120/98, pulse 86, temperature 98.4 F (36.9 C), temperature source Oral, resp. rate 22, height 6' (1.829 m), weight 90 kg (198 lb 6.6 oz), SpO2 100.00%.  Patient sitting comfortably in his chair with #6 Trach in Pl. cuffed. No visible secretions of my evaluation. Ears and throat notable for trach as well as thrush over the tongue. Patient has weak cough with trach in place. Trach site with granulation tissue at base He is fair oral motor control. No gross cranial nerve abnormalities I can see on exam today. Patient was able to follow simple one-step commands. He communicated with simple yes no and shakes. He is nonverbal. He had diminished fine motor movement in all 4 limbs. He may have had some underlying ataxia on the right posterior difficult to discern given his extensive weakness throughout. Strength was grossly3/5 proximally in both upper limbs. Lower extremity grossly3/5 proximal to3/5 distally. Patient did have sensation to gross pain stimulation. Reflexes are 1+ grossly throughout. Patient had poor insight and and awareness. Memory is difficult to assess given his language issues although it appeared to be impaired.  Heart was irregularly irregular. Chest was clear. Abdomen soft nontender. PEG site was clean and intact with no drainage. Skin throughout was generally intact.  Mild edema in all 4 limbs at one plus,@+ LUE. PICC site without erythema All 4 limbs were warm with 2+ pulses.    Assessment/Plan: 1. Functional deficits secondary to R cerebellar ICH severe  R UE and R LE ataxia which require 3+ hours per day of interdisciplinary therapy in a comprehensive inpatient rehab setting.  Team conf today Physiatrist is providing close team supervision and 24 hour management of active medical problems listed below. Physiatrist and rehab team continue to assess barriers to discharge/monitor patient progress toward functional and medical goals. Mobility: Bed Mobility Bed Mobility: Yes Rolling Right: 2: Max assist;With rail Rolling Left: 3: Mod assist Right Sidelying to Sit: With rails;2: Max assist Sitting - Scoot to Edge of Bed: 1: +1 Total assist;Other (comment) (significant posterior right lean) Transfers Sit to Stand: 1: +1 Total assist Sit to Stand Details (indicate cue type and reason): significant posterior right lean, slow to initiate movement Stand to Sit: 1: +1 Total assist Stand Pivot Transfer Details (indicate cue type and reason): poor LE eccentric control with uncontrolled descent, significant posterior right lean Squat Pivot Transfers: 1: +2 Total assist Squat Pivot Transfer Details (indicate cue type and reason): total assist + 2 (pt = 50%), poor graded movement and weight shift, slow to initiate with LEs Ambulation/Gait Stairs: No (pre-gait level at this time, unsafe to attempt) Naval architect Mobility: No  ADL:    Cognition: Cognition Overall Cognitive Status: Impaired Arousal/Alertness: Awake/alert Orientation Level: Oriented to person;Oriented to place Attention: Sustained Sustained Attention: Impaired Sustained Attention Impairment: Verbal basic;Functional basic Memory: Impaired Memory Impairment: Decreased recall of new information;Decreased long term memory Decreased Long Term Memory: Verbal basic;Functional basic Awareness: Impaired Awareness Impairment: Emergent impairment Problem Solving: Impaired Problem Solving Impairment: Verbal basic;Functional basic Executive Function: Self Monitoring;Self  Correcting Self Monitoring: Impaired Self Monitoring Impairment: Functional basic Self Correcting: Impaired Self Correcting Impairment: Verbal basic;Functional basic Safety/Judgment: Appears intact Cognition Arousal/Alertness: Awake/alert Orientation Level: Oriented to person;Oriented to place  2. Anticoagulation/DVT prophylaxis with Pharmaceutical:no anticoag use SCDs 3. Pain Management:monitor Patient Active Hospital Problem List: 4.  Afib cannot use warfarin due to ICH Diabetes mellitus (11/19/2010)    POA: Yes 5.  LUE edema, D/C PICC   Plan:reduce Lantus monitor CBG Respiratory failure (11/19/2010)    POA: Yes   Assessment: trach wean as able reduce to #6 cuffless today    Gout (11/20/2010)    POA: Yes   Assessment: inactive   Plan: monitor ICH (intracerebral hemorrhage) (12/22/2010)    POA: No   Assessment: R hemiataxia non progressive   Plan: no anticoag Low grade temp  UA positive , C and S ecoli and kleb switch to cipro  6  KIRSTEINS,ANDREW E 12/28/2010, 8:22 AM

## 2010-12-28 NOTE — Progress Notes (Signed)
Pt alert/oriented x 3, generalized weakness slow to respond,poor safety awareness, bed alarm in use, safety belt when up to chair,  able to move extremities with limited ROM, up with 2+stand pivot, #6 non disposable Shiley trach in, pt able to cough up secretions, using Passey muire valve during day with o2 sats above 92% with 28% trach collar , area proximal to stoma with skin breakdown, tissue yellow/pink, Allevyn placed by therapy to site, drain sponge with scant amt sero sang drainage noted, full supervision for meals dys 2/nectar thick, takes meds whole in apple sauce, peg site intact with dry scabbed skin to site, cleaned with saline and new drain sponge to site, condom cath off durin day with timed toileting, continent of urine , last BM 14th, will continue with plan of care

## 2010-12-29 LAB — GLUCOSE, CAPILLARY

## 2010-12-29 MED ORDER — FREE WATER
100.0000 mL | Freq: Three times a day (TID) | Status: DC
  Administered 2010-12-29 – 2011-01-15 (×49): 100 mL
  Filled 2010-12-29 (×66): qty 100

## 2010-12-29 NOTE — Progress Notes (Signed)
Clarification of progress note from earlier today:  Pt had tpa instilled into the picc line back on Nov 21st, but HAS NOT had TPA used since that date;

## 2010-12-29 NOTE — Progress Notes (Signed)
Physical Therapy Session Note  Patient Details  Name: [name] MRN: [id number] Date of Birth: 1955-09-25  Today's Date: 12/29/2010 Time: 4098-1191 Time Calculation (min): 25 min  Precautions: Precautions Precautions: Fall Precaution Comments: PMV with staff, posterior left lean Required Braces or Orthoses: No Restrictions Weight Bearing Restrictions: No Other Position/Activity Restrictions: monitor SpO2 and HR during therapeutic activities  Short Term Goals: PT Short Term Goal 1: Patient will perform bed mobility with mod assist. PT Short Term Goal 1 - Progress: Progressing toward goal PT Short Term Goal 2: Pt will maintain unsupported static sitting balance in midline > 5 min with supervision. PT Short Term Goal 2 - Progress: Progressing toward goal PT Short Term Goal 3: Patient will maintain dynamic sitting balance > 5 min with min assist. PT Short Term Goal 3 - Progress: Progressing toward goal PT Short Term Goal 4: Patient will tolerate OOB in chair > 60 min for increased activity tolerance. PT Short Term Goal 4 - Progress: Progressing toward goal PT Short Term Goal 5: Patient will maintain static standing balance > 1 min with mod assist. PT Short Term Goal 5 - Progress: Progressing toward goal  Vital Signs Oxygen Therapy SpO2: 99 % O2 Device: None (Room air)  Pain Pain Assessment Pain Assessment: No/denies pain  Other Treatments Treatments Therapeutic Activity: Sit to stand x 2 with bilat UE support on sink and use of mirror as visual feedback for trunk in midline during anterior lean and max A to stand and for controlled stand to sit; static standing at sink x 2 reps with focus on use of visual and verbal cues to bring COG to L and anterior over BOS and maintain during L single limb support or during R and L UE reaching to L side; fatigued quickly but able to maintain Sp02 >90% on RA.  Transfer w/c > bed with RUE support on bed with max A squat pivot to R side with  verbal and visual cues; sit to supine with max A to bring each LE up into bed and controlled lowering of trunk to supine.    Therapy/Group: Individual Therapy  Edman Circle [facility] 12/29/2010, 4:35 PM

## 2010-12-29 NOTE — Progress Notes (Signed)
Pt has triple lumen picc line, placed on Nov 29, 2010; red port has clotted off despite TPA;  White and gray ports flush, but have no blood return, also after using TPA; suggest pulling the line if pt no longer needs iv access; also noted to have swelling in his LEFT hand and arm; pt says "it's not new, but it wasn't there before the picc line was placed;" RN in the room; aware of picc line status; please advise re the line;  Thank you!

## 2010-12-29 NOTE — Progress Notes (Addendum)
Occuaptional Therapy Note  Patient Details  Name: DEMARIO FANIEL MRN: [id number] Date of Birth: 1955/01/23 Today's Date: 12/29/2010 Time: 10:55-11:43am Pain = No pain per pt report  Pt seen this am for ADL retraining at bed level for bathing and dressing activities; weight shift and sitting balance.  ADL  Upper Body Bathing: Moderate cueing;Minimal assistance  Where Assessed-Upper Body Bathing: Bed level  Lower Body Bathing: Maximal assistance  Where Assessed-Lower Body Bathing: Bed level  Upper Body Dressing: Moderate assistance;Moderate cueing  Where Assessed-Upper Body Dressing: Edge of bed  Lower Body Dressing: Max Assist  Where Assessed-Lower Body Dressing: Bed level  Toileting: Unable to assess (incontinent prior to session); Pt wearing adult diaper; Adult diaper changed w/ mod-max assist w/ pt assisting to roll Left and Right using hand rails on bed. Peri care w/ Max/total assist. Toilet Transfer: Unable to assess  Tub/Shower Transfer: Unable to assess    Alm Bustard 12/29/2010, 11:50 AM

## 2010-12-29 NOTE — Progress Notes (Signed)
Physical Therapy Note  Patient Details  Name: [name] MRN: [id number] Date of Birth: 09-20-1955 Today's Date: 12/29/2010  1345-1425 (40 minutes) individual treatment Focus of treatment: Therapeutic activities to facilitate erect standing posture Pain- no complaint of pain Transfers: Bobath type scoot to squat/pivot (over the back) . Pt attempts unsafe stands during transfer with max posterior lean Standing: to RW X 3 mod/max assist with posterior lean; standing to bedside table with vcs to touch hips to table to decrease posterior lean.  Ruger Saxer,JIM 12/29/2010, 4:16 PM

## 2010-12-29 NOTE — Progress Notes (Signed)
Progress Notes  Speech Language Pathology Therapy Note  Patient Details  Name: [name] MRN: [id number] Date of Birth: June 14, 1955  Today's Date: 12/29/2010 Time: 0865-7846 Time Calculation (min): 48 min  Precautions: Precautions Precautions: Fall Precaution Comments: PMV with staff, posterior left lean Required Braces or Orthoses: No Restrictions Weight Bearing Restrictions: No Other Position/Activity Restrictions: monitor SpO2 and HR during therapeutic activities  Objective:  Treatment included PMSV, dysphagia, speech intelligibillty, and cognition.  Pt. Exhibited approx 75% speech intelligibility and required mod cues to demonstrate speech strategies during conversation focusing on pausing between words and deep breaths prior to expression.  Practiced donning and doffing PMV using mirror with total verbal/visual/tactile assist (requires much continued practice for pt. to achieve mod independence with this).  Pt.  Sustained "ah" for 9 seconds without wearing with fair-good vocal intensity PMV and 14 sec with PMV.  SLP wonders if pt. Can be decannulated soon?  Observed pt. With nectar thick juice with 2 delayed coughs.  Mild verbal reminders to take smaller sips and mod verbal/visual cues to accurately demonstrate chin tuck strategy.  Plan:  Continue with plan of treatment  Royce Macadamia M.Ed [employer] [phone]  12/29/2010

## 2010-12-29 NOTE — Progress Notes (Signed)
Patient ID: [name], male   DOB: 05/08/1955, 55 y.o.   MRN: [id number] Subjective/Complaints:Speech intell much improved, vocal volume much better oriented to person and place  Review of Systems  HENT: Negative for congestion.        Trach #6 cuffed  Respiratory: Negative.   Gastrointestinal: Negative for nausea, abdominal pain and diarrhea.       PEG  Genitourinary: Positive for frequency.       Condom cath  All other systems reviewed and are negative.    Objective: Vital Signs: Blood pressure 190/107, pulse 90, temperature 98.7 F (37.1 C), temperature source Oral, resp. rate 22, height 6' (1.829 m), weight 96 kg (211 lb 10.3 oz), SpO2 96.00%. No results found. Results for orders placed during the hospital encounter of 12/22/10 (from the past 72 hour(s))  GLUCOSE, CAPILLARY     Status: Normal   Collection Time   12/26/10  7:30 AM      Component Value Range Comment   Glucose-Capillary 91  70 - 99 (mg/dL)    Comment 1 Notify RN     GLUCOSE, CAPILLARY     Status: Abnormal   Collection Time   12/26/10 11:28 AM      Component Value Range Comment   Glucose-Capillary 170 (*) 70 - 99 (mg/dL)    Comment 1 Notify RN     GLUCOSE, CAPILLARY     Status: Abnormal   Collection Time   12/26/10  4:47 PM      Component Value Range Comment   Glucose-Capillary 163 (*) 70 - 99 (mg/dL)    Comment 1 Notify RN     GLUCOSE, CAPILLARY     Status: Abnormal   Collection Time   12/26/10  8:27 PM      Component Value Range Comment   Glucose-Capillary 175 (*) 70 - 99 (mg/dL)    Comment 1 Notify RN     URINE CULTURE     Status: Normal (Preliminary result)   Collection Time   12/27/10  4:56 AM      Component Value Range Comment   Specimen Description URINE, CLEAN CATCH      Special Requests NONE      Setup Time [id number]      Colony Count >=100,000 COLONIES/ML      Culture GRAM NEGATIVE RODS      Report Status PENDING     URINALYSIS, ROUTINE W REFLEX MICROSCOPIC     Status: Abnormal     Collection Time   12/27/10  4:57 AM      Component Value Range Comment   Color, Urine YELLOW  YELLOW     APPearance CLOUDY (*) CLEAR     Specific Gravity, Urine 1.007  1.005 - 1.030     pH 7.5  5.0 - 8.0     Glucose, UA NEGATIVE  NEGATIVE (mg/dL)    Hgb urine dipstick SMALL (*) NEGATIVE     Bilirubin Urine NEGATIVE  NEGATIVE     Ketones, ur NEGATIVE  NEGATIVE (mg/dL)    Protein, ur 30 (*) NEGATIVE (mg/dL)    Urobilinogen, UA 0.2  0.0 - 1.0 (mg/dL)    Nitrite POSITIVE (*) NEGATIVE     Leukocytes, UA LARGE (*) NEGATIVE    URINE MICROSCOPIC-ADD ON     Status: Abnormal   Collection Time   12/27/10  4:57 AM      Component Value Range Comment   WBC, UA TOO NUMEROUS TO COUNT  <3 (WBC/hpf)  RBC / HPF 0-2  <3 (RBC/hpf)    Bacteria, UA MANY (*) RARE    GLUCOSE, CAPILLARY     Status: Abnormal   Collection Time   12/27/10  7:47 AM      Component Value Range Comment   Glucose-Capillary 137 (*) 70 - 99 (mg/dL)    Comment 1 Notify RN     GLUCOSE, CAPILLARY     Status: Abnormal   Collection Time   12/27/10 11:00 AM      Component Value Range Comment   Glucose-Capillary 229 (*) 70 - 99 (mg/dL)    Comment 1 Notify RN     GLUCOSE, CAPILLARY     Status: Abnormal   Collection Time   12/27/10  4:23 PM      Component Value Range Comment   Glucose-Capillary 152 (*) 70 - 99 (mg/dL)    Comment 1 Notify RN     GLUCOSE, CAPILLARY     Status: Abnormal   Collection Time   12/27/10  9:58 PM      Component Value Range Comment   Glucose-Capillary 208 (*) 70 - 99 (mg/dL)   GLUCOSE, CAPILLARY     Status: Abnormal   Collection Time   12/28/10  7:32 AM      Component Value Range Comment   Glucose-Capillary 140 (*) 70 - 99 (mg/dL)    Comment 1 Notify RN     GLUCOSE, CAPILLARY     Status: Abnormal   Collection Time   12/28/10 12:04 PM      Component Value Range Comment   Glucose-Capillary 157 (*) 70 - 99 (mg/dL)    Comment 1 Notify RN     GLUCOSE, CAPILLARY     Status: Abnormal   Collection  Time   12/28/10  4:49 PM      Component Value Range Comment   Glucose-Capillary 189 (*) 70 - 99 (mg/dL)    Comment 1 Notify RN     GLUCOSE, CAPILLARY     Status: Abnormal   Collection Time   12/28/10  8:04 PM      Component Value Range Comment   Glucose-Capillary 164 (*) 70 - 99 (mg/dL)       .  Patient sitting comfortably in his chair with #6 Trach in Pl. cuffed. No visible secretions of my evaluation. Ears and throat notable for trach as well as thrush over the tongue. Patient has weak cough with trach in place. Trach site with granulation tissue at base He is fair oral motor control. No gross cranial nerve abnormalities I can see on exam today. Patient was able to follow simple one-step commands. He communicated with simple yes no and shakes. He is nonverbal. He had diminished fine motor movement in all 4 limbs. He may have had some underlying ataxia on the right posterior difficult to discern given his extensive weakness throughout. Strength was grossly3/5 proximally in both upper limbs. Lower extremity grossly3/5 proximal to3/5 distally. Patient did have sensation to gross pain stimulation. Reflexes are 1+ grossly throughout. Patient had poor insight and and awareness. Memory is difficult to assess given his language issues although it appeared to be impaired.  Heart was irregularly irregular. Chest was clear. Abdomen soft nontender. PEG site was clean and intact with no drainage. Skin throughout was generally intact. Mild edema in all 4 limbs at one plus,@+ LUE. PICC site without erythema All 4 limbs were warm with 2+ pulses.    Assessment/Plan: 1. Functional deficits secondary to R cerebellar ICH  severe R UE and R LE ataxia which require 3+ hours per day of interdisciplinary therapy in a comprehensive inpatient rehab setting.  Team conf today Physiatrist is providing close team supervision and 24 hour management of active medical problems listed below. Physiatrist and rehab team continue  to assess barriers to discharge/monitor patient progress toward functional and medical goals. Mobility: Bed Mobility Bed Mobility: Yes Rolling Right: 2: Max assist;With rail Rolling Left: 3: Mod assist Right Sidelying to Sit: With rails;2: Max assist Sitting - Scoot to Edge of Bed: 1: +1 Total assist;Other (comment) (significant posterior right lean) Transfers Sit to Stand: 1: +1 Total assist Sit to Stand Details (indicate cue type and reason): significant posterior right lean, slow to initiate movement Stand to Sit: 1: +1 Total assist Stand Pivot Transfer Details (indicate cue type and reason): poor LE eccentric control with uncontrolled descent, significant posterior right lean Squat Pivot Transfers: 1: +2 Total assist Squat Pivot Transfer Details (indicate cue type and reason): total assist + 2 (pt = 50%), poor graded movement and weight shift, slow to initiate with LEs Ambulation/Gait Stairs: No (pre-gait level at this time, unsafe to attempt) Naval architect Mobility: No  ADL:    Cognition: Cognition Overall Cognitive Status: Impaired Arousal/Alertness: Awake/alert Orientation Level: Oriented to person;Oriented to place Attention: Sustained Sustained Attention: Impaired Sustained Attention Impairment: Verbal basic;Functional basic Memory: Impaired Memory Impairment: Decreased recall of new information;Decreased long term memory Decreased Long Term Memory: Verbal basic;Functional basic Awareness: Impaired Awareness Impairment: Emergent impairment Problem Solving: Impaired Problem Solving Impairment: Verbal basic;Functional basic Executive Function: Self Monitoring;Self Correcting Self Monitoring: Impaired Self Monitoring Impairment: Functional basic Self Correcting: Impaired Self Correcting Impairment: Verbal basic;Functional basic Safety/Judgment: Appears intact Cognition Arousal/Alertness: Awake/alert Orientation Level: Oriented to person;Oriented to  place  2. Anticoagulation/DVT prophylaxis with Pharmaceutical:no anticoag use SCDs 3. Pain Management:monitor Patient Active Hospital Problem List: 4.  Afib cannot use warfarin due to ICH Diabetes mellitus (11/19/2010)    POA: Yes 5.  LUE edema, D/C PICC   Plan:reduce Lantus monitor CBG Respiratory failure (11/19/2010)    POA: Yes   Assessment: trach wean as able reduce to #6 cuffless today    Gout (11/20/2010)    POA: Yes   Assessment: inactive   Plan: monitor ICH (intracerebral hemorrhage) (12/22/2010)    POA: No   Assessment: R hemiataxia non progressive   Plan: no anticoag Low grade temp  UA positive , C and S ecoli and kleb switch to cipro  7  KIRSTEINS,ANDREW E 12/29/2010, 7:27 AM

## 2010-12-30 ENCOUNTER — Other Ambulatory Visit: Payer: Self-pay

## 2010-12-30 DIAGNOSIS — I6340 Cerebral infarction due to embolism of unspecified cerebral artery: Secondary | ICD-10-CM

## 2010-12-30 DIAGNOSIS — J9600 Acute respiratory failure, unspecified whether with hypoxia or hypercapnia: Secondary | ICD-10-CM

## 2010-12-30 DIAGNOSIS — Z5189 Encounter for other specified aftercare: Secondary | ICD-10-CM

## 2010-12-30 LAB — CBC
HCT: 27.6 % — ABNORMAL LOW (ref 39.0–52.0)
MCHC: 31.9 g/dL (ref 30.0–36.0)
MCV: 86 fL (ref 78.0–100.0)
RDW: 14.6 % (ref 11.5–15.5)

## 2010-12-30 LAB — URINE CULTURE: Colony Count: >=100000

## 2010-12-30 LAB — BASIC METABOLIC PANEL
BUN: 14 mg/dL (ref 6–23)
Calcium: 9.1 mg/dL (ref 8.4–10.5)
Chloride: 97 mEq/L (ref 96–112)
Creatinine, Ser: 0.92 mg/dL (ref 0.50–1.35)
GFR calc Af Amer: >90 mL/min (ref >90)
GFR calc Af Amer: >90 mL/min (ref >90)
GFR calc non Af Amer: >90 mL/min (ref >90)
Potassium: 2.8 mEq/L — ABNORMAL LOW (ref 3.5–5.1)

## 2010-12-30 LAB — GLUCOSE, CAPILLARY
Glucose-Capillary: 175 mg/dL — ABNORMAL HIGH (ref 70–99)
Glucose-Capillary: 125 mg/dL — ABNORMAL HIGH (ref 70–99)
Glucose-Capillary: 168 mg/dL — ABNORMAL HIGH (ref 70–99)

## 2010-12-30 MED ORDER — WHITE PETROLATUM GEL
Status: AC
  Administered 2010-12-30: 12:00:00
  Filled 2010-12-30: qty 5

## 2010-12-30 MED ORDER — POTASSIUM CHLORIDE 10 MEQ/100ML IV SOLN
10.0000 meq | INTRAVENOUS | Status: AC
  Administered 2010-12-30 (×5): 10 meq via INTRAVENOUS
  Filled 2010-12-30 (×5): qty 100

## 2010-12-30 MED ORDER — POTASSIUM CHLORIDE CRYS ER 20 MEQ PO TBCR
20.0000 meq | EXTENDED_RELEASE_TABLET | Freq: Two times a day (BID) | ORAL | Status: DC
Start: 2010-12-31 — End: 2010-12-31
  Administered 2010-12-31: 20 meq via ORAL
  Filled 2010-12-30 (×4): qty 1

## 2010-12-30 NOTE — Progress Notes (Signed)
Progress Notes  Speech Language Pathology Therapy Note  Patient Details  Name: [name] MRN: [id number] Date of Birth: March 15, 1955  Today's Date: 12/30/2010 Time: 0800-0900 Time Calculation (min): 60 min  Short Term Goals: set 12/24/10  1. Patient will demonstrate sustained attention to task for 10-12 minutes with minimal assist semantic cues  2. Patient will increase speech intelligibility at phrase level expression with moderate assist semantic cues to increase vocal intensity  3. Patient will consume dysphagia 2 (chopped) and nectar thick liquids showing no overt s/s of aspiration with the use of a chin tuck and other compensatory strategies with min assist  4. Patient will demonstrate day to day carryover of self care information with minimal assist semantic cues  5. Patient will use PMSV during all waking hours with conintuous SpO2 monitoring and saturation greater than 90% starting 12/24/10  6. Patient will don and doff PMSV with moderate assist semantic and tactile cues  Skilled Therapeutic Interventions/Progress Updates: Session focused on self feeding and safety swallowing with breakfast.  Patient consumed Dys.2 textures and Nectar-thick liquids with no overt s/s of aspiration and minimal assist semantic cues to perform a through chin tuck until the completion of the swallow;  SLP also facilitated session with minimal assist semantic cues to utilize diaphragmatic breathing to maintain SpO2 throughout meal; at times when patient was focused on scooping food or opening a container he held his breath, semantic cues to keep breathing were successful at maintaining adequate SpO2.  Pain Pain Assessment Pain Assessment: No/denies pain Pain Score: 0-No pain  Oral/Motor: Oral Motor/Sensory Function Labial ROM: Reduced left Labial Symmetry: Abnormal symmetry left;Abnormal symmetry right Labial Strength: Reduced Lingual ROM: Reduced right;Reduced left Lingual Strength: Reduced Motor  Speech Intelligibility: Intelligibility reduced Comprehension: Auditory Comprehension Yes/No Questions: Impaired Basic Immediate Environment Questions: 75-100% accurate Complex Questions: 50-74% accurate Commands: Impaired Multistep Basic Commands: 50-74% accurate Conversation: Simple Interfering Components: Anxiety;Processing speed (trach with PMSV) EffectiveTechniques: Extra processing time;Repetition;Visual/Gestural cues Visual Recognition/Discrimination Discrimination: Exceptions to [facility] Reading Comprehension Reading Status: Impaired Interfering Components: Attention;Visual scanning;Processing time;Visual acuity;Eye glasses not available Effective Techniques: Large print;Other (comment) (simple print) Expression: Expression Primary Mode of Expression: Verbal Verbal Expression Initiation: Impaired Automatic Speech: Day of week;Month of year (DOW 6/7, MOY perseverated on DOW) Level of Generative/Spontaneous Verbalization: Word Repetition: No impairment Naming: Not tested Pragmatics: Impairment Impairments: Monotone;Eye contact Interfering Components: Attention Effective Techniques: Open ended questions;Semantic cues Non-Verbal Means of Communication: Not applicable Written Expression Written Expression: Not tested  Therapy/Group: Individual Therapy  Charlane Ferretti., CCC-SLP [phone] Elidia Bonenfant 12/30/2010 4:48 PM

## 2010-12-30 NOTE — Progress Notes (Signed)
Patient ID: [name], male   DOB: [birth year], 54 y.o.   MRN: [id number] Subjective/Complaints:Speech  much improved, vocal volume much better oriented to person and place.  PICC removed yest  Review of Systems  HENT: Negative for congestion.        Trach #6 cuffed  Respiratory: Negative.   Gastrointestinal: Negative for nausea, abdominal pain and diarrhea.       PEG  Genitourinary: Negative for frequency and hematuria.       Condom cath  All other systems reviewed and are negative.    Objective: Vital Signs: Blood pressure 150/95, pulse 79, temperature 98.3 F (36.8 C), temperature source Oral, resp. rate 20, height 6' (1.829 m), weight 96.9 kg (213 lb 10 oz), SpO2 100.00%. No results found. Results for orders placed during the hospital encounter of 12/22/10 (from the past 72 hour(s))  GLUCOSE, CAPILLARY     Status: Abnormal   Collection Time   12/27/10  7:47 AM      Component Value Range Comment   Glucose-Capillary 137 (*) 70 - 99 (mg/dL)    Comment 1 Notify RN     GLUCOSE, CAPILLARY     Status: Abnormal   Collection Time   12/27/10 11:00 AM      Component Value Range Comment   Glucose-Capillary 229 (*) 70 - 99 (mg/dL)    Comment 1 Notify RN     GLUCOSE, CAPILLARY     Status: Abnormal   Collection Time   12/27/10  4:23 PM      Component Value Range Comment   Glucose-Capillary 152 (*) 70 - 99 (mg/dL)    Comment 1 Notify RN     GLUCOSE, CAPILLARY     Status: Abnormal   Collection Time   12/27/10  9:58 PM      Component Value Range Comment   Glucose-Capillary 208 (*) 70 - 99 (mg/dL)   GLUCOSE, CAPILLARY     Status: Abnormal   Collection Time   12/28/10  7:32 AM      Component Value Range Comment   Glucose-Capillary 140 (*) 70 - 99 (mg/dL)    Comment 1 Notify RN     GLUCOSE, CAPILLARY     Status: Abnormal   Collection Time   12/28/10 12:04 PM      Component Value Range Comment   Glucose-Capillary 157 (*) 70 - 99 (mg/dL)    Comment 1 Notify RN     GLUCOSE,  CAPILLARY     Status: Abnormal   Collection Time   12/28/10  4:49 PM      Component Value Range Comment   Glucose-Capillary 189 (*) 70 - 99 (mg/dL)    Comment 1 Notify RN     GLUCOSE, CAPILLARY     Status: Abnormal   Collection Time   12/28/10  8:04 PM      Component Value Range Comment   Glucose-Capillary 164 (*) 70 - 99 (mg/dL)   GLUCOSE, CAPILLARY     Status: Abnormal   Collection Time   12/29/10 11:54 AM      Component Value Range Comment   Glucose-Capillary 160 (*) 70 - 99 (mg/dL)    Comment 1 Notify RN     GLUCOSE, CAPILLARY     Status: Abnormal   Collection Time   12/29/10  5:06 PM      Component Value Range Comment   Glucose-Capillary 168 (*) 70 - 99 (mg/dL)    Comment 1 Notify RN     GLUCOSE,  CAPILLARY     Status: Abnormal   Collection Time   12/29/10  8:43 PM      Component Value Range Comment   Glucose-Capillary 148 (*) 70 - 99 (mg/dL)    Comment 1 Notify RN     CBC     Status: Abnormal   Collection Time   12/30/10  6:08 AM      Component Value Range Comment   WBC 7.4  4.0 - 10.5 (K/uL)    RBC 3.21 (*) 4.22 - 5.81 (MIL/uL)    Hemoglobin 8.8 (*) 13.0 - 17.0 (g/dL)    HCT 16.1 (*) 09.6 - 52.0 (%)    MCV 86.0  78.0 - 100.0 (fL)    MCH 27.4  26.0 - 34.0 (pg)    MCHC 31.9  30.0 - 36.0 (g/dL)    RDW 04.5  40.9 - 81.1 (%)    Platelets 398  150 - 400 (K/uL)       .  Patient sitting comfortably in his chair with #6 Trach in Pl. cuffed. No visible secretions of my evaluation. Ears and throat notable for trach as well as thrush over the tongue. Patient has weak cough with trach in place. Trach site with granulation tissue at base He is fair oral motor control. No gross cranial nerve abnormalities I can see on exam today. Patient was able to follow simple one-step commands. He communicated with simple yes no and shakes.  He had diminished fine motor movement in all 4 limbs. He may have had some underlying ataxia on the right  Strength was grossly3/5 proximally in both  upper limbs. Lower extremity grossly3/5 proximal to3/5 distally. Patient did have sensation to gross pain stimulation. Reflexes are 1+ grossly throughout. Patient had poor insight and and awareness. Heart was irregularly irregular. Chest was clear. Abdomen soft nontender. PEG site was clean and intact with no drainage. Skin throughout was generally intact. Mild edema in all 4 limbs at one plus,@+ LUE.All 4 limbs were warm with 2+ pulses.    Assessment/Plan: 1. Functional deficits secondary to R cerebellar ICH severe R UE and R LE ataxia which require 3+ hours per day of interdisciplinary therapy in a comprehensive inpatient rehab setting.  Team conf today Physiatrist is providing close team supervision and 24 hour management of active medical problems listed below. Physiatrist and rehab team continue to assess barriers to discharge/monitor patient progress toward functional and medical goals. Mobility: Bed Mobility Bed Mobility: Yes Rolling Right: 2: Max assist;With rail Rolling Left: 3: Mod assist Right Sidelying to Sit: With rails;2: Max assist Sitting - Scoot to Edge of Bed: 1: +1 Total assist;Other (comment) (significant posterior right lean) Transfers Sit to Stand: 1: +1 Total assist Sit to Stand Details (indicate cue type and reason): significant posterior right lean, slow to initiate movement Stand to Sit: 1: +1 Total assist Stand Pivot Transfer Details (indicate cue type and reason): poor LE eccentric control with uncontrolled descent, significant posterior right lean Squat Pivot Transfers: 1: +2 Total assist Squat Pivot Transfer Details (indicate cue type and reason): total assist + 2 (pt = 50%), poor graded movement and weight shift, slow to initiate with LEs Ambulation/Gait Stairs: No (pre-gait level at this time, unsafe to attempt) Wheelchair Mobility Wheelchair Mobility: No  ADL:    Cognition: Cognition Overall Cognitive Status: Impaired Arousal/Alertness:  Awake/alert Orientation Level: Oriented to person;Oriented to situation;Disoriented to situation Attention: Sustained Sustained Attention: Impaired Sustained Attention Impairment: Verbal basic;Functional basic Memory: Impaired Memory Impairment: Decreased recall  of new information;Decreased long term memory Decreased Long Term Memory: Verbal basic;Functional basic Awareness: Impaired Awareness Impairment: Emergent impairment Problem Solving: Impaired Problem Solving Impairment: Verbal basic;Functional basic Executive Function: Self Monitoring;Self Correcting Self Monitoring: Impaired Self Monitoring Impairment: Functional basic Self Correcting: Impaired Self Correcting Impairment: Verbal basic;Functional basic Safety/Judgment: Appears intact Cognition Arousal/Alertness: Awake/alert Orientation Level: Oriented to person;Oriented to situation;Disoriented to situation  2. Anticoagulation/DVT prophylaxis with Pharmaceutical:no anticoag use SCDs 3. Pain Management:monitor Patient Active Hospital Problem List: 4.  Afib cannot use warfarin due to ICH Diabetes mellitus (11/19/2010)    POA: Yes 5.  LUE edema, D/C PICC   Plan:reduce Lantus monitor CBG Respiratory failure (11/19/2010)    POA: Yes   Assessment: trach wean as able reduce to #4 cuffless today    Gout (11/20/2010)    POA: Yes   Assessment: inactive   Plan: monitor ICH (intracerebral hemorrhage) (12/22/2010)    POA: No   Assessment: R hemiataxia non progressive   Plan: no anticoag Low grade temp  UA positive , C and S ecoli and kleb switch to cipro  8  KIRSTEINS,ANDREW E 12/30/2010, 7:16 AM

## 2010-12-30 NOTE — Progress Notes (Signed)
Speech Pathology: Dysphagia Treatment Note  Group Session  1130-1230  Patient was observed with : Mechanical Soft / Ground Dys.2 and Nectar liquids.  Patient was noted to have s/s of aspiration : Yes:  Coughing x2 with meals   Lung Sounds:  WNL Temperature: WNL  Patient required: moderate semantic cues to consistently follow precautions/strategies  Clinical Impression: SLP facilitated session with semantic cues to perform chin tuck through completion of swallow; additionally, patient required moderate assist semantic cues to produce hard cough due to patient attempting to hold it in.    Recommendations:  Continue with current orders, full supervision and plan of care  Pain:   none Intervention Required:   No  Goals: Progressing  Fae Pippin, M.A., CCC-SLP [phone]

## 2010-12-30 NOTE — Progress Notes (Signed)
Nutrition Follow-up  MBSS on 12/12 recommending Dys 2 with Nectar Thickened Liquids. Per order management hx, pt was last given bolus TF on 12/15 in pm.  Pt eating mostly 75-100%. Currently ordered for 100 ml water flushes TID.  Diet Order:  Dysphagia 2 with Nectar Thickened Liquids TF Order:  Jevity 1.2 bolus of 320 cc at 8am, noon, 6pm and at bedtime. Administer if patient eats less than 50% of meal.  Meds: Scheduled Meds:   . carvedilol  25 mg Oral BID WC  . ciprofloxacin  250 mg Oral BID  . diltiazem  90 mg Oral Q6H  . feeding supplement (JEVITY 1.2)  237 mL Per Tube TID PC & HS  . free water  100 mL Per Tube Q8H  . insulin aspart  0-15 Units Subcutaneous TID WC  . insulin aspart  0-5 Units Subcutaneous QHS  . insulin glargine  15 Units Subcutaneous BID  . isosorbide-hydrALAZINE  2 tablet Oral TID  . pantoprazole sodium  40 mg Oral Q1200  . potassium chloride  10 mEq Intravenous Q1 Hr x 5  . ramipril  10 mg Oral BID  . rosuvastatin  5 mg Oral q1800   Continuous Infusions:  PRN Meds:.acetaminophen, ALPRAZolam, alum & mag hydroxide-simeth, bisacodyl, diphenhydrAMINE, food thickener, guaiFENesin-dextromethorphan, polyethylene glycol, promethazine, promethazine, promethazine, sodium chloride, traZODone, DISCONTD: chlorproMAZINE  Labs:  CMP     Component Value Date/Time   NA 139 12/30/2010 0608   K 2.3* 12/30/2010 0608   CL 98 12/30/2010 0608   CO2 30 12/30/2010 0608   GLUCOSE 139* 12/30/2010 0608   BUN 14 12/30/2010 0608   CREATININE 0.92 12/30/2010 0608   CALCIUM 9.1 12/30/2010 0608   PROT 7.1 12/23/2010 0420   ALBUMIN 2.4* 12/23/2010 0420   AST 20 12/23/2010 0420   ALT 23 12/23/2010 0420   ALKPHOS 92 12/23/2010 0420   BILITOT 0.2* 12/23/2010 0420   GFRNONAA >90 12/30/2010 0608   GFRAA >90 12/30/2010 0608     Intake/Output Summary (Last 24 hours) at 12/30/10 0926 Last data filed at 12/29/10 1900  Gross per 24 hour  Intake    240 ml  Output    550 ml  Net   -310  ml    Weight Status:  96. 9 kg, wt trending up  Nutrition Dx:  Inadequatete oral intake r/t inability to eat AEB NPO status. Resolved. New Dx: Swallowing difficulty r/t recent CVA AEB recent PEG placement and recent MBSS results  Goal:  TF to meet >90% of estimated needs. D/cd  New goal: PO's to meet >90% of estimated needs. Met.  Intervention:   1. Continue current nutrition interventions. Pt eating well at this time. Note Magic Cup supplement provided from nutritional services for additional protein and kcal on meal trays for dysphagia patients. 2. RD to continue to follow nutrition care plan.  Monitor:  TF tolerance and adequacy, weights, labs  Adair Laundry Pager #:  [phone]

## 2010-12-30 NOTE — Progress Notes (Signed)
Physical Therapy Weekly Progress Note  Patient Details  Name: [name] MRN: [id number] Date of Birth: August 12, 1955  Today's Date: 12/30/2010  Patient is a 55 y.o. Male admitted to [facility] s/p acute CVAs with complex medical stay and hospital course. Patient has made significant progress with PT this week, from total assist for all mobility at time of evaluation to current min assist bed mobility and supine-sit, mod assist transfers, and mod assist static standing balance and dynamic pre-gait activities. Patient is also tolerating more activity and more time out of bed, resulting in increase energy levels and participation in therapies. During PT treatment session today, patient's trach was plugged and he tolerated activity well on 2L O2 via nasal cannula. As mobility progresses, strong posterior right lean is present in standing positions. Patient is becoming more emergently aware of this, and is initiating self correction with mod assist and verbal cues. All short term goals have been met at this time. Patient will continue to benefit from skilled inpatient physical therapy to continue progression toward LTGs and complete family education for maximum functional gains and safe discharge home with brother at anticipated min assist level.  Patient progressing toward long term goals..  Continue plan of care.  PT Short Term Goals PT Short Term Goal 1: Patient will perform bed mobility with mod assist. PT Short Term Goal 1 - Progress: met PT Short Term Goal 2: Pt will maintain unsupported static sitting balance in midline > 5 min with supervision. PT Short Term Goal 2 - Progress: met PT Short Term Goal 3: Patient will maintain dynamic sitting balance > 5 min with min assist. PT Short Term Goal 3 - Progress: met PT Short Term Goal 4: Patient will tolerate OOB in chair > 60 min for increased activity tolerance. PT Short Term Goal 4 - Progress: met PT Short Term Goal 5: Patient will maintain static  standing balance > 1 min with mod assist. PT Short Term Goal 5 - Progress: met   [name] 12/30/2010, 5:34 PM

## 2010-12-30 NOTE — Progress Notes (Signed)
Patient transfers 2 + mod assist squat pivot for transfers between bed and wheelchair. Incontinent bowel and bladder today in brief. Trach size 6 uncuffed button plugged. Allevyn dressing to skin breakdown below trach at site. PEG tube intact, free water 100cc given. Patient tolerating 4th of 5 potassium runs well, no complaints of discomfort at IV site in R hand. Potassium 2.8 at 1500. D 2 nectar thick diet with meds whole in puree. No complaints of pain. Continue with plan of care.

## 2010-12-30 NOTE — Progress Notes (Signed)
Occupational Therapy Note  Patient Details  Name: [name] MRN: [id number] Date of Birth: [birth year] Today's Date: 12/30/2010 Time; 1130 - 1140 10 min  Pain - none  Skilled intervention: Seen in [employer] with focus on postural control and functional use LUE, sustained attention, problem solving and managing safe swallow techniques. Mod vc for swallowing techniques and for use L hand as stabilizer. Discussed goals of [employer] with pt.  Group session  [name],[name] 12/30/2010, 4:27 PM

## 2010-12-30 NOTE — Progress Notes (Signed)
Occupational Therapy Weekly Progress Note  Patient Details  Name: [name] MRN: [id number] Date of Birth: 06-Apr-1955  Today's Date: 12/30/2010 Time: 1005-1115 70 min   Patient has met 1 of 4 short term goals, however is making excellent progress toward functional improvements due to improved cardiovascular endurance, improved activity tolerance, improved postural control, improved sustained attention, improved engagement in task, improved ability to utilize compensatory strategies with basic self care and functional mobility tasks.      Patient continues to demonstrate the following deficits:decreased activity tolerance, decreased coordinated movement in left UE/LE, decreased postural control, and significant visual disruption and therefore will continue to benefit from skilled OT intervention to enhance overall performance with ADL.  Patient progressing toward long term goals..  Continue plan of care.  OT Short Term Goals OT Short Term Goal 1: Pt will complete bathing with mod assist in seated position OT Short Term Goal 1 - Progress: Progressing toward goal OT Short Term Goal 2: Pt will complete UB dressing with min assist  OT Short Term Goal 2 - Progress: Progressing toward goal OT Short Term Goal 3: Pt will complete LB dressing with max assist in sit to stand position OT Short Term Goal 3 - Progress: Progressing toward goal OT Short Term Goal 4: Pt will demonstrate improved unsupported sitting balance for 10 mins to complete UB bathing and dressing OT Short Term Goal 4 - Progress: Met  Therapy Documentation Precautions: Precautions Precautions: Fall;Other (comment) Precaution Comments: O2 @2l  via nasal cannula with Passey muir valve on during the day.  Monitor O2 during activity Required Braces or Orthoses: No Restrictions Weight Bearing Restrictions: No Other Position/Activity Restrictions: monitor O2 and HR during therapeutic activities  Skilled Therapeutic  Interventions/Progress Updates:  Patient seen for OT bathing and dressing this am to continue to address postural control, functional use of upper extremities in bilateral and bimanual tasks, and activity tolerance.  Patient continues to report both blurred vision, and diplopia.  Patient issued an eye patch today to reduce double vision during tasks which require visual attention.  Patient reports eye patch "helps but feels weird."  Patient instructed to remove eye patch as needed, and discussed alternate eye schedule.  Will continue to monitor. Balance/vestibular training;Cognitive Engineer, maintenance;Functional mobility training;Neuromuscular re-education;Patient/family education;Self Care/advanced ADL retraining;Therapeutic Exercise;Therapeutic Activities;UE/LE Strength taining/ROM;UE/LE Coordination activities;Visual/perceptual remediation/compensation   General General Chart Reviewed: Yes  Pain Pain Assessment Pain Assessment: No/denies pain (But reports difficulty sleeping upright, neck)      Therapy/Group: Individual Therapy  [name] 12/30/2010, 12:40 PM

## 2010-12-30 NOTE — Progress Notes (Signed)
Physical Therapy Session Note  Patient Details  Name: [name] MRN: [id number] Date of Birth: [birth year]  Today's Date: 12/30/2010 Time: 1412-1500 Time Calculation (min): 47 min  Precautions: Precautions Precautions: Fall Precaution Comments: 2L O2 via nasal cannula with trach plug, monitor O2 during activity Required Braces or Orthoses: No Restrictions Weight Bearing Restrictions: No Other Position/Activity Restrictions: extensor thrust during mobility  Short Term Goals: PT Short Term Goal 1: Patient will perform bed mobility with mod assist. PT Short Term Goal 1 - Progress: Progressing toward goal PT Short Term Goal 2: Pt will maintain unsupported static sitting balance in midline > 5 min with supervision. PT Short Term Goal 2 - Progress: Progressing toward goal PT Short Term Goal 3: Patient will maintain dynamic sitting balance > 5 min with min assist. PT Short Term Goal 3 - Progress: Progressing toward goal PT Short Term Goal 4: Patient will tolerate OOB in chair > 60 min for increased activity tolerance. PT Short Term Goal 4 - Progress: Progressing toward goal PT Short Term Goal 5: Patient will maintain static standing balance > 1 min with mod assist. PT Short Term Goal 5 - Progress: Progressing toward goal  Skilled Therapeutic Interventions/Progress Updates:     General Chart Reviewed: Yes Family/Caregiver Present: No  Vital Signs Therapy Vitals Pulse Rate: 64-88 bpm throughout therapy session  Oxygen Therapy SpO2: 99 % O2 Device: Nasal cannula O2 Flow Rate (L/min): 2 L/min Trach plug applied just prior to session by RN  Pain Pain Assessment Pain Assessment: No/denies pain Pain Score: 0-No pain  Other Treatments  Bed mobility and supine-sit min assist, manual facilitation at pelvis for weight shift. Squat-pivot transfers bed <-> W/C mod assist, focus  on staying forward to prevent extensor thrust. Sit-stand x 1 with Carley Hammed walker mod-mas assist, manual  facilitation for trunk alignment and anterior weight shift, patient with strong posterior right lean. In standing, patient stated needing to use the bathroom. Transported to room and stood with urinal with mod assist, mod assist to steady while patient donned and doffed pants. Sit-stand trials in room x 3 with manual facilitation for anterior weight shift to maintain midline, progressing to lateral weight shifts as pre-gait activities with mod-max assist.  Therapy/Group: Individual Therapy  [name] 12/30/2010, 3:05 PM

## 2010-12-31 LAB — GLUCOSE, CAPILLARY
Glucose-Capillary: 157 mg/dL — ABNORMAL HIGH (ref 70–99)
Glucose-Capillary: 172 mg/dL — ABNORMAL HIGH (ref 70–99)
Glucose-Capillary: 168 mg/dL — ABNORMAL HIGH (ref 70–99)

## 2010-12-31 LAB — BASIC METABOLIC PANEL
CO2: 31 mEq/L (ref 19–32)
Calcium: 9.8 mg/dL (ref 8.4–10.5)
Chloride: 96 mEq/L (ref 96–112)
GFR calc Af Amer: >90 mL/min (ref >90)
Sodium: 138 mEq/L (ref 135–145)

## 2010-12-31 MED ORDER — POTASSIUM CHLORIDE CRYS ER 20 MEQ PO TBCR
20.0000 meq | EXTENDED_RELEASE_TABLET | Freq: Three times a day (TID) | ORAL | Status: DC
  Administered 2010-12-31: 20 meq via ORAL
  Filled 2010-12-31 (×5): qty 1

## 2010-12-31 NOTE — Progress Notes (Signed)
Patient ID: [name], male   DOB: 16-May-1955, 55 y.o.   MRN: [id number] Subjective/Complaints:Speech  much improved, vocal volume much better oriented to person and place.  NSG reports diarrhea.  Pt states he was up several times last noc for various reasons.  Review of Systems  HENT: Negative for congestion.        Trach #6 cuffed  Respiratory: Negative.   Gastrointestinal: Positive for diarrhea. Negative for nausea and abdominal pain.       PEG  Genitourinary: Negative for frequency and hematuria.       Condom cath  All other systems reviewed and are negative.    Objective: Vital Signs: Blood pressure 151/85, pulse 80, temperature 98.3 F (36.8 C), temperature source Oral, resp. rate 18, height 6' (1.829 m), weight 96.5 kg (212 lb 11.9 oz), SpO2 100.00%. No results found. Results for orders placed during the hospital encounter of 12/22/10 (from the past 72 hour(s))  GLUCOSE, CAPILLARY     Status: Abnormal   Collection Time   12/28/10  7:32 AM      Component Value Range Comment   Glucose-Capillary 140 (*) 70 - 99 (mg/dL)    Comment 1 Notify RN     GLUCOSE, CAPILLARY     Status: Abnormal   Collection Time   12/28/10 12:04 PM      Component Value Range Comment   Glucose-Capillary 157 (*) 70 - 99 (mg/dL)    Comment 1 Notify RN     GLUCOSE, CAPILLARY     Status: Abnormal   Collection Time   12/28/10  4:49 PM      Component Value Range Comment   Glucose-Capillary 189 (*) 70 - 99 (mg/dL)    Comment 1 Notify RN     GLUCOSE, CAPILLARY     Status: Abnormal   Collection Time   12/28/10  8:04 PM      Component Value Range Comment   Glucose-Capillary 164 (*) 70 - 99 (mg/dL)   GLUCOSE, CAPILLARY     Status: Abnormal   Collection Time   12/29/10 11:54 AM      Component Value Range Comment   Glucose-Capillary 160 (*) 70 - 99 (mg/dL)    Comment 1 Notify RN     GLUCOSE, CAPILLARY     Status: Abnormal   Collection Time   12/29/10  5:06 PM      Component Value Range Comment   Glucose-Capillary 168 (*) 70 - 99 (mg/dL)    Comment 1 Notify RN     GLUCOSE, CAPILLARY     Status: Abnormal   Collection Time   12/29/10  8:43 PM      Component Value Range Comment   Glucose-Capillary 148 (*) 70 - 99 (mg/dL)    Comment 1 Notify RN     CBC     Status: Abnormal   Collection Time   12/30/10  6:08 AM      Component Value Range Comment   WBC 7.4  4.0 - 10.5 (K/uL)    RBC 3.21 (*) 4.22 - 5.81 (MIL/uL)    Hemoglobin 8.8 (*) 13.0 - 17.0 (g/dL)    HCT 78.2 (*) 95.6 - 52.0 (%)    MCV 86.0  78.0 - 100.0 (fL)    MCH 27.4  26.0 - 34.0 (pg)    MCHC 31.9  30.0 - 36.0 (g/dL)    RDW 21.3  08.6 - 57.8 (%)    Platelets 398  150 - 400 (K/uL)   BASIC  METABOLIC PANEL     Status: Abnormal   Collection Time   12/30/10  6:08 AM      Component Value Range Comment   Sodium 139  135 - 145 (mEq/L)    Potassium 2.3 (*) 3.5 - 5.1 (mEq/L)    Chloride 98  96 - 112 (mEq/L)    CO2 30  19 - 32 (mEq/L)    Glucose, Bld 139 (*) 70 - 99 (mg/dL)    BUN 14  6 - 23 (mg/dL)    Creatinine, Ser 9.14  0.50 - 1.35 (mg/dL)    Calcium 9.1  8.4 - 10.5 (mg/dL)    GFR calc non Af Amer >90  >90 (mL/min)    GFR calc Af Amer >90  >90 (mL/min)   GLUCOSE, CAPILLARY     Status: Abnormal   Collection Time   12/30/10  7:14 AM      Component Value Range Comment   Glucose-Capillary 148 (*) 70 - 99 (mg/dL)    Comment 1 Notify RN     GLUCOSE, CAPILLARY     Status: Abnormal   Collection Time   12/30/10 11:30 AM      Component Value Range Comment   Glucose-Capillary 175 (*) 70 - 99 (mg/dL)    Comment 1 Notify RN     BASIC METABOLIC PANEL     Status: Abnormal   Collection Time   12/30/10  2:05 PM      Component Value Range Comment   Sodium 136  135 - 145 (mEq/L)    Potassium 2.8 (*) 3.5 - 5.1 (mEq/L)    Chloride 97  96 - 112 (mEq/L)    CO2 28  19 - 32 (mEq/L)    Glucose, Bld 186 (*) 70 - 99 (mg/dL)    BUN 15  6 - 23 (mg/dL)    Creatinine, Ser 7.82  0.50 - 1.35 (mg/dL)    Calcium 9.1  8.4 - 10.5 (mg/dL)     GFR calc non Af Amer 79 (*) >90 (mL/min)    GFR calc Af Amer >90  >90 (mL/min)   GLUCOSE, CAPILLARY     Status: Abnormal   Collection Time   12/30/10  5:21 PM      Component Value Range Comment   Glucose-Capillary 125 (*) 70 - 99 (mg/dL)    Comment 1 Notify RN     GLUCOSE, CAPILLARY     Status: Abnormal   Collection Time   12/30/10  8:19 PM      Component Value Range Comment   Glucose-Capillary 145 (*) 70 - 99 (mg/dL)    Comment 1 Notify RN     GLUCOSE, CAPILLARY     Status: Abnormal   Collection Time   12/30/10 10:06 PM      Component Value Range Comment   Glucose-Capillary 168 (*) 70 - 99 (mg/dL)    Comment 1 Notify RN         .  Patient sitting comfortably in his chair with #6 Trach in Pl. cuffed. No visible secretions of my evaluation. Ears and throat notable for trach as well as thrush over the tongue. Patient has weak cough with trach in place. Trach site with granulation tissue at base He is fair oral motor control. No gross cranial nerve abnormalities I can see on exam today. Patient was able to follow simple one-step commands. He communicated with simple yes no and shakes.  He had diminished fine motor movement in all 4 limbs. He may have  had some underlying ataxia on the right  Strength was grossly3/5 proximally in both upper limbs. Lower extremity grossly3/5 proximal to3/5 distally. Patient did have sensation to gross pain stimulation. Reflexes are 1+ grossly throughout. Patient had poor insight and and awareness. Heart was irregularly irregular. Chest was clear. Abdomen soft nontender. PEG site was clean and intact with no drainage. Skin throughout was generally intact. Mild edema in all 4 limbs at one plus,@+ LUE.All 4 limbs were warm with 2+ pulses.    Assessment/Plan: 1. Functional deficits secondary to R cerebellar ICH severe R UE and R LE ataxia which require 3+ hours per day of interdisciplinary therapy in a comprehensive inpatient rehab setting.  Team conf  today Physiatrist is providing close team supervision and 24 hour management of active medical problems listed below. Physiatrist and rehab team continue to assess barriers to discharge/monitor patient progress toward functional and medical goals. Mobility: Bed Mobility Bed Mobility: Yes Rolling Right: 2: Max assist;With rail Rolling Left: 3: Mod assist Right Sidelying to Sit: With rails;2: Max assist Sitting - Scoot to Edge of Bed: 1: +1 Total assist;Other (comment) (significant posterior right lean) Transfers Sit to Stand: 1: +1 Total assist Sit to Stand Details (indicate cue type and reason): significant posterior right lean, slow to initiate movement Stand to Sit: 1: +1 Total assist Stand Pivot Transfer Details (indicate cue type and reason): poor LE eccentric control with uncontrolled descent, significant posterior right lean Squat Pivot Transfers: 1: +2 Total assist Squat Pivot Transfer Details (indicate cue type and reason): total assist + 2 (pt = 50%), poor graded movement and weight shift, slow to initiate with LEs Ambulation/Gait Stairs: No (pre-gait level at this time, unsafe to attempt) Wheelchair Mobility Wheelchair Mobility: No  ADL:    Cognition: Cognition Overall Cognitive Status: Impaired Arousal/Alertness: Awake/alert Orientation Level: Oriented to person;Oriented to place;Disoriented to time Attention: Sustained Sustained Attention: Impaired Sustained Attention Impairment: Functional basic Memory: Impaired Memory Impairment: Decreased recall of new information;Decreased long term memory Decreased Long Term Memory: Verbal basic;Functional basic Awareness: Impaired Awareness Impairment: Emergent impairment Problem Solving: Impaired Problem Solving Impairment: Functional basic Executive Function: Self Monitoring;Self Correcting Self Monitoring: Impaired Self Monitoring Impairment: Functional basic Self Correcting: Impaired Self Correcting Impairment: Verbal  basic;Functional basic Safety/Judgment: Appears intact Cognition Arousal/Alertness: Awake/alert Orientation Level: Oriented to person;Oriented to place;Disoriented to time  2. Anticoagulation/DVT prophylaxis with Pharmaceutical:no anticoag use SCDs 3. Pain Management:monitor Patient Active Hospital Problem List: 4.  Afib cannot use warfarin due to ICH Diabetes mellitus (11/19/2010)    POA: Yes 5.  LUE edema, D/C PICC 6.   DM Lantus monitor CBG 7. Respiratory failure (11/19/2010)    Assessment: trach wean as able reduce to #4 cuffless today    8.Gout (11/20/2010)     9. UTI cipro through sat 10.  Loose stools likely due to Cipro no watery stools, no other discomfort, good po intake 11.  Hypo K may be realted to frequent stooling recheck BMET, oral supplements 9  [name] E 12/31/2010, 7:29 AM

## 2010-12-31 NOTE — Progress Notes (Signed)
CRITICAL VALUE ALERT  Critical value received:  Potassium 2.3  Date of notification:  12/30/2010  Time of notification:  0800  Critical value read back:yes  Nurse who received alert:  Hedy Camara  MD notified (1st page):  Marissa Nestle, PA alerted 0800  Time of first page:  0800  MD notified (2nd page):  Time of second page:  Responding MD: Marissa Nestle, PA  Time MD responded: [phone]

## 2010-12-31 NOTE — Progress Notes (Signed)
Speech Pathology: Dysphagia Treatment Note  Group Session  Time: 1130-1200  Patient was observed with : Dys. 2 Ground and Nectar liquids.  Patient was noted to have s/s of aspiration : No:    Lung Sounds:  WNL Temperature: WNL  Patient required: minimal cues to consistently follow precautions/strategies  Clinical Impression: SLP facilitated session with minimal assist semantic cues to perform/carryover safe swallow strategies with initial timing cues (sip, tuck, swallow)  With increased carryover and less cuing as session went on.  Patient also with improved selective attention as compared to yesterday's session.    Recommendations:  Continue with current orders and plan of care  Pain:   none Intervention Required:   No  Goals: Progressing   Fae Pippin, M.A., CCC-SLP [phone]

## 2010-12-31 NOTE — Progress Notes (Signed)
RT attempted to change trach at this time, and couldn't get the new trach back in. It appeared to be in, but there was no color change on ETCO2. I called supervisor and he confirmed it was not in. MD aware, and he said to leave the trach out. Cleaned stoma and covered with gauze. RT will continue to monitor.

## 2010-12-31 NOTE — Progress Notes (Addendum)
Patient transfers 2+ mod assist stand pivot between wheelchair and bed for nursing. Continent BM in bedpan this am. Incontinent of urine in brief. Tracheostomy removed today at 1315 by respiratory therapy. Patient continues to tolerate O2 2L [place] at all times-sats 99-100. No complaints of pain today. Continue with plan of care.

## 2010-12-31 NOTE — Progress Notes (Signed)
Per State Regulation 482.30 This chart was reviewed for medical necessity with respect to the patient's Admission/Duration of stay. Team Conference today -- see notes.  Met with pt and family to discuss team conference. Pt and family in agreement with goals and d/c date of 01/18/10.  [name]                 Nurse Care Manager             Next Review Date: 01/05/11

## 2010-12-31 NOTE — Progress Notes (Signed)
Occupational Therapy Note  Patient Details  Name: [name] MRN: [id number] Date of Birth: [birth year] Today's Date: 12/31/2010 [id number] 60 min Individual treatment Pain:  Denies pain Skilled clinical intervention:  Patient seen this am for bathing at dressing at sink level this am.  Patient on bedpan upon arrival, and per NT patient indicated need to void, and then had continent void on bedpan.  Discussed with patient and NT, that next goal is to transfer to drop arm commode, as now sitting balance is sufficient for such a trial.  Focus today on transitional movements required with simple sponge bath and dressing tasks, e.g. Reaching forward to feet, sit to and from stand to adjust clothing.  Patient now able to attend to verbal instruction to address posterior bias in standing.  With bilateral upper extremity support, patient able to maintain static stand for 10-15 seconds, with minimal assist, and even momentary close supervision.  However, as soon as upper extremities leave support position, strong posterior bias returns.   Visual exercises to reduce diplopia.  Patient able to identify area in right lower quadrant where he is able to converge visual image.  Worked with patient to begin to expand area of focus within right lower quadrant.  Continue to patch alternate eyes to reduce diplopia during functional activities.      [name] 12/31/2010, 9:21 AM

## 2010-12-31 NOTE — Patient Care Conference (Signed)
Inpatient RehabilitationTeam Conference Note Date: 12/31/2010   Time: 3:45 PM    Patient Name: [name]      Medical Record Number: [id number]  Date of Birth: August 06, 1955 Sex: Male         Room/Bed: 4030/4030-01 Payor Info: Payor: Advertising copywriter  Plan: [employer]  Product Type: *No Product type*     Admitting Diagnosis: L CEREBELLAR CVA  Admit Date/Time:  12/22/2010  4:53 PM Admission Comments: No comment available   Primary Diagnosis:  <principal problem not specified> Principal Problem: <principal problem not specified>  Patient Active Problem List  Diagnoses Date Noted  . ICH (intracerebral hemorrhage) 12/22/2010  . Hypernatremia 12/22/2010  . Physical deconditioning 12/22/2010  . Hyperlipemia 11/21/2010  . Gout 11/20/2010  . CAD (coronary artery disease) 11/20/2010  . Stroke 11/19/2010  . Diabetes mellitus 11/19/2010  . Respiratory failure 11/19/2010  . A-fib 11/19/2010    Expected Discharge Date: Expected Discharge Date: 01/19/11  Team Members Present: Physician: Dr. Claudette Laws Case Manager Present: Lutricia Horsfall, RN Social Worker Present: Dossie Der, LCSW PT Present: Edman Circle, Daneen Schick, PT OT Present: Bretta Bang, OT;Hilary Ward, OT SLP Present: Fae Pippin, SLP RN Present: Laural Roes    Current Status/Progress Goal Weekly Team Focus  Medical   loose stools, may be causing hypo K, has UTI  finish abx, supplement K  D/C trach   Bowel/Bladder   incontinent bowel and bladder. lbm 12/18 incontinent. wears brief  continent bowel bladder min assist  scheduled toileting to encourage continence   Swallow/Nutrition/ Hydration   Dys.2 Nectar-thick liquids with chin tuck with moderate assist   least restrictive p.o. intake with supervision  trials of Dys.3; repeat MBSS end of this week or beginning of next week   ADL's   mod assist upper body, max assit lower body.  Significant improvement in activity tolerance and postural  control.  min assist overall with ADL  Continue to improve activity tolerance. Work with nursing on toileting program.   Mobility   mod-max assist squat-pivot transfers, mod-max assist standing pre-gait activities  min assist transfers, supervision W/C mobility, min assist short distance gait  out of bed tolerance, decreased assist transfers   Communication   Trach plugged with intermittent SpO2 monitoring   supervision-minimal assist  increase initiation of verbal responses   Safety/Cognition/ Behavioral Observations  moderate assist  supervision  increase self monitoring and carryover   Pain   denies pain  < 3  continue to assess for pain   Skin   no skin issues  no new breakdown  maintain skin integrity      *See Interdisciplinary Assessment and Plan and progress notes for long and short-term goals  Barriers to Discharge: Still with trach, G tube and severe Ataxia    Possible Resolutions to Barriers:  See above    Discharge Planning/Teaching Needs:  Home with family and brother providing majority of care.  Brother here daily observing.      Team Discussion: Plan to downsize trach-was actually plugged yesterday & tolerated. Working to improve continence.  Should be able to remove PEG before d/c-not using anymore.  MBS soon.  Need family ed.   Revisions to Treatment Plan: none    Continued Need for Acute Rehabilitation Level of Care: The patient requires daily medical management by a physician with specialized training in physical medicine and rehabilitation for the following conditions: Daily direction of a multidisciplinary physical rehabilitation program to ensure safe treatment while eliciting the highest outcome  that is of practical value to the patient.: Yes Daily medical management of patient stability for increased activity during participation in an intensive rehabilitation regime.: Yes Daily analysis of laboratory values and/or radiology reports with any subsequent need  for medication adjustment of medical intervention for : Neurological problems;Pulmonary problems  Brock Ra 12/31/2010, 3:45 PM

## 2010-12-31 NOTE — Progress Notes (Signed)
Progress Notes  Speech Language Pathology Therapy Note  Patient Details  Name: [name] MRN: [id number] Date of Birth: [birth year]  Short Term Goals: set 12/24/10  1. Patient will demonstrate sustained attention to task for 10-12 minutes with minimal assist semantic cues  2. Patient will increase speech intelligibility at phrase level expression with moderate assist semantic cues to increase vocal intensity  3. Patient will consume dysphagia 2 (chopped) and nectar thick liquids showing no overt s/s of aspiration with the use of a chin tuck and other compensatory strategies with min assist  4. Patient will demonstrate day to day carryover of self care information with minimal assist semantic cues  5. Patient will use PMSV during all waking hours with conintuous SpO2 monitoring and saturation greater than 90% starting 12/24/10  6. Patient will don and doff PMSV with moderate assist semantic and tactile cues  Today's Date: 12/31/2010 Time: 1315-1330 Time Calculation (min): 15 min Individual treatment Skilled Therapeutic Interventions/Progress Updates: Patient with trach removal prior to session with O2 nasal canula at 2 liters and intermittent SpO2 between 97-100 throughout session; with moderate assist semantic cues to recall strategies to use with stoma to facilitate increased vocal intensity and a strong cough.    Time: 1400-1430 Time Calculation (min): 30 min Individual treatment Skilled Therapeutic Interventions/Progress Updates: Session focused on increased vocal intensity with nosey environment with moderate assist semantic cues; patient with trials without nasal canula with intermittent SpO2 monitoring and saturation between 94-98  Time: 1430-1500 Time Calculation (min): 30 min Individual treatment Skilled Therapeutic Interventions/Progress Updates: Session focused on trials of Dys.3 textures and Nectar-thick liquids with slightly prolonged mastication and moderate assist  semantic cues to utilize chin tuck in a distracting environment as an air way protections strategy.  No overt s/s of aspiration and will continue with trials 01/01/11  Pain Pain Assessment Pain Assessment: No/denies pain Pain Score: 0-No pain  Oral/Motor: Oral Motor/Sensory Function Labial ROM: Reduced left Labial Symmetry: Abnormal symmetry left;Abnormal symmetry right Labial Strength: Reduced Lingual ROM: Reduced right;Reduced left Lingual Strength: Reduced Motor Speech Intelligibility: Intelligibility reduced Comprehension: Auditory Comprehension Yes/No Questions: Impaired Basic Immediate Environment Questions: 75-100% accurate Complex Questions: 50-74% accurate Commands: Impaired Multistep Basic Commands: 50-74% accurate Conversation: Simple Interfering Components: Anxiety;Processing speed (trach with PMSV) EffectiveTechniques: Extra processing time;Repetition;Visual/Gestural cues Visual Recognition/Discrimination Discrimination: Exceptions to [facility] Reading Comprehension Reading Status: Impaired Interfering Components: Attention;Visual scanning;Processing time;Visual acuity;Eye glasses not available Effective Techniques: Large print;Other (comment) (simple print) Expression: Expression Primary Mode of Expression: Verbal Verbal Expression Initiation: Impaired Automatic Speech: Day of week;Month of year (DOW 6/7, MOY perseverated on DOW) Level of Generative/Spontaneous Verbalization: Word Repetition: No impairment Naming: Not tested Pragmatics: Impairment Impairments: Monotone;Eye contact Interfering Components: Attention Effective Techniques: Open ended questions;Semantic cues Non-Verbal Means of Communication: Not applicable Written Expression Written Expression: Not tested  Therapy/Group: Individual Therapy  Charlane Ferretti., CCC-SLP [phone] Sabria Florido 12/31/2010 3:36 PM

## 2010-12-31 NOTE — Progress Notes (Signed)
Physical Therapy Session Note  Patient Details  Name: [name] MRN: [id number] Date of Birth: November 18, 1955  Today's Date: 12/31/2010 Time: 1330-1400 Time Calculation (min): 30 min  Precautions: Precautions Precautions: Fall Precaution Comments: decannulated, monitor O2 during activity Required Braces or Orthoses: No Restrictions Weight Bearing Restrictions: No Other Position/Activity Restrictions: extensor thrust during mobility  Short Term Goals: PT Short Term Goal 1: Patient will consistently perform bed mobility and supine-sit with min assist. PT Short Term Goal 1 - Progress: Progressing toward goal PT Short Term Goal 2: Patient will consistently transfer with mod assist. PT Short Term Goal 2 - Progress: Progressing toward goal PT Short Term Goal 3: Patient will maintain dynamic sitting balance > 10 min with min assist. PT Short Term Goal 3 - Progress: Progressing toward goal PT Short Term Goal 4: Patient will gait 15 feet with max assist. PT Short Term Goal 4 - Progress: Progressing toward goal PT Short Term Goal 5: Patient will propel W/C > 50 feet with min assist. PT Short Term Goal 5 - Progress: Progressing toward goal  Skilled Therapeutic Interventions/Progress Updates:     General Chart Reviewed: Yes Family/Caregiver Present: Yes (brother Molly Maduro)  Vital Signs Therapy Vitals Oxygen Therapy SpO2: 98-100 % throughout session O2 Device: room air  Pain Pain Assessment Pain Assessment: No/denies pain Pain Score: 0-No pain  Other Treatments  Patient de cannulated this afternoon, O2 100% on 2L O2 during bed mobility and supine to sit, removed O2 for room air trials. O2 sat 98-100% on room air throughout remainer of session. Bed mobility and supine <-> sit min assist, improved trunk rotation and weight shift noted today. Pre-gait anterior and lateral weight shifts with total assist + 2 with manual facilitation for alignment and sustained midline stance. Gait with  total assist + 2, patient = < 25%, x 12 feet and 10 feet, manual facilitation for anterior and lateral weight shifts and verbal and visual cues for increased BOS and foot placement. Bilat scissoring resulting in extremely narrow BOS and posterior lean.   Therapy/Group: Individual Therapy  [name] 12/31/2010, 4:09 PM

## 2010-12-31 NOTE — Progress Notes (Signed)
OccupationalTherapy Note  Patient Details  Name: [name] MRN: [id number] Date of Birth: Oct 02, 1955 Today's Date: 12/31/2010 Time: 12pm-12:15pm (13:00-13:15)  Pain - none   Skilled intervention:  Seen in [employer] with focus on postural control and functional use RUE & LUE, sustained attention, problem solving and managing safe swallow techniques. Mod vc for swallowing techniques. Pt utilized 1# wt Right wrist and weighted utensils secondary to apraxia w/ positive results & good pt participation. Reviewed goals of [employer] with pt.  Group Therapy/[employer]   [name], [name] Dionicio Stall 12/31/2010, 12:42 PM

## 2011-01-01 DIAGNOSIS — I69993 Ataxia following unspecified cerebrovascular disease: Secondary | ICD-10-CM

## 2011-01-01 LAB — GLUCOSE, CAPILLARY: Glucose-Capillary: 136 mg/dL — ABNORMAL HIGH (ref 70–99)

## 2011-01-01 MED ORDER — BACLOFEN 5 MG HALF TABLET
5.0000 mg | ORAL_TABLET | Freq: Two times a day (BID) | ORAL | Status: DC
  Administered 2011-01-01 – 2011-01-16 (×30): 5 mg via ORAL
  Filled 2011-01-01 (×35): qty 1

## 2011-01-01 MED ORDER — POTASSIUM CHLORIDE CRYS ER 20 MEQ PO TBCR
40.0000 meq | EXTENDED_RELEASE_TABLET | Freq: Two times a day (BID) | ORAL | Status: DC
  Administered 2011-01-01 – 2011-01-05 (×9): 40 meq via ORAL
  Filled 2011-01-01 (×11): qty 2

## 2011-01-01 MED ORDER — PANTOPRAZOLE SODIUM 40 MG PO TBEC
40.0000 mg | DELAYED_RELEASE_TABLET | Freq: Every day | ORAL | Status: DC
  Administered 2011-01-02 – 2011-01-19 (×18): 40 mg via ORAL
  Filled 2011-01-01 (×20): qty 1

## 2011-01-01 MED ORDER — DILTIAZEM HCL 90 MG PO TABS
90.0000 mg | ORAL_TABLET | Freq: Four times a day (QID) | ORAL | Status: DC
  Administered 2011-01-01 – 2011-01-19 (×71): 90 mg via ORAL
  Filled 2011-01-01 (×75): qty 1

## 2011-01-01 MED ORDER — SILVER NITRATE-POT NITRATE 75-25 % EX MISC
2.0000 | CUTANEOUS | Status: DC | PRN
  Filled 2011-01-01: qty 2

## 2011-01-01 MED ORDER — SIMETHICONE 80 MG PO CHEW
80.0000 mg | CHEWABLE_TABLET | Freq: Four times a day (QID) | ORAL | Status: DC
  Administered 2011-01-01 – 2011-01-19 (×42): 80 mg via ORAL
  Filled 2011-01-01 (×76): qty 1

## 2011-01-01 NOTE — Progress Notes (Signed)
Patient ID: [name], male   DOB: 18-Nov-1955, 55 y.o.   MRN: [id number] Subjective/Complaints:Speech  much improved, vocal volume much better oriented to person and place.  NSG reports diarrhea.  Pt states he was up several times last noc for various reasons.  Review of Systems  HENT: Negative for congestion.        Trach removed  Respiratory: Negative.   Gastrointestinal: Positive for diarrhea. Negative for nausea and abdominal pain.       PEG  Genitourinary: Negative for frequency and hematuria.       Condom cath  All other systems reviewed and are negative.    Objective: Vital Signs: Blood pressure 156/95, pulse 82, temperature 97.6 F (36.4 C), temperature source Oral, resp. rate 17, height 6' (1.829 m), weight 96.6 kg (212 lb 15.4 oz), SpO2 94.00%. No results found. Results for orders placed during the hospital encounter of 12/22/10 (from the past 72 hour(s))  GLUCOSE, CAPILLARY     Status: Abnormal   Collection Time   12/29/10 11:54 AM      Component Value Range Comment   Glucose-Capillary 160 (*) 70 - 99 (mg/dL)    Comment 1 Notify RN     GLUCOSE, CAPILLARY     Status: Abnormal   Collection Time   12/29/10  5:06 PM      Component Value Range Comment   Glucose-Capillary 168 (*) 70 - 99 (mg/dL)    Comment 1 Notify RN     GLUCOSE, CAPILLARY     Status: Abnormal   Collection Time   12/29/10  8:43 PM      Component Value Range Comment   Glucose-Capillary 148 (*) 70 - 99 (mg/dL)    Comment 1 Notify RN     CBC     Status: Abnormal   Collection Time   12/30/10  6:08 AM      Component Value Range Comment   WBC 7.4  4.0 - 10.5 (K/uL)    RBC 3.21 (*) 4.22 - 5.81 (MIL/uL)    Hemoglobin 8.8 (*) 13.0 - 17.0 (g/dL)    HCT 64.4 (*) 03.4 - 52.0 (%)    MCV 86.0  78.0 - 100.0 (fL)    MCH 27.4  26.0 - 34.0 (pg)    MCHC 31.9  30.0 - 36.0 (g/dL)    RDW 74.2  59.5 - 63.8 (%)    Platelets 398  150 - 400 (K/uL)   BASIC METABOLIC PANEL     Status: Abnormal   Collection Time   12/30/10  6:08 AM      Component Value Range Comment   Sodium 139  135 - 145 (mEq/L)    Potassium 2.3 (*) 3.5 - 5.1 (mEq/L)    Chloride 98  96 - 112 (mEq/L)    CO2 30  19 - 32 (mEq/L)    Glucose, Bld 139 (*) 70 - 99 (mg/dL)    BUN 14  6 - 23 (mg/dL)    Creatinine, Ser 7.56  0.50 - 1.35 (mg/dL)    Calcium 9.1  8.4 - 10.5 (mg/dL)    GFR calc non Af Amer >90  >90 (mL/min)    GFR calc Af Amer >90  >90 (mL/min)   GLUCOSE, CAPILLARY     Status: Abnormal   Collection Time   12/30/10  7:14 AM      Component Value Range Comment   Glucose-Capillary 148 (*) 70 - 99 (mg/dL)    Comment 1 Notify RN  GLUCOSE, CAPILLARY     Status: Abnormal   Collection Time   12/30/10 11:30 AM      Component Value Range Comment   Glucose-Capillary 175 (*) 70 - 99 (mg/dL)    Comment 1 Notify RN     BASIC METABOLIC PANEL     Status: Abnormal   Collection Time   12/30/10  2:05 PM      Component Value Range Comment   Sodium 136  135 - 145 (mEq/L)    Potassium 2.8 (*) 3.5 - 5.1 (mEq/L)    Chloride 97  96 - 112 (mEq/L)    CO2 28  19 - 32 (mEq/L)    Glucose, Bld 186 (*) 70 - 99 (mg/dL)    BUN 15  6 - 23 (mg/dL)    Creatinine, Ser 9.60  0.50 - 1.35 (mg/dL)    Calcium 9.1  8.4 - 10.5 (mg/dL)    GFR calc non Af Amer 79 (*) >90 (mL/min)    GFR calc Af Amer >90  >90 (mL/min)   GLUCOSE, CAPILLARY     Status: Abnormal   Collection Time   12/30/10  5:21 PM      Component Value Range Comment   Glucose-Capillary 125 (*) 70 - 99 (mg/dL)    Comment 1 Notify RN     GLUCOSE, CAPILLARY     Status: Abnormal   Collection Time   12/30/10  8:19 PM      Component Value Range Comment   Glucose-Capillary 145 (*) 70 - 99 (mg/dL)    Comment 1 Notify RN     GLUCOSE, CAPILLARY     Status: Abnormal   Collection Time   12/30/10 10:06 PM      Component Value Range Comment   Glucose-Capillary 168 (*) 70 - 99 (mg/dL)    Comment 1 Notify RN     GLUCOSE, CAPILLARY     Status: Abnormal   Collection Time   12/31/10  7:18 AM       Component Value Range Comment   Glucose-Capillary 134 (*) 70 - 99 (mg/dL)    Comment 1 Notify RN     GLUCOSE, CAPILLARY     Status: Abnormal   Collection Time   12/31/10 11:22 AM      Component Value Range Comment   Glucose-Capillary 157 (*) 70 - 99 (mg/dL)   BASIC METABOLIC PANEL     Status: Abnormal   Collection Time   12/31/10  1:08 PM      Component Value Range Comment   Sodium 138  135 - 145 (mEq/L)    Potassium 3.1 (*) 3.5 - 5.1 (mEq/L)    Chloride 96  96 - 112 (mEq/L)    CO2 31  19 - 32 (mEq/L)    Glucose, Bld 160 (*) 70 - 99 (mg/dL)    BUN 12  6 - 23 (mg/dL)    Creatinine, Ser 4.54  0.50 - 1.35 (mg/dL)    Calcium 9.8  8.4 - 10.5 (mg/dL)    GFR calc non Af Amer >90  >90 (mL/min)    GFR calc Af Amer >90  >90 (mL/min)   GLUCOSE, CAPILLARY     Status: Abnormal   Collection Time   12/31/10  4:52 PM      Component Value Range Comment   Glucose-Capillary 172 (*) 70 - 99 (mg/dL)    Comment 1 Notify RN     GLUCOSE, CAPILLARY     Status: Abnormal   Collection Time  12/31/10  8:49 PM      Component Value Range Comment   Glucose-Capillary 168 (*) 70 - 99 (mg/dL)    Comment 1 Notify RN      Comment 2 Repeat Test      Comment 3 Documented in Chart         .  Patient sitting comfortably in his Bed. No visible secretions of my evaluation.. Patient has weak cough  Trach site with granulation tissue at base, stoma is closed. He is fair oral motor control. No gross cranial nerve abnormalities I can see on exam today. Patient was able to follow simple one-step commands. He communicated with simple yes no and shakes.  He had diminished fine motor movement in all 4 limbs. He may have had some underlying ataxia on the right  Strength was grossly3/5 proximally in both upper limbs. Lower extremity grossly3/5 proximal to3/5 distally. Patient did have sensation to gross pain stimulation. Reflexes are 1+ grossly throughout. Patient had poor insight and and awareness. Heart was irregularly  irregular. Chest was clear. Abdomen soft nontender. PEG site was clean and intact with no drainage. Skin throughout was generally intact. Mild edema in all 4 limbs at one plus,@+ LUE.All 4 limbs were warm with 2+ pulses.    Assessment/Plan: 1. Functional deficits secondary to R cerebellar ICH severe R UE and R LE ataxia which require 3+ hours per day of interdisciplinary therapy in a comprehensive inpatient rehab setting.  Team conf today Physiatrist is providing close team supervision and 24 hour management of active medical problems listed below. Physiatrist and rehab team continue to assess barriers to discharge/monitor patient progress toward functional and medical goals. Mobility: Bed Mobility Bed Mobility: Yes Rolling Right: 2: Max assist;With rail Rolling Left: 3: Mod assist Right Sidelying to Sit: With rails;2: Max assist Sitting - Scoot to Edge of Bed: 1: +1 Total assist;Other (comment) (significant posterior right lean) Transfers Sit to Stand: 1: +1 Total assist Sit to Stand Details (indicate cue type and reason): significant posterior right lean, slow to initiate movement Stand to Sit: 1: +1 Total assist Stand Pivot Transfer Details (indicate cue type and reason): poor LE eccentric control with uncontrolled descent, significant posterior right lean Squat Pivot Transfers: 1: +2 Total assist Squat Pivot Transfer Details (indicate cue type and reason): total assist + 2 (pt = 50%), poor graded movement and weight shift, slow to initiate with LEs Ambulation/Gait Stairs: No (pre-gait level at this time, unsafe to attempt) Naval architect Mobility: No  ADL:    Cognition: Cognition Overall Cognitive Status: Impaired Arousal/Alertness: Awake/alert Orientation Level: Oriented to person;Oriented to place Attention: Sustained Sustained Attention: Impaired Sustained Attention Impairment: Functional basic Memory: Impaired Memory Impairment: Decreased recall of new  information;Decreased long term memory Decreased Long Term Memory: Verbal basic;Functional basic Awareness: Impaired Awareness Impairment: Emergent impairment Problem Solving: Impaired Problem Solving Impairment: Functional basic Executive Function: Self Monitoring;Self Correcting Self Monitoring: Impaired Self Monitoring Impairment: Functional basic Self Correcting: Impaired Self Correcting Impairment: Verbal basic;Functional basic Safety/Judgment: Appears intact Cognition Arousal/Alertness: Awake/alert Orientation Level: Oriented to person;Oriented to place  2. Anticoagulation/DVT prophylaxis with Pharmaceutical:no anticoag use SCDs 3. Pain Management:monitor Patient Active Hospital Problem List: 4.  Afib cannot use warfarin due to ICH Diabetes mellitus (11/19/2010)    POA: Yes 5.  LUE edema, D/C PICC 6.   DM Lantus monitor CBG 7. Respiratory failure (11/19/2010)    Assessment: trach wean as able reduce to #4 cuffless today    8.Gout (11/20/2010)  9. UTI cipro through sat 10.  Loose stools likely due to Cipro no watery stools, no other discomfort, good po intake 11.  Hypo K may be realted to frequent stooling recheck BMET, increase oral supplements 10  KIRSTEINS,ANDREW E 01/01/2011, 7:13 AM

## 2011-01-01 NOTE — Progress Notes (Signed)
Physical Therapy Note  Patient Details  Name: [name] MRN: [id number] Date of Birth: Apr 15, 1955 Today's Date: 01/01/2011  1000-1105 65 min  Individual treatment  Denies pain.  Constant hiccups.  Spoke with RN, who explained to patient that it was not yet time for baclofen.  Skilled Clinical Intervention:  Patient seen this am for bathing and dressing this am seated in wheelchair at sink.  Patient indicated need to void, and had continent BM in bathroom, using drop arm commode over toilet.  Patient reports no memory of walking in the hallway with PT.  Disoriented to time and mod cueing, and increased time to utilize calendar in room. Continue to address bilateral upper extremity coordination, postural control, and basic sequencing during simple self care activities.   [name] 01/01/2011, 11:15 AM

## 2011-01-01 NOTE — Progress Notes (Signed)
Speech Pathology: Dysphagia Treatment Note  Group Session  Time: 1130-1145  Patient was observed with : Mechanical Soft / Ground and Nectar liquids.  Patient was noted to have s/s of aspiration : Yes:  Cough x1 on following solids with liquids  Lung Sounds:  WNL Temperature: WNL  Patient required: moderate cues to consistently follow precautions/strategies  Clinical Impression: SLP facilitated session with trials of Dys.2 and Nectar-thick liquids without chin tuck with cough x1 on solid consistency followed by liquid sip; however good strong cough response was elicited and appeared to effectively remove penetrates/aspirates.    Recommendations:  Recommend upgraded texture/liquid trials  Pain:   none Intervention Required:   No  Goals: Progressing   Fae Pippin, M.A., CCC-SLP [phone]

## 2011-01-01 NOTE — Progress Notes (Signed)
Physical Therapy Session Note  Patient Details  Name: [name] MRN: [id number] Date of Birth: [birth year]  Today's Date: 01/01/2011 Time: 4098-1191 Time Calculation (min): 32 min  Precautions: Precautions Precautions: Fall Precaution Comments: decannulated, monitor O2 during activity Required Braces or Orthoses: No Restrictions Weight Bearing Restrictions: No Other Position/Activity Restrictions: extensor thrust during mobility  Short Term Goals: PT Short Term Goal 1: Patient will consistently perform bed mobility and supine-sit with min assist. PT Short Term Goal 1 - Progress: Progressing toward goal PT Short Term Goal 2: Patient will consistently transfer with mod assist. PT Short Term Goal 2 - Progress: Progressing toward goal PT Short Term Goal 3: Patient will maintain dynamic sitting balance > 10 min with min assist. PT Short Term Goal 3 - Progress: Progressing toward goal PT Short Term Goal 4: Patient will gait 15 feet with max assist. PT Short Term Goal 4 - Progress: Progressing toward goal PT Short Term Goal 5: Patient will propel W/C > 50 feet with min assist. PT Short Term Goal 5 - Progress: Progressing toward goal  Skilled Therapeutic Interventions/Progress Updates:     General Chart Reviewed: Yes Family/Caregiver Present: No  Vital Signs Therapy Vitals Pulse Rate: 74-86 bpm throughout session Oxygen Therapy SpO2: 98-99% throughout session O2 Device: None (Room air)  Pain Pain Assessment Pain Assessment: No/denies pain Pain Score: 0-No pain  Other Treatments  Patient more alert upon entry, able to stay alert for participation with PT. Squat-pivot transfers bed<->W/C mod assist with manual facilitation for anterior weight shift and sequencing. Sitting dynamic balance activity with pet therapy with min assist for anterior right weight shift to decrease posterior left lean in unsupported sitting. Standing pre-gait activities with focus on anterior weight  shift and symmetrical weight bearing, progressing to gait with bilat hand held assist total assist + 2, patient = 25%, manual facilitation for anterior weight shift, verbal cues for increased BOS. Bilat LE scissoring and ataxic LE movements in open chain positions.  Therapy/Group: Individual Therapy  [name] 01/01/2011, 4:50 PM

## 2011-01-01 NOTE — Progress Notes (Signed)
Occupational Therapy Note [employer]  Patient Details  Name: [name] MRN: [id number] Date of Birth: December 10, 1955 Today's Date: 01/01/2011 [id number] 30 min Group treatment Skilled clinical intervention:  Patient participated in [employer] today to address safety with swallowing, following swallowing precautions regarding rate, chin tuck, and also functional use of upper extremities.  Patient limited by decreased coordination right upper extremity, and weakness / decreased sensation left upper extremity.  Utilized weighted utensil (spoon) in dominant right hand with constant min assist.  Right eye patched during meal.    [name] 01/01/2011, 12:21 PM

## 2011-01-01 NOTE — Progress Notes (Signed)
Patient tolerating dys 2 nectar thick liquids. Medicines whole in puree. Patient eating at least 50% of meals . jevity not required via peg paeg site scabbed with dried drainage around insertion site . Cleaned site to peg with soap and water . Patient  Complained of hiccups this am . Simethicone 80mg  po given with relief of gas. o2 sat between 92-99 ra . Old trach dsg intact and changed . P. Love , PA applied silver nitrate. Dry dsg intact. Mod assist +2 to scoot pivot. Continue with plan of care.                                                                                                                  [name]

## 2011-01-01 NOTE — Progress Notes (Signed)
Patient Details  Name: [name] MRN: [id number] Date of Birth: January 31, 1955  Today's Date: 01/01/2011  Met with pt and discussed leisure interests on 12/13.  Pt on hold for TR services at this time.  Will continue to monitor for future participation.  Clemente Dewey 01/01/2011, 8:30 AM

## 2011-01-01 NOTE — Progress Notes (Signed)
Speech Pathology: Dysphagia Treatment Note  Individual session  Time: 1550-1620  Patient was observed with : Regular textures and Thin liquids.  Patient was noted to have s/s of aspiration : No  Lung Sounds:  WNL Temperature: WNL  Patient required: minimal cues to consistently follow precautions/strategies  Clinical Impression: SLP facilitated session with moderate assist to complete oral care; patient consumed cracker and water via cup with minimal assist semantic cues to take small bites due to impulsivity and decreased coordination.  Patient exhibited no overt s/s of aspiration and RN reported no change in lung sounds.    Recommendations:  Continue trial upgrades   Pain:   none Intervention Required:   No  Goals: Progressing    Fae Pippin, M.A., CCC-SLP [phone]

## 2011-01-01 NOTE — Progress Notes (Signed)
B/p at 1638 157/115 pulse 74 . Rechecked b/p for 150/90 sitting up in wheelchair. Patient denies pain no new complaints reported . Continue with plan of care.                                                    Cleotilde Neer

## 2011-01-01 NOTE — Progress Notes (Signed)
Physical Therapy Session Note  Patient Details  Name: [name] MRN: [id number] Date of Birth: 1955/04/19  Today's Date: 01/01/2011 Time: 1445-1500 Time Calculation (min): 15 min  Precautions: Precautions Precautions: Fall Precaution Comments: decannulated, monitor O2 during activity Required Braces or Orthoses: No Restrictions Weight Bearing Restrictions: No Other Position/Activity Restrictions: extensor thrust during mobility  Short Term Goals: PT Short Term Goal 1: Patient will consistently perform bed mobility and supine-sit with min assist. PT Short Term Goal 1 - Progress: Progressing toward goal PT Short Term Goal 2: Patient will consistently transfer with mod assist. PT Short Term Goal 2 - Progress: Progressing toward goal PT Short Term Goal 3: Patient will maintain dynamic sitting balance > 10 min with min assist. PT Short Term Goal 3 - Progress: Progressing toward goal PT Short Term Goal 4: Patient will gait 15 feet with max assist. PT Short Term Goal 4 - Progress: Progressing toward goal PT Short Term Goal 5: Patient will propel W/C > 50 feet with min assist. PT Short Term Goal 5 - Progress: Progressing toward goal  Skilled Therapeutic Interventions/Progress Updates:     General Chart Reviewed: Yes Family/Caregiver Present: No   Pain Pain Assessment Pain Assessment: No/denies pain Pain Score: 0-No pain  Other Treatments  Patient somnolent this afternoon. Attempted several strategies to awaken patient, however patient unable to stay altert > 15 seconds. Bilat LE PROM in bed to maintain LE ROM and further attempt to rouse patient. Will check back this afternoon.  Therapy/Group: Individual Therapy  [name] 01/01/2011, 3:02 PM

## 2011-01-02 ENCOUNTER — Inpatient Hospital Stay (HOSPITAL_COMMUNITY): Payer: 59

## 2011-01-02 LAB — BASIC METABOLIC PANEL
BUN: 12 mg/dL (ref 6–23)
CO2: 28 mEq/L (ref 19–32)
Calcium: 9.8 mg/dL (ref 8.4–10.5)
Chloride: 99 mEq/L (ref 96–112)
Creatinine, Ser: 0.88 mg/dL (ref 0.50–1.35)
Glucose, Bld: 127 mg/dL — ABNORMAL HIGH (ref 70–99)

## 2011-01-02 LAB — GLUCOSE, CAPILLARY
Glucose-Capillary: 159 mg/dL — ABNORMAL HIGH (ref 70–99)
Glucose-Capillary: 230 mg/dL — ABNORMAL HIGH (ref 70–99)

## 2011-01-02 MED ORDER — DOXAZOSIN MESYLATE 1 MG PO TABS
1.0000 mg | ORAL_TABLET | Freq: Every day | ORAL | Status: DC
  Administered 2011-01-02 – 2011-01-17 (×16): 1 mg via ORAL
  Filled 2011-01-02 (×19): qty 1

## 2011-01-02 NOTE — Progress Notes (Signed)
Physical Therapy Session Note  Patient Details  Name: [name] MRN: [id number] Date of Birth: 07-20-1955  Today's Date: 01/02/2011 Time: 0817-0900 Time Calculation (min): 43 min  Precautions: Precautions Precautions: Fall Precaution Comments: decannulated, monitor O2 during activity Required Braces or Orthoses: No Restrictions Weight Bearing Restrictions: No Other Position/Activity Restrictions: extensor thrust during mobility  Short Term Goals: PT Short Term Goal 1: Patient will consistently perform bed mobility and supine-sit with min assist. PT Short Term Goal 1 - Progress: Progressing toward goal PT Short Term Goal 2: Patient will consistently transfer with mod assist. PT Short Term Goal 2 - Progress: Progressing toward goal PT Short Term Goal 3: Patient will maintain dynamic sitting balance > 10 min with min assist. PT Short Term Goal 3 - Progress: Progressing toward goal PT Short Term Goal 4: Patient will gait 15 feet with max assist. PT Short Term Goal 4 - Progress: Progressing toward goal PT Short Term Goal 5: Patient will propel W/C > 50 feet with min assist. PT Short Term Goal 5 - Progress: Progressing toward goal  Skilled Therapeutic Interventions/Progress Updates: Tx focused on neuromuscular re-ed for safe bed mobility and transfers.  Supine> L sidelying in flat bed without rail with S; L sidelying> sit with min A for control, safety. Pt had poor grading of RLE movements.   Static sitting balance EOB= S, during donning of shoes by PT.  Bed> W/C with armrest removed,  squat/pivot with mod A, VCs and manual cues for leaning forward, head/hips relationship for lifting hips and directing toward seat.  Pt initially attempted a SPT, but extensor thrust/ pushing tendency made this unsafe. W/C > mat transfer as above.  Sitting EOB, facilitation for forward, symmetric lean, using BUEs on rolling stool in front of patient.  Sit> stand with min A, mod A to maintain due to extensor  tendencies, leaning backwards and legs against mat.  Pt stated he needed to use BR as soon as he stood.  Returned to room.  Toilet transfer with mod A for SPT, using wall bar, with safety concerns due to poor motor control in standing.  Nurse tech in room, to monitor pt.      Vital Signs Therapy Vitals Pulse Rate: 65  Oxygen Therapy SpO2: 95 % O2 Device: None (Room air) Pain Pain Assessment Pain Assessment: No/denies pain       Therapy/Group: Individual Therapy  Layni Kreamer 01/02/2011, 9:10 AM

## 2011-01-02 NOTE — Progress Notes (Signed)
Speech Language Pathology Progress Note  Patient Details  Name: [name] MRN: [id number] Date of Birth: 1955-06-20  Precautions: Precautions Precautions: Fall Precaution Comments: decannulated, monitor O2 during activity Required Braces or Orthoses: No Restrictions Weight Bearing Restrictions: No Other Position/Activity Restrictions: extensor thrust during mobility  Short Term Goals: set 12/24/10  1. Patient will demonstrate sustained attention to task for 10-12 minutes with minimal assist semantic cues  (Met) 2. Patient will increase speech intelligibility at phrase level expression with moderate assist semantic cues to increase vocal intensity  (Met) 3. Patient will consume dysphagia 2 (chopped) and nectar thick liquids showing no overt s/s of aspiration with the use of a chin tuck and other compensatory strategies with min assist (Met) 4. Patient will demonstrate day to day carryover of self care information with minimal assist semantic cues (Not Met) 5. Patient will use PMSV during all waking hours with conintuous SpO2 monitoring and saturation greater than 90% starting 12/24/10  (Met) 6. Patient will don and doff PMSV with moderate assist semantic and tactile cues  (Met)  Progress Updates: Patient made functional gains this week in the areas of speech production and swallowing function; Patient has progressed from wearing a PMSV with SLP to all waking hours and then as decannulation. Additionally, he has been wean from O2 and is now on room air.  He speech production at the phrase level is clear has increased in vocal intensity.  Per his MBSS today and diagnostic treatment 01/01/11 he has increased his oral strength; however, ataxia continues to impact his swallow function and he needs to continue to work on carrying over the use of compensatory strategies to facilitate safety with p.o. Intake.  His memory and initiation appear to continue to impact his function and will be address  this next week.   Short Term Goals: set 01/02/11 1. Patient will consume dysphagia 3 (soft solids) and nectar-thick liquids showing no overt s/s of aspiration with the use of a chin tuck and other compensatory strategies with moderate assist  2. Patient will demonstrate day to day carryover of self care information with moderate assist semantic cues  3. Patient will demonstrate selective attention to task fr 10-12 minutes with minimal assist semantic cues 4. Patient will initiate verbal expression with requests for help or for communicating information with moderate assist semantic cues 5. Patient will self monitor and correct vocal intensity with minimal assist semantic cues    Oral/Motor: Oral Motor/Sensory Function Labial ROM: Reduced left Labial Symmetry: Abnormal symmetry left;Abnormal symmetry right Labial Strength: Reduced Lingual ROM: Reduced right;Reduced left Lingual Strength: Reduced Motor Speech Intelligibility: Intelligibility reduced Comprehension: Auditory Comprehension Yes/No Questions: Impaired Basic Immediate Environment Questions: 75-100% accurate Complex Questions: 50-74% accurate Commands: Impaired Multistep Basic Commands: 50-74% accurate Conversation: Simple Interfering Components: Anxiety;Processing speed (trach with PMSV) EffectiveTechniques: Extra processing time;Repetition;Visual/Gestural cues Visual Recognition/Discrimination Discrimination: Exceptions to [facility] Reading Comprehension Reading Status: Impaired Interfering Components: Attention;Visual scanning;Processing time;Visual acuity;Eye glasses not available Effective Techniques: Large print;Other (comment) (simple print) Expression: Expression Primary Mode of Expression: Verbal Verbal Expression Initiation: Impaired Automatic Speech: Day of week;Month of year (DOW 6/7, MOY perseverated on DOW) Level of Generative/Spontaneous Verbalization: Word Repetition: No impairment Naming: Not  tested Pragmatics: Impairment Impairments: Monotone;Eye contact Interfering Components: Attention Effective Techniques: Open ended questions;Semantic cues Non-Verbal Means of Communication: Not applicable Written Expression Written Expression: Not tested  Charlane Ferretti., CCC-SLP [phone] Umaiza Matusik 01/02/2011 11:15 AM

## 2011-01-02 NOTE — Progress Notes (Signed)
Patient ID: [name], male   DOB: December 05, 1955, 55 y.o.   MRN: [id number] Subjective/Complaints:Speech  much improved, vocal volume much better oriented to person and place.  MBS for this am Review of Systems  HENT: Negative for congestion.        Trach removed  Respiratory: Negative.   Gastrointestinal: Positive for diarrhea. Negative for nausea and abdominal pain.       PEG  Genitourinary: Negative for frequency and hematuria.       Condom cath  All other systems reviewed and are negative.    Objective: Vital Signs: Blood pressure 164/112, pulse 74, temperature 98.1 F (36.7 C), temperature source Oral, resp. rate 18, height 6' (1.829 m), weight 95.4 kg (210 lb 5.1 oz), SpO2 98.00%. No results found. Results for orders placed during the hospital encounter of 12/22/10 (from the past 72 hour(s))  GLUCOSE, CAPILLARY     Status: Abnormal   Collection Time   12/30/10 11:30 AM      Component Value Range Comment   Glucose-Capillary 175 (*) 70 - 99 (mg/dL)    Comment 1 Notify RN     BASIC METABOLIC PANEL     Status: Abnormal   Collection Time   12/30/10  2:05 PM      Component Value Range Comment   Sodium 136  135 - 145 (mEq/L)    Potassium 2.8 (*) 3.5 - 5.1 (mEq/L)    Chloride 97  96 - 112 (mEq/L)    CO2 28  19 - 32 (mEq/L)    Glucose, Bld 186 (*) 70 - 99 (mg/dL)    BUN 15  6 - 23 (mg/dL)    Creatinine, Ser 4.09  0.50 - 1.35 (mg/dL)    Calcium 9.1  8.4 - 10.5 (mg/dL)    GFR calc non Af Amer 79 (*) >90 (mL/min)    GFR calc Af Amer >90  >90 (mL/min)   GLUCOSE, CAPILLARY     Status: Abnormal   Collection Time   12/30/10  5:21 PM      Component Value Range Comment   Glucose-Capillary 125 (*) 70 - 99 (mg/dL)    Comment 1 Notify RN     GLUCOSE, CAPILLARY     Status: Abnormal   Collection Time   12/30/10  8:19 PM      Component Value Range Comment   Glucose-Capillary 145 (*) 70 - 99 (mg/dL)    Comment 1 Notify RN     GLUCOSE, CAPILLARY     Status: Abnormal   Collection Time    12/30/10 10:06 PM      Component Value Range Comment   Glucose-Capillary 168 (*) 70 - 99 (mg/dL)    Comment 1 Notify RN     GLUCOSE, CAPILLARY     Status: Abnormal   Collection Time   12/31/10  7:18 AM      Component Value Range Comment   Glucose-Capillary 134 (*) 70 - 99 (mg/dL)    Comment 1 Notify RN     GLUCOSE, CAPILLARY     Status: Abnormal   Collection Time   12/31/10 11:22 AM      Component Value Range Comment   Glucose-Capillary 157 (*) 70 - 99 (mg/dL)   BASIC METABOLIC PANEL     Status: Abnormal   Collection Time   12/31/10  1:08 PM      Component Value Range Comment   Sodium 138  135 - 145 (mEq/L)    Potassium 3.1 (*) 3.5 -  5.1 (mEq/L)    Chloride 96  96 - 112 (mEq/L)    CO2 31  19 - 32 (mEq/L)    Glucose, Bld 160 (*) 70 - 99 (mg/dL)    BUN 12  6 - 23 (mg/dL)    Creatinine, Ser 1.61  0.50 - 1.35 (mg/dL)    Calcium 9.8  8.4 - 10.5 (mg/dL)    GFR calc non Af Amer >90  >90 (mL/min)    GFR calc Af Amer >90  >90 (mL/min)   GLUCOSE, CAPILLARY     Status: Abnormal   Collection Time   12/31/10  4:52 PM      Component Value Range Comment   Glucose-Capillary 172 (*) 70 - 99 (mg/dL)    Comment 1 Notify RN     GLUCOSE, CAPILLARY     Status: Abnormal   Collection Time   12/31/10  8:49 PM      Component Value Range Comment   Glucose-Capillary 168 (*) 70 - 99 (mg/dL)    Comment 1 Notify RN      Comment 2 Repeat Test      Comment 3 Documented in Chart     GLUCOSE, CAPILLARY     Status: Abnormal   Collection Time   01/01/11  7:17 AM      Component Value Range Comment   Glucose-Capillary 143 (*) 70 - 99 (mg/dL)   GLUCOSE, CAPILLARY     Status: Abnormal   Collection Time   01/01/11 11:22 AM      Component Value Range Comment   Glucose-Capillary 150 (*) 70 - 99 (mg/dL)    Comment 1 Notify RN     GLUCOSE, CAPILLARY     Status: Abnormal   Collection Time   01/01/11  4:42 PM      Component Value Range Comment   Glucose-Capillary 136 (*) 70 - 99 (mg/dL)   GLUCOSE,  CAPILLARY     Status: Abnormal   Collection Time   01/01/11 10:11 PM      Component Value Range Comment   Glucose-Capillary 207 (*) 70 - 99 (mg/dL)   BASIC METABOLIC PANEL     Status: Abnormal   Collection Time   01/02/11  6:35 AM      Component Value Range Comment   Sodium 138  135 - 145 (mEq/L)    Potassium 3.0 (*) 3.5 - 5.1 (mEq/L)    Chloride 99  96 - 112 (mEq/L)    CO2 28  19 - 32 (mEq/L)    Glucose, Bld 127 (*) 70 - 99 (mg/dL)    BUN 12  6 - 23 (mg/dL)    Creatinine, Ser 0.96  0.50 - 1.35 (mg/dL)    Calcium 9.8  8.4 - 10.5 (mg/dL)    GFR calc non Af Amer >90  >90 (mL/min)    GFR calc Af Amer >90  >90 (mL/min)       .  Patient sitting comfortably in his Bed. No visible secretions of my evaluation.. Patient has weak cough  Trach site with granulation tissue at base, stoma is closed. He is fair oral motor control. No gross cranial nerve abnormalities I can see on exam today. Patient was able to follow simple one-step commands. He communicated with simple yes no and shakes.  He had diminished fine motor movement in all 4 limbs. He may have had some underlying ataxia on the right  Strength was grossly3/5 proximally in both upper limbs. Lower extremity grossly3/5 proximal to3/5 distally. Patient did  have sensation to gross pain stimulation. Reflexes are 1+ grossly throughout. Patient had poor insight and and awareness. Heart was irregularly irregular. Chest was clear. Abdomen soft nontender. PEG site was clean and intact with no drainage. Skin throughout was generally intact. Mild edema in all 4 limbs at one plus,@+ LUE.All 4 limbs were warm with 2+ pulses.    Assessment/Plan: 1. Functional deficits secondary to R cerebellar ICH severe R UE and R LE ataxia which require 3+ hours per day of interdisciplinary therapy in a comprehensive inpatient rehab setting.  Team conf today Physiatrist is providing close team supervision and 24 hour management of active medical problems listed  below. Physiatrist and rehab team continue to assess barriers to discharge/monitor patient progress toward functional and medical goals. Mobility: Bed Mobility Bed Mobility: Yes Rolling Right: 2: Max assist;With rail Rolling Left: 3: Mod assist Right Sidelying to Sit: With rails;2: Max assist Sitting - Scoot to Edge of Bed: 1: +1 Total assist;Other (comment) (significant posterior right lean) Transfers Sit to Stand: 1: +1 Total assist Sit to Stand Details (indicate cue type and reason): significant posterior right lean, slow to initiate movement Stand to Sit: 1: +1 Total assist Stand Pivot Transfer Details (indicate cue type and reason): poor LE eccentric control with uncontrolled descent, significant posterior right lean Squat Pivot Transfers: 1: +2 Total assist Squat Pivot Transfer Details (indicate cue type and reason): total assist + 2 (pt = 50%), poor graded movement and weight shift, slow to initiate with LEs Ambulation/Gait Stairs: No (pre-gait level at this time, unsafe to attempt) Naval architect Mobility: No  ADL:    Cognition: Cognition Overall Cognitive Status: Impaired Arousal/Alertness: Awake/alert Orientation Level: Oriented to person Attention: Sustained Sustained Attention: Impaired Sustained Attention Impairment: Functional basic Memory: Impaired Memory Impairment: Decreased recall of new information;Decreased long term memory Decreased Long Term Memory: Verbal basic;Functional basic Awareness: Impaired Awareness Impairment: Emergent impairment Problem Solving: Impaired Problem Solving Impairment: Functional basic Executive Function: Self Monitoring;Self Correcting Self Monitoring: Impaired Self Monitoring Impairment: Functional basic Self Correcting: Impaired Self Correcting Impairment: Verbal basic;Functional basic Safety/Judgment: Appears intact Cognition Arousal/Alertness: Awake/alert Orientation Level: Oriented to person  2.  Anticoagulation/DVT prophylaxis with Pharmaceutical:no anticoag use SCDs 3. Pain Management:monitor Patient Active Hospital Problem List: 4.  Afib cannot use warfarin due to ICH Diabetes mellitus (11/19/2010)    POA: Yes 5.  LUE edema, D/C PICC 6.   DM Lantus monitor CBG 7. Respiratory failure (11/19/2010)    Assessment: trach wean as able reduce to #4 cuffless today    8.Gout (11/20/2010)     9. UTI cipro through sat 12/22 10.  Loose stools likely due to Cipro no watery stools, no other discomfort, good po intake 11.  Hypo K monitor no diuretics 12. AM elevated BP add alpha blocker at noc 11  April Carlyon E 01/02/2011, 8:42 AM

## 2011-01-02 NOTE — Progress Notes (Signed)
Nutrition Follow-up  MBSS performed 12/21, pt advanced to Dys 3 diet with nectar liquids. Per RN and chart review, pt eating 100%. Note that pt is ordered for PRN bolus of 320cc Jevity 1.2 if consumes <50% of meals. Pt also ordered for this bolus at bedtime and is currently receiving the bedtime bolus.  Diet Order:  Dysphagia 3 with nectar thickened liquids  Meds: Scheduled Meds:   . baclofen  5 mg Oral BID  . carvedilol  25 mg Oral BID WC  . ciprofloxacin  250 mg Oral BID  . diltiazem  90 mg Oral Q6H  . doxazosin  1 mg Oral QHS  . feeding supplement (JEVITY 1.2)  237 mL Per Tube TID PC & HS  . free water  100 mL Per Tube Q8H  . insulin aspart  0-15 Units Subcutaneous TID WC  . insulin aspart  0-5 Units Subcutaneous QHS  . insulin glargine  15 Units Subcutaneous BID  . isosorbide-hydrALAZINE  2 tablet Oral TID  . pantoprazole  40 mg Oral Q1200  . potassium chloride  40 mEq Oral BID  . ramipril  10 mg Oral BID  . rosuvastatin  5 mg Oral q1800  . simethicone  80 mg Oral QID  . DISCONTD: diltiazem  90 mg Oral Q6H  . DISCONTD: pantoprazole sodium  40 mg Oral Q1200   Continuous Infusions:  PRN Meds:.acetaminophen, ALPRAZolam, alum & mag hydroxide-simeth, bisacodyl, diphenhydrAMINE, food thickener, guaiFENesin-dextromethorphan, polyethylene glycol, promethazine, promethazine, promethazine, silver nitrate applicators, sodium chloride, traZODone  Labs:  CMP     Component Value Date/Time   NA 138 01/02/2011 0635   K 3.0* 01/02/2011 0635   CL 99 01/02/2011 0635   CO2 28 01/02/2011 0635   GLUCOSE 127* 01/02/2011 0635   BUN 12 01/02/2011 0635   CREATININE 0.88 01/02/2011 0635   CALCIUM 9.8 01/02/2011 0635   PROT 7.1 12/23/2010 0420   ALBUMIN 2.4* 12/23/2010 0420   AST 20 12/23/2010 0420   ALT 23 12/23/2010 0420   ALKPHOS 92 12/23/2010 0420   BILITOT 0.2* 12/23/2010 0420   GFRNONAA >90 01/02/2011 0635   GFRAA >90 01/02/2011 0635     Intake/Output Summary (Last 24 hours) at  01/02/11 1133 Last data filed at 01/02/11 7846  Gross per 24 hour  Intake    840 ml  Output      1 ml  Net    839 ml    Weight Status:  95.4 kg  Nutrition Dx:  Swallowing difficulty - persists.  Goal:  PO's to meet >90% of estimated needs. Met.  Intervention:   1. Continue current nutrition interventions. Pt eating well at this time. Note Magic Cup supplement provided from nutritional services for additional protein and kcal on meal trays for dysphagia patients.  2. RD to continue to follow nutrition care plan.  Monitor:  Weights, labs, PO intake, I/O's  Adair Laundry Pager #:  [phone]

## 2011-01-02 NOTE — Progress Notes (Signed)
Occupational Therapy Note [facility]  Patient Details  Name: [name] MRN: [id number] Date of Birth: [birth year] Today's Date: 01/02/2011 1130-1145 15 min Group session Pain 8/10 in right foot. Nurse aware and has recently issued tylenol. Skilled clinical intervention:  Patient seen in [facility] today to address coordination in bilateral upper extremities.  Patient wityh recent MBSS, and now on DIII with nectar liquids by straw to encourage chin tuck.  Patient needs cues to manage head position initially.   [name] 01/02/2011, 2:04 PM

## 2011-01-02 NOTE — Progress Notes (Signed)
Modified Barium Swallow Procedure Note Patient Details  Name: BRANSEN FASSNACHT MRN: [id number] Date of Birth: 1955/09/17  Today's Date: 01/02/2011 Time: 0930-1000 Time Calculation (min): 30 min  Past Medical History:  Past Medical History  Diagnosis Date  . Diabetes mellitus   . Coronary artery disease   . Hypertension   . Gout   . CHF (congestive heart failure)   . Afib   . Arthritis   . Hypertensive emergency 11/19/2010  . Pulmonary edema 11/19/2010  . Acute exacerbation of congestive heart failure 11/19/2010  . Thyroiditis 11/20/2010  . ICH (intracerebral hemorrhage) 12/22/2010  . Stroke 11/19/2010  . Respiratory failure 11/19/2010  . CAD (coronary artery disease) 11/20/2010  . Physical deconditioning 12/22/2010  . Diabetes mellitus 11/19/2010  . A-fib 11/19/2010  . Hyperlipemia 11/21/2010   Past Surgical History:  Past Surgical History  Procedure Date  . Tracheostomy tube placement 11/28/2010    Procedure: TRACHEOSTOMY;  Surgeon: Susy Frizzle, MD;  Location: [facility] OR;  Service: ENT;  Laterality: N/A;  . Peg placement 12/03/2010    Procedure: PERCUTANEOUS ENDOSCOPIC GASTROSTOMY (PEG) PLACEMENT;  Surgeon: Hart Carwin, MD;  Location: [facility] ENDOSCOPY;  Service: Endoscopy;  Laterality: N/A;   HPI: Mr.Maranto is an 55 y.o. male with H/O COPD, DM, A fib, CAD, CHF, and gout admitted 11/19/2010 with acute onset of slurred speech and left sided weakness. CT head without acute abnormality. Patient became diaphoretic with increased WOB with inability to speak, LUE flaccidity while in Xray. He was intubated for airway support. CT angio chest without PE and evidence of CHF. CTA head and neck without thrombus, distal left vertebral artery stenosis and basilar artery stenosis. MRI brain with acute subacute infarct right SCA territory including mid brain and upper pons. Extubate briefly on 11/09 but later that day with unresponsiveness and F/U head CT with large right cerebellar infarct with hemorraghic  transformation with edema compressing 4th ventricle and mild obstructive hydrocephalus with recommendation to reverse coumadin and heparin.   On 11/10 patient, had ventriculostomy placed with good drainage of CSF. Patient comatose and unresponsive. Patient trached by Dr Pollyann Kennedy on 11/16 and PEG placed 11/21 by Dr. Juanda Chance. Developed fevers and diarrhea, started on flagyl for questionable C diff. Ventilator wean initiated On, 11/24 with worsening of MS and repeat CT head with slight increase in hemorrhage and questionable new right parietal infarct and patient reintubated. Dr Phoebe Perch consulted and no surgical intervention recommended. Vent wean initiated 11/28 and patient tolerated extubation. Noted to have blood in stools with ?lower GIB. GI recommended serial hemoglobin checks. Acute stomal bleeding on 12/3 treated with change and placement of #8 cuffed trach. Blood pressures remain labile and A Fib treated with IV Cardizem. Dr [name] consulted for input on BP/afib management and recommended adding labetalol and ace inhibitors for better control. PMSV trials ongoing and patient with difficulty tolerating trials due to #8. He was downsized to a #6 cuffed trach today.   Patient transferred to CIR on 12/22/2010 . PMSV evaluation 12/23/10 and MBSS 12/24/10 with dysphagia 2 textures and nectar-thick liquids with chin tuck.  Patient decannulation 12/31/10.    Recommendation/Prognosis  Clinical Impression Dysphagia Diagnosis: Mild oral phase dysphagia;Moderate pharyngeal phase dysphagia Clinical impression: Patient presents with a moderate sensory-motor based oropharyngeal dysphagia; with a delay in swallow initiation to the pyriform sinuses resulting in aspiration of nectar-thick and thin liquds. With use of chin tuck swallow was delayed to vallecula with Nectar-thick liquids allowing for protection of the airway; however, patient demonstrated  difficulty utilizing this strategiy effectively secondary to decreased  coordination and timing of sip and chin tuck; as a result a straw was used to facilitate chin tuck prior to initiation of nectar-thick liquid sip and was increased air way protection with trace penetration x1/3. Chin tuck unsuccessful with thin liquids via straw and chin tuck. Pt observed to have mild vallecular residuals secondary to base of tongue weakness that are reduced with chin tuck.  At this time, patient judged appropriate to consume a dysphagia 3 (soft solid) diet secondary to oral weakness and concerns of fatigue (per diagnostic treatment 01/01/11),  and nectar-thick liquids with strict use of chin tuck and small sips.  Recommendations Recommended Consults: OT self-feeding Solid Consistency: Dysphagia 3 (Mechanical soft) Liquid Consistency: Nectar Liquid Administration via: Straw Medication Administration: Whole meds with puree Supervision: Full supervision/cueing for compensatory strategies (up in chair; chin tuck bites; chin tuck straw sips) Supervision: Full supervision/cueing for compensatory strategies (helper scoops small bites ) Compensations: Slow rate;Small sips/bites Postural Changes and/or Swallow Maneuvers: Out of bed for meals;Seated upright 90 degrees;Chin tuck Oral Care Recommendations: Oral care QID;Staff/trained caregiver to provide oral care Other Recommendations: Order thickener from pharmacy;Prohibited food (jello, ice cream, thin soups);Remove water pitcher;Have oral suction available Prognosis Prognosis for Safe Diet Advancement: Good Individuals Consulted Consulted and Agree with Results and Recommendations: Patient;Family member/caregiver Family Member Consulted: brother Molly Maduro  SLP Assessment/Plan  See IPOC for details  SLP Goals  1. Patient will consume dysphagia 3 (soft solids) and nectar-thick liquids showing no overt s/s of aspiration with the use of a chin tuck and other compensatory strategies with moderate assist  2. Patient will demonstrate day to  day carryover of self care information with moderate assist semantic cues    General:  Date of Onset: 11/19/10 HPI: Pt with infarct including cerebellum, midbrain and pons. Intubated in ED due to poor airway protection, CHF exacerbation. Extubated on 11/9. Reintubated on 11/10. Tracheostomy on 11/16. Cerebellar stroke with mass effect on 11/20. Admitted to CIR 12/22/10 . Decanulated 12/31/10 Type of Study: Repeat MBS Diet Prior to this Study: Dysphagia 2 (chopped) Temperature Spikes Noted: No Respiratory Status: Room air Trach Size and Type: Other (Comment) (decanulated) History of Intubation: Yes (See HPI for details) Length of Intubations (days): 6 days Date extubated: 11/28/10 Behavior/Cognition: Alert;Cooperative Oral Cavity - Dentition: Adequate natural dentition Oral Motor / Sensory Function: Within functional limits Vision: Functional for self-feeding Patient Positioning: Upright in chair Baseline Vocal Quality: Normal Volitional Cough: Weak Volitional Swallow: Able to elicit (delayed) Anatomy: Within functional limits Pharyngeal Secretions: Not observed secondary MBS Ice chips: Not tested  Reason for Referral:  Diagnostic treatment session with trials of regular textures and thin liquids revealed no overt s/s of aspiration; however, given inconsistent sensation of aspiration during previous study repeat MBSS needed to determine if silent aspiration is present.   Oral Phase Oral Preparation/Oral Phase Oral Phase: Impaired Oral Phase - Comment Oral Phase - Comment: Mild Impairment with regular textures upon diagnostic treatment session 01/01/11 [facility] with 2-3 bites; however, suspect fatigue with entire meal.   Pharyngeal Phase  Pharyngeal - Honey Pharyngeal - Honey Cup: Not tested Pharyngeal - Nectar Pharyngeal - Nectar Cup: Delayed swallow initiation;Premature spillage to valleculae;Premature spillage to pyriform;Reduced epiglottic inversion;Reduced airway/laryngeal  closure;Reduced tongue base retraction;Penetration/Aspiration during swallow;Moderate aspiration;Compensatory strategies attempted (Comment);Pharyngeal residue - valleculae (chin tuck resulted in aspiration due to poor coordination ) Penetration/Aspiration details (nectar cup): Material enters airway, passes BELOW cords without attempt by patient to eject out (  silent aspiration) Pharyngeal - Nectar Straw: Delayed swallow initiation;Premature spillage to valleculae;Reduced epiglottic inversion;Reduced airway/laryngeal closure;Reduced tongue base retraction;Penetration/Aspiration during swallow (chin tuck prior to initiation of sip, trace penetration x1) Penetration/Aspiration details (nectar straw): Material enters airway, remains ABOVE vocal cords and not ejected out Pharyngeal - Thin Pharyngeal - Thin Cup: Delayed swallow initiation;Premature spillage to pyriform;Reduced epiglottic inversion;Reduced airway/laryngeal closure;Reduced tongue base retraction;Compensatory strategies attempted (Comment);Penetration/Aspiration during swallow;Trace aspiration (chin tuck not attempted secondary to delay to p.s.) Penetration/Aspiration details (thin cup): Material enters airway, passes BELOW cords without attempt by patient to eject out (silent aspiration) Pharyngeal - Thin Straw: Delayed swallow initiation;Premature spillage to pyriform;Reduced epiglottic inversion;Reduced airway/laryngeal closure;Reduced tongue base retraction;Penetration/Aspiration during swallow;Compensatory strategies attempted (Comment) (chin tuck posture; min penetration) Penetration/Aspiration details (thin straw): Material enters airway, remains ABOVE vocal cords and not ejected out Pharyngeal - Solids Pharyngeal - Puree: Not tested Pharyngeal - Regular: Not tested Pharyngeal - Pill: Not tested Cervical Esophageal Phase  Cervical Esophageal Phase Cervical Esophageal Phase: [facility]   Fae Pippin, M.A.,  CCC-SLP [phone] Denay Pleitez 01/02/2011, 11:06 AM

## 2011-01-02 NOTE — Progress Notes (Signed)
Speech Pathology: Dysphagia Treatment Note [place] Note [phone]  Patient was observed with : Mechanical Soft textrues and Nectar liquids.  Patient was noted to have s/s of aspiration : No:    Patient required: Min verbal and visual cues to consistently follow precautions/strategies  Clinical Impression: Pt had MBSS today and recommended Dys. 3 textures with nectar thick liquids via straw and with chin tuck. Pt needed Min verbal and visual cues to utilize chin tuck appropriately.    Recommendations:  Continue current diet and swallowing compensatory strategies.   Pain:   8 /10 Intervention Required:  In Right foot: Pre-medicated and RN notified      Feliberto Gottron, MA, CCC-SLP

## 2011-01-02 NOTE — Progress Notes (Signed)
Physical Therapy Session Note  Patient Details  Name: [name] MRN: [id number] Date of Birth: 1955-12-21  Today's Date: 01/02/2011 Time: 4098-1191 Time Calculation (min): 30 min  Precautions: Precautions Precautions: Fall Precaution Comments: decannulated, monitor O2 during activity Required Braces or Orthoses: No Restrictions Weight Bearing Restrictions: No Other Position/Activity Restrictions: extensor thrust during mobility  Short Term Goals: PT Short Term Goal 1: Patient will consistently perform bed mobility and supine-sit with min assist. PT Short Term Goal 1 - Progress: Progressing toward goal PT Short Term Goal 2: Patient will consistently transfer with mod assist. PT Short Term Goal 2 - Progress: Progressing toward goal PT Short Term Goal 3: Patient will maintain dynamic sitting balance > 10 min with min assist. PT Short Term Goal 3 - Progress: Progressing toward goal PT Short Term Goal 4: Patient will gait 15 feet with max assist. PT Short Term Goal 4 - Progress: Progressing toward goal PT Short Term Goal 5: Patient will propel W/C > 50 feet with min assist. PT Short Term Goal 5 - Progress: Progressing toward goal  Skilled Therapeutic Interventions/Progress Updates: Tx focused on safe mobility and therapeutic exercise. Pt stated R ankle at lateral malleolus painful/sore 8/10; RN aware.  Neuromuscular re-ed trunk and RUE with lateral leans to L x 7, to R x 20, with improving ability during ex to bring trunk up to symmetrical sitting in midline, with VCs.  Bed mobility with min A in flat bed without rails.  Squat/pivot transfer to L with mod A, VCs and manual cues to lean forward to elevate hips.   W/C mobility using both hands with min A for hand placement, steering, demonstrating  improved efficiency for lifting hands off rims briefly to glide, after hand- over- hand demonstration.   W/C<> Kinetron SPT with +2 A for safety, due to pt's tendency to extend  trunk when  standing up. Therapeutic ex on Kinetron at 30-40 cm/sec, x 4 minutes in high sitting, focusing on alternating, full hip extension with each LE.  No c/o increased R ankle pain. Pt returned to room and urged to sit up for a little while before requesting to go back to bed; quick-release safety belt placed around pt; phone and call bell within reach.  PT informed RN that pt was in room.           Therapy/Group: Individual Therapy  Aser Nylund 01/02/2011, 3:01 PM

## 2011-01-02 NOTE — Progress Notes (Signed)
Occupational Therapy Session Note  Patient Details  Name: [name] MRN: [id number] Date of Birth: [birth year]  Today's Date: 01/02/2011 Time: 1030-1125 Time Calculation (min): 55 min  Precautions: Precautions Precautions: Fall Precaution Comments: decannulated, monitor O2 during activity Required Braces or Orthoses: No Restrictions Weight Bearing Restrictions: No Other Position/Activity Restrictions: extensor thrust during mobility  Short Term Goals: OT Short Term Goal 1: Pt will complete bathing with mod assist in seated position OT Short Term Goal 1 - Progress: Progressing toward goal OT Short Term Goal 2: Pt will complete UB dressing with min assist  OT Short Term Goal 2 - Progress: Progressing toward goal OT Short Term Goal 3: Pt will complete LB dressing with max assist in sit to stand position OT Short Term Goal 3 - Progress: Progressing toward goal OT Short Term Goal 4: Pt will demonstrate improved unsupported sitting balance for 10 mins to complete UB bathing and dressing OT Short Term Goal 4 - Progress: Met  Skilled Therapeutic Interventions/Progress Updates:    Pt seen for ADL retraining at sink from w/c level.  Focus on sit to stand, static standing balance, and righting reactions with bending forward.  Pt demonstrated increased ability to bend forward, donning socks by bringing foot up across knee to don sock.  Pt required min verbal and tactile cues to shift weight forward in standing, pt able to pull up pants with support at hips from therapist. Pain Pain Assessment Pain Assessment: No/denies pain  Therapy/Group: Individual Therapy  Leonette Monarch 01/02/2011, 12:21 PM

## 2011-01-03 LAB — GLUCOSE, CAPILLARY: Glucose-Capillary: 170 mg/dL — ABNORMAL HIGH (ref 70–99)

## 2011-01-03 NOTE — Progress Notes (Signed)
Speech Pathology: Dysphagia Treatment Note  Group session  Time: 1130-1155  Patient was observed with : Soft solids and Nectar liquids via straw.  Patient was noted to have s/s of aspiration : No  Lung Sounds:  WNL  Temperature: WNL  Patient required: minimal assist semantic cues to consistently follow precautions/strategies  Clinical Impression: SLP facilitated session with minimal asssit semantic cues to perform a head down posture with nectar-thick single, straw sips and minimal assist to scoop small bites on spoon.  Recommendations:  Continue with current orders and plan of care  Pain:   none Intervention Required:   No  Goals: Progressing  [name], M.A., CCC-SLP [phone]

## 2011-01-03 NOTE — Progress Notes (Signed)
Patient ID: [name], male   DOB: 06/22/1955, 55 y.o.   MRN: [id number] Patient ID: [name], male   DOB: 09-15-1955, 55 y.o.   MRN: [id number] Subjective/Complaints:Speech  much improved, vocal volume much better oriented to person and place.  MBS for this am Review of Systems  HENT: Negative for congestion.        Trach removed  Respiratory: Negative.   Gastrointestinal: Positive for diarrhea. Negative for nausea and abdominal pain.       PEG  Genitourinary: Negative for frequency and hematuria.       Condom cath  All other systems reviewed and are negative.    Objective: Vital Signs: Blood pressure 141/88, pulse 75, temperature 98.5 F (36.9 C), temperature source Oral, resp. rate 20, height 6' (1.829 m), weight 96.5 kg (212 lb 11.9 oz), SpO2 96.00%. Dg Swallowing Func-no Report  01/02/2011  CLINICAL DATA: rule out silent aspiration   FLUOROSCOPY FOR SWALLOWING FUNCTION STUDY:  Fluoroscopy was provided for swallowing function study, which was  administered by a speech pathologist.  Final results and recommendations  from this study are contained within the speech pathology report.     Results for orders placed during the hospital encounter of 12/22/10 (from the past 72 hour(s))  GLUCOSE, CAPILLARY     Status: Abnormal   Collection Time   12/31/10 11:22 AM      Component Value Range Comment   Glucose-Capillary 157 (*) 70 - 99 (mg/dL)   BASIC METABOLIC PANEL     Status: Abnormal   Collection Time   12/31/10  1:08 PM      Component Value Range Comment   Sodium 138  135 - 145 (mEq/L)    Potassium 3.1 (*) 3.5 - 5.1 (mEq/L)    Chloride 96  96 - 112 (mEq/L)    CO2 31  19 - 32 (mEq/L)    Glucose, Bld 160 (*) 70 - 99 (mg/dL)    BUN 12  6 - 23 (mg/dL)    Creatinine, Ser 7.82  0.50 - 1.35 (mg/dL)    Calcium 9.8  8.4 - 10.5 (mg/dL)    GFR calc non Af Amer >90  >90 (mL/min)    GFR calc Af Amer >90  >90 (mL/min)   GLUCOSE, CAPILLARY     Status: Abnormal   Collection Time   12/31/10  4:52 PM      Component Value Range Comment   Glucose-Capillary 172 (*) 70 - 99 (mg/dL)    Comment 1 Notify RN     GLUCOSE, CAPILLARY     Status: Abnormal   Collection Time   12/31/10  8:49 PM      Component Value Range Comment   Glucose-Capillary 168 (*) 70 - 99 (mg/dL)    Comment 1 Notify RN      Comment 2 Repeat Test      Comment 3 Documented in Chart     GLUCOSE, CAPILLARY     Status: Abnormal   Collection Time   01/01/11  7:17 AM      Component Value Range Comment   Glucose-Capillary 143 (*) 70 - 99 (mg/dL)   GLUCOSE, CAPILLARY     Status: Abnormal   Collection Time   01/01/11 11:22 AM      Component Value Range Comment   Glucose-Capillary 150 (*) 70 - 99 (mg/dL)    Comment 1 Notify RN     GLUCOSE, CAPILLARY     Status: Abnormal   Collection Time  01/01/11  4:42 PM      Component Value Range Comment   Glucose-Capillary 136 (*) 70 - 99 (mg/dL)   GLUCOSE, CAPILLARY     Status: Abnormal   Collection Time   01/01/11 10:11 PM      Component Value Range Comment   Glucose-Capillary 207 (*) 70 - 99 (mg/dL)   BASIC METABOLIC PANEL     Status: Abnormal   Collection Time   01/02/11  6:35 AM      Component Value Range Comment   Sodium 138  135 - 145 (mEq/L)    Potassium 3.0 (*) 3.5 - 5.1 (mEq/L)    Chloride 99  96 - 112 (mEq/L)    CO2 28  19 - 32 (mEq/L)    Glucose, Bld 127 (*) 70 - 99 (mg/dL)    BUN 12  6 - 23 (mg/dL)    Creatinine, Ser 1.61  0.50 - 1.35 (mg/dL)    Calcium 9.8  8.4 - 10.5 (mg/dL)    GFR calc non Af Amer >90  >90 (mL/min)    GFR calc Af Amer >90  >90 (mL/min)   GLUCOSE, CAPILLARY     Status: Abnormal   Collection Time   01/02/11 11:13 AM      Component Value Range Comment   Glucose-Capillary 188 (*) 70 - 99 (mg/dL)    Comment 1 Documented in Chart     GLUCOSE, CAPILLARY     Status: Abnormal   Collection Time   01/02/11  4:39 PM      Component Value Range Comment   Glucose-Capillary 159 (*) 70 - 99 (mg/dL)   GLUCOSE, CAPILLARY     Status:  Abnormal   Collection Time   01/02/11  9:06 PM      Component Value Range Comment   Glucose-Capillary 230 (*) 70 - 99 (mg/dL)    Comment 1 Notify RN     GLUCOSE, CAPILLARY     Status: Abnormal   Collection Time   01/03/11  7:40 AM      Component Value Range Comment   Glucose-Capillary 170 (*) 70 - 99 (mg/dL)    Comment 1 Notify RN         .  Patient sitting comfortably in his Bed. No visible secretions of my evaluation.. Patient has weak cough  Trach site with granulation tissue at base, stoma is closed. He is fair oral motor control. No gross cranial nerve abnormalities I can see on exam today. Patient was able to follow simple one-step commands. He communicated with simple yes no and shakes.  He had diminished fine motor movement in all 4 limbs. He may have had some underlying ataxia on the right  Strength was grossly3/5 proximally in both upper limbs. Lower extremity grossly3/5 proximal to3/5 distally. Patient did have sensation to gross pain stimulation. Reflexes are 1+ grossly throughout. Patient had poor insight and and awareness. Heart was irregularly irregular. Chest was clear. Abdomen soft nontender. PEG site was clean and intact with no drainage. Skin throughout was generally intact. Mild edema in all 4 limbs at one plus,@+ LUE.All 4 limbs were warm with 2+ pulses.    Assessment/Plan: 1. Functional deficits secondary to R cerebellar ICH severe R UE and R LE ataxia which require 3+ hours per day of interdisciplinary therapy in a comprehensive inpatient rehab setting.  Team conf today Physiatrist is providing close team supervision and 24 hour management of active medical problems listed below. Physiatrist and rehab team continue to assess barriers  to discharge/monitor patient progress toward functional and medical goals. Mobility: Bed Mobility Bed Mobility: Yes Rolling Right: 2: Max assist;With rail Rolling Left: 3: Mod assist Right Sidelying to Sit: With rails;2: Max  assist Sitting - Scoot to Edge of Bed: 1: +1 Total assist;Other (comment) (significant posterior right lean) Transfers Sit to Stand: 1: +1 Total assist Sit to Stand Details (indicate cue type and reason): significant posterior right lean, slow to initiate movement Stand to Sit: 1: +1 Total assist Stand Pivot Transfer Details (indicate cue type and reason): poor LE eccentric control with uncontrolled descent, significant posterior right lean Squat Pivot Transfers: 1: +2 Total assist Squat Pivot Transfer Details (indicate cue type and reason): total assist + 2 (pt = 50%), poor graded movement and weight shift, slow to initiate with LEs Ambulation/Gait Stairs: No (pre-gait level at this time, unsafe to attempt) Naval architect Mobility: No  ADL:    Cognition: Cognition Overall Cognitive Status: Impaired Arousal/Alertness: Awake/alert Orientation Level: Oriented to person;Oriented to place Attention: Sustained Sustained Attention: Impaired Sustained Attention Impairment: Functional basic Memory: Impaired Memory Impairment: Decreased recall of new information;Decreased long term memory Decreased Long Term Memory: Verbal basic;Functional basic Awareness: Impaired Awareness Impairment: Emergent impairment Problem Solving: Impaired Problem Solving Impairment: Functional basic Executive Function: Self Monitoring;Self Correcting Self Monitoring: Impaired Self Monitoring Impairment: Functional basic Self Correcting: Impaired Self Correcting Impairment: Verbal basic;Functional basic Safety/Judgment: Appears intact Cognition Arousal/Alertness: Awake/alert Orientation Level: Oriented to person;Oriented to place  2. Anticoagulation/DVT prophylaxis with Pharmaceutical:no anticoag use SCDs 3. Pain Management:monitor Patient Active Hospital Problem List: 4.  Afib cannot use warfarin due to ICH Diabetes mellitus (11/19/2010)    POA: Yes 5.  LUE edema, D/C PICC 6.   DM  Lantus monitor CBG 7. Respiratory failure (11/19/2010)    Assessment: trach wean as able reduce to #4 cuffless today    8.Gout (11/20/2010)     9. UTI cipro through sat 12/22 10.  Loose stools likely due to Cipro no watery stools, no other discomfort, good po intake 11.  Hypo K monitor no diuretics 12. AM elevated BP add alpha blocker at noc 12  Rogelia Boga 01/03/2011, 10:07 AM

## 2011-01-03 NOTE — Progress Notes (Signed)
Right cva,left weak. Peg tube ,flushing. Water protocol Dry dsg to trach site.  2+ squat pivot. Incontinent  Of bladder and bowel.Sitting straight up for meals. See fim for bathing and dressing. Continue plan of care.

## 2011-01-03 NOTE — Progress Notes (Signed)
Physical Therapy Note  Patient Details  Name: [name] MRN: [id number] Date of Birth: [birth year] Today's Date: 01/03/2011 Time; 1600-1630 (30') Pain; denies pain Precautions; Fall Risk, Double vision (has eye patch)  Therapeutic Exercise (15') Seated B LE exercises with manual resistance for hip abd/adduction, hip flexion, knee ext/flexion and supine bridging.  Sitting dynamic balance exercises at EOB and focus on postural control.   Therapeutic Activity  (15') Transfer training Bed<->BSC with Mod-A to stand-pivot transfer and  x2 assist for standing and hygiene post BM.   Individual Treatment Session   [name] 01/03/2011, 4:10 PM

## 2011-01-04 LAB — GLUCOSE, CAPILLARY
Glucose-Capillary: 134 mg/dL — ABNORMAL HIGH (ref 70–99)
Glucose-Capillary: 161 mg/dL — ABNORMAL HIGH (ref 70–99)
Glucose-Capillary: 132 mg/dL — ABNORMAL HIGH (ref 70–99)

## 2011-01-04 NOTE — Progress Notes (Signed)
Patient ID: [name], male   DOB: [birth year], 55 y.o.   MRN: [id number] Patient ID: [name], male   DOB: July 11, 1955, 55 y.o.   MRN: [id number] Patient ID: [name], male   DOB: 28-Aug-1955, 55 y.o.   MRN: [id number] Subjective/Complaints:Speech  much improved, vocal volume much better oriented to person and place.  MBS for this am;  No complaints this am Review of Systems  HENT: Negative for congestion.        Trach removed  Respiratory: Negative.   Gastrointestinal: Positive for diarrhea. Negative for nausea and abdominal pain.       PEG  Genitourinary: Negative for frequency and hematuria.       Condom cath  All other systems reviewed and are negative.    Objective: Vital Signs: Blood pressure 143/87, pulse 84, temperature 98.9 F (37.2 C), temperature source Oral, resp. rate 19, height 6' (1.829 m), weight 97.6 kg (215 lb 2.7 oz), SpO2 100.00%. Dg Swallowing Func-no Report  01/02/2011  CLINICAL DATA: rule out silent aspiration   FLUOROSCOPY FOR SWALLOWING FUNCTION STUDY:  Fluoroscopy was provided for swallowing function study, which was  administered by a speech pathologist.  Final results and recommendations  from this study are contained within the speech pathology report.     Results for orders placed during the hospital encounter of 12/22/10 (from the past 72 hour(s))  GLUCOSE, CAPILLARY     Status: Abnormal   Collection Time   01/01/11 11:22 AM      Component Value Range Comment   Glucose-Capillary 150 (*) 70 - 99 (mg/dL)    Comment 1 Notify RN     GLUCOSE, CAPILLARY     Status: Abnormal   Collection Time   01/01/11  4:42 PM      Component Value Range Comment   Glucose-Capillary 136 (*) 70 - 99 (mg/dL)   GLUCOSE, CAPILLARY     Status: Abnormal   Collection Time   01/01/11 10:11 PM      Component Value Range Comment   Glucose-Capillary 207 (*) 70 - 99 (mg/dL)   BASIC METABOLIC PANEL     Status: Abnormal   Collection Time   01/02/11  6:35 AM   Component Value Range Comment   Sodium 138  135 - 145 (mEq/L)    Potassium 3.0 (*) 3.5 - 5.1 (mEq/L)    Chloride 99  96 - 112 (mEq/L)    CO2 28  19 - 32 (mEq/L)    Glucose, Bld 127 (*) 70 - 99 (mg/dL)    BUN 12  6 - 23 (mg/dL)    Creatinine, Ser 0.93  0.50 - 1.35 (mg/dL)    Calcium 9.8  8.4 - 10.5 (mg/dL)    GFR calc non Af Amer >90  >90 (mL/min)    GFR calc Af Amer >90  >90 (mL/min)   GLUCOSE, CAPILLARY     Status: Abnormal   Collection Time   01/02/11 11:13 AM      Component Value Range Comment   Glucose-Capillary 188 (*) 70 - 99 (mg/dL)    Comment 1 Documented in Chart     GLUCOSE, CAPILLARY     Status: Abnormal   Collection Time   01/02/11  4:39 PM      Component Value Range Comment   Glucose-Capillary 159 (*) 70 - 99 (mg/dL)   GLUCOSE, CAPILLARY     Status: Abnormal   Collection Time   01/02/11  9:06 PM  Component Value Range Comment   Glucose-Capillary 230 (*) 70 - 99 (mg/dL)    Comment 1 Notify RN     GLUCOSE, CAPILLARY     Status: Abnormal   Collection Time   01/03/11  7:40 AM      Component Value Range Comment   Glucose-Capillary 170 (*) 70 - 99 (mg/dL)    Comment 1 Notify RN     GLUCOSE, CAPILLARY     Status: Abnormal   Collection Time   01/03/11 11:31 AM      Component Value Range Comment   Glucose-Capillary 183 (*) 70 - 99 (mg/dL)    Comment 1 Notify RN     GLUCOSE, CAPILLARY     Status: Abnormal   Collection Time   01/03/11  4:21 PM      Component Value Range Comment   Glucose-Capillary 127 (*) 70 - 99 (mg/dL)    Comment 1 Notify RN     GLUCOSE, CAPILLARY     Status: Abnormal   Collection Time   01/03/11  9:19 PM      Component Value Range Comment   Glucose-Capillary 181 (*) 70 - 99 (mg/dL)   GLUCOSE, CAPILLARY     Status: Abnormal   Collection Time   01/04/11  7:35 AM      Component Value Range Comment   Glucose-Capillary 134 (*) 70 - 99 (mg/dL)    Comment 1 Notify RN         .  Patient sitting comfortably in his Bed. No visible  secretions of my evaluation.. Patient has weak cough  Trach site with granulation tissue at base, stoma is closed. He is fair oral motor control. No gross cranial nerve abnormalities I can see on exam today. Patient was able to follow simple one-step commands. He communicated with simple yes no and shakes.  He had diminished fine motor movement in all 4 limbs. He may have had some underlying ataxia on the right  Strength was grossly3/5 proximally in both upper limbs. Lower extremity grossly3/5 proximal to3/5 distally. Patient did have sensation to gross pain stimulation. Reflexes are 1+ grossly throughout. Patient had poor insight and and awareness. Heart was irregularly irregular. Chest was clear. Abdomen soft nontender. PEG site was clean and intact with no drainage. Skin throughout was generally intact. Mild edema in all 4 limbs at one plus,@+ LUE.All 4 limbs were warm with 2+ pulses.    Assessment/Plan: 1. Functional deficits secondary to R cerebellar ICH severe R UE and R LE ataxia which require 3+ hours per day of interdisciplinary therapy in a comprehensive inpatient rehab setting.  Team conf today Physiatrist is providing close team supervision and 24 hour management of active medical problems listed below. Physiatrist and rehab team continue to assess barriers to discharge/monitor patient progress toward functional and medical goals. Mobility: Bed Mobility Bed Mobility: Yes Rolling Right: 2: Max assist;With rail Rolling Left: 3: Mod assist Right Sidelying to Sit: With rails;2: Max assist Sitting - Scoot to Edge of Bed: 1: +1 Total assist;Other (comment) (significant posterior right lean) Transfers Sit to Stand: 1: +1 Total assist Sit to Stand Details (indicate cue type and reason): significant posterior right lean, slow to initiate movement Stand to Sit: 1: +1 Total assist Stand Pivot Transfer Details (indicate cue type and reason): poor LE eccentric control with uncontrolled descent,  significant posterior right lean Squat Pivot Transfers: 1: +2 Total assist Squat Pivot Transfer Details (indicate cue type and reason): total assist + 2 (pt =  50%), poor graded movement and weight shift, slow to initiate with LEs Ambulation/Gait Stairs: No (pre-gait level at this time, unsafe to attempt) Wheelchair Mobility Wheelchair Mobility: No  ADL:    Cognition: Cognition Overall Cognitive Status: Impaired Arousal/Alertness: Awake/alert Orientation Level: Oriented to person Attention: Sustained Sustained Attention: Impaired Sustained Attention Impairment: Functional basic Memory: Impaired Memory Impairment: Decreased recall of new information;Decreased long term memory Decreased Long Term Memory: Verbal basic;Functional basic Awareness: Impaired Awareness Impairment: Emergent impairment Problem Solving: Impaired Problem Solving Impairment: Functional basic Executive Function: Self Monitoring;Self Correcting Self Monitoring: Impaired Self Monitoring Impairment: Functional basic Self Correcting: Impaired Self Correcting Impairment: Verbal basic;Functional basic Safety/Judgment: Appears intact Cognition Arousal/Alertness: Awake/alert Orientation Level: Oriented to person  2. Anticoagulation/DVT prophylaxis with Pharmaceutical:no anticoag use SCDs 3. Pain Management:monitor Patient Active Hospital Problem List: 4.  Afib cannot use warfarin due to ICH Diabetes mellitus (11/19/2010)    POA: Yes 5.  LUE edema, D/C PICC 6.   DM Lantus monitor CBG 7. Respiratory failure (11/19/2010)    Assessment: trach wean as able reduce to #4 cuffless today    8.Gout (11/20/2010)     9. UTI cipro through sat 12/22 10.  Loose stools likely due to Cipro no watery stools, no other discomfort, good po intake 11.  Hypo K monitor no diuretics 12. AM elevated BP add alpha blocker at noc 13  Rogelia Boga 01/04/2011, 8:46 AM

## 2011-01-04 NOTE — Progress Notes (Signed)
Alert and oriented slow to respond. Weak on left 2+ squat pivot transfer.old trach site with dry dressing. Peg tube .Inconteint bowel and bladder .alternating eye patch. Water protocol after oral hygiene .  Bathing and dressing with therapy. Continue plan of care.

## 2011-01-04 NOTE — Progress Notes (Signed)
Occupational Therapy Session Note  Patient Details  Name: [name] MRN: [id number] Date of Birth: 05/05/1955  Today's Date: 01/04/2011 Time: 4098-1191 Time Calculation (min): 32 min  Precautions: Precautions Precautions: Fall Precaution Comments: decannulated, monitor O2 during activity Required Braces or Orthoses: No Restrictions Weight Bearing Restrictions: No Other Position/Activity Restrictions: extensor thrust during mobility  Skilled Therapeutic Interventions/Progress Updates: Patient seen this am for OT bathing and dressing at sink.  Significant improvement noted in hios ability to transition from sit to stand in midline, however, still challenged with UE's out of support to pull up clothing, etc.  Patient continues with posterior lean, and no awareness.  Patient requires less assistance for static standing (min/mod) and now is able to lift arms from support for 5 seconds, one arm at a time.    Pain:  Patient reports no pain. When asked, indicated right foot was feeling better      Therapy/Group: Individual Therapy  [name] 01/04/2011, 11:44 AM

## 2011-01-05 LAB — BASIC METABOLIC PANEL WITH GFR
BUN: 11 mg/dL (ref 6–23)
CO2: 26 meq/L (ref 19–32)
Calcium: 10 mg/dL (ref 8.4–10.5)
Chloride: 101 meq/L (ref 96–112)
Creatinine, Ser: 1.15 mg/dL (ref 0.50–1.35)
GFR calc Af Amer: 80 mL/min — ABNORMAL LOW (ref >90)
GFR calc non Af Amer: 69 mL/min — ABNORMAL LOW (ref >90)
Glucose, Bld: 171 mg/dL — ABNORMAL HIGH (ref 70–99)
Potassium: 4.4 meq/L (ref 3.5–5.1)
Sodium: 138 meq/L (ref 135–145)

## 2011-01-05 LAB — GLUCOSE, CAPILLARY
Glucose-Capillary: 146 mg/dL — ABNORMAL HIGH (ref 70–99)
Glucose-Capillary: 164 mg/dL — ABNORMAL HIGH (ref 70–99)
Glucose-Capillary: 129 mg/dL — ABNORMAL HIGH (ref 70–99)
Glucose-Capillary: 179 mg/dL — ABNORMAL HIGH (ref 70–99)

## 2011-01-05 MED ORDER — POTASSIUM CHLORIDE CRYS ER 20 MEQ PO TBCR
20.0000 meq | EXTENDED_RELEASE_TABLET | Freq: Every day | ORAL | Status: DC
  Administered 2011-01-06 – 2011-01-09 (×4): 20 meq via ORAL
  Filled 2011-01-05 (×5): qty 1

## 2011-01-05 NOTE — Progress Notes (Signed)
Speech Pathology: Dysphagia Treatment Note  Individual Session  Time: 7829-5621  Patient was observed with : (the water protocol) Thin liquids.  Patient was noted to have s/s of aspiration : No  Lung Sounds:  WNL Temperature: WNL  Patient required: supervision cues to consistently follow precautions/strategies  Clinical Impression: SLP facilitated session with set up assist then patient performed oral care with suctioning and supervision semantic cues for thoroughness.  During his trials of thin liquids via straw SLP facilitated with semantic cues for through chin tuck through the completion of sips.      Recommendations:  Continue with current diet restriction secondary to silent penetration and aspiration observed on pervious study.  Pain:   none Intervention Required:   No  Goals: Progressing   Fae Pippin, M.A., CCC-SLP [phone]

## 2011-01-05 NOTE — Progress Notes (Signed)
Speech Pathology: Dysphagia Treatment Note  Group Session  Time: 1130-1140  Patient was observed with : Mechanical Soft  and Nectar liquids via straw.  Patient was noted to have s/s of aspiration : No  Lung Sounds:  WNL Temperature: WNL  Patient required: supervision semantic cues cues to consistently follow precautions/strategies  Clinical Impression: SLP facilitated safety with p.o. Intake with supervision semantic cues to utilize chin tuck with all bites and sips and to use consume small bites  Recommendations:  Continue with current plan of care  Pain:   none Intervention Required:   No  Goals: Progressing  Fae Pippin, M.A., CCC-SLP [phone]

## 2011-01-05 NOTE — Progress Notes (Signed)
Occupational Therapy Note  Patient Details  Name: [name] MRN: [id number] Date of Birth: 10/24/1955 Today's Date: 01/05/2011  Skilled Intervention  Time: 1140-1155 Pt denies pain Group Therapy  Pt participated in self feeding group with focus on self feeding utilizing weighted and nonweighted utensils to assist with feeding.  Pt using BUE/hands to hold desert cup and drinking cup.  Pt required min verbal cues to follow swallowing strategies.   Lavone Neri [facility] 01/05/2011, 12:07 PM

## 2011-01-05 NOTE — Progress Notes (Signed)
Physical Therapy Weekly Progress Note  Patient Details  Name: [name] MRN: [id number] Date of Birth: 1955/10/24  Today's Date: 01/05/2011  Patient continues to make excellent progress with PT this week, from max assist transfers, max assist pre-gait activities, and no W/C mobility to current mod assist transfers, mod assist pre-gait activities, and min assist W/C mobility. Gait has also been initiated this week due to improved standing balance with decreased posterior lean and improved activity tolerance. Short term gait goal not met at this time due to LE ataxia, decreased perception of midline in standing, decreased emergent awareness of LOB and body position in standing, and decreased postural control in dynamic standing, patient requires total assist + 2, patient = 25% for gait at this time. Otherwise, all short term goals for the week have been met.  Patient will continue to benefit from skilled PT intervention to enhance overall performance with activity tolerance, balance, postural control, ability to compensate for deficits, functional use of  left upper extremity and left lower extremity, awareness and coordination.  Patient progressing toward long term goals..  Continue plan of care.  PT Short Term Goals PT Short Term Goal 1: Patient will consistently perform bed mobility and supine-sit with min assist. PT Short Term Goal 1 - Progress: Met PT Short Term Goal 2: Patient will consistently transfer with mod assist. PT Short Term Goal 2 - Progress: Met PT Short Term Goal 3: Patient will maintain dynamic sitting balance > 10 min with min assist. PT Short Term Goal 3 - Progress: Met PT Short Term Goal 4: Patient will gait 15 feet with max assist. PT Short Term Goal 4 - Progress: Progressing toward goal PT Short Term Goal 5: Patient will propel W/C > 50 feet with min assist. PT Short Term Goal 5 - Progress: Met  Therapy Documentation Precautions: Precautions Precautions:  Fall Precaution Comments: decannulated, monitor O2 during activity Required Braces or Orthoses: No Restrictions Weight Bearing Restrictions: No Other Position/Activity Restrictions: extensor thrust during mobility   [name] 01/05/2011, 12:23 PM

## 2011-01-05 NOTE — Progress Notes (Signed)
Patient ID: [name], male   DOB: 04/22/1955, 55 y.o.   MRN: [id number] Subjective/Complaints:Speech  much improved, vocal volume much better oriented to person and place.  MBS for this am Review of Systems  HENT: Negative for congestion.        Trach removed  Respiratory: Negative.   Gastrointestinal: Positive for diarrhea. Negative for nausea and abdominal pain.       PEG  Genitourinary: Negative for frequency and hematuria.       Condom cath  All other systems reviewed and are negative.    Objective: Vital Signs: Blood pressure 126/79, pulse 58, temperature 97.7 F (36.5 C), temperature source Oral, resp. rate 19, height 6' (1.829 m), weight 95.1 kg (209 lb 10.5 oz), SpO2 99.00%. No results found. Results for orders placed during the hospital encounter of 12/22/10 (from the past 72 hour(s))  GLUCOSE, CAPILLARY     Status: Abnormal   Collection Time   01/02/11 11:13 AM      Component Value Range Comment   Glucose-Capillary 188 (*) 70 - 99 (mg/dL)    Comment 1 Documented in Chart     GLUCOSE, CAPILLARY     Status: Abnormal   Collection Time   01/02/11  4:39 PM      Component Value Range Comment   Glucose-Capillary 159 (*) 70 - 99 (mg/dL)   GLUCOSE, CAPILLARY     Status: Abnormal   Collection Time   01/02/11  9:06 PM      Component Value Range Comment   Glucose-Capillary 230 (*) 70 - 99 (mg/dL)    Comment 1 Notify RN     GLUCOSE, CAPILLARY     Status: Abnormal   Collection Time   01/03/11  7:40 AM      Component Value Range Comment   Glucose-Capillary 170 (*) 70 - 99 (mg/dL)    Comment 1 Notify RN     GLUCOSE, CAPILLARY     Status: Abnormal   Collection Time   01/03/11 11:31 AM      Component Value Range Comment   Glucose-Capillary 183 (*) 70 - 99 (mg/dL)    Comment 1 Notify RN     GLUCOSE, CAPILLARY     Status: Abnormal   Collection Time   01/03/11  4:21 PM      Component Value Range Comment   Glucose-Capillary 127 (*) 70 - 99 (mg/dL)    Comment 1 Notify RN      GLUCOSE, CAPILLARY     Status: Abnormal   Collection Time   01/03/11  9:19 PM      Component Value Range Comment   Glucose-Capillary 181 (*) 70 - 99 (mg/dL)   GLUCOSE, CAPILLARY     Status: Abnormal   Collection Time   01/04/11  7:35 AM      Component Value Range Comment   Glucose-Capillary 134 (*) 70 - 99 (mg/dL)    Comment 1 Notify RN     GLUCOSE, CAPILLARY     Status: Abnormal   Collection Time   01/04/11 11:53 AM      Component Value Range Comment   Glucose-Capillary 161 (*) 70 - 99 (mg/dL)    Comment 1 Notify RN     GLUCOSE, CAPILLARY     Status: Abnormal   Collection Time   01/04/11  4:47 PM      Component Value Range Comment   Glucose-Capillary 132 (*) 70 - 99 (mg/dL)    Comment 1 Notify RN  GLUCOSE, CAPILLARY     Status: Abnormal   Collection Time   01/04/11  9:01 PM      Component Value Range Comment   Glucose-Capillary 164 (*) 70 - 99 (mg/dL)   GLUCOSE, CAPILLARY     Status: Abnormal   Collection Time   01/05/11  7:25 AM      Component Value Range Comment   Glucose-Capillary 146 (*) 70 - 99 (mg/dL)       .  Patient sitting comfortably in his Bed. Patient has weak cough  Trach site with granulation tissue at base, stoma is closed. He is fair oral motor control. No gross cranial nerve abnormalities I can see on exam today. Patient was able to follow simple one-step commands. He communicated with simple yes no and shakes.  He had diminished fine motor movement in all 4 limbs. He may have had some underlying ataxia on the right  Strength was grossly3/5 proximally in both upper limbs. Lower extremity grossly3/5 proximal to3/5 distally. Patient did have sensation to gross pain stimulation. Reflexes are 1+ grossly throughout. Patient had poor insight and and awareness. Heart was irregularly irregular. Chest was clear. Abdomen soft nontender. PEG site was clean and intact with no drainage. Skin throughout was generally intact. Mild edema in all 4 limbs at one plus,@+  LUE.All 4 limbs were warm with 2+ pulses.    Assessment/Plan: 1. Functional deficits secondary to R cerebellar ICH severe R UE and R LE ataxia which require 3+ hours per day of interdisciplinary therapy in a comprehensive inpatient rehab setting.  Team conf today Physiatrist is providing close team supervision and 24 hour management of active medical problems listed below. Physiatrist and rehab team continue to assess barriers to discharge/monitor patient progress toward functional and medical goals. Mobility: Bed Mobility Bed Mobility: Yes Rolling Right: 2: Max assist;With rail Rolling Left: 3: Mod assist Right Sidelying to Sit: With rails;2: Max assist Sitting - Scoot to Edge of Bed: 1: +1 Total assist;Other (comment) (significant posterior right lean) Transfers Sit to Stand: 1: +1 Total assist Sit to Stand Details (indicate cue type and reason): significant posterior right lean, slow to initiate movement Stand to Sit: 1: +1 Total assist Stand Pivot Transfer Details (indicate cue type and reason): poor LE eccentric control with uncontrolled descent, significant posterior right lean Squat Pivot Transfers: 1: +2 Total assist Squat Pivot Transfer Details (indicate cue type and reason): total assist + 2 (pt = 50%), poor graded movement and weight shift, slow to initiate with LEs Ambulation/Gait Stairs: No (pre-gait level at this time, unsafe to attempt) Naval architect Mobility: No  ADL:    Cognition: Cognition Overall Cognitive Status: Impaired Arousal/Alertness: Awake/alert Orientation Level: Oriented to person Attention: Sustained Sustained Attention: Impaired Sustained Attention Impairment: Functional basic Memory: Impaired Memory Impairment: Decreased recall of new information;Decreased long term memory Decreased Long Term Memory: Verbal basic;Functional basic Awareness: Impaired Awareness Impairment: Emergent impairment Problem Solving: Impaired Problem  Solving Impairment: Functional basic Executive Function: Self Monitoring;Self Correcting Self Monitoring: Impaired Self Monitoring Impairment: Functional basic Self Correcting: Impaired Self Correcting Impairment: Verbal basic;Functional basic Safety/Judgment: Appears intact Cognition Arousal/Alertness: Awake/alert Orientation Level: Oriented to person  2. Anticoagulation/DVT prophylaxis with Pharmaceutical:no anticoag use SCDs 3. Pain Management:monitor Patient Active Hospital Problem List: 4.  Afib cannot use warfarin due to ICH Diabetes mellitus (11/19/2010)    POA: Yes 5.  LUE edema, D/C PICC 6.   DM Lantus monitor CBG 7. Respiratory failure (11/19/2010)   decannulated  8.Gout (11/20/2010)      9..  Hypo K monitor no diuretics 10.  HTN controlled 14  KIRSTEINS,ANDREW E 01/05/2011, 8:45 AM

## 2011-01-05 NOTE — Progress Notes (Signed)
Physical Therapy Session Note  Patient Details  Name: [name] MRN: [id number] Date of Birth: 1955-01-27  Today's Date: 01/05/2011 Time: 0865-7846 Time Calculation (min): 60 min  Precautions: Precautions Precautions: Fall Precaution Comments: monitor O2 during activity Required Braces or Orthoses: No Restrictions Weight Bearing Restrictions: No Other Position/Activity Restrictions: posterior lean  Short Term Goals: PT Short Term Goal 1: Patient will perform bed mobility and supine-sit with supervision. PT Short Term Goal 1 - Progress: Progressing toward goal PT Short Term Goal 2: Patient will transfer with min assist 3 of 5 trials. PT Short Term Goal 2 - Progress: Progressing toward goal PT Short Term Goal 3: Patient will maintain dynamic standing balance with min assist > 5 min for ADL task. PT Short Term Goal 3 - Progress: Progressing toward goal PT Short Term Goal 4: Patient will gait 15 feet with max assist. PT Short Term Goal 4 - Progress: Progressing toward goal PT Short Term Goal 5: Patient will propel W/C > 100 feet with min assist. PT Short Term Goal 5 - Progress: Progressing toward goal  Skilled Therapeutic Interventions/Progress Updates:     General Chart Reviewed: Yes Family/Caregiver Present: Yes (brother Molly Maduro)   Pain Pain Assessment Pain Assessment: No/denies pain Pain Score: 0-No pain  Other Treatments  W/C mobility x 100 feet with bilat UEs and LEs, focus on smoothness and timing of movement in open chain to improve coordination and sequencing. Squat-pivot transfers mod assist, focus on anterior weight shift and LE position to increase functional independence. Standing pre-gait activities with RW and maxi sky for safety, facilitation for anterior left weight shift to orient to and maintain midline and achieve center of gravity over BOS. Patient tends to have significant posterior right lean with decreased emergent awareness of it. Used mirror for visual  feedback with some success, patient able to see and verbalize he is leaning, however unable to initiate self correction without manual facilitation. Standing dynamic cross body reaching forward and up, manual facilitation for anterior weight shift and trunk rotation to improve grading of lateral weight shifts. Gait trial with RW and maxi sky for safety with total assist due to increased posterior lean and scissoring of right LE across midline. Gait trial with Carley Hammed walker and maxi sky for safety max assist 25 feet x 2, manual facilitation at rib cage to increase anterior weight shift and improve progression of center of gravity over BOS in bilat stance phase, mirror for visual feedback with verbal cues for right LE placement more laterally to increase BOS and stability.  Therapy/Group: Individual Therapy  [name] 01/05/2011, 3:08 PM

## 2011-01-05 NOTE — Progress Notes (Signed)
Occupational Therapy Weekly Progress Note  Patient Details  Name: [name] MRN: [id number] Date of Birth: Mar 16, 1955  Today's Date: 01/05/2011 Time: 0900-1025 Time Calculation (min): 85 min  Patient has met 4 of 4 short term goals secondary to improved sitting balance, improved ability to transition to stand, improved stand balance with UE support, and improved activity tolerance.  Patient decanulated this week.  He has maintained O2 sats at 95% or better with activity.    Patient continues to demonstrate the following deficits: decreased awareness of midline, decreased UE/LE coordination,  and therefore will continue to benefit from skilled OT intervention to enhance overall performance with ADL.  Patient progressing toward long term goals..  Continue plan of care.  OT Short Term Goals OT Short Term Goal 1: Pt will complete bathing with mod assist in seated position OT Short Term Goal 1 - Progress: Met New OT Short term goal 1:  Patient will complete bathing with min assist (sit to stand)  OT Short Term Goal 2: Pt will complete UB dressing with min assist  OT Short Term Goal 2 - Progress: Met OT Short term Goal 2 = Long term goal  OT Short Term Goal 3: Pt will complete LB dressing with max assist in sit to stand position OT Short Term Goal 3 - Progress: Met New OT Short Term Goal 2 -Pt will complete LB dressing with mod assist in sit to stand  OT Short Term Goal 4: Pt will demonstrate improved unsupported sitting balance for 10 mins to complete UB bathing and dressing OT Short Term Goal 4 - Progress: Met  New ST goal 3:  Patient will transfer to and from commode with min assist New ST goal 4:  Patient will complete toileting with min assist.    Skilled Therapeutic Interventions/Progress Updates:    Patient seen this am for bathing and dressing at shower level.  Patient able to transfer to and from shower bench in ADL apartment with mod assist (squat pivot).  Patient able  to stand to wash periarea using grab bar for support.  Focus of therapy this week has been to transition from sitting to standing, and from standing to sitting with improved control, and decreased assistance.  Patient is improving in his ability to maintain standing as long as both upper extremities are in a position of support.  This however, is not very functional, therefore the new focus is to free hands from support to allow daily living skills.  Diplopia persists.  To compensate, patient is wearing an eye patch and alternating eyes.  He has also identified right lower quadrant as area with occasional single vision.    General General Chart Reviewed: Yes    Pain Pain Assessment Pain Assessment: No/denies pain    Therapy/Group: Individual Therapy  [name] 01/05/2011, 10:47 AM

## 2011-01-05 NOTE — Progress Notes (Addendum)
Patient transfers 2+squat pivot mod assist to right side. Left side weakness. Double vision, wears eye patch alternating eyes during day. Full supervision with meals, D3 nectar thick, water protocol. Meds whole in puree, PEG flushed, free water 100 cc given, no meds or nutrition given through PEG. Patient eating 75-100% of meals. LBM 12/24 on bedpan. Incontinence of voiding in brief. Some complaints of R foot pain after activity in therapy, patient expressed relief with prn 650 mg Tylenol. Dry dressing changed on trach removal site, wound clean dry intact. Will continue with plan of care.

## 2011-01-06 DIAGNOSIS — Z5189 Encounter for other specified aftercare: Secondary | ICD-10-CM

## 2011-01-06 DIAGNOSIS — J9600 Acute respiratory failure, unspecified whether with hypoxia or hypercapnia: Secondary | ICD-10-CM

## 2011-01-06 DIAGNOSIS — I69993 Ataxia following unspecified cerebrovascular disease: Secondary | ICD-10-CM

## 2011-01-06 DIAGNOSIS — I6340 Cerebral infarction due to embolism of unspecified cerebral artery: Secondary | ICD-10-CM

## 2011-01-06 LAB — GLUCOSE, CAPILLARY
Glucose-Capillary: 138 mg/dL — ABNORMAL HIGH (ref 70–99)
Glucose-Capillary: 165 mg/dL — ABNORMAL HIGH (ref 70–99)
Glucose-Capillary: 166 mg/dL — ABNORMAL HIGH (ref 70–99)

## 2011-01-06 NOTE — Progress Notes (Signed)
Patient ID: NIKOLAOS MADDOCKS, male   DOB: May 22, 1955, 55 y.o.   MRN: [id number] Patient ID: ZYKEE AVAKIAN, male   DOB: [birth year], 55 y.o.   MRN: [id number] Subjective/ComplaintsReview of Systems  HENT: Negative for congestion.        Trach removed  Respiratory: Negative.   Gastrointestinal: Positive for diarrhea. Negative for nausea and abdominal pain.       PEG  Genitourinary: Negative for frequency and hematuria.       Condom cath  All other systems reviewed and are negative.   Right foot pain? Poor sleep? Patient is a poor historian Objective: Vital Signs: Blood pressure 161/91, pulse 92, temperature 98.4 F (36.9 C), temperature source Oral, resp. rate 20, height 6' (1.829 m), weight 95.1 kg (209 lb 10.5 oz), SpO2 96.00%. No results found. Results for orders placed during the hospital encounter of 12/22/10 (from the past 72 hour(s))  GLUCOSE, CAPILLARY     Status: Abnormal   Collection Time   01/03/11  7:40 AM      Component Value Range Comment   Glucose-Capillary 170 (*) 70 - 99 (mg/dL)    Comment 1 Notify RN     GLUCOSE, CAPILLARY     Status: Abnormal   Collection Time   01/03/11 11:31 AM      Component Value Range Comment   Glucose-Capillary 183 (*) 70 - 99 (mg/dL)    Comment 1 Notify RN     GLUCOSE, CAPILLARY     Status: Abnormal   Collection Time   01/03/11  4:21 PM      Component Value Range Comment   Glucose-Capillary 127 (*) 70 - 99 (mg/dL)    Comment 1 Notify RN     GLUCOSE, CAPILLARY     Status: Abnormal   Collection Time   01/03/11  9:19 PM      Component Value Range Comment   Glucose-Capillary 181 (*) 70 - 99 (mg/dL)   GLUCOSE, CAPILLARY     Status: Abnormal   Collection Time   01/04/11  7:35 AM      Component Value Range Comment   Glucose-Capillary 134 (*) 70 - 99 (mg/dL)    Comment 1 Notify RN     GLUCOSE, CAPILLARY     Status: Abnormal   Collection Time   01/04/11 11:53 AM      Component Value Range Comment   Glucose-Capillary 161 (*) 70 - 99  (mg/dL)    Comment 1 Notify RN     GLUCOSE, CAPILLARY     Status: Abnormal   Collection Time   01/04/11  4:47 PM      Component Value Range Comment   Glucose-Capillary 132 (*) 70 - 99 (mg/dL)    Comment 1 Notify RN     GLUCOSE, CAPILLARY     Status: Abnormal   Collection Time   01/04/11  9:01 PM      Component Value Range Comment   Glucose-Capillary 164 (*) 70 - 99 (mg/dL)   GLUCOSE, CAPILLARY     Status: Abnormal   Collection Time   01/05/11  7:25 AM      Component Value Range Comment   Glucose-Capillary 146 (*) 70 - 99 (mg/dL)   GLUCOSE, CAPILLARY     Status: Abnormal   Collection Time   01/05/11 11:27 AM      Component Value Range Comment   Glucose-Capillary 164 (*) 70 - 99 (mg/dL)   BASIC METABOLIC PANEL     Status: Abnormal  Collection Time   01/05/11 12:34 PM      Component Value Range Comment   Sodium 138  135 - 145 (mEq/L)    Potassium 4.4  3.5 - 5.1 (mEq/L)    Chloride 101  96 - 112 (mEq/L)    CO2 26  19 - 32 (mEq/L)    Glucose, Bld 171 (*) 70 - 99 (mg/dL)    BUN 11  6 - 23 (mg/dL)    Creatinine, Ser 1.61  0.50 - 1.35 (mg/dL)    Calcium 09.6  8.4 - 10.5 (mg/dL)    GFR calc non Af Amer 69 (*) >90 (mL/min)    GFR calc Af Amer 80 (*) >90 (mL/min)   GLUCOSE, CAPILLARY     Status: Abnormal   Collection Time   01/05/11  4:47 PM      Component Value Range Comment   Glucose-Capillary 129 (*) 70 - 99 (mg/dL)    Comment 1 Notify RN     GLUCOSE, CAPILLARY     Status: Abnormal   Collection Time   01/05/11  9:31 PM      Component Value Range Comment   Glucose-Capillary 179 (*) 70 - 99 (mg/dL)    Comment 1 Notify RN         .  Patient sitting comfortably in his Bed. Patient has weak cough  Trach site with granulation tissue at base, stoma is closed. He is fair oral motor control. No gross cranial nerve abnormalities I can see on exam today. Patient was able to follow simple one-step commands. He communicated with simple yes no and shakes.  He had diminished fine motor  movement in all 4 limbs. He may have had some underlying ataxia on the right  Strength was grossly3/5 proximally in both upper limbs. Lower extremity grossly3/5 proximal to3/5 distally. Patient did have sensation to gross pain stimulation. Reflexes are 1+ grossly throughout. Patient had poor insight and and awareness. Heart was irregularly irregular. Chest was clear. Abdomen soft nontender. PEG site was clean and intact with no drainage. Skin throughout was generally intact. Mild edema in all 4 limbs at one plus,@+ LUE.All 4 limbs were warm with 2+ pulses.    Assessment/Plan: 1. Functional deficits secondary to R cerebellar ICH severe R UE and R LE ataxia which require 3+ hours per day of interdisciplinary therapy in a comprehensive inpatient rehab setting.  Team conf today Physiatrist is providing close team supervision and 24 hour management of active medical problems listed below. Physiatrist and rehab team continue to assess barriers to discharge/monitor patient progress toward functional and medical goals. Mobility: Bed Mobility Bed Mobility: Yes Rolling Right: 2: Max assist;With rail Rolling Left: 3: Mod assist Right Sidelying to Sit: With rails;2: Max assist Sitting - Scoot to Edge of Bed: 1: +1 Total assist;Other (comment) (significant posterior right lean) Transfers Sit to Stand: 1: +1 Total assist Sit to Stand Details (indicate cue type and reason): significant posterior right lean, slow to initiate movement Stand to Sit: 1: +1 Total assist Stand Pivot Transfer Details (indicate cue type and reason): poor LE eccentric control with uncontrolled descent, significant posterior right lean Squat Pivot Transfers: 1: +2 Total assist Squat Pivot Transfer Details (indicate cue type and reason): total assist + 2 (pt = 50%), poor graded movement and weight shift, slow to initiate with LEs Ambulation/Gait Stairs: No (pre-gait level at this time, unsafe to attempt) Wheelchair Mobility Wheelchair  Mobility: No  ADL:    Cognition: Cognition Overall Cognitive Status: Impaired  Arousal/Alertness: Awake/alert Orientation Level: Oriented to person;Oriented to place Attention: Sustained Sustained Attention: Impaired Sustained Attention Impairment: Functional basic Memory: Impaired Memory Impairment: Decreased recall of new information;Decreased long term memory Decreased Long Term Memory: Verbal basic;Functional basic Awareness: Impaired Awareness Impairment: Emergent impairment Problem Solving: Impaired Problem Solving Impairment: Functional basic Executive Function: Self Monitoring;Self Correcting Self Monitoring: Impaired Self Monitoring Impairment: Functional basic Self Correcting: Impaired Self Correcting Impairment: Verbal basic;Functional basic Safety/Judgment: Appears intact Cognition Arousal/Alertness: Awake/alert Orientation Level: Oriented to person;Oriented to place  2. Anticoagulation/DVT prophylaxis with Pharmaceutical:no anticoag use SCDs 3. Pain Management:monitor, tylenol for non-specific right foot pain. Patient Active Hospital Problem List: 4.  Afib cannot use warfarin due to ICH Diabetes mellitus (11/19/2010)    POA: Yes 5.  LUE edema, D/C PICC 6.   DM Lantus monitor CBG--good control at present. 7. Respiratory failure (11/19/2010)   decannulated    8.Gout (11/20/2010) -may be behind some of foot pain.  Foot minimally tender on exam.  Check uric acid level tomorrow     9..  Hypo K monitor no diuretics 10.  HTN controlled 15  Halli Equihua T 01/06/2011, 5:38 AM

## 2011-01-06 NOTE — Progress Notes (Signed)
Pt transfers with a mod assist stand pivot to chair.  Pt denies pain,  Old trach site healing.  Left sided weakness noted,  Full supervision provided with meals,  PEG tube patent.  Pt incontinent of  Urine will continue with current plan of care Nemiah Commander

## 2011-01-07 LAB — GLUCOSE, CAPILLARY
Glucose-Capillary: 137 mg/dL — ABNORMAL HIGH (ref 70–99)
Glucose-Capillary: 152 mg/dL — ABNORMAL HIGH (ref 70–99)
Glucose-Capillary: 178 mg/dL — ABNORMAL HIGH (ref 70–99)

## 2011-01-07 NOTE — Progress Notes (Signed)
Progress Notes  Speech Language Pathology Therapy Note  Patient Details  Name: [name] MRN: [id number] Date of Birth: June 13, 1955  Today's Date: 01/07/2011 Time: 0805-0900 Time Calculation (min): 55 min  Precautions: Precautions Precautions: Fall Precaution Comments: monitor O2 during activity Required Braces or Orthoses: No Restrictions Weight Bearing Restrictions: No Other Position/Activity Restrictions: posterior lean  Short Term Goals: set 01/02/11  1. Patient will consume dysphagia 3 (soft solids) and nectar-thick liquids showing no overt s/s of aspiration with the use of a chin tuck and other compensatory strategies with moderate assist  2. Patient will demonstrate day to day carryover of self care information with moderate assist semantic cues  3. Patient will demonstrate selective attention to task fr 10-12 minutes with minimal assist semantic cues  4. Patient will initiate verbal expression with requests for help or for communicating information with moderate assist semantic cues  5. Patient will self monitor and correct vocal intensity with minimal assist semantic cues  Skilled Therapeutic Interventions/Progress Updates: Session focused on self feeding of Dys.3 textures and Nectar-thick liquids via straw sip with minimal assist semantic cues to perform through chin tuck with all bites and sips due to decreased recall of information.  Additionally, SLP facilitated session with moderate assist semantic cues to initiate verbal expression to direct caregivers with tray set up and how to assist patient.  SLP educated and demonstrated oropharyngeal strengthening exercise with Retail buyer). Patient able to perform 10 times with 2-3 rest breaks and minimal assist semantic cues.   Pain Pain Assessment Pain Assessment: No/denies pain Pain Score: 0-No pain  Therapy/Group: Individual Therapy  Charlane Ferretti., CCC-SLP [phone] Andree Golphin 01/07/2011 11:04 AM

## 2011-01-07 NOTE — Progress Notes (Signed)
Patient ID: [name], male   DOB: 18-Jan-1955, 55 y.o.   MRN: [id number] Subjective/ComplaintsReview of Systems  HENT: Negative for congestion.        Trach removed  Respiratory: Negative.   Gastrointestinal: Negative for nausea, abdominal pain and diarrhea.       PEG  Genitourinary: Negative for frequency and hematuria.       Condom cath  All other systems reviewed and are negative.   Larey Seat out of bed last noc, tried to get up without help.  Pt denies injury.  He is oriented without c/os Objective: Vital Signs: Blood pressure 148/95, pulse 94, temperature 98 F (36.7 C), temperature source Axillary, resp. rate 20, height 6' (1.829 m), weight 101.1 kg (222 lb 14.2 oz), SpO2 96.00%. No results found. Results for orders placed during the hospital encounter of 12/22/10 (from the past 72 hour(s))  GLUCOSE, CAPILLARY     Status: Abnormal   Collection Time   01/04/11 11:53 AM      Component Value Range Comment   Glucose-Capillary 161 (*) 70 - 99 (mg/dL)    Comment 1 Notify RN     GLUCOSE, CAPILLARY     Status: Abnormal   Collection Time   01/04/11  4:47 PM      Component Value Range Comment   Glucose-Capillary 132 (*) 70 - 99 (mg/dL)    Comment 1 Notify RN     GLUCOSE, CAPILLARY     Status: Abnormal   Collection Time   01/04/11  9:01 PM      Component Value Range Comment   Glucose-Capillary 164 (*) 70 - 99 (mg/dL)   GLUCOSE, CAPILLARY     Status: Abnormal   Collection Time   01/05/11  7:25 AM      Component Value Range Comment   Glucose-Capillary 146 (*) 70 - 99 (mg/dL)   GLUCOSE, CAPILLARY     Status: Abnormal   Collection Time   01/05/11 11:27 AM      Component Value Range Comment   Glucose-Capillary 164 (*) 70 - 99 (mg/dL)   BASIC METABOLIC PANEL     Status: Abnormal   Collection Time   01/05/11 12:34 PM      Component Value Range Comment   Sodium 138  135 - 145 (mEq/L)    Potassium 4.4  3.5 - 5.1 (mEq/L)    Chloride 101  96 - 112 (mEq/L)    CO2 26  19 - 32 (mEq/L)     Glucose, Bld 171 (*) 70 - 99 (mg/dL)    BUN 11  6 - 23 (mg/dL)    Creatinine, Ser 4.09  0.50 - 1.35 (mg/dL)    Calcium 81.1  8.4 - 10.5 (mg/dL)    GFR calc non Af Amer 69 (*) >90 (mL/min)    GFR calc Af Amer 80 (*) >90 (mL/min)   GLUCOSE, CAPILLARY     Status: Abnormal   Collection Time   01/05/11  4:47 PM      Component Value Range Comment   Glucose-Capillary 129 (*) 70 - 99 (mg/dL)    Comment 1 Notify RN     GLUCOSE, CAPILLARY     Status: Abnormal   Collection Time   01/05/11  9:31 PM      Component Value Range Comment   Glucose-Capillary 179 (*) 70 - 99 (mg/dL)    Comment 1 Notify RN     GLUCOSE, CAPILLARY     Status: Abnormal   Collection Time   01/06/11  7:24 AM      Component Value Range Comment   Glucose-Capillary 138 (*) 70 - 99 (mg/dL)    Comment 1 Notify RN     GLUCOSE, CAPILLARY     Status: Abnormal   Collection Time   01/06/11 11:46 AM      Component Value Range Comment   Glucose-Capillary 165 (*) 70 - 99 (mg/dL)    Comment 1 Notify RN     GLUCOSE, CAPILLARY     Status: Abnormal   Collection Time   01/06/11  4:01 PM      Component Value Range Comment   Glucose-Capillary 136 (*) 70 - 99 (mg/dL)   GLUCOSE, CAPILLARY     Status: Abnormal   Collection Time   01/06/11  9:35 PM      Component Value Range Comment   Glucose-Capillary 166 (*) 70 - 99 (mg/dL)    Comment 1 Notify RN     URIC ACID     Status: Abnormal   Collection Time   01/07/11  6:00 AM      Component Value Range Comment   Uric Acid, Serum 8.3 (*) 4.0 - 7.8 (mg/dL)   GLUCOSE, CAPILLARY     Status: Abnormal   Collection Time   01/07/11  7:17 AM      Component Value Range Comment   Glucose-Capillary 148 (*) 70 - 99 (mg/dL)    Comment 1 Notify RN         .  Patient sitting comfortably in his Bed. Patient has weak cough  Trach site with granulation tissue at base, stoma is closed. He is fair oral motor control. No gross cranial nerve abnormalities I can see on exam today. Patient was able to  follow simple one-step commands. He communicated with simple yes no and shakes.  He had diminished fine motor movement in all 4 limbs. He may have had some underlying ataxia on the right  Strength was grossly3/5 proximally in both upper limbs. Lower extremity grossly3/5 proximal to3/5 distally. Patient did have sensation to gross pain stimulation. Reflexes are 1+ grossly throughout. Patient had poor insight and and awareness. Heart was irregularly irregular. Chest was clear. Abdomen soft nontender. PEG site was clean and intact with no drainage. Skin throughout was generally intact. Mild edema in all 4 limbs at one plus,@+ LUE.All 4 limbs were warm with 2+ pulses.    Assessment/Plan: 1. Functional deficits secondary to R cerebellar ICH severe R UE and R LE ataxia which require 3+ hours per day of interdisciplinary therapy in a comprehensive inpatient rehab setting.  Team conf today Physiatrist is providing close team supervision and 24 hour management of active medical problems listed below. Physiatrist and rehab team continue to assess barriers to discharge/monitor patient progress toward functional and medical goals. Mobility: Bed Mobility Bed Mobility: Yes Rolling Right: 2: Max assist;With rail Rolling Left: 3: Mod assist Right Sidelying to Sit: With rails;2: Max assist Sitting - Scoot to Edge of Bed: 1: +1 Total assist;Other (comment) (significant posterior right lean) Transfers Sit to Stand: 1: +1 Total assist Sit to Stand Details (indicate cue type and reason): significant posterior right lean, slow to initiate movement Stand to Sit: 1: +1 Total assist Stand Pivot Transfer Details (indicate cue type and reason): poor LE eccentric control with uncontrolled descent, significant posterior right lean Squat Pivot Transfers: 1: +2 Total assist Squat Pivot Transfer Details (indicate cue type and reason): total assist + 2 (pt = 50%), poor graded movement and weight shift,  slow to initiate with  LEs Ambulation/Gait Stairs: No (pre-gait level at this time, unsafe to attempt) Wheelchair Mobility Wheelchair Mobility: No  ADL:    Cognition: Cognition Overall Cognitive Status: Impaired Arousal/Alertness: Awake/alert Orientation Level: Oriented to person;Oriented to place;Oriented to time Attention: Sustained Sustained Attention: Impaired Sustained Attention Impairment: Functional basic Memory: Impaired Memory Impairment: Decreased recall of new information;Decreased long term memory Decreased Long Term Memory: Verbal basic;Functional basic Awareness: Impaired Awareness Impairment: Emergent impairment Problem Solving: Impaired Problem Solving Impairment: Functional basic Executive Function: Self Monitoring;Self Correcting Self Monitoring: Impaired Self Monitoring Impairment: Functional basic Self Correcting: Impaired Self Correcting Impairment: Verbal basic;Functional basic Safety/Judgment: Appears intact Cognition Arousal/Alertness: Awake/alert Orientation Level: Oriented to person;Oriented to place;Oriented to time  2. Anticoagulation/DVT prophylaxis with Pharmaceutical:no anticoag use SCDs 3. Pain Management:monitor, tylenol for non-specific right foot pain. Patient Active Hospital Problem List: 4.  Afib cannot use warfarin due to ICH Diabetes mellitus (11/19/2010)    POA: Yes 5.  LUE edema improving 6.   DM Lantus monitor CBG--good control at present. 7. Respiratory failure (11/19/2010)   decannulated trach site improving    8.Gout (11/20/2010) -may be behind some of foot pain from yesterday.  Foot minimally tender on exam.  Check uric acid level tomorrow     9..  Hypo K monitor no diuretics 10.  HTN controlled 16  Atiya Yera E 01/07/2011, 7:52 AM

## 2011-01-07 NOTE — Progress Notes (Signed)
Pt call light when off, nurse went to room to see what pt needed and observe pt on the floor sitting up against the chair, upon assessment (head to20 toe) neuro-check so no decrease LOC or deviation from the baseline, no injuries noted pt denies pain or hitting his head during the fall, pt stated he thought that someone was calling him and that when he attempt to rise from bed. he was assist from the floor to back to bed by nursing staff. Call be within reach and bed alarm activated,  Vital sign b/p 129/89, pulse 88  resp. 20 stat 96% r/a. PA Pam Love was notify of fall. Fall protocol initiated.

## 2011-01-07 NOTE — Progress Notes (Signed)
Physical Therapy Session Note  Patient Details  Name: [name] MRN: [id number] Date of Birth: 08-29-1955  Today's Date: 01/07/2011 Time: 1300-1401 Time Calculation (min): 61 min  Precautions: Precautions Precautions: Fall Precaution Comments: monitor O2 during activity Required Braces or Orthoses: No Restrictions Weight Bearing Restrictions: No Other Position/Activity Restrictions: posterior lean  Short Term Goals: PT Short Term Goal 1: Patient will perform bed mobility and supine-sit with supervision. PT Short Term Goal 1 - Progress: Progressing toward goal PT Short Term Goal 2: Patient will transfer with min assist 3 of 5 trials. PT Short Term Goal 2 - Progress: Progressing toward goal PT Short Term Goal 3: Patient will maintain dynamic standing balance with min assist > 5 min for ADL task. PT Short Term Goal 3 - Progress: Progressing toward goal PT Short Term Goal 4: Patient will gait 15 feet with max assist. PT Short Term Goal 4 - Progress: Progressing toward goal PT Short Term Goal 5: Patient will propel W/C > 100 feet with min assist. PT Short Term Goal 5 - Progress: Progressing toward goal  Skilled Therapeutic Interventions/Progress Updates:     General Chart Reviewed: Yes Family/Caregiver Present: Yes (brother Molly Maduro)   Pain Pain Assessment Pain Assessment: No/denies pain Pain Score: 0-No pain  Other Treatments  W/C mobility with bilat UEs and LEs focus on smoothness of movement for increased UE and LE coordination. Standing dynamic pre-gait activities with focus on and manual facilitation for anterior weight shift. Patient tends to hold weight through heels with very narrow BOS resulting in posterior lean. Patient more emergently aware of this today, but requires assist to initiate self correction and increased weight shift anteriorly through balls of feet. Anterior lateral reaching activity in standing to further increase anterior and lateral weight shifts to  progress patient's center of gravity over BOS and improve hip and ankle strategies. Gait x 50 feet and 75 feet total assist + 2, patient = 25%, one person in front of patient with bilat hand held assist to facilitate anterior weight shift and progression of center of gravity over BOS, assist of another behind patient for manual facilitation of bilat hip extension, graded lateral weight shifts, and right LE placement for stance phase to decrease scissoring past midline. Patient very fatigued but pleased with progress at end of session. Hip and ankle strategies emerging but delayed following treatment today.  Therapy/Group: Individual Therapy  [name] 01/07/2011, 3:39 PM

## 2011-01-07 NOTE — Progress Notes (Signed)
Speech Pathology: Dysphagia Treatment Note  Individual Session  Time: 1130-1200  Patient was observed with : Mechanical Soft and Nectar liquids via straw.  Patient was noted to have s/s of aspiration : Yes:  Cough x1 at end of meal  Lung Sounds:  WNL Temperature: WNL  Patient required: supervision semanic cues to consistently follow precautions/strategies  Clinical Impression: SLP facilitated session with supervision semantic cues to self monitor bites size, which reduced anterior loss of bolus as well as supervision semantic cues to perform a through chin tuck with Nectar-thick liquids via straw.  Cough at end of session due to suspected penetration however, patient with a hard cough that probably reduced penetrates.   Recommendations:  Continue with current plan of care  Pain:   none Intervention Required:   No  Goals: Progressing  Fae Pippin, M.A., CCC-SLP [phone]

## 2011-01-07 NOTE — Progress Notes (Signed)
Pt alert, denies pain show no sign of distress at this time, RP came in to visit pt and was made aware that pt fell earlier this morning.

## 2011-01-07 NOTE — Progress Notes (Signed)
Occupational Therapy Session Note  Patient Details  Name: [name] MRN: [id number] Date of Birth: [birth year]  Today's Date: 01/07/2011 Time: 0940-1030 Time Calculation (min): 50 min  Precautions: Precautions Precautions: Fall Precaution Comments: monitor O2 during activity Required Braces or Orthoses: No Restrictions Weight Bearing Restrictions: No Other Position/Activity Restrictions: posterior lean  Skilled Therapeutic Interventions/Progress Updates:    Patient seen this am for bathing and dressing at sink level to address dynamic sitting balance, static standing balance, and family education (hands on) with brother, Barry Maduro.      Pain Pain Assessment Pain Assessment: No/denies pain Pain Score: 0-No pain   Therapy/Group: Individual Therapy  [name] 01/07/2011, 11:31 AM

## 2011-01-07 NOTE — Progress Notes (Signed)
Pt is alert but has mild expressive aphasia. Dysphagia 3 Nectar thick liquid diet with full supervision. Pt wears condom catheter but removed today and toileting schedule began. Continent of bowel. Pt can assist with pulling pants up and down. Peri care performed by staff. Left arm elevated due to edema. PEG tube flushed per protocol.  Pills crushed and placed in applesauce. No other change in assessment. Continue POC.

## 2011-01-08 LAB — BASIC METABOLIC PANEL
BUN: 8 mg/dL (ref 6–23)
CO2: 26 mEq/L (ref 19–32)
Chloride: 101 mEq/L (ref 96–112)
GFR calc Af Amer: >90 mL/min (ref >90)
Glucose, Bld: 143 mg/dL — ABNORMAL HIGH (ref 70–99)
Potassium: 3.6 mEq/L (ref 3.5–5.1)

## 2011-01-08 LAB — GLUCOSE, CAPILLARY
Glucose-Capillary: 123 mg/dL — ABNORMAL HIGH (ref 70–99)
Glucose-Capillary: 169 mg/dL — ABNORMAL HIGH (ref 70–99)

## 2011-01-08 MED ORDER — ALLOPURINOL 100 MG PO TABS
100.0000 mg | ORAL_TABLET | Freq: Every day | ORAL | Status: DC
  Administered 2011-01-08 – 2011-01-19 (×11): 100 mg via ORAL
  Filled 2011-01-08 (×14): qty 1

## 2011-01-08 NOTE — Progress Notes (Signed)
Speech Pathology: Dysphagia Treatment Note  Individual Session  Time: 0981-1914  Patient was observed with : Mechanical Soft and Nectar liquids via straw.  Patient was noted to have s/s of aspiration : Yes:  Cough x2  Lung Sounds:  WNL Temperature: WNL  Patient required: minimal cues to consistently follow precautions/strategies  Clinical Impression: SLP facilitated session with minimal assist semantic cues to self monitor bite size and perform through chin tuck with all swallows.   Recommendations:  Continue with current plan of care  Pain:   none Intervention Required:   No  Goals: Progressing  Fae Pippin, M.A., CCC-SLP [phone]

## 2011-01-08 NOTE — Progress Notes (Signed)
Nutrition Follow-up  Pt continuing to eat well, 75-100% of meals. Denies any issues with appetite at this time. Receives 100 ml free water TID via PEG. Bolus PRN TF has been discontinued.  Diet Order:  Dysphagia 3, Nectar Liquids  Meds: Scheduled Meds:   . allopurinol  100 mg Oral Daily  . baclofen  5 mg Oral BID  . carvedilol  25 mg Oral BID WC  . diltiazem  90 mg Oral Q6H  . doxazosin  1 mg Oral QHS  . free water  100 mL Per Tube Q8H  . insulin aspart  0-15 Units Subcutaneous TID WC  . insulin aspart  0-5 Units Subcutaneous QHS  . insulin glargine  15 Units Subcutaneous BID  . isosorbide-hydrALAZINE  2 tablet Oral TID  . pantoprazole  40 mg Oral Q1200  . potassium chloride  20 mEq Oral Daily  . ramipril  10 mg Oral BID  . rosuvastatin  5 mg Oral q1800  . simethicone  80 mg Oral QID   Continuous Infusions:  PRN Meds:.acetaminophen, ALPRAZolam, alum & mag hydroxide-simeth, bisacodyl, diphenhydrAMINE, food thickener, guaiFENesin-dextromethorphan, polyethylene glycol, promethazine, promethazine, promethazine, silver nitrate applicators, sodium chloride, traZODone  Labs:  CMP     Component Value Date/Time   NA 139 01/08/2011 0645   K 3.6 01/08/2011 0645   CL 101 01/08/2011 0645   CO2 26 01/08/2011 0645   GLUCOSE 143* 01/08/2011 0645   BUN 8 01/08/2011 0645   CREATININE 0.85 01/08/2011 0645   CALCIUM 9.4 01/08/2011 0645   PROT 7.1 12/23/2010 0420   ALBUMIN 2.4* 12/23/2010 0420   AST 20 12/23/2010 0420   ALT 23 12/23/2010 0420   ALKPHOS 92 12/23/2010 0420   BILITOT 0.2* 12/23/2010 0420   GFRNONAA >90 01/08/2011 0645   GFRAA >90 01/08/2011 0645     Intake/Output Summary (Last 24 hours) at 01/08/11 1217 Last data filed at 01/08/11 0900  Gross per 24 hour  Intake    960 ml  Output   1500 ml  Net   -540 ml    Weight Status:  100.1 kg, wt trending up  Nutrition Dx:  Swallowing difficulty - persists.  Goal:  PO's to meet >90% estimated needs. Met.  Intervention:     1. Continue current interventions in place, pt eating well. 2. RD to follow nutrition care plan  Monitor:  Weights, labs, PO intake, I/O's  Adair Laundry Pager #:  [phone]

## 2011-01-08 NOTE — Progress Notes (Signed)
Physical Therapy Session Note  Patient Details  Name: [name] MRN: [id number] Date of Birth: December 30, 1955  Today's Date: 01/08/2011 Time: 1300-1400 Time Calculation (min): 60 min  Precautions: Precautions Precautions: Fall Precaution Comments: monitor O2 during activity Required Braces or Orthoses: No Restrictions Weight Bearing Restrictions: No Other Position/Activity Restrictions: posterior lean  Short Term Goals: PT Short Term Goal 1: Patient will perform bed mobility and supine-sit with supervision. PT Short Term Goal 1 - Progress: Progressing toward goal PT Short Term Goal 2: Patient will transfer with min assist 3 of 5 trials. PT Short Term Goal 2 - Progress: Progressing toward goal PT Short Term Goal 3: Patient will maintain dynamic standing balance with min assist > 5 min for ADL task. PT Short Term Goal 3 - Progress: Progressing toward goal PT Short Term Goal 4: Patient will gait 15 feet with max assist. PT Short Term Goal 4 - Progress: Progressing toward goal PT Short Term Goal 5: Patient will propel W/C > 100 feet with min assist. PT Short Term Goal 5 - Progress: Progressing toward goal  Skilled Therapeutic Interventions/Progress Updates:     General Chart Reviewed: Yes Family/Caregiver Present: No   Pain Pain Assessment Pain Assessment: No/denies pain Pain Score: 0-No pain  Other Treatments  W/C mobility with bilat LEs min assist x 100 feet, focus on symmetry and grading of LE movement for improved coordination. Squat pivot transfer mod assist, focus on anterior weight shift. Standing pre-gait activities with manual facilitation for anterior weight shift to improve alignment and advancement of center of gravity over BOS mod assist. During standing, patient reports having to go to the bathroom. Squat-pivot transfer to toilet mod assist. Some incontinence into brief noted. Total assist to change, focus on dynamic sitting balance and weight shift. Toileting max  assist (1 of 3 steps), focus on sequencing and balance strategies.   Therapy/Group: Individual Therapy  [name] 01/08/2011, 4:03 PM

## 2011-01-08 NOTE — Progress Notes (Signed)
Progress Notes  Speech Language Pathology Therapy Note  Patient Details  Name: GLEASON ARDOIN MRN: [id number] Date of Birth: [birth year]  Today's Date: 01/08/2011 Time: 1400-1430 Time Calculation (min): 30 min  Precautions: Precautions Precautions: Fall Precaution Comments: monitor O2 during activity Required Braces or Orthoses: No Restrictions Weight Bearing Restrictions: No Other Position/Activity Restrictions: posterior lean  Skilled Therapeutic Interventions/Progress Updates: Session focused on initiation of verbal expression. SLP facilitated session with sequencing cards and minimal assist semantic cues to initiate verbal expression to state a sentence for every step.  Patient with emerging humor today and increased recall of daily events.     Precautions/Restrictions  Precautions Precautions: Fall Required Braces or Orthoses: No Restrictions Weight Bearing Restrictions: No   Pain Pain Assessment Pain Assessment: No/denies pain Pain Score: 0-No pain  Therapy/Group: Individual Therapy  Charlane Ferretti., CCC-SLP [phone] Desirai Traxler 01/08/2011 4:45 PM

## 2011-01-08 NOTE — Progress Notes (Signed)
Occupational Therapy Session Note  Patient Details  Name: [name] MRN: [id number] Date of Birth: 1955/10/07  Today's Date: 01/08/2011  Precautions: Precautions Precautions: Fall Precaution Comments: monitor O2 during activity Required Braces or Orthoses: No Restrictions Weight Bearing Restrictions: No Other Position/Activity Restrictions: posterior lean  Short Term Goals: OT Short Term Goal 1: Pt will complete bathing with mod assist in seated position OT Short Term Goal 1 - Progress: Met OT Short Term Goal 2: Pt will complete UB dressing with min assist  OT Short Term Goal 2 - Progress: Met OT Short Term Goal 3: Pt will complete LB dressing with max assist in sit to stand position OT Short Term Goal 3 - Progress: Met OT Short Term Goal 4: Pt will demonstrate improved unsupported sitting balance for 10 mins to complete UB bathing and dressing OT Short Term Goal 4 - Progress: Met  Treatment Session 1:  Time: 9562-1308 Time Calculation (min): 57 min   Engaged in ADL retraining at sink from w/c level.  Focus on bed mobility, stand pivot transfer with initiation of stepping with Lt foot, upright standing balance, and increased participation in LB bathing and dressing.  Pt demonstrated improved ability to reach bilat feet to wash and don socks by bringing foot up to knee.  Sit to stand with mod assist with lifting and mod verbal cues for upright standing and to stick out belly to improve standing balance and decrease posterior lean. Engaged in functional task in standing with cues for weight shifting.  Pain   No c/o pain this session. ADL See FIM for details  Treatment Session 2: Time: 1430-1500 (30 mins) Engaged in therapeutic activity in sitting and standing. Focus on BUE use with building activity, midline sitting balance, initiation, decreased ataxia, and standing balance with and without support of BUE.  Pt demonstrated increased apraxia with RUE than LUE and persistent  visual discrepancies despite use of eye patch.  Verbal and tactile cues for upright standing posture with cues to tuck in bottom and stick out belly.  Use of one UE for support in standing and approx 2 mins with no UE support with use of BUE to complete activity.  No c/o pain this session  Therapy/Group: Individual Therapy  Leonette Monarch 01/08/2011, 11:32 AM

## 2011-01-08 NOTE — Progress Notes (Signed)
Patient ID: [name], male   DOB: Aug 03, 1955, 55 y.o.   MRN: [id number] Subjective/ComplaintsReview of Systems  HENT: Negative for congestion.        Trach removed  Respiratory: Negative.   Gastrointestinal: Negative for nausea, abdominal pain and diarrhea.       PEG  Genitourinary: Negative for frequency and hematuria.       Condom cath  All other systems reviewed and are negative.   Larey Seat out of bed last noc, tried to get up without help.  Pt denies injury.  He is oriented without c/os Objective: Vital Signs: Blood pressure 140/80, pulse 85, temperature 99.3 F (37.4 C), temperature source Oral, resp. rate 18, height 6' (1.829 m), weight 100.1 kg (220 lb 10.9 oz), SpO2 99.00%. No results found. Results for orders placed during the hospital encounter of 12/22/10 (from the past 72 hour(s))  GLUCOSE, CAPILLARY     Status: Abnormal   Collection Time   01/05/11 11:27 AM      Component Value Range Comment   Glucose-Capillary 164 (*) 70 - 99 (mg/dL)   BASIC METABOLIC PANEL     Status: Abnormal   Collection Time   01/05/11 12:34 PM      Component Value Range Comment   Sodium 138  135 - 145 (mEq/L)    Potassium 4.4  3.5 - 5.1 (mEq/L)    Chloride 101  96 - 112 (mEq/L)    CO2 26  19 - 32 (mEq/L)    Glucose, Bld 171 (*) 70 - 99 (mg/dL)    BUN 11  6 - 23 (mg/dL)    Creatinine, Ser 1.32  0.50 - 1.35 (mg/dL)    Calcium 44.0  8.4 - 10.5 (mg/dL)    GFR calc non Af Amer 69 (*) >90 (mL/min)    GFR calc Af Amer 80 (*) >90 (mL/min)   GLUCOSE, CAPILLARY     Status: Abnormal   Collection Time   01/05/11  4:47 PM      Component Value Range Comment   Glucose-Capillary 129 (*) 70 - 99 (mg/dL)    Comment 1 Notify RN     GLUCOSE, CAPILLARY     Status: Abnormal   Collection Time   01/05/11  9:31 PM      Component Value Range Comment   Glucose-Capillary 179 (*) 70 - 99 (mg/dL)    Comment 1 Notify RN     GLUCOSE, CAPILLARY     Status: Abnormal   Collection Time   01/06/11  7:24 AM   Component Value Range Comment   Glucose-Capillary 138 (*) 70 - 99 (mg/dL)    Comment 1 Notify RN     GLUCOSE, CAPILLARY     Status: Abnormal   Collection Time   01/06/11 11:46 AM      Component Value Range Comment   Glucose-Capillary 165 (*) 70 - 99 (mg/dL)    Comment 1 Notify RN     GLUCOSE, CAPILLARY     Status: Abnormal   Collection Time   01/06/11  4:01 PM      Component Value Range Comment   Glucose-Capillary 136 (*) 70 - 99 (mg/dL)   GLUCOSE, CAPILLARY     Status: Abnormal   Collection Time   01/06/11  9:35 PM      Component Value Range Comment   Glucose-Capillary 166 (*) 70 - 99 (mg/dL)    Comment 1 Notify RN     URIC ACID     Status: Abnormal  Collection Time   01/07/11  6:00 AM      Component Value Range Comment   Uric Acid, Serum 8.3 (*) 4.0 - 7.8 (mg/dL)   GLUCOSE, CAPILLARY     Status: Abnormal   Collection Time   01/07/11  7:17 AM      Component Value Range Comment   Glucose-Capillary 148 (*) 70 - 99 (mg/dL)    Comment 1 Notify RN     GLUCOSE, CAPILLARY     Status: Abnormal   Collection Time   01/07/11 11:42 AM      Component Value Range Comment   Glucose-Capillary 137 (*) 70 - 99 (mg/dL)    Comment 1 Notify RN     GLUCOSE, CAPILLARY     Status: Abnormal   Collection Time   01/07/11  4:38 PM      Component Value Range Comment   Glucose-Capillary 152 (*) 70 - 99 (mg/dL)    Comment 1 Notify RN     GLUCOSE, CAPILLARY     Status: Abnormal   Collection Time   01/07/11  8:47 PM      Component Value Range Comment   Glucose-Capillary 178 (*) 70 - 99 (mg/dL)    Comment 1 Notify RN         .  Patient sitting comfortably in his Bed. Patient has weak cough  Trach site with granulation tissue at base, stoma is closed. He is fair oral motor control. No gross cranial nerve abnormalities I can see on exam today. Patient was able to follow simple one-step commands. He communicated with simple yes no and shakes.  He had diminished fine motor movement in all 4 limbs.  He may have had some underlying ataxia on the right  Strength was grossly3/5 proximally in both upper limbs. Lower extremity grossly3/5 proximal to3/5 distally. Patient did have sensation to gross pain stimulation. Reflexes are 1+ grossly throughout. Patient had poor insight and and awareness. Heart was irregularly irregular. Chest was clear. Abdomen soft nontender. PEG site was clean and intact with no drainage. Skin throughout was generally intact. Mild edema in all 4 limbs at one plus,@+ LUE.All 4 limbs were warm with 2+ pulses.    Assessment/Plan: 1. Functional deficits secondary to R cerebellar ICH severe R UE and R LE ataxia which require 3+ hours per day of interdisciplinary therapy in a comprehensive inpatient rehab setting.  Team conf today Physiatrist is providing close team supervision and 24 hour management of active medical problems listed below. Physiatrist and rehab team continue to assess barriers to discharge/monitor patient progress toward functional and medical goals. Mobility: Bed Mobility Bed Mobility: Yes Rolling Right: 2: Max assist;With rail Rolling Left: 3: Mod assist Right Sidelying to Sit: With rails;2: Max assist Sitting - Scoot to Edge of Bed: 1: +1 Total assist;Other (comment) (significant posterior right lean) Transfers Sit to Stand: 1: +1 Total assist Sit to Stand Details (indicate cue type and reason): significant posterior right lean, slow to initiate movement Stand to Sit: 1: +1 Total assist Stand Pivot Transfer Details (indicate cue type and reason): poor LE eccentric control with uncontrolled descent, significant posterior right lean Squat Pivot Transfers: 1: +2 Total assist Squat Pivot Transfer Details (indicate cue type and reason): total assist + 2 (pt = 50%), poor graded movement and weight shift, slow to initiate with LEs Ambulation/Gait Stairs: No (pre-gait level at this time, unsafe to attempt) Wheelchair Mobility Wheelchair Mobility: No  ADL:      Cognition: Cognition Overall Cognitive  Status: Impaired Arousal/Alertness: Awake/alert Orientation Level: Oriented to person;Oriented to place;Disoriented to time Attention: Sustained Sustained Attention: Impaired Sustained Attention Impairment: Functional basic Memory: Impaired Memory Impairment: Decreased recall of new information;Decreased long term memory Decreased Long Term Memory: Verbal basic;Functional basic Awareness: Impaired Awareness Impairment: Emergent impairment Problem Solving: Impaired Problem Solving Impairment: Functional basic Executive Function: Self Monitoring;Self Correcting Self Monitoring: Impaired Self Monitoring Impairment: Functional basic Self Correcting: Impaired Self Correcting Impairment: Verbal basic;Functional basic Safety/Judgment: Appears intact Cognition Arousal/Alertness: Awake/alert Orientation Level: Oriented to person;Oriented to place;Disoriented to time  2. Anticoagulation/DVT prophylaxis with Pharmaceutical:no anticoag use SCDs 3. Pain Management:monitor, tylenol for non-specific right foot pain. Patient Active Hospital Problem List: 4.  Afib cannot use warfarin due to ICH Diabetes mellitus (11/19/2010)    POA: Yes 5.  LUE edema improving 6.   DM Lantus monitor CBG--good control at present. 7. Dysphagia nectar liquids plus H2O protocol    8.Gout (11/20/2010) -may be behind some of foot pain from yesterday.  Foot minimally tender on exam. Urate level elevated pt reports being on allopurinol will resume  9..  Hypo K monitor no diuretics 10.  HTN controlled 17  Armya Westerhoff E 01/08/2011, 7:30 AM

## 2011-01-09 LAB — GLUCOSE, CAPILLARY
Glucose-Capillary: 129 mg/dL — ABNORMAL HIGH (ref 70–99)
Glucose-Capillary: 175 mg/dL — ABNORMAL HIGH (ref 70–99)
Glucose-Capillary: 128 mg/dL — ABNORMAL HIGH (ref 70–99)
Glucose-Capillary: 154 mg/dL — ABNORMAL HIGH (ref 70–99)

## 2011-01-09 MED ORDER — POTASSIUM CHLORIDE CRYS ER 20 MEQ PO TBCR
20.0000 meq | EXTENDED_RELEASE_TABLET | Freq: Two times a day (BID) | ORAL | Status: DC
  Administered 2011-01-09 – 2011-01-13 (×8): 20 meq via ORAL
  Filled 2011-01-09 (×14): qty 1

## 2011-01-09 MED ORDER — ASPIRIN 81 MG PO CHEW
81.0000 mg | CHEWABLE_TABLET | Freq: Every day | ORAL | Status: DC
  Administered 2011-01-09 – 2011-01-19 (×10): 81 mg via ORAL
  Filled 2011-01-09 (×10): qty 1

## 2011-01-09 NOTE — Progress Notes (Signed)
Physical Therapy Session Note  Patient Details  Name: [name] MRN: [id number] Date of Birth: 04/24/1955  Today's Date: 01/09/2011 Time: 4098-1191 Time Calculation (min): 32 min  Precautions: Precautions Precautions: Fall Precaution Comments: monitor O2 during activity Required Braces or Orthoses: No Restrictions Weight Bearing Restrictions: No Other Position/Activity Restrictions: posterior lean  Short Term Goals: PT Short Term Goal 1: Patient will perform bed mobility and supine-sit with supervision. PT Short Term Goal 1 - Progress: Progressing toward goal PT Short Term Goal 2: Patient will transfer with min assist 3 of 5 trials. PT Short Term Goal 2 - Progress: Progressing toward goal PT Short Term Goal 3: Patient will maintain dynamic standing balance with min assist > 5 min for ADL task. PT Short Term Goal 3 - Progress: Progressing toward goal PT Short Term Goal 4: Patient will gait 15 feet with max assist. PT Short Term Goal 4 - Progress: Progressing toward goal PT Short Term Goal 5: Patient will propel W/C > 100 feet with min assist. PT Short Term Goal 5 - Progress: Progressing toward goal  Skilled Therapeutic Interventions/Progress Updates:     General Chart Reviewed: Yes Family/Caregiver Present: No   Pain Pain Assessment Pain Assessment: No/denies pain Pain Score: 0-No pain  Other Treatments  Bed mobility and supine-sit supervision, focus on increased trunk rotation, lateral weight shift to achieve seated position, and sequencing. After sitting EOB, patient stated he needed to go to the bathroom. Squat-pivot transfer bed-W/C mod assist, focus on LE position and anterior weight shift to increase functional independence. Toilet transfer, squat-pivot with grab bar, focus on LE position and anterior weight shift, mod assist. Sit-stand x 3 for patient to don and doff pants with mod assist for balance due to posterior lean, manual facilitation to increase anterior  weight shift during functional task.  Therapy/Group: Individual Therapy  [name] 01/09/2011, 3:04 PM

## 2011-01-09 NOTE — Progress Notes (Signed)
Speech Pathology: Dysphagia Treatment Note  Patient was observed with : Mechanical Soft / Ground and Nectar liquids.  Patient was noted to have s/s of aspiration : Yes:  One incidence of cough noted at beginning of meal after patient took several large sips of nectar thick liquid.   Patient required: setup assistance for weighted utensils and to cut food into manageable bites verbal reminder and demonstration cues to consistently follow precautions/strategies (small bites/sips, chin tuck when swallowing liquids.)  Clinical Impression: Patient demonstrated improved diet toleration with lunch meal as compared to yesterday's (01/08/11) session with significant decrease in overt s/s aspiration(cough) and more consistent and accurate use of swallow precautions.   Recommendations:  Patient's most recent MBSS was on 01/02/11, and his anticipated discharge from [facility] is 01/18/10. Another follow-up MBSS prior to discharge is recommended to assess patient's readiness for diet upgrade.  Pain:   none Intervention Required:   No   Goals: Progressing toward swallow function goals.   Angela Nevin, MA, CCC-SLP [facility] Speech-Language Pathologist

## 2011-01-09 NOTE — Progress Notes (Signed)
Occupational Therapy Session Note  Patient Details  Name: [name] MRN: [id number] Date of Birth: Mar 11, 1955  Today's Date: 01/09/2011 Time: 1030-1124 Time Calculation (min): 54 min  Precautions: Precautions Precautions: Fall Precaution Comments: monitor O2 during activity Required Braces or Orthoses: No Restrictions Weight Bearing Restrictions: No Other Position/Activity Restrictions: posterior lean  Short Term Goals: OT Short Term Goal 1: Pt will complete bathing with mod assist in seated position OT Short Term Goal 1 - Progress: Met OT Short Term Goal 2: Pt will complete UB dressing with min assist  OT Short Term Goal 2 - Progress: Met OT Short Term Goal 3: Pt will complete LB dressing with max assist in sit to stand position OT Short Term Goal 3 - Progress: Met OT Short Term Goal 4: Pt will demonstrate improved unsupported sitting balance for 10 mins to complete UB bathing and dressing OT Short Term Goal 4 - Progress: Met  Skilled Therapeutic Interventions/Progress Updates:    Pt seen for ADL retraining at sink level from w/c.  Focus on side scooting, squat pivot transfer, midline sitting balance, and decreasing posterior lean in upright standing.  Pt required mod verbal cues for technique of squat pivot transfer, pt attempted stand pivot but started to lose balance to Rt and posterior.  Pt demonstrated improved sit to stand at sink with sink for UE support, demonstrated increased standing balance with BUE support and one hand support to pull up pants.  Pt continues to require min assist to maintain standing balance with cues for weight shift forward and to Lt.  Pt reports slight pain in Rt wrist, exacerbated with pushing for sit to stand.  Pain not rated, nsg notified.  Therapy/Group: Individual Therapy  Leonette Monarch 01/09/2011, 11:42 AM

## 2011-01-09 NOTE — Progress Notes (Signed)
Patient alert and oriented x3. Transfers 1-2+mod assist squat pivot to right side. Incontinent voids in brief. PEG in LUQ flushed with free water. Old trach site dry dressing changed, C.D.I. Scant serous drainage. Full supervision with meals. D3 nectar thick carb mod diet. No complaints of pain currently. Will continue with plan of care.

## 2011-01-09 NOTE — Progress Notes (Signed)
Physical Therapy Session Note  Patient Details  Name: [name] MRN: [id number] Date of Birth: 10-Nov-1955  Today's Date: 01/09/2011 Time: 1030-1124 Time Calculation (min): 54 min  Precautions: Precautions Precautions: Fall Precaution Comments: monitor O2 during activity Required Braces or Orthoses: No Restrictions Weight Bearing Restrictions: No Other Position/Activity Restrictions: posterior lean  Short Term Goals: PT Short Term Goal 1: Patient will perform bed mobility and supine-sit with supervision. PT Short Term Goal 1 - Progress: Progressing toward goal PT Short Term Goal 2: Patient will transfer with min assist 3 of 5 trials. PT Short Term Goal 2 - Progress: Progressing toward goal PT Short Term Goal 3: Patient will maintain dynamic standing balance with min assist > 5 min for ADL task. PT Short Term Goal 3 - Progress: Progressing toward goal PT Short Term Goal 4: Patient will gait 15 feet with max assist. PT Short Term Goal 4 - Progress: Progressing toward goal PT Short Term Goal 5: Patient will propel W/C > 100 feet with min assist. PT Short Term Goal 5 - Progress: Progressing toward goal  Skilled Therapeutic Interventions/Progress Updates: Pt reported no pain.  O2 sats = 90-91% during treatment, on room air.  Treatment focused on transfers, w/c propulsion, neuromuscular re-ed, pre-gait and gait activities.  W/c mobility x 100' x 2 with min A for steering, using bil LEs.  In sitting, VCs and manual cues for leaning forward, lowering head to facilitate lifting hips, x 8.  In sitting, reaching activities with L then R hand, focusing on coordination for extending hand to target, and manipulating stacking cups, with VCs to weight shifting forward and L><R, with S for balance. Pr dropped 0/10 cups L hand, 3/10 R hand.  Standing balance using mirror for visual feedback, manual and VCs for postural control to decrease backward trunk leaning, with mod A during RUE reaching  activity, promoting full R extension with wt shifting.   Gait training with Carley Hammed walker, +2 A for postural control, VCs for increasing stance width, extending bil hips, x 10'.  Further gait training in parallel bars, facilitating wider stance with board between feet, x 8' x 2 each forward and backward.     Therapy/Group: Individual Therapy  Pollie Poma 01/09/2011, 11:52 AM

## 2011-01-09 NOTE — Progress Notes (Signed)
Patient ID: [name], male   DOB: Nov 09, 1955, 55 y.o.   MRN: [id number] Subjective/ComplaintsReview of Systems  HENT: Negative for congestion.        Trach removed  Respiratory: Negative.   Gastrointestinal: Negative for nausea, abdominal pain and diarrhea.       PEG  Genitourinary: Negative for frequency and hematuria.       Condom cath  All other systems reviewed and are negative.   Larey Seat out of bed last noc, tried to get up without help.  Pt denies injury.  He is oriented without c/os Objective: Vital Signs: Blood pressure 135/85, pulse 91, temperature 98.1 F (36.7 C), temperature source Oral, resp. rate 20, height 6' (1.829 m), weight 95.7 kg (210 lb 15.7 oz), SpO2 95.00%. No results found. Results for orders placed during the hospital encounter of 12/22/10 (from the past 72 hour(s))  GLUCOSE, CAPILLARY     Status: Abnormal   Collection Time   01/06/11 11:46 AM      Component Value Range Comment   Glucose-Capillary 165 (*) 70 - 99 (mg/dL)    Comment 1 Notify RN     GLUCOSE, CAPILLARY     Status: Abnormal   Collection Time   01/06/11  4:01 PM      Component Value Range Comment   Glucose-Capillary 136 (*) 70 - 99 (mg/dL)   GLUCOSE, CAPILLARY     Status: Abnormal   Collection Time   01/06/11  9:35 PM      Component Value Range Comment   Glucose-Capillary 166 (*) 70 - 99 (mg/dL)    Comment 1 Notify RN     URIC ACID     Status: Abnormal   Collection Time   01/07/11  6:00 AM      Component Value Range Comment   Uric Acid, Serum 8.3 (*) 4.0 - 7.8 (mg/dL)   GLUCOSE, CAPILLARY     Status: Abnormal   Collection Time   01/07/11  7:17 AM      Component Value Range Comment   Glucose-Capillary 148 (*) 70 - 99 (mg/dL)    Comment 1 Notify RN     GLUCOSE, CAPILLARY     Status: Abnormal   Collection Time   01/07/11 11:42 AM      Component Value Range Comment   Glucose-Capillary 137 (*) 70 - 99 (mg/dL)    Comment 1 Notify RN     GLUCOSE, CAPILLARY     Status: Abnormal   Collection Time   01/07/11  4:38 PM      Component Value Range Comment   Glucose-Capillary 152 (*) 70 - 99 (mg/dL)    Comment 1 Notify RN     GLUCOSE, CAPILLARY     Status: Abnormal   Collection Time   01/07/11  8:47 PM      Component Value Range Comment   Glucose-Capillary 178 (*) 70 - 99 (mg/dL)    Comment 1 Notify RN     BASIC METABOLIC PANEL     Status: Abnormal   Collection Time   01/08/11  6:45 AM      Component Value Range Comment   Sodium 139  135 - 145 (mEq/L)    Potassium 3.6  3.5 - 5.1 (mEq/L)    Chloride 101  96 - 112 (mEq/L)    CO2 26  19 - 32 (mEq/L)    Glucose, Bld 143 (*) 70 - 99 (mg/dL)    BUN 8  6 - 23 (mg/dL)  Creatinine, Ser 0.85  0.50 - 1.35 (mg/dL)    Calcium 9.4  8.4 - 10.5 (mg/dL)    GFR calc non Af Amer >90  >90 (mL/min)    GFR calc Af Amer >90  >90 (mL/min)   URIC ACID     Status: Abnormal   Collection Time   01/08/11  6:45 AM      Component Value Range Comment   Uric Acid, Serum 8.9 (*) 4.0 - 7.8 (mg/dL)   GLUCOSE, CAPILLARY     Status: Abnormal   Collection Time   01/08/11  7:23 AM      Component Value Range Comment   Glucose-Capillary 139 (*) 70 - 99 (mg/dL)    Comment 1 Notify RN     GLUCOSE, CAPILLARY     Status: Abnormal   Collection Time   01/08/11 11:29 AM      Component Value Range Comment   Glucose-Capillary 134 (*) 70 - 99 (mg/dL)    Comment 1 Notify RN     GLUCOSE, CAPILLARY     Status: Abnormal   Collection Time   01/08/11  4:22 PM      Component Value Range Comment   Glucose-Capillary 123 (*) 70 - 99 (mg/dL)    Comment 1 Notify RN     GLUCOSE, CAPILLARY     Status: Abnormal   Collection Time   01/08/11  8:42 PM      Component Value Range Comment   Glucose-Capillary 169 (*) 70 - 99 (mg/dL)    Comment 1 Notify RN     GLUCOSE, CAPILLARY     Status: Abnormal   Collection Time   01/09/11  7:20 AM      Component Value Range Comment   Glucose-Capillary 129 (*) 70 - 99 (mg/dL)    Comment 1 Notify RN         .  Patient  sitting comfortably in his Bed. Patient has weak cough  Trach site with granulation tissue at base, stoma is closed. He is fair oral motor control. No gross cranial nerve abnormalities I can see on exam today. Patient was able to follow simple one-step commands. He communicated with simple yes no and shakes.  He had diminished fine motor movement in all 4 limbs. He may have had some underlying ataxia on the right  Strength was grossly3/5 proximally in both upper limbs. Lower extremity grossly3/5 proximal to3/5 distally. Patient did have sensation to gross pain stimulation. Reflexes are 1+ grossly throughout. Patient had poor insight and and awareness. Heart was irregularly irregular. Chest was clear. Abdomen soft nontender. PEG site was clean and intact with no drainage. Skin throughout was generally intact. Mild edema in all 4 limbs at one plus,@+ LUE.All 4 limbs were warm with 2+ pulses.    Assessment/Plan: 1. Functional deficits secondary to R cerebellar ICH severe R UE and R LE ataxia which require 3+ hours per day of interdisciplinary therapy in a comprehensive inpatient rehab setting.  Team conf today Physiatrist is providing close team supervision and 24 hour management of active medical problems listed below. Physiatrist and rehab team continue to assess barriers to discharge/monitor patient progress toward functional and medical goals. Mobility: Bed Mobility Bed Mobility: Yes Rolling Right: 2: Max assist;With rail Rolling Left: 3: Mod assist Right Sidelying to Sit: With rails;2: Max assist Sitting - Scoot to Edge of Bed: 1: +1 Total assist;Other (comment) (significant posterior right lean) Transfers Sit to Stand: 1: +1 Total assist Sit to Stand Details (indicate  cue type and reason): significant posterior right lean, slow to initiate movement Stand to Sit: 1: +1 Total assist Stand Pivot Transfer Details (indicate cue type and reason): poor LE eccentric control with uncontrolled descent,  significant posterior right lean Squat Pivot Transfers: 1: +2 Total assist Squat Pivot Transfer Details (indicate cue type and reason): total assist + 2 (pt = 50%), poor graded movement and weight shift, slow to initiate with LEs Ambulation/Gait Stairs: No (pre-gait level at this time, unsafe to attempt) Wheelchair Mobility Wheelchair Mobility: No  ADL:    Cognition: Cognition Overall Cognitive Status: Impaired Arousal/Alertness: Awake/alert Orientation Level: Oriented to person;Oriented to place;Oriented to situation Attention: Sustained Sustained Attention: Impaired Sustained Attention Impairment: Functional basic Memory: Impaired Memory Impairment: Decreased recall of new information;Decreased long term memory Decreased Long Term Memory: Verbal basic;Functional basic Awareness: Impaired Awareness Impairment: Emergent impairment Problem Solving: Impaired Problem Solving Impairment: Functional basic Executive Function: Self Monitoring;Self Correcting Self Monitoring: Impaired Self Monitoring Impairment: Functional basic Self Correcting: Impaired Self Correcting Impairment: Verbal basic;Functional basic Safety/Judgment: Appears intact Cognition Arousal/Alertness: Awake/alert Orientation Level: Oriented to person;Oriented to place;Oriented to situation  2. Anticoagulation/DVT prophylaxis with Pharmaceutical:no anticoag use SCDs 3. Pain Management:monitor, tylenol for non-specific right foot pain. Patient Active Hospital Problem List: 4.  Afib cannot use warfarin due to ICH resume baby ASA Diabetes mellitus (11/19/2010)    POA: Yes 5.  LUE edema improving 6.   DM Lantus monitor CBG--good control at present. 7. Dysphagia nectar liquids plus H2O protocol    8.Gout (11/20/2010) -may be behind some of foot pain from yesterday.  Foot minimally tender on exam. Urate level elevated pt  on allopurinol 9..  Hypo K monitor no diuretics 10.  HTN controlled 18  KIRSTEINS,ANDREW  E 01/09/2011, 7:31 AM

## 2011-01-09 NOTE — Progress Notes (Signed)
Progress Notes  Speech Language Pathology Therapy Note  Patient Details  Name: [name] MRN: [id number] Date of Birth: [birth year]  Today's Date: 01/09/2011 Time: 1410-1440 Time Calculation (min): 30 min  Precautions: Precautions Precautions: Fall Precaution Comments: monitor O2 during activity Required Braces or Orthoses: No Restrictions Weight Bearing Restrictions: No Other Position/Activity Restrictions: posterior lean  Short Term Goals: 1. Patient will consume dysphagia 3 (soft solids) and nectar-thick liquids showing no overt s/s of aspiration with the use of a chin tuck and other compensatory strategies with moderate assist  2. Patient will demonstrate day to day carryover of self care information with moderate assist semantic cues  3. Patient will demonstrate selective attention to task for 10-12 minutes with minimal assist semantic cues  4. Patient will initiate verbal expression with requests for help or for communicating information with moderate assist semantic cues  5. Patient will self monitor and correct vocal intensity with minimal assist semantic cues  Skilled Therapeutic Interventions/Progress Updates: Patient seen with brother present for skilled ST treatment with focus on cognitive-linguistic and speech goals. Patient attended to structured conversation and verbal reasoning for 7-10 minutes with minimal verbal redirection cues. Patient was oriented x4 and able to recall general and specific information related to therapy sessions/tasks completed today with ST providing min verbal context cues and visual cues (daily schedule, photos of therapists). Vocal intensity during this session was adequate, with minimal auditory/environmental distractions and no cues needed. Patient participated in ST-led conversation of his progress, current level of function, with patient describing adaptive equipment and strategies learned in PT/OT and ST therapy sessions with overall min-mod  verbal context cues. Precautions/Restrictions    General    Vital Signs   Pain Pain Assessment Pain Assessment: No/denies pain Pain Score: 0-No pain  Oral/Motor: Oral Motor/Sensory Function Labial ROM: Reduced left Labial Symmetry: Abnormal symmetry left;Abnormal symmetry right Labial Strength: Reduced Lingual ROM: Reduced right;Reduced left Lingual Strength: Reduced Motor Speech Intelligibility: Intelligibility reduced Comprehension: Auditory Comprehension Yes/No Questions: Impaired Basic Immediate Environment Questions: 75-100% accurate Complex Questions: 50-74% accurate Commands: Impaired Multistep Basic Commands: 50-74% accurate Conversation: Simple Interfering Components: Anxiety;Processing speed (trach with PMSV) EffectiveTechniques: Extra processing time;Repetition;Visual/Gestural cues Visual Recognition/Discrimination Discrimination: Exceptions to [facility] Reading Comprehension Reading Status: Impaired Interfering Components: Attention;Visual scanning;Processing time;Visual acuity;Eye glasses not available Effective Techniques: Large print;Other (comment) (simple print) Expression: Expression Primary Mode of Expression: Verbal Verbal Expression Initiation: Impaired Automatic Speech: Day of week;Month of year (DOW 6/7, MOY perseverated on DOW) Level of Generative/Spontaneous Verbalization: Word Repetition: No impairment Naming: Not tested Pragmatics: Impairment Impairments: Monotone;Eye contact Interfering Components: Attention Effective Techniques: Open ended questions;Semantic cues Non-Verbal Means of Communication: Not applicable Written Expression Written Expression: Not tested  Therapy/Group: Individual Therapy Angela Nevin [facility] Speech-Language Pathologist Elio Forget [name] 01/09/2011 2:53 PM

## 2011-01-10 DIAGNOSIS — J9600 Acute respiratory failure, unspecified whether with hypoxia or hypercapnia: Secondary | ICD-10-CM

## 2011-01-10 DIAGNOSIS — I69993 Ataxia following unspecified cerebrovascular disease: Secondary | ICD-10-CM

## 2011-01-10 DIAGNOSIS — Z5189 Encounter for other specified aftercare: Secondary | ICD-10-CM

## 2011-01-10 DIAGNOSIS — I6340 Cerebral infarction due to embolism of unspecified cerebral artery: Secondary | ICD-10-CM

## 2011-01-10 LAB — GLUCOSE, CAPILLARY
Glucose-Capillary: 116 mg/dL — ABNORMAL HIGH (ref 70–99)
Glucose-Capillary: 180 mg/dL — ABNORMAL HIGH (ref 70–99)

## 2011-01-10 MED ORDER — COLCHICINE 0.6 MG PO TABS
0.6000 mg | ORAL_TABLET | Freq: Two times a day (BID) | ORAL | Status: AC
  Administered 2011-01-10 – 2011-01-11 (×4): 0.6 mg via ORAL
  Filled 2011-01-10 (×6): qty 1

## 2011-01-10 NOTE — Progress Notes (Addendum)
Patient alert, oriented x3. Squat pivot for transfers, 1-2 moderate assistance. Takes medicine whole with puree. Peg tube flushed with water. Complaint of pain during activity to right wrist area. Warm compress applied. Will monitor. Medicated with tylenol, effective.

## 2011-01-10 NOTE — Progress Notes (Signed)
Physical Therapy Note  Patient Details  Name: GIANI BETZOLD MRN: [id number] Date of Birth: 01-Oct-1955 Today's Date: 01/10/2011 Time: 2130-8657 (45')  Precautions: Precautions  Precautions: Fall  Precaution Comments: monitor O2 during activity  Required Braces or Orthoses: No  Restrictions  Weight Bearing Restrictions: No  Other Position/Activity Restrictions: posterior lean  Short Term Goals:  PT Short Term Goal 1: Patient will perform bed mobility and supine-sit with supervision.  PT Short Term Goal 1 - Progress: Progressing toward goal  PT Short Term Goal 2: Patient will transfer with min assist 3 of 5 trials.  PT Short Term Goal 2 - Progress: Progressing toward goal  PT Short Term Goal 3: Patient will maintain dynamic standing balance with min assist > 5 min for ADL task.  PT Short Term Goal 3 - Progress: Progressing toward goal  PT Short Term Goal 4: Patient will gait 15 feet with max assist.  PT Short Term Goal 4 - Progress: Progressing toward goal  PT Short Term Goal 5: Patient will propel W/C > 100 feet with min assist.  PT Short Term Goal 5 - Progress: Progressing toward goal  Skilled Therapeutic Interventions/Progress Updates:   General  Chart Reviewed: Yes  Family/Caregiver Present: No   Pain  Pain Assessment  Pain Assessment: No/denies pain  Pain Score: 0-No pain  Other Treatments  Therapeutic Activity; (15') Patient was up a sink side in recliner chair with nursing to get cleaned up and verbalizing that he wanted to brush teeth.  Assisted patient with upper body dressing and to brush teeth. Transfer from recliner to w/c with min-A for maintaining balance with sidestepping to Right and verbal and tactile cues to reach armrest of w/c for safety with sitting.  Transfers sit<->stand multiple times with ongoing need for verbal and tactile cues for safety.  Patient with dysmetria on reach Therapeutic Exercise (15') B LE in supine and in sitting Gait Training (15') in  parallel bars with ambulation 2 x 20' and verbal/tactile cues for sequencing, posture and step length.  Marching in place and weight shifting in parallel bars with tactile and verbal cues for posture and hip stabilization as well as trunk control.  Individual Treatment Session; 9'   [name] 01/10/2011, 7:58 AM

## 2011-01-10 NOTE — Progress Notes (Signed)
Physical Therapy Note  Patient Details  Name: [name] MRN: [id number] Date of Birth: 03/24/1955 Today's Date: 01/10/2011 Time: 1300-1330 (30') Pain; patient denied pain at onset of Treatment session however, during treatment, while in parallel bars, patient noted            R wrist pain with weight bearing on palm.  Patient's brother [name] present for treatment session.  Nursing notified           and note left for MD.  Therapeutic Activity (15') Bed Mobility S/Mod-I and supine to sit with min-A to scoot to EOB. Transfer training bed to             w/c with S/min-A for hand placement and safety.  Sit<->stand with min-A for posture, balance and safety.   Neuromuscular Re-education (15') standing trunk control with B LE's planted and verbal cues to elongate trunk and to square hips which tend to drift to Right.  Single limb balance with use of B hands for support on parallel bars.  Patient noted pain at R wrist which was inspected and nursing notified as well as MD.  Patient still needing better balance and control when going from stand->sit due to imbalance/motor control and needs min-A and verbal cues to correct for imbalance.  Individual Treatment Session   [name] 01/10/2011, 1:04 PM

## 2011-01-10 NOTE — Progress Notes (Signed)
Occupational Therapy Note  Patient Details  Name: [name] MRN: [id number] Date of Birth: 05/23/1955 Today's Date: 01/10/2011  1st Session Time: 1000-1100 Individual Therapy Pt denies pain  Pt already dressed when therapist arrived but stated that he needed to change his "pamper" and wash.  Pt required min verbal cues for positioning and sequencing with squat pivot transfer to w/c.  Pt unaware of bowel incontinence and required assistance for hygiene.  Pt stood at sink approx 5 mins with BUE support.  Pt exhibited increased difficulty with threading pants but able to don socks.  Focus on static and dynamic standing balance, activity tolerance, and safety awareness.  Pt requires min verbal cues for LE positioning with transfers and standing.   2nd Session Time: 1430-1530 Individual Therapy Pain: Right wrist rated at 5; RN  Aware  Pt's brother present to observe therapy.  Practiced tub bench transfers with squat pivot at mod A and BSC transfers at mod A.  Pt engaged in standing activities with emphasis on shifting weight to toes.  Pt hesitant to pick feet up to shift weight, scooting feet on floor instead.  Pt engaged in BUE activity assembling objects from pictures.  Pt self corrected construction when he noticed he made a mistake.  Lavone Neri [facility] 01/10/2011, 3:45 PM

## 2011-01-10 NOTE — Progress Notes (Addendum)
Patient ID: BENGIE KAUCHER, male   DOB: 02/07/1955, 55 y.o.   MRN: [id number] Patient ID: DEKKER VERGA, male   DOB: [birth year], 55 y.o.   MRN: [id number] Subjective/ComplaintsReview of Systems  HENT: Negative for congestion.        Trach removed  Respiratory: Negative.   Gastrointestinal: Negative for nausea, abdominal pain and diarrhea.       PEG  Genitourinary: Negative for frequency and hematuria.       Condom cath  All other systems reviewed and are negative.   Complains of right wrist pain.  Objective: Vital Signs: Blood pressure 167/91, pulse 81, temperature 98.4 F (36.9 C), temperature source Oral, resp. rate 19, height 6' (1.829 m), weight 97.7 kg (215 lb 6.2 oz), SpO2 97.00%. No results found. Results for orders placed during the hospital encounter of 12/22/10 (from the past 72 hour(s))  GLUCOSE, CAPILLARY     Status: Abnormal   Collection Time   01/07/11 11:42 AM      Component Value Range Comment   Glucose-Capillary 137 (*) 70 - 99 (mg/dL)    Comment 1 Notify RN     GLUCOSE, CAPILLARY     Status: Abnormal   Collection Time   01/07/11  4:38 PM      Component Value Range Comment   Glucose-Capillary 152 (*) 70 - 99 (mg/dL)    Comment 1 Notify RN     GLUCOSE, CAPILLARY     Status: Abnormal   Collection Time   01/07/11  8:47 PM      Component Value Range Comment   Glucose-Capillary 178 (*) 70 - 99 (mg/dL)    Comment 1 Notify RN     BASIC METABOLIC PANEL     Status: Abnormal   Collection Time   01/08/11  6:45 AM      Component Value Range Comment   Sodium 139  135 - 145 (mEq/L)    Potassium 3.6  3.5 - 5.1 (mEq/L)    Chloride 101  96 - 112 (mEq/L)    CO2 26  19 - 32 (mEq/L)    Glucose, Bld 143 (*) 70 - 99 (mg/dL)    BUN 8  6 - 23 (mg/dL)    Creatinine, Ser 7.82  0.50 - 1.35 (mg/dL)    Calcium 9.4  8.4 - 10.5 (mg/dL)    GFR calc non Af Amer >90  >90 (mL/min)    GFR calc Af Amer >90  >90 (mL/min)   URIC ACID     Status: Abnormal   Collection Time   01/08/11   6:45 AM      Component Value Range Comment   Uric Acid, Serum 8.9 (*) 4.0 - 7.8 (mg/dL)   GLUCOSE, CAPILLARY     Status: Abnormal   Collection Time   01/08/11  7:23 AM      Component Value Range Comment   Glucose-Capillary 139 (*) 70 - 99 (mg/dL)    Comment 1 Notify RN     GLUCOSE, CAPILLARY     Status: Abnormal   Collection Time   01/08/11 11:29 AM      Component Value Range Comment   Glucose-Capillary 134 (*) 70 - 99 (mg/dL)    Comment 1 Notify RN     GLUCOSE, CAPILLARY     Status: Abnormal   Collection Time   01/08/11  4:22 PM      Component Value Range Comment   Glucose-Capillary 123 (*) 70 - 99 (mg/dL)    Comment 1  Notify RN     GLUCOSE, CAPILLARY     Status: Abnormal   Collection Time   01/08/11  8:42 PM      Component Value Range Comment   Glucose-Capillary 169 (*) 70 - 99 (mg/dL)    Comment 1 Notify RN     GLUCOSE, CAPILLARY     Status: Abnormal   Collection Time   01/09/11  7:20 AM      Component Value Range Comment   Glucose-Capillary 129 (*) 70 - 99 (mg/dL)    Comment 1 Notify RN     GLUCOSE, CAPILLARY     Status: Abnormal   Collection Time   01/09/11 11:43 AM      Component Value Range Comment   Glucose-Capillary 175 (*) 70 - 99 (mg/dL)    Comment 1 Notify RN     GLUCOSE, CAPILLARY     Status: Abnormal   Collection Time   01/09/11  4:47 PM      Component Value Range Comment   Glucose-Capillary 128 (*) 70 - 99 (mg/dL)   GLUCOSE, CAPILLARY     Status: Abnormal   Collection Time   01/09/11  8:57 PM      Component Value Range Comment   Glucose-Capillary 154 (*) 70 - 99 (mg/dL)    Comment 1 Notify RN         .  Patient sitting comfortably in his Bed. Patient has weak cough  Trach site with granulation tissue at base, stoma is closed. He is fair oral motor control. No gross cranial nerve abnormalities I can see on exam today. Patient was able to follow simple one-step commands. He communicated with simple yes no and shakes.  He had diminished fine motor  movement in all 4 limbs. He may have had some underlying ataxia on the right  Strength was grossly3+/5 proximally in both upper limbs. Lower extremity grossly3/5 proximal to3/5 distally. Patient did have sensation to gross pain stimulation. Reflexes are 1+ grossly throughout. Patient had poor insight and and awareness. Heart was irregularly irregular. Chest was clear. Abdomen soft nontender. PEG site was clean and intact with no drainage. Skin throughout was generally intact. Trace extremity edema. All 4 limbs were warm with 2+ pulses. Right wrist is slightly warm and slightly tender but not swollen.  He has normal rom at wrist.   Assessment/Plan: 1. Functional deficits secondary to R cerebellar ICH severe R UE and R LE ataxia which require 3+ hours per day of interdisciplinary therapy in a comprehensive inpatient rehab setting.  Team conf today Physiatrist is providing close team supervision and 24 hour management of active medical problems listed below. Physiatrist and rehab team continue to assess barriers to discharge/monitor patient progress toward functional and medical goals. Mobility: Bed Mobility Bed Mobility: Yes Rolling Right: 2: Max assist;With rail Rolling Left: 3: Mod assist Right Sidelying to Sit: With rails;2: Max assist Sitting - Scoot to Edge of Bed: 1: +1 Total assist;Other (comment) (significant posterior right lean) Transfers Sit to Stand: 1: +1 Total assist Sit to Stand Details (indicate cue type and reason): significant posterior right lean, slow to initiate movement Stand to Sit: 1: +1 Total assist Stand Pivot Transfer Details (indicate cue type and reason): poor LE eccentric control with uncontrolled descent, significant posterior right lean Squat Pivot Transfers: 1: +2 Total assist Squat Pivot Transfer Details (indicate cue type and reason): total assist + 2 (pt = 50%), poor graded movement and weight shift, slow to initiate with LEs Ambulation/Gait Ambulation/Gait  Assistance: 1: +2 Total assist Stairs: No (pre-gait level at this time, unsafe to attempt) Wheelchair Mobility Wheelchair Mobility: No  ADL:    Cognition: Cognition Overall Cognitive Status: Impaired Arousal/Alertness: Awake/alert Orientation Level: Oriented to place;Oriented to situation;Disoriented to time;Other (Comment) Attention: Sustained Sustained Attention: Impaired Sustained Attention Impairment: Functional basic Memory: Impaired Memory Impairment: Decreased recall of new information;Decreased long term memory Decreased Long Term Memory: Verbal basic;Functional basic Awareness: Impaired Awareness Impairment: Emergent impairment Problem Solving: Impaired Problem Solving Impairment: Functional basic Executive Function: Self Monitoring;Self Correcting Self Monitoring: Impaired Self Monitoring Impairment: Functional basic Self Correcting: Impaired Self Correcting Impairment: Verbal basic;Functional basic Safety/Judgment: Appears intact Cognition Arousal/Alertness: Awake/alert Orientation Level: Oriented to place;Oriented to situation;Disoriented to time;Other (Comment)  2. Anticoagulation/DVT prophylaxis with Pharmaceutical:no anticoag use SCDs 3. Pain Management:monitor, tylenol for non-specific right foot pain. Patient Active Hospital Problem List: 4.  Afib cannot use warfarin due to ICH resume baby ASA Diabetes mellitus (11/19/2010)    POA: Yes 5.  LUE edema improving 6.   DM Lantus monitor CBG--good control at present. No change in regimen. 7. Dysphagia nectar liquids plus H2O protocol    8.Gout (11/20/2010) -may be behind some of foot pain from yesterday.  Foot minimally tender on exam. Urate level elevated pt  on allopurinol.  Right wrist pain likely gout as well.  Will add colchicine bid for 2 days.   9..  Hypo K monitor no diuretics. Recheck next week 10.  HTN controlled 19  Terik Haughey T 01/10/2011, 7:29 AM

## 2011-01-11 LAB — GLUCOSE, CAPILLARY
Glucose-Capillary: 128 mg/dL — ABNORMAL HIGH (ref 70–99)
Glucose-Capillary: 137 mg/dL — ABNORMAL HIGH (ref 70–99)

## 2011-01-11 NOTE — Progress Notes (Signed)
Occupational Therapy Note  Patient Details  Name: [name] MRN: [id number] Date of Birth: 07/01/1955 Today's Date: 01/11/2011 Time:  1500-1600  60 mins. group Pain:  None Group Therapy  Addressed functional balance in group activity.  Pt. Required manual facilitation and tactile cue to carry weight on forefoot to maintain balance.  He stood with max assist to maintain balance.  Educated pt on weight shifting.  Needs reinforcement.     Humberto Seals 01/11/2011, 5:35 PM

## 2011-01-11 NOTE — Progress Notes (Signed)
Patient ID: [name], male   DOB: 1955/09/09, 55 y.o.   MRN: [id number] Patient ID: [name], male   DOB: 10-03-1955, 55 y.o.   MRN: [id number] Patient ID: [name], male   DOB: [birth year], 55 y.o.   MRN: [id number] Subjective/ComplaintsReview of Systems  HENT: Negative for congestion.        Trach removed  Respiratory: Negative.   Gastrointestinal: Negative for nausea, abdominal pain and diarrhea.       PEG  Genitourinary: Negative for frequency and hematuria.       Condom cath  All other systems reviewed and are negative.   Complains of right wrist pain.  Objective: Vital Signs: Blood pressure 156/96, pulse 74, temperature 98.3 F (36.8 C), temperature source Oral, resp. rate 20, height 6' (1.829 m), weight 98.3 kg (216 lb 11.4 oz), SpO2 97.00%. No results found. Results for orders placed during the hospital encounter of 12/22/10 (from the past 72 hour(s))  GLUCOSE, CAPILLARY     Status: Abnormal   Collection Time   01/08/11 11:29 AM      Component Value Range Comment   Glucose-Capillary 134 (*) 70 - 99 (mg/dL)    Comment 1 Notify RN     GLUCOSE, CAPILLARY     Status: Abnormal   Collection Time   01/08/11  4:22 PM      Component Value Range Comment   Glucose-Capillary 123 (*) 70 - 99 (mg/dL)    Comment 1 Notify RN     GLUCOSE, CAPILLARY     Status: Abnormal   Collection Time   01/08/11  8:42 PM      Component Value Range Comment   Glucose-Capillary 169 (*) 70 - 99 (mg/dL)    Comment 1 Notify RN     GLUCOSE, CAPILLARY     Status: Abnormal   Collection Time   01/09/11  7:20 AM      Component Value Range Comment   Glucose-Capillary 129 (*) 70 - 99 (mg/dL)    Comment 1 Notify RN     GLUCOSE, CAPILLARY     Status: Abnormal   Collection Time   01/09/11 11:43 AM      Component Value Range Comment   Glucose-Capillary 175 (*) 70 - 99 (mg/dL)    Comment 1 Notify RN     GLUCOSE, CAPILLARY     Status: Abnormal   Collection Time   01/09/11  4:47 PM   Component Value Range Comment   Glucose-Capillary 128 (*) 70 - 99 (mg/dL)   GLUCOSE, CAPILLARY     Status: Abnormal   Collection Time   01/09/11  8:57 PM      Component Value Range Comment   Glucose-Capillary 154 (*) 70 - 99 (mg/dL)    Comment 1 Notify RN     GLUCOSE, CAPILLARY     Status: Abnormal   Collection Time   01/10/11  7:50 AM      Component Value Range Comment   Glucose-Capillary 139 (*) 70 - 99 (mg/dL)    Comment 1 Notify RN     GLUCOSE, CAPILLARY     Status: Abnormal   Collection Time   01/10/11 11:49 AM      Component Value Range Comment   Glucose-Capillary 116 (*) 70 - 99 (mg/dL)    Comment 1 Notify RN     GLUCOSE, CAPILLARY     Status: Abnormal   Collection Time   01/10/11  4:58 PM      Component Value  Range Comment   Glucose-Capillary 117 (*) 70 - 99 (mg/dL)    Comment 1 Notify RN     GLUCOSE, CAPILLARY     Status: Abnormal   Collection Time   01/10/11  8:14 PM      Component Value Range Comment   Glucose-Capillary 180 (*) 70 - 99 (mg/dL)    Comment 1 Notify RN     GLUCOSE, CAPILLARY     Status: Abnormal   Collection Time   01/11/11  7:31 AM      Component Value Range Comment   Glucose-Capillary 128 (*) 70 - 99 (mg/dL)    Comment 1 Notify RN         .  Patient sitting comfortably in his Bed. Patient has weak cough  Trach site with granulation tissue at base, stoma is closed. He is fair oral motor control. No gross cranial nerve abnormalities I can see on exam today. Patient was able to follow simple one-step commands. He communicated with simple yes no and shakes.  He had diminished fine motor movement in all 4 limbs. He may have had some underlying ataxia on the right  Strength was grossly3+/5 proximally in both upper limbs. Lower extremity grossly3/5 proximal to3/5 distally. Patient did have sensation to gross pain stimulation. Reflexes are 1+ grossly throughout. Patient had poor insight and and awareness. Heart was irregularly irregular. Chest was clear.  Abdomen soft nontender. PEG site was clean and intact with no drainage. Skin throughout was generally intact. Trace extremity edema. All 4 limbs were warm with 2+ pulses. Right wrist less tender today.   Assessment/Plan: 1. Functional deficits secondary to R cerebellar ICH severe R UE and R LE ataxia which require 3+ hours per day of interdisciplinary therapy in a comprehensive inpatient rehab setting.  Team conf today Physiatrist is providing close team supervision and 24 hour management of active medical problems listed below. Physiatrist and rehab team continue to assess barriers to discharge/monitor patient progress toward functional and medical goals. Mobility: Bed Mobility Bed Mobility: Yes Rolling Right: 2: Max assist;With rail Rolling Left: 3: Mod assist Right Sidelying to Sit: With rails;2: Max assist Sitting - Scoot to Edge of Bed: 1: +1 Total assist;Other (comment) (significant posterior right lean) Transfers Sit to Stand: 1: +1 Total assist Sit to Stand Details (indicate cue type and reason): significant posterior right lean, slow to initiate movement Stand to Sit: 1: +1 Total assist Stand Pivot Transfer Details (indicate cue type and reason): poor LE eccentric control with uncontrolled descent, significant posterior right lean Squat Pivot Transfers: 1: +2 Total assist Squat Pivot Transfer Details (indicate cue type and reason): total assist + 2 (pt = 50%), poor graded movement and weight shift, slow to initiate with LEs Ambulation/Gait Ambulation/Gait Assistance: 1: +2 Total assist Stairs: No (pre-gait level at this time, unsafe to attempt) Wheelchair Mobility Wheelchair Mobility: No  ADL:    Cognition: Cognition Overall Cognitive Status: Impaired Arousal/Alertness: Awake/alert Orientation Level: Oriented to person;Oriented to place;Oriented to situation;Disoriented to time Attention: Sustained Sustained Attention: Impaired Sustained Attention Impairment: Functional  basic Memory: Impaired Memory Impairment: Decreased recall of new information;Decreased long term memory Decreased Long Term Memory: Verbal basic;Functional basic Awareness: Impaired Awareness Impairment: Emergent impairment Problem Solving: Impaired Problem Solving Impairment: Functional basic Executive Function: Self Monitoring;Self Correcting Self Monitoring: Impaired Self Monitoring Impairment: Functional basic Self Correcting: Impaired Self Correcting Impairment: Verbal basic;Functional basic Safety/Judgment: Appears intact Cognition Arousal/Alertness: Awake/alert Orientation Level: Oriented to person;Oriented to place;Oriented to situation;Disoriented to time  2. Anticoagulation/DVT prophylaxis with Pharmaceutical:no anticoag use SCDs 3. Pain Management:monitor, tylenol for non-specific right foot pain. Patient Active Hospital Problem List: 4.  Afib cannot use warfarin due to ICH resume baby ASA Diabetes mellitus (11/19/2010)    POA: Yes 5.  LUE edema improving 6.   DM Lantus monitor CBG--good control at present. No change in regimen. 7. Dysphagia nectar liquids plus H2O protocol    8.Gout (11/20/2010) -may be behind some of foot pain from yesterday.  Foot minimally tender on exam. Urate level elevated pt  on allopurinol.  Right wrist feeling better today.  Continue colchicine bid thru today.   9..  Hypo K monitor no diuretics. Recheck next week 10.  HTN controlled 20  Christin Mccreedy T 01/11/2011, 9:02 AM

## 2011-01-11 NOTE — Progress Notes (Signed)
Patient alert and oriented x 3 - affect flat. Patient has some short term memory deficits. Patient bed alarm and quick release in chair. Patient has denied any pain this shift. Patient has been continent bladder- wears condom cath at night. Patient given mirilax this morning at 1039. Per tech report patient has medium stool today. Patient abdomen appears tight and obese- bowel sounds hypoactive. Patient peg tube insertion site crusted but intact. Patient able to stand pivot with 1 assist. Patient taking pills whole with applesauce. Patient has patch he alternates for double vision. No new complaints noted. Continue with plan of care.

## 2011-01-12 DIAGNOSIS — I6340 Cerebral infarction due to embolism of unspecified cerebral artery: Secondary | ICD-10-CM

## 2011-01-12 DIAGNOSIS — J9600 Acute respiratory failure, unspecified whether with hypoxia or hypercapnia: Secondary | ICD-10-CM

## 2011-01-12 DIAGNOSIS — I69993 Ataxia following unspecified cerebrovascular disease: Secondary | ICD-10-CM

## 2011-01-12 DIAGNOSIS — Z5189 Encounter for other specified aftercare: Secondary | ICD-10-CM

## 2011-01-12 LAB — GLUCOSE, CAPILLARY
Glucose-Capillary: 120 mg/dL — ABNORMAL HIGH (ref 70–99)
Glucose-Capillary: 121 mg/dL — ABNORMAL HIGH (ref 70–99)

## 2011-01-12 MED ORDER — COLCHICINE 0.6 MG PO TABS
0.6000 mg | ORAL_TABLET | Freq: Two times a day (BID) | ORAL | Status: AC
  Administered 2011-01-12 – 2011-01-14 (×4): 0.6 mg via ORAL
  Filled 2011-01-12 (×6): qty 1

## 2011-01-12 NOTE — Patient Care Conference (Signed)
Inpatient RehabilitationTeam Conference Note Date: 01/07/11   Time: 10:30 AM    Patient Name: [name]      Medical Record Number: [id number]  Date of Birth: 04-Apr-1955 Sex: Male         Room/Bed: 4030/4030-01 Payor Info: Payor: Advertising copywriter  Plan: [employer]  Product Type: *No Product type*     Admitting Diagnosis: L CEREBELLAR CVA  Admit Date/Time:  12/22/2010  4:53 PM Admission Comments: No comment available   Primary Diagnosis:  <principal problem not specified> Principal Problem: <principal problem not specified>  Patient Active Problem List  Diagnoses Date Noted  . ICH (intracerebral hemorrhage) 12/22/2010  . Hypernatremia 12/22/2010  . Physical deconditioning 12/22/2010  . Hyperlipemia 11/21/2010  . Gout 11/20/2010  . CAD (coronary artery disease) 11/20/2010  . Stroke 11/19/2010  . Diabetes mellitus 11/19/2010  . Respiratory failure 11/19/2010  . A-fib 11/19/2010    Expected Discharge Date: Expected Discharge Date: 01/19/11  Team Members Present: Physician: Dr. Claudette Laws Case Manager Present: Lutricia Horsfall, RN Social Worker Present: Dossie Der, LCSW PT Present: Illene Bolus, PT OT Present: Leonette Monarch, Felipa Eth, OT SLP Present: Fae Pippin, SLP RN: Hartford Poli, LPN     Current Status/Progress Goal Weekly Team Focus  Medical     fell out of bed last noc, tried to get up no injury, trach is out, d/c TF     Remove PEG prior to D/C      maintain calories and hydration without PEG, was getting TF TID   Bowel/Bladder     SEE individual  TEAM CONFERENCE NOTES in TC "Documentation"         Swallow/Nutrition/ Hydration     "   SEE individual  TEAM CONFERENCE NOTES in TC "Documentation"           ADL's        SEE individual  TEAM CONFERENCE NOTES in TC "Documentation"           Mobility        SEE individual  TEAM CONFERENCE NOTES in TC "Documentation"           Communication        SEE individual  TEAM  CONFERENCE NOTES in TC "Documentation"           Safety/Cognition/ Behavioral Observations       SEE individual  TEAM CONFERENCE NOTES in TC "Documentation"           Pain        SEE individual  TEAM CONFERENCE NOTES in TC "Documentation"           Skin        SEE individual  TEAM CONFERENCE NOTES in TC "Documentation"              *See Interdisciplinary Assessment and Plan and progress notes for long and short-term goals  Barriers to Discharge:"   poor awareness     Possible Resolutions to Barriers: "   Cont SLP    Discharge Planning/Teaching Needs:    Home with brother and siblings to assist. Brother here and participating in therapies.      Team Discussion:  Tube feeding d/c'd. Plan to remove PEG next week.Trach site healing. Baclofen effective for hiccups. Pt's brother has been participating in care.  Revisions to Treatment Plan:  none  Continued Need for Acute Rehabilitation Level of Care: The patient requires daily medical management by a physician with specialized training in physical medicine and rehabilitation  for the following conditions: Daily direction of a multidisciplinary physical rehabilitation program to ensure safe treatment while eliciting the highest outcome that is of practical value to the patient.: Yes Daily medical management of patient stability for increased activity during participation in an intensive rehabilitation regime.: Yes Daily analysis of laboratory values and/or radiology reports with any subsequent need for medication adjustment of medical intervention for : Neurological problems  Met with pt and his brother/CG on 01/07/11 and reported on team conference. Continue with plan for d/c 01/19/11. [name] 01/12/2011, 5:38 PM

## 2011-01-12 NOTE — Progress Notes (Signed)
Patient alert and oriented x3 today. Transfers mod assist stand pivot with one person from between wheelchair and bed and to toilet. Uses grab bar for assist and requires assistance with hygiene and dressing during toileting. LBM x2 12/31 in BR toilet. Wears eye patch for double vision alternating eyes. Trach site dressing changed today, dry dressing, with scant serous drainage on old dressing noted. PEG tube flushed with free water, site clean dry intact. Water protocol done today. Brother in room and ok to supervise with meals. Will continue with plan of care.

## 2011-01-12 NOTE — Progress Notes (Signed)
Patient ID: IZYK MARTY, male   DOB: 1955-11-04, 55 y.o.   MRN: [id number] Subjective/ComplaintsReview of Systems  HENT: Negative for congestion.        Trach removed  Respiratory: Negative.   Gastrointestinal: Negative for nausea, abdominal pain and diarrhea.       PEG  Genitourinary: Negative for frequency and hematuria.       Condom cath  All other systems reviewed and are negative.   PT notes  right wrist pain but pt c/o of R foot pain.  Objective: Vital Signs: Blood pressure 147/96, pulse 76, temperature 97.6 F (36.4 C), temperature source Oral, resp. rate 19, height 6' (1.829 m), weight 98.7 kg (217 lb 9.5 oz), SpO2 100.00%. No results found. Results for orders placed during the hospital encounter of 12/22/10 (from the past 72 hour(s))  GLUCOSE, CAPILLARY     Status: Abnormal   Collection Time   01/09/11 11:43 AM      Component Value Range Comment   Glucose-Capillary 175 (*) 70 - 99 (mg/dL)    Comment 1 Notify RN     GLUCOSE, CAPILLARY     Status: Abnormal   Collection Time   01/09/11  4:47 PM      Component Value Range Comment   Glucose-Capillary 128 (*) 70 - 99 (mg/dL)   GLUCOSE, CAPILLARY     Status: Abnormal   Collection Time   01/09/11  8:57 PM      Component Value Range Comment   Glucose-Capillary 154 (*) 70 - 99 (mg/dL)    Comment 1 Notify RN     GLUCOSE, CAPILLARY     Status: Abnormal   Collection Time   01/10/11  7:50 AM      Component Value Range Comment   Glucose-Capillary 139 (*) 70 - 99 (mg/dL)    Comment 1 Notify RN     GLUCOSE, CAPILLARY     Status: Abnormal   Collection Time   01/10/11 11:49 AM      Component Value Range Comment   Glucose-Capillary 116 (*) 70 - 99 (mg/dL)    Comment 1 Notify RN     GLUCOSE, CAPILLARY     Status: Abnormal   Collection Time   01/10/11  4:58 PM      Component Value Range Comment   Glucose-Capillary 117 (*) 70 - 99 (mg/dL)    Comment 1 Notify RN     GLUCOSE, CAPILLARY     Status: Abnormal   Collection Time     01/10/11  8:14 PM      Component Value Range Comment   Glucose-Capillary 180 (*) 70 - 99 (mg/dL)    Comment 1 Notify RN     GLUCOSE, CAPILLARY     Status: Abnormal   Collection Time   01/11/11  7:31 AM      Component Value Range Comment   Glucose-Capillary 128 (*) 70 - 99 (mg/dL)    Comment 1 Notify RN     GLUCOSE, CAPILLARY     Status: Abnormal   Collection Time   01/11/11 11:53 AM      Component Value Range Comment   Glucose-Capillary 137 (*) 70 - 99 (mg/dL)    Comment 1 Notify RN     GLUCOSE, CAPILLARY     Status: Abnormal   Collection Time   01/11/11  4:57 PM      Component Value Range Comment   Glucose-Capillary 147 (*) 70 - 99 (mg/dL)    Comment 1 Notify RN  GLUCOSE, CAPILLARY     Status: Abnormal   Collection Time   01/11/11  8:41 PM      Component Value Range Comment   Glucose-Capillary 150 (*) 70 - 99 (mg/dL)    Comment 1 Notify RN     GLUCOSE, CAPILLARY     Status: Abnormal   Collection Time   01/12/11  7:35 AM      Component Value Range Comment   Glucose-Capillary 120 (*) 70 - 99 (mg/dL)    Comment 1 Notify RN         .  Patient sitting comfortably in his Bed. Patient has weak cough  Trach site with granulation tissue at base, stoma is closed. He is fair oral motor control. No gross cranial nerve abnormalities I can see on exam today. Patient was able to follow simple one-step commands. He communicated with simple yes no and shakes.  He had diminished fine motor movement in all 4 limbs. He may have had some underlying ataxia on the right  Strength was grossly3+/5 proximally in both upper limbs. Lower extremity grossly3/5 proximal to3/5 distally. Patient did have sensation to gross pain stimulation. Reflexes are 1+ grossly throughout. Patient had poor insight and and awareness. Heart was irregularly irregular. Chest was clear. Abdomen soft nontender. PEG site was clean and intact with no drainage. Skin throughout was generally intact. Trace extremity edema. All 4  limbs were warm with 2+ pulses. Right wrist no tenderness to palp or with ROM R foot swelling no erythema, mild pain with ROM  Assessment/Plan: 1. Functional deficits secondary to R cerebellar ICH severe R UE and R LE ataxia which require 3+ hours per day of interdisciplinary therapy in a comprehensive inpatient rehab setting.  Team conf today Physiatrist is providing close team supervision and 24 hour management of active medical problems listed below. Physiatrist and rehab team continue to assess barriers to discharge/monitor patient progress toward functional and medical goals. Mobility: Bed Mobility Bed Mobility: Yes Rolling Right: 2: Max assist;With rail Rolling Left: 3: Mod assist Right Sidelying to Sit: With rails;2: Max assist Sitting - Scoot to Edge of Bed: 1: +1 Total assist;Other (comment) (significant posterior right lean) Transfers Sit to Stand: 1: +1 Total assist Sit to Stand Details (indicate cue type and reason): significant posterior right lean, slow to initiate movement Stand to Sit: 1: +1 Total assist Stand Pivot Transfer Details (indicate cue type and reason): poor LE eccentric control with uncontrolled descent, significant posterior right lean Squat Pivot Transfers: 1: +2 Total assist Squat Pivot Transfer Details (indicate cue type and reason): total assist + 2 (pt = 50%), poor graded movement and weight shift, slow to initiate with LEs Ambulation/Gait Ambulation/Gait Assistance: 1: +2 Total assist Stairs: No (pre-gait level at this time, unsafe to attempt) Wheelchair Mobility Wheelchair Mobility: No  ADL:    Cognition: Cognition Overall Cognitive Status: Impaired Arousal/Alertness: Awake/alert Orientation Level: Oriented to person;Oriented to place;Oriented to situation Attention: Sustained Sustained Attention: Impaired Sustained Attention Impairment: Functional basic Memory: Impaired Memory Impairment: Decreased recall of new information;Decreased long  term memory Decreased Long Term Memory: Verbal basic;Functional basic Awareness: Impaired Awareness Impairment: Emergent impairment Problem Solving: Impaired Problem Solving Impairment: Functional basic Executive Function: Self Monitoring;Self Correcting Self Monitoring: Impaired Self Monitoring Impairment: Functional basic Self Correcting: Impaired Self Correcting Impairment: Verbal basic;Functional basic Safety/Judgment: Appears intact Cognition Arousal/Alertness: Awake/alert Orientation Level: Oriented to person;Oriented to place;Oriented to situation  2. Anticoagulation/DVT prophylaxis with Pharmaceutical:no anticoag use SCDs 3. Pain Management:monitor, tylenol for non-specific  right foot pain. Patient Active Hospital Problem List: 4.  Afib cannot use warfarin due to ICH resume baby ASA Diabetes mellitus (11/19/2010)    POA: Yes 5.  LUE edema improving 6.   DM Lantus monitor CBG--good control at present. No change in regimen. 7. Dysphagia nectar liquids plus H2O protocol    8.Gout (11/20/2010) -may be behind some of foot pain from yesterday.  Foot minimally tender on exam. Urate level elevated pt  on allopurinol.  Right wrist feeling better today.  Continue colchicine bid thru tomorrow               9..  Hypo K monitor no diuretics. Recheck tomorrow      10.  HTN controlled 21  KIRSTEINS,ANDREW E 01/12/2011, 7:57 AM

## 2011-01-12 NOTE — Progress Notes (Signed)
Progress Notes  Speech Language Pathology Therapy Note  Patient Details  Name: [name] MRN: [id number] Date of Birth: 1955-08-13  Today's Date: 01/12/2011 Time: 4098-1191 Time Calculation (min): 40 min  Precautions: Precautions Precautions: Fall Precaution Comments: monitor O2 during activity Required Braces or Orthoses: No Restrictions Weight Bearing Restrictions: No Other Position/Activity Restrictions: posterior lean  Short Term Goals: set 01/02/11  1. Patient will consume dysphagia 3 (soft solids) and nectar-thick liquids showing no overt s/s of aspiration with the use of a chin tuck and other compensatory strategies with moderate assist (Goal Met) 2. Patient will demonstrate day to day carryover of self care information with moderate assist semantic cues (Goal Met) 3. Patient will demonstrate selective attention to task fr 10-12 minutes with minimal assist semantic cues (Goal Met) 4. Patient will initiate verbal expression with requests for help or for communicating information with moderate assist semantic cues (Goal Met) 5. Patient will self monitor and correct vocal intensity with minimal assist semantic cues (Goal Met)  Skilled Therapeutic Interventions: Session focused on sequencing 4 step picture cards with medium sized objects with minimal assist semantic cues to verbally retell events and sequence accurately with no attempts to self correct errors even after they were identified. Patient's verbal errors were characterized by semantic paraphasias that he corrected with minimal assist semantic cues.  RN administered medication and SLP facilitated recall of function of medication with max assist semantic cues  Progress Updates: Patient met 5 out of 5 short terms goals this reporting period with gains on attention to self feeding with distractions, initiation of verbal expression and recall of information.  Patient continues to require cues to use compensatory strategies  to facilitate safety with swallowing.  Patient continues to demonstrate deficits in the areas of recall of information and initiation of verbal expression with decreased self monitoring.  Additionally, patient would benefit from an objective swallow study prior to discharge to assist in determining if silent aspiration of thin liquids persists, as well as continued family education to ensure safety upon discharge home with brother.      Short Term Goals: set 01/12/11 1. Patient will consume dysphagia 3 (soft solids) and nectar-thick liquids showing no overt s/s of aspiration with the use of a chin tuck and other compensatory strategies with supervision assist  2. Patient will demonstrate day to day carryover of self care information with minimal assist semantic cues  3. Patient will demonstrate selective attention to task for 30 minutes with supervision assist semantic cues  4. Patient will initiate verbal expression with requests for help or for communicating information with minimal assist semantic cues  5. Patient will self monitor and correct verbal expression with supervision assist semantic cues   Pain Pain Assessment Pain Assessment: No/denies pain Pain Score: 0-No pain  Oral/Motor: Oral Motor/Sensory Function Labial ROM: Reduced left Labial Symmetry: Abnormal symmetry left;Abnormal symmetry right Labial Strength: Reduced Lingual ROM: Reduced right;Reduced left Lingual Strength: Reduced Motor Speech Articulation: Impaired (supervision cues) Intelligibility: Intelligibility reduced (supervision cues) Comprehension: Auditory Comprehension Yes/No Questions: Impaired Basic Immediate Environment Questions: 75-100% accurate Complex Questions: 75-100% accurate Commands: Impaired Multistep Basic Commands: 75-100% accurate Conversation: Simple Interfering Components: Anxiety;Processing speed (trach with PMSV) EffectiveTechniques: Extra processing time;Repetition Visual  Recognition/Discrimination Discrimination: Exceptions to WFL (no glasses or contacts) Reading Comprehension Reading Status: Impaired Interfering Components: Attention;Visual scanning;Processing time;Visual acuity;Eye glasses not available Effective Techniques: Large print;Other (comment) (simple print) Expression: Expression Primary Mode of Expression: Verbal Verbal Expression Overall Verbal Expression: Impaired Initiation: Impaired (minimal  cues) Automatic Speech: Day of week;Month of year (DOW 6/7, MOY perseverated on DOW) Level of Generative/Spontaneous Verbalization: Word Repetition: No impairment Naming: Not tested Pragmatics: Impairment Impairments: Monotone;Eye contact Interfering Components: Attention Effective Techniques: Open ended questions;Semantic cues Non-Verbal Means of Communication: Not applicable Written Expression Written Expression: Exceptions to [facility] Self Formulation Ability: Word (moderate cues for organization)  Therapy/Group: Individual Therapy  Charlane Ferretti., CCC-SLP [phone] [place] 01/12/2011 10:30 AM

## 2011-01-12 NOTE — Progress Notes (Signed)
Occupational Therapy Session Note  Patient Details  Name: [name] MRN: [id number] Date of Birth: [birth year]  Today's Date: 01/12/2011 Time: 5284-1324 Time Calculation (min): 57 min  Precautions: Precautions Precautions: Fall Precaution Comments: monitor O2 during activity Required Braces or Orthoses: No Restrictions Weight Bearing Restrictions: No Other Position/Activity Restrictions: posterior lean  Short Term Goals: OT Short Term Goal 1: Pt will complete bathing with mod assist in seated position OT Short Term Goal 1 - Progress: Met OT Short Term Goal 2: Pt will complete UB dressing with min assist  OT Short Term Goal 2 - Progress: Met OT Short Term Goal 3: Pt will complete LB dressing with max assist in sit to stand position OT Short Term Goal 3 - Progress: Met OT Short Term Goal 4: Pt will demonstrate improved unsupported sitting balance for 10 mins to complete UB bathing and dressing OT Short Term Goal 4 - Progress: Met  Skilled Therapeutic Interventions/Progress Updates:    Engaged in ADL retraining at sink from w/c level.  Focus on bed mobility, sit to stand, transfers to/from drop arm commode, pericare, and transfer training with brother Molly Maduro.  Pt demonstrated improved squat pivot transfers this session requiring mod assist initially and min assist for the rest of the transfers.  Improved standing balance with decreased posterior lean.  Pt with insight and reporting need to use toilet.  Transfer training with Molly Maduro from w/c to and from bed.  Pt and brother safe with this transfer. Pain Pain Assessment Pain Assessment: No/denies pain Pain Score: 0-No pain ADL See FIM for details  Therapy/Group: Individual Therapy  Leonette Monarch 01/12/2011, 12:01 PM

## 2011-01-12 NOTE — Progress Notes (Signed)
Speech Language Pathology [place] Note  Patient Details  Name: [name] MRN: [id number] Date of Birth: 01-17-1955  Today's Date: 01/12/2011 Time: 1130-1155 Time Calculation (min): 25 min  Precautions: Precautions Precautions: Fall Precaution Comments: monitor O2 during activity Required Braces or Orthoses: No Restrictions Weight Bearing Restrictions: No Other Position/Activity Restrictions: posterior lean  Skilled Therapeutic Interventions: Pt consumed Dys. 3 textures with nectar thick liquids. Pt without overt s/s of aspiration but max A verbal and visual cues for small bites and to utilize chin tuck. Continue current diet and full supervision for utilization of swallowing compensatory strategies.   Pain Pain Assessment Pain Assessment: No/denies pain Pain Score: 0-No pain   Therapy/Group: Individual Therapy  Nikeia Henkes 01/12/2011 1:24 PM

## 2011-01-12 NOTE — Progress Notes (Signed)
Physical Therapy Session Note  Patient Details  Name: [name] MRN: [id number] Date of Birth: [birth year]  Today's Date: 01/12/2011 Time: 1300-1355 Time Calculation (min): 55 min  Precautions: Precautions Precautions: Fall Precaution Comments: monitor O2 during activity Required Braces or Orthoses: No Restrictions Weight Bearing Restrictions: No Other Position/Activity Restrictions: posterior lean  Short Term Goals: PT Short Term Goal 1: Patient will perform bed mobility and supine-sit with supervision. PT Short Term Goal 1 - Progress: Progressing toward goal PT Short Term Goal 2: Patient will transfer with min assist 3 of 5 trials. PT Short Term Goal 2 - Progress: Progressing toward goal PT Short Term Goal 3: Patient will maintain dynamic standing balance with min assist > 5 min for ADL task. PT Short Term Goal 3 - Progress: Progressing toward goal PT Short Term Goal 4: Patient will gait 15 feet with max assist. PT Short Term Goal 4 - Progress: Progressing toward goal PT Short Term Goal 5: Patient will propel W/C > 100 feet with min assist. PT Short Term Goal 5 - Progress: Progressing toward goal  Skilled Therapeutic Interventions/Progress Updates: Therapeutic activities to facilitate midline standing during functional activities; gait training with appropriate assistive device           Pain No complaint of wrist pain today ; pt reports minimal pain Rt foot- premedicated Mobility Sit to stand - min/mod assist with Rt lean; transfer wc to toilet with safety bars stand pivot mod assist   Locomotion - 25 feet Eva walker mod/max assist with narrow base of support (bilateral adduction) with attempts to correct with verbal cues ; gait in parallel bars using board between feet to prevent scissoring forward/backward mod assist.    Trunk/Postural Assessment  Standing- performing reaching activities to cabinet mod assist with lean to right and adduction of RT LE during reach to  Rt.             Other Treatments  Standing to counter with Lt foot on 4 inch step to facilitate increased weight bearing Rt LE without lateral lean to right  Therapy/Group: Individual Therapy  Cortlin Marano,JIM 01/12/2011, 2:00 PM

## 2011-01-13 LAB — GLUCOSE, CAPILLARY
Glucose-Capillary: 110 mg/dL — ABNORMAL HIGH (ref 70–99)
Glucose-Capillary: 138 mg/dL — ABNORMAL HIGH (ref 70–99)

## 2011-01-13 LAB — BASIC METABOLIC PANEL
BUN: 11 mg/dL (ref 6–23)
Creatinine, Ser: 0.92 mg/dL (ref 0.50–1.35)
Glucose, Bld: 111 mg/dL — ABNORMAL HIGH (ref 70–99)
Potassium: 3.1 mEq/L — ABNORMAL LOW (ref 3.5–5.1)
Sodium: 138 mEq/L (ref 135–145)

## 2011-01-13 MED ORDER — POTASSIUM CHLORIDE CRYS ER 20 MEQ PO TBCR
30.0000 meq | EXTENDED_RELEASE_TABLET | Freq: Two times a day (BID) | ORAL | Status: DC
  Administered 2011-01-13 – 2011-01-19 (×11): 30 meq via ORAL
  Filled 2011-01-13 (×14): qty 1

## 2011-01-13 NOTE — Progress Notes (Signed)
Pt alert and oriented.  Pt out of bed with two assist to the chair.  Pt denies pain.  PEG tube patent. Pt set up for meals and ate well. Pt was incontinent of a small bowel movement in brief today [name]

## 2011-01-13 NOTE — Progress Notes (Signed)
Patient ID: [name], male   DOB: 10-10-1955, 56 y.o.   MRN: [id number] Subjective/ComplaintsReview of Systems  HENT: Negative for congestion.        Trach removed  Respiratory: Negative.   Gastrointestinal: Negative for nausea, abdominal pain and diarrhea.       PEG  Genitourinary: Negative for frequency and hematuria.       Condom cath  All other systems reviewed and are negative.   PT notes  right wrist pain but pt c/o of R foot pain.  Objective: Vital Signs: Blood pressure 149/96, pulse 74, temperature 98.7 F (37.1 C), temperature source Oral, resp. rate 16, height 6' (1.829 m), weight 98.9 kg (218 lb 0.6 oz), SpO2 100.00%. No results found. Results for orders placed during the hospital encounter of 12/22/10 (from the past 72 hour(s))  GLUCOSE, CAPILLARY     Status: Abnormal   Collection Time   01/10/11 11:49 AM      Component Value Range Comment   Glucose-Capillary 116 (*) 70 - 99 (mg/dL)    Comment 1 Notify RN     GLUCOSE, CAPILLARY     Status: Abnormal   Collection Time   01/10/11  4:58 PM      Component Value Range Comment   Glucose-Capillary 117 (*) 70 - 99 (mg/dL)    Comment 1 Notify RN     GLUCOSE, CAPILLARY     Status: Abnormal   Collection Time   01/10/11  8:14 PM      Component Value Range Comment   Glucose-Capillary 180 (*) 70 - 99 (mg/dL)    Comment 1 Notify RN     GLUCOSE, CAPILLARY     Status: Abnormal   Collection Time   01/11/11  7:31 AM      Component Value Range Comment   Glucose-Capillary 128 (*) 70 - 99 (mg/dL)    Comment 1 Notify RN     GLUCOSE, CAPILLARY     Status: Abnormal   Collection Time   01/11/11 11:53 AM      Component Value Range Comment   Glucose-Capillary 137 (*) 70 - 99 (mg/dL)    Comment 1 Notify RN     GLUCOSE, CAPILLARY     Status: Abnormal   Collection Time   01/11/11  4:57 PM      Component Value Range Comment   Glucose-Capillary 147 (*) 70 - 99 (mg/dL)    Comment 1 Notify RN     GLUCOSE, CAPILLARY     Status:  Abnormal   Collection Time   01/11/11  8:41 PM      Component Value Range Comment   Glucose-Capillary 150 (*) 70 - 99 (mg/dL)    Comment 1 Notify RN     GLUCOSE, CAPILLARY     Status: Abnormal   Collection Time   01/12/11  7:35 AM      Component Value Range Comment   Glucose-Capillary 120 (*) 70 - 99 (mg/dL)    Comment 1 Notify RN     GLUCOSE, CAPILLARY     Status: Abnormal   Collection Time   01/12/11 11:36 AM      Component Value Range Comment   Glucose-Capillary 121 (*) 70 - 99 (mg/dL)    Comment 1 Notify RN     GLUCOSE, CAPILLARY     Status: Abnormal   Collection Time   01/12/11  4:45 PM      Component Value Range Comment   Glucose-Capillary 137 (*) 70 - 99 (mg/dL)  Comment 1 Notify RN     GLUCOSE, CAPILLARY     Status: Abnormal   Collection Time   01/12/11  9:18 PM      Component Value Range Comment   Glucose-Capillary 143 (*) 70 - 99 (mg/dL)    Comment 1 Notify RN     BASIC METABOLIC PANEL     Status: Abnormal   Collection Time   01/13/11  7:20 AM      Component Value Range Comment   Sodium 138  135 - 145 (mEq/L)    Potassium 3.1 (*) 3.5 - 5.1 (mEq/L)    Chloride 101  96 - 112 (mEq/L)    CO2 26  19 - 32 (mEq/L)    Glucose, Bld 111 (*) 70 - 99 (mg/dL)    BUN 11  6 - 23 (mg/dL)    Creatinine, Ser 8.65  0.50 - 1.35 (mg/dL)    Calcium 9.6  8.4 - 10.5 (mg/dL)    GFR calc non Af Amer >90  >90 (mL/min)    GFR calc Af Amer >90  >90 (mL/min)   GLUCOSE, CAPILLARY     Status: Abnormal   Collection Time   01/13/11  7:20 AM      Component Value Range Comment   Glucose-Capillary 110 (*) 70 - 99 (mg/dL)    Comment 1 Notify RN         .  Patient sitting comfortably in his Bed. Patient has weak cough  Trach site with granulation tissue at base, stoma is closed. He is fair oral motor control. No gross cranial nerve abnormalities I can see on exam today. Patient was able to follow simple one-step commands. He communicated with simple yes no and shakes.  He had diminished fine  motor movement in all 4 limbs. He may have had some underlying ataxia on the right  Strength was grossly3+/5 proximally in both upper limbs. Lower extremity grossly3/5 proximal to3/5 distally. Patient did have sensation to gross pain stimulation. Reflexes are 1+ grossly throughout. Patient had poor insight and and awareness. Heart was irregularly irregular. Chest was clear. Abdomen soft nontender. PEG site was clean and intact with no drainage. Skin throughout was generally intact. Trace extremity edema. All 4 limbs were warm with 2+ pulses. Right wrist no tenderness to palp or with ROM R foot swelling no erythema, mild pain with ROM  Assessment/Plan: 1. Functional deficits secondary to R cerebellar ICH severe R UE and R LE ataxia which require 3+ hours per day of interdisciplinary therapy in a comprehensive inpatient rehab setting.  Team conf today Physiatrist is providing close team supervision and 24 hour management of active medical problems listed below. Physiatrist and rehab team continue to assess barriers to discharge/monitor patient progress toward functional and medical goals. Mobility: Bed Mobility Bed Mobility: Yes Rolling Right: 2: Max assist;With rail Rolling Left: 3: Mod assist Right Sidelying to Sit: With rails;2: Max assist Sitting - Scoot to Edge of Bed: 1: +1 Total assist;Other (comment) (significant posterior right lean) Transfers Sit to Stand: 1: +1 Total assist Sit to Stand Details (indicate cue type and reason): significant posterior right lean, slow to initiate movement Stand to Sit: 1: +1 Total assist Stand Pivot Transfer Details (indicate cue type and reason): poor LE eccentric control with uncontrolled descent, significant posterior right lean Squat Pivot Transfers: 1: +2 Total assist Squat Pivot Transfer Details (indicate cue type and reason): total assist + 2 (pt = 50%), poor graded movement and weight shift, slow to initiate  with LEs Ambulation/Gait Ambulation/Gait  Assistance: 2: Max assist Stairs: No (pre-gait level at this time, unsafe to attempt) Naval architect Mobility: No  ADL:    Cognition: Cognition Overall Cognitive Status: Impaired Arousal/Alertness: Awake/alert Orientation Level: Oriented X4 Attention: Selective Sustained Attention: Impaired Sustained Attention Impairment: Functional basic Selective Attention: Impaired Selective Attention Impairment: Functional basic Memory: Impaired Memory Impairment: Decreased recall of new information;Decreased long term memory Decreased Long Term Memory: Verbal complex Awareness: Impaired Awareness Impairment: Emergent impairment Problem Solving: Impaired Problem Solving Impairment: Verbal basic;Functional basic Executive Function: Self Monitoring;Self Correcting;Initiating Initiating: Impaired Initiating Impairment: Functional basic;Verbal basic (minimal cues) Self Monitoring: Impaired Self Monitoring Impairment: Functional basic Self Correcting: Impaired Self Correcting Impairment: Verbal basic;Functional basic Safety/Judgment: Appears intact Cognition Arousal/Alertness: Awake/alert Orientation Level: Oriented X4  2. Anticoagulation/DVT prophylaxis with Pharmaceutical:no anticoag use SCDs 3. Pain Management:monitor, tylenol for non-specific right foot pain. Patient Active Hospital Problem List: 4.  Afib cannot use warfarin due to ICH resume baby ASA Diabetes mellitus (11/19/2010)    POA: Yes 5.  LUE edema improving 6.   DM Lantus monitor CBG--good control at present. No change in regimen. 7. Dysphagia nectar liquids plus H2O protocol    8.Gout (11/20/2010) -may be behind some of foot pain from yesterday.  Foot not tender on exam. Urate level elevated pt  on allopurinol.  Right wrist feeling better today.  Continue colchicine bid thru tomorrow               9..  Hypo K monitor no diuretics. Recheck cont mild low increase KCL      10.  HTN  controlled 22  Sanika Brosious E 01/13/2011, 9:13 AM

## 2011-01-14 MED ORDER — CALCIUM POLYCARBOPHIL 625 MG PO TABS
625.0000 mg | ORAL_TABLET | Freq: Every day | ORAL | Status: DC
  Administered 2011-01-14 – 2011-01-19 (×5): 625 mg via ORAL
  Filled 2011-01-14 (×8): qty 1

## 2011-01-14 NOTE — Progress Notes (Signed)
Physical Therapy Note  Patient Details  Name: [name] MRN: [id number] Date of Birth: [birth year] Today's Date: 01/14/2011  15:15- 16:00 individual therapy pt denied pain gait training with EVA walker max assist to control walker and posterior balance. Max vc needed for rt. Foot placement to keep from adducting.performed activity focusing on rt. Foot placement in correct placement. Attempted gait with rw with pt's family member Shon Hale assisting.pt required max assist to 2 person assist with assist to hold walker to the floor and for balance. Discussed the fact that pt. Would likely not be ambulating with family at home.   [name], [name] 01/14/2011, 4:18 PM

## 2011-01-14 NOTE — Progress Notes (Signed)
Occupational Therapy Weekly Progress Note  Patient Details  Name: [name] MRN: [id number] Date of Birth: May 02, 1955  Today's Date: 01/14/2011 Time: 1330-1405 Time Calculation (min): 35 min  Patient has met 5 of 10 long term goals.  Short term goals not set due to remaining length of stay.  Pt at an overall min assist with bed mobility and transfers to/from w/c and bed, min assist bathing, setup UB dressing, min assist LB dressing, fluctuating mod-min assist toilet and shower transfer.  Pt demonstrating improved sitting balance, improving standing balance with min assist to remain standing and min verbal cues for weight shift to decrease posterior lean.  Pt's brother has been trained in transfers bed>w/c>bed and is safe and independent with those.  Continued education required for bathing and dressing, and toilet transfers and tub/shower transfers.  Patient continues to demonstrate the following deficits: decreased activity tolerance, decreased standing balance, decreased transfers and therefore will continue to benefit from skilled OT intervention to enhance overall performance with ADL.  See Patient's Care Plan for progression toward long term goals.  Patient progressing toward long term goals..  Continue plan of care.  Therapy Documentation Precautions: Precautions Precautions: Fall Precaution Comments: monitor O2 during activity Required Braces or Orthoses: No Restrictions Weight Bearing Restrictions: No Other Position/Activity Restrictions: posterior lean  Skilled Therapeutic Interventions/Progress Updates:    Engaged in neuro musc re-ed in sitting and standing with address posterior lean in standing with functional tasks.  Completed BUE fine motor task with noted dysmetria and ataxia with RUE >LUE, encouraged pt to use vision to direct movements with slight improvement.  Added approx 1lb cuff weight to RUE with noted improvement with movements.  Pt with increased standing balance  this session with sit to stand with close supervision and min assist to maintain standing, decreased posterior lean noted during this session.  General General Chart Reviewed: Yes Vital Signs   Pain Pain Assessment Pain Assessment: No/denies pain Pain Score: 0-No pain ADL ADL Eating: Supervision/safety Grooming: Supervision/safety Where Assessed-Grooming: Sitting at sink Upper Body Bathing: Supervision/safety Where Assessed-Upper Body Bathing: Shower Lower Body Bathing: Minimal assistance Where Assessed-Lower Body Bathing: Shower Upper Body Dressing: Setup Where Assessed-Upper Body Dressing: Sitting at sink Lower Body Dressing: Minimal assistance Where Assessed-Lower Body Dressing: Standing at sink;Sitting at sink Toileting: Unable to assess (incontinent prior to session) Toilet Transfer: Minimal assistance Toilet Transfer Method: Squat pivot Toilet Transfer Equipment: Bedside commode;Other (comment) (over toilet) Tub/Shower Transfer: Unable to assess Film/video editor: Minimal assistance Film/video editor Method: Administrator: Shower seat with back   Therapy/Group: Individual Therapy  Leonette Monarch 01/14/2011, 3:13 PM

## 2011-01-14 NOTE — Progress Notes (Signed)
Nutrition Follow-up  Pt continues to eat well, eating 100% of meals.  Diet Order:  Dysphagia 3, Nectar Liquids  Meds: Scheduled Meds:   . allopurinol  100 mg Oral Daily  . aspirin  81 mg Oral Daily  . baclofen  5 mg Oral BID  . carvedilol  25 mg Oral BID WC  . colchicine  0.6 mg Oral BID  . diltiazem  90 mg Oral Q6H  . doxazosin  1 mg Oral QHS  . free water  100 mL Per Tube Q8H  . insulin aspart  0-15 Units Subcutaneous TID WC  . insulin aspart  0-5 Units Subcutaneous QHS  . insulin glargine  15 Units Subcutaneous BID  . isosorbide-hydrALAZINE  2 tablet Oral TID  . pantoprazole  40 mg Oral Q1200  . potassium chloride  30 mEq Oral BID  . ramipril  10 mg Oral BID  . rosuvastatin  5 mg Oral q1800  . simethicone  80 mg Oral QID   Continuous Infusions:  PRN Meds:.acetaminophen, ALPRAZolam, alum & mag hydroxide-simeth, bisacodyl, diphenhydrAMINE, food thickener, guaiFENesin-dextromethorphan, polyethylene glycol, promethazine, promethazine, promethazine, silver nitrate applicators, sodium chloride, traZODone  Labs:  CMP     Component Value Date/Time   NA 138 01/13/2011 0720   K 3.1* 01/13/2011 0720   CL 101 01/13/2011 0720   CO2 26 01/13/2011 0720   GLUCOSE 111* 01/13/2011 0720   BUN 11 01/13/2011 0720   CREATININE 0.92 01/13/2011 0720   CALCIUM 9.6 01/13/2011 0720   PROT 7.1 12/23/2010 0420   ALBUMIN 2.4* 12/23/2010 0420   AST 20 12/23/2010 0420   ALT 23 12/23/2010 0420   ALKPHOS 92 12/23/2010 0420   BILITOT 0.2* 12/23/2010 0420   GFRNONAA >90 01/13/2011 0720   GFRAA >90 01/13/2011 0720     Intake/Output Summary (Last 24 hours) at 01/14/11 1027 Last data filed at 01/14/11 0900  Gross per 24 hour  Intake    800 ml  Output   1225 ml  Net   -425 ml    Weight Status:  98.1 kg, wt stable  Nutrition Dx:  Swallowing difficulty - persists  Goal:  PO's to meet >/= 90% estimated needs - met.  Intervention:   1. Continue current interventions, pt continues to eat well. 2. RD to follow  nutrition care plan.  Monitor:  Weight, labs, PO intake, I/O's  Adair Laundry Pager #:  [phone]

## 2011-01-14 NOTE — Progress Notes (Signed)
Progress Notes  Speech Language Pathology Therapy Note  Patient Details  Name: [name] MRN: [id number] Date of Birth: 02-08-1955  Today's Date: 01/14/2011 Time: 6213-0865 Time Calculation (min): 45 min  Precautions: Precautions Precautions: Fall Precaution Comments: monitor O2 during activity Required Braces or Orthoses: No Restrictions Weight Bearing Restrictions: No Other Position/Activity Restrictions: posterior lean  Short Term Goals: set 01/12/11  1. Patient will consume dysphagia 3 (soft solids) and nectar-thick liquids showing no overt s/s of aspiration with the use of a chin tuck and other compensatory strategies with supervision assist  2. Patient will demonstrate day to day carryover of self care information with minimal assist semantic cues  3. Patient will demonstrate selective attention to task for 30 minutes with supervision assist semantic cues  4. Patient will initiate verbal expression with requests for help or for communicating information with minimal assist semantic cues  5. Patient will self monitor and correct verbal expression with supervision assist semantic cues  Skilled Therapeutic Interventions/Progress Updates: Session focused on problem solving.  SLP facilitated session with minimal assist semantic for working memory and problem solving with money management task.  Patient unable to use external aids and written aids due to visual deficits (baseline) and no glasses.  However, patient able to recall date, discharge date and information that doctor gave this morning regarding removal of peg tube, with cues to recall information regarding repeat MBSS.  Overall, with repetition patient appears to demonstrate improved recall.      Pain Pain Assessment Pain Assessment: No/denies pain Pain Score: 0-No pain  Oral/Motor: Oral Motor/Sensory Function Labial ROM: Reduced left Labial Symmetry: Abnormal symmetry left;Abnormal symmetry right Labial Strength:  Reduced Lingual ROM: Reduced right;Reduced left Lingual Strength: Reduced Motor Speech Articulation: Impaired (supervision cues) Intelligibility: Intelligibility reduced (supervision cues) Comprehension: Auditory Comprehension Yes/No Questions: Impaired Basic Immediate Environment Questions: 75-100% accurate Complex Questions: 75-100% accurate Commands: Impaired Multistep Basic Commands: 75-100% accurate Conversation: Simple Interfering Components: Anxiety;Processing speed (trach with PMSV) EffectiveTechniques: Extra processing time;Repetition Visual Recognition/Discrimination Discrimination: Exceptions to WFL (no glasses or contacts) Reading Comprehension Reading Status: Impaired Interfering Components: Attention;Visual scanning;Processing time;Visual acuity;Eye glasses not available Effective Techniques: Large print;Other (comment) (simple print) Expression: Expression Primary Mode of Expression: Verbal Verbal Expression Overall Verbal Expression: Impaired Initiation: Impaired (minimal cues) Automatic Speech: Day of week;Month of year (DOW 6/7, MOY perseverated on DOW) Level of Generative/Spontaneous Verbalization: Word Repetition: No impairment Naming: Not tested Pragmatics: Impairment Impairments: Monotone;Eye contact Interfering Components: Attention Effective Techniques: Open ended questions;Semantic cues Non-Verbal Means of Communication: Not applicable Written Expression Written Expression: Exceptions to [facility] Self Formulation Ability: Word (moderate cues for organization)  Therapy/Group: Individual Therapy  Charlane Ferretti., CCC-SLP [phone] Muaad Boehning 01/14/2011 11:23 AM

## 2011-01-14 NOTE — Patient Care Conference (Signed)
Inpatient RehabilitationTeam Conference Note Date: 01/14/11   Time: 10:40 AM    Patient Name: [name]      Medical Record Number: [id number]  Date of Birth: 1955-06-10 Sex: Male         Room/Bed: 4030/4030-01 Payor Info: Payor: Advertising copywriter  Plan: [employer]  Product Type: *No Product type*     Admitting Diagnosis: L CEREBELLAR CVA  Admit Date/Time:  12/22/2010  4:53 PM Admission Comments: No comment available   Primary Diagnosis:  <principal problem not specified> Principal Problem: <principal problem not specified>  Patient Active Problem List  Diagnoses Date Noted  . ICH (intracerebral hemorrhage) 12/22/2010  . Hypernatremia 12/22/2010  . Physical deconditioning 12/22/2010  . Hyperlipemia 11/21/2010  . Gout 11/20/2010  . CAD (coronary artery disease) 11/20/2010  . Stroke 11/19/2010  . Diabetes mellitus 11/19/2010  . Respiratory failure 11/19/2010  . A-fib 11/19/2010    Expected Discharge Date: Expected Discharge Date: 01/19/11  Team Members Present: Physician: Dr. Claudette Laws Case Manager Present: Lutricia Horsfall, RN Social Worker Present: Dossie Der, LCSW PT Present: Edman Circle, PT;Becky Henrene Dodge, PT OT Present: Leonette Monarch, OT SLP Present: Fae Pippin, SLP Nurse: Laural Roes, RN     Current Status/Progress Goal Weekly Team Focus  Medical   Incont of bowel  Improve consistency  bulk up stool   Bowel/Bladder   incontinent of bowel and bladder, lbm 1/2  continent bowel/bladder  timed toileting   Swallow/Nutrition/ Hydration   Dys 3 nectar-thick liquids with chin tuck via sraw with minimal assist cues, water protocol  least restrictive p.o. intake with supervision  repeat mbss 12/16/10; education   ADL's   setup/cues for UB bathing and dressing, min A/steady assist LB bathing and dressing, min assist transfers, min A/steady assist toileting  min assist overall with ADL  improve standing balalnce, hands on family education     Mobility   Min to mod assist with transfers; max assist with standing and pre-gait activities  min assist transfers, supervision W/C mobility, mod assist gait with therapies   family education to prepare for discharge next week   Communication   supervision-mod I  mod I  cont. increase increase initiation and vocal intensity   Safety/Cognition/ Behavioral Observations  minimal assist  supervision  increase working memory, initiation   Pain   no complaints of pain  < 3  monitor for pain   Skin   dry dressing to trach site, C.D.I. PEG site C.D.I.  no new breakdown  skin care daily, monitor for breakdown, dressing changes to trach site and peg site prn      *See Interdisciplinary Assessment and Plan and progress notes for long and short-term goals  Barriers to Discharge: bowel inc    Possible Resolutions to Barriers:  see above    Discharge Planning/Teaching Needs:  Home with brother and siblings to provide physical assist. Brother here participating in therapies with pt      Team Discussion:  Pt NPO tonight removal of PEG tomorrow. Good PO intake. Pt continues w/ incontinence of B&B at times, mostly due to urgency. Stools continue to be loose;plan to add fiber. Pt's brother has been participating in therapy.  Revisions to Treatment Plan:  none   Continued Need for Acute Rehabilitation Level of Care: The patient requires daily medical management by a physician with specialized training in physical medicine and rehabilitation for the following conditions: Daily direction of a multidisciplinary physical rehabilitation program to ensure safe treatment while eliciting the highest  outcome that is of practical value to the patient.: Yes Daily medical management of patient stability for increased activity during participation in an intensive rehabilitation regime.: Yes Daily analysis of laboratory values and/or radiology reports with any subsequent need for medication adjustment of medical  intervention for : Neurological problems  [name] 01/15/2011, 9:42 AM

## 2011-01-14 NOTE — Progress Notes (Signed)
Speech Pathology: Dysphagia Treatment Note  Individual session  1130-1150  Patient was observed with : Mechanical Soft and Nectar liquids via straw.  Patient was noted to have s/s of aspiration : No  Lung Sounds:  WNL Temperature: WNL  Patient required: supervision cues to consistently follow precautions/strategies  Clinical Impression: SLP facilitated supervision semantic cues to perform full chin tuck with nectar-thick liquids via straw  Recommendations:  Continue with current plan of care  Pain:   none Intervention Required:   No  Goals: Progressing  Fae Pippin, M.A., CCC-SLP [phone]

## 2011-01-14 NOTE — Progress Notes (Signed)
Patient alert and oriented x3. Transfers 2+ mod assist squat pivot. Uses BSC over BR toilet. Large loose BM this am during therapy with BSC. Patient has difficulty knowing when he needs toileting. Incontinent of bowel and bladder. Trach site C.D.I. And dry dressing changed. PEG site C.D.I. PEG flushed. Dry dressing removed, no drainage. To be NPO after midnight for PEG removal. Full supervision with meals, D3 nectar thick, brother supervises. No complaints of pain at this time. Will continue with plan of care.

## 2011-01-14 NOTE — Progress Notes (Signed)
Physical Therapy Weekly Progress Note  Patient Details  Name: LEAF KERNODLE MRN: [id number] Date of Birth: 03/23/1955  Today's Date: 01/14/2011  Patient continues to make excellent progress with PT this week, from mod assist transfers, min assist W/C mobility < 100 feet, and total assist for standing pre-gait and gait activities to current min assist level for transfers, supervision W/C mobility > 100 feet, and max assist gait with Carley Hammed walker and total assist +2 gait with RW. As a result of improved activity tolerance, standing tolerance, emergent awareness, and postural control, patient has met 4 of 5 short term goals:  PT Short Term Goals PT Short Term Goal 1: Patient will perform bed mobility and supine-sit with supervision. PT Short Term Goal 1 - Progress: Met PT Short Term Goal 2: Patient will transfer with min assist 3 of 5 trials. PT Short Term Goal 2 - Progress: Met PT Short Term Goal 3: Patient will maintain dynamic standing balance with min assist > 5 min for ADL task. PT Short Term Goal 3 - Progress: Progressing toward goal PT Short Term Goal 4: Patient will gait 15 feet with max assist. PT Short Term Goal 4 - Progress: Met PT Short Term Goal 5: Patient will propel W/C > 100 feet with min assist. PT Short Term Goal 5 - Progress: Met  Standing balance goal was not met due to continued posterior lean with weight over bilat heels. Patient is more emergently aware of body position in space and posterior lean, however hip and ankle strategies are delayed and patient is unable to initiate self correction without skilled assistance at this time.  Patient will continue to benefit from skilled PT intervention to enhance overall performance with activity tolerance, balance, postural control, ability to compensate for deficits, awareness and coordination and complete family education for safe discharge home at anticipated min assist W/C level.  Patient progressing toward long term goals..   Continue plan of care.   [name] 01/14/2011, 4:42 PM

## 2011-01-14 NOTE — Progress Notes (Signed)
Occupational Therapy Session Note  Patient Details  Name: [name] MRN: [id number] Date of Birth: 07-Jan-1956  Today's Date: 01/14/2011 Time: 1030-1130 Time Calculation (min): 60 min  Precautions: Precautions Precautions: Fall Precaution Comments: monitor O2 during activity Required Braces or Orthoses: No Restrictions Weight Bearing Restrictions: No Other Position/Activity Restrictions: posterior lean  Short Term Goals: OT Short Term Goal 1: Pt will complete bathing with mod assist in seated position OT Short Term Goal 1 - Progress: Met OT Short Term Goal 2: Pt will complete UB dressing with min assist  OT Short Term Goal 2 - Progress: Met OT Short Term Goal 3: Pt will complete LB dressing with max assist in sit to stand position OT Short Term Goal 3 - Progress: Met OT Short Term Goal 4: Pt will demonstrate improved unsupported sitting balance for 10 mins to complete UB bathing and dressing OT Short Term Goal 4 - Progress: Met  Skilled Therapeutic Interventions/Progress Updates: self care retraining at shower level in room; focused on bed mobility, squat pivot transfers bed>w/c>3:1 in shower, sit to stand, standing balance with awareness of posterior lean and self correction with min to mod A with tactile cues, activity tolerance, working memory, simple problem solving; grooming performed at sink sitting with only verbal cues for safety and to lean forward to spit. Administered/ supervised water protocol. Pt did well this morning with transition to shower.     Pain Pain Assessment Pain Assessment: No/denies pain Pain Score: 0-No pain  Therapy/Group: Individual Therapy  Melonie Florida 01/14/2011, 12:17 PM

## 2011-01-14 NOTE — Progress Notes (Signed)
Patient ID: [name], male   DOB: 08-27-1955, 56 y.o.   MRN: [id number] Subjective/Complaints  Eating and drinking well Review of Systems  HENT: Negative for congestion.        Trach removed  Respiratory: Negative.   Gastrointestinal: Negative for nausea, abdominal pain and diarrhea.       PEG  Genitourinary: Negative for frequency and hematuria.       Condom cath  Musculoskeletal: Negative for joint pain.  All other systems reviewed and are negative.    Objective: Vital Signs: Blood pressure 151/97, pulse 71, temperature 97.8 F (36.6 C), temperature source Oral, resp. rate 18, height 6' (1.829 m), weight 98.1 kg (216 lb 4.3 oz), SpO2 99.00%. No results found. Results for orders placed during the hospital encounter of 12/22/10 (from the past 72 hour(s))  GLUCOSE, CAPILLARY     Status: Abnormal   Collection Time   01/11/11 11:53 AM      Component Value Range Comment   Glucose-Capillary 137 (*) 70 - 99 (mg/dL)    Comment 1 Notify RN     GLUCOSE, CAPILLARY     Status: Abnormal   Collection Time   01/11/11  4:57 PM      Component Value Range Comment   Glucose-Capillary 147 (*) 70 - 99 (mg/dL)    Comment 1 Notify RN     GLUCOSE, CAPILLARY     Status: Abnormal   Collection Time   01/11/11  8:41 PM      Component Value Range Comment   Glucose-Capillary 150 (*) 70 - 99 (mg/dL)    Comment 1 Notify RN     GLUCOSE, CAPILLARY     Status: Abnormal   Collection Time   01/12/11  7:35 AM      Component Value Range Comment   Glucose-Capillary 120 (*) 70 - 99 (mg/dL)    Comment 1 Notify RN     GLUCOSE, CAPILLARY     Status: Abnormal   Collection Time   01/12/11 11:36 AM      Component Value Range Comment   Glucose-Capillary 121 (*) 70 - 99 (mg/dL)    Comment 1 Notify RN     GLUCOSE, CAPILLARY     Status: Abnormal   Collection Time   01/12/11  4:45 PM      Component Value Range Comment   Glucose-Capillary 137 (*) 70 - 99 (mg/dL)    Comment 1 Notify RN     GLUCOSE, CAPILLARY      Status: Abnormal   Collection Time   01/12/11  9:18 PM      Component Value Range Comment   Glucose-Capillary 143 (*) 70 - 99 (mg/dL)    Comment 1 Notify RN     BASIC METABOLIC PANEL     Status: Abnormal   Collection Time   01/13/11  7:20 AM      Component Value Range Comment   Sodium 138  135 - 145 (mEq/L)    Potassium 3.1 (*) 3.5 - 5.1 (mEq/L)    Chloride 101  96 - 112 (mEq/L)    CO2 26  19 - 32 (mEq/L)    Glucose, Bld 111 (*) 70 - 99 (mg/dL)    BUN 11  6 - 23 (mg/dL)    Creatinine, Ser 7.82  0.50 - 1.35 (mg/dL)    Calcium 9.6  8.4 - 10.5 (mg/dL)    GFR calc non Af Amer >90  >90 (mL/min)    GFR calc Af Amer >90  >  90 (mL/min)   GLUCOSE, CAPILLARY     Status: Abnormal   Collection Time   01/13/11  7:20 AM      Component Value Range Comment   Glucose-Capillary 110 (*) 70 - 99 (mg/dL)    Comment 1 Notify RN     GLUCOSE, CAPILLARY     Status: Abnormal   Collection Time   01/13/11 11:53 AM      Component Value Range Comment   Glucose-Capillary 138 (*) 70 - 99 (mg/dL)    Comment 1 Notify RN     GLUCOSE, CAPILLARY     Status: Abnormal   Collection Time   01/13/11  4:17 PM      Component Value Range Comment   Glucose-Capillary 125 (*) 70 - 99 (mg/dL)   GLUCOSE, CAPILLARY     Status: Abnormal   Collection Time   01/13/11  9:13 PM      Component Value Range Comment   Glucose-Capillary 145 (*) 70 - 99 (mg/dL)   GLUCOSE, CAPILLARY     Status: Abnormal   Collection Time   01/14/11  7:13 AM      Component Value Range Comment   Glucose-Capillary 136 (*) 70 - 99 (mg/dL)    Comment 1 Notify RN         .  Patient sitting comfortably in his Bed. Patient has weak cough  Trach site with granulation tissue at base, stoma is closed. He is fair oral motor control. No gross cranial nerve abnormalities I can see on exam today. Patient was able to follow simple one-step commands. He communicated with simple yes no and shakes.  He had diminished fine motor movement in all 4 limbs. He may have had  some underlying ataxia on the right  Strength was grossly3+/5 proximally in both upper limbs. Lower extremity grossly3/5 proximal to3/5 distally. Patient did have sensation to gross pain stimulation. Reflexes are 1+ grossly throughout. Patient had poor insight and and awareness. Heart was irregularly irregular. Chest was clear. Abdomen soft nontender. PEG site was clean and intact with no drainage. Skin throughout was generally intact. Trace extremity edema. All 4 limbs were warm with 2+ pulses. Right wrist no tenderness to palp or with ROM R foot swelling no erythema, mild pain with ROM  Assessment/Plan: 1. Functional deficits secondary to R cerebellar ICH severe R UE and R LE ataxia which require 3+ hours per day of interdisciplinary therapy in a comprehensive inpatient rehab setting.  Team conf today Physiatrist is providing close team supervision and 24 hour management of active medical problems listed below. Physiatrist and rehab team continue to assess barriers to discharge/monitor patient progress toward functional and medical goals. Mobility: Bed Mobility Bed Mobility: Yes Rolling Right: 2: Max assist;With rail Rolling Left: 3: Mod assist Right Sidelying to Sit: With rails;2: Max assist Sitting - Scoot to Edge of Bed: 1: +1 Total assist;Other (comment) (significant posterior right lean) Transfers Sit to Stand: 1: +1 Total assist Sit to Stand Details (indicate cue type and reason): significant posterior right lean, slow to initiate movement Stand to Sit: 1: +1 Total assist Stand Pivot Transfer Details (indicate cue type and reason): poor LE eccentric control with uncontrolled descent, significant posterior right lean Squat Pivot Transfers: 1: +2 Total assist Squat Pivot Transfer Details (indicate cue type and reason): total assist + 2 (pt = 50%), poor graded movement and weight shift, slow to initiate with LEs Ambulation/Gait Ambulation/Gait Assistance: 2: Max assist Stairs: No  (pre-gait level at this time,  unsafe to attempt) Naval architect Mobility: No  ADL:    Cognition: Cognition Overall Cognitive Status: Impaired Arousal/Alertness: Awake/alert Orientation Level: Oriented X4 Attention: Selective Sustained Attention: Impaired Sustained Attention Impairment: Functional basic Selective Attention: Impaired Selective Attention Impairment: Functional basic Memory: Impaired Memory Impairment: Decreased recall of new information;Decreased long term memory Decreased Long Term Memory: Verbal complex Awareness: Impaired Awareness Impairment: Emergent impairment Problem Solving: Impaired Problem Solving Impairment: Verbal basic;Functional basic Executive Function: Self Monitoring;Self Correcting;Initiating Initiating: Impaired Initiating Impairment: Functional basic;Verbal basic (minimal cues) Self Monitoring: Impaired Self Monitoring Impairment: Functional basic Self Correcting: Impaired Self Correcting Impairment: Verbal basic;Functional basic Safety/Judgment: Appears intact Cognition Arousal/Alertness: Awake/alert Orientation Level: Oriented X4  2. Anticoagulation/DVT prophylaxis with Pharmaceutical:no anticoag use SCDs 3. Pain Management:monitor, tylenol for non-specific right foot pain. Patient Active Hospital Problem List: 4.  Afib cannot use warfarin due to ICH resume baby ASA Diabetes mellitus (11/19/2010)    POA: Yes 5.  LUE edema improving 6.   DM Lantus monitor CBG--good control at present. No change in regimen. 7. Dysphagia nectar liquids plus H2O protocol plan D/C PEG in am    8.Gout (11/20/2010) -may be behind some of foot pain from yesterday.  Foot not tender on exam. Urate level elevated pt  on allopurinol.  Right wrist feeling better today.  Continue colchicine bid thru tomorrow               9..  Hypo K monitor no diuretics. Recheck cont mild low increase KCL      10.  HTN controlled 23  Abanoub Hanken E 01/14/2011,  8:02 AM

## 2011-01-15 LAB — GLUCOSE, CAPILLARY
Glucose-Capillary: 101 mg/dL — ABNORMAL HIGH (ref 70–99)
Glucose-Capillary: 103 mg/dL — ABNORMAL HIGH (ref 70–99)
Glucose-Capillary: 187 mg/dL — ABNORMAL HIGH (ref 70–99)

## 2011-01-15 NOTE — Progress Notes (Signed)
Patient ID: [name], male   DOB: [birth year], 56 y.o.   MRN: [id number] Subjective/Complaints  Eating and drinking well Review of Systems  HENT: Negative for congestion.        Trach removed  Respiratory: Negative.   Gastrointestinal: Negative for nausea, abdominal pain and diarrhea.       PEG  Genitourinary: Negative for frequency and hematuria.       Condom cath  Musculoskeletal: Negative for joint pain.  All other systems reviewed and are negative.    Objective: Vital Signs: Blood pressure 148/98, pulse 76, temperature 97.4 F (36.3 C), temperature source Oral, resp. rate 19, height 6' (1.829 m), weight 98 kg (216 lb 0.8 oz), SpO2 100.00%. No results found. Results for orders placed during the hospital encounter of 12/22/10 (from the past 72 hour(s))  GLUCOSE, CAPILLARY     Status: Abnormal   Collection Time   01/12/11 11:36 AM      Component Value Range Comment   Glucose-Capillary 121 (*) 70 - 99 (mg/dL)    Comment 1 Notify RN     GLUCOSE, CAPILLARY     Status: Abnormal   Collection Time   01/12/11  4:45 PM      Component Value Range Comment   Glucose-Capillary 137 (*) 70 - 99 (mg/dL)    Comment 1 Notify RN     GLUCOSE, CAPILLARY     Status: Abnormal   Collection Time   01/12/11  9:18 PM      Component Value Range Comment   Glucose-Capillary 143 (*) 70 - 99 (mg/dL)    Comment 1 Notify RN     BASIC METABOLIC PANEL     Status: Abnormal   Collection Time   01/13/11  7:20 AM      Component Value Range Comment   Sodium 138  135 - 145 (mEq/L)    Potassium 3.1 (*) 3.5 - 5.1 (mEq/L)    Chloride 101  96 - 112 (mEq/L)    CO2 26  19 - 32 (mEq/L)    Glucose, Bld 111 (*) 70 - 99 (mg/dL)    BUN 11  6 - 23 (mg/dL)    Creatinine, Ser 1.19  0.50 - 1.35 (mg/dL)    Calcium 9.6  8.4 - 10.5 (mg/dL)    GFR calc non Af Amer >90  >90 (mL/min)    GFR calc Af Amer >90  >90 (mL/min)   GLUCOSE, CAPILLARY     Status: Abnormal   Collection Time   01/13/11  7:20 AM      Component Value  Range Comment   Glucose-Capillary 110 (*) 70 - 99 (mg/dL)    Comment 1 Notify RN     GLUCOSE, CAPILLARY     Status: Abnormal   Collection Time   01/13/11 11:53 AM      Component Value Range Comment   Glucose-Capillary 138 (*) 70 - 99 (mg/dL)    Comment 1 Notify RN     GLUCOSE, CAPILLARY     Status: Abnormal   Collection Time   01/13/11  4:17 PM      Component Value Range Comment   Glucose-Capillary 125 (*) 70 - 99 (mg/dL)   GLUCOSE, CAPILLARY     Status: Abnormal   Collection Time   01/13/11  9:13 PM      Component Value Range Comment   Glucose-Capillary 145 (*) 70 - 99 (mg/dL)   GLUCOSE, CAPILLARY     Status: Abnormal   Collection Time  01/14/11  7:13 AM      Component Value Range Comment   Glucose-Capillary 136 (*) 70 - 99 (mg/dL)    Comment 1 Notify RN     GLUCOSE, CAPILLARY     Status: Abnormal   Collection Time   01/14/11 11:31 AM      Component Value Range Comment   Glucose-Capillary 109 (*) 70 - 99 (mg/dL)    Comment 1 Notify RN     GLUCOSE, CAPILLARY     Status: Abnormal   Collection Time   01/14/11  4:15 PM      Component Value Range Comment   Glucose-Capillary 101 (*) 70 - 99 (mg/dL)   GLUCOSE, CAPILLARY     Status: Abnormal   Collection Time   01/14/11  8:21 PM      Component Value Range Comment   Glucose-Capillary 162 (*) 70 - 99 (mg/dL)   GLUCOSE, CAPILLARY     Status: Abnormal   Collection Time   01/15/11  7:11 AM      Component Value Range Comment   Glucose-Capillary 112 (*) 70 - 99 (mg/dL)    Comment 1 Notify RN         .  Patient sitting comfortably in his Bed. Patient has weak cough  Trach site with granulation tissue at base, stoma is closed. He is fair oral motor control. No gross cranial nerve abnormalities I can see on exam today. Patient was able to follow simple one-step commands. He communicated with simple yes no and shakes.  He had diminished fine motor movement in all 4 limbs. He may have had some underlying ataxia on the right  Strength was grossly3+/5  proximally in both upper limbs. Lower extremity grossly3/5 proximal to3/5 distally. Patient did have sensation to gross pain stimulation. Reflexes are 1+ grossly throughout. Patient had poor insight and and awareness. Heart was irregularly irregular. Chest was clear. Abdomen soft nontender. PEG site was clean and intact with no drainage. Skin throughout was generally intact. Trace extremity edema. All 4 limbs were warm with 2+ pulses. Right wrist no tenderness to palp or with ROM R foot swelling no erythema, mild pain with ROM  Assessment/Plan: 1. Functional deficits secondary to R cerebellar ICH severe R UE and R LE ataxia which require 3+ hours per day of interdisciplinary therapy in a comprehensive inpatient rehab setting.  Team conf today Physiatrist is providing close team supervision and 24 hour management of active medical problems listed below. Physiatrist and rehab team continue to assess barriers to discharge/monitor patient progress toward functional and medical goals. Mobility: Bed Mobility Bed Mobility: Yes Rolling Right: 2: Max assist;With rail Rolling Left: 3: Mod assist Right Sidelying to Sit: With rails;2: Max assist Sitting - Scoot to Edge of Bed: 1: +1 Total assist;Other (comment) (significant posterior right lean) Transfers Sit to Stand: 1: +1 Total assist Sit to Stand Details (indicate cue type and reason): significant posterior right lean, slow to initiate movement Stand to Sit: 1: +1 Total assist Stand Pivot Transfer Details (indicate cue type and reason): poor LE eccentric control with uncontrolled descent, significant posterior right lean Squat Pivot Transfers: 1: +2 Total assist Squat Pivot Transfer Details (indicate cue type and reason): total assist + 2 (pt = 50%), poor graded movement and weight shift, slow to initiate with LEs Ambulation/Gait Ambulation/Gait Assistance: 2: Max assist Stairs: No (pre-gait level at this time, unsafe to attempt) Radiation protection practitioner Mobility: No  ADL:    Cognition: Cognition Overall Cognitive Status:  Impaired Arousal/Alertness: Awake/alert Orientation Level: Oriented X4 Attention: Selective Sustained Attention: Impaired Sustained Attention Impairment: Functional basic Selective Attention: Impaired Selective Attention Impairment: Functional basic Memory: Impaired Memory Impairment: Decreased recall of new information;Decreased long term memory Decreased Long Term Memory: Verbal complex Awareness: Impaired Awareness Impairment: Emergent impairment Problem Solving: Impaired Problem Solving Impairment: Verbal basic;Functional basic Executive Function: Self Monitoring;Self Correcting;Initiating Initiating: Impaired Initiating Impairment: Functional basic;Verbal basic (minimal cues) Self Monitoring: Impaired Self Monitoring Impairment: Functional basic Self Correcting: Impaired Self Correcting Impairment: Verbal basic;Functional basic Safety/Judgment: Appears intact Cognition Arousal/Alertness: Awake/alert Orientation Level: Oriented X4  2. Anticoagulation/DVT prophylaxis with Pharmaceutical:no anticoag use SCDs 3. Pain Management:monitor, tylenol for non-specific right foot pain. Patient Active Hospital Problem List: 4.  Afib cannot use warfarin due to ICH resume baby ASA Diabetes mellitus (11/19/2010)    POA: Yes 5.  LUE edema improving 6.   DM Lantus monitor CBG--good control at present. No change in regimen. 7. Dysphagia nectar liquids plus H2O protocol plan D/C PEG in am    8.Gout (11/20/2010) -may be behind some of foot pain from yesterday.  Foot not tender on exam. Urate level elevated pt  on allopurinol.  Right wrist feeling better today.  Continue colchicine bid thru tomorrow               9..  Hypo K monitor no diuretics. Recheck cont mild low increase KCL      10.  HTN controlled 24  PEG tube removed today without difficulty 4x4 dressing applied.  May resume meals at lunch  today.  Claudette Laws E 01/15/2011, 7:52 AM

## 2011-01-15 NOTE — Progress Notes (Signed)
Social Work Patient ID: [name], male   DOB: 06-27-1955, 56 y.o.   MRN: [id number]  Met with pt and brother-[name] who is here to go through therapies and be trained to take pt home. Discussed Home Health follow up and DME. Agreeable to needed equipment.  Discussed MD to follow Molly Maduro to talk with his sister about a MD to follow.  Will work on getting primary MD for pt at discharge. He may follow up with MD here until can get MD closer to [facility]. Brother to go to pt's home and get  MD followed him here in [place].

## 2011-01-15 NOTE — Progress Notes (Signed)
Occupational Therapy Session Note  Patient Details  Name: [name] MRN: [id number] Date of Birth: 06/17/1955  Today's Date: 01/15/2011 Time: 2130-8657 Time Calculation (min): 54 min  Precautions: Precautions Precautions: Fall Precaution Comments: monitor O2 during activity Required Braces or Orthoses: No Restrictions Weight Bearing Restrictions: No Other Position/Activity Restrictions: posterior lean  Short Term Goals: OT Short Term Goal 1: Pt will complete bathing with mod assist in seated position OT Short Term Goal 1 - Progress: Met OT Short Term Goal 2: Pt will complete UB dressing with min assist  OT Short Term Goal 2 - Progress: Met OT Short Term Goal 3: Pt will complete LB dressing with max assist in sit to stand position OT Short Term Goal 3 - Progress: Met OT Short Term Goal 4: Pt will demonstrate improved unsupported sitting balance for 10 mins to complete UB bathing and dressing OT Short Term Goal 4 - Progress: Met  Skilled Therapeutic Interventions/Progress Updates:   Pt seen for ADL retraining at sink secondary to feeding tube having just been removed and concern over dressings.  Pt completed bed to w/c transfer with min assist from brother.  Bathing performed in sit to stand manner with min assist for standing balance and min verbal cues for weight shifting to decrease posterior lean.  Pt with increased LB dressing due to use of pull-up instead of fasten/diaper type brief.  Engaged in transfer training with brother in ADL apt with transfers into tub/shower with use of squat pivot to tub bench and squat pivot transfers to/from drop arm commode over toilet.  Pt and brother safe with these transfers.  Pain  Pt with no c/o pain this session ADL See FIM for details  Therapy/Group: Individual Therapy  Leonette Monarch 01/15/2011, 10:47 AM

## 2011-01-15 NOTE — Progress Notes (Signed)
Alert and oriented x3. 2+ mod assist stand pivot. Large continent BM in toilet today. Stools continue to be loose, scheduled Fibercon given. Voids in urinal during day with assistance. PEG tube removed today, dry dressing to stoma. D3 nectar diet restarted at lunch time, patient tolerating well. Will continue with plan of care. [name]

## 2011-01-15 NOTE — Progress Notes (Signed)
Recreational Therapy Assessment and Plan  Patient Details  Name: [name] MRN: [id number] Date of Birth: 1955-08-19  Rehab Potential:  Good  ELOS:   4 weeks  Assessment Clinical Impression: Re-evaluated pt for appropriateness of TR services.  Pt presents with decreased activity tolerance, decreased functional mobility, decreased balance, decreased cognition, ataxia limiting pts independence with leisure/community pursuits due to  right SCA CVA including midbrain and pons, acute large right cerebellar CVA with hemorrhagic transformation and edema compressing the 4th ventricle and mild hydrocephalus.   Plan Initially placed on HOLD for TR services 12/25/10.  Seen today and plan to see pt 1 time per week >20 minutes.  Discharge Criteria: Patient will be discharged from TR if patient refuses treatment 3 consecutive times without medical reason.  If treatment goals not met, if there is a change in medical status, if patient makes no progress towards goals or if patient is discharged from hospital.  The above assessment, treatment plan, treatment alternatives and goals were discussed and mutually agreed upon: by patient    TIME:  1-2 PM  Co-treat with PT working on dynamic standing balance EOM while playing tabletop card game using BUE's with Mod assist for balance, and min assist for coordination of UE's to manipulate cards.  No c/o pain.  Brother, Molly Maduro attended therapy and observed/participated in tasks.  Jakeline Dave 01/15/2011, 4:25 PM

## 2011-01-15 NOTE — Progress Notes (Signed)
Physical Therapy Session Note  Patient Details  Name: [name] MRN: [id number] Date of Birth: 05-05-1955  Today's Date: 01/15/2011 Time: 1300-1403 Time Calculation (min): 63 min  Precautions: Precautions Precautions: Fall Precaution Comments: monitor O2 during activity Required Braces or Orthoses: No Restrictions Weight Bearing Restrictions: No Other Position/Activity Restrictions: posterior lean  Short Term Goals: PT Short Term Goal 1: Patient will perform bed mobility and supine-sit with supervision. PT Short Term Goal 1 - Progress: Met PT Short Term Goal 2: Patient will transfer with min assist 3 of 5 trials. PT Short Term Goal 2 - Progress: Met PT Short Term Goal 3: Patient will maintain dynamic standing balance with min assist > 5 min for ADL task. PT Short Term Goal 3 - Progress: Progressing toward goal PT Short Term Goal 4: Patient will gait 15 feet with max assist. PT Short Term Goal 4 - Progress: Met PT Short Term Goal 5: Patient will propel W/C > 100 feet with min assist. PT Short Term Goal 5 - Progress: Met  Skilled Therapeutic Interventions/Progress Updates:     General Chart Reviewed: Yes Family/Caregiver Present: Yes (brother)   Pain Pain Assessment Pain Assessment: No/denies pain Pain Score: 0-No pain   Other Treatments  30 min co-treat with recreational therapist for standing dynamic balance activity with card playing task, focus on socialization, dynamic reaching in all planes, emergent awareness and self correction of posterior LOB, hip and ankle strategies min assist. Progressed to gait 50 feet x 2 with bilat hand held assist total assist + 2, patient = 25-50%, focus on increased BOS, right sided alignment and weight shift, and increased right hip abduction to prevent right LE from crossing midline in stance phase. Standing right hip abduction activity with blue bolster between LEs to increase BOS for balance reactions and increased active hip abduction  mod assist. Hands on family education with brother at end of session for squat pivot transfer with good return demo without PT assist.  Therapy/Group: Individual Therapy  [name] 01/15/2011, 6:20 PM

## 2011-01-15 NOTE — Progress Notes (Signed)
Occupational Therapy Note  Patient Details  Name: [name] MRN: [id number] Date of Birth: 02-06-1955 Today's Date: 01/15/2011  Group Therapy No c/o pain 11:45-1200 [employer]: Focus on self feeding, setting up own tray, min cuing for safe swallowing precautions, pacing self with min questioning cues (wore eye patch for meal.)   Melonie Florida 01/15/2011, 2:45 PM

## 2011-01-15 NOTE — Progress Notes (Signed)
Progress Notes  Speech Language Pathology Therapy Note  Patient Details  Name: [name] MRN: [id number] Date of Birth: [birth year]  Today's Date: 01/15/2011 Time: 0850-0930 Time Calculation (min): 40 min  Precautions: Precautions Precautions: Fall Precaution Comments: monitor O2 during activity Required Braces or Orthoses: No Restrictions Weight Bearing Restrictions: No Other Position/Activity Restrictions: posterior lean  Short Term Goals: set 01/12/11  1. Patient will consume dysphagia 3 (soft solids) and nectar-thick liquids showing no overt s/s of aspiration with the use of a chin tuck and other compensatory strategies with supervision assist  2. Patient will demonstrate day to day carryover of self care information with minimal assist semantic cues  3. Patient will demonstrate selective attention to task for 30 minutes with supervision assist semantic cues  4. Patient will initiate verbal expression with requests for help or for communicating information with minimal assist semantic cues  5. Patient will self monitor and correct verbal expression with supervision assist semantic cues  Skilled Therapeutic Interventions/Progress Updates: Patient had PEG tube remover by MD prior to session and currently NPO except for small sips; SLP facilitated session with supervision cues to perform oral care after set up and minimal assist at end due to decreased coordination.  Patient consumed 4 oz of thin liquid via straw with chin tuck with minimal assist semantic cues for small sips and for complete chin tuck. No overt s/s of aspiration noted during session.  SLP also facilitated session with minimal assist semantic cues faded to supervision semantic cues to organize, sequence information and alternate attention between three different categories during a card game and cue x1 to self monitor for accuracy with task.     Pain Pain Assessment Pain Assessment: No/denies pain Pain Score: 0-No  pain  Therapy/Group: Individual Therapy  Charlane Ferretti., CCC-SLP [phone] Areyanna Figeroa 01/15/2011 11:01 AM

## 2011-01-15 NOTE — Progress Notes (Signed)
Speech Language Pathology [employer] Note  Patient Details  Name: BLAYZE HAEN MRN: [id number] Date of Birth: 02-25-1955  Today's Date: 01/15/2011 Time: 1130-1145 Time Calculation (min): 15 min  Precautions: Precautions Precautions: Fall Precaution Comments: monitor O2 during activity Required Braces or Orthoses: No Restrictions Weight Bearing Restrictions: No Other Position/Activity Restrictions: posterior lean   Skilled Therapeutic Interventions: Pt seen in [employer] to increase utilization of swallowing compensatory strategies. Pt needed Max A verbal and tactile cues to utilize chin tuck with nectar-thick liquids and Mod verbal cues for slow pace with Dys. 3 textures. Pt is demonstrating minimal carryover of compensatory strategies.   Pain Pain Assessment Pain Assessment: No/denies pain Pain Score: 0-No pain  Therapy/Group: Dysphagia Group  Joakim Huesman 01/15/2011 12:52 PM

## 2011-01-16 ENCOUNTER — Inpatient Hospital Stay (HOSPITAL_COMMUNITY): Payer: 59

## 2011-01-16 DIAGNOSIS — J9600 Acute respiratory failure, unspecified whether with hypoxia or hypercapnia: Secondary | ICD-10-CM

## 2011-01-16 DIAGNOSIS — I6340 Cerebral infarction due to embolism of unspecified cerebral artery: Secondary | ICD-10-CM

## 2011-01-16 DIAGNOSIS — I69993 Ataxia following unspecified cerebrovascular disease: Secondary | ICD-10-CM

## 2011-01-16 DIAGNOSIS — Z5189 Encounter for other specified aftercare: Secondary | ICD-10-CM

## 2011-01-16 LAB — GLUCOSE, CAPILLARY
Glucose-Capillary: 141 mg/dL — ABNORMAL HIGH (ref 70–99)
Glucose-Capillary: 140 mg/dL — ABNORMAL HIGH (ref 70–99)
Glucose-Capillary: 115 mg/dL — ABNORMAL HIGH (ref 70–99)

## 2011-01-16 MED ORDER — BACLOFEN 5 MG HALF TABLET
5.0000 mg | ORAL_TABLET | Freq: Three times a day (TID) | ORAL | Status: DC
  Administered 2011-01-16 – 2011-01-19 (×9): 5 mg via ORAL
  Filled 2011-01-16 (×12): qty 1

## 2011-01-16 NOTE — Progress Notes (Signed)
Speech Language Pathology [employer] Note  Patient Details  Name: JAMONTA GOERNER MRN: [id number] Date of Birth: [birth year]  Today's Date: 01/16/2011 Time: 8295-6213 Time Calculation (min): 20 min  Precautions: Precautions Precautions: Fall Precaution Comments: monitor O2 during activity Required Braces or Orthoses: No Restrictions Weight Bearing Restrictions: No Other Position/Activity Restrictions: posterior lean  Skilled Therapeutic Interventions: Pt had MBSS today. Treatment focus on recall/utilization of diet upgrade and swallowing compensatory strategies. Pt able to recall liquid upgrade to thin liquids and new compensatory strategy of cough after sips of liquids with supervision question cues. Mod A verbal cues needed throughout meal for small bites and to utilize cough after sips of thin via cup. Cough X 1 at meal due to large bite. Independently initiated social interaction with other group members and directed RN on how to appropriately take medications.  Pain Pain Assessment Pain Assessment: No/denies pain Pain Score: 0-No pain   Therapy/Group: Dysphagia Group  Lylie Blacklock 01/16/2011 1:13 PM

## 2011-01-16 NOTE — Progress Notes (Signed)
Occupational Therapy Session Note  Patient Details  Name: [name] MRN: [id number] Date of Birth: 12/01/1955  Today's Date: 01/16/2011 Time: 0865-7846 Time Calculation (min): 55 min  Precautions: Precautions Precautions: Fall Precaution Comments: monitor O2 during activity Required Braces or Orthoses: No Restrictions Weight Bearing Restrictions: No Other Position/Activity Restrictions: posterior lean  Short Term Goals: OT Short Term Goal 1: Pt will complete bathing with mod assist in seated position OT Short Term Goal 1 - Progress: Met OT Short Term Goal 2: Pt will complete UB dressing with min assist  OT Short Term Goal 2 - Progress: Met OT Short Term Goal 3: Pt will complete LB dressing with max assist in sit to stand position OT Short Term Goal 3 - Progress: Met OT Short Term Goal 4: Pt will demonstrate improved unsupported sitting balance for 10 mins to complete UB bathing and dressing OT Short Term Goal 4 - Progress: Met  Skilled Therapeutic Interventions/Progress Updates:    Pt seen for ADL retraining at sink level per pt's request.  Focus on sit to stand, stand to sit, and static and dynamic standing balance.  Min cues for weight shifting to decrease posterior lean in standing.  Pt required assist to thread LLE through pull up and required steady assist in standing secondary to persistent posterior lean.  Neuro musc. Re-ed conducted in sitting and standing with focus on visual tracking, scanning, RUE graded movements with wrist weight to increase purposeful movements and decrease ataxia with FM tasks.  Pain  No c/o pain this session  Therapy/Group: Individual Therapy  Leonette Monarch 01/16/2011, 11:04 AM

## 2011-01-16 NOTE — Progress Notes (Signed)
Patient ID: [name], male   DOB: 1955/03/20, 56 y.o.   MRN: [id number] Patient ID: [name], male   DOB: August 20, 1955, 55 y.o.   MRN: [id number] Subjective/Complaints  Eating and drinking well Review of Systems  HENT: Negative for congestion.   Respiratory: Negative.   Gastrointestinal: Negative for nausea, abdominal pain and diarrhea.       PEG out  Genitourinary: Negative for frequency and hematuria.       Condom cath  Musculoskeletal: Negative for joint pain.  All other systems reviewed and are negative.  Doing well, occasional hiccups 1/4  Objective: Vital Signs: Blood pressure 164/97, pulse 74, temperature 98.2 F (36.8 C), temperature source Oral, resp. rate 18, height 6' (1.829 m), weight 98.3 kg (216 lb 11.4 oz), SpO2 97.00%. No results found. Results for orders placed during the hospital encounter of 12/22/10 (from the past 72 hour(s))  GLUCOSE, CAPILLARY     Status: Abnormal   Collection Time   01/13/11 11:53 AM      Component Value Range Comment   Glucose-Capillary 138 (*) 70 - 99 (mg/dL)    Comment 1 Notify RN     GLUCOSE, CAPILLARY     Status: Abnormal   Collection Time   01/13/11  4:17 PM      Component Value Range Comment   Glucose-Capillary 125 (*) 70 - 99 (mg/dL)   GLUCOSE, CAPILLARY     Status: Abnormal   Collection Time   01/13/11  9:13 PM      Component Value Range Comment   Glucose-Capillary 145 (*) 70 - 99 (mg/dL)   GLUCOSE, CAPILLARY     Status: Abnormal   Collection Time   01/14/11  7:13 AM      Component Value Range Comment   Glucose-Capillary 136 (*) 70 - 99 (mg/dL)    Comment 1 Notify RN     GLUCOSE, CAPILLARY     Status: Abnormal   Collection Time   01/14/11 11:31 AM      Component Value Range Comment   Glucose-Capillary 109 (*) 70 - 99 (mg/dL)    Comment 1 Notify RN     GLUCOSE, CAPILLARY     Status: Abnormal   Collection Time   01/14/11  4:15 PM      Component Value Range Comment   Glucose-Capillary 101 (*) 70 - 99 (mg/dL)   GLUCOSE,  CAPILLARY     Status: Abnormal   Collection Time   01/14/11  8:21 PM      Component Value Range Comment   Glucose-Capillary 162 (*) 70 - 99 (mg/dL)   GLUCOSE, CAPILLARY     Status: Abnormal   Collection Time   01/15/11  7:11 AM      Component Value Range Comment   Glucose-Capillary 112 (*) 70 - 99 (mg/dL)    Comment 1 Notify RN     GLUCOSE, CAPILLARY     Status: Abnormal   Collection Time   01/15/11 11:48 AM      Component Value Range Comment   Glucose-Capillary 103 (*) 70 - 99 (mg/dL)    Comment 1 Notify RN     GLUCOSE, CAPILLARY     Status: Abnormal   Collection Time   01/15/11  4:55 PM      Component Value Range Comment   Glucose-Capillary 133 (*) 70 - 99 (mg/dL)    Comment 1 Notify RN     GLUCOSE, CAPILLARY     Status: Abnormal   Collection Time  01/15/11  9:17 PM      Component Value Range Comment   Glucose-Capillary 187 (*) 70 - 99 (mg/dL)    Comment 1 Notify RN         .  Patient sitting comfortably in his Bed. Patient has weak cough  Trach site with granulation tissue at base, stoma is closed. He is fair oral motor control. No gross cranial nerve abnormalities I can see on exam today. Patient was able to follow simple one-step commands. He communicated with simple yes no and shakes.  He had diminished fine motor movement in all 4 limbs. He may have had some underlying ataxia on the right  Strength was grossly3+/5 proximally in both upper limbs. Lower extremity grossly3/5 proximal to3/5 distally. Patient did have sensation to gross pain stimulation. Reflexes are 1+ grossly throughout. Patient had poor insight and and awareness. Heart was irregularly irregular. Chest was clear. Abdomen soft nontender. PEG site was clean and intact with no drainage. Skin throughout was generally intact. Trace extremity edema. All 4 limbs were warm with 2+ pulses. Right wrist no tenderness to palp or with ROM R foot swelling no erythema, mild pain with ROM PEG site dry and scabbed over already.  Trach  site with hypergranulation. Exam 1/4  Assessment/Plan: 1. Functional deficits secondary to R cerebellar ICH severe R UE and R LE ataxia which require 3+ hours per day of interdisciplinary therapy in a comprehensive inpatient rehab setting.  Team conf today Physiatrist is providing close team supervision and 24 hour management of active medical problems listed below. Physiatrist and rehab team continue to assess barriers to discharge/monitor patient progress toward functional and medical goals. Mobility: Bed Mobility Bed Mobility: Yes Rolling Right: 2: Max assist;With rail Rolling Left: 3: Mod assist Right Sidelying to Sit: With rails;2: Max assist Sitting - Scoot to Edge of Bed: 1: +1 Total assist;Other (comment) (significant posterior right lean) Transfers Sit to Stand: 1: +1 Total assist Sit to Stand Details (indicate cue type and reason): significant posterior right lean, slow to initiate movement Stand to Sit: 1: +1 Total assist Stand Pivot Transfer Details (indicate cue type and reason): poor LE eccentric control with uncontrolled descent, significant posterior right lean Squat Pivot Transfers: 1: +2 Total assist Squat Pivot Transfer Details (indicate cue type and reason): total assist + 2 (pt = 50%), poor graded movement and weight shift, slow to initiate with LEs Ambulation/Gait Ambulation/Gait Assistance: 2: Max assist Stairs: No (pre-gait level at this time, unsafe to attempt) Naval architect Mobility: No  ADL:    Cognition: Cognition Overall Cognitive Status: Impaired Arousal/Alertness: Awake/alert Orientation Level: Oriented X4 Attention: Selective Sustained Attention: Impaired Sustained Attention Impairment: Functional basic Selective Attention: Impaired Selective Attention Impairment: Functional basic Memory: Impaired Memory Impairment: Decreased recall of new information;Decreased long term memory Decreased Long Term Memory: Verbal  complex Awareness: Impaired Awareness Impairment: Emergent impairment Problem Solving: Impaired Problem Solving Impairment: Verbal basic;Functional basic Executive Function: Self Monitoring;Self Correcting;Initiating Initiating: Impaired Initiating Impairment: Functional basic;Verbal basic (minimal cues) Self Monitoring: Impaired Self Monitoring Impairment: Functional basic Self Correcting: Impaired Self Correcting Impairment: Verbal basic;Functional basic Safety/Judgment: Appears intact Cognition Arousal/Alertness: Awake/alert Orientation Level: Oriented X4  2. Anticoagulation/DVT prophylaxis with Pharmaceutical:no anticoag use SCDs 3. Pain Management:monitor, tylenol for non-specific right foot pain. Patient Active Hospital Problem List: 4.  Afib cannot use warfarin due to ICH resume baby ASA Diabetes mellitus (11/19/2010)    POA: Yes 5.  LUE edema improving 6.   DM Lantus monitor CBG--good control  at present. No change in regimen. 7. Dysphagia nectar liquids plus H2O protocol. Eating well.    8.Gout (11/20/2010) -allopurinol currently.  Off colchicine 9..  Hypo K monitor no diuretics. Recheck cont mild low increase KCL       10.  HTN controlled 25    11. PEG tube removed today without difficulty 4x4 dressing applied.  May resume meals at lunch today.  -needs silver nitrate to trach site    [facility] T 01/16/2011, 7:27 AM

## 2011-01-16 NOTE — Progress Notes (Signed)
Progress Notes  Speech Language Pathology Therapy Note  Patient Details  Name: [name] MRN: [id number] Date of Birth: 05-Nov-1955  Today's Date: 01/16/2011 Time: 1100-1130 Time Calculation (min): 30 min  Precautions: Precautions Precautions: Fall Precaution Comments: monitor O2 during activity Required Braces or Orthoses: No Restrictions Weight Bearing Restrictions: No Other Position/Activity Restrictions: posterior lean  Short Term Goals: set 01/12/11  1. Patient will consume dysphagia 3 (soft solids) and nectar-thick liquids showing no overt s/s of aspiration with the use of a chin tuck and other compensatory strategies with supervision assist (Goal Met) 1. Patient will consume regular textures and thin liquids via cup with minimal assist semantic cues to utilize compensatory strategies  2. Patient will demonstrate day to day carryover of self care information with minimal assist semantic cues  3. Patient will demonstrate selective attention to task for 30 minutes with supervision assist semantic cues  4. Patient will initiate verbal expression with requests for help or for communicating information with minimal assist semantic cues  5. Patient will self monitor and correct verbal expression with supervision assist semantic cues  Skilled Therapeutic Interventions/Progress Updates: Session focused on patient and family (brother Molly Maduro) education of upgraded diet, liquids and precautions.  Patient required minimal assist semantic cues to take small cup sips and utilize a cough after as an airway protections stratey.  Brother present and asking questions regarding oral care and water protocol and medication admiistration.  Explained oral care importance but not every time he drinks thin liquids.  Stressed self feeding and small cup sips followed by a cough as important factors to reduce risk of aspiration PNA.    Precautions/Restrictions  Precautions Precautions: Fall Required  Braces or Orthoses: No Restrictions Weight Bearing Restrictions: No   Pain Pain Assessment Pain Assessment: No/denies pain Pain Score: 0-No pain Hiccups persist; RN notified  Oral/Motor: Oral Motor/Sensory Function Labial ROM: Reduced left Labial Symmetry: Abnormal symmetry left;Abnormal symmetry right Labial Strength: Reduced Lingual ROM: Reduced right;Reduced left Lingual Strength: Reduced Motor Speech Articulation: Impaired (supervision cues) Intelligibility: Intelligibility reduced (supervision cues) Comprehension: Auditory Comprehension Yes/No Questions: Impaired Basic Immediate Environment Questions: 75-100% accurate Complex Questions: 75-100% accurate Commands: Impaired Multistep Basic Commands: 75-100% accurate Conversation: Simple Interfering Components: Anxiety;Processing speed (trach with PMSV) EffectiveTechniques: Extra processing time;Repetition Visual Recognition/Discrimination Discrimination: Exceptions to WFL (no glasses or contacts) Reading Comprehension Reading Status: Impaired Interfering Components: Attention;Visual scanning;Processing time;Visual acuity;Eye glasses not available Effective Techniques: Large print;Other (comment) (simple print) Expression: Expression Primary Mode of Expression: Verbal Verbal Expression Overall Verbal Expression: Impaired Initiation: Impaired (minimal cues) Automatic Speech: Day of week;Month of year (DOW 6/7, MOY perseverated on DOW) Level of Generative/Spontaneous Verbalization: Word Repetition: No impairment Naming: Not tested Pragmatics: Impairment Impairments: Monotone;Eye contact Interfering Components: Attention Effective Techniques: Open ended questions;Semantic cues Non-Verbal Means of Communication: Not applicable Written Expression Written Expression: Exceptions to [facility] Self Formulation Ability: Word (moderate cues for organization)  Therapy/Group: Individual Therapy  Charlane Ferretti.,  CCC-SLP [phone] Julion Gatt 01/16/2011 2:36 PM

## 2011-01-16 NOTE — Progress Notes (Signed)
Occupational Therapy Note [employer]  Patient Details  Name: [name] MRN: [id number] Date of Birth: 02-Jan-1956 Today's Date: 01/16/2011 Time:  1130-1145 15 min Pain:  No report of pain.  Patient bothered by hiccups.  Nurse arrived during session to offer baclofen Skilled Clinical Intervention:  Patient participated in [employer] to address safe swallowing techniques with upgraded diet.  Patient had MBSS today and was upgraded to thin liquids.  Patient aware of continued need for taking pills in applesauce!!  Patient fed self 100% meal with dominant right hand. Group treatment session    [name] 01/16/2011, 2:13 PM

## 2011-01-16 NOTE — Procedures (Signed)
Modified Barium Swallow Procedure Note Patient Details  Name: [name] MRN: [id number] Date of Birth: 1955/06/02  Today's Date: 01/16/2011 Time: 4098-1191  30 minutes  Past Medical History:  Past Medical History  Diagnosis Date  . Diabetes mellitus   . Coronary artery disease   . Hypertension   . Gout   . CHF (congestive heart failure)   . Afib   . Arthritis   . Hypertensive emergency 11/19/2010  . Pulmonary edema 11/19/2010  . Acute exacerbation of congestive heart failure 11/19/2010  . Thyroiditis 11/20/2010  . ICH (intracerebral hemorrhage) 12/22/2010  . Stroke 11/19/2010  . Respiratory failure 11/19/2010  . CAD (coronary artery disease) 11/20/2010  . Physical deconditioning 12/22/2010  . Diabetes mellitus 11/19/2010  . A-fib 11/19/2010  . Hyperlipemia 11/21/2010   Past Surgical History:  Past Surgical History  Procedure Date  . Tracheostomy tube placement 11/28/2010    Procedure: TRACHEOSTOMY;  Surgeon: Susy Frizzle, MD;  Location: [facility] OR;  Service: ENT;  Laterality: N/A;  . Peg placement 12/03/2010    Procedure: PERCUTANEOUS ENDOSCOPIC GASTROSTOMY (PEG) PLACEMENT;  Surgeon: Hart Carwin, MD;  Location: [facility] ENDOSCOPY;  Service: Endoscopy;  Laterality: N/A;   HPI:   [name] is an 56 y.o. male with H/O COPD, DM, A fib, CAD, CHF, and gout admitted 11/19/2010 with acute onset of slurred speech and left sided weakness. CT head without acute abnormality. Patient became diaphoretic with increased WOB with inability to speak, LUE flaccidity while in Xray. He was intubated for airway support. CT angio chest without PE and evidence of CHF. CTA head and neck without thrombus, distal left vertebral artery stenosis and basilar artery stenosis. MRI brain with acute subacute infarct right SCA territory including mid brain and upper pons. Extubate briefly on 11/09 but later that day with unresponsiveness and F/U head CT with large right cerebellar infarct with hemorraghic transformation with  edema compressing 4th ventricle and mild obstructive hydrocephalus with recommendation to reverse coumadin and heparin.   On 11/10 patient, had ventriculostomy placed with good drainage of CSF. Patient comatose and unresponsive. Patient trached by Dr Pollyann Kennedy on 11/16 and PEG placed 11/21 by Dr. Juanda Chance. Developed fevers and diarrhea, started on flagyl for questionable C diff. Ventilator wean initiated On, 11/24 with worsening of MS and repeat CT head with slight increase in hemorrhage and questionable new right parietal infarct and patient reintubated. Dr Phoebe Perch consulted and no surgical intervention recommended. Vent wean initiated 11/28 and patient tolerated extubation. Noted to have blood in stools with ?lower GIB. GI recommended serial hemoglobin checks. Acute stomal bleeding on 12/3 treated with change and placement of #8 cuffed trach. Blood pressures remain labile and A Fib treated with IV Cardizem. Dr [name] consulted for input on BP/afib management and recommended adding labetalol and ace inhibitors for better control. PMSV trials ongoing and patient with difficulty tolerating trials due to #8. He was downsized to a #6 cuffed trach today.   Patient transferred to CIR on 12/22/2010 . PMSV evaluation 12/23/10 and MBSS 12/24/10 with dysphagia 2 textures and nectar-thick liquids with chin tuck. Patient decannulation 12/31/10. Repeat MBSS 01/02/11 with limited functional change in swallow.  Has been on free water protocol and is now showing no overt s/s of aspiration objective study today to assess for silent aspiration.  Recommendation/Prognosis  Clinical Impression Dysphagia Diagnosis: Mild pharyngeal phase dysphagia Clinical impression: Patient presents with mild sensory motor pharyngeal dysphagia charcterized by premanture loss of bolus to the vallecula and at times pyriform sinuses  with a delay in swallow (which is impated by distractibility) which results in trace laryngeal penetration and with large  sips silent aspiraition.  Despite hard cough, after a cue patiient was unable to remove all aspirates from airway; however, with verbal cues to take small "hot coffee" sips patient with occassional trace penetration, which was mostly cleared with a cues to cough.  As a result, patient deemed safe to upagrade diet to regular textures and thin liquids via cup with strict use of compensatory strategies.    Recommendations Solid Consistency: Regular Liquid Consistency: Thin Liquid Administration via: Cup Medication Administration: Whole meds with puree Supervision: Patient able to self feed;Full supervision/cueing for compensatory strategies (small cups sips followed by a cough) Compensations: Slow rate;Small sips/bites;Hard cough after swallow Postural Changes and/or Swallow Maneuvers: Seated upright 90 degrees Oral Care Recommendations: Oral care BID Follow up Recommendations: Home health SLP;24 hour supervision/assistance  Prognosis Prognosis for Safe Diet Advancement: Good Barriers/Prognosis Comment: ataxia; ability to carryover and self monitor Individuals Consulted Consulted and Agree with Results and Recommendations: Patient;Family member/caregiver Family Member Consulted: brother-[name]  SLP Assessment/Plan  See IPOC   SLP Goals   1. Consume regular textures and thin liquids via cup with minimal assist semantic cues to follow compensatory strategies with out overt s/s of aspiration.  General:  Date of Onset: 11/19/10 Type of Study: Repeat MBS Diet Prior to this Study: Dysphagia 3 (soft);Nectar-thick liquids Temperature Spikes Noted: No Respiratory Status: Room air Trach Size and Type: Other (Comment) (decanulated) History of Intubation: Yes Length of Intubations (days): 6 days Date extubated: 11/28/10 Behavior/Cognition: Alert;Cooperative;Impulsive;Distractible;Requires cueing Oral Cavity - Dentition: Adequate natural dentition Oral Motor / Sensory Function: Impaired  motor Vision: Functional for self-feeding Patient Positioning: Upright in chair Baseline Vocal Quality: Normal Volitional Cough: Strong Volitional Swallow: Able to elicit (delayed) Anatomy: Within functional limits Pharyngeal Secretions: Not observed secondary MBS Ice chips: Not tested  Reason for Referral:  Assess for silent aspiration to determine risk prior to discharge  Oral Phase  WFL Pharyngeal Phase  Pharyngeal Phase Pharyngeal Phase: Impaired Pharyngeal - Honey Pharyngeal - Honey Cup: Not tested Pharyngeal - Nectar Pharyngeal - Nectar Cup: Premature spillage to valleculae;Pharyngeal residue - pyriform;Delayed swallow initiation;Pharyngeal residue - valleculae Penetration/Aspiration details (nectar cup): Material does not enter airway Pharyngeal - Nectar Straw: Delayed swallow initiation;Premature spillage to pyriform;Pharyngeal residue - valleculae;Pharyngeal residue - pyriform;Premature spillage to valleculae Penetration/Aspiration details (nectar straw): Material does not enter airway Pharyngeal - Thin Pharyngeal - Thin Cup: Delayed swallow initiation;Premature spillage to pyriform;Premature spillage to valleculae;Penetration/Aspiration during swallow;Trace aspiration;Compensatory strategies attempted (Comment) (cues for small sips resulted in trace penetration) Penetration/Aspiration details (thin cup): Material enters airway, passes BELOW cords without attempt by patient to eject out (silent aspiration) (cued cough effective at reducing amount) Pharyngeal - Thin Straw: Not tested Pharyngeal - Solids Pharyngeal - Puree: Not tested Pharyngeal - Regular: Not tested Pharyngeal - Pill: Not tested Cervical Esophageal Phase  Cervical Esophageal Phase Cervical Esophageal Phase: [facility]    Fae Pippin, M.A., CCC-SLP [phone] [name] 01/16/2011, 10:17 AM

## 2011-01-16 NOTE — Progress Notes (Signed)
Physical Therapy Session Note  Patient Details  Name: [name] MRN: [id number] Date of Birth: 11/02/1955  Today's Date: 01/16/2011 Time: 0800-0840 Time Calculation (min): 40 min  Precautions: Precautions Precautions: Fall Precaution Comments: monitor O2 during activity Required Braces or Orthoses: No Restrictions Weight Bearing Restrictions: No Other Position/Activity Restrictions: posterior lean  Short Term Goals: PT Short Term Goal 1: Patient will perform bed mobility and supine-sit with supervision. PT Short Term Goal 1 - Progress: Met PT Short Term Goal 2: Patient will transfer with min assist 3 of 5 trials. PT Short Term Goal 2 - Progress: Met PT Short Term Goal 3: Patient will maintain dynamic standing balance with min assist > 5 min for ADL task. PT Short Term Goal 3 - Progress: Met PT Short Term Goal 4: Patient will gait 15 feet with max assist. PT Short Term Goal 4 - Progress: Met PT Short Term Goal 5: Patient will propel W/C > 100 feet with min assist. PT Short Term Goal 5 - Progress: Met  Skilled Therapeutic Interventions/Progress Updates:     General Chart Reviewed: Yes Amount of Missed PT Time (min): 20 Minutes (due to breakfast) Family/Caregiver Present: Yes (brother)   Pain Pain Assessment Pain Assessment: No/denies pain Pain Score: 0-No pain  Other Treatments  Continued hands on family education with brother for squat-pivot transfers, W/C mobility and parts management, and car transfer to sedan height using squat-pivot technique. Patient and brother verbalized and demonstrated understanding without assist. Discussed discharge at W/C level, gait with therapies only. Patient and brother verbalized agreement. Patient supervision for W/C mobility, parts management and transfer set-up throughout session with min assist for squat-pivot transfers and car transfer.  Therapy/Group: Individual Therapy  [name] 01/16/2011, 12:17 PM

## 2011-01-16 NOTE — Progress Notes (Signed)
1+ mod stand pivot. Pt Alert and appropriate with response requiring some additional time. C/o hiccups through out most of AM. Increased Baclofen to tid to aid with decreasing incidence of hiccups. Swallow eval this AM with dietary adjustments. Continent of bowel and bladder. No c/o pain. Participating with therapy. Continue plan of care.

## 2011-01-17 LAB — GLUCOSE, CAPILLARY: Glucose-Capillary: 135 mg/dL — ABNORMAL HIGH (ref 70–99)

## 2011-01-17 MED ORDER — SILVER SULFADIAZINE 1 % EX CREA
TOPICAL_CREAM | Freq: Two times a day (BID) | CUTANEOUS | Status: DC
  Administered 2011-01-17 – 2011-01-19 (×5): via TOPICAL
  Filled 2011-01-17: qty 50

## 2011-01-17 NOTE — Progress Notes (Signed)
Moderate assitance for transfer. Dressing changed to tracheostomy site and peg tube with silvadene cream per md order. Denies pain this shift.takes medicine whole with apple sauce.

## 2011-01-17 NOTE — Progress Notes (Signed)
Patient ID: [name], male   DOB: 29-Nov-1955, 56 y.o.   MRN: [id number] Patient ID: [name], male   DOB: 24-Dec-1955, 56 y.o.   MRN: [id number] Patient ID: [name], male   DOB: Aug 24, 1955, 56 y.o.   MRN: [id number] Subjective/Complaints  Eating and drinking well Review of Systems  HENT: Negative for congestion.   Respiratory: Negative.   Gastrointestinal: Negative for nausea, abdominal pain and diarrhea.       PEG out  Genitourinary: Negative for frequency and hematuria.       Condom cath  Musculoskeletal: Negative for joint pain.  All other systems reviewed and are negative.  Doing well, occasional hiccups 1/4. I was asked to check his post-trach and post-peg wounds  Objective: Vital Signs: Blood pressure 146/93, pulse 72, temperature 97.9 F (36.6 C), temperature source Oral, resp. rate 20, height 6' (1.829 m), weight 216 lb 0.8 oz (98 kg), SpO2 97.00%. Dg Swallowing Func-no Report  01/16/2011  CLINICAL DATA: assess presence of silent aspiration of thin liquids   FLUOROSCOPY FOR SWALLOWING FUNCTION STUDY:  Fluoroscopy was provided for swallowing function study, which was  administered by a speech pathologist.  Final results and recommendations  from this study are contained within the speech pathology report.     Results for orders placed during the hospital encounter of 12/22/10 (from the past 72 hour(s))  GLUCOSE, CAPILLARY     Status: Abnormal   Collection Time   01/14/11 11:31 AM      Component Value Range Comment   Glucose-Capillary 109 (*) 70 - 99 (mg/dL)    Comment 1 Notify RN     GLUCOSE, CAPILLARY     Status: Abnormal   Collection Time   01/14/11  4:15 PM      Component Value Range Comment   Glucose-Capillary 101 (*) 70 - 99 (mg/dL)   GLUCOSE, CAPILLARY     Status: Abnormal   Collection Time   01/14/11  8:21 PM      Component Value Range Comment   Glucose-Capillary 162 (*) 70 - 99 (mg/dL)   GLUCOSE, CAPILLARY     Status: Abnormal   Collection Time   01/15/11   7:11 AM      Component Value Range Comment   Glucose-Capillary 112 (*) 70 - 99 (mg/dL)    Comment 1 Notify RN     GLUCOSE, CAPILLARY     Status: Abnormal   Collection Time   01/15/11 11:48 AM      Component Value Range Comment   Glucose-Capillary 103 (*) 70 - 99 (mg/dL)    Comment 1 Notify RN     GLUCOSE, CAPILLARY     Status: Abnormal   Collection Time   01/15/11  4:55 PM      Component Value Range Comment   Glucose-Capillary 133 (*) 70 - 99 (mg/dL)    Comment 1 Notify RN     GLUCOSE, CAPILLARY     Status: Abnormal   Collection Time   01/15/11  9:17 PM      Component Value Range Comment   Glucose-Capillary 187 (*) 70 - 99 (mg/dL)    Comment 1 Notify RN     GLUCOSE, CAPILLARY     Status: Abnormal   Collection Time   01/16/11  7:31 AM      Component Value Range Comment   Glucose-Capillary 118 (*) 70 - 99 (mg/dL)    Comment 1 Notify RN     GLUCOSE, CAPILLARY  Status: Abnormal   Collection Time   01/16/11 11:24 AM      Component Value Range Comment   Glucose-Capillary 141 (*) 70 - 99 (mg/dL)    Comment 1 Notify RN     GLUCOSE, CAPILLARY     Status: Abnormal   Collection Time   01/16/11  4:37 PM      Component Value Range Comment   Glucose-Capillary 140 (*) 70 - 99 (mg/dL)    Comment 1 Notify RN     GLUCOSE, CAPILLARY     Status: Abnormal   Collection Time   01/16/11  9:05 PM      Component Value Range Comment   Glucose-Capillary 115 (*) 70 - 99 (mg/dL)    Comment 1 Notify RN     GLUCOSE, CAPILLARY     Status: Normal   Collection Time   01/17/11  7:42 AM      Component Value Range Comment   Glucose-Capillary 78  70 - 99 (mg/dL)    Comment 1 Notify RN         .  Patient sitting comfortably in his Bed. Patient has weak cough  Trach site with granulation tissue at base, stoma is closed. He is fair oral motor control. No gross cranial nerve abnormalities I can see on exam today. Patient was able to follow simple one-step commands. He communicated with simple yes no and shakes.  He  had diminished fine motor movement in all 4 limbs. He may have had some underlying ataxia on the right  Strength was grossly3+/5 proximally in both upper limbs. Lower extremity grossly3/5 proximal to3/5 distally. Patient did have sensation to gross pain stimulation. Reflexes are 1+ grossly throughout. Patient had poor insight and and awareness. Heart was irregularly irregular. Chest was clear. Abdomen soft nontender. PEG site was clean and intact with no drainage. Skin throughout was generally intact. Trace extremity edema. All 4 limbs were warm with 2+ pulses. Right wrist no tenderness to palp or with ROM R foot swelling no erythema, mild pain with ROM PEG site dry and scabbed over already.  Trach site with hypergranulation and some d/c. Exam 1/4  Assessment/Plan: 1. Functional deficits secondary to R cerebellar ICH severe R UE and R LE ataxia which require 3+ hours per day of interdisciplinary therapy in a comprehensive inpatient rehab setting.  Team conf today Physiatrist is providing close team supervision and 24 hour management of active medical problems listed below. Physiatrist and rehab team continue to assess barriers to discharge/monitor patient progress toward functional and medical goals. Mobility: Bed Mobility Bed Mobility: Yes Rolling Right: 2: Max assist;With rail Rolling Left: 3: Mod assist Right Sidelying to Sit: With rails;2: Max assist Sitting - Scoot to Edge of Bed: 1: +1 Total assist;Other (comment) (significant posterior right lean) Transfers Sit to Stand: 1: +1 Total assist Sit to Stand Details (indicate cue type and reason): significant posterior right lean, slow to initiate movement Stand to Sit: 1: +1 Total assist Stand Pivot Transfer Details (indicate cue type and reason): poor LE eccentric control with uncontrolled descent, significant posterior right lean Squat Pivot Transfers: 1: +2 Total assist Squat Pivot Transfer Details (indicate cue type and reason): total  assist + 2 (pt = 50%), poor graded movement and weight shift, slow to initiate with LEs Ambulation/Gait Ambulation/Gait Assistance: 2: Max assist Stairs: No (pre-gait level at this time, unsafe to attempt) Naval architect Mobility: No  ADL:    Cognition: Cognition Overall Cognitive Status: Impaired Arousal/Alertness: Awake/alert Orientation  Level: Oriented to person;Oriented to place;Oriented to time Attention: Selective Sustained Attention: Impaired Sustained Attention Impairment: Functional basic Selective Attention: Impaired Selective Attention Impairment: Functional basic Memory: Impaired Memory Impairment: Decreased recall of new information;Decreased long term memory Decreased Long Term Memory: Verbal complex Awareness: Impaired Awareness Impairment: Emergent impairment Problem Solving: Impaired Problem Solving Impairment: Verbal basic;Functional basic Executive Function: Self Monitoring;Self Correcting;Initiating Initiating: Impaired Initiating Impairment: Functional basic;Verbal basic (minimal cues) Self Monitoring: Impaired Self Monitoring Impairment: Functional basic Self Correcting: Impaired Self Correcting Impairment: Verbal basic;Functional basic Safety/Judgment: Appears intact Cognition Arousal/Alertness: Awake/alert Orientation Level: Oriented to person;Oriented to place;Oriented to time  2. Anticoagulation/DVT prophylaxis with Pharmaceutical:no anticoag use SCDs 3. Pain Management:monitor, tylenol for non-specific right foot pain. Patient Active Hospital Problem List: 4.  Afib cannot use warfarin due to ICH resume baby ASA Diabetes mellitus (11/19/2010)    POA: Yes 5.  LUE edema improving 6.   DM Lantus monitor CBG--good control at present. No change in regimen. 7. Dysphagia nectar liquids plus H2O protocol. Eating well.    8.Gout (11/20/2010) -allopurinol currently.  Off colchicine 9..  Hypo K monitor no diuretics. Recheck cont mild low  increase KCL       10.  HTN controlled 26    11. PEG tube removed today without difficulty 4x4 dressing applied.  May resume meals at lunch today.  -needs silver nitrate to trach site  12. Trach site wound - healing. Wil use silvadine for dressings    [name] 01/17/2011, 9:32 AM

## 2011-01-17 NOTE — Progress Notes (Signed)
Occupational Therapy Note  Patient Details  Name: [name] MRN: [id number] Date of Birth: 06-Jun-1955 Today's Date: 01/17/2011 Pain None Individual Therapy Time:  1330-1415   Engaged in standing activity at high low table.  Gave pt a target for him to aim with his abdomen.  Pt. Stood for 4-6 minutes before resting x3.  Addressed toilet transfer with brother, Molly Maduro doing transfer with minimal instructional cues.  Pt did own pericare and donned/doffed pants.     Humberto Seals 01/17/2011, 1:03 PM

## 2011-01-17 NOTE — Progress Notes (Signed)
Speech Pathology: Dysphagia Treatment Note  Group session  1130-1200   Patient was observed with : Regular and Thin liquids.  Patient was noted to have s/s of aspiration : Yes: stronger reflexive cough x3 during whole meal  Lung Sounds:  WNL Temperature: WNL  Patient required: minimal assist semanitc cues to consistently follow precautions/strategies  Clinical Impression: SLP facilitated session with set up assist and minimal assist semantic cues to perform a hard cough after each small cup sip of thin liquid due to decreased recall of new strategy per [facility] 01/16/11.  Due to ataxia, at times patient has difficulty taking small cup sips about x3 during whole meal and 4oz of liquid which resulted in a stronger reflexive cough response following sip.   Recommendations:  Patient close to goals, complete family education  Pain:   none Intervention Required:   No  Goals: Goals Partially Met  Fae Pippin, M.A., CCC-SLP [phone]

## 2011-01-17 NOTE — Progress Notes (Signed)
Progress Notes  Speech Language Pathology Therapy Note  Patient Details  Name: [name] MRN: [id number] Date of Birth: [birth year]  Today's Date: 01/17/2011 Time: 1300-1315 Time Calculation (min): 15 min  Precautions: Precautions Precautions: Fall Precaution Comments: monitor O2 during activity Required Braces or Orthoses: No Restrictions Weight Bearing Restrictions: No Other Position/Activity Restrictions: posterior lean  Pain: 0  Skilled Therapeutic Interventions/Progress Updates: Session focused on recall and carryover of information pertaining to safety and care upon discharge with brother present for family education.  Patient required minimal assist -supervision assist semantic cues to recall safe swallow precautions and minimal assist cues during previous lunch session to perform a hard cough after each small cup sip.  SLP educated on known, small aspiration risk with thin liquids with biggest factors being size of sip, consistent use of cough after sips, patient feeding self, oral bacteria and mobility status.  Educated on need for 24/7 supervision upon discharge due to decreased ability to recall information such as cough after every sip, as well as overall safety.   Therapy/Group: Individual Therapy   Speech Language Pathology Discharge Summary   Long term goals set: 10  Long term goals met: 10  Comments on progress toward goals: Patient has made great functional gains during stay on CIR.  Initially upon admission patient was NPO, with a trach and trials of a PMSV, and max-total assist for cognition.  Patient is now decannulated and verbally expressing wants and needs with supervision cues to slow rate of speech due to ataxia dysarthria and consuming regular textures and thin liquids via cup with minimal assist semantic cues to recall and perform small cup sips and a hard cough after every sip.  Patient slowly progressed from nectar-thick liquids to thin liquids and as a  result this SLP recommends a follow up objective study in about 4 weeks per SLP at the next level of care.  Patient's overall cognition has improved to a supervision-minimal assist level with cues to initiate and direct care, as well as recall information and events.  Due to visual deficits use of written compensatory aids has been limited but recommend SLP at next level of care attempt once patient has access to contacts/glasses.    Reasons goals not met: n/a  Equipment acquired: none  Reasons for discharge: treatment goals met  Follow-up: Home Health  Patient/family agrees with progress made and goals achieved: Yes  Charlane Ferretti., CCC-SLP [phone] [name] 01/17/2011 1:39 PM

## 2011-01-18 ENCOUNTER — Inpatient Hospital Stay (HOSPITAL_COMMUNITY): Payer: 59

## 2011-01-18 LAB — GLUCOSE, CAPILLARY: Glucose-Capillary: 104 mg/dL — ABNORMAL HIGH (ref 70–99)

## 2011-01-18 MED ORDER — DOXAZOSIN MESYLATE 2 MG PO TABS
2.0000 mg | ORAL_TABLET | Freq: Every day | ORAL | Status: DC
  Administered 2011-01-18: 2 mg via ORAL
  Filled 2011-01-18 (×2): qty 1

## 2011-01-18 NOTE — Progress Notes (Signed)
Occupational Therapy Session Note  Patient Details  Name: [name] MRN: [id number] Date of Birth: [birth year]  Today's Date: 01/18/2011 Time: 1025-1125 Time Calculation (min): 60 min  Precautions: Precautions Precautions: Fall Precaution Comments: monitor O2 during activity Required Braces or Orthoses: No Restrictions Weight Bearing Restrictions: No Other Position/Activity Restrictions: posterior lean   Skilled Therapeutic Interventions/Progress Updates:    Addressed ADL retraining at tub/shower level with brother Molly Maduro) present for hands on training.  Addressed education on transfers, stand pivot to toilet and bench, standing balance, half standing for pericare.  Pt/brother returned education on above with minimal instructional cues.  Instructed pt/brother about getting a HH shower hose, rubber bath mat, grab bar for tub area.  SEE FIM for functional measures.          Pain Pain Assessment Pain Assessment: No/denies pain Pain Score: 0-No pain ADL:  See FIM   Therapy/Group: Individual Therapy  Humberto Seals 01/18/2011, 3:40 PM

## 2011-01-18 NOTE — Progress Notes (Signed)
Physical Therapy Session Note  Patient Details  Name: [name] MRN: [id number] Date of Birth: 01-16-1955  Today's Date: 01/18/2011 Time: 0900 - 0955  55 minutes  Precautions: Precautions Precautions: Fall Precaution Comments: monitor O2 during activity Required Braces or Orthoses: No Restrictions Weight Bearing Restrictions: No Other Position/Activity Restrictions: posterior lean  Short Term Goals: PT Short Term Goal 1: Patient will perform bed mobility and supine-sit with supervision. PT Short Term Goal 1 - Progress: Met PT Short Term Goal 2: Patient will transfer with min assist 3 of 5 trials. PT Short Term Goal 2 - Progress: Met PT Short Term Goal 3: Patient will maintain dynamic standing balance with min assist > 5 min for ADL task. PT Short Term Goal 3 - Progress: Progressing toward goal PT Short Term Goal 4: Patient will gait 15 feet with max assist. PT Short Term Goal 4 - Progress: Met PT Short Term Goal 5: Patient will propel W/C > 100 feet with min assist. PT Short Term Goal 5 - Progress: Met  Skilled Therapeutic Interventions/Progress Updates:     General Chart Reviewed: Yes Family/Caregiver Present: Yes (brother)   Pain Pain Assessment Pain Assessment: No/denies pain Pain Score: 0-No pain   Other Treatments  Brother present for session this morning. Patient and brother performed squat-pivot transfers throughout session without PT assist, including one to and from the toilet with toileting without PT assist. Discussed discharge at W/C level with gait with therapies only. Patient and brother verbalized agreement with this plan. W/C mobility to and from therapy gym with bilat UEs and LEs supervision, focus on coordination and W/C set-up and parts management for transfers. Standing dynamic pre-gait activities with emphasis on alignment of right LE, increased BOS, and anterior left weight shift min assist, progressing to gait with Carley Hammed walker 50 feet x 2 mod assist.  Manual facilitation at rib cage to increase anterior left weight shift, mirror for visual feedback to assist in positioning right LE more laterally to prevent scissoring, theraband tied around Eval walker and patient's right LE to further increase biofeedback and facilitate right LE position and weight shift for right stance phase of gait. Standing right hip abduction and right toe taps on target on 4 inch step mod assist for graded weight shift, right LE coordination, and right hip abduction. Up and down 4 steps x 2 trials with bilat rails mod assist, focus on right LE position on steps and anterior left weight shift. Continue to recommend gait and stair negotiation with therapies only due to level of skilled handling patient requires for these activities for safety at this time. Patient and brother verbalized agreement.  Therapy/Group: Individual Therapy  [name] 01/18/2011, 11:59 AM

## 2011-01-18 NOTE — Progress Notes (Signed)
Patient ID: KYRELL RUACHO, male   DOB: August 19, 1955, 56 y.o.   MRN: [id number] Patient ID: LAUREL SMELTZ, male   DOB: Jul 21, 1955, 56 y.o.   MRN: [id number] Patient ID: RUSSELL QUINNEY, male   DOB: [birth year], 56 y.o.   MRN: [id number] Patient ID: JAEVIAN SHEAN, male   DOB: Nov 19, 1955, 56 y.o.   MRN: [id number] Subjective/Complaints  Eating and drinking well Review of Systems  HENT: Negative for congestion.   Respiratory: Negative.   Gastrointestinal: Negative for nausea, abdominal pain and diarrhea.       PEG out  Genitourinary: Negative for frequency and hematuria.       Condom cath  Musculoskeletal: Negative for joint pain.  All other systems reviewed and are negative.  Doing well, occasional hiccups 1/4. I was asked to check his post-trach and post-peg wounds C/o abd distention w/o pain; passing gas ok  Objective: Vital Signs: Blood pressure 152/98, pulse 73, temperature 97.9 F (36.6 C), temperature source Oral, resp. rate 17, height 6' (1.829 m), weight 216 lb 4.3 oz (98.1 kg), SpO2 96.00%. Dg Swallowing Func-no Report  01/16/2011  CLINICAL DATA: assess presence of silent aspiration of thin liquids   FLUOROSCOPY FOR SWALLOWING FUNCTION STUDY:  Fluoroscopy was provided for swallowing function study, which was  administered by a speech pathologist.  Final results and recommendations  from this study are contained within the speech pathology report.     Results for orders placed during the hospital encounter of 12/22/10 (from the past 72 hour(s))  GLUCOSE, CAPILLARY     Status: Abnormal   Collection Time   01/15/11 11:48 AM      Component Value Range Comment   Glucose-Capillary 103 (*) 70 - 99 (mg/dL)    Comment 1 Notify RN     GLUCOSE, CAPILLARY     Status: Abnormal   Collection Time   01/15/11  4:55 PM      Component Value Range Comment   Glucose-Capillary 133 (*) 70 - 99 (mg/dL)    Comment 1 Notify RN     GLUCOSE, CAPILLARY     Status: Abnormal   Collection Time   01/15/11  9:17 PM    Component Value Range Comment   Glucose-Capillary 187 (*) 70 - 99 (mg/dL)    Comment 1 Notify RN     GLUCOSE, CAPILLARY     Status: Abnormal   Collection Time   01/16/11  7:31 AM      Component Value Range Comment   Glucose-Capillary 118 (*) 70 - 99 (mg/dL)    Comment 1 Notify RN     GLUCOSE, CAPILLARY     Status: Abnormal   Collection Time   01/16/11 11:24 AM      Component Value Range Comment   Glucose-Capillary 141 (*) 70 - 99 (mg/dL)    Comment 1 Notify RN     GLUCOSE, CAPILLARY     Status: Abnormal   Collection Time   01/16/11  4:37 PM      Component Value Range Comment   Glucose-Capillary 140 (*) 70 - 99 (mg/dL)    Comment 1 Notify RN     GLUCOSE, CAPILLARY     Status: Abnormal   Collection Time   01/16/11  9:05 PM      Component Value Range Comment   Glucose-Capillary 115 (*) 70 - 99 (mg/dL)    Comment 1 Notify RN     GLUCOSE, CAPILLARY     Status: Normal   Collection Time  01/17/11  7:42 AM      Component Value Range Comment   Glucose-Capillary 78  70 - 99 (mg/dL)    Comment 1 Notify RN     GLUCOSE, CAPILLARY     Status: Normal   Collection Time   01/17/11 11:29 AM      Component Value Range Comment   Glucose-Capillary 99  70 - 99 (mg/dL)    Comment 1 Notify RN     GLUCOSE, CAPILLARY     Status: Abnormal   Collection Time   01/17/11  4:37 PM      Component Value Range Comment   Glucose-Capillary 135 (*) 70 - 99 (mg/dL)    Comment 1 Notify RN     GLUCOSE, CAPILLARY     Status: Abnormal   Collection Time   01/18/11  7:30 AM      Component Value Range Comment   Glucose-Capillary 114 (*) 70 - 99 (mg/dL)    Comment 1 Notify RN         .  Patient sitting comfortably in his Bed. Patient has weak cough  Trach site with granulation tissue at base, stoma is closed. He is fair oral motor control. No gross cranial nerve abnormalities I can see on exam today. Patient was able to follow simple one-step commands. He communicated with simple yes no and shakes.  He had diminished fine  motor movement in all 4 limbs. He may have had some underlying ataxia on the right  Strength was grossly3+/5 proximally in both upper limbs. Lower extremity grossly3/5 proximal to3/5 distally. Patient did have sensation to gross pain stimulation. Reflexes are 1+ grossly throughout. Patient had poor insight and and awareness. Heart was irregularly irregular. Chest was clear. Abdomen soft nontender. PEG site was clean and intact with no drainage. Skin throughout was generally intact. Trace extremity edema. All 4 limbs were warm with 2+ pulses. Right wrist no tenderness to palp or with ROM R foot swelling no erythema, mild pain with ROM PEG site dry and scabbed over already.  Trach site with hypergranulation and some d/c. Abdomen is big, round, NT. No ascitis by palpation and percussion. BS+ Exam 1/4  Assessment/Plan: 1. Functional deficits secondary to R cerebellar ICH severe R UE and R LE ataxia which require 3+ hours per day of interdisciplinary therapy in a comprehensive inpatient rehab setting.  Team conf today Physiatrist is providing close team supervision and 24 hour management of active medical problems listed below. Physiatrist and rehab team continue to assess barriers to discharge/monitor patient progress toward functional and medical goals. Mobility: Bed Mobility Bed Mobility: Yes Rolling Right: 2: Max assist;With rail Rolling Left: 3: Mod assist Right Sidelying to Sit: With rails;2: Max assist Sitting - Scoot to Edge of Bed: 1: +1 Total assist;Other (comment) (significant posterior right lean) Transfers Sit to Stand: 1: +1 Total assist Sit to Stand Details (indicate cue type and reason): significant posterior right lean, slow to initiate movement Stand to Sit: 1: +1 Total assist Stand Pivot Transfer Details (indicate cue type and reason): poor LE eccentric control with uncontrolled descent, significant posterior right lean Squat Pivot Transfers: 1: +2 Total assist Squat Pivot  Transfer Details (indicate cue type and reason): total assist + 2 (pt = 50%), poor graded movement and weight shift, slow to initiate with LEs Ambulation/Gait Ambulation/Gait Assistance: 2: Max assist Stairs: No (pre-gait level at this time, unsafe to attempt) Naval architect Mobility: No  ADL:    Cognition: Cognition Overall Cognitive Status:  Impaired Arousal/Alertness: Awake/alert Orientation Level: Oriented to person;Oriented to place;Oriented to time Attention: Selective Sustained Attention: Impaired Sustained Attention Impairment: Functional basic Selective Attention: Impaired Selective Attention Impairment: Functional basic Memory: Impaired Memory Impairment: Decreased recall of new information;Decreased long term memory Decreased Long Term Memory: Verbal complex Awareness: Impaired Awareness Impairment: Emergent impairment Problem Solving: Impaired Problem Solving Impairment: Verbal basic;Functional basic Executive Function: Self Monitoring;Self Correcting;Initiating Initiating: Impaired Initiating Impairment: Functional basic;Verbal basic (minimal cues) Self Monitoring: Impaired Self Monitoring Impairment: Functional basic Self Correcting: Impaired Self Correcting Impairment: Verbal basic;Functional basic Safety/Judgment: Appears intact Cognition Arousal/Alertness: Awake/alert Orientation Level: Oriented to person;Oriented to place;Oriented to time  2. Anticoagulation/DVT prophylaxis with Pharmaceutical:no anticoag use SCDs 3. Pain Management:monitor, tylenol for non-specific right foot pain. Patient Active Hospital Problem List: 4.  Afib cannot use warfarin due to ICH resume baby ASA Diabetes mellitus (11/19/2010)    POA: Yes 5.  LUE edema improving 6.   DM Lantus monitor CBG--good control at present. No change in regimen. 7. Dysphagia nectar liquids plus H2O protocol. Eating well.    8.Gout (11/20/2010) -allopurinol currently.  Off  colchicine 9..  Hypo K monitor no diuretics. Recheck cont mild low increase KCL       10.  HTN controlled suboptimally. Increase Cardura 27    11. PEG tube removed today without difficulty 4x4 dressing applied.  May resume meals at lunch today.  -needs silver nitrate to trach site  12. Trach site wound - healing. Wil use silvadine for dressings  13. Abd distension. Poss chronic ileus. Xray abd 1 view. Dulcolax supp today if xray OK    Alex Amra Shukla 01/18/2011, 9:05 AM

## 2011-01-18 NOTE — Progress Notes (Signed)
Physical Therapy Discharge Summary  Patient Details  Name: [name] MRN: [id number] Date of Birth: 10-20-1955 Today's Date: 01/18/2011  Patient has made significant progress with PT during CIR stay, from total assist W/C level at time of PT eval to current supervision for W/C mobility, min assist transfers, min assist dynamic standing balance, mod assist gait with Carley Hammed walker, and mod assist stair negotiation. Due to level of skilled handling required, gait and stair negotiation will occur with therapies only at d/c from CIR setting. Patient and family are in agreement with this plan.  Patient has met all long term goals due to improved activity tolerance, improved balance, improved postural control, increased strength, ability to compensate for deficits, functional use of  right upper extremity, right lower extremity, left upper extremity and left lower extremity, improved attention, improved awareness and improved coordination.  Patient to discharge at a wheelchair level with 24/7  Min Assist.   Patient's care partner is independent to provide the necessary physical assistance at discharge.  Recommendation:  Patient will benefit from ongoing skilled PT services in home health setting to continue to advance safe functional mobility, address ongoing impairments in balance and coordination , and minimize fall risk for further increased functional independence.  Equipment: Equipment provided: W/C, cushion  Patient/family agrees with progress made and goals achieved: Yes  [name] 01/18/2011, 4:29 PM

## 2011-01-18 NOTE — Progress Notes (Addendum)
Occupational Therapy Discharge Summary  Patient Details  Name: EZRAH PANNING MRN: [id number] Date of Birth: 1955-07-16 Today's Date: 01/18/2011 Pain:  None Time:  1400-1530  (90 minutes) Individual treatment/ discharge   Patient has made significant progress with OT during CIR stay, from total assist W/C level to current supervision for W/C mobility, min assist transfers, min assist dynamic standing balance, min assist with BADL.  Provided education to pt and brother regarding ADL transfers and handout for coordination, transfers, standing balance right hand strength.  Pt/brother verbalized and demonstrated understanding.    . Patient to discharge at a wheelchair level with 24/7 Min Assist. Patient's brother is independent to provide the necessary physical assistance at discharge.    Patient has met 8 of 8 long term goals due to improved activity tolerance, improved balance, postural control, functional use of  RIGHT upper extremity, improved attention, improved awareness and improved coordination.  Patient's care partner is independent to provide the necessary physical assistance at discharge.    Recommendation:  Patient will continue to received OT services in a home health setting to continue to advance functional skills in the area of ADL and coordinataion, balance .  Equipment: Equipment provided: tub transfer bench, 3n1 commode chair, wc  Patient/family agrees with progress made and goals achieved: Yes  Humberto Seals 01/18/2011, 4:48 PM

## 2011-01-18 NOTE — Progress Notes (Signed)
Physical Therapy Session Note  Patient Details  Name: [name] MRN: [id number] Date of Birth: [birth year]  Today's Date: 01/18/2011 Time: 1300-1400 Time Calculation (min): 60 min  Precautions: Precautions Precautions: Fall Precaution Comments: monitor O2 during activity Required Braces or Orthoses: No Restrictions Weight Bearing Restrictions: No Other Position/Activity Restrictions: posterior lean  Short Term Goals: PT Short Term Goal 1: Patient will perform bed mobility and supine-sit with supervision. PT Short Term Goal 1 - Progress: Met PT Short Term Goal 2: Patient will transfer with min assist 3 of 5 trials. PT Short Term Goal 2 - Progress: Met PT Short Term Goal 3: Patient will maintain dynamic standing balance with min assist > 5 min for ADL task. PT Short Term Goal 3 - Progress: Met PT Short Term Goal 4: Patient will gait 15 feet with max assist. PT Short Term Goal 4 - Progress: Met PT Short Term Goal 5: Patient will propel W/C > 100 feet with min assist. PT Short Term Goal 5 - Progress: Met  Skilled Therapeutic Interventions/Progress Updates:     General Chart Reviewed: Yes Family/Caregiver Present: Yes (brother)   Pain Pain Assessment Pain Assessment: No/denies pain Pain Score: 0-No pain   Other Treatments  Patient's brother present for session this afternoon. Patient and brother performed all transfers using squat-pivot technique during session without PT assist. Family education complete at this time. Sit to squat position with lateral weight shifts left and right, focus on graded and controlled right lateral weight shift mod assist, progressing to standing lateral weight shifts and standing mini squats in midline, focus on symmetrical weight bearing and graded movement. Gait with Carley Hammed walker x 50 feet mod assist, improved trunk control noted, focus on right LE position to increase BOS, manual facilitation to increase anterior left weight shift and progress  center of gravity over BOS to prevent posterior lean. Gait trial with RW following x 35 feet mod-max assist, manual facilitation at trunk to grade lateral weight shifts with continued manual facilitation for anterior right weight shift. Patient pleased with progress at end of session. Discussed gait only with therapies at home due to continued need of high level skilled handling for safety and facilitation. Patient and brother verbalized agreement with this plan. Discharge re-assessment completed. Please see discharge summary for details.  Therapy/Group: Individual Therapy  [name] 01/18/2011, 3:17 PM

## 2011-01-18 NOTE — Progress Notes (Signed)
Patient's abdomen distended, denies pain. md aware with order for DG of abdomen at bedside. Dressing changed to neack and abdomen area. Squat pivot for transfers. Participating in therapies well. Needs attended.

## 2011-01-19 LAB — GLUCOSE, CAPILLARY
Glucose-Capillary: 220 mg/dL — ABNORMAL HIGH (ref 70–99)
Glucose-Capillary: 112 mg/dL — ABNORMAL HIGH (ref 70–99)
Glucose-Capillary: 106 mg/dL — ABNORMAL HIGH (ref 70–99)

## 2011-01-19 LAB — BASIC METABOLIC PANEL
CO2: 26 mEq/L (ref 19–32)
Chloride: 103 mEq/L (ref 96–112)
Glucose, Bld: 123 mg/dL — ABNORMAL HIGH (ref 70–99)
Potassium: 3.7 mEq/L (ref 3.5–5.1)
Sodium: 140 mEq/L (ref 135–145)

## 2011-01-19 MED ORDER — ISOSORB DINITRATE-HYDRALAZINE 20-37.5 MG PO TABS: 1.0000 | ORAL_TABLET | Freq: Three times a day (TID) | ORAL | Status: DC

## 2011-01-19 MED ORDER — DILTIAZEM HCL ER COATED BEADS 180 MG PO TB24
360.0000 mg | ORAL_TABLET | Freq: Every day | ORAL | Status: DC
  Administered 2011-01-19: 360 mg via ORAL
  Filled 2011-01-19: qty 2

## 2011-01-19 MED ORDER — CALCIUM POLYCARBOPHIL 625 MG PO TABS
625.0000 mg | ORAL_TABLET | Freq: Two times a day (BID) | ORAL | Status: DC
  Filled 2011-01-19 (×2): qty 1

## 2011-01-19 MED ORDER — SIMETHICONE 80 MG PO CHEW: 80.0000 mg | CHEWABLE_TABLET | Freq: Four times a day (QID) | ORAL | Status: AC

## 2011-01-19 MED ORDER — ALLOPURINOL 100 MG PO TABS: 100.0000 mg | ORAL_TABLET | Freq: Every day | ORAL | Status: DC

## 2011-01-19 MED ORDER — RAMIPRIL 10 MG PO CAPS: 10.0000 mg | ORAL_CAPSULE | Freq: Two times a day (BID) | ORAL | Status: DC

## 2011-01-19 MED ORDER — POTASSIUM CHLORIDE CRYS ER 15 MEQ PO TBCR: 30.0000 meq | EXTENDED_RELEASE_TABLET | Freq: Two times a day (BID) | ORAL | Status: DC

## 2011-01-19 MED ORDER — DOXAZOSIN MESYLATE 2 MG PO TABS: 2.0000 mg | ORAL_TABLET | Freq: Every day | ORAL | Status: DC

## 2011-01-19 MED ORDER — BISACODYL 10 MG RE SUPP
10.0000 mg | Freq: Once | RECTAL | Status: AC
  Administered 2011-01-19: 10 mg via RECTAL
  Filled 2011-01-19: qty 1

## 2011-01-19 MED ORDER — ROSUVASTATIN CALCIUM 5 MG PO TABS: 5.0000 mg | ORAL_TABLET | Freq: Every day | ORAL | Status: DC

## 2011-01-19 MED ORDER — DILTIAZEM HCL ER COATED BEADS 180 MG PO TB24: 360.0000 mg | ORAL_TABLET | Freq: Every day | ORAL | Status: DC

## 2011-01-19 MED ORDER — INSULIN GLARGINE 100 UNIT/ML ~~LOC~~ SOLN: 15.0000 [IU] | Freq: Two times a day (BID) | SUBCUTANEOUS | Status: DC

## 2011-01-19 MED ORDER — ASPIRIN 81 MG PO CHEW: 81.0000 mg | CHEWABLE_TABLET | Freq: Every day | ORAL | Status: AC

## 2011-01-19 MED ORDER — BACLOFEN 10 MG PO TABS: 5.0000 mg | ORAL_TABLET | Freq: Three times a day (TID) | ORAL | Status: DC

## 2011-01-19 MED ORDER — PANTOPRAZOLE SODIUM 40 MG PO TBEC: 40.0000 mg | DELAYED_RELEASE_TABLET | Freq: Every day | ORAL | Status: DC

## 2011-01-19 MED ORDER — CALCIUM POLYCARBOPHIL 625 MG PO TABS: 625.0000 mg | ORAL_TABLET | Freq: Two times a day (BID) | ORAL | Status: AC

## 2011-01-19 MED ORDER — CARVEDILOL 25 MG PO TABS: 25.0000 mg | ORAL_TABLET | Freq: Two times a day (BID) | ORAL | Status: DC

## 2011-01-19 NOTE — Progress Notes (Signed)
Social Work Discharge Note Discharge Note  The overall goal for the admission was met for:   Discharge location: Yes- HOME WITH [name] AND OTHER FAMILY TO ASSIST  Length of Stay: Yes-27 DAYS  Discharge activity level: Yes- MINIMAL ASSIST  Home/community participation: Yes  Services provided included: MD, RD, PT, OT, SLP, RN, CM, TR, Pharmacy and SW  Financial Services: Private Insurance: [facility]  Follow-up services arranged: Outpatient: [facility]  JAN 9 1:00-4:00 PT, OT ,SPT, DME: [facility], [facility] and Patient/Family has no preference for HH/DME agencies  Comments (or additional information): PT TO FOLLOW UP WITH MD HERE THAN TRANSITION TO MD IN [place]  Patient/Family verbalized understanding of follow-up arrangements: Yes  Individual responsible for coordination of the follow-up plan: [name]-BROTHER  Confirmed correct DME delivered: Lucy Chris 01/19/2011    Lucy Chris

## 2011-01-19 NOTE — Progress Notes (Signed)
Patient ID: [name], male   DOB: Nov 07, 1955, 56 y.o.   MRN: [id number] Subjective/Complaints  Eating and drinking well Review of Systems  HENT: Negative for congestion.        Trach removed  Respiratory: Negative.   Gastrointestinal: Negative for nausea, abdominal pain. Incontinence with activity per brother.       PEG out Genitourinary: Negative for frequency and hematuria.       Condom cath  Musculoskeletal: Negative for joint pain.  All other systems reviewed and are negative.    Objective: Vital Signs: Blood pressure 144/88, pulse 69, temperature 98.4 F (36.9 C), temperature source Axillary, resp. rate 18, height 6' (1.829 m), weight 98 kg (216 lb 0.8 oz), SpO2 98.00%. Dg Abd Portable 1v  01/18/2011  *RADIOLOGY REPORT*  Clinical Data: Distended abdomen  PORTABLE ABDOMEN - 1 VIEW  Comparison: 12/23/2010  Findings: Nonobstructive bowel gas pattern.  Residual contrast in the colon.  Visualized osseous structures are within normal limits.  IMPRESSION: No evidence of bowel obstruction.  Original Report Authenticated By: Charline Bills, M.D.   Results for orders placed during the hospital encounter of 12/22/10 (from the past 72 hour(s))  GLUCOSE, CAPILLARY     Status: Abnormal   Collection Time   01/16/11 11:24 AM      Component Value Range Comment   Glucose-Capillary 141 (*) 70 - 99 (mg/dL)    Comment 1 Notify RN     GLUCOSE, CAPILLARY     Status: Abnormal   Collection Time   01/16/11  4:37 PM      Component Value Range Comment   Glucose-Capillary 140 (*) 70 - 99 (mg/dL)    Comment 1 Notify RN     GLUCOSE, CAPILLARY     Status: Abnormal   Collection Time   01/16/11  9:05 PM      Component Value Range Comment   Glucose-Capillary 115 (*) 70 - 99 (mg/dL)    Comment 1 Notify RN     GLUCOSE, CAPILLARY     Status: Normal   Collection Time   01/17/11  7:42 AM      Component Value Range Comment   Glucose-Capillary 78  70 - 99 (mg/dL)    Comment 1 Notify RN     GLUCOSE, CAPILLARY      Status: Normal   Collection Time   01/17/11 11:29 AM      Component Value Range Comment   Glucose-Capillary 99  70 - 99 (mg/dL)    Comment 1 Notify RN     GLUCOSE, CAPILLARY     Status: Abnormal   Collection Time   01/17/11  4:37 PM      Component Value Range Comment   Glucose-Capillary 135 (*) 70 - 99 (mg/dL)    Comment 1 Notify RN     GLUCOSE, CAPILLARY     Status: Abnormal   Collection Time   01/18/11  7:30 AM      Component Value Range Comment   Glucose-Capillary 114 (*) 70 - 99 (mg/dL)    Comment 1 Notify RN     GLUCOSE, CAPILLARY     Status: Abnormal   Collection Time   01/18/11 11:35 AM      Component Value Range Comment   Glucose-Capillary 104 (*) 70 - 99 (mg/dL)    Comment 1 Notify RN     GLUCOSE, CAPILLARY     Status: Abnormal   Collection Time   01/18/11  9:06 PM      Component  Value Range Comment   Glucose-Capillary 151 (*) 70 - 99 (mg/dL)    Comment 1 Notify RN     GLUCOSE, CAPILLARY     Status: Abnormal   Collection Time   01/19/11  7:20 AM      Component Value Range Comment   Glucose-Capillary 125 (*) 70 - 99 (mg/dL)    Comment 1 Notify RN         .  Patient sitting comfortably in his Bed. Patient has weak cough  Trach site with granulation tissue at base, stoma is closed. He is fair oral motor control. No gross cranial nerve abnormalities I can see on exam today. Patient was able to follow simple one-step commands. He communicated with simple yes no and shakes.  He had diminished fine motor movement in all 4 limbs. He may have had some underlying ataxia on the right Patient did have sensation to gross pain stimulation. Reflexes are 1+ grossly throughout. Patient had poor insight and and awareness.  Heart was irregularly irregular.  Chest was clear.  Abdomen round,distended, non tender, positive bowel sounds.  Skin throughout was generally intact.  Extremities: Trace extremity edema. All 4 limbs were warm with 2+ pulses. Right wrist no tenderness to palp or with  ROM R foot swelling no erythema, mild pain with ROM  Assessment/Plan: 1. Functional deficits secondary to R cerebellar ICH severe R UE and R LE ataxia stable for D/C  No PMR f/u due to being 3 hrs away Mobility: Bed Mobility Bed Mobility: Yes Rolling Right: 5: Supervision Rolling Left: 5: Supervision Right Sidelying to Sit: 5: Supervision Supine to Sit: 5: Supervision Sitting - Scoot to Edge of Bed: 5: Supervision Transfers Transfers: Yes Sit to Stand: 4: Min assist Sit to Stand Details (indicate cue type and reason): significant posterior right lean, slow to initiate movement Stand to Sit: 4: Min assist Stand Pivot Transfers: 4: Min assist Stand Pivot Transfer Details (indicate cue type and reason): poor LE eccentric control with uncontrolled descent, significant posterior right lean Squat Pivot Transfers: 4: Min assist Squat Pivot Transfer Details (indicate cue type and reason): total assist + 2 (pt = 50%), poor graded movement and weight shift, slow to initiate with LEs Ambulation/Gait Ambulation/Gait Assistance: 3: Mod assist Ambulation/Gait Assistance Details (indicate cue type and reason): with Carley Hammed walker x 50 feet with therapies only Stairs: No (pre-gait level at this time, unsafe to attempt) Corporate treasurer: Yes Wheelchair Assistance: 5: Supervision Wheelchair Propulsion: Both upper extremities;Both lower extermities Wheelchair Parts Management: Supervision/cueing Distance: > 200 feet  ADL:    Cognition: Cognition Overall Cognitive Status: Impaired at baseline Arousal/Alertness: Awake/alert Orientation Level: Oriented X4 Attention: Selective Sustained Attention: Impaired Sustained Attention Impairment: Functional basic Selective Attention: Impaired Selective Attention Impairment: Functional basic Memory: Impaired Memory Impairment: Decreased recall of new information;Decreased long term memory Decreased Long Term Memory: Verbal  complex Awareness: Impaired Awareness Impairment: Emergent impairment Problem Solving: Impaired Problem Solving Impairment: Verbal basic;Functional basic Executive Function: Self Monitoring;Self Correcting;Initiating Initiating: Impaired Initiating Impairment: Functional basic;Verbal basic (minimal cues) Self Monitoring: Impaired Self Monitoring Impairment: Functional basic Self Correcting: Impaired Self Correcting Impairment: Verbal basic;Functional basic Safety/Judgment: Appears intact Cognition Arousal/Alertness: Awake/alert Orientation Level: Oriented X4  2. Anticoagulation/DVT prophylaxis with Pharmaceutical:no anticoag use SCDs 3. Pain Management:monitor, tylenol for non-specific right foot pain. Patient Active Hospital Problem List: 4.  Afib cannot use warfarin due to ICH resume baby ASA 5. Diabetes mellitus (11/19/2010)   Lantus monitor CBG--good control at present. No change in  regimen. 6.. Dysphagia ;  Advanced to regular with thin liquids.  No problems with aspiration (even with hiccups) 7. Gout (11/20/2010) -may be behind some of foot pain from yesterday.  Foot not tender on exam. Urate level 8 levated pt  on allopurinol.  Right wrist feeling better today.     8. Hypokalemia: ? Due to hydralazine.  Recheck today prior to discharge. 9.  HTN: blood pressure borderline but will leave medication at current rate.  Follow up with cardiology for adjustment past discharge. 10.  Bowel incontinence: will increase fibercon to help with bulking.  Suppository prior to discharge.  KUB without obstruction.   11. Hiccups: none in the last 24 hours.   [address] 01/19/2011, 7:57 AM

## 2011-01-19 NOTE — Progress Notes (Signed)
[facility] Discharge Summary Patient Details    Name: [name] MRN: [id number] Date of Birth: 05-24-1955  Long term goals set: 1  Long term goals met: 1  Comments on progress toward goals: Pt has made good progress toward goal meeting Min assist level for seated TR tasks.  Pt requires occasional min assist for decreased coordination of UE's during tabletop tasks.  Pt continues to require Min cues during tasks.  Pt ready for d/c home with brother to coordinate 24 hour care.  Reasons for discharge: discharge from hospital  Patient/family agrees with progress made and goals achieved: Yes  Janella Rogala 01/19/2011, 8:47 AM

## 2011-01-19 NOTE — Progress Notes (Signed)
Patient ID: [name], male   DOB: January 01, 1956, 56 y.o.   MRN: [id number] Pt discharged to home with brother as PCG. Goals met. Family education complete.

## 2011-01-19 NOTE — Progress Notes (Signed)
Discharge summary [phone]

## 2011-01-20 NOTE — Discharge Summary (Signed)
[name], [name]                ACCOUNT NO.:  [id number]  MEDICAL RECORD NO.:  [id number]  LOCATION:  4030                         FACILITY:  [facility]  PHYSICIAN:  [name], M.D.DATE OF BIRTH:  February 21, 1955  DATE OF ADMISSION:  12/22/2010 DATE OF DISCHARGE:  01/19/2011                              DISCHARGE SUMMARY   DISCHARGE DIAGNOSES: 1. Right hemispheric infarcts, cardioembolic due to atrial     fibrillation with hemorrhagic conversion. 2. Ischemic stroke with recurrent bleed, no anticoagulation. 3. Atrial fibrillation. 4. Hypertension. 5. Diabetes mellitus type 2. 6. Acute blood loss anemia. 7. Hypokalemia. 8. Ventilator-dependent respiratory failure, resolved.  HISTORY OF PRESENT ILLNESS:  [name] is a 56 year old male with history of COPD, DM, AFib, admitted on November 19, 2010 with acute onset of slurred speech with left-sided weakness.  CT of head done showed no acute abnormality.  The patient became diaphoretic with increased WB with inability to speak, left upper extremity flaccidity while in x-ray. He was intubated for airway support.  CTA of chest showed no PE and evidence of CHF.  CTA head and neck showed no thrombus, distal left vertebral artery stenosis, and basilar artery stenosis.  MRI of the brain showed acute/subacute infarcts in right ACA territory including mid brain and upper pons.  The patient was extubated briefly on November 21, 2010, but later that day was noted to have unresponsiveness with followup CT of head showing large right cerebellar infarct with hemorrhagic transformation with edema compressing the 4th ventricle and mild obstructive hydrocephalus.  Dr. Phoebe [name] was consulted for input and recommended reversing heparin and Coumadin.  On November 22, 2010, the patient had ventriculostomy placed with good drainage of CSF.  The patient was comatose and unresponsive.  He required tracheostomy by Dr. Pollyann [name] on November 28, 2010 and  PEG placed on December 03, 2010 by Dr. Juanda [name].  He develop fevers as well as diarrhea and was started on Flagyl with question of C. diff.  Vent wean was initiated, however, on December 06, 2010, the patient with worsening of mental status and repeat CT of head showed slight increase in hemorrhage and question new right parietal infarct and the patient required re-intubation.  Dr. Phoebe [name] was consulted for input and recommended no surgical intervention. Currently, the patient is extubated.  He was noted to have stomal bleeding on December 15, 2010 treated with tracheostomy change and placement of #8 cuffed trach.  The patient also with bloody stools with question of lower GI bleed.  [facility] recommends serial hemoglobin checks for stability.  The patient's blood pressures continued to be labile as well as AFib with elevated heart rate, requiring IV Cardizem. Dr. Sharyn [name] was consulted for input on BP and AFib management.  He recommended adding labetalol as well as ACE inhibitor for better control.  Passy-Muir trials are ongoing, and the patient was downsized with #6 cuffed trach today.  Therapies ongoing and the patient was admitted to rehab for progressive therapies.  PAST MEDICAL HISTORY:  Positive for: 1. DM type 2. 2. Coronary artery disease. 3. Hypertension. 4. Gout. 5. CHF. 6. AFib. 7. Arthritis.  PAST SURGICAL HISTORY:  Negative except for recent trach and  PEG placement.  REVIEW OF SYMPTOMS:  The patient is poor historian given his language and cognitive deficits.  He is denying any chest pain or shortness of breath today.  FAMILY HISTORY:  Negative for any chronic illnesses.  SOCIAL HISTORY:  The patient is single.  Retired from [employer] 5 years ago.  He quit smoking 5 years ago.  He does not use any smokeless tobacco.  Drinks approximately 6.6 ounces of alcohol a week.  Does not use any illicit drugs.  Has a brother in [place] who is retired and is to  assist past discharge.  FUNCTIONAL HISTORY:  The patient was independent with ambulation as well as basic ADLs prior to admission.  He still drives.  FUNCTIONAL STATUS:  The patient is total assist 50% for bed mobility. Mod assist to sit at the edge of bed, +2 total assist 45%-50% for stand- pivot transfers from elevated surfaces with manual assist for forward weight shifting.  ADLs, the patient requires close supervision for grooming.  Total assist for upper body bathing, max assist for upper body dressing.  He is total assist for lower body dressing.  Total assist for toileting.  PHYSICAL EXAMINATION:  VITAL SIGNS:  Blood pressure 120/98, pulse 86, temperature 98.4, respiratory rate 22. GENERAL:  The patient is a well-nourished, well-developed male, up in chair, comfortable, in no acute distress. HEENT:  The patient with thrush on tongue.  Fair dentition.  Fair oral motor control. NECK:  No masses.  Trach in place. LUNGS:  Upper airway rhonchi.  No wheezes noted. HEART:  Irregularly irregular rhythm.  No murmurs or gallops. ABDOMEN:  Round, nontender with positive bowel sounds.  PEG site clean and dry. SKIN:  Intact without breakdown. EXTREMITIES:  Mild edema in all 4 limbs at 1+ and nonpitting.  2+ pulses in all and limbs warm to touch. NEUROLOGIC:  The patient is alert.  He is nonverbal.  No gross cranial nerve abnormalities.  He is able to follow simple one-step commands and able to communicate with nods and shakes for simple yes/no questions. He has diminished fine motor movement in all 4 limbs, although may have some underlying ataxia on right side, difficult to discern due to his extensive weakness throughout.  Strength is grossly 2+ to 3/5 proximally in both upper limbs.  Lower extremity strength is 1 to 2/5 proximally, 2/5 distally.  The patient does have sensation to gross pain stimulation.  Reflexes 1+.  The patient with poor insight and awareness. Memory is difficult  to assess due to his language issues, but appears to be impaired.  HOSPITAL COURSE:  [name] was admitted to rehab on December 22, 2010 for inpatient therapies to consist of PT, OT, and speech therapy at least 3 hours 5 days a week.  Past admission, physiatrist, rehab, RN, and therapy team have worked together to provide customized collaborative interdisciplinary care.  Rehab RN has worked with the patient on bowel and bladder management as well as skin care and medication administration.  The patient was initially n.p.o. with tube feeds ongoing.  CBGs were checked on q.4 h. basis while tube feeds ongoing. As diet initiated, the patient was changed over to a.c. and at bedtime CBG checks and maintained on Lantus insulin on a b.i.d. basis.  The patient's blood sugars are currently well controlled ranging from 110s to occasional high in 150s.  Blood pressures have been checked on b.i.d. basis.  BP meds were titrated for tighter blood pressure control. Currently, systolic  blood pressures are ranging from 140s-180s, diastolics in 80s-90s.  Heart rate has been stable in 60s-80s range. The patient has had issues with hypokalemia requiring potassium supplementation.  He continues on K-Dur 30 mEq b.i.d.  Check of lytes at the time of discharge revealed sodium 140, potassium 3.7, chloride 103, CO2 of 26, BUN 11, creatinine 0.97, glucose 123.  His H and H has been monitored along.  Last check of CBC of December 29, 2010 reveals hemoglobin 8.8, hematocrit 27.6, white count 7.4, platelets 378.  The patient has had issues with bowel and bladder incontinence, despite scheduled toileting.  He was started on FiberCon to help with bulking. This was increased to b.i.d. basis at the time of discharge.  The patient's family has been advised by toileting the patient after meals to prevent bowel incontinence.  Currently, the patient has been advanced to regular diet, thin liquids, and he is tolerating  it without difficulty.  PEG tube was discontinued on January 15, 2011 without difficulty.  The patient was decannulated without difficulty and has not had any desaturations.  His prior tracheal stoma has healed well.  He was noted to have some hypergranulation at this site and this has been treated with silver nitrate.  The prior PEG site is healing well without any signs or symptoms of infection.  The patient did have gout flare affecting his right foot and wrist.  He was treated with short course of colchicine with resolution of his symptoms.  The patient was noted to have abdominal distention on January 18, 2011.  KUB done showed nonobstructive bowel gas pattern.  The patient to continue on simethicone for bloating symptoms.  During the patient's stay in rehab, weekly team conferences were held to monitor the patient's progress, set goals, as well as discuss barriers to discharge.  At the time of admission, the patient was requiring total assist for basic self-care needs due to abnormal tone, ataxia, decreased motor planning, as well as decreased visual motor skills, decrease initiation.  He was noted to have decreased awareness with poor problem solving and delayed processing.  PT evaluation revealed the patient with decreased postural control and unsupported sitting with strong posterior right lean and extensor thrust with decreased guarded movement in all planes, although no movement with increased time for initiation. Decrease in bilateral lower extremities eccentric control, left greater than right, as well as problems with attention processing and motor planning.  Speech therapy evaluation was initiated and Passy-Muir valve was used to help facilitate communication.  Speech Therapy has worked with the patient on improving sustained attention, speech intelligibility, improving memory with utilization of memory aids for a day to day task.  They have also worked on his dysphagia  treatment and speech intelligibility with cues to increase vocal intensity.  By the time of discharge, the patient had been decannulated.  Speech intelligibility is greatly improved.  The patient is tolerating regular diet, thin liquids without any signs or symptoms of aspiration.  He, Continues to require mod semantic cues to adhere to his aspiration precautions.  Full supervision at meals is recommended to help with recall of his aspiration precautions.  The patient's overall cognition has improved to supervision to min assist with cues to initiate indirect care as well as recall information and daily events.  Due to visual deficits, use of written compensatory aids has been limited.  However, it is recommended once the patient has access to his contact lenses or Glasses, this task could be attempted at  next level of care.  The patient has progressed in his ADL tasks to being at min assist with min instructional cues for bathing, dressing, and toileting.  The patient has made good progress in his activity tolerance with improved balance, improved postural control, and increased strength with ability to compensate for deficits.  He is also showing improvement in functional use of his upper and lower extremities with improved attention, improved awareness, and improved coordination.  The patient is at min assist for squat-pivot transfers.  He is supervision for wheelchair navigation.  He is at mod assist to ambulate 50 feet in [place] walker as well as mod assist required for stair navigation.  Due to level of skilled handling required gait and chair navigation, is only to occur with therapist past discharge.  The patient and family are in agreement with this plan.  The patient will require 24 hours assistance past discharge, and the patient's brother is to provide this.  Rehab RN has educated the patient and his family regarding carb-modified diet, insulin management, as well as CBG  monitoring.  Family was advised on finding a primary physician closer to home.  However, as this has not been done, the patient has been set up for followup with Dr. Sharyn [name] past discharge.  They will make a  decision about finding a primary care physician closer to home in the near future.  DISCHARGE MEDICATIONS: 1. Allopurinol 100 mg p.o. per day. 2. Aspirin 81 mg p.o. per day. 3. Baclofen 10 mg half p.o. t.i.d. to prevent hiccups.  In a few days,     can try to taper to b.i.d. if hiccups do not recur and decrease to     once a day as tolerated and taper off.  If hiccups recur, to go     back up to t.i.d. basis. 4. Cardizem LA 180 mg 2 tablets p.o. per day. 5. Cardura 1 tablet p.o. at bedtime. 6. Protonix 40 mg p.o. per day. 7. FiberCon 1 tablet p.o. b.i.d. 8. Klor-Con 50 mg 2 tablets p.o. b.i.d. 9. Simethicone 80 mg a.c. and at bedtime. 10.Lantus insulin 15 units subcu b.i.d. with meals. 11.Coreg 25 mg p.o. b.i.d. 12.BiDil 20/37.5 mg 1 p.o. t.i.d. 13.Altace 10 mg p.o. b.i.d. 14.Crestor 5 mg p.o. at bedtime.  DIET:  Carb modified, medium.  ACTIVITY LEVEL:  At 24 hours supervision and assistance.  SPECIAL INSTRUCTIONS:  Keep the prior PEG and trach sites clean and dry. Walking to be done with therapists only.  Check blood sugars b.i.d. to t.i.d. and record.  Toilet patient past meals.  Bathe, dress with assistance.  Outpatient PT, OT, and speech therapy to begin at [facility] on January 21, 2011, 1 p.m. to 4 p.m.  FOLLOWUP:  The patient to follow up with Dr. Wynn Banker on February 10, 2011 at 9:45 to 10:15 appointment.  Follow up with Dr. Rinaldo Cloud on January 28, 2011 at 2:45 p.m.  Follow up with Dr. Pearlean Brownie in the next few weeks.     Delle Reining, P.A.   ______________________________ [name], M.D.    PL/MEDQ  D:  01/19/2011  T:  01/20/2011  Job:  [id number]  cc:   Pramod P. Pearlean Brownie, MD Eduardo Osier [name], M.D.

## 2011-02-10 ENCOUNTER — Inpatient Hospital Stay: Payer: 59 | Admitting: Physical Medicine & Rehabilitation

## 2011-02-10 ENCOUNTER — Encounter: Payer: 59 | Attending: Physical Medicine & Rehabilitation

## 2011-03-27 ENCOUNTER — Encounter: Payer: Self-pay | Admitting: Physical Medicine & Rehabilitation

## 2011-04-07 ENCOUNTER — Encounter: Payer: Self-pay | Admitting: Physical Medicine & Rehabilitation

## 2011-04-14 ENCOUNTER — Encounter: Payer: Self-pay | Admitting: Physical Medicine & Rehabilitation

## 2012-03-31 ENCOUNTER — Emergency Department (HOSPITAL_COMMUNITY)
Admission: EM | Admit: 2012-03-31 | Discharge: 2012-03-31 | Disposition: A | Discharge status: Home / Self Care | Payer: Self-pay | Source: Home / Self Care

## 2012-03-31 ENCOUNTER — Encounter (HOSPITAL_COMMUNITY): Payer: Self-pay

## 2012-03-31 DIAGNOSIS — I619 Nontraumatic intracerebral hemorrhage, unspecified: Secondary | ICD-10-CM

## 2012-03-31 MED ORDER — ALLOPURINOL 100 MG PO TABS: 100.0000 mg | ORAL_TABLET | Freq: Every day | ORAL | Status: DC

## 2012-03-31 MED ORDER — PANTOPRAZOLE SODIUM 40 MG PO TBEC: 40.0000 mg | DELAYED_RELEASE_TABLET | Freq: Every day | ORAL | Status: DC

## 2012-03-31 MED ORDER — ISOSORB DINITRATE-HYDRALAZINE 20-37.5 MG PO TABS: 1.0000 | ORAL_TABLET | Freq: Three times a day (TID) | ORAL | Status: DC

## 2012-03-31 MED ORDER — ROSUVASTATIN CALCIUM 5 MG PO TABS: 5.0000 mg | ORAL_TABLET | Freq: Every day | ORAL | Status: DC

## 2012-03-31 MED ORDER — FREESTYLE SYSTEM KIT: 1.0000 | PACK | Freq: Three times a day (TID) | Status: DC

## 2012-03-31 MED ORDER — SPIRONOLACTONE 25 MG PO TABS: 25.0000 mg | ORAL_TABLET | Freq: Every day | ORAL | Status: DC

## 2012-03-31 MED ORDER — RAMIPRIL 10 MG PO CAPS: 10.0000 mg | ORAL_CAPSULE | Freq: Two times a day (BID) | ORAL | Status: DC

## 2012-03-31 MED ORDER — DILTIAZEM HCL ER COATED BEADS 180 MG PO TB24: 360.0000 mg | ORAL_TABLET | Freq: Every day | ORAL | Status: DC

## 2012-03-31 MED ORDER — DOXAZOSIN MESYLATE 2 MG PO TABS: 2.0000 mg | ORAL_TABLET | Freq: Every day | ORAL | Status: DC

## 2012-03-31 MED ORDER — CARVEDILOL 25 MG PO TABS: 25.0000 mg | ORAL_TABLET | Freq: Two times a day (BID) | ORAL | Status: DC

## 2012-03-31 NOTE — ED Notes (Signed)
Patient here to establish care Has a history of DM HTN and cholesterol

## 2012-03-31 NOTE — ED Notes (Signed)
Patient Demographics  [name], is a 57 y.o. male  ZOX:[id number]  [id number]:[id number]  DOB - [birth year]  Chief Complaint  Patient presents with  . Diabetes        Subjective:   [name] moved to town recently, has history of intracranial bleed in the past affecting left side of his body largely wheelchair-bound and walks with a walker, atrial fibrillation not on Coumadin due to intracranial bleed, type II diabetic, hypertension, dyslipidemia. Here to establish care and to get medication refills. No headache no chest pain no cough phlegm shortness of breath, no dysuria no abdominal pain. No new weakness.  Objective:    Filed Vitals:   03/31/12 1259  BP: 113/77  Pulse: 67  Temp: 97.7 F (36.5 C)  TempSrc: Oral  SpO2: 100%     Exam  Awake Alert, Oriented X 3, No new F.N deficits, Normal affect, left leg strength 4/5, arm strength 5 over 5, 5 over 5 strength on right side. [place].AT,PERRAL Supple Neck,No JVD, No cervical lymphadenopathy appriciated.  Symmetrical Chest wall movement, Good air movement bilaterally, CTAB RRR,No Gallops,Rubs or new Murmurs, No Parasternal Heave +ve B.Sounds, Abd Soft, Non tender, No organomegaly appriciated, No rebound - guarding or rigidity. No Cyanosis, Clubbing or edema, No new Rash or bruise      Data Review   CBC No results found for this basename: WBC, HGB, HCT, PLT, MCV, MCH, MCHC, RDW, NEUTRABS, LYMPHSABS, MONOABS, EOSABS, BASOSABS, BANDABS, BANDSABD,  in the last 168 hours  Chemistries   No results found for this basename: NA, K, CL, CO2, GLUCOSE, BUN, CREATININE, GFRCGP, CALCIUM, MG, AST, ALT, ALKPHOS, BILITOT,  in the last 168 hours ------------------------------------------------------------------------------------------------------------------ No results found for this basename: HGBA1C,  in the last 72 hours ------------------------------------------------------------------------------------------------------------------ No  results found for this basename: CHOL, HDL, LDLCALC, TRIG, CHOLHDL, LDLDIRECT,  in the last 72 hours ------------------------------------------------------------------------------------------------------------------ No results found for this basename: TSH, T4TOTAL, FREET3, T3FREE, THYROIDAB,  in the last 72 hours ------------------------------------------------------------------------------------------------------------------ No results found for this basename: VITAMINB12, FOLATE, FERRITIN, TIBC, IRON, RETICCTPCT,  in the last 72 hours  Coagulation profile  No results found for this basename: INR, PROTIME,  in the last 168 hours     Prior to Admission medications   Medication Sig Start Date End Date Taking? Authorizing Provider  aspirin 81 MG tablet Take 81 mg by mouth daily.   Yes Historical Provider, MD  allopurinol (ZYLOPRIM) 100 MG tablet Take 1 tablet (100 mg total) by mouth daily. 03/31/12   Leroy Sea, MD  baclofen (LIORESAL) 10 MG tablet Take 0.5 tablets (5 mg total) by mouth 3 (three) times daily. 01/19/11   Jacquelynn Cree, PA-C  carvedilol (COREG) 25 MG tablet Place 1 tablet (25 mg total) into feeding tube 2 (two) times daily with a meal. 03/31/12   Leroy Sea, MD  diltiazem (CARDIZEM LA) 180 MG 24 hr tablet Take 2 tablets (360 mg total) by mouth daily at 12 noon. 03/31/12 03/31/13  Leroy Sea, MD  doxazosin (CARDURA) 2 MG tablet Take 1 tablet (2 mg total) by mouth at bedtime. 03/31/12 03/31/13  Leroy Sea, MD  glucose monitoring kit (FREESTYLE) monitoring kit 1 each by Does not apply route 4 (four) times daily - after meals and at bedtime. 1 month Diabetic Testing Supplies for QAC-QHS accuchecks. 03/31/12   Leroy Sea, MD  insulin glargine (LANTUS) 100 UNIT/ML injection Inject 15 Units into the skin 2 (two) times daily. 01/19/11 01/19/12  Evlyn Kanner  Love, PA-C  isosorbide-hydrALAZINE (BIDIL) 20-37.5 MG per tablet Place 1 tablet into feeding tube 3 (three) times daily.  03/31/12   Leroy Sea, MD  pantoprazole (PROTONIX) 40 MG tablet Take 1 tablet (40 mg total) by mouth daily at 12 noon. 03/31/12 03/31/13  Leroy Sea, MD  ramipril (ALTACE) 10 MG capsule Place 1 capsule (10 mg total) into feeding tube 2 (two) times daily. 03/31/12 03/31/13  Leroy Sea, MD  rosuvastatin (CRESTOR) 5 MG tablet Place 1 tablet (5 mg total) into feeding tube daily at 6 PM. 03/31/12 03/31/13  Leroy Sea, MD  spironolactone (ALDACTONE) 25 MG tablet Take 1 tablet (25 mg total) by mouth daily. 03/31/12   Leroy Sea, MD     Assessment & Plan   History of intracranial bleed with left-sided hemiparesis left leg weaker than the arm. Patient used to be on Coumadin per him, that has been discontinued, he's not taking a baby aspirin which will be continued, no new issues, patient physical therapy once he has his Medicare or health insurance reestablished.   Type 2 diabetes mellitus. Patient on Lantus, it will be continued, will check A1c, have provided him with testing supplies and requested him to check himself q. a.c. at bedtime, bring the log book next visit.   Hypertension. Stable we'll refill her medications. Note patient is on Aldactone and ACE inhibitor combination, we'll check BMP prior to his next visit.   History of atrial fibrillation. Goal will be rate controlled, continue Cardizem and Coreg, baby aspirin.   Note patient will come back in a month day prior to his next visit we will check lipid panel, BMP, CBC, TSH, A1c.   Follow-up Information   Follow up with this clinic. Schedule an appointment as soon as possible for a visit in 1 month. (come in a day before for blood work in the am empty stomach)        Leroy Sea M.D on 03/31/2012 at 1:16 PM   Leroy Sea, MD 03/31/12 1321

## 2012-04-04 NOTE — ED Notes (Signed)
[facility]oke with teresea at [facility]. They do no have cardizem la OK per dr Laural Benes to switch to carizem ER 180MG  bid

## 2012-04-07 MED ORDER — PRAVASTATIN SODIUM 20 MG PO TABS: 20.0000 mg | ORAL_TABLET | Freq: Every day | ORAL | Status: DC

## 2012-05-03 ENCOUNTER — Emergency Department (INDEPENDENT_AMBULATORY_CARE_PROVIDER_SITE_OTHER)
Admission: EM | Admit: 2012-05-03 | Discharge: 2012-05-03 | Disposition: A | Discharge status: Home Health | Payer: No Typology Code available for payment source | Source: Home / Self Care

## 2012-05-03 ENCOUNTER — Encounter (HOSPITAL_COMMUNITY): Payer: Self-pay

## 2012-05-03 DIAGNOSIS — E119 Type 2 diabetes mellitus without complications: Secondary | ICD-10-CM

## 2012-05-03 DIAGNOSIS — I619 Nontraumatic intracerebral hemorrhage, unspecified: Secondary | ICD-10-CM

## 2012-05-03 DIAGNOSIS — Z760 Encounter for issue of repeat prescription: Secondary | ICD-10-CM

## 2012-05-03 LAB — COMPREHENSIVE METABOLIC PANEL
AST: 19 U/L (ref 0–37)
Albumin: 4.2 g/dL (ref 3.5–5.2)
CO2: 29 mEq/L (ref 19–32)
Calcium: 10.4 mg/dL (ref 8.4–10.5)
Creatinine, Ser: 1.33 mg/dL (ref 0.50–1.35)
GFR calc non Af Amer: 58 mL/min — ABNORMAL LOW (ref >90)
Sodium: 137 mEq/L (ref 135–145)
Total Protein: 8.6 g/dL — ABNORMAL HIGH (ref 6.0–8.3)

## 2012-05-03 LAB — CBC
MCH: 30.4 pg (ref 26.0–34.0)
MCHC: 35 g/dL (ref 30.0–36.0)
MCV: 87 fL (ref 78.0–100.0)
Platelets: 222 10*3/uL (ref 150–400)
RDW: 13.3 % (ref 11.5–15.5)

## 2012-05-03 LAB — LIPID PANEL
HDL: 67 mg/dL (ref >39)
LDL Cholesterol: 78 mg/dL (ref 0–99)
Total CHOL/HDL Ratio: 2.5 RATIO
Triglycerides: 114 mg/dL (ref <150)

## 2012-05-03 MED ORDER — DOXAZOSIN MESYLATE 2 MG PO TABS: 2.0000 mg | ORAL_TABLET | Freq: Every day | ORAL | Status: DC

## 2012-05-03 MED ORDER — ASPIRIN 81 MG PO TABS: 81.0000 mg | ORAL_TABLET | Freq: Every day | ORAL | Status: DC

## 2012-05-03 MED ORDER — DILTIAZEM HCL ER COATED BEADS 180 MG PO TB24: 360.0000 mg | ORAL_TABLET | Freq: Every day | ORAL | Status: DC

## 2012-05-03 MED ORDER — PRAVASTATIN SODIUM 20 MG PO TABS: 20.0000 mg | ORAL_TABLET | Freq: Every day | ORAL | Status: DC

## 2012-05-03 MED ORDER — SPIRONOLACTONE 25 MG PO TABS: 25.0000 mg | ORAL_TABLET | Freq: Every day | ORAL | Status: DC

## 2012-05-03 MED ORDER — ALLOPURINOL 100 MG PO TABS: 100.0000 mg | ORAL_TABLET | Freq: Every day | ORAL | Status: DC

## 2012-05-03 MED ORDER — CARVEDILOL 25 MG PO TABS: 25.0000 mg | ORAL_TABLET | Freq: Two times a day (BID) | ORAL | Status: DC

## 2012-05-03 MED ORDER — ISOSORB DINITRATE-HYDRALAZINE 20-37.5 MG PO TABS: 1.0000 | ORAL_TABLET | Freq: Three times a day (TID) | ORAL | Status: DC

## 2012-05-03 MED ORDER — RAMIPRIL 10 MG PO CAPS: 10.0000 mg | ORAL_CAPSULE | Freq: Two times a day (BID) | ORAL | Status: DC

## 2012-05-03 MED ORDER — INSULIN GLARGINE 100 UNIT/ML ~~LOC~~ SOLN: 15.0000 [IU] | Freq: Every day | SUBCUTANEOUS | Status: DC

## 2012-05-03 NOTE — Progress Notes (Signed)
57 year old male here for medication refill. He is accompanied by his wife. He is also here to get his blood work done that was ordered on previous visit. Denies any symptoms at this time. Vitals stable Medication had been refilled for 1 month. He only takes Lantus 15 units at bedtime. I will see him in 2 weeks to followup on his labs and adjust medications accordingly.

## 2012-05-03 NOTE — ED Notes (Signed)
Patient here for medication refills  Needs labwork

## 2012-05-17 ENCOUNTER — Emergency Department (INDEPENDENT_AMBULATORY_CARE_PROVIDER_SITE_OTHER)
Admission: EM | Admit: 2012-05-17 | Discharge: 2012-05-17 | Disposition: A | Discharge status: Home / Self Care | Payer: No Typology Code available for payment source | Source: Home / Self Care

## 2012-05-17 ENCOUNTER — Encounter (HOSPITAL_COMMUNITY): Payer: Self-pay

## 2012-05-17 DIAGNOSIS — E119 Type 2 diabetes mellitus without complications: Secondary | ICD-10-CM

## 2012-05-17 DIAGNOSIS — I10 Essential (primary) hypertension: Secondary | ICD-10-CM

## 2012-05-17 MED ORDER — DOXAZOSIN MESYLATE 2 MG PO TABS: 2.0000 mg | ORAL_TABLET | Freq: Every day | ORAL | Status: DC

## 2012-05-17 MED ORDER — SPIRONOLACTONE 25 MG PO TABS: 25.0000 mg | ORAL_TABLET | Freq: Every day | ORAL | Status: DC

## 2012-05-17 MED ORDER — ASPIRIN 81 MG PO TABS: 81.0000 mg | ORAL_TABLET | Freq: Every day | ORAL | Status: DC

## 2012-05-17 MED ORDER — PRAVASTATIN SODIUM 20 MG PO TABS: 20.0000 mg | ORAL_TABLET | Freq: Every day | ORAL | Status: DC

## 2012-05-17 MED ORDER — CARVEDILOL 25 MG PO TABS: 25.0000 mg | ORAL_TABLET | Freq: Two times a day (BID) | ORAL | Status: DC

## 2012-05-17 MED ORDER — RAMIPRIL 10 MG PO CAPS: 10.0000 mg | ORAL_CAPSULE | Freq: Two times a day (BID) | ORAL | Status: DC

## 2012-05-17 MED ORDER — METFORMIN HCL 850 MG PO TABS: 850.0000 mg | ORAL_TABLET | Freq: Two times a day (BID) | ORAL | Status: DC

## 2012-05-17 MED ORDER — ISOSORB DINITRATE-HYDRALAZINE 20-37.5 MG PO TABS: 2.0000 | ORAL_TABLET | Freq: Three times a day (TID) | ORAL | Status: DC

## 2012-05-17 MED ORDER — DILTIAZEM HCL ER COATED BEADS 180 MG PO TB24: 360.0000 mg | ORAL_TABLET | Freq: Every day | ORAL | Status: DC

## 2012-05-17 MED ORDER — ALLOPURINOL 100 MG PO TABS: 100.0000 mg | ORAL_TABLET | Freq: Every day | ORAL | Status: DC

## 2012-05-17 NOTE — ED Notes (Signed)
Patient here to get results of lab work Needs referrals for PT/ot Would like a referral for speech therapy as well

## 2012-05-17 NOTE — ED Provider Notes (Signed)
History     CSN: [id number]  Arrival date & time 05/17/12  1518   First MD Initiated Contact with Patient 05/17/12 1559     Chief complaint Follow up    HPI 57 year old male with history of CVA with residual left-sided weakness and slurred speech ,CAD, hypertension, A. fib, (not on anticoagulation due to recurrent intracranial hemorrhage presented for followup on her recent labs. Patient had lipid panel, TSH, hemoglobin A1c and comprehensive metabolic panel checked. Patient informs me on insulin since hospital discharge in late 2012 but has difficulty quantitating his fingersticks and being regular rate insulin. He requests to switch back to metformin which she took in the past. He also requests to oral pain home health PT, OT, and RN as he has difficulty ambulating and uses a walker at home. He also lives alone and feels weak as he is not able to get any physical therapy. Patient denies any chest pain, palpitations, shortness of breath, headache, dizziness, blurry vision, nausea, vomiting, abdominal pain, bowel or urinary symptoms. Denies any new weakness.      , Past Medical History  Diagnosis Date  . Diabetes mellitus   . Coronary artery disease   . Hypertension   . Gout   . CHF (congestive heart failure)   . Afib   . Arthritis   . Hypertensive emergency 11/19/2010  . Pulmonary edema 11/19/2010  . Acute exacerbation of congestive heart failure 11/19/2010  . Thyroiditis 11/20/2010  . ICH (intracerebral hemorrhage) 12/22/2010  . Stroke 11/19/2010  . Respiratory failure 11/19/2010  . CAD (coronary artery disease) 11/20/2010  . Physical deconditioning 12/22/2010  . Diabetes mellitus 11/19/2010  . A-fib 11/19/2010  . Hyperlipemia 11/21/2010    Past Surgical History  Procedure Laterality Date  . Tracheostomy tube placement  11/28/2010    Procedure: TRACHEOSTOMY;  Surgeon: Susy Frizzle, MD;  Location: [facility] OR;  Service: ENT;  Laterality: N/A;  . Peg placement  12/03/2010    Procedure:  PERCUTANEOUS ENDOSCOPIC GASTROSTOMY (PEG) PLACEMENT;  Surgeon: Hart Carwin, MD;  Location: [facility] ENDOSCOPY;  Service: Endoscopy;  Laterality: N/A;    No family history on file.  History  Substance Use Topics  . Smoking status: Former Smoker    Types: Cigarettes    Quit date: 12/19/2005  . Smokeless tobacco: Not on file  . Alcohol Use: 6.6 oz/week    6 Glasses of wine, 0 Cans of beer, 5 Shots of liquor, 0 Drinks containing 0.5 oz of alcohol per week      Review of Systems As outlined in history of present illness Allergies  Review of patient's allergies indicates no known allergies.  Home Medications   Current Outpatient Rx  Name  Route  Sig  Dispense  Refill  . allopurinol (ZYLOPRIM) 100 MG tablet   Oral   Take 1 tablet (100 mg total) by mouth daily.   30 tablet   3     To prevent gout flareup   . aspirin 81 MG tablet   Oral   Take 1 tablet (81 mg total) by mouth daily.   30 tablet   1   . baclofen (LIORESAL) 10 MG tablet   Oral   Take 0.5 tablets (5 mg total) by mouth 3 (three) times daily.   45 tablet   1     To prevent hiccups.  In a few days try to taper to ...   . carvedilol (COREG) 25 MG tablet   Per Tube   Place 1  tablet (25 mg total) into feeding tube 2 (two) times daily with a meal.   60 tablet   1   . diltiazem (CARDIZEM LA) 180 MG 24 hr tablet   Oral   Take 2 tablets (360 mg total) by mouth daily at 12 noon.   60 tablet   1   . doxazosin (CARDURA) 2 MG tablet   Oral   Take 1 tablet (2 mg total) by mouth at bedtime.   30 tablet   1   . glucose monitoring kit (FREESTYLE) monitoring kit   Does not apply   1 each by Does not apply route 4 (four) times daily - after meals and at bedtime. 1 month Diabetic Testing Supplies for QAC-QHS accuchecks.   1 each   1   . isosorbide-hydrALAZINE (BIDIL) 20-37.5 MG per tablet   Per Tube   Place 2 tablets into feeding tube 3 (three) times daily.   90 tablet   3     For heart.   . metFORMIN  (GLUCOPHAGE) 850 MG tablet   Oral   Take 1 tablet (850 mg total) by mouth 2 (two) times daily with a meal.   30 tablet   0   . pantoprazole (PROTONIX) 40 MG tablet   Oral   Take 1 tablet (40 mg total) by mouth daily at 12 noon.   30 tablet   1   . pravastatin (PRAVACHOL) 20 MG tablet   Oral   Take 1 tablet (20 mg total) by mouth daily.   30 tablet   3   . ramipril (ALTACE) 10 MG capsule   Per Tube   Place 1 capsule (10 mg total) into feeding tube 2 (two) times daily.   30 capsule   3     For blood pressure.   Marland Kitchen spironolactone (ALDACTONE) 25 MG tablet   Oral   Take 1 tablet (25 mg total) by mouth daily.   30 tablet   3     BP 133/106  Pulse 68  Temp(Src) 97.8 F (36.6 C) (Oral)  Resp 16  SpO2 100%  Physical Exam Middle aged male in no acute distress HEENT: No pallor, moist oral mucosa, slurred speech Chest: Clear to auscultation bilaterally, It sounds CVS: Normal S1-S2, no murmurs rub or gallop Abdomen: Soft, nontender, nondistended, bowel sounds present Extremities: Warm, no edema CNS: Aortic x3, slurred speech, power 4+/5 overt left extremity. Normal sensation ED Course  Procedures (including critical care time)  Labs Reviewed - No data to display No results found.   1. Diabetes mellitus, type 2   2. Uncontrolled hypertension     #1 diabetes mellitus Change lantus to metformin 850 mg bid . Patient has difficulty with taking insulin and monitoring fsg. Hb A1 C of 7. I have instructed him to keep a log of his blood glucose monitoring and bring it during his next visit.  Elevated  BP.  On multiple different BP meds. Will increase dose of imdur -hydralazine ( dose doubled.) F/up in 2 weeks  History of CVA with residual weakness Continue statin and aspirin  ordered referral for home health PT/OT and RN   And the remaining home medications   MDM    Followup in 2 weeks for blood pressure monitoring and evaluate fingerstick log      Raylin Diguglielmo, MD 05/17/12 1828

## 2012-05-20 NOTE — ED Notes (Signed)
Referral faxed for ot and pt

## 2012-05-31 ENCOUNTER — Ambulatory Visit: Payer: No Typology Code available for payment source | Attending: Internal Medicine | Admitting: Internal Medicine

## 2012-05-31 ENCOUNTER — Telehealth: Payer: Self-pay | Admitting: General Practice

## 2012-05-31 VITALS — BP 122/83 | HR 98 | Temp 98.1°F | Resp 20 | Ht 71.0 in

## 2012-05-31 DIAGNOSIS — E119 Type 2 diabetes mellitus without complications: Secondary | ICD-10-CM | POA: Insufficient documentation

## 2012-05-31 DIAGNOSIS — I4891 Unspecified atrial fibrillation: Secondary | ICD-10-CM | POA: Insufficient documentation

## 2012-05-31 DIAGNOSIS — I619 Nontraumatic intracerebral hemorrhage, unspecified: Secondary | ICD-10-CM

## 2012-05-31 DIAGNOSIS — I10 Essential (primary) hypertension: Secondary | ICD-10-CM | POA: Insufficient documentation

## 2012-05-31 NOTE — Telephone Encounter (Signed)
[facility] called because incorrect demographics sheet was sent, they received the facesheet of another pt.  Please resend demo sheet with insurance info.    Thanks

## 2012-05-31 NOTE — Progress Notes (Signed)
Patient ID: [name], male   DOB: [birth year], 57 y.o.   MRN: [id number] Patient Demographics  [name], is a 57 y.o. male  EXB:[id number]  [id number]  DOB - 07/24/1955  Chief Complaint  Patient presents with  . Establish Care    Patient also has paper work to fill out.        Subjective:   [name] today is here for a follow up visit.  Patient has No headache, No chest pain, No abdominal pain - No Nausea, No new weakness tingling or numbness, No Cough - SOB.  57 year old male with history of CVA with residual left-sided weakness and slurred speech ,CAD, hypertension, A. fib, (not on anticoagulation due to recurrent intracranial hemorrhaged. Patient previously was on on insulin since hospital discharge in late 2012 but has  switched back to metformin which she took in the past. He also requests  home health PT, OT, and RN as he has difficulty ambulating and uses a walker at home. Patient denies any chest pain, palpitations, shortness of breath, headache, dizziness, blurry vision, nausea, vomiting, abdominal pain, bowel or urinary symptoms. Denies any new weakness.        Objective:    Filed Vitals:   05/31/12 1318  BP: 122/83  Pulse: 98  Temp: 98.1 F (36.7 C)  TempSrc: Oral  Resp: 20  Height: 5\' 11"  (1.803 m)  SpO2: 98%     ALLERGIES:  No Known Allergies  PAST MEDICAL HISTORY: Past Medical History  Diagnosis Date  . Diabetes mellitus   . Coronary artery disease   . Hypertension   . Gout   . CHF (congestive heart failure)   . Afib   . Arthritis   . Hypertensive emergency 11/19/2010  . Pulmonary edema 11/19/2010  . Acute exacerbation of congestive heart failure 11/19/2010  . Thyroiditis 11/20/2010  . ICH (intracerebral hemorrhage) 12/22/2010  . Stroke 11/19/2010  . Respiratory failure 11/19/2010  . CAD (coronary artery disease) 11/20/2010  . Physical deconditioning 12/22/2010  . Diabetes mellitus 11/19/2010  . A-fib 11/19/2010  . Hyperlipemia  11/21/2010    MEDICATIONS AT HOME: Prior to Admission medications   Medication Sig Start Date End Date Taking? Authorizing Provider  allopurinol (ZYLOPRIM) 100 MG tablet Take 1 tablet (100 mg total) by mouth daily. 05/17/12  Yes Nishant Dhungel, MD  aspirin 81 MG tablet Take 1 tablet (81 mg total) by mouth daily. 05/17/12  Yes Nishant Dhungel, MD  baclofen (LIORESAL) 10 MG tablet Take 0.5 tablets (5 mg total) by mouth 3 (three) times daily. 01/19/11  Yes Evlyn Kanner Love, PA-C  carvedilol (COREG) 25 MG tablet Place 1 tablet (25 mg total) into feeding tube 2 (two) times daily with a meal. 05/17/12  Yes Nishant Dhungel, MD  diltiazem (CARDIZEM LA) 180 MG 24 hr tablet Take 2 tablets (360 mg total) by mouth daily at 12 noon. 05/17/12 05/17/13 Yes Nishant Dhungel, MD  doxazosin (CARDURA) 2 MG tablet Take 1 tablet (2 mg total) by mouth at bedtime. 05/17/12 05/17/13 Yes Nishant Dhungel, MD  glucose monitoring kit (FREESTYLE) monitoring kit 1 each by Does not apply route 4 (four) times daily - after meals and at bedtime. 1 month Diabetic Testing Supplies for QAC-QHS accuchecks. 03/31/12  Yes Leroy Sea, MD  isosorbide-hydrALAZINE (BIDIL) 20-37.5 MG per tablet Place 2 tablets into feeding tube 3 (three) times daily. 05/17/12  Yes Nishant Dhungel, MD  metFORMIN (GLUCOPHAGE) 850 MG tablet Take 1 tablet (850 mg total) by mouth 2 (two)  times daily with a meal. 05/17/12  Yes Nishant Dhungel, MD  pantoprazole (PROTONIX) 40 MG tablet Take 1 tablet (40 mg total) by mouth daily at 12 noon. 03/31/12 03/31/13 Yes Leroy Sea, MD  pravastatin (PRAVACHOL) 20 MG tablet Take 1 tablet (20 mg total) by mouth daily. 05/17/12  Yes Nishant Dhungel, MD  ramipril (ALTACE) 10 MG capsule Place 1 capsule (10 mg total) into feeding tube 2 (two) times daily. 05/17/12 05/17/13 Yes Nishant Dhungel, MD  spironolactone (ALDACTONE) 25 MG tablet Take 1 tablet (25 mg total) by mouth daily. 05/17/12  Yes Eddie North, MD     Exam  General appearance  :Awake, alert, not in any distress. Speech Clear. Not toxic Looking HEENT: Atraumatic and Normocephalic, pupils equally reactive to light and accomodation Neck: supple, no JVD. No cervical lymphadenopathy.  Chest:Good air entry bilaterally, no added sounds  CVS: S1 S2 regular, no murmurs.  Abdomen: Bowel sounds present, Non tender and not distended with no gaurding, rigidity or rebound. Extremities: B/L Lower Ext shows no edema, both legs are warm to touch Neurology: Awake alert, and oriented X 3, CN II-XII intact, RUE and RLE-4+/5 Skin:No Rash Wounds:N/A    Data Review   CBC No results found for this basename: WBC, HGB, HCT, PLT, MCV, MCH, MCHC, RDW, NEUTRABS, LYMPHSABS, MONOABS, EOSABS, BASOSABS, BANDABS, BANDSABD,  in the last 168 hours  Chemistries   No results found for this basename: NA, K, CL, CO2, GLUCOSE, BUN, CREATININE, GFRCGP, CALCIUM, MG, AST, ALT, ALKPHOS, BILITOT,  in the last 168 hours ------------------------------------------------------------------------------------------------------------------ No results found for this basename: HGBA1C,  in the last 72 hours ------------------------------------------------------------------------------------------------------------------ No results found for this basename: CHOL, HDL, LDLCALC, TRIG, CHOLHDL, LDLDIRECT,  in the last 72 hours ------------------------------------------------------------------------------------------------------------------ No results found for this basename: TSH, T4TOTAL, FREET3, T3FREE, THYROIDAB,  in the last 72 hours ------------------------------------------------------------------------------------------------------------------ No results found for this basename: VITAMINB12, FOLATE, FERRITIN, TIBC, IRON, RETICCTPCT,  in the last 72 hours  Coagulation profile  No results found for this basename: INR, PROTIME,  in the last 168 hours    Assessment & Plan   History of CVA with residual weakness  (ischemic CVA with hge conversion) Continue statin and aspirin  Have asked staff to see why referral for home health PT/OT and RN not done yet  Afib -stable -not a coumadin candidate given ICH -On ASA  HTN -controlled -c/w current anti-hypertensives  DM -per patient CBG's have been in the low to mid 100's range -need to repeat A1C in August  Follow up 1 month

## 2012-06-14 ENCOUNTER — Other Ambulatory Visit: Payer: Self-pay | Admitting: Internal Medicine

## 2012-06-16 ENCOUNTER — Other Ambulatory Visit: Payer: Self-pay | Admitting: Family Medicine

## 2012-06-16 DIAGNOSIS — E119 Type 2 diabetes mellitus without complications: Secondary | ICD-10-CM

## 2012-06-16 MED ORDER — METFORMIN HCL 850 MG PO TABS: 850.0000 mg | ORAL_TABLET | Freq: Two times a day (BID) | ORAL | Status: DC

## 2012-06-16 NOTE — Progress Notes (Signed)
Pt ran out of metformin, saying he takes it twice daily but was only given 30 tabs. Will give him one months suplpy and am having him f/u around 6/20 as per most recent OV note.

## 2012-06-30 ENCOUNTER — Other Ambulatory Visit: Payer: Self-pay | Admitting: Internal Medicine

## 2012-07-01 ENCOUNTER — Ambulatory Visit: Payer: Medicaid Other | Attending: Family Medicine | Admitting: Internal Medicine

## 2012-07-01 VITALS — BP 120/77 | HR 68 | Temp 98.7°F | Resp 18

## 2012-07-01 DIAGNOSIS — M109 Gout, unspecified: Secondary | ICD-10-CM

## 2012-07-01 DIAGNOSIS — E119 Type 2 diabetes mellitus without complications: Secondary | ICD-10-CM

## 2012-07-01 DIAGNOSIS — E785 Hyperlipidemia, unspecified: Secondary | ICD-10-CM

## 2012-07-01 DIAGNOSIS — I10 Essential (primary) hypertension: Secondary | ICD-10-CM

## 2012-07-01 DIAGNOSIS — I5042 Chronic combined systolic (congestive) and diastolic (congestive) heart failure: Secondary | ICD-10-CM

## 2012-07-01 DIAGNOSIS — I509 Heart failure, unspecified: Secondary | ICD-10-CM

## 2012-07-01 HISTORY — DX: Essential (primary) hypertension: I10

## 2012-07-01 MED ORDER — ALLOPURINOL 100 MG PO TABS: 100.0000 mg | ORAL_TABLET | Freq: Every day | ORAL | Status: DC

## 2012-07-01 MED ORDER — DOXAZOSIN MESYLATE 2 MG PO TABS: 2.0000 mg | ORAL_TABLET | Freq: Every day | ORAL | Status: DC

## 2012-07-01 MED ORDER — PANTOPRAZOLE SODIUM 40 MG PO TBEC: 40.0000 mg | DELAYED_RELEASE_TABLET | Freq: Every day | ORAL | Status: DC

## 2012-07-01 MED ORDER — PRAVASTATIN SODIUM 20 MG PO TABS: 20.0000 mg | ORAL_TABLET | Freq: Every day | ORAL | Status: DC

## 2012-07-01 MED ORDER — ASPIRIN 81 MG PO TABS: 81.0000 mg | ORAL_TABLET | Freq: Every day | ORAL | Status: DC

## 2012-07-01 MED ORDER — SPIRONOLACTONE 25 MG PO TABS: 25.0000 mg | ORAL_TABLET | Freq: Every day | ORAL | Status: DC

## 2012-07-01 MED ORDER — CARVEDILOL 25 MG PO TABS: 25.0000 mg | ORAL_TABLET | Freq: Two times a day (BID) | ORAL | Status: DC

## 2012-07-01 MED ORDER — RAMIPRIL 10 MG PO CAPS: 10.0000 mg | ORAL_CAPSULE | Freq: Two times a day (BID) | ORAL | Status: DC

## 2012-07-01 MED ORDER — FREESTYLE SYSTEM KIT: 1.0000 | PACK | Freq: Three times a day (TID) | Status: DC

## 2012-07-01 MED ORDER — BACLOFEN 10 MG PO TABS: 5.0000 mg | ORAL_TABLET | Freq: Three times a day (TID) | ORAL | Status: DC

## 2012-07-01 MED ORDER — METFORMIN HCL 850 MG PO TABS: 850.0000 mg | ORAL_TABLET | Freq: Two times a day (BID) | ORAL | Status: DC

## 2012-07-01 MED ORDER — ISOSORB DINITRATE-HYDRALAZINE 20-37.5 MG PO TABS: 2.0000 | ORAL_TABLET | Freq: Three times a day (TID) | ORAL | Status: DC

## 2012-07-01 MED ORDER — DILTIAZEM HCL ER COATED BEADS 180 MG PO TB24: 360.0000 mg | ORAL_TABLET | Freq: Every day | ORAL | Status: DC

## 2012-07-01 NOTE — Progress Notes (Signed)
07/01/12 FOLLOW UP WITH DIABETES P. Luiz Ochoa

## 2012-07-01 NOTE — Patient Instructions (Signed)
Blood Sugar Monitoring, Adult  GLUCOSE METERS FOR SELF-MONITORING OF BLOOD GLUCOSE   It is important to be able to correctly measure your blood sugar (glucose). You can use a blood glucose monitor (a small battery-operated device) to check your glucose level at any time. This allows you and your caregiver to monitor your diabetes and to determine how well your treatment plan is working. The process of monitoring your blood glucose with a glucose meter is called self-monitoring of blood glucose (SMBG). When people with diabetes control their blood sugar, they have better health.  To test for glucose with a typical glucose meter, place the disposable strip in the meter. Then place a small sample of blood on the "test strip." The test strip is coated with chemicals that combine with glucose in blood. The meter measures how much glucose is present. The meter displays the glucose level as a number. Several new models can record and store a number of test results. Some models can connect to personal computers to store test results or print them out.   Newer meters are often easier to use than older models. Some meters allow you to get blood from places other than your fingertip. Some new models have automatic timing, error codes, signals, or barcode readers to help with proper adjustment (calibration). Some meters have a large display screen or spoken instructions for people with visual impairments.   INSTRUCTIONS FOR USING GLUCOSE METERS   Wash your hands with soap and warm water, or clean the area with alcohol. Dry your hands completely.   Prick the side of your fingertip with a lancet (a sharp-pointed tool used by hand).   Hold the hand down and gently milk the finger until a small drop of blood appears. Catch the blood with the test strip.    Follow the instructions for inserting the test strip and using the SMBG meter. Most meters require the meter to be turned on and the test strip to be inserted before applying the blood sample.   Record the test result.   Read the instructions carefully for both the meter and the test strips that go with it. Meter instructions are found in the user manual. Keep this manual to help you solve any problems that may arise. Many meters use "error codes" when there is a problem with the meter, the test strip, or the blood sample on the strip. You will need the manual to understand these error codes and fix the problem.   New devices are available such as laser lancets and meters that can test blood taken from "alternative sites" of the body, other than fingertips. However, you should use standard fingertip testing if your glucose changes rapidly. Also, use standard testing if:   You have eaten, exercised, or taken insulin in the past 2 hours.   You think your glucose is low.   You tend to not feel symptoms of low blood glucose (hypoglycemia).   You are ill or under stress.   Clean the meter as directed by the manufacturer.   Test the meter for accuracy as directed by the manufacturer.   Take your meter with you to your caregiver's office. This way, you can test your glucose in front of your caregiver to make sure you are using the meter correctly. Your caregiver can also take a sample of blood to test using a routine lab method. If values on the glucose meter are close to the lab results, you and your caregiver will see   that your meter is working well and you are using good technique. Your caregiver will advise you about what to do if the results do not match.  FREQUENCY OF TESTING    Your caregiver will tell you how often you should check your blood glucose. This will depend on your type of diabetes, your current level of diabetes control, and your types of medicines. The following are general guidelines, but your care plan may be different. Record all your readings and the time of day you took them for review with your caregiver.    Diabetes type 1.   When you are using insulin with good diabetic control (either multiple daily injections or via a pump), you should check your glucose 4 times a day.   If your diabetes is not well controlled, you may need to monitor more frequently, including before meals and 2 hours after meals, at bedtime, and occasionally between 2 a.m. and 3 a.m.   You should always check your glucose before a dose of insulin or before changing the rate on your insulin pump.   Diabetes type 2.   Guidelines for SMBG in diabetes type 2 are not as well defined.   If you are on insulin, follow the guidelines above.   If you are on medicines, but not insulin, and your glucose is not well controlled, you should test at least twice daily.   If you are not on insulin, and your diabetes is controlled with medicines or diet alone, you should test at least once daily, usually before breakfast.   A weekly profile will help your caregiver advise you on your care plan. The week before your visit, check your glucose before a meal and 2 hours after a meal at least daily. You may want to test before and after a different meal each day so you and your caregiver can tell how well controlled your blood sugars are throughout the course of a 24 hour period.   Gestational diabetes (diabetes during pregnancy).   Frequent testing is often necessary. Accurate timing is important.   If you are not on insulin, check your glucose 4 times a day. Check it before breakfast and 1 hour after the start of each meal.    If you are on insulin, check your glucose 6 times a day. Check it before each meal and 1 hour after the first bite of each meal.   General guidelines.   More frequent testing is required at the start of insulin treatment. Your caregiver will instruct you.   Test your glucose any time you suspect you have low blood sugar (hypoglycemia).   You should test more often when you change medicines, when you have unusual stress or illness, or in other unusual circumstances.  OTHER THINGS TO KNOW ABOUT GLUCOSE METERS   Measurement Range. Most glucose meters are able to read glucose levels over a broad range of values from as low as 0 to as high as 600 mg/dL. If you get an extremely high or low reading from your meter, you should first confirm it with another reading. Report very high or very low readings to your caregiver.   Whole Blood Glucose versus Plasma Glucose. Some older home glucose meters measure glucose in your whole blood. In a lab or when using some newer home glucose meters, the glucose is measured in your plasma (one component of blood). The difference can be important. It is important for you and your caregiver to know whether your meter   gives its results as "whole blood equivalent" or "plasma equivalent."   Display of High and Low Glucose Values. Part of learning how to operate a meter is understanding what the meter results mean. Know how high and low glucose concentrations are displayed on your meter.   Factors that Affect Glucose Meter Performance. The accuracy of your test results depends on many factors and varies depending on the brand and type of meter. These factors include:   Low red blood cell count (anemia).   Substances in your blood (such as uric acid, vitamin C, and others).   Environmental factors (temperature, humidity, altitude).   Name-brand versus generic test strips.    Calibration. Make sure your meter is set up properly. It is a good idea to do a calibration test with a control solution recommended by the manufacturer of your meter whenever you begin using a fresh bottle of test strips. This will help verify the accuracy of your meter.   Improperly stored, expired, or defective test strips. Keep your strips in a dry place with the lid on.   Soiled meter.   Inadequate blood sample.  NEW TECHNOLOGIES FOR GLUCOSE TESTING  Alternative site testing  Some glucose meters allow testing blood from alternative sites. These include the:   Upper arm.   Forearm.   Base of the thumb.   Thigh.  Sampling blood from alternative sites may be desirable. However, it may have some limitations. Blood in the fingertips show changes in glucose levels more quickly than blood in other parts of the body. This means that alternative site test results may be different from fingertip test results, not because of the meter's ability to test accurately, but because the actual glucose concentration can be different.   Continuous Glucose Monitoring  Devices to measure your blood glucose continuously are available, and others are in development. These methods can be more expensive than self-monitoring with a glucose meter. However, it is uncertain how effective and reliable these devices are. Your caregiver will advise you if this approach makes sense for you.  IF BLOOD SUGARS ARE CONTROLLED, PEOPLE WITH DIABETES REMAIN HEALTHIER.   SMBG is an important part of the treatment plan of patients with diabetes mellitus. Below are reasons for using SMBG:    It confirms that your glucose is at a specific, healthy level.   It detects hypoglycemia and severe hyperglycemia.   It allows you and your caregiver to make adjustments in response to changes in lifestyle for individuals requiring medicine.    It determines the need for starting insulin therapy in temporary diabetes that happens during pregnancy (gestational diabetes).  Document Released: 01/01/2003 Document Revised: 03/23/2011 Document Reviewed: 04/24/2010  [employer] Patient Information 2014 [employer].

## 2012-07-01 NOTE — Progress Notes (Signed)
Patient ID: [name], male   DOB: [birth year], 57 y.o.   MRN: [id number]  CC: follow up   HPI: 57 year old male with multiple comorbidities including diabetes, hypertension, gout, systolic and diastolic heart failure who presents to our clinic for followup. Patient reports being compliant with all the medications and he feels great. No complaints of shortness of breath, chest pain or palpitations.  No Known Allergies Past Medical History  Diagnosis Date  . Diabetes mellitus   . Coronary artery disease   . Hypertension   . Gout   . CHF (congestive heart failure)   . Afib   . Arthritis   . Hypertensive emergency 11/19/2010  . Pulmonary edema 11/19/2010  . Acute exacerbation of congestive heart failure 11/19/2010  . Thyroiditis 11/20/2010  . ICH (intracerebral hemorrhage) 12/22/2010  . Stroke 11/19/2010  . Respiratory failure 11/19/2010  . CAD (coronary artery disease) 11/20/2010  . Physical deconditioning 12/22/2010  . Diabetes mellitus 11/19/2010  . A-fib 11/19/2010  . Hyperlipemia 11/21/2010   Current Outpatient Prescriptions on File Prior to Visit  Medication Sig Dispense Refill  . [DISCONTINUED] insulin glargine (LANTUS) 100 UNIT/ML injection Inject 0.15 mLs (15 Units total) into the skin at bedtime.  12 mL  1  . [DISCONTINUED] potassium chloride (KLOR-CON M15) 15 MEQ tablet Take 2 tablets (30 mEq total) by mouth 2 (two) times daily.  120 tablet  1  . [DISCONTINUED] rosuvastatin (CRESTOR) 5 MG tablet Place 1 tablet (5 mg total) into feeding tube daily at 6 PM.  30 tablet  2   No current facility-administered medications on file prior to visit.   Family medical history significant for HTN, HLD  History   Social History  . Marital Status: Single    Spouse Name: N/A    Number of Children: N/A  . Years of Education: N/A   Occupational History  . Not on file.   Social History Main Topics  . Smoking status: Former Smoker    Types: Cigarettes    Quit date: 12/19/2005  .  Smokeless tobacco: Not on file  . Alcohol Use: 6.6 oz/week    6 Glasses of wine, 0 Cans of beer, 5 Shots of liquor, 0 Drinks containing 0.5 oz of alcohol per week  . Drug Use: No  . Sexually Active:    Other Topics Concern  . Not on file   Social History Narrative  . No narrative on file    Review of Systems  Constitutional: Negative for fever, chills, diaphoresis, activity change, appetite change and fatigue.  HENT: Negative for ear pain, nosebleeds, congestion, facial swelling, rhinorrhea, neck pain, neck stiffness and ear discharge.   Eyes: Negative for pain, discharge, redness, itching and visual disturbance.  Respiratory: Negative for cough, choking, chest tightness, shortness of breath, wheezing and stridor.   Cardiovascular: Negative for chest pain, palpitations and leg swelling.  Gastrointestinal: Negative for abdominal distention.  Genitourinary: Negative for dysuria, urgency, frequency, hematuria, flank pain, decreased urine volume, difficulty urinating and dyspareunia.  Musculoskeletal: Negative for back pain, joint swelling, arthralgias and gait problem.  Neurological: Negative for dizziness, tremors, seizures, syncope, facial asymmetry, speech difficulty, weakness, light-headedness, numbness and headaches.  Hematological: Negative for adenopathy. Does not bruise/bleed easily.  Psychiatric/Behavioral: Negative for hallucinations, behavioral problems, confusion, dysphoric mood, decreased concentration and agitation.    Objective:   Filed Vitals:   07/01/12 1342  BP: 120/77  Pulse: 68  Temp: 98.7 F (37.1 C)  Resp: 18    Physical Exam  Constitutional: Appears well-developed and well-nourished. No distress. In wheelchair  HENT: Normocephalic. External right and left ear normal. Oropharynx is clear and moist.  Eyes: Conjunctivae and EOM are normal. PERRLA, no scleral icterus.  Neck: Normal ROM. Neck supple. No JVD. No tracheal deviation. No thyromegaly.  CVS: RRR,  S1/S2 +, no murmurs, no gallops, no carotid bruit.  Pulmonary: Effort and breath sounds normal, no stridor, rhonchi, wheezes, rales.  Abdominal: Soft. BS +,  no distension, tenderness, rebound or guarding.  Musculoskeletal: Normal range of motion. (+1) LE edema and no tenderness.  Lymphadenopathy: No lymphadenopathy noted, cervical, inguinal. Neuro: Alert. Normal reflexes, muscle tone coordination. No cranial nerve deficit. Skin: Skin is warm and dry. No rash noted. Not diaphoretic. No erythema. No pallor.  Psychiatric: Normal mood and affect. Behavior, judgment, thought content normal.   Lab Results  Component Value Date   WBC 6.5 05/03/2012   HGB 14.0 05/03/2012   HCT 40.0 05/03/2012   MCV 87.0 05/03/2012   PLT 222 05/03/2012   Lab Results  Component Value Date   CREATININE 1.33 05/03/2012   BUN 24* 05/03/2012   NA 137 05/03/2012   K 4.2 05/03/2012   CL 99 05/03/2012   CO2 29 05/03/2012    Lab Results  Component Value Date   HGBA1C 7.0* 05/03/2012   Lipid Panel     Component Value Date/Time   CHOL 168 05/03/2012 1355   TRIG 114 05/03/2012 1355   HDL 67 05/03/2012 1355   CHOLHDL 2.5 05/03/2012 1355   VLDL 23 05/03/2012 1355   LDLCALC 78 05/03/2012 1355       Assessment and plan:   Patient Active Problem List   Diagnosis Date Noted  . HTN (hypertension) - BP 120/77; continue current medications  - We have discussed target BP range - I have advised pt to check BP regularly and to call us back if the numbers are higher than 140/90 - discussed the importance of compliance with medical therapy and diet  07/01/2012  . Hyperlipemia - Cholesterol panel within normal limits  - Continue current meds  11/21/2010  . Gout - Continue allopurinol  11/20/2010  . Diabetes mellitus - Continue metformin  - 05/03/2012 A1c 7, glycemic control good  11/19/2010

## 2012-07-11 IMAGING — CR DG CHEST 1V PORT
1 series · 1 of 1 positions shown · non-contrast
Comparison: 12/05/2010.

CLINICAL DATA: Verify endotracheal tube placement.

PORTABLE CHEST - 1 VIEW

[AP]
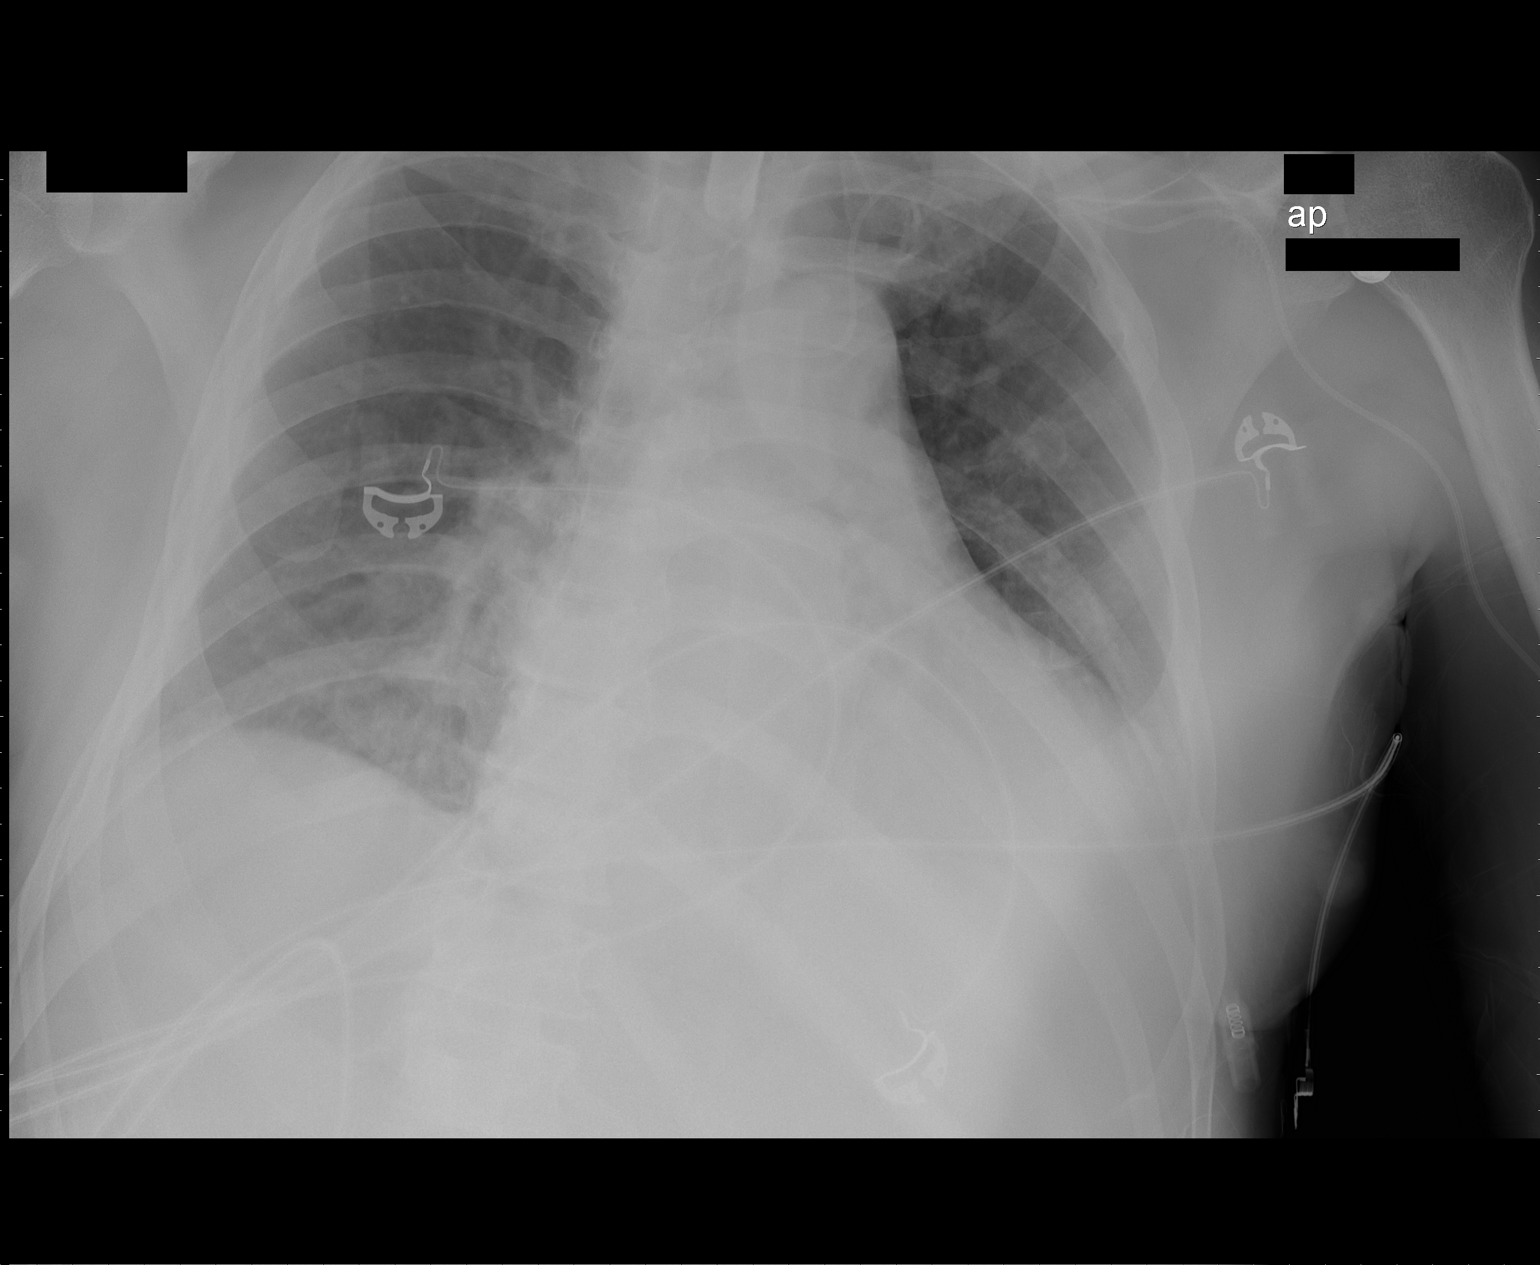

[1 of 1 positions shown; findings below may reference images not displayed]

FINDINGS: A tracheostomy tube is in place with the tip midline
cm above the carina.  No endotracheal tube is in place.

Left central line tip proximal superior vena cava level.

No gross pneumothorax.

Artifact projects over the left lateral chest wall.

Cardiomegaly and pulmonary vascular congestion most notable
centrally.

Consolidation left base may be related to artifact and atelectasis.
Basilar infiltrate not excluded.

Poor delineation of the aorta.
IMPRESSION: A tracheostomy tube is in place with the tip midline 5.3 cm above
the carina.  No endotracheal tube is in place.

Left central line tip proximal superior vena cava level.

Artifact projects over the left lateral chest wall.

Cardiomegaly and pulmonary vascular congestion most notable
centrally.

Consolidation left base may be related to artifact and atelectasis.
Basilar infiltrate not excluded.

## 2012-07-11 IMAGING — CT CT HEAD W/O CM
1 of 2 series · 13 of 30 positions shown, 17 images · non-contrast
Comparison: Multiple prior exams most recent is head CT of
12/01/2010.

CLINICAL DATA: Altered mental status.

CT HEAD WITHOUT CONTRAST
TECHNIQUE: Contiguous axial images were obtained from the base of
the skull through the vertex without contrast.

[Series 2: brain · axial · 0.47mm/px · z∈[+150,+273]mm · 13 of 28 slices shown, 17 images]
[im 2/28  brain]
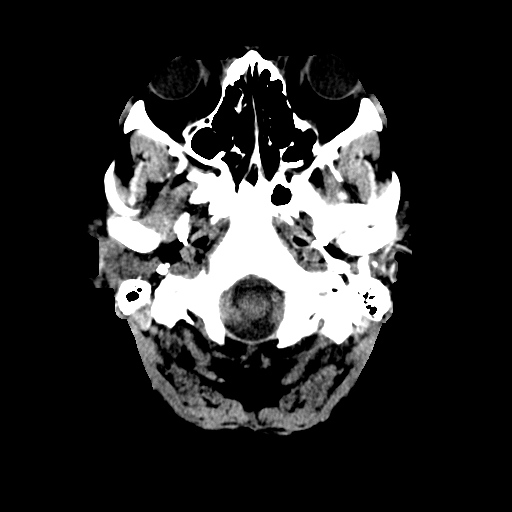
[im 2/28  bone]
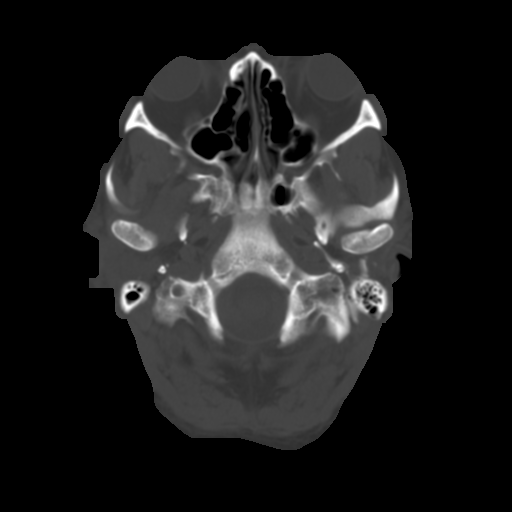
[im 4/28  brain]
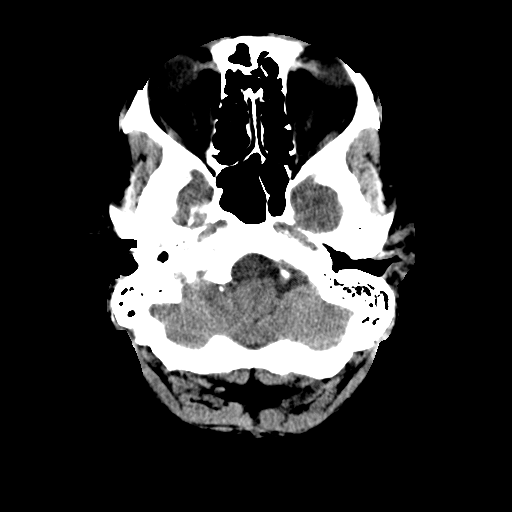
[im 6/28  brain]
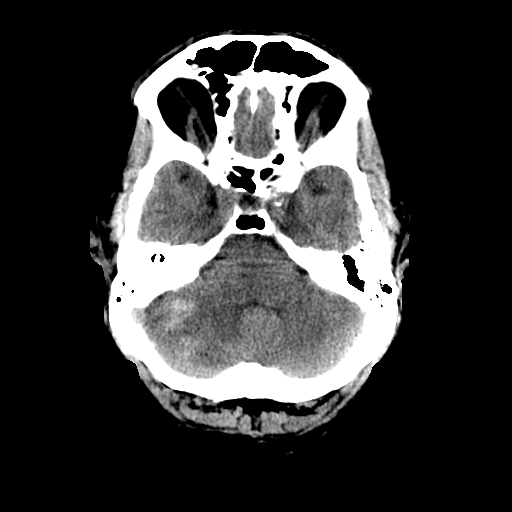
[im 8/28  brain]
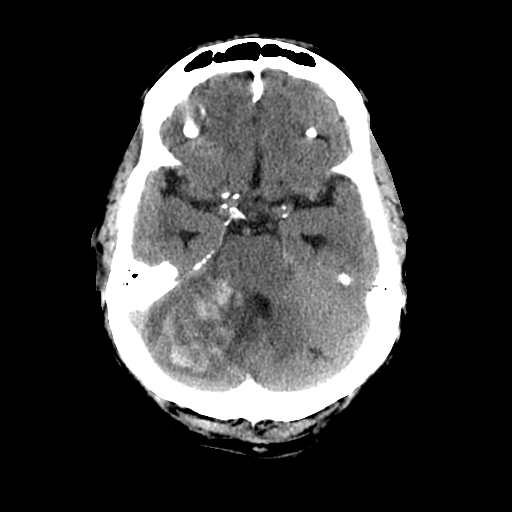
[im 10/28  brain]
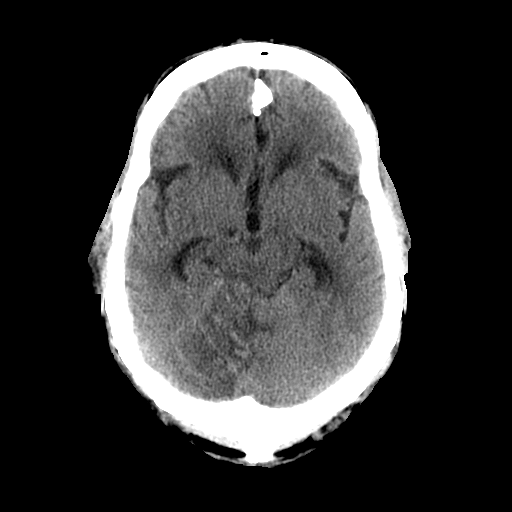
[im 10/28  bone]
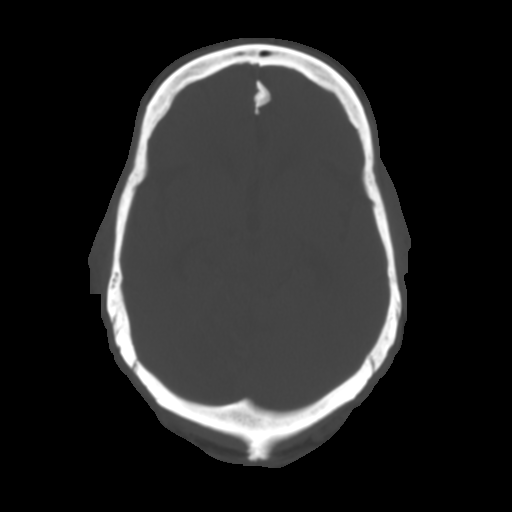
[im 12/28  brain]
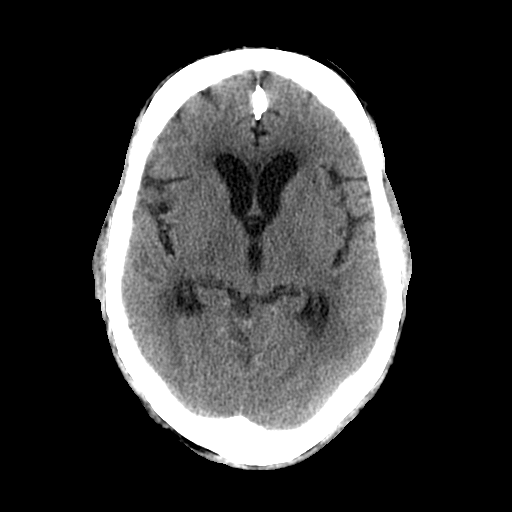
[im 14/28  brain]
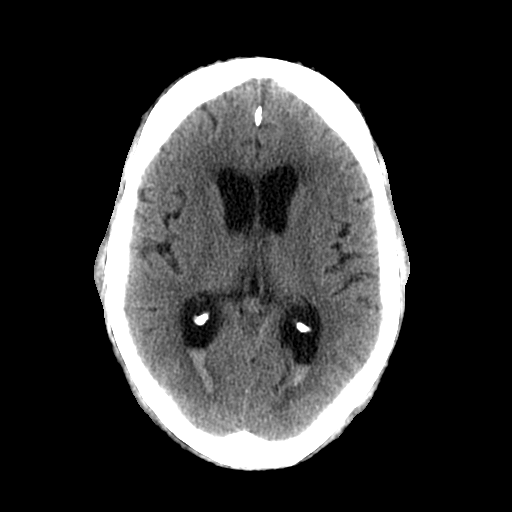
[im 16/28  brain]
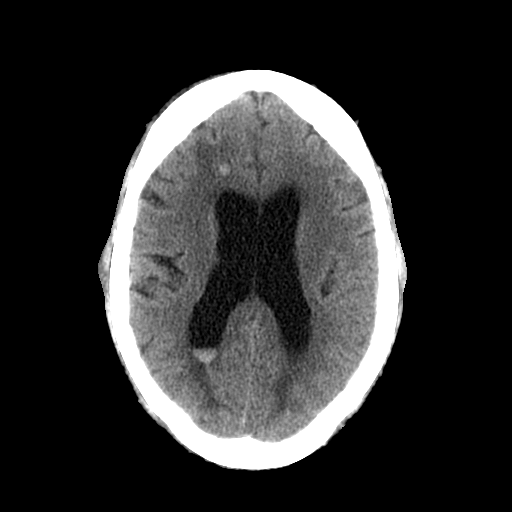
[im 18/28  brain]
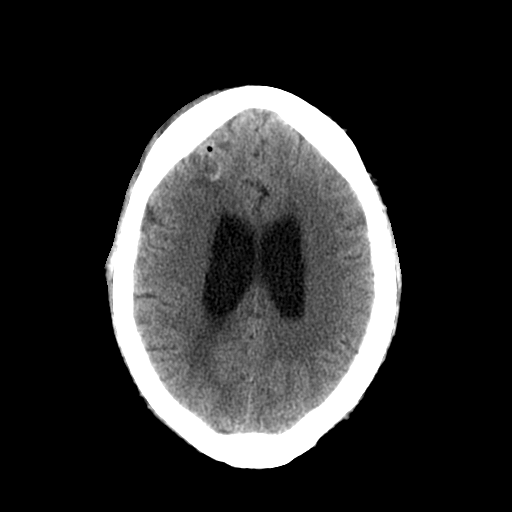
[im 18/28  bone]
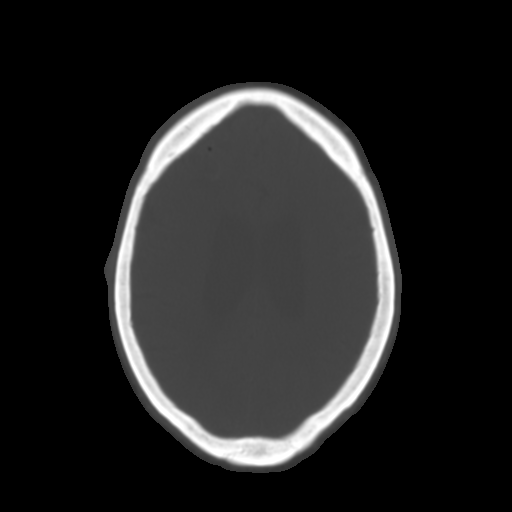
[im 20/28  brain]
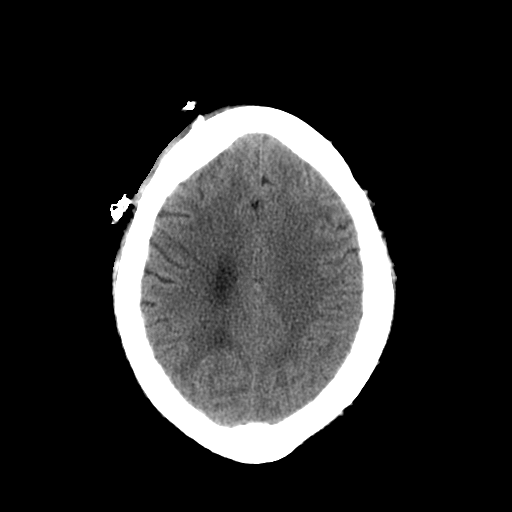
[im 22/28  brain]
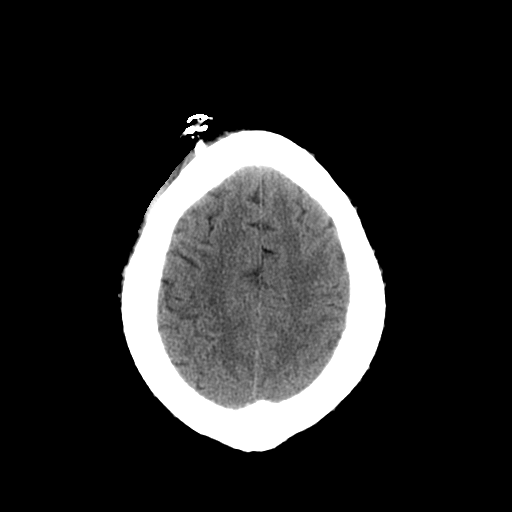
[im 24/28  brain]
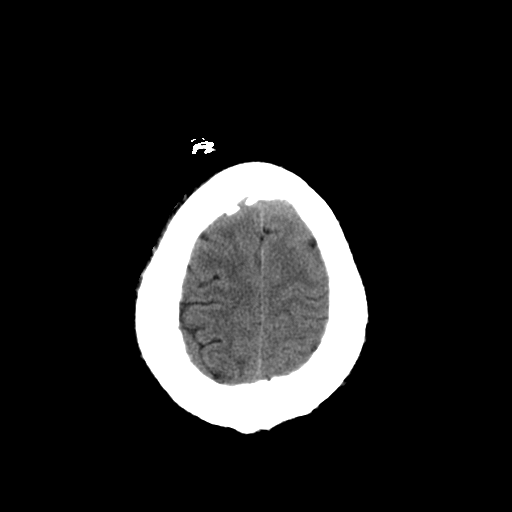
[im 26/28  brain]
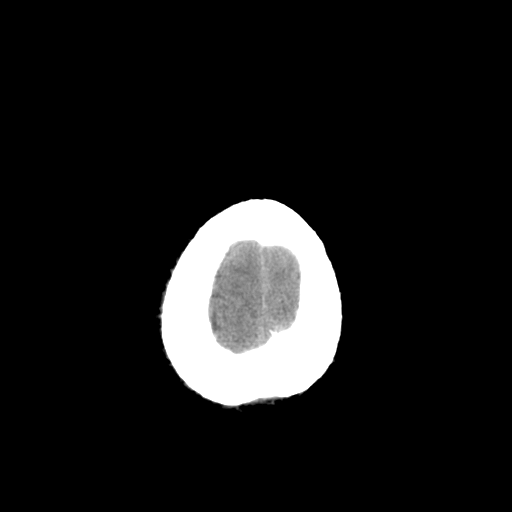
[im 26/28  bone]
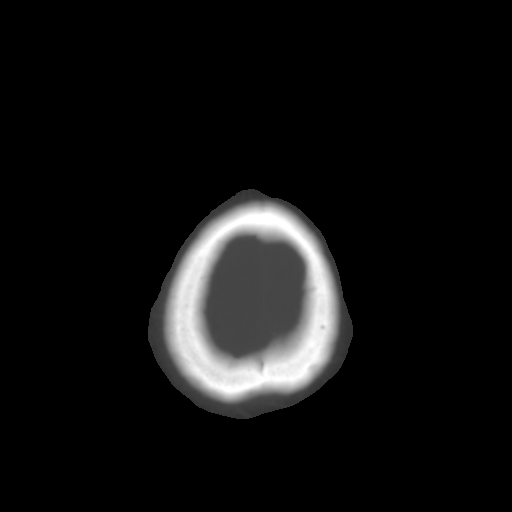

[13 of 30 positions shown; findings below may reference images not displayed]

FINDINGS: Large right hemorrhagic right cerebellar infarct with
local mass effect.  Compression of the fourth ventricle.  Upward
and downward herniation.  The amount of blood associated with this
infarct has progressed slightly since prior exam.

Amount of intraventricular blood has progressed slightly.

Right frontal shunt catheter has been removed with blood seen along
the tract of the removed catheter.

Ventricular enlargement similar to minimally more prominent.

Question new right parietal lobe infarct.
IMPRESSION: Large right hemorrhagic right cerebellar infarct with local mass
effect.  Compression of the fourth ventricle.  Upward and downward
herniation.  The amount of blood associated with this infarct has
progressed slightly since prior exam.

Amount of intraventricular blood has progressed slightly.

Right frontal shunt catheter has been removed with blood seen along
the tract of the removed catheter.

Ventricular enlargement similar to minimally more prominent.

Question new right parietal lobe infarct.

## 2012-07-14 ENCOUNTER — Telehealth: Payer: Self-pay | Admitting: Family Medicine

## 2012-07-14 NOTE — Telephone Encounter (Signed)
Please tell patient not to take if he is not having hiccups.    Rodney Langton, MD, CDE, FAAFP [facility] [place], [place]

## 2012-07-14 NOTE — Telephone Encounter (Signed)
Pt's care taker says pt was prescribed BACLOFEN 10 mg for hiccups but says that he does not have hiccups.  She is wondering why he was prescribed this med and whether he needs to keep taking.   Pt's care taker also would like a refill for Carizem.

## 2012-07-20 IMAGING — CR DG CHEST 1V PORT
1 series · 1 of 1 positions shown · non-contrast
Comparison: Chest radiograph performed 12/08/2010

CLINICAL DATA: Tracheostomy tube replacement.  Hemoptysis.  Assess
for debris in bronchi.

PORTABLE CHEST - 1 VIEW

[view not recorded]
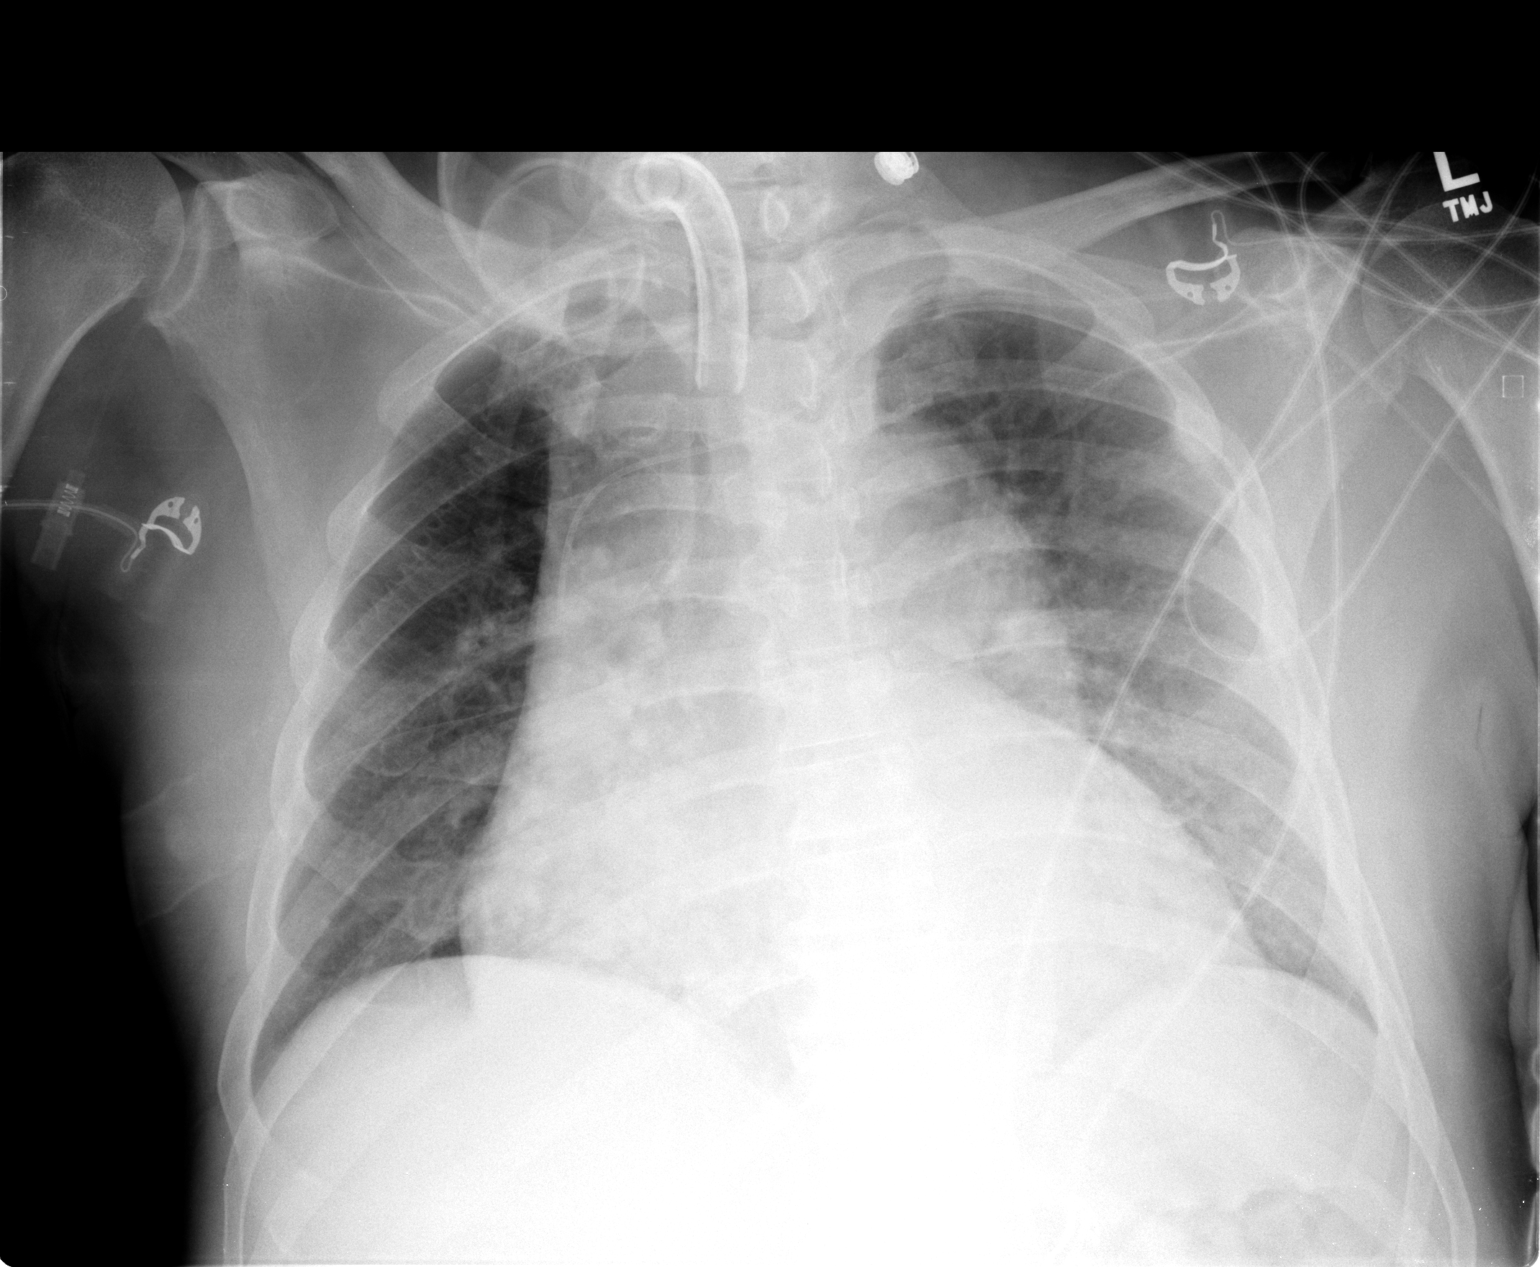

[1 of 1 positions shown; findings below may reference images not displayed]

FINDINGS: The patient's tracheostomy tube is noted ending 4-5 cm
above the carina.

The tracheobronchial tree is incompletely assessed but appears
grossly unremarkable.  There is no evidence of distal opacification
to suggest significant debris.  Patchy right basilar retrocardiac
airspace opacity raises question for atelectasis versus aspiration.

The patient's left PICC is noted ending about the mid SVC.  No
pleural effusion or pneumothorax is identified.

The cardiomediastinal silhouette is enlarged, more prominent than
on the prior study, though this may reflect rotation.  No acute
osseous abnormalities are seen.
IMPRESSION: 1.  Tracheostomy tube seen ending 4-5 cm above the carina.
2.  No evidence of distal collapse to suggest significant debris
within the airways; the visualized portions of the tracheobronchial
tree are grossly unremarkable.
3.  Right basilar airspace opacification raises question for
atelectasis versus aspiration.
4.  Cardiomegaly noted.

Findings were discussed with Hye RN on [id number] at [DATE] a.m. on
12/15/2010.

## 2012-07-24 IMAGING — CR DG CHEST 1V PORT
1 series · 1 of 1 positions shown · non-contrast
Comparison: 12/17/2010.

CLINICAL DATA: Check tracheostomy tube placement.

PORTABLE CHEST - 1 VIEW

[view not recorded]
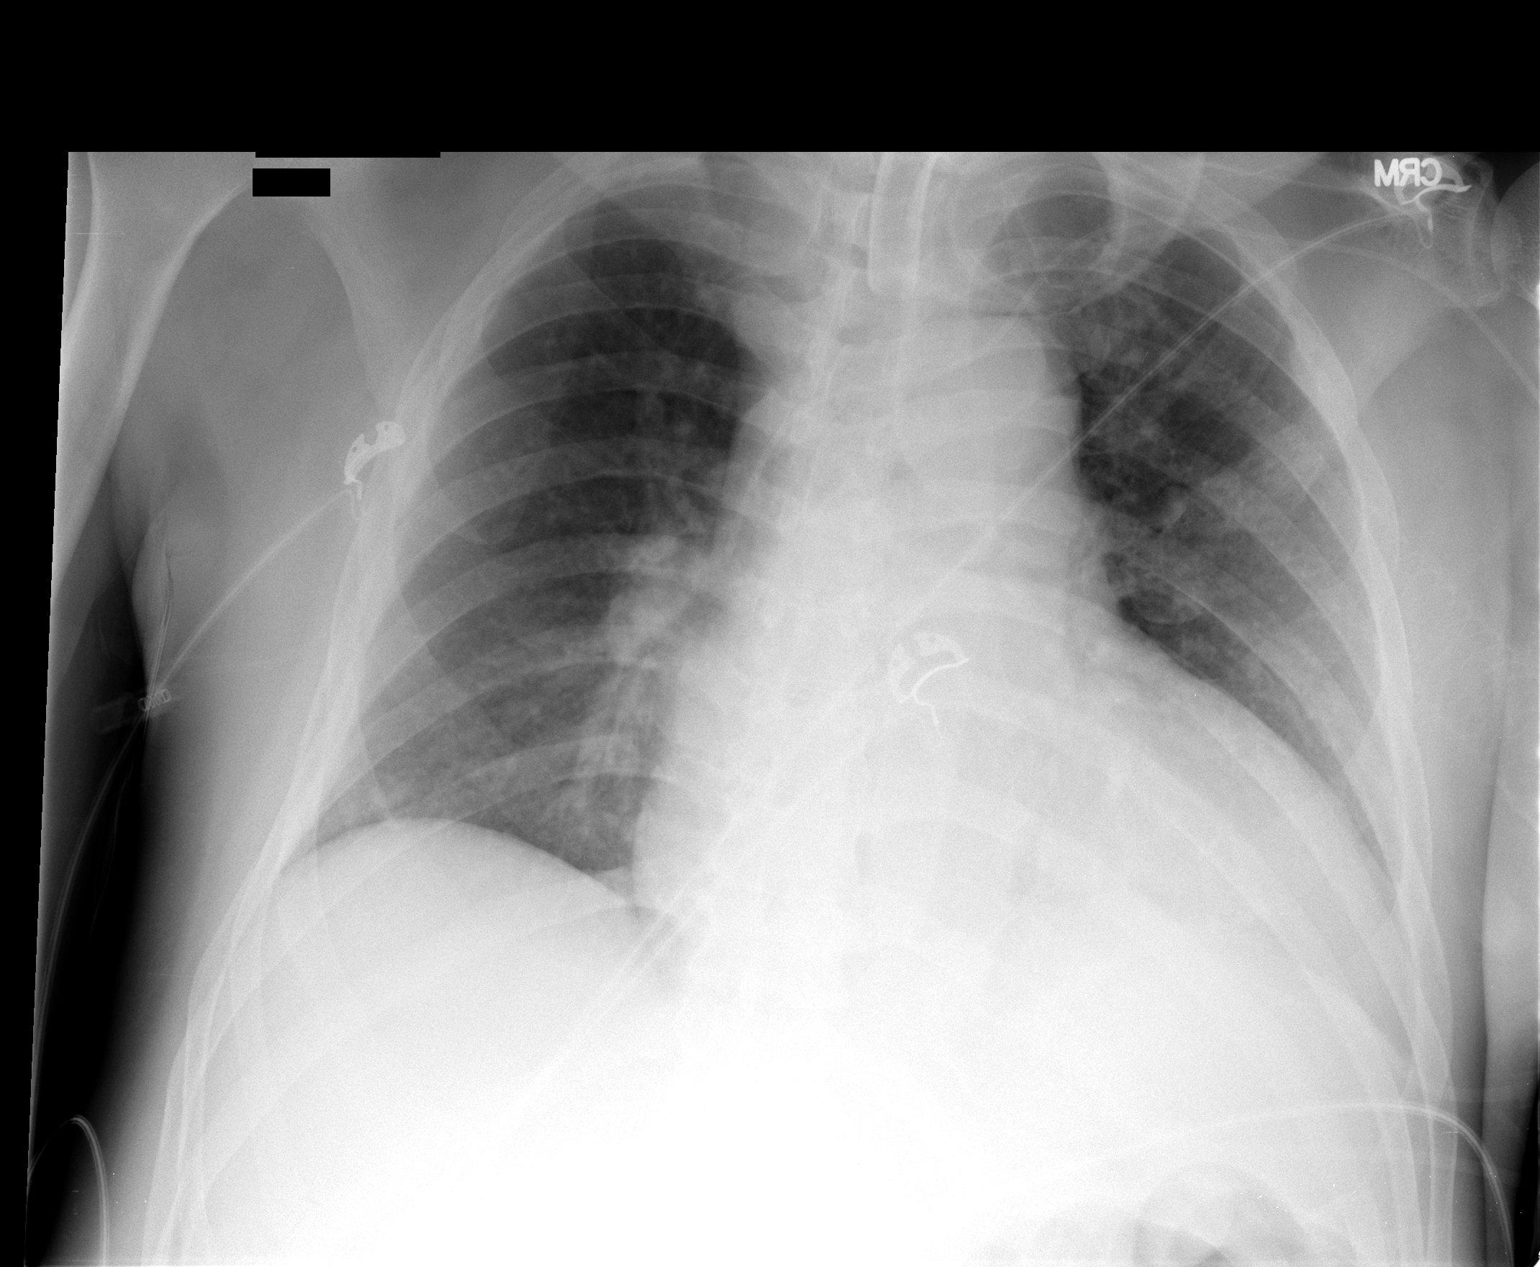

[1 of 1 positions shown; findings below may reference images not displayed]

FINDINGS: Tracheostomy tube tip midline.  Left central line tip mid
superior vena cava level.

Cardiomegaly.  Central pulmonary vascular prominence.

Left base atelectasis, no gross pneumothorax.  Minimally tortuous
aorta.
IMPRESSION: No significant change.

## 2012-08-15 ENCOUNTER — Ambulatory Visit: Payer: Medicaid Other | Attending: Family Medicine | Admitting: Internal Medicine

## 2012-08-15 ENCOUNTER — Encounter: Payer: Self-pay | Admitting: Family Medicine

## 2012-08-15 VITALS — BP 101/70 | HR 98 | Temp 97.9°F | Resp 16 | Ht 71.0 in | Wt 153.4 lb

## 2012-08-15 DIAGNOSIS — Z09 Encounter for follow-up examination after completed treatment for conditions other than malignant neoplasm: Secondary | ICD-10-CM | POA: Insufficient documentation

## 2012-08-15 DIAGNOSIS — M79609 Pain in unspecified limb: Secondary | ICD-10-CM | POA: Insufficient documentation

## 2012-08-15 DIAGNOSIS — E119 Type 2 diabetes mellitus without complications: Secondary | ICD-10-CM | POA: Insufficient documentation

## 2012-08-15 LAB — POCT GLYCOSYLATED HEMOGLOBIN (HGB A1C): Hemoglobin A1C: 6

## 2012-08-15 MED ORDER — PRAVASTATIN SODIUM 20 MG PO TABS: 20.0000 mg | ORAL_TABLET | Freq: Every day | ORAL | Status: DC

## 2012-08-15 MED ORDER — RAMIPRIL 10 MG PO CAPS: 10.0000 mg | ORAL_CAPSULE | Freq: Two times a day (BID) | ORAL | Status: DC

## 2012-08-15 MED ORDER — DILTIAZEM HCL ER COATED BEADS 180 MG PO TB24: 360.0000 mg | ORAL_TABLET | Freq: Every day | ORAL | Status: DC

## 2012-08-15 MED ORDER — DOXAZOSIN MESYLATE 2 MG PO TABS: 2.0000 mg | ORAL_TABLET | Freq: Every day | ORAL | Status: DC

## 2012-08-15 MED ORDER — METFORMIN HCL 850 MG PO TABS: 850.0000 mg | ORAL_TABLET | Freq: Two times a day (BID) | ORAL | Status: DC

## 2012-08-15 MED ORDER — ALLOPURINOL 100 MG PO TABS: 100.0000 mg | ORAL_TABLET | Freq: Every day | ORAL | Status: DC

## 2012-08-15 MED ORDER — ISOSORB DINITRATE-HYDRALAZINE 20-37.5 MG PO TABS: 2.0000 | ORAL_TABLET | Freq: Three times a day (TID) | ORAL | Status: DC

## 2012-08-15 MED ORDER — NAPROXEN 500 MG PO TABS: 500.0000 mg | ORAL_TABLET | Freq: Two times a day (BID) | ORAL | Status: DC

## 2012-08-15 MED ORDER — SPIRONOLACTONE 25 MG PO TABS: 25.0000 mg | ORAL_TABLET | Freq: Every day | ORAL | Status: DC

## 2012-08-15 MED ORDER — CARVEDILOL 25 MG PO TABS
25.0000 mg | ORAL_TABLET | Freq: Two times a day (BID) | ORAL | Status: DC
Start: 2012-08-15 — End: 2012-12-05

## 2012-08-15 MED ORDER — CARVEDILOL 25 MG PO TABS: 25.0000 mg | ORAL_TABLET | Freq: Two times a day (BID) | ORAL | Status: DC

## 2012-08-15 MED ORDER — ASPIRIN 81 MG PO TABS
81.0000 mg | ORAL_TABLET | Freq: Every day | ORAL | Status: DC
Start: 2012-08-15 — End: 2012-08-17

## 2012-08-15 NOTE — Progress Notes (Signed)
PT HERE WITH C/O LOWER RT BACK PAIN RADIATING TO R LEG X 2 DYS. STATES THE PAIN INCREASES WITH USING WALKER. ALSO W/C ASSIST SINCE PAIN 9/10. DENIES FALLS.VSS. A1C DONE.TAKING ALL PRESCRIBED MEDICATION. HEAVINESS ALSO REPORTED ON R SIDE S/P STROKE.

## 2012-08-15 NOTE — Progress Notes (Signed)
Patient ID: [name], male   DOB: November 20, 1955, 57 y.o.   MRN: [id number]  CC: Joint Pains  HPI: [name] is a 39 he is man with extensive medical history as listed below including diabetes mellitus, coronary artery disease, hypertension and recent CVA (Nov 2013) with residual left-sided weakness and now wheelchair bound, came in today complaining of joint pains especially in the right lower leg during and after shower. This started about 3 days ago and she remembered she may have pulled a muscle. The pain is rated about 5/10 is slightly getting better. No joints are, no bruises, and no history of fall. His appetite remained the same as well as other activities of daily. He also needs refill of all his medications. No Known Allergies Past Medical History  Diagnosis Date  . Diabetes mellitus   . Coronary artery disease   . Hypertension   . Gout   . CHF (congestive heart failure)   . Afib   . Arthritis   . Hypertensive emergency 11/19/2010  . Pulmonary edema 11/19/2010  . Acute exacerbation of congestive heart failure 11/19/2010  . Thyroiditis 11/20/2010  . ICH (intracerebral hemorrhage) 12/22/2010  . Stroke 11/19/2010  . Respiratory failure 11/19/2010  . CAD (coronary artery disease) 11/20/2010  . Physical deconditioning 12/22/2010  . Diabetes mellitus 11/19/2010  . A-fib 11/19/2010  . Hyperlipemia 11/21/2010   Current Outpatient Prescriptions on File Prior to Visit  Medication Sig Dispense Refill  . baclofen (LIORESAL) 10 MG tablet Take 0.5 tablets (5 mg total) by mouth 3 (three) times daily.  45 tablet  1  . glucose monitoring kit (FREESTYLE) monitoring kit 1 each by Does not apply route 4 (four) times daily - after meals and at bedtime. 1 month Diabetic Testing Supplies for QAC-QHS accuchecks.  1 each  1  . pantoprazole (PROTONIX) 40 MG tablet Take 1 tablet (40 mg total) by mouth daily at 12 noon.  30 tablet  3  . [DISCONTINUED] insulin glargine (LANTUS) 100 UNIT/ML injection Inject 0.15  mLs (15 Units total) into the skin at bedtime.  12 mL  1  . [DISCONTINUED] potassium chloride (KLOR-CON M15) 15 MEQ tablet Take 2 tablets (30 mEq total) by mouth 2 (two) times daily.  120 tablet  1  . [DISCONTINUED] rosuvastatin (CRESTOR) 5 MG tablet Place 1 tablet (5 mg total) into feeding tube daily at 6 PM.  30 tablet  2   No current facility-administered medications on file prior to visit.   History reviewed. No pertinent family history. History   Social History  . Marital Status: Single    Spouse Name: N/A    Number of Children: N/A  . Years of Education: N/A   Occupational History  . Not on file.   Social History Main Topics  . Smoking status: Former Smoker    Types: Cigarettes    Quit date: 12/19/2005  . Smokeless tobacco: Not on file  . Alcohol Use: 6.6 oz/week    6 Glasses of wine, 0 Cans of beer, 5 Shots of liquor, 0 Drinks containing 0.5 oz of alcohol per week  . Drug Use: No  . Sexually Active:    Other Topics Concern  . Not on file   Social History Narrative  . No narrative on file    Review of Systems: Constitutional: Negative for fever, chills, diaphoresis, activity change, appetite change and fatigue. HENT: Negative for ear pain, nosebleeds, congestion, facial swelling, rhinorrhea, neck pain, neck stiffness and ear discharge.  Eyes:  Negative for pain, discharge, redness, itching and visual disturbance. Respiratory: Negative for cough, choking, chest tightness, shortness of breath, wheezing and stridor.  Cardiovascular: Negative for chest pain, palpitations and leg swelling. Gastrointestinal: Negative for abdominal distention. Genitourinary: Negative for dysuria, urgency, frequency, hematuria, flank pain, decreased urine volume, difficulty urinating and dyspareunia.  Musculoskeletal: Negative for back pain, joint swelling except for the joint pains Neurological: S/P CVA with residual Left sided weakness, on wheelchair  Hematological: Negative for  adenopathy. Does not bruise/bleed easily. Psychiatric/Behavioral: Negative for hallucinations, behavioral problems, confusion, dysphoric mood, decreased concentration and agitation.    Objective:   Filed Vitals:   08/15/12 1122  BP: 101/70  Pulse: 98  Temp: 97.9 F (36.6 C)  Resp: 16    Physical Exam: Constitutional: Patient appears well-developed and well-nourished. No distress. On Wheel chair HENT: Normocephalic, atraumatic, External right and left ear normal. Oropharynx is clear and moist.  Eyes: Conjunctivae and EOM are normal. PERRLA, no scleral icterus. Neck: Normal ROM. Neck supple. No JVD. No tracheal deviation. No thyromegaly. CVS: RRR, S1/S2 +, no murmurs, no gallops, no carotid bruit.  Pulmonary: Effort and breath sounds normal, no stridor, rhonchi, wheezes, rales.  Abdominal: Soft. BS +,  no distension, tenderness, rebound or guarding.  Musculoskeletal: Normal range of motion. No edema and no tenderness. Some joint crepitus noted  Lymphadenopathy: No lymphadenopathy noted, cervical, inguinal or axillary Neuro: Alert. Normal reflexes, muscle tone coordination. No cranial nerve deficit. Skin: Skin is warm and dry. No rash noted. Not diaphoretic. No erythema. No pallor. Psychiatric: Normal mood and affect. Behavior, judgment, thought content normal.  Lab Results  Component Value Date   WBC 6.5 05/03/2012   HGB 14.0 05/03/2012   HCT 40.0 05/03/2012   MCV 87.0 05/03/2012   PLT 222 05/03/2012   Lab Results  Component Value Date   CREATININE 1.33 05/03/2012   BUN 24* 05/03/2012   NA 137 05/03/2012   K 4.2 05/03/2012   CL 99 05/03/2012   CO2 29 05/03/2012    Lab Results  Component Value Date   HGBA1C 6.0 08/15/2012   Lipid Panel     Component Value Date/Time   CHOL 168 05/03/2012 1355   TRIG 114 05/03/2012 1355   HDL 67 05/03/2012 1355   CHOLHDL 2.5 05/03/2012 1355   VLDL 23 05/03/2012 1355   LDLCALC 78 05/03/2012 1355       Assessment and plan:   Patient Active  Problem List   Diagnosis Date Noted  . Diabetes 08/15/2012  . Follow up 08/15/2012  . Pain in limb 08/15/2012  . HTN (hypertension) 07/01/2012  . ICH (intracerebral hemorrhage) 12/22/2010  . Hyperlipemia 11/21/2010  . Gout 11/20/2010  . CAD (coronary artery disease) 11/20/2010  . Stroke 11/19/2010  . Diabetes mellitus 11/19/2010  . A-fib 11/19/2010   Plan:  Patient's medications were refilled  Naproxen 500 mg twice a day was prescribed for patient for pain in the joints  Referral was placed for physical therapy  Resources for commode and handicapped bathroom access explored and prescribed  Hemoglobin A1c today is 6.0  Patient was counseled on blood pressure and blood sugar check  Patient is to followup in 2 months       Jeanann Lewandowsky, MD [facility] [place], [place] [phone]   08/15/2012, 12:20 PM

## 2012-08-17 ENCOUNTER — Encounter (HOSPITAL_COMMUNITY): Payer: Self-pay | Admitting: Adult Health

## 2012-08-17 ENCOUNTER — Encounter (HOSPITAL_COMMUNITY): Payer: Self-pay | Admitting: Emergency Medicine

## 2012-08-17 ENCOUNTER — Emergency Department (INDEPENDENT_AMBULATORY_CARE_PROVIDER_SITE_OTHER)
Admission: EM | Admit: 2012-08-17 | Discharge: 2012-08-17 | Disposition: A | Discharge status: Nursing Home (Includes ICF) | Payer: Medicaid Other | Source: Home / Self Care

## 2012-08-17 ENCOUNTER — Emergency Department (HOSPITAL_COMMUNITY): Payer: Medicaid Other

## 2012-08-17 ENCOUNTER — Observation Stay (HOSPITAL_COMMUNITY)
Admission: EM | Admit: 2012-08-17 | Discharge: 2012-08-20 | Disposition: A | Discharge status: Home Health | Payer: Medicaid Other | Attending: Internal Medicine | Admitting: Internal Medicine

## 2012-08-17 DIAGNOSIS — E119 Type 2 diabetes mellitus without complications: Secondary | ICD-10-CM | POA: Insufficient documentation

## 2012-08-17 DIAGNOSIS — I69959 Hemiplegia and hemiparesis following unspecified cerebrovascular disease affecting unspecified side: Principal | ICD-10-CM | POA: Insufficient documentation

## 2012-08-17 DIAGNOSIS — I639 Cerebral infarction, unspecified: Secondary | ICD-10-CM

## 2012-08-17 DIAGNOSIS — N182 Chronic kidney disease, stage 2 (mild): Secondary | ICD-10-CM | POA: Insufficient documentation

## 2012-08-17 DIAGNOSIS — Z8673 Personal history of transient ischemic attack (TIA), and cerebral infarction without residual deficits: Secondary | ICD-10-CM | POA: Diagnosis present

## 2012-08-17 DIAGNOSIS — Z09 Encounter for follow-up examination after completed treatment for conditions other than malignant neoplasm: Secondary | ICD-10-CM

## 2012-08-17 DIAGNOSIS — I4891 Unspecified atrial fibrillation: Secondary | ICD-10-CM

## 2012-08-17 DIAGNOSIS — I619 Nontraumatic intracerebral hemorrhage, unspecified: Secondary | ICD-10-CM

## 2012-08-17 DIAGNOSIS — I69993 Ataxia following unspecified cerebrovascular disease: Secondary | ICD-10-CM | POA: Insufficient documentation

## 2012-08-17 DIAGNOSIS — I509 Heart failure, unspecified: Secondary | ICD-10-CM | POA: Insufficient documentation

## 2012-08-17 DIAGNOSIS — I10 Essential (primary) hypertension: Secondary | ICD-10-CM | POA: Diagnosis present

## 2012-08-17 DIAGNOSIS — M79609 Pain in unspecified limb: Secondary | ICD-10-CM

## 2012-08-17 DIAGNOSIS — I4821 Permanent atrial fibrillation: Secondary | ICD-10-CM | POA: Diagnosis present

## 2012-08-17 DIAGNOSIS — M109 Gout, unspecified: Secondary | ICD-10-CM

## 2012-08-17 DIAGNOSIS — M6281 Muscle weakness (generalized): Secondary | ICD-10-CM

## 2012-08-17 DIAGNOSIS — M25559 Pain in unspecified hip: Secondary | ICD-10-CM | POA: Insufficient documentation

## 2012-08-17 DIAGNOSIS — I129 Hypertensive chronic kidney disease with stage 1 through stage 4 chronic kidney disease, or unspecified chronic kidney disease: Secondary | ICD-10-CM | POA: Insufficient documentation

## 2012-08-17 DIAGNOSIS — Z79899 Other long term (current) drug therapy: Secondary | ICD-10-CM | POA: Insufficient documentation

## 2012-08-17 DIAGNOSIS — R262 Difficulty in walking, not elsewhere classified: Secondary | ICD-10-CM | POA: Insufficient documentation

## 2012-08-17 DIAGNOSIS — I2510 Atherosclerotic heart disease of native coronary artery without angina pectoris: Secondary | ICD-10-CM | POA: Diagnosis present

## 2012-08-17 DIAGNOSIS — R29810 Facial weakness: Secondary | ICD-10-CM | POA: Insufficient documentation

## 2012-08-17 DIAGNOSIS — I6350 Cerebral infarction due to unspecified occlusion or stenosis of unspecified cerebral artery: Secondary | ICD-10-CM

## 2012-08-17 DIAGNOSIS — E785 Hyperlipidemia, unspecified: Secondary | ICD-10-CM | POA: Diagnosis present

## 2012-08-17 HISTORY — DX: Shortness of breath: R06.02

## 2012-08-17 LAB — PROTIME-INR: Prothrombin Time: 13 seconds (ref 11.6–15.2)

## 2012-08-17 LAB — CBC WITH DIFFERENTIAL/PLATELET
Eosinophils Relative: 1 % (ref 0–5)
HCT: 40.5 % (ref 39.0–52.0)
Hemoglobin: 14.3 g/dL (ref 13.0–17.0)
Lymphocytes Relative: 51 % — ABNORMAL HIGH (ref 12–46)
Lymphs Abs: 2.9 10*3/uL (ref 0.7–4.0)
MCV: 88.6 fL (ref 78.0–100.0)
Monocytes Absolute: 0.5 10*3/uL (ref 0.1–1.0)
Monocytes Relative: 8 % (ref 3–12)
Platelets: 211 10*3/uL (ref 150–400)
RBC: 4.57 MIL/uL (ref 4.22–5.81)
WBC: 5.7 10*3/uL (ref 4.0–10.5)

## 2012-08-17 LAB — BASIC METABOLIC PANEL
CO2: 25 mEq/L (ref 19–32)
Calcium: 10.1 mg/dL (ref 8.4–10.5)
Glucose, Bld: 110 mg/dL — ABNORMAL HIGH (ref 70–99)
Sodium: 137 mEq/L (ref 135–145)

## 2012-08-17 LAB — GLUCOSE, CAPILLARY

## 2012-08-17 LAB — CBC
Hemoglobin: 14.2 g/dL (ref 13.0–17.0)
MCH: 31.6 pg (ref 26.0–34.0)
MCHC: 35.9 g/dL (ref 30.0–36.0)
Platelets: 234 10*3/uL (ref 150–400)
RDW: 13.9 % (ref 11.5–15.5)

## 2012-08-17 LAB — CREATININE, SERUM: Creatinine, Ser: 1.21 mg/dL (ref 0.50–1.35)

## 2012-08-17 MED ORDER — ISOSORB DINITRATE-HYDRALAZINE 20-37.5 MG PO TABS
2.0000 | ORAL_TABLET | Freq: Three times a day (TID) | ORAL | Status: DC
  Administered 2012-08-18 – 2012-08-20 (×9): 2 via ORAL
  Filled 2012-08-17 (×11): qty 2

## 2012-08-17 MED ORDER — ALLOPURINOL 100 MG PO TABS
100.0000 mg | ORAL_TABLET | Freq: Every day | ORAL | Status: DC
  Administered 2012-08-18 – 2012-08-20 (×3): 100 mg via ORAL
  Filled 2012-08-17 (×3): qty 1

## 2012-08-17 MED ORDER — CARVEDILOL 25 MG PO TABS
25.0000 mg | ORAL_TABLET | Freq: Two times a day (BID) | ORAL | Status: DC
  Administered 2012-08-18 – 2012-08-20 (×6): 25 mg via ORAL
  Filled 2012-08-17 (×8): qty 1

## 2012-08-17 MED ORDER — RAMIPRIL 10 MG PO CAPS
10.0000 mg | ORAL_CAPSULE | Freq: Two times a day (BID) | ORAL | Status: DC
  Administered 2012-08-18 – 2012-08-20 (×6): 10 mg via ORAL
  Filled 2012-08-17 (×8): qty 1

## 2012-08-17 MED ORDER — DOXAZOSIN MESYLATE 2 MG PO TABS
2.0000 mg | ORAL_TABLET | Freq: Every day | ORAL | Status: DC
  Administered 2012-08-18 – 2012-08-19 (×3): 2 mg via ORAL
  Filled 2012-08-17 (×5): qty 1

## 2012-08-17 MED ORDER — INSULIN ASPART 100 UNIT/ML ~~LOC~~ SOLN
0.0000 [IU] | Freq: Three times a day (TID) | SUBCUTANEOUS | Status: DC
  Administered 2012-08-19: 1 [IU] via SUBCUTANEOUS

## 2012-08-17 MED ORDER — DILTIAZEM HCL ER COATED BEADS 180 MG PO TB24
360.0000 mg | ORAL_TABLET | Freq: Every day | ORAL | Status: DC
  Administered 2012-08-19: 360 mg via ORAL
  Filled 2012-08-17 (×6): qty 2

## 2012-08-17 MED ORDER — ASPIRIN EC 81 MG PO TBEC
81.0000 mg | DELAYED_RELEASE_TABLET | Freq: Every day | ORAL | Status: DC
  Administered 2012-08-18 – 2012-08-20 (×3): 81 mg via ORAL
  Filled 2012-08-17 (×3): qty 1

## 2012-08-17 MED ORDER — HEPARIN SODIUM (PORCINE) 5000 UNIT/ML IJ SOLN
5000.0000 [IU] | Freq: Three times a day (TID) | INTRAMUSCULAR | Status: DC
  Administered 2012-08-18 – 2012-08-20 (×9): 5000 [IU] via SUBCUTANEOUS
  Filled 2012-08-17 (×13): qty 1

## 2012-08-17 MED ORDER — SIMVASTATIN 10 MG PO TABS
10.0000 mg | ORAL_TABLET | Freq: Every day | ORAL | Status: DC
  Administered 2012-08-18 – 2012-08-20 (×3): 10 mg via ORAL
  Filled 2012-08-17 (×3): qty 1

## 2012-08-17 MED ORDER — INSULIN ASPART 100 UNIT/ML ~~LOC~~ SOLN: 0.0000 [IU] | SUBCUTANEOUS | Status: DC

## 2012-08-17 MED ORDER — SPIRONOLACTONE 25 MG PO TABS
25.0000 mg | ORAL_TABLET | Freq: Every day | ORAL | Status: DC
  Administered 2012-08-18 – 2012-08-20 (×3): 25 mg via ORAL
  Filled 2012-08-17 (×3): qty 1

## 2012-08-17 NOTE — ED Notes (Signed)
No changes, up on monitor with teletech.

## 2012-08-17 NOTE — ED Notes (Signed)
Pt alert, NAD, calm, interactive, resps e/u, speaking in clear complete sentences, (denies: pain, nv, HA, dizziness, sob or other sx), "feel the same as on arrival".

## 2012-08-17 NOTE — H&P (Signed)
[facility] History and Physical  LOVE MILBOURNE ZOX:[id number] DOB: [birth year]    PCP:   Standley Dakins, MD   Chief Complaint: trouble walking and increased right sided weakness.  HPI: [name] is an 57 y.o. male with hx of afib, previously on coumadin but discontinued as he has had ICH (2012), prior CVA with residual right sided weakness, HTN, CHF, DM, Hyperlipedemia, presents to the ER after having increased right sided weakness, causing him difficulty with walking.  He denied slurred speech, but stated that he noted a right facial droop as well.  Evaluation in the ER included a head CT with no acute process, a Cr of 1.4, but otherwise unremarkable serology.  EDP consulted neurology, and hospitalist was asked to admit him for a subacute CVA probably 2 days ago.    Rewiew of Systems:  Constitutional: Negative for malaise, fever and chills. No significant weight loss or weight gain Eyes: Negative for eye pain, redness and discharge, diplopia, visual changes, or flashes of light. ENMT: Negative for ear pain, hoarseness, nasal congestion, sinus pressure and sore throat. No headaches; tinnitus, drooling, or problem swallowing. Cardiovascular: Negative for chest pain, palpitations, diaphoresis, dyspnea and peripheral edema. ; No orthopnea, PND Respiratory: Negative for cough, hemoptysis, wheezing and stridor. No pleuritic chestpain. Gastrointestinal: Negative for nausea, vomiting, diarrhea, constipation, abdominal pain, melena, blood in stool, hematemesis, jaundice and rectal bleeding.    Genitourinary: Negative for frequency, dysuria, incontinence,flank pain and hematuria; Musculoskeletal: Negative for back pain and neck pain. Negative for swelling and trauma.;  Skin: . Negative for pruritus, rash, abrasions, bruising and skin lesion.; ulcerations Neuro: Negative for headache, lightheadedness and neck stiffness. Negative for weakness, altered level of consciousness , altered mental  status,  burning feet, involuntary movement, seizure and syncope.  Psych: negative for anxiety, depression, insomnia, tearfulness, panic attacks, hallucinations, paranoia, suicidal or homicidal ideation    Past Medical History  Diagnosis Date  . Diabetes mellitus   . Coronary artery disease   . Hypertension   . Gout   . CHF (congestive heart failure)   . Afib   . Arthritis   . Hypertensive emergency 11/19/2010  . Pulmonary edema 11/19/2010  . Acute exacerbation of congestive heart failure 11/19/2010  . Thyroiditis 11/20/2010  . ICH (intracerebral hemorrhage) 12/22/2010  . Stroke 11/19/2010  . Respiratory failure 11/19/2010  . CAD (coronary artery disease) 11/20/2010  . Physical deconditioning 12/22/2010  . Diabetes mellitus 11/19/2010  . A-fib 11/19/2010  . Hyperlipemia 11/21/2010    Past Surgical History  Procedure Laterality Date  . Tracheostomy tube placement  11/28/2010    Procedure: TRACHEOSTOMY;  Surgeon: Susy Frizzle, MD;  Location: [facility] OR;  Service: ENT;  Laterality: N/A;  . Peg placement  12/03/2010    Procedure: PERCUTANEOUS ENDOSCOPIC GASTROSTOMY (PEG) PLACEMENT;  Surgeon: Hart Carwin, MD;  Location[facility] ENDOSCOPY;  Service: Endoscopy;  Laterality: N/A;    Medications:  HOME MEDS: Prior to Admission medications   Medication Sig Start Date End Date Taking? Authorizing Provider  allopurinol (ZYLOPRIM) 100 MG tablet Take 1 tablet (100 mg total) by mouth daily. 08/15/12  Yes Jeanann Lewandowsky, MD  aspirin 81 MG tablet Take 81 mg by mouth daily at 12 noon. 08/15/12  Yes Jeanann Lewandowsky, MD  baclofen (LIORESAL) 10 MG tablet Take 0.5 tablets (5 mg total) by mouth 3 (three) times daily. 07/01/12  Yes Alison Murray, MD  carvedilol (COREG) 25 MG tablet Take 1 tablet (25 mg total) by mouth 2 (two)  times daily with a meal. 08/15/12  Yes Jeanann Lewandowsky, MD  diltiazem (CARDIZEM LA) 180 MG 24 hr tablet Take 2 tablets (360 mg total) by mouth daily at 12 noon. 08/15/12 08/15/13 Yes Jeanann Lewandowsky, MD  doxazosin (CARDURA) 2 MG tablet Take 1 tablet (2 mg total) by mouth at bedtime. 08/15/12 08/15/13 Yes Jeanann Lewandowsky, MD  isosorbide-hydrALAZINE (BIDIL) 20-37.5 MG per tablet Take 2 tablets by mouth 3 (three) times daily. 08/15/12  Yes Jeanann Lewandowsky, MD  metFORMIN (GLUCOPHAGE) 850 MG tablet Take 1 tablet (850 mg total) by mouth 2 (two) times daily with a meal. 08/15/12  Yes Jeanann Lewandowsky, MD  naproxen (NAPROSYN) 500 MG tablet Take 500 mg by mouth 2 (two) times daily as needed. For pain/inflammation 08/15/12  Yes Jeanann Lewandowsky, MD  pantoprazole (PROTONIX) 40 MG tablet Take 1 tablet (40 mg total) by mouth daily at 12 noon. 07/01/12 07/01/13 Yes Alison Murray, MD  pravastatin (PRAVACHOL) 20 MG tablet Take 20 mg by mouth every morning. 08/15/12  Yes Jeanann Lewandowsky, MD  ramipril (ALTACE) 10 MG capsule Take 1 capsule (10 mg total) by mouth 2 (two) times daily. 08/15/12 08/15/13 Yes Jeanann Lewandowsky, MD  spironolactone (ALDACTONE) 25 MG tablet Take 1 tablet (25 mg total) by mouth daily. 08/15/12  Yes Jeanann Lewandowsky, MD  glucose monitoring kit (FREESTYLE) monitoring kit 1 each by Does not apply route 4 (four) times daily - after meals and at bedtime. 1 month Diabetic Testing Supplies for QAC-QHS accuchecks. 07/01/12   Alison Murray, MD     Allergies:  No Known Allergies  Social History:   reports that he quit smoking about 6 years ago. His smoking use included Cigarettes. He smoked 0.00 packs per day. He does not have any smokeless tobacco history on file. He reports that he drinks about 6.6 ounces of alcohol per week. He reports that he does not use illicit drugs.  Family History: History reviewed. No pertinent family history.   Physical Exam: Filed Vitals:   08/17/12 2030 08/17/12 2045 08/17/12 2100 08/17/12 2115  BP: 125/91 138/114 135/96 134/98  Pulse: 53  80 67  Temp:      TempSrc:      Resp: 17 19 17 17   SpO2: 96%  94% 96%   Blood pressure 134/98, pulse 67, temperature  98.5 F (36.9 C), temperature source Oral, resp. rate 17, SpO2 96.00%.  GEN:  Pleasant  patient lying in the stretcher in no acute distress; cooperative with exam. PSYCH:  alert and oriented x4; does not appear anxious or depressed; affect is appropriate. HEENT: Mucous membranes pink and anicteric; PERRLA; EOM intact; no cervical lymphadenopathy nor thyromegaly or carotid bruit; no JVD; There were no stridor. Neck is very supple. Breasts:: Not examined CHEST WALL: No tenderness CHEST: Normal respiration, clear to auscultation bilaterally.  HEART: Regular rate and rhythm.  There are no murmur, rub, or gallops.   BACK: No kyphosis or scoliosis; no CVA tenderness ABDOMEN: soft and non-tender; no masses, no organomegaly, normal abdominal bowel sounds; no pannus; no intertriginous candida. There is no rebound and no distention. Rectal Exam: Not done EXTREMITIES: No bone or joint deformity; age-appropriate arthropathy of the hands and knees; no edema; no ulcerations.  There is no calf tenderness. Genitalia: not examined PULSES: 2+ and symmetric SKIN: Normal hydration no rash or ulceration CNS: Cranial nerves 2-12 grossly intact no focal lateralizing neurologic deficit.  Speech is fluent; uvula elevated with phonation, facial symmetry and tongue midline. DTR are normal bilaterally,  cerebella exam is intact, barbinski is negative and right side is weaker than left.   No sensory loss.  His visual field is full to confrontation.   Labs on Admission:  Basic Metabolic Panel:  Recent Labs Lab 08/17/12 1833  NA 137  K 4.5  CL 103  CO2 25  GLUCOSE 110*  BUN 27*  CREATININE 1.40*  CALCIUM 10.1   Liver Function Tests: No results found for this basename: AST, ALT, ALKPHOS, BILITOT, PROT, ALBUMIN,  in the last 168 hours No results found for this basename: LIPASE, AMYLASE,  in the last 168 hours No results found for this basename: AMMONIA,  in the last 168 hours CBC:  Recent Labs Lab  08/17/12 1833  WBC 5.7  NEUTROABS 2.3  HGB 14.3  HCT 40.5  MCV 88.6  PLT 211   Cardiac Enzymes: No results found for this basename: CKTOTAL, CKMB, CKMBINDEX, TROPONINI,  in the last 168 hours  CBG: No results found for this basename: GLUCAP,  in the last 168 hours   Radiological Exams on Admission: Ct Head Wo Contrast  08/17/2012   *RADIOLOGY REPORT*  Clinical Data: Extremity weakness  CT HEAD WITHOUT CONTRAST  Technique:  Contiguous axial images were obtained from the base of the skull through the vertex without contrast.  Comparison: 12/07/2010  Findings: No skull fracture is noted.  Paranasal sinuses and mastoid air cells are unremarkable.  There is encephalomalacia in the right cerebellum from prior infarct.  No intracranial hemorrhage, mass effect or midline shift.  No acute cortical infarction.  No mass lesion is noted on this unenhanced scan. Stable mild cerebral atrophy.  Ventricular size is stable from prior exam.  The stable mild periventricular chronic white matter disease.  IMPRESSION: No acute intracranial abnormality.  No definite acute cortical infarction.  There is encephalomalacia from prior infarct in the right cerebellum.   Original Report Authenticated By: Natasha Mead, M.D.    Assessment/Plan Present on Admission:  . Stroke . Diabetes mellitus . CAD (coronary artery disease) . A-fib . HTN (hypertension) . Hyperlipemia  PLAN:  Will admit him for likely subacute CVA 2-3 days ago.  Although he had residual right sided weakness, this was a definite and acute change.  Will obtain MRI of the brain, along with ECHO and doppler of the carotids as his prior stroke work up was over 2 years ago.  He has afib and was fully anticoagulated before, but no longer since his ICH 2 years ago.  He is currently on ASA.  I will continue this, though wonder if he should be on Plavix in addition.  The risks are with increased bleeding (his ICH was over 2 years ago now) and delaying any type of  surgery he may acutely need, the benefit is clearly with afib and prior CVA, now with likely a subacute CVA, he would be receiving dual antiplatelet therapy instead of coumadin.  There is no urgency to start him on Plavix tonight, so I will defer to the day team and neurology.  He is otherwise stable, full code, and will be admitted to [facility] service.  Thank you for allowing me to partake in the care of this nice patient.  Other plans as per orders.  Code Status: FULL Unk Lightning, MD. [facility] Pager [phone] 7pm to 7am.  08/17/2012, 9:52 PM

## 2012-08-17 NOTE — ED Provider Notes (Signed)
CSN: [id number]     Arrival date & time 08/17/12  1510 History     First MD Initiated Contact with Patient 08/17/12 1524     Chief Complaint  Patient presents with  . Leg Pain   (Consider location/radiation/quality/duration/timing/severity/associated sxs/prior Treatment) Patient is a 57 y.o. male presenting with leg pain. The history is provided by the patient and the spouse.  Leg Pain Location:  Leg Time since incident:  3 days Leg location:  R upper leg Pain details:    Quality:  Burning   Radiates to:  Does not radiate   Severity:  Moderate   Onset quality:  Sudden   Duration:  3 days   Timing:  Constant   Progression:  Worsening Chronicity:  New Dislocation: no   Relieved by:  Nothing Ineffective treatments:  NSAIDs Associated symptoms: back pain and muscle weakness   Risk factors comment:  H/o cva   Past Medical History  Diagnosis Date  . Diabetes mellitus   . Coronary artery disease   . Hypertension   . Gout   . CHF (congestive heart failure)   . Afib   . Arthritis   . Hypertensive emergency 11/19/2010  . Pulmonary edema 11/19/2010  . Acute exacerbation of congestive heart failure 11/19/2010  . Thyroiditis 11/20/2010  . ICH (intracerebral hemorrhage) 12/22/2010  . Stroke 11/19/2010  . Respiratory failure 11/19/2010  . CAD (coronary artery disease) 11/20/2010  . Physical deconditioning 12/22/2010  . Diabetes mellitus 11/19/2010  . A-fib 11/19/2010  . Hyperlipemia 11/21/2010   Past Surgical History  Procedure Laterality Date  . Tracheostomy tube placement  11/28/2010    Procedure: TRACHEOSTOMY;  Surgeon: Susy Frizzle, MD;  Location: [facility] OR;  Service: ENT;  Laterality: N/A;  . Peg placement  12/03/2010    Procedure: PERCUTANEOUS ENDOSCOPIC GASTROSTOMY (PEG) PLACEMENT;  Surgeon: Hart Carwin, MD;  Location: [facility] ENDOSCOPY;  Service: Endoscopy;  Laterality: N/A;   No family history on file. History  Substance Use Topics  . Smoking status: Former Smoker    Types:  Cigarettes    Quit date: 12/19/2005  . Smokeless tobacco: Not on file  . Alcohol Use: 6.6 oz/week    6 Glasses of wine, 0 Cans of beer, 5 Shots of liquor, 0 Drinks containing 0.5 oz of alcohol per week    Review of Systems  Constitutional: Negative.   Gastrointestinal: Negative.   Genitourinary: Negative.   Musculoskeletal: Positive for myalgias, back pain and gait problem. Negative for joint swelling.  Skin: Negative.     Allergies  Review of patient's allergies indicates no known allergies.  Home Medications   Current Outpatient Rx  Name  Route  Sig  Dispense  Refill  . allopurinol (ZYLOPRIM) 100 MG tablet   Oral   Take 1 tablet (100 mg total) by mouth daily.   30 tablet   3     To prevent gout flareup   . aspirin 81 MG tablet   Oral   Take 1 tablet (81 mg total) by mouth daily.   30 tablet   1   . baclofen (LIORESAL) 10 MG tablet   Oral   Take 0.5 tablets (5 mg total) by mouth 3 (three) times daily.   45 tablet   1     To prevent hiccups.  In a few days try to taper to ...   . carvedilol (COREG) 25 MG tablet   Oral   Take 1 tablet (25 mg total) by mouth 2 (two)  times daily with a meal.   60 tablet   3   . diltiazem (CARDIZEM LA) 180 MG 24 hr tablet   Oral   Take 2 tablets (360 mg total) by mouth daily at 12 noon.   60 tablet   3   . doxazosin (CARDURA) 2 MG tablet   Oral   Take 1 tablet (2 mg total) by mouth at bedtime.   30 tablet   3   . glucose monitoring kit (FREESTYLE) monitoring kit   Does not apply   1 each by Does not apply route 4 (four) times daily - after meals and at bedtime. 1 month Diabetic Testing Supplies for QAC-QHS accuchecks.   1 each   1   . isosorbide-hydrALAZINE (BIDIL) 20-37.5 MG per tablet   Oral   Take 2 tablets by mouth 3 (three) times daily.   90 tablet   3     For heart.   . metFORMIN (GLUCOPHAGE) 850 MG tablet   Oral   Take 1 tablet (850 mg total) by mouth 2 (two) times daily with a meal.   60 tablet   3    . naproxen (NAPROSYN) 500 MG tablet   Oral   Take 1 tablet (500 mg total) by mouth 2 (two) times daily with a meal.   30 tablet   0   . pantoprazole (PROTONIX) 40 MG tablet   Oral   Take 1 tablet (40 mg total) by mouth daily at 12 noon.   30 tablet   3   . pravastatin (PRAVACHOL) 20 MG tablet   Oral   Take 1 tablet (20 mg total) by mouth daily.   30 tablet   3   . ramipril (ALTACE) 10 MG capsule   Oral   Take 1 capsule (10 mg total) by mouth 2 (two) times daily.   30 capsule   3     For blood pressure.   Marland Kitchen spironolactone (ALDACTONE) 25 MG tablet   Oral   Take 1 tablet (25 mg total) by mouth daily.   30 tablet   3    BP 119/89  Pulse 94  Temp(Src) 98.5 F (36.9 C) (Oral)  Resp 24  SpO2 100% Physical Exam  Nursing note and vitals reviewed. Constitutional: He is oriented to person, place, and time. He appears well-developed and well-nourished.  Abdominal: Soft. Bowel sounds are normal. There is no tenderness.  Musculoskeletal: He exhibits tenderness. He exhibits no edema.  Right thigh soreness, neg slr, pulses 2+, no apparent weakness.  Neurological: He is alert and oriented to person, place, and time.  Skin: Skin is warm and dry.    ED Course   Procedures (including critical care time)  Labs Reviewed - No data to display No results found. 1. Muscle weakness of lower extremity     MDM  Sent for eval of right leg weakness.h/o cva, recent back pain.  Linna Hoff, MD 08/17/12 [phone]

## 2012-08-17 NOTE — ED Notes (Signed)
Wife waiting with patient

## 2012-08-17 NOTE — ED Notes (Signed)
MD at bedside. 

## 2012-08-17 NOTE — ED Notes (Signed)
Pt not in room, pt in CT.  

## 2012-08-17 NOTE — ED Notes (Signed)
Patient transported to CT 

## 2012-08-17 NOTE — ED Provider Notes (Signed)
CSN: [id number]     Arrival date & time 08/17/12  1739 History     First MD Initiated Contact with Patient 08/17/12 1820     Chief Complaint  Patient presents with  . Extremity Weakness   (Consider location/radiation/quality/duration/timing/severity/associated sxs/prior Treatment) Patient is a 57 y.o. male presenting with extremity weakness. The history is provided by the patient.  Extremity Weakness This is a new problem. Episode onset: 2 days ago. The problem occurs constantly. The problem has been unchanged. Associated symptoms include weakness. Pertinent negatives include no abdominal pain, arthralgias, chest pain, chills, coughing, diaphoresis, fatigue, fever, headaches, joint swelling, myalgias, nausea, neck pain, numbness, rash, urinary symptoms, vertigo, visual change or vomiting. Nothing aggravates the symptoms. He has tried nothing for the symptoms. The treatment provided no relief.    Past Medical History  Diagnosis Date  . Diabetes mellitus   . Coronary artery disease   . Hypertension   . Gout   . CHF (congestive heart failure)   . Afib   . Arthritis   . Hypertensive emergency 11/19/2010  . Pulmonary edema 11/19/2010  . Acute exacerbation of congestive heart failure 11/19/2010  . Thyroiditis 11/20/2010  . ICH (intracerebral hemorrhage) 12/22/2010  . Stroke 11/19/2010  . Respiratory failure 11/19/2010  . CAD (coronary artery disease) 11/20/2010  . Physical deconditioning 12/22/2010  . Diabetes mellitus 11/19/2010  . A-fib 11/19/2010  . Hyperlipemia 11/21/2010   Past Surgical History  Procedure Laterality Date  . Tracheostomy tube placement  11/28/2010    Procedure: TRACHEOSTOMY;  Surgeon: Susy Frizzle, MD;  Location: [facility] OR;  Service: ENT;  Laterality: N/A;  . Peg placement  12/03/2010    Procedure: PERCUTANEOUS ENDOSCOPIC GASTROSTOMY (PEG) PLACEMENT;  Surgeon: Hart Carwin, MD;  Location: [facility] ENDOSCOPY;  Service: Endoscopy;  Laterality: N/A;   History reviewed. No  pertinent family history. History  Substance Use Topics  . Smoking status: Former Smoker    Types: Cigarettes    Quit date: 12/19/2005  . Smokeless tobacco: Not on file  . Alcohol Use: 6.6 oz/week    6 Glasses of wine, 0 Cans of beer, 5 Shots of liquor, 0 Drinks containing 0.5 oz of alcohol per week    Review of Systems  Constitutional: Negative for fever, chills, diaphoresis, appetite change and fatigue.  HENT: Negative for neck pain and neck stiffness.   Eyes: Negative for photophobia and visual disturbance.  Respiratory: Negative for cough, chest tightness, shortness of breath and wheezing.   Cardiovascular: Negative for chest pain, palpitations and leg swelling.  Gastrointestinal: Negative for nausea, vomiting, abdominal pain and abdominal distention.  Genitourinary: Negative for dysuria and difficulty urinating.  Musculoskeletal: Positive for gait problem (2/2 R-sided weakness) and extremity weakness. Negative for myalgias, back pain, joint swelling and arthralgias.  Skin: Negative for color change, pallor and rash.  Neurological: Positive for facial asymmetry and weakness. Negative for dizziness, vertigo, syncope, speech difficulty, light-headedness, numbness and headaches.  All other systems reviewed and are negative.    Allergies  Review of patient's allergies indicates no known allergies.  Home Medications   No current outpatient prescriptions on file. BP 134/98  Pulse 67  Temp(Src) 98.5 F (36.9 C) (Oral)  Resp 17  SpO2 96% Physical Exam  Nursing note and vitals reviewed. Constitutional: He is oriented to person, place, and time. He appears well-developed and well-nourished. No distress.  HENT:  Head: Normocephalic and atraumatic.  Eyes: EOM are normal. Pupils are equal, round, and reactive to light.  Neck: Normal  range of motion. Neck supple.  Cardiovascular: Normal rate, regular rhythm, normal heart sounds and intact distal pulses.   Pulmonary/Chest: Effort  normal and breath sounds normal. No respiratory distress. He has no wheezes. He has no rales.  Abdominal: Soft. Bowel sounds are normal. He exhibits no distension. There is no tenderness. There is no rebound and no guarding.  Musculoskeletal: Normal range of motion. He exhibits no edema and no tenderness.  Neurological: He is alert and oriented to person, place, and time. A cranial nerve deficit (L-sided facial droop) is present. No sensory deficit. He exhibits normal muscle tone. He displays a negative Romberg sign. Coordination normal. GCS eye subscore is 4. GCS verbal subscore is 5. GCS motor subscore is 6.  RUE and RLE strength 3/5 compared to 5/5 on L side. L-sided facial extremity numbness  Skin: Skin is warm and dry. No rash noted. He is not diaphoretic. No erythema. No pallor.    ED Course   Procedures (including critical care time)  Labs Reviewed  CBC WITH DIFFERENTIAL - Abnormal; Notable for the following:    Neutrophils Relative % 40 (*)    Lymphocytes Relative 51 (*)    All other components within normal limits  BASIC METABOLIC PANEL - Abnormal; Notable for the following:    Glucose, Bld 110 (*)    BUN 27 (*)    Creatinine, Ser 1.40 (*)    GFR calc non Af Amer 54 (*)    GFR calc Af Amer 63 (*)    All other components within normal limits  CREATININE, SERUM - Abnormal; Notable for the following:    GFR calc non Af Amer 64 (*)    GFR calc Af Amer 75 (*)    All other components within normal limits  APTT  PROTIME-INR  CBC  GLUCOSE, CAPILLARY  HEMOGLOBIN A1C  LIPID PANEL  URINE RAPID DRUG SCREEN (HOSP PERFORMED)   Ct Head Wo Contrast  08/17/2012   *RADIOLOGY REPORT*  Clinical Data: Extremity weakness  CT HEAD WITHOUT CONTRAST  Technique:  Contiguous axial images were obtained from the base of the skull through the vertex without contrast.  Comparison: 12/07/2010  Findings: No skull fracture is noted.  Paranasal sinuses and mastoid air cells are unremarkable.  There is  encephalomalacia in the right cerebellum from prior infarct.  No intracranial hemorrhage, mass effect or midline shift.  No acute cortical infarction.  No mass lesion is noted on this unenhanced scan. Stable mild cerebral atrophy.  Ventricular size is stable from prior exam.  The stable mild periventricular chronic white matter disease.  IMPRESSION: No acute intracranial abnormality.  No definite acute cortical infarction.  There is encephalomalacia from prior infarct in the right cerebellum.   Original Report Authenticated By: Natasha Mead, M.D.   1. Diabetes   2. A-fib   3. HTN (hypertension)   4. Stroke      Date: 08/17/2012  Rate: 59  Rhythm: atrial fibrillation  QRS Axis: normal  Intervals: normal  ST/T Wave abnormalities: ST depressions anteriorly  Conduction Disutrbances:none  Narrative Interpretation:   Old EKG Reviewed: unchanged   MDM  57 y.o. M with a PMH of CVA with residual R-sided weakness presenting with worsening of R-sided weakness as well as new left-sided numbness.  Symptom onset 2 days ago.  Pt states he has been falling more frequently since that time due to R-sided weakness despite using walker.  Pt denies head injury or LOC, states he falls to his knees.  He  denies HA, chest pain,dyspnea, lightheadedness, nausea, vomiting.    On exam, pt AFVSS. Physical exam notable for new left-sided facial droop and facial numbness, RUE and RLE weakness which pt states is worse than baseline.  CT head, EKG, and labs obtained.  Pt is not on anti-coagulation.  CT head shows no acute changes.  Lab results WNL.  EKG as above, not significantly changed from prior studies.  Discussed with Neurology, pt likely had new CVA.  Pt admitted to Medicine with Neurology in consultation.  Stable at time of admission.  Discussed with attending Dr. Andrey Farmer, MD 08/18/12 [phone]

## 2012-08-17 NOTE — Consult Note (Signed)
Referring Physician: Dr. Conley Rolls    Chief Complaint: Acute exacerbation of right hemiparesis.  HPI: [name] is an 57 y.o. male history of previous stroke with residual right hemiparesis, hypertension, hyperlipidemia, diabetes mellitus, coronary artery disease, atrial fibrillation and congestive heart failure, presenting with acute onset of worsening of weakness involving his right side. Onset was 2 days prior to presentation. He said increased difficulty with walking as well as with use of his right upper extremity. He said no changes in speech or swallowing. Facial droop has become more noticeable. CT scan of his head showed no acute intracranial abnormality. Patient had a hemorrhagic stroke in 2012, following which Coumadin was discontinued. His been on aspirin 81 mg per day. NIH stroke score was 4.  LSN: 08/15/2012 tPA Given: No: Beyond time window for treatment consideration MRankin: 2  Past Medical History  Diagnosis Date  . Diabetes mellitus   . Coronary artery disease   . Hypertension   . Gout   . CHF (congestive heart failure)   . Afib   . Arthritis   . Hypertensive emergency 11/19/2010  . Pulmonary edema 11/19/2010  . Acute exacerbation of congestive heart failure 11/19/2010  . Thyroiditis 11/20/2010  . ICH (intracerebral hemorrhage) 12/22/2010  . Stroke 11/19/2010  . Respiratory failure 11/19/2010  . CAD (coronary artery disease) 11/20/2010  . Physical deconditioning 12/22/2010  . Diabetes mellitus 11/19/2010  . A-fib 11/19/2010  . Hyperlipemia 11/21/2010    History reviewed. No pertinent family history.   Medications: I have reviewed the patient's current medications.  ROS: History obtained from the patient  General ROS: negative for - chills, fatigue, fever, night sweats, weight gain or weight loss Psychological ROS: negative for - behavioral disorder, hallucinations, memory difficulties, mood swings or suicidal ideation Ophthalmic ROS: negative for - blurry vision,  double vision, eye pain or loss of vision ENT ROS: negative for - epistaxis, nasal discharge, oral lesions, sore throat, tinnitus or vertigo Allergy and Immunology ROS: negative for - hives or itchy/watery eyes Hematological and Lymphatic ROS: negative for - bleeding problems, bruising or swollen lymph nodes Endocrine ROS: negative for - galactorrhea, hair pattern changes, polydipsia/polyuria or temperature intolerance Respiratory ROS: negative for - cough, hemoptysis, shortness of breath or wheezing Cardiovascular ROS: negative for - chest pain, dyspnea on exertion, edema or irregular heartbeat Gastrointestinal ROS: negative for - abdominal pain, diarrhea, hematemesis, nausea/vomiting or stool incontinence Genito-Urinary ROS: negative for - dysuria, hematuria, incontinence or urinary frequency/urgency Musculoskeletal ROS: negative for - joint swelling or muscular weakness Neurological ROS: as noted in HPI Dermatological ROS: negative for rash and skin lesion changes  Physical Examination: Blood pressure 134/98, pulse 67, temperature 98.5 F (36.9 C), temperature source Oral, resp. rate 17, SpO2 96.00%.  Neurologic Examination: Mental Status: Alert, oriented, thought content appropriate.  Speech was fluent for the most part. Patient had fairly frequent paraphasic errors. Able to follow commands without difficulty. Cranial Nerves: II-Visual fields were normal. III/IV/VI-Pupils were equal and reacted. Extraocular movements were full and conjugate.    V/VII-mild left facial numbness; mild right lower facial weakness facial weakness. VIII-normal. X-slight dysarthria; symmetrical palatal movement. Motor: Slightly reduced hand grip on the right compared to the left; motor exam is otherwise unremarkable. Sensory: Reduced perception of tactile sensation over entire left side compared to the right. Deep Tendon Reflexes: 1+ and symmetric. Plantars: Mute bilaterally Cerebellar: Normal  finger-to-nose testing on the left; slightly impaired on the right. Carotid auscultation: Normal  Ct Head Wo Contrast  08/17/2012   *  RADIOLOGY REPORT*  Clinical Data: Extremity weakness  CT HEAD WITHOUT CONTRAST  Technique:  Contiguous axial images were obtained from the base of the skull through the vertex without contrast.  Comparison: 12/07/2010  Findings: No skull fracture is noted.  Paranasal sinuses and mastoid air cells are unremarkable.  There is encephalomalacia in the right cerebellum from prior infarct.  No intracranial hemorrhage, mass effect or midline shift.  No acute cortical infarction.  No mass lesion is noted on this unenhanced scan. Stable mild cerebral atrophy.  Ventricular size is stable from prior exam.  The stable mild periventricular chronic white matter disease.  IMPRESSION: No acute intracranial abnormality.  No definite acute cortical infarction.  There is encephalomalacia from prior infarct in the right cerebellum.   Original Report Authenticated By: Natasha Mead, M.D.    Assessment: 57 y.o. male presenting with probable recurrent left MCA territory cerebral infarction with likely small vessel involvement.  Stroke Risk Factors - atrial fibrillation, diabetes mellitus, family history, hyperlipidemia and hypertension  Plan: 1. HgbA1c, fasting lipid panel 2. MRI, MRA  of the brain without contrast 3. PT consult, OT consult, Speech consult 4. Echocardiogram 5. Carotid dopplers 6. Prophylactic therapy-Antiplatelet med: Plavix 75 mg per day 7. Risk factor modification 8. Telemetry monitoring     C.R. Roseanne Reno, MD [facility] [phone]  08/17/2012, 11:09 PM

## 2012-08-17 NOTE — ED Notes (Addendum)
Pt c/o right leg pain/weakness onset 3 days... Reports he was seen at [facility] on 08/15/12; given naproxen for back pain... Denies: inj/trauma, strenuous activity, tingly/numbness... Hx of stroke... Alert w/no signs of acute distress.

## 2012-08-17 NOTE — ED Notes (Signed)
Back from CT, alert, NAD, calm, interactive.  

## 2012-08-17 NOTE — ED Notes (Signed)
Pt with hx of CVA, difficult to understand at times. Sent from [facility] with right arm weakness, right leg pain and weakness. CVA 2012 with left sided deficits and slurred speech normal since CVA. No facial droop, no arm drift. Right arm weaker grip than left. Denies blurred vision, denies headache.  Reports numbness on left side since stroke. Right sided weakness and numbness began 3 days ago. He is alert and answers questions appropriately.

## 2012-08-18 ENCOUNTER — Encounter (HOSPITAL_COMMUNITY): Payer: Self-pay | Admitting: General Practice

## 2012-08-18 ENCOUNTER — Observation Stay (HOSPITAL_COMMUNITY): Payer: Medicaid Other

## 2012-08-18 DIAGNOSIS — G459 Transient cerebral ischemic attack, unspecified: Secondary | ICD-10-CM

## 2012-08-18 DIAGNOSIS — I619 Nontraumatic intracerebral hemorrhage, unspecified: Secondary | ICD-10-CM

## 2012-08-18 DIAGNOSIS — I2510 Atherosclerotic heart disease of native coronary artery without angina pectoris: Secondary | ICD-10-CM

## 2012-08-18 LAB — GLUCOSE, CAPILLARY: Glucose-Capillary: 113 mg/dL — ABNORMAL HIGH (ref 70–99)

## 2012-08-18 LAB — LIPID PANEL
Cholesterol: 114 mg/dL (ref 0–200)
HDL: 46 mg/dL (ref >39)
Total CHOL/HDL Ratio: 2.5 RATIO
Triglycerides: 98 mg/dL (ref <150)
VLDL: 20 mg/dL (ref 0–40)

## 2012-08-18 MED ORDER — ACETAMINOPHEN 325 MG PO TABS
650.0000 mg | ORAL_TABLET | Freq: Four times a day (QID) | ORAL | Status: DC | PRN
  Administered 2012-08-18 – 2012-08-19 (×3): 650 mg via ORAL
  Filled 2012-08-18 (×3): qty 2

## 2012-08-18 NOTE — Progress Notes (Signed)
Echocardiogram 2D Echocardiogram has been performed.  08/18/2012 11:38 AM Gertie Fey, RVT, RDCS, RDMS

## 2012-08-18 NOTE — Progress Notes (Signed)
Rehab Admissions Coordinator Note:  Patient was screened by Brock Ra for appropriateness for an Inpatient Acute Rehab Consult.  At this time, we are recommending Inpatient Rehab consult.  Brock Ra 08/18/2012, 5:57 PM  I can be reached at [phone].

## 2012-08-18 NOTE — Progress Notes (Signed)
VASCULAR LAB PRELIMINARY  PRELIMINARY  PRELIMINARY  PRELIMINARY  Carotid duplex completed.    Preliminary report:  Bilateral:  1% to 39% ICA stenosis.  Vertebral artery flow is antegrade.     Dallas Torok, RVS 08/18/2012, 11:06 AM

## 2012-08-18 NOTE — Progress Notes (Signed)
[facility] PROGRESS NOTE  JEBADIAH IMPERATO ZOX:[id number] DOB: 10-26-1955 DOA: 08/17/2012 PCP: Jeanann Lewandowsky, MD  Assessment/Plan: Right hemiparesis -Concerned about new stroke -MRI brain -CT brain negative -Hemoglobin A1c 6.0 -echocardiogram and carotid Dopplers -PT evaluation -Continue aspirin for now -May need factor Xa antagonist if MRI shows new stroke Atrial fibrillation -Rate controlled -Continue carvedilol -Not on Coumadin because of history of subarachnoid hemorrhage in 2012 -Continue diltiazem Diabetes mellitus type 2 -continue NovoLog sliding scale -may be able to restart metformin if renal function improves prior to discharge Renal insufficiency/CKD stage II -Continue monitor Hypertension -Continue Altace, Aldactone, Cardura, Bi-dil  Right hip pain -X-ray right hip Family Communication:   Pt at beside Disposition Plan:   Home vs CIR when medically stable     Procedures/Studies: Ct Head Wo Contrast  08/17/2012   *RADIOLOGY REPORT*  Clinical Data: Extremity weakness  CT HEAD WITHOUT CONTRAST  Technique:  Contiguous axial images were obtained from the base of the skull through the vertex without contrast.  Comparison: 12/07/2010  Findings: No skull fracture is noted.  Paranasal sinuses and mastoid air cells are unremarkable.  There is encephalomalacia in the right cerebellum from prior infarct.  No intracranial hemorrhage, mass effect or midline shift.  No acute cortical infarction.  No mass lesion is noted on this unenhanced scan. Stable mild cerebral atrophy.  Ventricular size is stable from prior exam.  The stable mild periventricular chronic white matter disease.  IMPRESSION: No acute intracranial abnormality.  No definite acute cortical infarction.  There is encephalomalacia from prior infarct in the right cerebellum.   Original Report Authenticated By: Natasha Mead, M.D.         Subjective: Patient denies fevers, chills, chest discomfort, shortness of  breath, nausea, vomiting, diarrhea, abdominal pain, dizziness, slurred speech, visual disturbance. He states that his right upper extremity and right lower extremity weakness is about the same.  Objective: Filed Vitals:   08/18/12 0708 08/18/12 1306 08/18/12 1700 08/18/12 1812  BP: 120/88 125/83  117/66  Pulse: 69 88  97  Temp: 97.7 F (36.5 C) 98.1 F (36.7 C)  98.1 F (36.7 C)  TempSrc: Oral Oral  Oral  Resp: 18 16  16   Height:   5\' 11"  (1.803 m)   Weight:   69.58 kg (153 lb 6.3 oz)   SpO2: 99% 98%  99%    Intake/Output Summary (Last 24 hours) at 08/18/12 1934 Last data filed at 08/18/12 0853  Gross per 24 hour  Intake    360 ml  Output    500 ml  Net   -140 ml   Weight change:  Exam:   General:  Pt is alert, follows commands appropriately, not in acute distress  HEENT: No icterus, No thrush,  [place]/AT  Cardiovascular: IRRR, S1/S2, no rubs,  Respiratory: CTA bilaterally, no wheezing, no crackles, no rhonchi  Abdomen: Soft/+BS, non tender, non distended, no guarding  Extremities: No edema, No lymphangitis, No petechiae, No rashes, no synovitis  Data Reviewed: Basic Metabolic Panel:  Recent Labs Lab 08/17/12 1833 08/17/12 2220  NA 137  --   K 4.5  --   CL 103  --   CO2 25  --   GLUCOSE 110*  --   BUN 27*  --   CREATININE 1.40* 1.21  CALCIUM 10.1  --    Liver Function Tests: No results found for this basename: AST, ALT, ALKPHOS, BILITOT, PROT, ALBUMIN,  in the last 168 hours No results found for this basename:  LIPASE, AMYLASE,  in the last 168 hours No results found for this basename: AMMONIA,  in the last 168 hours CBC:  Recent Labs Lab 08/17/12 1833 08/17/12 2220  WBC 5.7 5.9  NEUTROABS 2.3  --   HGB 14.3 14.2  HCT 40.5 39.5  MCV 88.6 88.0  PLT 211 234   Cardiac Enzymes: No results found for this basename: CKTOTAL, CKMB, CKMBINDEX, TROPONINI,  in the last 168 hours BNP: No components found with this basename: POCBNP,  CBG:  Recent  Labs Lab 08/17/12 2219 08/18/12 0706 08/18/12 1147 08/18/12 1657  GLUCAP 91 74 110* 120*    No results found for this or any previous visit (from the past 240 hour(s)).   Scheduled Meds: . allopurinol  100 mg Oral Daily  . aspirin EC  81 mg Oral Q1200  . carvedilol  25 mg Oral BID WC  . diltiazem  360 mg Oral Q1200  . doxazosin  2 mg Oral QHS  . heparin  5,000 Units Subcutaneous Q8H  . insulin aspart  0-9 Units Subcutaneous TID WC  . isosorbide-hydrALAZINE  2 tablet Oral TID  . ramipril  10 mg Oral BID  . simvastatin  10 mg Oral q1800  . spironolactone  25 mg Oral Daily   Continuous Infusions:    Evans Levee, DO  [facility] Pager [phone]  If 7PM-7AM, please contact night-coverage [email or web address] Password [facility] 08/18/2012, 7:34 PM   LOS: 1 day

## 2012-08-18 NOTE — ED Provider Notes (Signed)
Medical screening examination/treatment/procedure(s) were conducted as a shared visit with resident-physician practitioner(s) and myself.  I personally evaluated the patient during the encounter.  Pt is a 57 y.o. male with pmhx as above presenting with worsening R sided weakness, new onset L sided numbness.  VSS, pt in NAD on exam.  Pt found to have no acute CT head changes.  Neuro did not rec TPA given symptoms began 2 days ago, medicine for admit for concern for new CVA.    Shanna Cisco, MD 08/18/12 [phone]

## 2012-08-18 NOTE — Progress Notes (Signed)
Stroke Team Progress Note  HISTORY [name] is an 57 y.o. male history of previous stroke with residual right hemiparesis, hypertension, hyperlipidemia, diabetes mellitus, coronary artery disease, atrial fibrillation and congestive heart failure, presenting with acute onset of worsening of weakness involving his right side. Onset was 2 days prior to presentation. He said increased difficulty with walking as well as with use of his right upper extremity. He said no changes in speech or swallowing. Facial droop has become more noticeable. CT scan of his head showed no acute intracranial abnormality. Patient had a hemorrhagic stroke in 2012, following which Coumadin was discontinued. His been on aspirin 81 mg per day. NIH stroke score was 4.  Patient was not a TPA candidate secondary to beyond treatment window. He was admitted to the neuro floor for further evaluation and treatment.  SUBJECTIVE Patient with residual but worsening right hemiparesis from hemorrhagic stroke 2012 on coumadin (subtherapeutic INR). Now with left sided numbness on baby aspirin. Symptoms improved.  OBJECTIVE Most recent Vital Signs: Filed Vitals:   08/17/12 2100 08/17/12 2115 08/18/12 0205 08/18/12 0708  BP: 135/96 134/98 119/85 120/88  Pulse: 80 67 53 69  Temp:   97.5 F (36.4 C) 97.7 F (36.5 C)  TempSrc:   Oral Oral  Resp: 17 17 18 18   SpO2: 94% 96% 99% 99%   CBG (last 3)   Recent Labs  08/17/12 2219 08/18/12 0706  GLUCAP 91 74    IV Fluid Intake:     MEDICATIONS  . allopurinol  100 mg Oral Daily  . aspirin EC  81 mg Oral Q1200  . carvedilol  25 mg Oral BID WC  . diltiazem  360 mg Oral Q1200  . doxazosin  2 mg Oral QHS  . heparin  5,000 Units Subcutaneous Q8H  . insulin aspart  0-9 Units Subcutaneous TID WC  . isosorbide-hydrALAZINE  2 tablet Oral TID  . ramipril  10 mg Oral BID  . simvastatin  10 mg Oral q1800  . spironolactone  25 mg Oral Daily   PRN:  acetaminophen  Diet:  Carb Control  thin liquids Activity:  Bathroom privileges with assist DVT Prophylaxis:  Heparin SQ  CLINICALLY SIGNIFICANT STUDIES Basic Metabolic Panel:  Recent Labs Lab 08/17/12 1833 08/17/12 2220  NA 137  --   K 4.5  --   CL 103  --   CO2 25  --   GLUCOSE 110*  --   BUN 27*  --   CREATININE 1.40* 1.21  CALCIUM 10.1  --    Liver Function Tests: No results found for this basename: AST, ALT, ALKPHOS, BILITOT, PROT, ALBUMIN,  in the last 168 hours CBC:  Recent Labs Lab 08/17/12 1833 08/17/12 2220  WBC 5.7 5.9  NEUTROABS 2.3  --   HGB 14.3 14.2  HCT 40.5 39.5  MCV 88.6 88.0  PLT 211 234   Coagulation:  Recent Labs Lab 08/17/12 1833  LABPROT 13.0  INR 1.00   Cardiac Enzymes: No results found for this basename: CKTOTAL, CKMB, CKMBINDEX, TROPONINI,  in the last 168 hours Urinalysis: No results found for this basename: COLORURINE, APPERANCEUR, LABSPEC, PHURINE, GLUCOSEU, HGBUR, BILIRUBINUR, KETONESUR, PROTEINUR, UROBILINOGEN, NITRITE, LEUKOCYTESUR,  in the last 168 hours Lipid Panel    Component Value Date/Time   CHOL 114 08/18/2012 0545   TRIG 98 08/18/2012 0545   HDL 46 08/18/2012 0545   CHOLHDL 2.5 08/18/2012 0545   VLDL 20 08/18/2012 0545   LDLCALC 48 08/18/2012 0545  HgbA1C  Lab Results  Component Value Date   HGBA1C 6.0 08/15/2012    Urine Drug Screen:   No results found for this basename: labopia, cocainscrnur, labbenz, amphetmu, thcu, labbarb    Alcohol Level: No results found for this basename: ETH,  in the last 168 hours  Ct Head Wo Contrast 08/17/2012 No acute intracranial abnormality.  No definite acute cortical infarction.  There is encephalomalacia from prior infarct in the right cerebellum.   MRI of the brain    MRA of the brain    2D Echocardiogram    Carotid Doppler  Preliminary report: Bilateral: 1% to 39% ICA stenosis. Vertebral artery flow is antegrade.   CXR    EKG  .   Therapy Recommendations   Physical Exam   Obese middle aged male not in  distress.Awake alert. Afebrile. Head is nontraumatic. Neck is supple without bruit. Hearing is normal. Cardiac exam no murmur or gallop. Lungs are clear to auscultation. Distal pulses are well felt. Neurological Exam :  Awake alert oriented to time place and person. No dysarthria or aphasia. Follows commands well. Extraocular movements are full range without nystagmus. Face is symmetric without weakness. Tongue is midline. Motor system exam reveals minimum right upper extremity drift. Mild weakness of the right grip and intrinsic hand muscles. Mild right hip flexor weakness. Mild right finger to nose and knee to heel dysmetria. Deep tendon reflexes are symmetric. Touch and pinprick sensation are preserved. Gait was not tested.   ASSESSMENT [name] is a 57 y.o. male presenting with worsening right hemiparesis and left sided numbness. CT Imaging reveals no acute infarct; however, MR pending. Infarct felt to be embolic secondary to atrial fibrillation.  On aspirin 81 mg orally every day prior to admission. Now on aspirin 81 mg orally every day for secondary stroke prevention. Patient with resultant right hemiparesis, left-hemisensory loss. Work up underway.   Right hemiparesis  ICH 2012 with coumadin use (INR subtherapeutic)--coumadin stopped  Atrial fibrillation  Hypertension  Diabetes mellitus, HGB A1C 6.0  Hyperlipidemia, LDL 48  Hospital day # 1  TREATMENT/PLAN  Continue aspirin 81 mg orally every day for secondary stroke prevention. Await MR results. If positive for stroke, then would discontinue the baby aspirin and start eliquis.  Risk factor modification  PT evaluation  Gwendolyn Lima. Manson Passey, [facility], MBA, MHA [facility] Pager: [phone] 08/18/2012 10:43 AM  I have personally obtained a history, examined the patient, evaluated imaging results, and formulated the assessment and plan of care. I agree with the above. Delia Heady, MD

## 2012-08-18 NOTE — Evaluation (Signed)
Physical Therapy Evaluation Patient Details Name: [name] MRN: [id number] DOB: 1955-11-18 Today's Date: 08/18/2012 Time: 4098-1191 PT Time Calculation (min): 26 min  PT Assessment / Plan / Recommendation History of Present Illness  [name] is an 57 y.o. male with hx of afib, previously on coumadin but discontinued as he has had ICH (2012), prior CVA with residual right sided weakness, HTN, CHF, DM, Hyperlipedemia, presents to the ER after having increased right sided weakness, causing him difficulty with walking.  He denied slurred speech, but stated that he noted a right facial droop as well.  Clinical Impression  Pt with limited overall mobility and slight weakness in right LE.  Pt with bilateral LE impaired coordination.  Limited overall ambulation distance and needs max cues for overall mobility.  Pt will benefit from acute PT services to improve overall mobility to prepare for safe d/c to next venue.  Highly recommend further therapy to increase overall independence to return home alone in evenings.     PT Assessment  Patient needs continued PT services    Follow Up Recommendations  CIR;Supervision/Assistance - 24 hour    Equipment Recommendations  None recommended by PT    Recommendations for Other Services OT consult   Frequency Min 4X/week    Precautions / Restrictions Precautions Precautions: Fall   Pertinent Vitals/Pain C/o right lower quadrant pain; pt described as "joint pain" pt does not rate       Mobility  Bed Mobility Bed Mobility: Supine to Sit Supine to Sit: 5: Supervision Details for Bed Mobility Assistance: Supervision for safety Transfers Transfers: Sit to Stand;Stand to Sit Sit to Stand: 4: Min guard Stand to Sit: 4: Min guard Details for Transfer Assistance: Minguard for safety with max cues for hand and RW placement.  Pt attempts to sit down without RW and body in apprioriate position. Ambulation/Gait Ambulation/Gait Assistance: 4: Min  assist Ambulation Distance (Feet): 10 Feet (5' x 2) Assistive device: Rolling walker Ambulation/Gait Assistance Details: Pt with increase anxiety with ambulation and having difficulty with LE placement.  Max cues for RW placement and body position within RW.  Gait Pattern: Step-to pattern;Decreased stride length;Shuffle Gait velocity: decreased Stairs: No Modified Rankin (Stroke Patients Only) Pre-Morbid Rankin Score: Moderate disability Modified Rankin: Moderately severe disability    Exercises     PT Diagnosis: Difficulty walking;Generalized weakness  PT Problem List: Decreased strength;Decreased activity tolerance;Decreased balance;Decreased mobility;Decreased coordination;Decreased knowledge of use of DME;Pain PT Treatment Interventions: DME instruction;Gait training;Stair training;Functional mobility training;Therapeutic activities;Therapeutic exercise;Balance training;Neuromuscular re-education;Patient/family education     PT Goals(Current goals can be found in the care plan section) Acute Rehab PT Goals Patient Stated Goal: To get stronger PT Goal Formulation: With patient Time For Goal Achievement: 09/01/12 Potential to Achieve Goals: Good  Visit Information  Last PT Received On: 08/18/12 Assistance Needed: +1 History of Present Illness: [name] is an 57 y.o. male with hx of afib, previously on coumadin but discontinued as he has had ICH (2012), prior CVA with residual right sided weakness, HTN, CHF, DM, Hyperlipedemia, presents to the ER after having increased right sided weakness, causing him difficulty with walking.  He denied slurred speech, but stated that he noted a right facial droop as well.       Prior Functioning  Home Living Family/patient expects to be discharged to:: Private residence Living Arrangements: Alone Available Help at Discharge: Personal care attendant (during the day; pt alone at night) Type of Home: House Home Access: Stairs to  enter Entrance Stairs-Number of Steps: 3 Entrance Stairs-Rails: Left Home Layout: One level Home Equipment: Walker - 2 wheels;Shower seat;Wheelchair - manual Prior Function Level of Independence: Needs assistance Gait / Transfers Assistance Needed: Pt ambulates with RW ADL's / Homemaking Assistance Needed: Assistance with cooking/cleaning;  Communication Communication: Expressive difficulties Dominant Hand: Right    Cognition  Cognition Arousal/Alertness: Awake/alert Behavior During Therapy: Flat affect Overall Cognitive Status: Within Functional Limits for tasks assessed    Extremity/Trunk Assessment Lower Extremity Assessment Lower Extremity Assessment: RLE deficits/detail;LLE deficits/detail RLE Deficits / Details: Overall 4+/5 RLE Coordination: decreased gross motor LLE Deficits / Details: WFL LLE Sensation: decreased light touch LLE Coordination: decreased gross motor   Balance    End of Session PT - End of Session Equipment Utilized During Treatment: Gait belt Activity Tolerance: Patient tolerated treatment well Patient left: in chair;with call bell/phone within reach Nurse Communication: Mobility status  GP     Mckynna Vanloan 08/18/2012, 5:08 PM  Jake Shark, PT DPT [phone]

## 2012-08-18 NOTE — Progress Notes (Signed)
Utilization review completed. Fradel Baldonado, RN, BSN. 

## 2012-08-18 NOTE — H&P (Signed)
After History was completed patient decided to enroll in [facility] Chart after previously declining

## 2012-08-19 DIAGNOSIS — G8110 Spastic hemiplegia affecting unspecified side: Secondary | ICD-10-CM

## 2012-08-19 DIAGNOSIS — E785 Hyperlipidemia, unspecified: Secondary | ICD-10-CM

## 2012-08-19 LAB — BASIC METABOLIC PANEL
BUN: 26 mg/dL — ABNORMAL HIGH (ref 6–23)
CO2: 24 mEq/L (ref 19–32)
Calcium: 9.5 mg/dL (ref 8.4–10.5)
Chloride: 106 mEq/L (ref 96–112)
Creatinine, Ser: 1.46 mg/dL — ABNORMAL HIGH (ref 0.50–1.35)
Glucose, Bld: 102 mg/dL — ABNORMAL HIGH (ref 70–99)

## 2012-08-19 LAB — GLUCOSE, CAPILLARY
Glucose-Capillary: 124 mg/dL — ABNORMAL HIGH (ref 70–99)
Glucose-Capillary: 139 mg/dL — ABNORMAL HIGH (ref 70–99)

## 2012-08-19 MED ORDER — POLYVINYL ALCOHOL 1.4 % OP SOLN
1.0000 [drp] | OPHTHALMIC | Status: DC | PRN
  Administered 2012-08-19 – 2012-08-20 (×2): 1 [drp] via OPHTHALMIC
  Filled 2012-08-19: qty 15

## 2012-08-19 MED ORDER — SODIUM CHLORIDE 0.9 % IV SOLN
INTRAVENOUS | Status: DC
Start: 2012-08-19 — End: 2012-08-20
  Administered 2012-08-19: 16:00:00 via INTRAVENOUS
  Administered 2012-08-20: 1000 mL via INTRAVENOUS

## 2012-08-19 NOTE — Care Management Note (Unsigned)
    Page 1 of 1   08/19/2012     4:00:11 PM   CARE MANAGEMENT NOTE 08/19/2012  Patient:  [name], [name]   Account Number:  [id number]  Date Initiated:  08/19/2012  Documentation initiated by:  Elmer Bales  Subjective/Objective Assessment:   Pt admitted for difficulty ambulating, right sided weakness.     Action/Plan:   Will follow for discharge needs.   Anticipated DC Date:  08/20/2012   Anticipated DC Plan:  IP REHAB FACILITY      DC Planning Services  CM consult      Choice offered to / List presented to:             Status of service:  In process, will continue to follow Medicare Important Message given?   (If response is "NO", the following Medicare IM given date fields will be blank) Date Medicare IM given:   Date Additional Medicare IM given:    Discharge Disposition:    Per UR Regulation:  Reviewed for med. necessity/level of care/duration of stay  If discussed at Long Length of Stay Meetings, dates discussed:    Comments:  08/19/12 1430 Elmer Bales RN, MSN, CM- Per Dr Don Perking request, CM contacted Melanee Spry from CIR to discuss status of CIR consult.  Per [place] Lathe physician will see patient this evening to determine appropriateness. Information relayed to Dr Tat.

## 2012-08-19 NOTE — Progress Notes (Signed)
Stroke Team Progress Note  HISTORY [name] is an 57 y.o. male history of previous stroke with residual right hemiparesis, hypertension, hyperlipidemia, diabetes mellitus, coronary artery disease, atrial fibrillation and congestive heart failure, presenting with acute onset of worsening of weakness involving his right side. Onset was 2 days prior to presentation. He said increased difficulty with walking as well as with use of his right upper extremity. He said no changes in speech or swallowing. Facial droop has become more noticeable. CT scan of his head showed no acute intracranial abnormality. Patient had a hemorrhagic stroke in 2012, following which Coumadin was discontinued. His been on aspirin 81 mg per day. NIH stroke score was 4.  Patient was not a TPA candidate secondary to beyond treatment window. He was admitted to the neuro floor for further evaluation and treatment.  SUBJECTIVE No new symptoms.  OBJECTIVE Most recent Vital Signs: Filed Vitals:   08/18/12 2154 08/19/12 0200 08/19/12 0516 08/19/12 1008  BP: 108/85 120/85 122/85   Pulse: 91 76 81 89  Temp: 97.9 F (36.6 C) 97.2 F (36.2 C) 97.6 F (36.4 C) 97.6 F (36.4 C)  TempSrc: Oral Oral Oral Oral  Resp: 18 18 18 16   Height:      Weight:      SpO2: 100% 100% 100% 100%   CBG (last 3)   Recent Labs  08/18/12 1657 08/18/12 2235 08/19/12 0712  GLUCAP 120* 113* 96    IV Fluid Intake:     MEDICATIONS  . allopurinol  100 mg Oral Daily  . aspirin EC  81 mg Oral Q1200  . carvedilol  25 mg Oral BID WC  . diltiazem  360 mg Oral Q1200  . doxazosin  2 mg Oral QHS  . heparin  5,000 Units Subcutaneous Q8H  . insulin aspart  0-9 Units Subcutaneous TID WC  . isosorbide-hydrALAZINE  2 tablet Oral TID  . ramipril  10 mg Oral BID  . simvastatin  10 mg Oral q1800  . spironolactone  25 mg Oral Daily   PRN:  acetaminophen  Diet:  Carb Control thin liquids Activity:  Bathroom privileges with assist DVT Prophylaxis:   Heparin SQ  CLINICALLY SIGNIFICANT STUDIES Basic Metabolic Panel:   Recent Labs Lab 08/17/12 1833 08/17/12 2220 08/19/12 0545  NA 137  --  139  K 4.5  --  3.9  CL 103  --  106  CO2 25  --  24  GLUCOSE 110*  --  102*  BUN 27*  --  26*  CREATININE 1.40* 1.21 1.46*  CALCIUM 10.1  --  9.5   Liver Function Tests: No results found for this basename: AST, ALT, ALKPHOS, BILITOT, PROT, ALBUMIN,  in the last 168 hours CBC:   Recent Labs Lab 08/17/12 1833 08/17/12 2220  WBC 5.7 5.9  NEUTROABS 2.3  --   HGB 14.3 14.2  HCT 40.5 39.5  MCV 88.6 88.0  PLT 211 234   Coagulation:   Recent Labs Lab 08/17/12 1833  LABPROT 13.0  INR 1.00   Cardiac Enzymes: No results found for this basename: CKTOTAL, CKMB, CKMBINDEX, TROPONINI,  in the last 168 hours Urinalysis: No results found for this basename: COLORURINE, APPERANCEUR, LABSPEC, PHURINE, GLUCOSEU, HGBUR, BILIRUBINUR, KETONESUR, PROTEINUR, UROBILINOGEN, NITRITE, LEUKOCYTESUR,  in the last 168 hours Lipid Panel    Component Value Date/Time   CHOL 114 08/18/2012 0545   TRIG 98 08/18/2012 0545   HDL 46 08/18/2012 0545   CHOLHDL 2.5 08/18/2012 0545  VLDL 20 08/18/2012 0545   LDLCALC 48 08/18/2012 0545   HgbA1C  Lab Results  Component Value Date   HGBA1C 6.1* 08/17/2012    Urine Drug Screen:   No results found for this basename: labopia,  cocainscrnur,  labbenz,  amphetmu,  thcu,  labbarb    Alcohol Level: No results found for this basename: ETH,  in the last 168 hours  Ct Head Wo Contrast 08/17/2012 No acute intracranial abnormality.  No definite acute cortical infarction.  There is encephalomalacia from prior infarct in the right cerebellum.   MRI of the brain  . No acute intracranial abnormality. 2. Advanced chronic ischemic and small vessel disease, with both expected evolution since 2012 and also some progression (in the form of increased number of chronic micro hemorrhages in the brain). 3. Sequelae of right frontal  ventriculostomy  MRA of the brain    2D Echocardiogram    Carotid Doppler  Preliminary report: Bilateral: 1% to 39% ICA stenosis. Vertebral artery flow is antegrade.   CXR    EKG  .   Therapy Recommendations   Physical Exam   Obese middle aged male not in distress.Awake alert. Afebrile. Head is nontraumatic. Neck is supple without bruit. Hearing is normal. Cardiac exam no murmur or gallop. Lungs are clear to auscultation. Distal pulses are well felt. Neurological Exam :  Awake alert oriented to time place and person. No dysarthria or aphasia. Follows commands well. Extraocular movements are full range without nystagmus. Face is symmetric without weakness. Tongue is midline. Motor system exam reveals minimum right upper extremity drift. Mild weakness of the right grip and intrinsic hand muscles. Mild right hip flexor weakness. Mild right finger to nose and knee to heel dysmetria. Deep tendon reflexes are symmetric. Touch and pinprick sensation are preserved. Gait was not tested.   ASSESSMENT [name] is a 57 y.o. male presenting with worsening right hemiparesis and left sided numbness. CT and MRI Imaging reveals no acute infarct. On aspirin 81 mg orally every day prior to admission. Now on aspirin 81 mg orally every day for secondary stroke prevention. Patient with resultant right hemiparesis, left-hemisensory loss.   Right hemiparesis  ICH 2012 with coumadin use (INR subtherapeutic)--coumadin stopped  Atrial fibrillation  Hypertension  Diabetes mellitus, HGB A1C 6.0  Hyperlipidemia, LDL 48  Hospital day # 2  TREATMENT/PLAN  Continue aspirin 81 mg orally every day for secondary stroke prevention.  CIR recommended  Risk factor modification  Stroke service will sign off.  Follow up with Dr. Pearlean Brownie in 2 months.  Gwendolyn Lima. Manson Passey, [facility], MBA, MHA [facility] Pager: [phone] 08/19/2012 10:09 AM  I have personally obtained a history, examined the  patient, evaluated imaging results, and formulated the assessment and plan of care. I agree with the above.  Delia Heady, MD

## 2012-08-19 NOTE — Progress Notes (Signed)
[facility] PROGRESS NOTE  [name] ZOX:[id number] DOB: 31-Dec-1955 DOA: 08/17/2012 PCP: Jeanann Lewandowsky, MD  Assessment/Plan: Right hemiparesis  -Concerned about new stroke  -MRI brain--negative for any acute findings, no new stroke -CT brain negative  -Hemoglobin A1c 6.0  -PT evaluation--recommended CIR -Continue aspirin for now as per neurology recommendation Atrial fibrillation  -Rate controlled  -Continue carvedilol  -Not on Coumadin because of history of subarachnoid hemorrhage in 2012  -Continue diltiazem  Diabetes mellitus type 2  -continue NovoLog sliding scale  -may be able to restart metformin if renal function improves prior to discharge  Renal insufficiency/CKD stage II  -Continue monitor  -we'll give 500 cc normal saline as the patient's serum creatinine has gradually increased Hypertension  -controlled -Continue Altace, Aldactone, Cardura, Bi-dil  Right hip pain  -X-ray right hip--negative for any fracture or dislocation -try Lidoderm Family Communication: Pt at beside  Disposition Plan: CIR--pt is medically stable           Procedures/Studies: Dg Hip Complete Right  08/18/2012   *RADIOLOGY REPORT*  Clinical Data: Right hip pain.  RIGHT HIP - COMPLETE 2+ VIEW  Comparison: None.  Findings: Imaged bones, joints and soft tissues appear normal.  IMPRESSION: Negative exam.   Original Report Authenticated By: Holley Dexter, M.D.   Ct Head Wo Contrast  08/17/2012   *RADIOLOGY REPORT*  Clinical Data: Extremity weakness  CT HEAD WITHOUT CONTRAST  Technique:  Contiguous axial images were obtained from the base of the skull through the vertex without contrast.  Comparison: 12/07/2010  Findings: No skull fracture is noted.  Paranasal sinuses and mastoid air cells are unremarkable.  There is encephalomalacia in the right cerebellum from prior infarct.  No intracranial hemorrhage, mass effect or midline shift.  No acute cortical infarction.  No mass lesion  is noted on this unenhanced scan. Stable mild cerebral atrophy.  Ventricular size is stable from prior exam.  The stable mild periventricular chronic white matter disease.  IMPRESSION: No acute intracranial abnormality.  No definite acute cortical infarction.  There is encephalomalacia from prior infarct in the right cerebellum.   Original Report Authenticated By: Natasha Mead, M.D.   Mri Brain Without Contrast  08/18/2012   *RADIOLOGY REPORT*  Clinical Data: 57 year old male with increased right side weakness. Prior stroke.  MRI HEAD WITHOUT CONTRAST  Technique:  Multiplanar, multiecho pulse sequences of the brain and surrounding structures were obtained according to standard protocol without intravenous contrast.  Comparison: Head CTs 08/17/2012.  Brain MRI 11/20/2010.  Findings: New but chronic right superior frontal gyrus blood products, situated anteriorly and tracking to the right lateral ventricle.  This probably reflects previous ventriculostomy.  Superimposed hemosiderin deposition at the right cerebellar infarct which was acute on the 2012 comparison.  Underlying advanced chronic micro hemorrhages in the brain, especially the deep gray matter nuclei and brain stem.  Brain stem involvement does appear progressed since 2012.  No restricted diffusion to suggest acute infarction.  No ventriculomegaly. No midline shift, mass effect, or evidence of mass lesion.  No acute intracranial hemorrhage identified. Negative pituitary, cervicomedullary junction visualized cervical spine.  Cystic encephalomalacia in the right cerebellum and dorsal brain stem corresponding to the 2012 infarct.  Superimposed chronic left cerebellar hemisphere lacunar infarct is stable.  Comparatively mild chronic cerebral white matter T2 and FLAIR hyperintensity is stable.  Small chronic cortically based frontal lobe and right parietal lobe infarcts are stable.  Major intracranial vascular flow voids are stable, with chronically abnormal  appearance of the  distal left vertebral artery and a degree of intracranial artery dolichoectasia.  Visualized orbit soft tissues are within normal limits.  Visualized paranasal sinuses and mastoids are clear.  Normal bone marrow signal.  Negative scalp soft tissues.  IMPRESSION: 1. No acute intracranial abnormality. 2.  Advanced chronic ischemic and small vessel disease, with both expected evolution since 2012 and also some progression (in the form of increased number of chronic micro hemorrhages in the brain). 3.  Sequelae of right frontal ventriculostomy.   Original Report Authenticated By: Erskine Speed, M.D.         Subjective: Patient continues to complain of some right hip pain. Denies any fevers, chills, chest pain, short of breath, nausea, vomiting, diarrhea, dizziness, headache. No visual disturbance. Right upper and right lower regimen weakness unchanged.  Objective: Filed Vitals:   08/18/12 2154 08/19/12 0200 08/19/12 0516 08/19/12 1008  BP: 108/85 120/85 122/85 123/99  Pulse: 91 76 81 89  Temp: 97.9 F (36.6 C) 97.2 F (36.2 C) 97.6 F (36.4 C) 97.6 F (36.4 C)  TempSrc: Oral Oral Oral Oral  Resp: 18 18 18 16   Height:      Weight:      SpO2: 100% 100% 100% 100%   No intake or output data in the 24 hours ending 08/19/12 1446 Weight change:  Exam:   General:  Pt is alert, follows commands appropriately, not in acute distress  HEENT: No icterus, No thrush,  [place]/AT  Cardiovascular: RRR, S1/S2, no rubs, no gallops  Respiratory: CTA bilaterally, no wheezing, no crackles, no rhonchi  Abdomen: Soft/+BS, non tender, non distended, no guarding  Extremities: No edema, No lymphangitis, No petechiae, No rashes, no synovitis  Data Reviewed: Basic Metabolic Panel:  Recent Labs Lab 08/17/12 1833 08/17/12 2220 08/19/12 0545  NA 137  --  139  K 4.5  --  3.9  CL 103  --  106  CO2 25  --  24  GLUCOSE 110*  --  102*  BUN 27*  --  26*  CREATININE 1.40* 1.21 1.46*   CALCIUM 10.1  --  9.5   Liver Function Tests: No results found for this basename: AST, ALT, ALKPHOS, BILITOT, PROT, ALBUMIN,  in the last 168 hours No results found for this basename: LIPASE, AMYLASE,  in the last 168 hours No results found for this basename: AMMONIA,  in the last 168 hours CBC:  Recent Labs Lab 08/17/12 1833 08/17/12 2220  WBC 5.7 5.9  NEUTROABS 2.3  --   HGB 14.3 14.2  HCT 40.5 39.5  MCV 88.6 88.0  PLT 211 234   Cardiac Enzymes: No results found for this basename: CKTOTAL, CKMB, CKMBINDEX, TROPONINI,  in the last 168 hours BNP: No components found with this basename: POCBNP,  CBG:  Recent Labs Lab 08/18/12 1147 08/18/12 1657 08/18/12 2235 08/19/12 0712 08/19/12 1143  GLUCAP 110* 120* 113* 96 113*    No results found for this or any previous visit (from the past 240 hour(s)).   Scheduled Meds: . allopurinol  100 mg Oral Daily  . aspirin EC  81 mg Oral Q1200  . carvedilol  25 mg Oral BID WC  . diltiazem  360 mg Oral Q1200  . doxazosin  2 mg Oral QHS  . heparin  5,000 Units Subcutaneous Q8H  . insulin aspart  0-9 Units Subcutaneous TID WC  . isosorbide-hydrALAZINE  2 tablet Oral TID  . ramipril  10 mg Oral BID  . simvastatin  10 mg Oral  q93  . spironolactone  25 mg Oral Daily   Continuous Infusions:    Estefany Goebel, DO  [facility] Pager [phone]  If 7PM-7AM, please contact night-coverage [email or web address] Password [email or web address] 08/19/2012, 2:46 PM   LOS: 2 days

## 2012-08-19 NOTE — Consult Note (Signed)
Physical Medicine and Rehabilitation Consult Reason for Consult: Suspect CVA Referring Physician: [facility]   HPI: [name] is a 57 y.o. right-handed male with history of atrial fibrillation, CVA  December 2012  with hemorrhagic transformation patient had been on Coumadin therapy and this was reversed with long hospital course necessitating the need for ventriculostomy as well as tracheostomy with PEG tube and received inpatient rehabilitation services with right-sided residual weakness. Patient was discharged to home after being decannulated and gastrostomy tube removed and diet advanced and he was moderate assist ambulate 50 feet. Patient lives alone was using a walker prior to admission and had a caregiver from 9 AM to 12 noon daily to help with activities of daily living in meals. Admitted 08/17/2012 with increasing right-sided weakness and right facial droop. MRI of the brain showed no acute intracranial abnormality was noted advanced ischemic small vessel changes. Carotid Dopplers with no ICA stenosis. Echocardiogram was pending. Patient did not receive TPA. Patient currently on a regular consistency diet. Neurology services consulted presently maintained on aspirin therapy for CVA prophylaxis. Subcutaneous heparin for DVT prophylaxis. Physical therapy evaluation completed a 7 2014 with recommendations of physical medicine rehabilitation consult to consider inpatient rehabilitation services.  Patient states that he had right hip pain as well as some leg weakness related to this. He came in without complaints of right arm weakness. Hip x-ray negative He was seen by his primary physician for back pain about a week ago  Review of Systems  Respiratory: Positive for shortness of breath.   Cardiovascular: Positive for palpitations and leg swelling.  Musculoskeletal: Positive for myalgias and joint pain.  Neurological: Positive for weakness.  All other systems reviewed and are negative.   Past  Medical History  Diagnosis Date  . Diabetes mellitus   . Coronary artery disease   . Hypertension   . Gout   . CHF (congestive heart failure)   . Afib   . Arthritis   . Hypertensive emergency 11/19/2010  . Pulmonary edema 11/19/2010  . Acute exacerbation of congestive heart failure 11/19/2010  . Thyroiditis 11/20/2010  . ICH (intracerebral hemorrhage) 12/22/2010  . Stroke 11/19/2010  . Respiratory failure 11/19/2010  . CAD (coronary artery disease) 11/20/2010  . Physical deconditioning 12/22/2010  . Diabetes mellitus 11/19/2010  . A-fib 11/19/2010  . Hyperlipemia 11/21/2010  . Shortness of breath    Past Surgical History  Procedure Laterality Date  . Tracheostomy tube placement  11/28/2010    Procedure: TRACHEOSTOMY;  Surgeon: Susy Frizzle, MD;  Location: [facility] OR;  Service: ENT;  Laterality: N/A;  . Peg placement  12/03/2010    Procedure: PERCUTANEOUS ENDOSCOPIC GASTROSTOMY (PEG) PLACEMENT;  Surgeon: Hart Carwin, MD;  Location: [facility] ENDOSCOPY;  Service: Endoscopy;  Laterality: N/A;   History reviewed. No pertinent family history. Social History:  reports that he quit smoking about 6 years ago. His smoking use included Cigarettes. He smoked 0.00 packs per day. He has never used smokeless tobacco. He reports that he does not drink alcohol or use illicit drugs. Allergies: No Known Allergies Medications Prior to Admission  Medication Sig Dispense Refill  . allopurinol (ZYLOPRIM) 100 MG tablet Take 1 tablet (100 mg total) by mouth daily.  30 tablet  3  . aspirin 81 MG tablet Take 81 mg by mouth daily at 12 noon.      . baclofen (LIORESAL) 10 MG tablet Take 0.5 tablets (5 mg total) by mouth 3 (three) times daily.  45 tablet  1  .  carvedilol (COREG) 25 MG tablet Take 1 tablet (25 mg total) by mouth 2 (two) times daily with a meal.  60 tablet  3  . diltiazem (CARDIZEM LA) 180 MG 24 hr tablet Take 2 tablets (360 mg total) by mouth daily at 12 noon.  60 tablet  3  . doxazosin (CARDURA) 2 MG tablet  Take 1 tablet (2 mg total) by mouth at bedtime.  30 tablet  3  . isosorbide-hydrALAZINE (BIDIL) 20-37.5 MG per tablet Take 2 tablets by mouth 3 (three) times daily.  90 tablet  3  . metFORMIN (GLUCOPHAGE) 850 MG tablet Take 1 tablet (850 mg total) by mouth 2 (two) times daily with a meal.  60 tablet  3  . naproxen (NAPROSYN) 500 MG tablet Take 500 mg by mouth 2 (two) times daily as needed. For pain/inflammation      . pantoprazole (PROTONIX) 40 MG tablet Take 1 tablet (40 mg total) by mouth daily at 12 noon.  30 tablet  3  . pravastatin (PRAVACHOL) 20 MG tablet Take 20 mg by mouth every morning.      . ramipril (ALTACE) 10 MG capsule Take 1 capsule (10 mg total) by mouth 2 (two) times daily.  30 capsule  3  . spironolactone (ALDACTONE) 25 MG tablet Take 1 tablet (25 mg total) by mouth daily.  30 tablet  3  . glucose monitoring kit (FREESTYLE) monitoring kit 1 each by Does not apply route 4 (four) times daily - after meals and at bedtime. 1 month Diabetic Testing Supplies for QAC-QHS accuchecks.  1 each  1    Home: Home Living Family/patient expects to be discharged to:: Private residence Living Arrangements: Non-relatives/Friends Available Help at Discharge: Personal care attendant (during the day; pt alone at night) Type of Home: House Home Access: Stairs to enter [employer] of Steps: 3 Entrance Stairs-Rails: Left Home Layout: One level Home Equipment: Walker - 2 wheels;Shower seat;Wheelchair - manual  Functional History:   Functional Status:  Mobility: Bed Mobility Bed Mobility: Supine to Sit Supine to Sit: 5: Supervision Transfers Transfers: Sit to Stand;Stand to Sit Sit to Stand: 4: Min guard Stand to Sit: 4: Min guard Ambulation/Gait Ambulation/Gait Assistance: 4: Min assist Ambulation Distance (Feet): 10 Feet (5' x 2) Assistive device: Rolling walker Ambulation/Gait Assistance Details: Pt with increase anxiety with ambulation and having difficulty with LE  placement.  Max cues for RW placement and body position within RW.  Gait Pattern: Step-to pattern;Decreased stride length;Shuffle Gait velocity: decreased Stairs: No    ADL:    Cognition: Cognition Overall Cognitive Status: Within Functional Limits for tasks assessed Orientation Level: Oriented X4 Cognition Arousal/Alertness: Awake/alert Behavior During Therapy: Flat affect Overall Cognitive Status: Within Functional Limits for tasks assessed  Blood pressure 122/85, pulse 81, temperature 97.6 F (36.4 C), temperature source Oral, resp. rate 18, height 5\' 11"  (1.803 m), weight 69.58 kg (153 lb 6.3 oz), SpO2 100.00%. Physical Exam  Vitals reviewed. Constitutional: He is oriented to person, place, and time.  HENT:  Head: Normocephalic.  Eyes: EOM are normal.  Neck: Neck supple. No thyromegaly present.  Cardiovascular:  Cardiac rate irregular irregular  Pulmonary/Chest: Effort normal and breath sounds normal. No respiratory distress.  Abdominal: Soft. Bowel sounds are normal. He exhibits no distension.  Neurological: He is alert and oriented to person, place, and time.  Speech is mildly dysarthric but fully intelligible. Patient follows full commands  Skin: Skin is warm and dry.  Psychiatric: He has a normal mood and  affect.   cerebellar moderate dysmetria right finger nose to finger, no evidence of dysmetria on the left side. Mild dysmetria right heel to shin, normal on left Motor strength 4/5 in the right deltoid, bicep, tricep, grip, hip flexors, knee extensors, ankle dorsiflexor plantar flexor, 5/5 on the left side Sensory exam was normal on the right and reduced in the left upper and left lower extremity  Results for orders placed during the hospital encounter of 08/17/12 (from the past 24 hour(s))  GLUCOSE, CAPILLARY     Status: None   Collection Time    08/18/12  7:06 AM      Result Value Range   Glucose-Capillary 74  70 - 99 mg/dL   Comment 1 Documented in Chart      Comment 2 Notify RN    GLUCOSE, CAPILLARY     Status: Abnormal   Collection Time    08/18/12 11:47 AM      Result Value Range   Glucose-Capillary 110 (*) 70 - 99 mg/dL  GLUCOSE, CAPILLARY     Status: Abnormal   Collection Time    08/18/12  4:57 PM      Result Value Range   Glucose-Capillary 120 (*) 70 - 99 mg/dL  GLUCOSE, CAPILLARY     Status: Abnormal   Collection Time    08/18/12 10:35 PM      Result Value Range   Glucose-Capillary 113 (*) 70 - 99 mg/dL   Dg Hip Complete Right  08/18/2012   *RADIOLOGY REPORT*  Clinical Data: Right hip pain.  RIGHT HIP - COMPLETE 2+ VIEW  Comparison: None.  Findings: Imaged bones, joints and soft tissues appear normal.  IMPRESSION: Negative exam.   Original Report Authenticated By: Holley Dexter, M.D.   Ct Head Wo Contrast  08/17/2012   *RADIOLOGY REPORT*  Clinical Data: Extremity weakness  CT HEAD WITHOUT CONTRAST  Technique:  Contiguous axial images were obtained from the base of the skull through the vertex without contrast.  Comparison: 12/07/2010  Findings: No skull fracture is noted.  Paranasal sinuses and mastoid air cells are unremarkable.  There is encephalomalacia in the right cerebellum from prior infarct.  No intracranial hemorrhage, mass effect or midline shift.  No acute cortical infarction.  No mass lesion is noted on this unenhanced scan. Stable mild cerebral atrophy.  Ventricular size is stable from prior exam.  The stable mild periventricular chronic white matter disease.  IMPRESSION: No acute intracranial abnormality.  No definite acute cortical infarction.  There is encephalomalacia from prior infarct in the right cerebellum.   Original Report Authenticated By: Natasha Mead, M.D.   Mri Brain Without Contrast  08/18/2012   *RADIOLOGY REPORT*  Clinical Data: 57 year old male with increased right side weakness. Prior stroke.  MRI HEAD WITHOUT CONTRAST  Technique:  Multiplanar, multiecho pulse sequences of the brain and surrounding structures  were obtained according to standard protocol without intravenous contrast.  Comparison: Head CTs 08/17/2012.  Brain MRI 11/20/2010.  Findings: New but chronic right superior frontal gyrus blood products, situated anteriorly and tracking to the right lateral ventricle.  This probably reflects previous ventriculostomy.  Superimposed hemosiderin deposition at the right cerebellar infarct which was acute on the 2012 comparison.  Underlying advanced chronic micro hemorrhages in the brain, especially the deep gray matter nuclei and brain stem.  Brain stem involvement does appear progressed since 2012.  No restricted diffusion to suggest acute infarction.  No ventriculomegaly. No midline shift, mass effect, or evidence of mass lesion.  No  acute intracranial hemorrhage identified. Negative pituitary, cervicomedullary junction visualized cervical spine.  Cystic encephalomalacia in the right cerebellum and dorsal brain stem corresponding to the 2012 infarct.  Superimposed chronic left cerebellar hemisphere lacunar infarct is stable.  Comparatively mild chronic cerebral white matter T2 and FLAIR hyperintensity is stable.  Small chronic cortically based frontal lobe and right parietal lobe infarcts are stable.  Major intracranial vascular flow voids are stable, with chronically abnormal appearance of the distal left vertebral artery and a degree of intracranial artery dolichoectasia.  Visualized orbit soft tissues are within normal limits.  Visualized paranasal sinuses and mastoids are clear.  Normal bone marrow signal.  Negative scalp soft tissues.  IMPRESSION: 1. No acute intracranial abnormality. 2.  Advanced chronic ischemic and small vessel disease, with both expected evolution since 2012 and also some progression (in the form of increased number of chronic micro hemorrhages in the brain). 3.  Sequelae of right frontal ventriculostomy.   Original Report Authenticated By: Erskine Speed, M.D.    Assessment/Plan: Diagnosis:  Chronic right cerebellar infarct with right sided ataxia 1. Does the need for close, 24 hr/day medical supervision in concert with the patient's rehab needs make it unreasonable for this patient to be served in a less intensive setting? Yes 2. Co-Morbidities requiring supervision/potential complications: DM,AFib 3. Due to bladder management, bowel management, safety, skin/wound care, disease management and medication administration, does the patient require 24 hr/day rehab nursing? No 4. Does the patient require coordinated care of a physician, rehab nurse, Not applicable to address physical and functional deficits in the context of the above medical diagnosis(es)? No Addressing deficits in the following areas: Not applicable 5. Can the patient actively participate in an intensive therapy program of at least 3 hrs of therapy per day at least 5 days per week? Potentially 6. The potential for patient to make measurable gains while on inpatient rehab is poor 7. Anticipated functional outcomes upon discharge from inpatient rehab are not applicable with PT, not applicable with OT, not applicable with SLP. 8. Estimated rehab length of stay to reach the above functional goals is: Not applicable 9. Does the patient have adequate social supports to accommodate these discharge functional goals? Yes 10. Anticipated D/C setting: Home 11. Anticipated post D/C treatments: Has caregiver during the day 12. Overall Rehab/Functional Prognosis: fair  RECOMMENDATIONS: This patient's condition is appropriate for continued rehabilitative care in the following setting: [facility] Patient has agreed to participate in recommended program. Potentially Note that insurance prior authorization may be required for reimbursement for recommended care.  Comment: Patient appears to be nearing his baseline. His initial complaint was a right lower extremity pain. He may benefit from lumbar MRI to further evaluate given his history of back  pain.    08/19/2012

## 2012-08-19 NOTE — Clinical Social Work Psychosocial (Signed)
Clinical Social Work Department BRIEF PSYCHOSOCIAL ASSESSMENT 08/19/2012  Patient:  [name], [name]     Account Number:  [id number]     Admit date:  08/17/2012  Clinical Social Worker:  Sherre Lain  Date/Time:  08/19/2012 04:19 PM  Referred by:  Physician  Date Referred:  08/19/2012 Referred for  SNF Placement   Other Referral:   none.   Interview type:  Patient Other interview type:   none.    PSYCHOSOCIAL DATA Living Status:  ALONE Admitted from facility:   Level of care:   Primary support name:  none. Primary support relationship to patient:   Degree of support available:   Pt currently does not have any family or friends to provide support.    CURRENT CONCERNS Current Concerns  Other - See comment   Other Concerns:   lack of support.    SOCIAL WORK ASSESSMENT / PLAN CSW met with pt at bedside. Pt stated that prior to admission to [facility] he was living at home alone. Pt stated that he has a home aide person who comes to his home every morning to clean and cook for him. CSW spoke to pt about SNF placement upon discharge. Pt agree and understood that SNF placement would be the safest option for him if he is denied from CIR. CSW to continue to follow and assist with any discharge needs.   Assessment/plan status:  Psychosocial Support/Ongoing Assessment of Needs Other assessment/ plan:   none.   Information/referral to community resources:   none.    PATIENTS/FAMILYS RESPONSE TO PLAN OF CARE: Pt was agreeable and understanding of CSW plan of care.       Darlyn Chamber, MSW, LCSWA Clinical Social Work [phone]

## 2012-08-19 NOTE — Progress Notes (Signed)
Physical Therapy Treatment Patient Details Name: [name] MRN: [id number] DOB: [birth year] Today's Date: 08/19/2012 Time: 4098-1191 PT Time Calculation (min): 24 min  PT Assessment / Plan / Recommendation  History of Present Illness [name] is an 57 y.o. male with hx of afib, previously on coumadin but discontinued as he has had ICH (2012), prior CVA with residual right sided weakness, HTN, CHF, DM, Hyperlipedemia, presents to the ER after having increased right sided weakness, causing him difficulty with walking.  He denied slurred speech, but stated that he noted a right facial droop as well.   PT Comments   Patient continues to have decreased strength and decreased coordination/ataxia of RLE, decreased sensation of LLE, impacting functional mobility and gait.  Making improvements with PT.  Agree with need for Inpatient Rehab stay to return to maximal independent level.  Follow Up Recommendations  CIR;Supervision/Assistance - 24 hour     Does the patient have the potential to tolerate intense rehabilitation     Barriers to Discharge        Equipment Recommendations  None recommended by PT    Recommendations for Other Services OT consult  Frequency Min 4X/week   Progress towards PT Goals Progress towards PT goals: Progressing toward goals  Plan Current plan remains appropriate    Precautions / Restrictions Precautions Precautions: Fall Restrictions Weight Bearing Restrictions: No   Pertinent Vitals/Pain     Mobility  Bed Mobility Bed Mobility: Not assessed Transfers Transfers: Sit to Stand;Stand to Sit Sit to Stand: 4: Min assist;With upper extremity assist;With armrests;From chair/3-in-1 Stand to Sit: 4: Min guard;With upper extremity assist;With armrests;To chair/3-in-1 Details for Transfer Assistance: Required verbal cues for hand placement.  Assist to rise from chair, and for balance/safety in standing. Ambulation/Gait Ambulation/Gait Assistance: 4: Min  assist Ambulation Distance (Feet): 48 Feet (24' x2 with sitting rest break) Assistive device: Rolling walker Ambulation/Gait Assistance Details: Patient with wide base of support, keeping knees extended, and taking short shuffling steps during gait.  Ataxic movement noted.  Verbal cues to try to take longer steps and stay close to RW.  Patient having difficulty controlling bil. LE's during swing phase of gait. Gait Pattern: Step-to pattern;Decreased step length - right;Decreased step length - left;Decreased stride length;Decreased hip/knee flexion - right;Decreased hip/knee flexion - left;Wide base of support;Trunk flexed;Shuffle;Ataxic;Decreased trunk rotation Gait velocity: decreased Modified Rankin (Stroke Patients Only) Pre-Morbid Rankin Score: Moderate disability Modified Rankin: Moderately severe disability    Exercises Other Exercises Other Exercises: Shallow squats with feet even - 10 reps Other Exercises: Shallow squats with one foot forward - 5 reps each.  Patient using UE's for 50% of work.   PT Diagnosis:    PT Problem List:   PT Treatment Interventions:     PT Goals (current goals can now be found in the care plan section)    Visit Information  Last PT Received On: 08/19/12 Assistance Needed: +1 History of Present Illness: [name] is an 57 y.o. male with hx of afib, previously on coumadin but discontinued as he has had ICH (2012), prior CVA with residual right sided weakness, HTN, CHF, DM, Hyperlipedemia, presents to the ER after having increased right sided weakness, causing him difficulty with walking.  He denied slurred speech, but stated that he noted a right facial droop as well.    Subjective Data  Subjective: I want to get to where I can walk with a cane.   Cognition  Cognition Arousal/Alertness: Awake/alert Behavior During Therapy: Flat  affect Overall Cognitive Status: Within Functional Limits for tasks assessed    Balance  Balance Balance Assessed:  Yes Static Standing Balance Static Standing - Balance Support: Bilateral upper extremity supported Static Standing - Level of Assistance: 4: Min assist Static Standing - Comment/# of Minutes: 3 minutes.  Requires UE support to maintain balance with wide BOS Rhomberg - Eyes Opened: 0 (Unable to get feet together to attempt)  End of Session PT - End of Session Equipment Utilized During Treatment: Gait belt Activity Tolerance: Patient tolerated treatment well Patient left: in chair;with call bell/phone within reach Nurse Communication: Mobility status   GP     Vena Austria 08/19/2012, 3:53 PM Durenda Hurt. Renaldo Fiddler, [facility] Acute Rehab Services Pager [phone]

## 2012-08-20 LAB — BASIC METABOLIC PANEL
CO2: 22 mEq/L (ref 19–32)
Chloride: 106 mEq/L (ref 96–112)
Glucose, Bld: 115 mg/dL — ABNORMAL HIGH (ref 70–99)
Potassium: 4.2 mEq/L (ref 3.5–5.1)
Sodium: 137 mEq/L (ref 135–145)

## 2012-08-20 LAB — GLUCOSE, CAPILLARY
Glucose-Capillary: 105 mg/dL — ABNORMAL HIGH (ref 70–99)
Glucose-Capillary: 119 mg/dL — ABNORMAL HIGH (ref 70–99)
Glucose-Capillary: 120 mg/dL — ABNORMAL HIGH (ref 70–99)

## 2012-08-20 MED ORDER — HYDROCODONE-ACETAMINOPHEN 5-325 MG PO TABS: 1.0000 | ORAL_TABLET | Freq: Four times a day (QID) | ORAL | Status: DC | PRN

## 2012-08-20 MED ORDER — DILTIAZEM HCL ER COATED BEADS 360 MG PO CP24
360.0000 mg | ORAL_CAPSULE | Freq: Every day | ORAL | Status: DC
  Administered 2012-08-20: 360 mg via ORAL
  Filled 2012-08-20: qty 1

## 2012-08-20 NOTE — Progress Notes (Addendum)
CSW spoke with patient's friend, Eber Jones re: transport home. Eber Jones is out of town and unable to transport patient home until tomorrow morning. Patient does not have a key to his house, therefore - patient would be unable to transport home via taxi. Dr. Arbutus Leas & RNCM, Gerre Scull made aware. CSW left message for Dr. Jacky Kindle.   Unice Bailey, LCSW Clinical Social Worker cell #: [phone] (weekend coverage)

## 2012-08-20 NOTE — Care Management (Signed)
   CARE MANAGEMENT NOTE 08/20/2012  Patient:  [name], [name]   Account Number:  [id number]  Date Initiated:  08/19/2012  Documentation initiated by:  Elmer Bales  Subjective/Objective Assessment:   Pt admitted for difficulty ambulating, right sided weakness.     Action/Plan:   Will follow for discharge needs.   Anticipated DC Date:  08/20/2012   Anticipated DC Plan:  HOME W HOME HEALTH SERVICES  In-house referral  Clinical Social Worker      DC Planning Services  CM consult      [facility] Choice  HOME HEALTH   Choice offered to / List presented to:          [facility] arranged  HH-2 PT  HH-3 OT      [facility] agency  [facility]   Status of service:  Completed, signed off Medicare Important Message given?   (If response is "NO", the following Medicare IM given date fields will be blank) Date Medicare IM given:   Date Additional Medicare IM given:    Discharge Disposition:  HOME W HOME HEALTH SERVICES  Per UR Regulation:  Reviewed for med. necessity/level of care/duration of stay  If discussed at Long Length of Stay Meetings, dates discussed:    Comments:  08/20/12 Duke Salvia [phone] Patient now for discharge home with home health services. Has friend/caregiver who assists at home. Spoke with patient who states he has a rolling walker at home. For further needs at home such as grab bars, asked patient to discuss with home health therapist when they come to his house. Chose [facility] for home health services. Spoke with [facility] rep to make aware of referral; [facility] is able to accept referral for PT/OT services. Made nursing and patient aware home health has been arranged.  08/19/12 1430 Elmer Bales RN, MSN, CM- Per Dr Don Perking request, CM contacted Melanee Spry from CIR to discuss status of CIR consult.  Per [place] Lathe physician will see patient this evening to determine appropriateness. Information relayed to Dr Tat.

## 2012-08-20 NOTE — Progress Notes (Signed)
Patient ready for discharge, assessments remained unchanged prior to discharge.[facility] will be following up with patient for [facility] needs.

## 2012-08-20 NOTE — Progress Notes (Signed)
CSW received call from UR RNCM, [name] re: discharge, patient is no longer meeting inpatient stay. CSW spoke with patient at bedside & confirmed plan for home with home health services (patient agreeable with [facility]). RNCM, Gerre Scull made aware. Patient states that he already privately pays for a girl to come out from 9am-11am to his home every morning to clean and cook for him. Per PT notes, patient is able to ambulate 80' with minimum assistance. Patient states that his friend, [name] (ph#: [phone]) will transport him home, patient gave permission for [name] to be contacted if needed. CSW explained to patient that discharge will most likely be this afternoon, as patient is no longer meeting inpatient criteria for stay. CSW signing off - no other CSW needs identified.   Barry Bailey, LCSW Clinical Social Worker cell #: [phone] (weekend coverage)

## 2012-08-20 NOTE — Discharge Summary (Signed)
Physician Discharge Summary  [name]:[id number] DOB: 1955/05/19 DOA: 08/17/2012  PCP: Jeanann Lewandowsky, MD  Admit date: 08/17/2012 Discharge date: 08/20/2012  Recommendations for Outpatient Follow-up:  1. Pt will need to follow up with PCP in 2 weeks post discharge 2. Please obtain BMP to evaluate electrolytes and kidney function 3. Please also check CBC to evaluate Hg and Hct levels 4. Follow up with neurology, Dr. Pearlean Brownie in one month  Discharge Diagnoses:  Principal Problem:   Stroke Active Problems:   Diabetes mellitus   A-fib   CAD (coronary artery disease)   Hyperlipemia   HTN (hypertension) Right hemiparesis  -MRI brain--negative for any acute findings, no new stroke  -His hemiparesis remained stable throughout the hospitalization. He was up this may be partly due to deconditioning. Physical therapy was consulted. -CT brain negative  -Hemoglobin A1c 6.0  -PT evaluation--recommended CIR; however, upon evaluation by the physiatry service, he did not qualify for inpatient rehabilitation -The patient was set up for home health physical therapy and occupational therapy -Continue aspirin for now as per neurology recommendation  Atrial fibrillation  -Rate controlled  -Continue carvedilol  -Not on Coumadin because of history of subarachnoid hemorrhage in 2012  -Continue diltiazem  Diabetes mellitus type 2  -continue NovoLog sliding scale  -may be able to restart metformin if renal function improves prior to discharge  Renal insufficiency/CKD stage II  -Continue monitor  -we'll give 1000 cc normal saline as the patient's serum creatinine has gradually increased  - creatinine improved to 1.26 on the day of discharge with fluid resuscitation Hypertension  -controlled  -Continue Altace, Aldactone, Cardura, Bi-dil  Right hip pain  -X-ray right hip--negative for any fracture or dislocation  -Prescription for Norco #30 with no refills was provided for the patient -The  patient was instructed to avoid any further NSAIDs do to his renal insufficiency Family Communication: Pt at beside     Discharge Condition: stable  Disposition:  Follow-up Information   Follow up with Jeanann Lewandowsky, MD. (call to make appointment after discharged)    Contact information:   [address] [place] [address] [phone]       Follow up with [facility]. (PT/OT services)    Contact information:   [address] [place] [address] [phone]       Diet:cardiac Wt Readings from Last 3 Encounters:  08/18/12 69.58 kg (153 lb 6.3 oz)  08/15/12 69.582 kg (153 lb 6.4 oz)  01/19/11 98 kg (216 lb 0.8 oz)    History of present illness:  57 y.o. male with hx of afib, previously on coumadin but discontinued as he has had ICH (2012), prior CVA with residual right sided weakness, HTN, CHF, DM, Hyperlipedemia, presents to the ER after having increased right sided weakness, causing him difficulty with walking. He denied slurred speech, but stated that he noted a right facial droop as well. Evaluation in the ER included a head CT with no acute process, a Cr of 1.4. Onset of his symptoms was 2 days prior to presentation. He said increased difficulty with walking as well as with use of his right upper extremity. He said no changes in speech or swallowing. Facial droop has become more noticeable. Neurology was consulted.     Consultants: neurology  Discharge Exam: Filed Vitals:   08/20/12 1400  BP: 98/73  Pulse: 69  Temp: 98.3 F (36.8 C)  Resp: 18   Filed Vitals:   08/20/12 0204 08/20/12 0500  08/20/12 1025 08/20/12 1400  BP: 112/89 114/88 101/78 98/73  Pulse: 89 77 71 69  Temp: 97.9 F (36.6 C) 97.3 F (36.3 C) 98.1 F (36.7 C) 98.3 F (36.8 C)  TempSrc: Oral Oral Oral Oral  Resp: 20 20 18 18   Height:      Weight:      SpO2: 96% 98% 99% 99%   General: A&O x 3, NAD, pleasant, cooperative Cardiovascular: RRR, no rub,  no gallop, no S3 Respiratory: CTAB, no wheeze, no rhonchi Abdomen:soft, nontender, nondistended, positive bowel sounds Extremities: No edema, No lymphangitis, no petechiae  Discharge Instructions      Discharge Orders   Future Appointments Provider Department Dept Phone   10/17/2012 10:30 AM [facility] Covering Provider [facility] Joan Flores [phone]   Future Orders Complete By Expires     Diet - low sodium heart healthy  As directed     Increase activity slowly  As directed         Medication List    STOP taking these medications       naproxen 500 MG tablet  Commonly known as:  NAPROSYN      TAKE these medications       allopurinol 100 MG tablet  Commonly known as:  ZYLOPRIM  Take 1 tablet (100 mg total) by mouth daily.     aspirin 81 MG tablet  Take 81 mg by mouth daily at 12 noon.     baclofen 10 MG tablet  Commonly known as:  LIORESAL  Take 0.5 tablets (5 mg total) by mouth 3 (three) times daily.     carvedilol 25 MG tablet  Commonly known as:  COREG  Take 1 tablet (25 mg total) by mouth 2 (two) times daily with a meal.     diltiazem 180 MG 24 hr tablet  Commonly known as:  CARDIZEM LA  Take 2 tablets (360 mg total) by mouth daily at 12 noon.     doxazosin 2 MG tablet  Commonly known as:  CARDURA  Take 1 tablet (2 mg total) by mouth at bedtime.     glucose monitoring kit monitoring kit  1 each by Does not apply route 4 (four) times daily - after meals and at bedtime. 1 month Diabetic Testing Supplies for QAC-QHS accuchecks.     HYDROcodone-acetaminophen 5-325 MG per tablet  Commonly known as:  NORCO  Take 1 tablet by mouth every 6 (six) hours as needed for pain.     isosorbide-hydrALAZINE 20-37.5 MG per tablet  Commonly known as:  BIDIL  Take 2 tablets by mouth 3 (three) times daily.     metFORMIN 850 MG tablet  Commonly known as:  GLUCOPHAGE  Take 1 tablet (850 mg total) by mouth 2 (two) times daily with a meal.      pantoprazole 40 MG tablet  Commonly known as:  PROTONIX  Take 1 tablet (40 mg total) by mouth daily at 12 noon.     pravastatin 20 MG tablet  Commonly known as:  PRAVACHOL  Take 20 mg by mouth every morning.     ramipril 10 MG capsule  Commonly known as:  ALTACE  Take 1 capsule (10 mg total) by mouth 2 (two) times daily.     spironolactone 25 MG tablet  Commonly known as:  ALDACTONE  Take 1 tablet (25 mg total) by mouth daily.         The results of significant diagnostics from this hospitalization (including imaging, microbiology,  ancillary and laboratory) are listed below for reference.    Significant Diagnostic Studies: Dg Hip Complete Right  08/18/2012   *RADIOLOGY REPORT*  Clinical Data: Right hip pain.  RIGHT HIP - COMPLETE 2+ VIEW  Comparison: None.  Findings: Imaged bones, joints and soft tissues appear normal.  IMPRESSION: Negative exam.   Original Report Authenticated By: Holley Dexter, M.D.   Ct Head Wo Contrast  08/17/2012   *RADIOLOGY REPORT*  Clinical Data: Extremity weakness  CT HEAD WITHOUT CONTRAST  Technique:  Contiguous axial images were obtained from the base of the skull through the vertex without contrast.  Comparison: 12/07/2010  Findings: No skull fracture is noted.  Paranasal sinuses and mastoid air cells are unremarkable.  There is encephalomalacia in the right cerebellum from prior infarct.  No intracranial hemorrhage, mass effect or midline shift.  No acute cortical infarction.  No mass lesion is noted on this unenhanced scan. Stable mild cerebral atrophy.  Ventricular size is stable from prior exam.  The stable mild periventricular chronic white matter disease.  IMPRESSION: No acute intracranial abnormality.  No definite acute cortical infarction.  There is encephalomalacia from prior infarct in the right cerebellum.   Original Report Authenticated By: Natasha Mead, M.D.   Mri Brain Without Contrast  08/18/2012   *RADIOLOGY REPORT*  Clinical Data: 57 year old  male with increased right side weakness. Prior stroke.  MRI HEAD WITHOUT CONTRAST  Technique:  Multiplanar, multiecho pulse sequences of the brain and surrounding structures were obtained according to standard protocol without intravenous contrast.  Comparison: Head CTs 08/17/2012.  Brain MRI 11/20/2010.  Findings: New but chronic right superior frontal gyrus blood products, situated anteriorly and tracking to the right lateral ventricle.  This probably reflects previous ventriculostomy.  Superimposed hemosiderin deposition at the right cerebellar infarct which was acute on the 2012 comparison.  Underlying advanced chronic micro hemorrhages in the brain, especially the deep gray matter nuclei and brain stem.  Brain stem involvement does appear progressed since 2012.  No restricted diffusion to suggest acute infarction.  No ventriculomegaly. No midline shift, mass effect, or evidence of mass lesion.  No acute intracranial hemorrhage identified. Negative pituitary, cervicomedullary junction visualized cervical spine.  Cystic encephalomalacia in the right cerebellum and dorsal brain stem corresponding to the 2012 infarct.  Superimposed chronic left cerebellar hemisphere lacunar infarct is stable.  Comparatively mild chronic cerebral white matter T2 and FLAIR hyperintensity is stable.  Small chronic cortically based frontal lobe and right parietal lobe infarcts are stable.  Major intracranial vascular flow voids are stable, with chronically abnormal appearance of the distal left vertebral artery and a degree of intracranial artery dolichoectasia.  Visualized orbit soft tissues are within normal limits.  Visualized paranasal sinuses and mastoids are clear.  Normal bone marrow signal.  Negative scalp soft tissues.  IMPRESSION: 1. No acute intracranial abnormality. 2.  Advanced chronic ischemic and small vessel disease, with both expected evolution since 2012 and also some progression (in the form of increased number of  chronic micro hemorrhages in the brain). 3.  Sequelae of right frontal ventriculostomy.   Original Report Authenticated By: Erskine Speed, M.D.     Microbiology: No results found for this or any previous visit (from the past 240 hour(s)).   Labs: Basic Metabolic Panel:  Recent Labs Lab 08/17/12 1833 08/17/12 2220 08/19/12 0545 08/20/12 0600  NA 137  --  139 137  K 4.5  --  3.9 4.2  CL 103  --  106 106  CO2  25  --  24 22  GLUCOSE 110*  --  102* 115*  BUN 27*  --  26* 25*  CREATININE 1.40* 1.21 1.46* 1.26  CALCIUM 10.1  --  9.5 9.4   Liver Function Tests: No results found for this basename: AST, ALT, ALKPHOS, BILITOT, PROT, ALBUMIN,  in the last 168 hours No results found for this basename: LIPASE, AMYLASE,  in the last 168 hours No results found for this basename: AMMONIA,  in the last 168 hours CBC:  Recent Labs Lab 08/17/12 1833 08/17/12 2220  WBC 5.7 5.9  NEUTROABS 2.3  --   HGB 14.3 14.2  HCT 40.5 39.5  MCV 88.6 88.0  PLT 211 234   Cardiac Enzymes: No results found for this basename: CKTOTAL, CKMB, CKMBINDEX, TROPONINI,  in the last 168 hours BNP: No components found with this basename: POCBNP,  CBG:  Recent Labs Lab 08/19/12 1646 08/19/12 2252 08/20/12 0654 08/20/12 1146 08/20/12 1633  GLUCAP 124* 139* 105* 119* 120*    Time coordinating discharge:  Greater than 30 minutes  Signed:  Lorenda Grecco, DO [facility] Pager: [phone] 08/20/2012, 5:07 PM

## 2012-08-20 NOTE — Progress Notes (Signed)
CSW spoke with patient re: alternative transportation. Patient realized that his private duty caregiver who comes in the mornings also has a key to his house. Patient provided caregiver, Dorann Lodge (ph#: [phone]) information - CSW called caregiver who states that she would be able to pickup patient this afternoon. Dr. Arbutus Leas made, RN & CSW supervisor made aware.   Unice Bailey, LCSW [facility] Clinical Social Worker cell #: [phone]

## 2012-09-02 ENCOUNTER — Ambulatory Visit: Payer: No Typology Code available for payment source | Attending: Family Medicine | Admitting: Internal Medicine

## 2012-09-02 ENCOUNTER — Ambulatory Visit (HOSPITAL_COMMUNITY)
Admission: RE | Admit: 2012-09-02 | Discharge: 2012-09-02 | Disposition: A | Discharge status: Home / Self Care | Payer: No Typology Code available for payment source | Source: Ambulatory Visit | Attending: Internal Medicine | Admitting: Internal Medicine

## 2012-09-02 VITALS — BP 96/64 | HR 72 | Temp 98.8°F | Resp 16

## 2012-09-02 DIAGNOSIS — I10 Essential (primary) hypertension: Secondary | ICD-10-CM

## 2012-09-02 DIAGNOSIS — M545 Low back pain, unspecified: Secondary | ICD-10-CM | POA: Insufficient documentation

## 2012-09-02 DIAGNOSIS — G8929 Other chronic pain: Secondary | ICD-10-CM

## 2012-09-02 DIAGNOSIS — M25559 Pain in unspecified hip: Secondary | ICD-10-CM | POA: Insufficient documentation

## 2012-09-02 MED ORDER — CIPROFLOXACIN HCL 500 MG PO TABS: 500.0000 mg | ORAL_TABLET | Freq: Two times a day (BID) | ORAL | Status: DC

## 2012-09-02 NOTE — Progress Notes (Unsigned)
Follow up Patient complains of having dark urine with strong odor Recently had a fall and would like to make sure he does not have a UTE

## 2012-09-02 NOTE — Progress Notes (Unsigned)
Patient ID: [name], male   DOB: 11/05/1955, 57 y.o.   MRN: [id number]  CC:  HPI: 57 year old male is here for a followup, recently admitted to the hospital for right-sided hemiparesis and CVA, has a history of atrial fibrillation, discharged on aspirin. Used to be on Coumadin which was stopped because of intracranial bleeding. Patient states that his weakness is improving and he is receiving home health therapy. He is also complaining of low back pain He is also complaining of dark colored urine and possible urinary tract infection  No Known Allergies Past Medical History  Diagnosis Date  . Diabetes mellitus   . Coronary artery disease   . Hypertension   . Gout   . CHF (congestive heart failure)   . Afib   . Arthritis   . Hypertensive emergency 11/19/2010  . Pulmonary edema 11/19/2010  . Acute exacerbation of congestive heart failure 11/19/2010  . Thyroiditis 11/20/2010  . ICH (intracerebral hemorrhage) 12/22/2010  . Stroke 11/19/2010  . Respiratory failure 11/19/2010  . CAD (coronary artery disease) 11/20/2010  . Physical deconditioning 12/22/2010  . Diabetes mellitus 11/19/2010  . A-fib 11/19/2010  . Hyperlipemia 11/21/2010  . Shortness of breath    Current Outpatient Prescriptions on File Prior to Visit  Medication Sig Dispense Refill  . allopurinol (ZYLOPRIM) 100 MG tablet Take 1 tablet (100 mg total) by mouth daily.  30 tablet  3  . aspirin 81 MG tablet Take 81 mg by mouth daily at 12 noon.      . baclofen (LIORESAL) 10 MG tablet Take 0.5 tablets (5 mg total) by mouth 3 (three) times daily.  45 tablet  1  . carvedilol (COREG) 25 MG tablet Take 1 tablet (25 mg total) by mouth 2 (two) times daily with a meal.  60 tablet  3  . diltiazem (CARDIZEM LA) 180 MG 24 hr tablet Take 2 tablets (360 mg total) by mouth daily at 12 noon.  60 tablet  3  . doxazosin (CARDURA) 2 MG tablet Take 1 tablet (2 mg total) by mouth at bedtime.  30 tablet  3  . glucose monitoring kit (FREESTYLE)  monitoring kit 1 each by Does not apply route 4 (four) times daily - after meals and at bedtime. 1 month Diabetic Testing Supplies for QAC-QHS accuchecks.  1 each  1  . HYDROcodone-acetaminophen (NORCO) 5-325 MG per tablet Take 1 tablet by mouth every 6 (six) hours as needed for pain.  30 tablet  0  . isosorbide-hydrALAZINE (BIDIL) 20-37.5 MG per tablet Take 2 tablets by mouth 3 (three) times daily.  90 tablet  3  . metFORMIN (GLUCOPHAGE) 850 MG tablet Take 1 tablet (850 mg total) by mouth 2 (two) times daily with a meal.  60 tablet  3  . pantoprazole (PROTONIX) 40 MG tablet Take 1 tablet (40 mg total) by mouth daily at 12 noon.  30 tablet  3  . pravastatin (PRAVACHOL) 20 MG tablet Take 20 mg by mouth every morning.      . ramipril (ALTACE) 10 MG capsule Take 1 capsule (10 mg total) by mouth 2 (two) times daily.  30 capsule  3  . spironolactone (ALDACTONE) 25 MG tablet Take 1 tablet (25 mg total) by mouth daily.  30 tablet  3  . [DISCONTINUED] insulin glargine (LANTUS) 100 UNIT/ML injection Inject 0.15 mLs (15 Units total) into the skin at bedtime.  12 mL  1  . [DISCONTINUED] potassium chloride (KLOR-CON M15) 15 MEQ tablet Take 2 tablets (  30 mEq total) by mouth 2 (two) times daily.  120 tablet  1  . [DISCONTINUED] rosuvastatin (CRESTOR) 5 MG tablet Place 1 tablet (5 mg total) into feeding tube daily at 6 PM.  30 tablet  2   No current facility-administered medications on file prior to visit.   No family history on file. History   Social History  . Marital Status: Single    Spouse Name: N/A    Number of Children: N/A  . Years of Education: N/A   Occupational History  . Not on file.   Social History Main Topics  . Smoking status: Former Smoker    Types: Cigarettes    Quit date: 12/19/2005  . Smokeless tobacco: Never Used  . Alcohol Use: No     Comment: quit in 2012  . Drug Use: No  . Sexual Activity: Not on file   Other Topics Concern  . Not on file   Social History Narrative  .  No narrative on file    Review of Systems  Constitutional: Negative for fever, chills, diaphoresis, activity change, appetite change and fatigue.  HENT: Negative for ear pain, nosebleeds, congestion, facial swelling, rhinorrhea, neck pain, neck stiffness and ear discharge.   Eyes: Negative for pain, discharge, redness, itching and visual disturbance.  Respiratory: Negative for cough, choking, chest tightness, shortness of breath, wheezing and stridor.   Cardiovascular: Negative for chest pain, palpitations and leg swelling.  Gastrointestinal: Negative for abdominal distention.  Genitourinary: Negative for dysuria, urgency, frequency, hematuria, flank pain, decreased urine volume, difficulty urinating and dyspareunia.  Musculoskeletal: Negative for back pain, joint swelling, arthralgias and gait problem.  Neurological: Negative for dizziness, tremors, seizures, syncope, facial asymmetry, speech difficulty, weakness, light-headedness, numbness and headaches.  Hematological: Negative for adenopathy. Does not bruise/bleed easily.  Psychiatric/Behavioral: Negative for hallucinations, behavioral problems, confusion, dysphoric mood, decreased concentration and agitation.    Objective:   Filed Vitals:   09/02/12 1235  BP: 96/64  Pulse: 72  Temp: 98.8 F (37.1 C)  Resp: 16    Physical Exam  Constitutional: Appears well-developed and well-nourished. No distress.  HENT: Normocephalic. External right and left ear normal. Oropharynx is clear and moist.  Eyes: Conjunctivae and EOM are normal. PERRLA, no scleral icterus.  Neck: Normal ROM. Neck supple. No JVD. No tracheal deviation. No thyromegaly.  CVS: RRR, S1/S2 +, no murmurs, no gallops, no carotid bruit.  Pulmonary: Effort and breath sounds normal, no stridor, rhonchi, wheezes, rales.  Abdominal: Soft. BS +,  no distension, tenderness, rebound or guarding.  Musculoskeletal: Normal range of motion. No edema and no tenderness.   Lymphadenopathy: No lymphadenopathy noted, cervical, inguinal. Neuro: Alert. Normal reflexes, muscle tone coordination. No cranial nerve deficit. Skin: Skin is warm and dry. No rash noted. Not diaphoretic. No erythema. No pallor.  Psychiatric: Normal mood and affect. Behavior, judgment, thought content normal.   Lab Results  Component Value Date   WBC 5.9 08/17/2012   HGB 14.2 08/17/2012   HCT 39.5 08/17/2012   MCV 88.0 08/17/2012   PLT 234 08/17/2012   Lab Results  Component Value Date   CREATININE 1.26 08/20/2012   BUN 25* 08/20/2012   NA 137 08/20/2012   K 4.2 08/20/2012   CL 106 08/20/2012   CO2 22 08/20/2012    Lab Results  Component Value Date   HGBA1C 6.1* 08/17/2012   Lipid Panel     Component Value Date/Time   CHOL 114 08/18/2012 0545   TRIG 98 08/18/2012 0545  HDL 46 08/18/2012 0545   CHOLHDL 2.5 08/18/2012 0545   VLDL 20 08/18/2012 0545   LDLCALC 48 08/18/2012 0545       Assessment and plan:   Patient Active Problem List   Diagnosis Date Noted  . Diabetes 08/15/2012  . Follow up 08/15/2012  . Pain in limb 08/15/2012  . HTN (hypertension) 07/01/2012  . ICH (intracerebral hemorrhage) 12/22/2010  . Hyperlipemia 11/21/2010  . Gout 11/20/2010  . CAD (coronary artery disease) 11/20/2010  . Stroke 11/19/2010  . Diabetes mellitus 11/19/2010  . A-fib 11/19/2010       Recent CVA Continue aspirin next he'll followup with Dr. [name] in 2 months  Dark-colored urine No urinary symptoms of flank pain, urgency frequency hematuria We'll check a UA If positive the patient will be treated forurinary tract infection   Diabetes hemoglobin A1c of 6.0 continue current regimen  Followup in 2 weeks  The patient was given clear instructions to go to ER or return to medical center if symptoms don't improve, worsen or new problems develop. The patient verbalized understanding. The patient was told to call to get any lab results if not heard anything in the next week.

## 2012-09-03 LAB — CBC WITH DIFFERENTIAL/PLATELET
Basophils Absolute: 0 K/uL (ref 0.0–0.1)
Basophils Relative: 1 % (ref 0–1)
Eosinophils Absolute: 0.1 K/uL (ref 0.0–0.7)
Eosinophils Relative: 1 % (ref 0–5)
HCT: 39 % (ref 39.0–52.0)
Hemoglobin: 13.1 g/dL (ref 13.0–17.0)
Lymphocytes Relative: 48 % — ABNORMAL HIGH (ref 12–46)
Lymphs Abs: 2.6 K/uL (ref 0.7–4.0)
MCH: 29.6 pg (ref 26.0–34.0)
MCHC: 33.6 g/dL (ref 30.0–36.0)
MCV: 88.2 fL (ref 78.0–100.0)
Monocytes Absolute: 0.4 K/uL (ref 0.1–1.0)
Monocytes Relative: 7 % (ref 3–12)
Neutro Abs: 2.3 K/uL (ref 1.7–7.7)
Neutrophils Relative %: 43 % (ref 43–77)
Platelets: 274 K/uL (ref 150–400)
RBC: 4.42 MIL/uL (ref 4.22–5.81)
RDW: 15 % (ref 11.5–15.5)
WBC: 5.3 K/uL (ref 4.0–10.5)

## 2012-09-03 LAB — COMPREHENSIVE METABOLIC PANEL WITH GFR
ALT: 12 U/L (ref 0–53)
AST: 11 U/L (ref 0–37)
Albumin: 4.2 g/dL (ref 3.5–5.2)
Alkaline Phosphatase: 53 U/L (ref 39–117)
BUN: 31 mg/dL — ABNORMAL HIGH (ref 6–23)
CO2: 23 meq/L (ref 19–32)
Calcium: 9.7 mg/dL (ref 8.4–10.5)
Chloride: 105 meq/L (ref 96–112)
Creat: 1.92 mg/dL — ABNORMAL HIGH (ref 0.50–1.35)
Glucose, Bld: 123 mg/dL — ABNORMAL HIGH (ref 70–99)
Potassium: 4.7 meq/L (ref 3.5–5.3)
Sodium: 139 meq/L (ref 135–145)
Total Bilirubin: 0.6 mg/dL (ref 0.3–1.2)
Total Protein: 7.1 g/dL (ref 6.0–8.3)

## 2012-09-03 LAB — URINALYSIS W MICROSCOPIC + REFLEX CULTURE
Nitrite: NEGATIVE
Protein, ur: NEGATIVE mg/dL
Squamous Epithelial / LPF: NONE SEEN
Urobilinogen, UA: 0.2 mg/dL (ref 0.0–1.0)

## 2012-09-03 LAB — LIPID PANEL
HDL: 38 mg/dL — ABNORMAL LOW (ref >39)
Total CHOL/HDL Ratio: 2.8 Ratio
Triglycerides: 135 mg/dL (ref <150)

## 2012-10-17 ENCOUNTER — Ambulatory Visit: Payer: Medicaid Other

## 2012-11-01 ENCOUNTER — Other Ambulatory Visit: Payer: Self-pay | Admitting: Emergency Medicine

## 2012-11-01 MED ORDER — PANTOPRAZOLE SODIUM 40 MG PO TBEC: 40.0000 mg | DELAYED_RELEASE_TABLET | Freq: Every day | ORAL | Status: DC

## 2012-11-17 ENCOUNTER — Other Ambulatory Visit: Payer: Self-pay

## 2012-11-30 ENCOUNTER — Other Ambulatory Visit: Payer: Self-pay | Admitting: Internal Medicine

## 2012-11-30 DIAGNOSIS — Z09 Encounter for follow-up examination after completed treatment for conditions other than malignant neoplasm: Secondary | ICD-10-CM

## 2012-11-30 DIAGNOSIS — E119 Type 2 diabetes mellitus without complications: Secondary | ICD-10-CM

## 2012-12-05 ENCOUNTER — Other Ambulatory Visit: Payer: Self-pay | Admitting: Internal Medicine

## 2012-12-05 MED ORDER — PANTOPRAZOLE SODIUM 40 MG PO TBEC: 40.0000 mg | DELAYED_RELEASE_TABLET | Freq: Every day | ORAL | Status: DC

## 2012-12-05 MED ORDER — CARVEDILOL 25 MG PO TABS: 25.0000 mg | ORAL_TABLET | Freq: Two times a day (BID) | ORAL | Status: DC

## 2012-12-05 NOTE — Telephone Encounter (Signed)
Medication has been changed per Dr. Susie Cassette

## 2012-12-05 NOTE — Addendum Note (Signed)
Addended by: Susie Cassette MD, Germain Osgood on: 12/05/2012 11:04 AM   Modules accepted: Orders, Medications

## 2012-12-07 ENCOUNTER — Encounter: Payer: Self-pay | Admitting: Internal Medicine

## 2012-12-07 ENCOUNTER — Ambulatory Visit: Payer: No Typology Code available for payment source | Attending: Internal Medicine | Admitting: Internal Medicine

## 2012-12-07 VITALS — BP 118/79 | HR 76 | Temp 97.6°F | Resp 16

## 2012-12-07 DIAGNOSIS — I2510 Atherosclerotic heart disease of native coronary artery without angina pectoris: Secondary | ICD-10-CM | POA: Insufficient documentation

## 2012-12-07 DIAGNOSIS — Z139 Encounter for screening, unspecified: Secondary | ICD-10-CM

## 2012-12-07 DIAGNOSIS — Z09 Encounter for follow-up examination after completed treatment for conditions other than malignant neoplasm: Secondary | ICD-10-CM

## 2012-12-07 DIAGNOSIS — E119 Type 2 diabetes mellitus without complications: Secondary | ICD-10-CM | POA: Insufficient documentation

## 2012-12-07 DIAGNOSIS — K219 Gastro-esophageal reflux disease without esophagitis: Secondary | ICD-10-CM | POA: Insufficient documentation

## 2012-12-07 DIAGNOSIS — I4891 Unspecified atrial fibrillation: Secondary | ICD-10-CM | POA: Insufficient documentation

## 2012-12-07 DIAGNOSIS — I10 Essential (primary) hypertension: Secondary | ICD-10-CM | POA: Insufficient documentation

## 2012-12-07 DIAGNOSIS — Z23 Encounter for immunization: Secondary | ICD-10-CM

## 2012-12-07 LAB — POCT GLYCOSYLATED HEMOGLOBIN (HGB A1C): Hemoglobin A1C: 5.9

## 2012-12-07 MED ORDER — ALLOPURINOL 100 MG PO TABS: 100.0000 mg | ORAL_TABLET | Freq: Every day | ORAL | Status: DC

## 2012-12-07 MED ORDER — DILTIAZEM HCL ER COATED BEADS 180 MG PO TB24: 360.0000 mg | ORAL_TABLET | Freq: Every day | ORAL | Status: DC

## 2012-12-07 MED ORDER — RAMIPRIL 10 MG PO CAPS: ORAL_CAPSULE | ORAL | Status: DC

## 2012-12-07 MED ORDER — BACLOFEN 10 MG PO TABS: 5.0000 mg | ORAL_TABLET | Freq: Three times a day (TID) | ORAL | Status: DC

## 2012-12-07 MED ORDER — FREESTYLE SYSTEM KIT: 1.0000 | PACK | Freq: Three times a day (TID) | Status: DC

## 2012-12-07 MED ORDER — ISOSORB DINITRATE-HYDRALAZINE 20-37.5 MG PO TABS: 2.0000 | ORAL_TABLET | Freq: Three times a day (TID) | ORAL | Status: DC

## 2012-12-07 MED ORDER — PANTOPRAZOLE SODIUM 40 MG PO TBEC: 40.0000 mg | DELAYED_RELEASE_TABLET | Freq: Every day | ORAL | Status: DC

## 2012-12-07 MED ORDER — SPIRONOLACTONE 25 MG PO TABS: 25.0000 mg | ORAL_TABLET | Freq: Every day | ORAL | Status: DC

## 2012-12-07 MED ORDER — METFORMIN HCL 850 MG PO TABS: 850.0000 mg | ORAL_TABLET | Freq: Two times a day (BID) | ORAL | Status: DC

## 2012-12-07 MED ORDER — CARVEDILOL 25 MG PO TABS: 25.0000 mg | ORAL_TABLET | Freq: Two times a day (BID) | ORAL | Status: DC

## 2012-12-07 MED ORDER — ASPIRIN 81 MG PO TABS: 81.0000 mg | ORAL_TABLET | Freq: Every day | ORAL | Status: DC

## 2012-12-07 MED ORDER — DOXAZOSIN MESYLATE 2 MG PO TABS: 2.0000 mg | ORAL_TABLET | Freq: Every day | ORAL | Status: DC

## 2012-12-07 MED ORDER — CIPROFLOXACIN HCL 500 MG PO TABS: 500.0000 mg | ORAL_TABLET | Freq: Two times a day (BID) | ORAL | Status: DC

## 2012-12-07 MED ORDER — PRAVASTATIN SODIUM 20 MG PO TABS: 20.0000 mg | ORAL_TABLET | Freq: Every morning | ORAL | Status: DC

## 2012-12-07 MED ORDER — HYDROCODONE-ACETAMINOPHEN 5-325 MG PO TABS: 1.0000 | ORAL_TABLET | Freq: Four times a day (QID) | ORAL | Status: DC | PRN

## 2012-12-07 NOTE — Progress Notes (Signed)
Patient here for follow up History of DM  chf cva A fib

## 2012-12-07 NOTE — Progress Notes (Signed)
MRN: [id number] Name: [name]  Sex: male Age: 57 y.o. DOB: [birth year]  Allergies: Review of patient's allergies indicates no known allergies.  Chief Complaint  Patient presents with  . Follow-up    HPI: Patient is 57 y.o. male who  comes today for followup, history of diabetes hypertension gout A. fib history of stroke hyperlipidemia, CAD, patient denies any headache dizziness chest pain or shortness of breath, he has not seen a cardiologist, currently on aspirin for afib, not on Coumadin because of  history of ICH, patient is compliant with his medications and is requesting refill on the medications. Also patient would like to see a Child psychotherapist.  Past Medical History  Diagnosis Date  . Diabetes mellitus   . Coronary artery disease   . Hypertension   . Gout   . CHF (congestive heart failure)   . Afib   . Arthritis   . Hypertensive emergency 11/19/2010  . Pulmonary edema 11/19/2010  . Acute exacerbation of congestive heart failure 11/19/2010  . Thyroiditis 11/20/2010  . ICH (intracerebral hemorrhage) 12/22/2010  . Stroke 11/19/2010  . Respiratory failure 11/19/2010  . CAD (coronary artery disease) 11/20/2010  . Physical deconditioning 12/22/2010  . Diabetes mellitus 11/19/2010  . A-fib 11/19/2010  . Hyperlipemia 11/21/2010  . Shortness of breath     Past Surgical History  Procedure Laterality Date  . Tracheostomy tube placement  11/28/2010    Procedure: TRACHEOSTOMY;  Surgeon: Susy Frizzle, MD;  Location: [facility] OR;  Service: ENT;  Laterality: N/A;  . Peg placement  12/03/2010    Procedure: PERCUTANEOUS ENDOSCOPIC GASTROSTOMY (PEG) PLACEMENT;  Surgeon: Hart Carwin, MD;  Location: [facility] ENDOSCOPY;  Service: Endoscopy;  Laterality: N/A;      Medication List       This list is accurate as of: 12/07/12 10:33 AM.  Always use your most recent med list.               allopurinol 100 MG tablet  Commonly known as:  ZYLOPRIM  Take 1 tablet (100 mg total) by mouth daily.      aspirin 81 MG tablet  Take 1 tablet (81 mg total) by mouth daily at 12 noon.     aspirin 81 MG tablet  Take 1 tablet (81 mg total) by mouth daily at 12 noon.     baclofen 10 MG tablet  Commonly known as:  LIORESAL  Take 0.5 tablets (5 mg total) by mouth 3 (three) times daily.     baclofen 10 MG tablet  Commonly known as:  LIORESAL  Take 0.5 tablets (5 mg total) by mouth 3 (three) times daily.     carvedilol 25 MG tablet  Commonly known as:  COREG  Take 1 tablet (25 mg total) by mouth 2 (two) times daily with a meal.     carvedilol 25 MG tablet  Commonly known as:  COREG  Take 1 tablet (25 mg total) by mouth 2 (two) times daily with a meal.     ciprofloxacin 500 MG tablet  Commonly known as:  CIPRO  Take 1 tablet (500 mg total) by mouth 2 (two) times daily.     diltiazem 180 MG 24 hr tablet  Commonly known as:  CARDIZEM LA  Take 2 tablets (360 mg total) by mouth daily at 12 noon.     diltiazem 180 MG 24 hr tablet  Commonly known as:  CARDIZEM LA  Take 2 tablets (360 mg total) by mouth daily at 12  noon.     doxazosin 2 MG tablet  Commonly known as:  CARDURA  Take 1 tablet (2 mg total) by mouth at bedtime.     doxazosin 2 MG tablet  Commonly known as:  CARDURA  Take 1 tablet (2 mg total) by mouth at bedtime.     glucose monitoring kit monitoring kit  1 each by Does not apply route 4 (four) times daily - after meals and at bedtime. 1 month Diabetic Testing Supplies for QAC-QHS accuchecks.     HYDROcodone-acetaminophen 5-325 MG per tablet  Commonly known as:  NORCO  Take 1 tablet by mouth every 6 (six) hours as needed.     isosorbide-hydrALAZINE 20-37.5 MG per tablet  Commonly known as:  BIDIL  Take 2 tablets by mouth 3 (three) times daily.     isosorbide-hydrALAZINE 20-37.5 MG per tablet  Commonly known as:  BIDIL  Take 2 tablets by mouth 3 (three) times daily.     isosorbide-hydrALAZINE 20-37.5 MG per tablet  Commonly known as:  BIDIL  Take 2 tablets by mouth 3  (three) times daily.     metFORMIN 850 MG tablet  Commonly known as:  GLUCOPHAGE  Take 1 tablet (850 mg total) by mouth 2 (two) times daily with a meal.     metFORMIN 850 MG tablet  Commonly known as:  GLUCOPHAGE  Take 1 tablet (850 mg total) by mouth 2 (two) times daily with a meal.     pantoprazole 40 MG tablet  Commonly known as:  PROTONIX  Take 1 tablet (40 mg total) by mouth daily at 12 noon.     pantoprazole 40 MG tablet  Commonly known as:  PROTONIX  Take 1 tablet (40 mg total) by mouth daily at 12 noon.     pravastatin 20 MG tablet  Commonly known as:  PRAVACHOL  Take 1 tablet (20 mg total) by mouth every morning.     pravastatin 20 MG tablet  Commonly known as:  PRAVACHOL  Take 1 tablet (20 mg total) by mouth every morning.     ramipril 10 MG capsule  Commonly known as:  ALTACE  TAKE ONE CAPSULE BY MOUTH TWICE DAILY     ramipril 10 MG capsule  Commonly known as:  ALTACE  TAKE ONE CAPSULE BY MOUTH TWICE DAILY     spironolactone 25 MG tablet  Commonly known as:  ALDACTONE  Take 1 tablet (25 mg total) by mouth daily.     spironolactone 25 MG tablet  Commonly known as:  ALDACTONE  Take 1 tablet (25 mg total) by mouth daily.        Meds ordered this encounter  Medications  . DISCONTD: allopurinol (ZYLOPRIM) 100 MG tablet    Sig: Take 1 tablet (100 mg total) by mouth daily.    Dispense:  30 tablet    Refill:  3    To prevent gout flareup  . aspirin 81 MG tablet    Sig: Take 1 tablet (81 mg total) by mouth daily at 12 noon.    Dispense:  30 tablet    Refill:  2  . baclofen (LIORESAL) 10 MG tablet    Sig: Take 0.5 tablets (5 mg total) by mouth 3 (three) times daily.    Dispense:  45 tablet    Refill:  1    To prevent hiccups.  In a few days try to taper to twice a day for a week. If hiccups do not recur then decrease to  once a day as tolerated.  . carvedilol (COREG) 25 MG tablet    Sig: Take 1 tablet (25 mg total) by mouth 2 (two) times daily with a  meal.    Dispense:  120 tablet    Refill:  3  . ciprofloxacin (CIPRO) 500 MG tablet    Sig: Take 1 tablet (500 mg total) by mouth 2 (two) times daily.    Dispense:  20 tablet    Refill:  0  . diltiazem (CARDIZEM LA) 180 MG 24 hr tablet    Sig: Take 2 tablets (360 mg total) by mouth daily at 12 noon.    Dispense:  60 tablet    Refill:  3  . DISCONTD: doxazosin (CARDURA) 2 MG tablet    Sig: Take 1 tablet (2 mg total) by mouth at bedtime.    Dispense:  30 tablet    Refill:  3  . glucose monitoring kit (FREESTYLE) monitoring kit    Sig: 1 each by Does not apply route 4 (four) times daily - after meals and at bedtime. 1 month Diabetic Testing Supplies for QAC-QHS accuchecks.    Dispense:  1 each    Refill:  1  . isosorbide-hydrALAZINE (BIDIL) 20-37.5 MG per tablet    Sig: Take 2 tablets by mouth 3 (three) times daily.    Dispense:  90 tablet    Refill:  3    For heart.  Marland Kitchen DISCONTD: metFORMIN (GLUCOPHAGE) 850 MG tablet    Sig: Take 1 tablet (850 mg total) by mouth 2 (two) times daily with a meal.    Dispense:  60 tablet    Refill:  3  . pantoprazole (PROTONIX) 40 MG tablet    Sig: Take 1 tablet (40 mg total) by mouth daily at 12 noon.    Dispense:  120 tablet    Refill:  3  . pravastatin (PRAVACHOL) 20 MG tablet    Sig: Take 1 tablet (20 mg total) by mouth every morning.    Dispense:  30 tablet    Refill:  2  . ramipril (ALTACE) 10 MG capsule    Sig: TAKE ONE CAPSULE BY MOUTH TWICE DAILY    Dispense:  30 capsule    Refill:  0  . spironolactone (ALDACTONE) 25 MG tablet    Sig: Take 1 tablet (25 mg total) by mouth daily.    Dispense:  30 tablet    Refill:  3  . allopurinol (ZYLOPRIM) 100 MG tablet    Sig: Take 1 tablet (100 mg total) by mouth daily.    Dispense:  30 tablet    Refill:  3    To prevent gout flareup  . aspirin 81 MG tablet    Sig: Take 1 tablet (81 mg total) by mouth daily at 12 noon.    Dispense:  30 tablet    Refill:  3  . baclofen (LIORESAL) 10 MG tablet     Sig: Take 0.5 tablets (5 mg total) by mouth 3 (three) times daily.    Dispense:  45 tablet    Refill:  1    To prevent hiccups.  In a few days try to taper to twice a day for a week. If hiccups do not recur then decrease to  once a day as tolerated.  . carvedilol (COREG) 25 MG tablet    Sig: Take 1 tablet (25 mg total) by mouth 2 (two) times daily with a meal.    Dispense:  120 tablet  Refill:  3  . diltiazem (CARDIZEM LA) 180 MG 24 hr tablet    Sig: Take 2 tablets (360 mg total) by mouth daily at 12 noon.    Dispense:  60 tablet    Refill:  3  . doxazosin (CARDURA) 2 MG tablet    Sig: Take 1 tablet (2 mg total) by mouth at bedtime.    Dispense:  30 tablet    Refill:  3  . HYDROcodone-acetaminophen (NORCO) 5-325 MG per tablet    Sig: Take 1 tablet by mouth every 6 (six) hours as needed.    Dispense:  30 tablet    Refill:  0  . isosorbide-hydrALAZINE (BIDIL) 20-37.5 MG per tablet    Sig: Take 2 tablets by mouth 3 (three) times daily.    Dispense:  90 tablet    Refill:  3    For heart.  . metFORMIN (GLUCOPHAGE) 850 MG tablet    Sig: Take 1 tablet (850 mg total) by mouth 2 (two) times daily with a meal.    Dispense:  60 tablet    Refill:  3  . pantoprazole (PROTONIX) 40 MG tablet    Sig: Take 1 tablet (40 mg total) by mouth daily at 12 noon.    Dispense:  120 tablet    Refill:  3  . pravastatin (PRAVACHOL) 20 MG tablet    Sig: Take 1 tablet (20 mg total) by mouth every morning.    Dispense:  30 tablet    Refill:  3  . ramipril (ALTACE) 10 MG capsule    Sig: TAKE ONE CAPSULE BY MOUTH TWICE DAILY    Dispense:  30 capsule    Refill:  0  . spironolactone (ALDACTONE) 25 MG tablet    Sig: Take 1 tablet (25 mg total) by mouth daily.    Dispense:  30 tablet    Refill:  3  . doxazosin (CARDURA) 2 MG tablet    Sig: Take 1 tablet (2 mg total) by mouth at bedtime.    Dispense:  30 tablet    Refill:  3  . isosorbide-hydrALAZINE (BIDIL) 20-37.5 MG per tablet    Sig: Take 2  tablets by mouth 3 (three) times daily.    Dispense:  90 tablet    Refill:  3    For heart.  . metFORMIN (GLUCOPHAGE) 850 MG tablet    Sig: Take 1 tablet (850 mg total) by mouth 2 (two) times daily with a meal.    Dispense:  60 tablet    Refill:  3    Immunization History  Administered Date(s) Administered  . Influenza Whole 11/21/2010  . Influenza,inj,Quad PF,36+ Mos 12/07/2012  . Pneumococcal Polysaccharide-23 11/21/2010    History  Substance Use Topics  . Smoking status: Former Smoker    Types: Cigarettes    Quit date: 12/19/2005  . Smokeless tobacco: Never Used  . Alcohol Use: No     Comment: quit in 2012    Review of Systems  As noted in HPI  Filed Vitals:   12/07/12 0953  BP: 118/79  Pulse: 76  Temp: 97.6 F (36.4 C)  Resp: 16    Physical Exam  Physical Exam  Constitutional: He is oriented to person, place, and time.  Patient is in wheelchair not in acute distress  Cardiovascular:  Irregularly irregular  Pulmonary/Chest: He has no wheezes. He has no rales.  Musculoskeletal: He exhibits no edema.  Neurological: He is alert and oriented to person, place, and time.  CBC    Component Value Date/Time   WBC 5.3 09/02/2012 1251   RBC 4.42 09/02/2012 1251   HGB 13.1 09/02/2012 1251   HCT 39.0 09/02/2012 1251   PLT 274 09/02/2012 1251   MCV 88.2 09/02/2012 1251   LYMPHSABS 2.6 09/02/2012 1251   MONOABS 0.4 09/02/2012 1251   EOSABS 0.1 09/02/2012 1251   BASOSABS 0.0 09/02/2012 1251    CMP     Component Value Date/Time   NA 139 09/02/2012 1251   K 4.7 09/02/2012 1251   CL 105 09/02/2012 1251   CO2 23 09/02/2012 1251   GLUCOSE 123* 09/02/2012 1251   BUN 31* 09/02/2012 1251   CREATININE 1.92* 09/02/2012 1251   CREATININE 1.26 08/20/2012 0600   CALCIUM 9.7 09/02/2012 1251   PROT 7.1 09/02/2012 1251   ALBUMIN 4.2 09/02/2012 1251   AST 11 09/02/2012 1251   ALT 12 09/02/2012 1251   ALKPHOS 53 09/02/2012 1251   BILITOT 0.6 09/02/2012 1251   GFRNONAA 61* 08/20/2012 0600    GFRAA 71* 08/20/2012 0600    Lab Results  Component Value Date/Time   CHOL 108 09/02/2012 12:51 PM    No components found with this basename: hga1c    Lab Results  Component Value Date/Time   AST 11 09/02/2012 12:51 PM    Assessment and Plan  Follow up - Plan:  Medication refill  Done corvedilol (COREG) 25 MG tablet, diltiazem (CARDIZEM LA) 180 MG 24 hr tablet, isosorbide-hydrALAZINE (BIDIL) 20-37.5 MG per tablet, spironolactone (ALDACTONE) 25 MG tablet, allopurinol (ZYLOPRIM) 100 MG tablet, carvedilol (COREG) 25 MG tablet, diltiazem (CARDIZEM LA) 180 MG 24 hr tablet, doxazosin (CARDURA) 2 MG tablet, isosorbide-hydrALAZINE (BIDIL) 20-37.5 MG per tablet, metFORMIN (GLUCOPHAGE) 850 MG tablet, spironolactone (ALDACTONE) 25 MG tablet, doxazosin (CARDURA) 2 MG tablet, isosorbide-hydrALAZINE (BIDIL) 20-37.5 MG per tablet, metFORMIN (GLUCOPHAGE) 850 MG tablet, DISCONTINUED: allopurinol (ZYLOPRIM) 100 MG tablet, DISCONTINUED: doxazosin (CARDURA) 2 MG tablet, DISCONTINUED: metFORMIN (GLUCOPHAGE) 850 MG tablet  Diabetes mellitus - Plan: Glucose (CBG), HgB A1c is 5.9% continue current medications. ,   CAD (coronary artery disease) - Plan: pravastatin (PRAVACHOL) 20 MG tablet, Ambulatory referral to Cardiology, COMPLETE METABOLIC PANEL WITH GFR  A-fib - continue with aspirin  Plan: Ambulatory referral to Cardiology  HTN (hypertension) blood pressure is well controlled  GERD (gastroesophageal reflux disease) - Plan: pantoprazole (PROTONIX) 40 MG tablet  Screening -  Patient is requesting for the test  as he is concerned since he has family history of prostate cancer,  PSA. Ordered.  Flu shot given today    Return in about 3 months (around 03/09/2013).  Doris Cheadle, MD

## 2012-12-08 LAB — COMPLETE METABOLIC PANEL WITH GFR
Albumin: 4.3 g/dL (ref 3.5–5.2)
Alkaline Phosphatase: 55 U/L (ref 39–117)
CO2: 28 mEq/L (ref 19–32)
Creat: 1.19 mg/dL (ref 0.50–1.35)
GFR, Est African American: 77 mL/min
GFR, Est Non African American: 67 mL/min
Glucose, Bld: 118 mg/dL — ABNORMAL HIGH (ref 70–99)
Potassium: 4.6 mEq/L (ref 3.5–5.3)
Sodium: 135 mEq/L (ref 135–145)
Total Bilirubin: 0.4 mg/dL (ref 0.3–1.2)
Total Protein: 7.1 g/dL (ref 6.0–8.3)

## 2012-12-08 LAB — PSA: PSA: 3.04 ng/mL (ref <=4.00)

## 2012-12-14 ENCOUNTER — Other Ambulatory Visit: Payer: Self-pay | Admitting: Internal Medicine

## 2012-12-14 ENCOUNTER — Telehealth: Payer: Self-pay | Admitting: Internal Medicine

## 2012-12-14 DIAGNOSIS — E119 Type 2 diabetes mellitus without complications: Secondary | ICD-10-CM

## 2012-12-14 DIAGNOSIS — I2510 Atherosclerotic heart disease of native coronary artery without angina pectoris: Secondary | ICD-10-CM

## 2012-12-14 DIAGNOSIS — K219 Gastro-esophageal reflux disease without esophagitis: Secondary | ICD-10-CM

## 2012-12-14 DIAGNOSIS — Z09 Encounter for follow-up examination after completed treatment for conditions other than malignant neoplasm: Secondary | ICD-10-CM

## 2012-12-14 MED ORDER — PANTOPRAZOLE SODIUM 40 MG PO TBEC: 40.0000 mg | DELAYED_RELEASE_TABLET | Freq: Every day | ORAL | Status: DC

## 2012-12-14 MED ORDER — METFORMIN HCL 850 MG PO TABS: 850.0000 mg | ORAL_TABLET | Freq: Two times a day (BID) | ORAL | Status: DC

## 2012-12-14 MED ORDER — DILTIAZEM HCL ER COATED BEADS 180 MG PO TB24: 360.0000 mg | ORAL_TABLET | Freq: Every day | ORAL | Status: DC

## 2012-12-14 MED ORDER — ALLOPURINOL 100 MG PO TABS: 100.0000 mg | ORAL_TABLET | Freq: Every day | ORAL | Status: DC

## 2012-12-14 MED ORDER — DOXAZOSIN MESYLATE 2 MG PO TABS: 2.0000 mg | ORAL_TABLET | Freq: Every day | ORAL | Status: DC

## 2012-12-14 MED ORDER — CIPROFLOXACIN HCL 500 MG PO TABS: 500.0000 mg | ORAL_TABLET | Freq: Two times a day (BID) | ORAL | Status: DC

## 2012-12-14 MED ORDER — PRAVASTATIN SODIUM 20 MG PO TABS: 20.0000 mg | ORAL_TABLET | Freq: Every morning | ORAL | Status: DC

## 2012-12-14 MED ORDER — ASPIRIN 81 MG PO TABS: 81.0000 mg | ORAL_TABLET | Freq: Every day | ORAL | Status: DC

## 2012-12-14 MED ORDER — BACLOFEN 10 MG PO TABS: 5.0000 mg | ORAL_TABLET | Freq: Three times a day (TID) | ORAL | Status: DC

## 2012-12-14 MED ORDER — SPIRONOLACTONE 25 MG PO TABS: 25.0000 mg | ORAL_TABLET | Freq: Every day | ORAL | Status: DC

## 2012-12-14 MED ORDER — UNABLE TO FIND: Status: DC

## 2012-12-14 MED ORDER — RAMIPRIL 10 MG PO CAPS: ORAL_CAPSULE | ORAL | Status: DC

## 2012-12-14 MED ORDER — ISOSORB DINITRATE-HYDRALAZINE 20-37.5 MG PO TABS: 2.0000 | ORAL_TABLET | Freq: Three times a day (TID) | ORAL | Status: DC

## 2012-12-14 MED ORDER — CARVEDILOL 25 MG PO TABS: 25.0000 mg | ORAL_TABLET | Freq: Two times a day (BID) | ORAL | Status: DC

## 2012-12-14 NOTE — Telephone Encounter (Addendum)
Pt friend, Ms Darin Engels, calling says that pharmacy, [facility] [employer], has not received all the scripts he needed. Also says that provider listed has is not authorized by Medicaid ([place]).  Please f/u with Ms Darin Engels.

## 2012-12-15 NOTE — Telephone Encounter (Signed)
Medication taken care of by Dr. Susie Cassette 12/14/12

## 2012-12-21 ENCOUNTER — Ambulatory Visit: Payer: No Typology Code available for payment source | Admitting: Cardiology

## 2012-12-22 NOTE — Telephone Encounter (Signed)
Pt's friend calling again. Says [facility] pharmacy still saying that scripts are not authorized by Medicaid even though a Medicaid authorized physician signed scripts.     Spoke with [facility] pharmacy rep who says to re-fax scripts to pharmacy because they did not receive scripts that were faxed over on 12/14/12.  Scripts were faxed.  Will call to confirm they were received.

## 2012-12-26 ENCOUNTER — Telehealth: Payer: Self-pay | Admitting: *Deleted

## 2012-12-28 ENCOUNTER — Ambulatory Visit: Payer: No Typology Code available for payment source | Attending: Cardiology | Admitting: Cardiology

## 2012-12-28 ENCOUNTER — Encounter: Payer: Self-pay | Admitting: Cardiology

## 2012-12-28 VITALS — BP 104/70 | HR 81 | Temp 97.9°F | Resp 16

## 2012-12-28 DIAGNOSIS — I2510 Atherosclerotic heart disease of native coronary artery without angina pectoris: Secondary | ICD-10-CM

## 2012-12-28 DIAGNOSIS — I4891 Unspecified atrial fibrillation: Secondary | ICD-10-CM

## 2012-12-28 DIAGNOSIS — I5032 Chronic diastolic (congestive) heart failure: Secondary | ICD-10-CM | POA: Insufficient documentation

## 2012-12-28 DIAGNOSIS — I639 Cerebral infarction, unspecified: Secondary | ICD-10-CM

## 2012-12-28 DIAGNOSIS — E785 Hyperlipidemia, unspecified: Secondary | ICD-10-CM

## 2012-12-28 DIAGNOSIS — I509 Heart failure, unspecified: Secondary | ICD-10-CM | POA: Insufficient documentation

## 2012-12-28 DIAGNOSIS — E119 Type 2 diabetes mellitus without complications: Secondary | ICD-10-CM

## 2012-12-28 DIAGNOSIS — I6350 Cerebral infarction due to unspecified occlusion or stenosis of unspecified cerebral artery: Secondary | ICD-10-CM

## 2012-12-28 NOTE — Assessment & Plan Note (Signed)
Stable clinically with no angina or ischemic symptoms. Continue secondary preventative therapy. Diabetes and lipids under good control. Blood pressure also good control.

## 2012-12-28 NOTE — Assessment & Plan Note (Signed)
Aggressively controlled. No change in therapy.

## 2012-12-28 NOTE — Progress Notes (Signed)
HPI [name] is a 57 year old African American male referred today by Dr. Orpah Cobb for the evaluation and establishment of his cardiac care.  He has a history of coronary artery disease, chronic diastolic heart failure, history of difficult control hypertension, history of chronic A. fib, history of a intra-cerebral hemorrhagic bleed while on Coumadin, currently on aspirin, type 2 diabetes is well controlled with a hemoglobin A1c recently of 5.9, hyperlipidemia with aggressive good control of his lipids by recent check, and COPD but quit smoking several years ago.  He denies any angina or ischemic symptoms. He case he gets a spasm in the center of his chest when he lies down. He has a history of a tracheotomy and also feeding tube placement. He is able to raise himself, speaks and swallows normally. He is pretty much confined to a chair and is in a wheelchair today. His brother brought him in today.  He denies orthopnea, PND or edema.  Echocardiogram in August 2004 showed an EF of 60% with mild mitral regurgitation. He also had noninvasive vascular studies which showed nonobstructive disease. EKG shows chronic A. fib with ST segment changes with deep T wave inversion laterally.   Past Medical History  Diagnosis Date  . Diabetes mellitus   . Coronary artery disease   . Hypertension   . Gout   . CHF (congestive heart failure)   . Afib   . Arthritis   . Hypertensive emergency 11/19/2010  . Pulmonary edema 11/19/2010  . Acute exacerbation of congestive heart failure 11/19/2010  . Thyroiditis 11/20/2010  . ICH (intracerebral hemorrhage) 12/22/2010  . Stroke 11/19/2010  . Respiratory failure 11/19/2010  . CAD (coronary artery disease) 11/20/2010  . Physical deconditioning 12/22/2010  . Diabetes mellitus 11/19/2010  . A-fib 11/19/2010  . Hyperlipemia 11/21/2010  . Shortness of breath     Current Outpatient Prescriptions  Medication Sig Dispense Refill  . allopurinol (ZYLOPRIM) 100 MG tablet Take 1  tablet (100 mg total) by mouth daily.  30 tablet  3  . aspirin 81 MG tablet Take 1 tablet (81 mg total) by mouth daily at 12 noon.  30 tablet  2  . aspirin 81 MG tablet Take 1 tablet (81 mg total) by mouth daily at 12 noon.  30 tablet  3  . baclofen (LIORESAL) 10 MG tablet Take 0.5 tablets (5 mg total) by mouth 3 (three) times daily.  45 tablet  1  . baclofen (LIORESAL) 10 MG tablet Take 0.5 tablets (5 mg total) by mouth 3 (three) times daily.  45 tablet  1  . carvedilol (COREG) 25 MG tablet Take 1 tablet (25 mg total) by mouth 2 (two) times daily with a meal.  120 tablet  3  . carvedilol (COREG) 25 MG tablet Take 1 tablet (25 mg total) by mouth 2 (two) times daily with a meal.  120 tablet  3  . ciprofloxacin (CIPRO) 500 MG tablet Take 1 tablet (500 mg total) by mouth 2 (two) times daily.  20 tablet  0  . diltiazem (CARDIZEM LA) 180 MG 24 hr tablet Take 2 tablets (360 mg total) by mouth daily at 12 noon.  60 tablet  3  . diltiazem (CARDIZEM LA) 180 MG 24 hr tablet Take 2 tablets (360 mg total) by mouth daily at 12 noon.  60 tablet  3  . doxazosin (CARDURA) 2 MG tablet Take 1 tablet (2 mg total) by mouth at bedtime.  30 tablet  3  . doxazosin (CARDURA) 2 MG  tablet Take 1 tablet (2 mg total) by mouth at bedtime.  30 tablet  3  . glucose monitoring kit (FREESTYLE) monitoring kit 1 each by Does not apply route 4 (four) times daily - after meals and at bedtime. 1 month Diabetic Testing Supplies for QAC-QHS accuchecks.  1 each  1  . HYDROcodone-acetaminophen (NORCO) 5-325 MG per tablet Take 1 tablet by mouth every 6 (six) hours as needed.  30 tablet  0  . isosorbide-hydrALAZINE (BIDIL) 20-37.5 MG per tablet Take 2 tablets by mouth 3 (three) times daily.  90 tablet  3  . isosorbide-hydrALAZINE (BIDIL) 20-37.5 MG per tablet Take 2 tablets by mouth 3 (three) times daily.  90 tablet  3  . isosorbide-hydrALAZINE (BIDIL) 20-37.5 MG per tablet Take 2 tablets by mouth 3 (three) times daily.  90 tablet  3  .  metFORMIN (GLUCOPHAGE) 850 MG tablet Take 1 tablet (850 mg total) by mouth 2 (two) times daily with a meal.  60 tablet  3  . metFORMIN (GLUCOPHAGE) 850 MG tablet Take 1 tablet (850 mg total) by mouth 2 (two) times daily with a meal.  60 tablet  3  . pantoprazole (PROTONIX) 40 MG tablet Take 1 tablet (40 mg total) by mouth daily at 12 noon.  120 tablet  3  . pantoprazole (PROTONIX) 40 MG tablet Take 1 tablet (40 mg total) by mouth daily at 12 noon.  120 tablet  3  . pravastatin (PRAVACHOL) 20 MG tablet Take 1 tablet (20 mg total) by mouth every morning.  30 tablet  2  . pravastatin (PRAVACHOL) 20 MG tablet Take 1 tablet (20 mg total) by mouth every morning.  30 tablet  3  . ramipril (ALTACE) 10 MG capsule TAKE ONE CAPSULE BY MOUTH TWICE DAILY  30 capsule  0  . ramipril (ALTACE) 10 MG capsule TAKE ONE CAPSULE BY MOUTH TWICE DAILY  30 capsule  0  . spironolactone (ALDACTONE) 25 MG tablet Take 1 tablet (25 mg total) by mouth daily.  30 tablet  3  . spironolactone (ALDACTONE) 25 MG tablet Take 1 tablet (25 mg total) by mouth daily.  30 tablet  3  . UNABLE TO FIND Power wheelchair seating evaluation  DX. History of stroke and pain in limbs  1 each  0  . [DISCONTINUED] insulin glargine (LANTUS) 100 UNIT/ML injection Inject 0.15 mLs (15 Units total) into the skin at bedtime.  12 mL  1  . [DISCONTINUED] potassium chloride (KLOR-CON M15) 15 MEQ tablet Take 2 tablets (30 mEq total) by mouth 2 (two) times daily.  120 tablet  1  . [DISCONTINUED] rosuvastatin (CRESTOR) 5 MG tablet Place 1 tablet (5 mg total) into feeding tube daily at 6 PM.  30 tablet  2   No current facility-administered medications for this visit.    No Known Allergies  No family history on file.  History   Social History  . Marital Status: Single    Spouse Name: N/A    Number of Children: N/A  . Years of Education: N/A   Occupational History  . Not on file.   Social History Main Topics  . Smoking status: Former Smoker     Types: Cigarettes    Quit date: 12/19/2005  . Smokeless tobacco: Never Used  . Alcohol Use: No     Comment: quit in 2012  . Drug Use: No  . Sexual Activity: Not on file   Other Topics Concern  . Not on file   Social  History Narrative  . No narrative on file    ROS ALL NEGATIVE EXCEPT THOSE NOTED IN HPI  PE  General Appearance: well developed, well nourished in no acute distress, sitting in a wheelchair, mildly dysarthric, HEENT: symmetrical face, PERRLA, good dentition  Neck: no JVD, thyromegaly, or adenopathy, trachea midline, old tracheostomy scar Chest: symmetric without deformity Cardiac: PMI non-displaced, irregular rate and rhythm normal S1, S2, no gallop or murmur Lung: clear to ausculation and percussion Vascular: Pulses in the lower extremities reduced but palpable. No edema  Abdominal: nondistended, nontender, good bowel sounds, no HSM, no bruits Extremities: no cyanosis, clubbing or edema, no sign of DVT, no varicosities  Skin: normal color, no rashes Neuro: alert and oriented x 3, non-focal Pysch: normal affect  EKG Not repeated BMET    Component Value Date/Time   NA 135 12/07/2012 1031   K 4.6 12/07/2012 1031   CL 101 12/07/2012 1031   CO2 28 12/07/2012 1031   GLUCOSE 118* 12/07/2012 1031   BUN 23 12/07/2012 1031   CREATININE 1.19 12/07/2012 1031   CREATININE 1.26 08/20/2012 0600   CALCIUM 10.2 12/07/2012 1031   GFRNONAA 61* 08/20/2012 0600   GFRAA 71* 08/20/2012 0600    Lipid Panel     Component Value Date/Time   CHOL 108 09/02/2012 1251   TRIG 135 09/02/2012 1251   HDL 38* 09/02/2012 1251   CHOLHDL 2.8 09/02/2012 1251   VLDL 27 09/02/2012 1251   LDLCALC 43 09/02/2012 1251    CBC    Component Value Date/Time   WBC 5.3 09/02/2012 1251   RBC 4.42 09/02/2012 1251   HGB 13.1 09/02/2012 1251   HCT 39.0 09/02/2012 1251   PLT 274 09/02/2012 1251   MCV 88.2 09/02/2012 1251   MCH 29.6 09/02/2012 1251   MCHC 33.6 09/02/2012 1251   RDW 15.0 09/02/2012 1251    LYMPHSABS 2.6 09/02/2012 1251   MONOABS 0.4 09/02/2012 1251   EOSABS 0.1 09/02/2012 1251   BASOSABS 0.0 09/02/2012 1251

## 2012-12-28 NOTE — Progress Notes (Signed)
Pt here to f/u with Dr. Daleen Squibb Hx CAD,chronic heart failure  Taking prescribed medication C/o intermit muscle chest spasms x 2 mnths,nonradiating

## 2012-12-28 NOTE — Assessment & Plan Note (Signed)
Rate well controlled and on aspirin. Not an anticoagulation candidate secondary to intracerebral bleed. Asymptomatic.

## 2012-12-28 NOTE — Assessment & Plan Note (Signed)
Excellent control. Continue current therapy including ACE inhibitor.

## 2012-12-29 NOTE — Telephone Encounter (Signed)
CSW completed paperwork for PCS and SCAT applications.  PCS application sent and SCAT app to be completed.    CSW will continue to follow to further support patient's needs.  Beverly Sessions MSW, LCSW

## 2013-02-15 ENCOUNTER — Telehealth: Payer: Self-pay | Admitting: Internal Medicine

## 2013-02-15 DIAGNOSIS — Z09 Encounter for follow-up examination after completed treatment for conditions other than malignant neoplasm: Secondary | ICD-10-CM

## 2013-02-15 DIAGNOSIS — I10 Essential (primary) hypertension: Secondary | ICD-10-CM

## 2013-02-15 DIAGNOSIS — E119 Type 2 diabetes mellitus without complications: Secondary | ICD-10-CM

## 2013-02-15 NOTE — Telephone Encounter (Signed)
Pt needs refill for diltiazem (CARDIZEM LA) 180 MG 24 hr tablet.  Pt uses [facility] representative on [address].

## 2013-02-20 MED ORDER — DILTIAZEM HCL ER COATED BEADS 180 MG PO TB24: 360.0000 mg | ORAL_TABLET | Freq: Every day | ORAL | Status: DC

## 2013-02-20 NOTE — Telephone Encounter (Signed)
Patient not available Left message that prescription was sent to pharmacy [facility] [address]

## 2013-03-09 ENCOUNTER — Ambulatory Visit: Payer: No Typology Code available for payment source | Admitting: Internal Medicine

## 2013-04-05 ENCOUNTER — Ambulatory Visit: Payer: Medicaid Other | Admitting: Cardiology

## 2013-04-07 ENCOUNTER — Encounter: Payer: Self-pay | Admitting: Internal Medicine

## 2013-04-07 ENCOUNTER — Ambulatory Visit: Payer: Medicare Other | Attending: Internal Medicine | Admitting: Internal Medicine

## 2013-04-07 VITALS — BP 126/83 | HR 76 | Temp 98.3°F | Resp 15

## 2013-04-07 DIAGNOSIS — I10 Essential (primary) hypertension: Secondary | ICD-10-CM | POA: Insufficient documentation

## 2013-04-07 DIAGNOSIS — Z09 Encounter for follow-up examination after completed treatment for conditions other than malignant neoplasm: Secondary | ICD-10-CM

## 2013-04-07 DIAGNOSIS — K219 Gastro-esophageal reflux disease without esophagitis: Secondary | ICD-10-CM | POA: Insufficient documentation

## 2013-04-07 DIAGNOSIS — E119 Type 2 diabetes mellitus without complications: Secondary | ICD-10-CM | POA: Insufficient documentation

## 2013-04-07 DIAGNOSIS — E785 Hyperlipidemia, unspecified: Secondary | ICD-10-CM

## 2013-04-07 DIAGNOSIS — I2510 Atherosclerotic heart disease of native coronary artery without angina pectoris: Secondary | ICD-10-CM

## 2013-04-07 DIAGNOSIS — M109 Gout, unspecified: Secondary | ICD-10-CM | POA: Insufficient documentation

## 2013-04-07 LAB — GLUCOSE, POCT (MANUAL RESULT ENTRY): POC GLUCOSE: 101 mg/dL — AB (ref 70–99)

## 2013-04-07 LAB — POCT GLYCOSYLATED HEMOGLOBIN (HGB A1C): Hemoglobin A1C: 6.1

## 2013-04-07 MED ORDER — PRAVASTATIN SODIUM 20 MG PO TABS: 20.0000 mg | ORAL_TABLET | Freq: Every morning | ORAL | Status: DC

## 2013-04-07 MED ORDER — ALLOPURINOL 100 MG PO TABS: 100.0000 mg | ORAL_TABLET | Freq: Every day | ORAL | Status: DC

## 2013-04-07 MED ORDER — CARVEDILOL 25 MG PO TABS: 25.0000 mg | ORAL_TABLET | Freq: Two times a day (BID) | ORAL | Status: DC

## 2013-04-07 MED ORDER — SPIRONOLACTONE 25 MG PO TABS: 25.0000 mg | ORAL_TABLET | Freq: Every day | ORAL | Status: DC

## 2013-04-07 MED ORDER — RAMIPRIL 10 MG PO CAPS: ORAL_CAPSULE | ORAL | Status: DC

## 2013-04-07 MED ORDER — DILTIAZEM HCL ER COATED BEADS 180 MG PO TB24: 360.0000 mg | ORAL_TABLET | Freq: Every day | ORAL | Status: DC

## 2013-04-07 MED ORDER — DOXAZOSIN MESYLATE 2 MG PO TABS: 2.0000 mg | ORAL_TABLET | Freq: Every day | ORAL | Status: DC

## 2013-04-07 MED ORDER — METFORMIN HCL 850 MG PO TABS: 850.0000 mg | ORAL_TABLET | Freq: Two times a day (BID) | ORAL | Status: DC

## 2013-04-07 MED ORDER — ISOSORB DINITRATE-HYDRALAZINE 20-37.5 MG PO TABS: 2.0000 | ORAL_TABLET | Freq: Three times a day (TID) | ORAL | Status: DC

## 2013-04-07 MED ORDER — PANTOPRAZOLE SODIUM 40 MG PO TBEC: 40.0000 mg | DELAYED_RELEASE_TABLET | Freq: Every day | ORAL | Status: DC

## 2013-04-07 MED ORDER — BACLOFEN 10 MG PO TABS: 5.0000 mg | ORAL_TABLET | Freq: Three times a day (TID) | ORAL | Status: DC

## 2013-04-07 MED ORDER — ASPIRIN 81 MG PO TABS: 81.0000 mg | ORAL_TABLET | Freq: Every day | ORAL | Status: DC

## 2013-04-07 NOTE — Progress Notes (Signed)
MRN: [id number] Name: [name]  Sex: male Age: 58 y.o. DOB: 1955-02-07  Allergies: Review of patient's allergies indicates no known allergies.  Chief Complaint  Patient presents with  . Follow-up    HPI: Patient is 58 y.o. male who has history of hypertension CAD/A. Fib(on aspirin, history of intracranial hemorrhage)  following up with the cardiologist, history of diabetes gout hyperlipidemia, comes today for followup, denies any acute symptoms, denies any hypoglycemic symptoms as per patient his fasting sugar is between 90-110 mg a deciliter, today his hemoglobin A1c is 6.1%, his blood pressure is well controlled. Patient is requesting refill on her medications.  Past Medical History  Diagnosis Date  . Diabetes mellitus   . Coronary artery disease   . Hypertension   . Gout   . CHF (congestive heart failure)   . Afib   . Arthritis   . Hypertensive emergency 11/19/2010  . Pulmonary edema 11/19/2010  . Acute exacerbation of congestive heart failure 11/19/2010  . Thyroiditis 11/20/2010  . ICH (intracerebral hemorrhage) 12/22/2010  . Stroke 11/19/2010  . Respiratory failure 11/19/2010  . CAD (coronary artery disease) 11/20/2010  . Physical deconditioning 12/22/2010  . Diabetes mellitus 11/19/2010  . A-fib 11/19/2010  . Hyperlipemia 11/21/2010  . Shortness of breath     Past Surgical History  Procedure Laterality Date  . Tracheostomy tube placement  11/28/2010    Procedure: TRACHEOSTOMY;  Surgeon: Beckie Salts, MD;  Location: [place] [facility];  Service: ENT;  Laterality: N/A;  . Peg placement  12/03/2010    Procedure: PERCUTANEOUS ENDOSCOPIC GASTROSTOMY (PEG) PLACEMENT;  Surgeon: Lafayette Dragon, MD;  Location: [facility] ENDOSCOPY;  Service: Endoscopy;  Laterality: N/A;      Medication List       This list is accurate as of: 04/07/13  2:40 PM.  Always use your most recent med list.               allopurinol 100 MG tablet  Commonly known as:  ZYLOPRIM  Take 1 tablet (100 mg total) by  mouth daily.     aspirin 81 MG tablet  Take 1 tablet (81 mg total) by mouth daily at 12 noon.     baclofen 10 MG tablet  Commonly known as:  LIORESAL  Take 0.5 tablets (5 mg total) by mouth 3 (three) times daily.     carvedilol 25 MG tablet  Commonly known as:  COREG  Take 1 tablet (25 mg total) by mouth 2 (two) times daily with a meal.     diltiazem 180 MG 24 hr tablet  Commonly known as:  CARDIZEM LA  Take 2 tablets (360 mg total) by mouth daily at 12 noon.     doxazosin 2 MG tablet  Commonly known as:  CARDURA  Take 1 tablet (2 mg total) by mouth at bedtime.     glucose monitoring kit monitoring kit  1 each by Does not apply route 4 (four) times daily - after meals and at bedtime. 1 month Diabetic Testing Supplies for QAC-QHS accuchecks.     HYDROcodone-acetaminophen 5-325 MG per tablet  Commonly known as:  NORCO  Take 1 tablet by mouth every 6 (six) hours as needed.     isosorbide-hydrALAZINE 20-37.5 MG per tablet  Commonly known as:  BIDIL  Take 2 tablets by mouth 3 (three) times daily.     isosorbide-hydrALAZINE 20-37.5 MG per tablet  Commonly known as:  BIDIL  Take 2 tablets by mouth 3 (three) times  daily.     metFORMIN 850 MG tablet  Commonly known as:  GLUCOPHAGE  Take 1 tablet (850 mg total) by mouth 2 (two) times daily with a meal.     pantoprazole 40 MG tablet  Commonly known as:  PROTONIX  Take 1 tablet (40 mg total) by mouth daily at 12 noon.     pravastatin 20 MG tablet  Commonly known as:  PRAVACHOL  Take 1 tablet (20 mg total) by mouth every morning.     ramipril 10 MG capsule  Commonly known as:  ALTACE  TAKE ONE CAPSULE BY MOUTH TWICE DAILY     ramipril 10 MG capsule  Commonly known as:  ALTACE  TAKE ONE CAPSULE BY MOUTH TWICE DAILY     spironolactone 25 MG tablet  Commonly known as:  ALDACTONE  Take 1 tablet (25 mg total) by mouth daily.     UNABLE TO FIND  - Power wheelchair seating evaluation  -   - DX. History of stroke and pain  in limbs        Meds ordered this encounter  Medications  . allopurinol (ZYLOPRIM) 100 MG tablet    Sig: Take 1 tablet (100 mg total) by mouth daily.    Dispense:  30 tablet    Refill:  3    To prevent gout flareup  . aspirin 81 MG tablet    Sig: Take 1 tablet (81 mg total) by mouth daily at 12 noon.    Dispense:  30 tablet    Refill:  3  . baclofen (LIORESAL) 10 MG tablet    Sig: Take 0.5 tablets (5 mg total) by mouth 3 (three) times daily.    Dispense:  45 tablet    Refill:  1    To prevent hiccups.  In a few days try to taper to twice a day for a week. If hiccups do not recur then decrease to  once a day as tolerated.  . carvedilol (COREG) 25 MG tablet    Sig: Take 1 tablet (25 mg total) by mouth 2 (two) times daily with a meal.    Dispense:  120 tablet    Refill:  3  . diltiazem (CARDIZEM LA) 180 MG 24 hr tablet    Sig: Take 2 tablets (360 mg total) by mouth daily at 12 noon.    Dispense:  60 tablet    Refill:  0  . doxazosin (CARDURA) 2 MG tablet    Sig: Take 1 tablet (2 mg total) by mouth at bedtime.    Dispense:  30 tablet    Refill:  3  . isosorbide-hydrALAZINE (BIDIL) 20-37.5 MG per tablet    Sig: Take 2 tablets by mouth 3 (three) times daily.    Dispense:  90 tablet    Refill:  3    For heart.  . metFORMIN (GLUCOPHAGE) 850 MG tablet    Sig: Take 1 tablet (850 mg total) by mouth 2 (two) times daily with a meal.    Dispense:  60 tablet    Refill:  3  . pantoprazole (PROTONIX) 40 MG tablet    Sig: Take 1 tablet (40 mg total) by mouth daily at 12 noon.    Dispense:  120 tablet    Refill:  3  . pravastatin (PRAVACHOL) 20 MG tablet    Sig: Take 1 tablet (20 mg total) by mouth every morning.    Dispense:  30 tablet    Refill:  3  . ramipril (  ALTACE) 10 MG capsule    Sig: TAKE ONE CAPSULE BY MOUTH TWICE DAILY    Dispense:  30 capsule    Refill:  3  . spironolactone (ALDACTONE) 25 MG tablet    Sig: Take 1 tablet (25 mg total) by mouth daily.    Dispense:  30  tablet    Refill:  3    Immunization History  Administered Date(s) Administered  . Influenza Whole 11/21/2010  . Influenza,inj,Quad PF,36+ Mos 12/07/2012  . Pneumococcal Polysaccharide-23 11/21/2010    History reviewed. No pertinent family history.  History  Substance Use Topics  . Smoking status: Former Smoker    Types: Cigarettes    Quit date: 12/19/2005  . Smokeless tobacco: Never Used  . Alcohol Use: No     Comment: quit in 2012    Review of Systems   As noted in HPI  Filed Vitals:   04/07/13 1425  BP: 126/83  Pulse: 76  Temp: 98.3 F (36.8 C)  Resp: 15    Physical Exam  Physical Exam  Constitutional: He is oriented to person, place, and time. No distress.  Patient is in wheelchair  Eyes: EOM are normal. Pupils are equal, round, and reactive to light.  Cardiovascular: Normal rate.   regularly irregular   Pulmonary/Chest: He has no wheezes. He has no rales.  Musculoskeletal: He exhibits no edema.  Neurological: He is alert and oriented to person, place, and time.    CBC    Component Value Date/Time   WBC 5.3 09/02/2012 1251   RBC 4.42 09/02/2012 1251   HGB 13.1 09/02/2012 1251   HCT 39.0 09/02/2012 1251   PLT 274 09/02/2012 1251   MCV 88.2 09/02/2012 1251   LYMPHSABS 2.6 09/02/2012 1251   MONOABS 0.4 09/02/2012 1251   EOSABS 0.1 09/02/2012 1251   BASOSABS 0.0 09/02/2012 1251    CMP     Component Value Date/Time   NA 135 12/07/2012 1031   K 4.6 12/07/2012 1031   CL 101 12/07/2012 1031   CO2 28 12/07/2012 1031   GLUCOSE 118* 12/07/2012 1031   BUN 23 12/07/2012 1031   CREATININE 1.19 12/07/2012 1031   CREATININE 1.26 08/20/2012 0600   CALCIUM 10.2 12/07/2012 1031   PROT 7.1 12/07/2012 1031   ALBUMIN 4.3 12/07/2012 1031   AST 12 12/07/2012 1031   ALT 10 12/07/2012 1031   ALKPHOS 55 12/07/2012 1031   BILITOT 0.4 12/07/2012 1031   GFRNONAA 67 12/07/2012 1031   GFRNONAA 61* 08/20/2012 0600   GFRAA 77 12/07/2012 1031   GFRAA 71* 08/20/2012 0600     Lab Results  Component Value Date/Time   CHOL 108 09/02/2012 12:51 PM    No components found with this basename: hga1c    Lab Results  Component Value Date/Time   AST 12 12/07/2012 10:31 AM    Assessment and Plan  Diabetes mellitus - Plan: Glucose (CBG), HgB A1c, carvedilol (COREG) 25 MG tablet, diltiazem (CARDIZEM LA) 180 MG 24 hr tablet, doxazosin (CARDURA) 2 MG tablet, isosorbide-hydrALAZINE (BIDIL) 20-37.5 MG per tablet, metFORMIN (GLUCOPHAGE) 850 MG tablet, spironolactone (ALDACTONE) 25 MG tablet  Results for orders placed in visit on 04/07/13  GLUCOSE, POCT (MANUAL RESULT ENTRY)      Result Value Ref Range   POC Glucose 101 (*) 70 - 99 mg/dl  POCT GLYCOSYLATED HEMOGLOBIN (HGB A1C)      Result Value Ref Range   Hemoglobin A1C 6.1      Diabetes is well controlled continue with current  medications.  HTN (hypertension) - Plan: diltiazem (CARDIZEM LA) 180 MG 24 hr tablet  CAD (coronary artery disease) - Plan: aspirin 81 MG tablet, pravastatin (PRAVACHOL) 20 MG tablet  Hyperlipemia   Diabetes - Plan: carvedilol (COREG) 25 MG tablet, diltiazem (CARDIZEM LA) 180 MG 24 hr tablet, doxazosin (CARDURA) 2 MG tablet, isosorbide-hydrALAZINE (BIDIL) 20-37.5 MG per tablet, metFORMIN (GLUCOPHAGE) 850 MG tablet, spironolactone (ALDACTONE) 25 MG tablet  GERD (gastroesophageal reflux disease) - Plan: pantoprazole (PROTONIX) 40 MG tablet  Gout - Plan: allopurinol (ZYLOPRIM) 100 MG tablet    Return in about 4 months (around 08/07/2013) for diabetes, hypertension, CAD.  Lorayne Marek, MD

## 2013-04-07 NOTE — Progress Notes (Signed)
Patient here for follow up-DM Needs medication refill

## 2013-04-12 ENCOUNTER — Ambulatory Visit: Payer: Medicaid Other | Admitting: Cardiology

## 2013-04-20 ENCOUNTER — Other Ambulatory Visit: Payer: Self-pay

## 2013-04-24 ENCOUNTER — Ambulatory Visit: Payer: No Typology Code available for payment source

## 2013-04-25 ENCOUNTER — Ambulatory Visit: Payer: Medicare Other

## 2013-04-25 ENCOUNTER — Ambulatory Visit: Payer: No Typology Code available for payment source | Attending: Internal Medicine

## 2013-04-25 ENCOUNTER — Ambulatory Visit: Payer: Medicare Other | Attending: Internal Medicine

## 2013-04-25 DIAGNOSIS — Z0000 Encounter for general adult medical examination without abnormal findings: Secondary | ICD-10-CM

## 2013-04-25 LAB — BASIC METABOLIC PANEL
BUN: 24 mg/dL — AB (ref 6–23)
CALCIUM: 10 mg/dL (ref 8.4–10.5)
CO2: 28 mEq/L (ref 19–32)
CREATININE: 1.26 mg/dL (ref 0.50–1.35)
Chloride: 101 mEq/L (ref 96–112)
Glucose, Bld: 121 mg/dL — ABNORMAL HIGH (ref 70–99)
Potassium: 4.4 mEq/L (ref 3.5–5.3)
Sodium: 139 mEq/L (ref 135–145)

## 2013-04-25 LAB — COMPLETE METABOLIC PANEL WITH GFR
ALBUMIN: 4.1 g/dL (ref 3.5–5.2)
ALT: <8 U/L (ref 0–53)
AST: 11 U/L (ref 0–37)
Alkaline Phosphatase: 55 U/L (ref 39–117)
BILIRUBIN TOTAL: 0.3 mg/dL (ref 0.2–1.2)
BUN: 24 mg/dL — ABNORMAL HIGH (ref 6–23)
CALCIUM: 10 mg/dL (ref 8.4–10.5)
CO2: 28 mEq/L (ref 19–32)
Chloride: 101 mEq/L (ref 96–112)
Creat: 1.26 mg/dL (ref 0.50–1.35)
GFR, EST NON AFRICAN AMERICAN: 62 mL/min
GFR, Est African American: 72 mL/min
GLUCOSE: 121 mg/dL — AB (ref 70–99)
POTASSIUM: 4.4 meq/L (ref 3.5–5.3)
Sodium: 139 mEq/L (ref 135–145)
TOTAL PROTEIN: 7.1 g/dL (ref 6.0–8.3)

## 2013-05-03 ENCOUNTER — Other Ambulatory Visit: Payer: Self-pay | Admitting: Internal Medicine

## 2013-05-08 ENCOUNTER — Other Ambulatory Visit: Payer: Self-pay | Admitting: Internal Medicine

## 2013-05-08 ENCOUNTER — Other Ambulatory Visit: Payer: Self-pay

## 2013-05-08 MED ORDER — PRAVASTATIN SODIUM 20 MG PO TABS: ORAL_TABLET | ORAL | Status: DC

## 2013-05-15 ENCOUNTER — Other Ambulatory Visit: Payer: Self-pay | Admitting: Internal Medicine

## 2013-05-24 ENCOUNTER — Other Ambulatory Visit: Payer: Self-pay | Admitting: Internal Medicine

## 2013-05-24 DIAGNOSIS — I4891 Unspecified atrial fibrillation: Secondary | ICD-10-CM

## 2013-06-07 ENCOUNTER — Other Ambulatory Visit: Payer: Self-pay | Admitting: Internal Medicine

## 2013-07-05 ENCOUNTER — Other Ambulatory Visit: Payer: Self-pay | Admitting: Emergency Medicine

## 2013-07-05 DIAGNOSIS — E119 Type 2 diabetes mellitus without complications: Secondary | ICD-10-CM

## 2013-07-05 DIAGNOSIS — Z09 Encounter for follow-up examination after completed treatment for conditions other than malignant neoplasm: Secondary | ICD-10-CM

## 2013-07-05 MED ORDER — ISOSORB DINITRATE-HYDRALAZINE 20-37.5 MG PO TABS: 2.0000 | ORAL_TABLET | Freq: Three times a day (TID) | ORAL | Status: DC

## 2013-07-11 ENCOUNTER — Other Ambulatory Visit: Payer: Self-pay | Admitting: Internal Medicine

## 2013-07-12 ENCOUNTER — Other Ambulatory Visit: Payer: Self-pay | Admitting: *Deleted

## 2013-07-12 DIAGNOSIS — E119 Type 2 diabetes mellitus without complications: Secondary | ICD-10-CM

## 2013-07-12 DIAGNOSIS — Z09 Encounter for follow-up examination after completed treatment for conditions other than malignant neoplasm: Secondary | ICD-10-CM

## 2013-07-12 MED ORDER — ISOSORB DINITRATE-HYDRALAZINE 20-37.5 MG PO TABS: 2.0000 | ORAL_TABLET | Freq: Three times a day (TID) | ORAL | Status: DC

## 2013-07-19 ENCOUNTER — Other Ambulatory Visit: Payer: Self-pay

## 2013-07-19 MED ORDER — DILTIAZEM HCL ER COATED BEADS 180 MG PO CP24: ORAL_CAPSULE | ORAL | Status: DC

## 2013-07-19 MED ORDER — PRAVASTATIN SODIUM 20 MG PO TABS: ORAL_TABLET | ORAL | Status: DC

## 2013-07-19 MED ORDER — LISINOPRIL 20 MG PO TABS: ORAL_TABLET | ORAL | Status: DC

## 2013-07-31 ENCOUNTER — Other Ambulatory Visit: Payer: Self-pay | Admitting: *Deleted

## 2013-07-31 DIAGNOSIS — Z09 Encounter for follow-up examination after completed treatment for conditions other than malignant neoplasm: Secondary | ICD-10-CM

## 2013-07-31 DIAGNOSIS — E119 Type 2 diabetes mellitus without complications: Secondary | ICD-10-CM

## 2013-07-31 MED ORDER — ISOSORB DINITRATE-HYDRALAZINE 20-37.5 MG PO TABS: 2.0000 | ORAL_TABLET | Freq: Three times a day (TID) | ORAL | Status: DC

## 2013-07-31 MED ORDER — METFORMIN HCL 850 MG PO TABS
850.0000 mg | ORAL_TABLET | Freq: Two times a day (BID) | ORAL | Status: DC
Start: 2013-07-31 — End: 2013-08-08

## 2013-07-31 MED ORDER — METFORMIN HCL 850 MG PO TABS: 850.0000 mg | ORAL_TABLET | Freq: Two times a day (BID) | ORAL | Status: DC

## 2013-08-08 ENCOUNTER — Encounter: Payer: Self-pay | Admitting: Internal Medicine

## 2013-08-08 ENCOUNTER — Ambulatory Visit: Payer: Medicare Other | Attending: Internal Medicine | Admitting: Internal Medicine

## 2013-08-08 VITALS — BP 110/76 | HR 70 | Temp 97.9°F | Resp 15

## 2013-08-08 DIAGNOSIS — Z7982 Long term (current) use of aspirin: Secondary | ICD-10-CM | POA: Insufficient documentation

## 2013-08-08 DIAGNOSIS — I2510 Atherosclerotic heart disease of native coronary artery without angina pectoris: Secondary | ICD-10-CM | POA: Diagnosis not present

## 2013-08-08 DIAGNOSIS — E119 Type 2 diabetes mellitus without complications: Secondary | ICD-10-CM | POA: Diagnosis not present

## 2013-08-08 DIAGNOSIS — M109 Gout, unspecified: Secondary | ICD-10-CM | POA: Insufficient documentation

## 2013-08-08 DIAGNOSIS — I480 Paroxysmal atrial fibrillation: Secondary | ICD-10-CM

## 2013-08-08 DIAGNOSIS — K2100 Gastro-esophageal reflux disease with esophagitis, without bleeding: Secondary | ICD-10-CM

## 2013-08-08 DIAGNOSIS — K219 Gastro-esophageal reflux disease without esophagitis: Secondary | ICD-10-CM | POA: Diagnosis not present

## 2013-08-08 DIAGNOSIS — I6350 Cerebral infarction due to unspecified occlusion or stenosis of unspecified cerebral artery: Secondary | ICD-10-CM

## 2013-08-08 DIAGNOSIS — E785 Hyperlipidemia, unspecified: Secondary | ICD-10-CM | POA: Insufficient documentation

## 2013-08-08 DIAGNOSIS — Z09 Encounter for follow-up examination after completed treatment for conditions other than malignant neoplasm: Secondary | ICD-10-CM

## 2013-08-08 DIAGNOSIS — Z87891 Personal history of nicotine dependence: Secondary | ICD-10-CM | POA: Insufficient documentation

## 2013-08-08 DIAGNOSIS — Z1211 Encounter for screening for malignant neoplasm of colon: Secondary | ICD-10-CM

## 2013-08-08 DIAGNOSIS — I639 Cerebral infarction, unspecified: Secondary | ICD-10-CM

## 2013-08-08 DIAGNOSIS — K209 Esophagitis, unspecified without bleeding: Secondary | ICD-10-CM | POA: Insufficient documentation

## 2013-08-08 DIAGNOSIS — E139 Other specified diabetes mellitus without complications: Secondary | ICD-10-CM

## 2013-08-08 DIAGNOSIS — I509 Heart failure, unspecified: Secondary | ICD-10-CM | POA: Diagnosis not present

## 2013-08-08 DIAGNOSIS — M129 Arthropathy, unspecified: Secondary | ICD-10-CM | POA: Insufficient documentation

## 2013-08-08 DIAGNOSIS — Z8673 Personal history of transient ischemic attack (TIA), and cerebral infarction without residual deficits: Secondary | ICD-10-CM | POA: Insufficient documentation

## 2013-08-08 DIAGNOSIS — I4891 Unspecified atrial fibrillation: Secondary | ICD-10-CM | POA: Insufficient documentation

## 2013-08-08 DIAGNOSIS — I10 Essential (primary) hypertension: Secondary | ICD-10-CM

## 2013-08-08 DIAGNOSIS — E089 Diabetes mellitus due to underlying condition without complications: Secondary | ICD-10-CM

## 2013-08-08 LAB — POCT GLYCOSYLATED HEMOGLOBIN (HGB A1C): HEMOGLOBIN A1C: 6

## 2013-08-08 LAB — GLUCOSE, POCT (MANUAL RESULT ENTRY): POC Glucose: 125 mg/dl — AB (ref 70–99)

## 2013-08-08 MED ORDER — SPIRONOLACTONE 25 MG PO TABS: 25.0000 mg | ORAL_TABLET | Freq: Every day | ORAL | Status: DC

## 2013-08-08 MED ORDER — PANTOPRAZOLE SODIUM 40 MG PO TBEC: 40.0000 mg | DELAYED_RELEASE_TABLET | Freq: Every day | ORAL | Status: DC

## 2013-08-08 MED ORDER — BACLOFEN 10 MG PO TABS
5.0000 mg | ORAL_TABLET | Freq: Three times a day (TID) | ORAL | Status: DC
Start: 2013-08-08 — End: 2014-07-25

## 2013-08-08 MED ORDER — ISOSORBIDE MONONITRATE ER 60 MG PO TB24: ORAL_TABLET | ORAL | Status: DC

## 2013-08-08 MED ORDER — RAMIPRIL 10 MG PO CAPS: ORAL_CAPSULE | ORAL | Status: DC

## 2013-08-08 MED ORDER — CARVEDILOL 25 MG PO TABS: 25.0000 mg | ORAL_TABLET | Freq: Two times a day (BID) | ORAL | Status: DC

## 2013-08-08 MED ORDER — HYDRALAZINE HCL 50 MG PO TABS: ORAL_TABLET | ORAL | Status: DC

## 2013-08-08 MED ORDER — PRAVASTATIN SODIUM 20 MG PO TABS: ORAL_TABLET | ORAL | Status: DC

## 2013-08-08 MED ORDER — METFORMIN HCL 850 MG PO TABS: 850.0000 mg | ORAL_TABLET | Freq: Two times a day (BID) | ORAL | Status: DC

## 2013-08-08 MED ORDER — ALLOPURINOL 100 MG PO TABS: 100.0000 mg | ORAL_TABLET | Freq: Every day | ORAL | Status: DC

## 2013-08-08 MED ORDER — DILTIAZEM HCL ER COATED BEADS 180 MG PO CP24: ORAL_CAPSULE | ORAL | Status: DC

## 2013-08-08 NOTE — Progress Notes (Signed)
MRN: [id number] Name: [name]  Sex: male Age: 58 y.o. DOB: 05-18-1955  Allergies: Review of patient's allergies indicates no known allergies.  Chief Complaint  Patient presents with  . Follow-up    HPI: Patient is 58 y.o. male who has history of diabetes hypertension CAD gout hyperlipidemia comes today for followup, patient denies any hypoglycemic symptoms, his diabetes is very well controlled his blood pressure is well controlled, he is requesting refill on his medication also he has history of hemorrhagic stroke , patient is requesting referral to see a physical therapist, patient also never had a colonoscopy done.  Past Medical History  Diagnosis Date  . Diabetes mellitus   . Coronary artery disease   . Hypertension   . Gout   . CHF (congestive heart failure)   . Afib   . Arthritis   . Hypertensive emergency 11/19/2010  . Pulmonary edema 11/19/2010  . Acute exacerbation of congestive heart failure 11/19/2010  . Thyroiditis 11/20/2010  . ICH (intracerebral hemorrhage) 12/22/2010  . Stroke 11/19/2010  . Respiratory failure 11/19/2010  . CAD (coronary artery disease) 11/20/2010  . Physical deconditioning 12/22/2010  . Diabetes mellitus 11/19/2010  . A-fib 11/19/2010  . Hyperlipemia 11/21/2010  . Shortness of breath     Past Surgical History  Procedure Laterality Date  . Tracheostomy tube placement  11/28/2010    Procedure: TRACHEOSTOMY;  Surgeon: Beckie Salts, MD;  Location: [place];  Service: ENT;  Laterality: N/A;  . Peg placement  12/03/2010    Procedure: PERCUTANEOUS ENDOSCOPIC GASTROSTOMY (PEG) PLACEMENT;  Surgeon: Lafayette Dragon, MD;  Location: [facility] ENDOSCOPY;  Service: Endoscopy;  Laterality: N/A;      Medication List       This list is accurate as of: 08/08/13  2:52 PM.  Always use your most recent med list.               allopurinol 100 MG tablet  Commonly known as:  ZYLOPRIM  Take 1 tablet (100 mg total) by mouth daily.     aspirin 81 MG tablet    Take 1 tablet (81 mg total) by mouth daily at 12 noon.     baclofen 10 MG tablet  Commonly known as:  LIORESAL  Take 0.5 tablets (5 mg total) by mouth 3 (three) times daily.     carvedilol 25 MG tablet  Commonly known as:  COREG  Take 1 tablet (25 mg total) by mouth 2 (two) times daily with a meal.     diltiazem 180 MG 24 hr tablet  Commonly known as:  CARDIZEM LA  Take 2 tablets (360 mg total) by mouth daily at 12 noon.     diltiazem 180 MG 24 hr capsule  Commonly known as:  CARDIZEM CD  TAKE 2 CAPSULES BY MOUTH DAILY AT NOON.     doxazosin 2 MG tablet  Commonly known as:  CARDURA  Take 1 tablet (2 mg total) by mouth at bedtime.     glucose monitoring kit monitoring kit  1 each by Does not apply route 4 (four) times daily - after meals and at bedtime. 1 month Diabetic Testing Supplies for QAC-QHS accuchecks.     hydrALAZINE 50 MG tablet  Commonly known as:  APRESOLINE  TAKE 2 AND 1/4 TABLETS TWICE A DAY     HYDROcodone-acetaminophen 5-325 MG per tablet  Commonly known as:  NORCO  Take 1 tablet by mouth every 6 (six) hours as needed.  isosorbide mononitrate 60 MG 24 hr tablet  Commonly known as:  IMDUR  TAKE 1 TABLET BY MOUTH TWICE DAILY.     isosorbide-hydrALAZINE 20-37.5 MG per tablet  Commonly known as:  BIDIL  Take 2 tablets by mouth 3 (three) times daily.     lisinopril 20 MG tablet  Commonly known as:  PRINIVIL,ZESTRIL  TAKE 1 TABLET BY MOUTH 2 TIMES DAILY.     metFORMIN 850 MG tablet  Commonly known as:  GLUCOPHAGE  Take 1 tablet (850 mg total) by mouth 2 (two) times daily with a meal.     pantoprazole 40 MG tablet  Commonly known as:  PROTONIX  Take 1 tablet (40 mg total) by mouth daily at 12 noon.     pravastatin 20 MG tablet  Commonly known as:  PRAVACHOL  TAKE 1 TABLET BY MOUTH ONCE DAILY IN THE MORNING     ramipril 10 MG capsule  Commonly known as:  ALTACE  TAKE ONE CAPSULE BY MOUTH TWICE DAILY     spironolactone 25 MG tablet  Commonly  known as:  ALDACTONE  Take 1 tablet (25 mg total) by mouth daily.     UNABLE TO FIND  - Power wheelchair seating evaluation  -   - DX. History of stroke and pain in limbs        Meds ordered this encounter  Medications  . spironolactone (ALDACTONE) 25 MG tablet    Sig: Take 1 tablet (25 mg total) by mouth daily.    Dispense:  30 tablet    Refill:  3  . allopurinol (ZYLOPRIM) 100 MG tablet    Sig: Take 1 tablet (100 mg total) by mouth daily.    Dispense:  30 tablet    Refill:  3    To prevent gout flareup  . baclofen (LIORESAL) 10 MG tablet    Sig: Take 0.5 tablets (5 mg total) by mouth 3 (three) times daily.    Dispense:  45 tablet    Refill:  1    To prevent hiccups.  In a few days try to taper to twice a day for a week. If hiccups do not recur then decrease to  once a day as tolerated.  . carvedilol (COREG) 25 MG tablet    Sig: Take 1 tablet (25 mg total) by mouth 2 (two) times daily with a meal.    Dispense:  120 tablet    Refill:  3  . diltiazem (CARDIZEM CD) 180 MG 24 hr capsule    Sig: TAKE 2 CAPSULES BY MOUTH DAILY AT NOON.    Dispense:  60 capsule    Refill:  3  . hydrALAZINE (APRESOLINE) 50 MG tablet    Sig: TAKE 2 AND 1/4 TABLETS TWICE A DAY    Dispense:  135 tablet    Refill:  3  . isosorbide mononitrate (IMDUR) 60 MG 24 hr tablet    Sig: TAKE 1 TABLET BY MOUTH TWICE DAILY.    Dispense:  60 tablet    Refill:  3  . metFORMIN (GLUCOPHAGE) 850 MG tablet    Sig: Take 1 tablet (850 mg total) by mouth 2 (two) times daily with a meal.    Dispense:  60 tablet    Refill:  3  . pantoprazole (PROTONIX) 40 MG tablet    Sig: Take 1 tablet (40 mg total) by mouth daily at 12 noon.    Dispense:  120 tablet    Refill:  3  . pravastatin (PRAVACHOL)  20 MG tablet    Sig: TAKE 1 TABLET BY MOUTH ONCE DAILY IN THE MORNING    Dispense:  30 tablet    Refill:  0  . ramipril (ALTACE) 10 MG capsule    Sig: TAKE ONE CAPSULE BY MOUTH TWICE DAILY    Dispense:  30 capsule     Refill:  3    Immunization History  Administered Date(s) Administered  . Influenza Whole 11/21/2010  . Influenza,inj,Quad PF,36+ Mos 12/07/2012  . Pneumococcal Polysaccharide-23 11/21/2010    History reviewed. No pertinent family history.  History  Substance Use Topics  . Smoking status: Former Smoker    Types: Cigarettes    Quit date: 12/19/2005  . Smokeless tobacco: Never Used  . Alcohol Use: No     Comment: quit in 2012    Review of Systems   As noted in HPI  Filed Vitals:   08/08/13 1425  BP: 110/76  Pulse: 70  Temp: 97.9 F (36.6 C)  Resp: 15    Physical Exam  Physical Exam  Constitutional: No distress.  Patient is in wheelchair  Eyes: EOM are normal. Pupils are equal, round, and reactive to light.  Cardiovascular: Normal rate.   regularly irregular    Pulmonary/Chest: No respiratory distress. He has no wheezes. He has no rales.  Musculoskeletal: He exhibits no edema.  Strength 4/5 in both lower extremities.    CBC    Component Value Date/Time   WBC 5.3 09/02/2012 1251   RBC 4.42 09/02/2012 1251   HGB 13.1 09/02/2012 1251   HCT 39.0 09/02/2012 1251   PLT 274 09/02/2012 1251   MCV 88.2 09/02/2012 1251   LYMPHSABS 2.6 09/02/2012 1251   MONOABS 0.4 09/02/2012 1251   EOSABS 0.1 09/02/2012 1251   BASOSABS 0.0 09/02/2012 1251    CMP     Component Value Date/Time   NA 139 04/25/2013 1453   NA 139 04/25/2013 1453   K 4.4 04/25/2013 1453   K 4.4 04/25/2013 1453   CL 101 04/25/2013 1453   CL 101 04/25/2013 1453   CO2 28 04/25/2013 1453   CO2 28 04/25/2013 1453   GLUCOSE 121* 04/25/2013 1453   GLUCOSE 121* 04/25/2013 1453   BUN 24* 04/25/2013 1453   BUN 24* 04/25/2013 1453   CREATININE 1.26 04/25/2013 1453   CREATININE 1.26 04/25/2013 1453   CREATININE 1.26 08/20/2012 0600   CALCIUM 10.0 04/25/2013 1453   CALCIUM 10.0 04/25/2013 1453   PROT 7.1 04/25/2013 1453   ALBUMIN 4.1 04/25/2013 1453   AST 11 04/25/2013 1453   ALT <8 04/25/2013 1453   ALKPHOS 55 04/25/2013 1453     BILITOT 0.3 04/25/2013 1453   GFRNONAA 62 04/25/2013 1453   GFRNONAA 61* 08/20/2012 0600   GFRAA 72 04/25/2013 1453   GFRAA 71* 08/20/2012 0600    Lab Results  Component Value Date/Time   CHOL 108 09/02/2012 12:51 PM    No components found with this basename: hga1c    Lab Results  Component Value Date/Time   AST 11 04/25/2013  2:53 PM    Assessment and Plan  Diabetes mellitus due to underlying condition without complications - Plan:  Results for orders placed in visit on 08/08/13  GLUCOSE, POCT (MANUAL RESULT ENTRY)      Result Value Ref Range   POC Glucose 125 (*) 70 - 99 mg/dl  POCT GLYCOSYLATED HEMOGLOBIN (HGB A1C)      Result Value Ref Range   Hemoglobin A1C 6.0  Diabetes is well controlled patient will continued current medications. carvedilol (COREG) 25 MG tablet  Essential hypertension Blood pressure is well-controlled continued current meds carvedilol (COREG) 25 MG tablet  Plan: spironolactone (ALDACTONE) 25 MG tablet, carvedilol (COREG) 25 MG tablet, metFORMIN (GLUCOPHAGE) 850 MG tablet  Paroxysmal atrial fibrillation - Plan: Patient is not on aspirin secondary to history of hemorrhagic stroke   Gastroesophageal reflux disease with esophagitis - Plan: pantoprazole (PROTONIX) 40 MG tablet  History of Stroke - Plan: Ambulatory referral to Physical Therapy   Health Maintenance -Colonoscopy: Referred to GI for screening colonoscopy.  Return in about 4 months (around 12/09/2013) for diabetes, hypertension.  Lorayne Marek, MD

## 2013-08-08 NOTE — Progress Notes (Signed)
Patient here for follow up on his diabetes and for medication refills

## 2013-08-09 ENCOUNTER — Other Ambulatory Visit: Payer: Self-pay | Admitting: Internal Medicine

## 2013-08-09 DIAGNOSIS — I10 Essential (primary) hypertension: Secondary | ICD-10-CM

## 2013-08-09 DIAGNOSIS — Z09 Encounter for follow-up examination after completed treatment for conditions other than malignant neoplasm: Secondary | ICD-10-CM

## 2013-08-09 DIAGNOSIS — M109 Gout, unspecified: Secondary | ICD-10-CM

## 2013-08-09 MED ORDER — ALLOPURINOL 100 MG PO TABS: 100.0000 mg | ORAL_TABLET | Freq: Every day | ORAL | Status: DC

## 2013-08-09 MED ORDER — METFORMIN HCL 850 MG PO TABS: 850.0000 mg | ORAL_TABLET | Freq: Two times a day (BID) | ORAL | Status: DC

## 2013-08-10 ENCOUNTER — Other Ambulatory Visit: Payer: Self-pay | Admitting: Internal Medicine

## 2013-08-11 ENCOUNTER — Encounter: Payer: Self-pay | Admitting: Internal Medicine

## 2013-08-11 ENCOUNTER — Ambulatory Visit: Payer: Medicare Other | Admitting: Rehabilitative and Restorative Service Providers"

## 2013-08-20 ENCOUNTER — Other Ambulatory Visit: Payer: Self-pay | Admitting: Internal Medicine

## 2013-08-22 ENCOUNTER — Ambulatory Visit: Payer: Medicare Other | Attending: Internal Medicine | Admitting: Physical Therapy

## 2013-08-22 DIAGNOSIS — IMO0001 Reserved for inherently not codable concepts without codable children: Secondary | ICD-10-CM | POA: Diagnosis present

## 2013-08-22 DIAGNOSIS — R269 Unspecified abnormalities of gait and mobility: Secondary | ICD-10-CM | POA: Diagnosis not present

## 2013-08-25 ENCOUNTER — Ambulatory Visit: Payer: Medicare Other | Admitting: Physical Therapy

## 2013-08-25 DIAGNOSIS — IMO0001 Reserved for inherently not codable concepts without codable children: Secondary | ICD-10-CM | POA: Diagnosis not present

## 2013-08-29 ENCOUNTER — Ambulatory Visit: Payer: Medicare Other | Admitting: Physical Therapy

## 2013-08-29 DIAGNOSIS — IMO0001 Reserved for inherently not codable concepts without codable children: Secondary | ICD-10-CM | POA: Diagnosis not present

## 2013-09-01 ENCOUNTER — Ambulatory Visit: Payer: Medicare Other | Admitting: Physical Therapy

## 2013-09-01 ENCOUNTER — Telehealth: Payer: Self-pay | Admitting: Internal Medicine

## 2013-09-01 ENCOUNTER — Telehealth: Payer: Self-pay

## 2013-09-01 DIAGNOSIS — IMO0001 Reserved for inherently not codable concepts without codable children: Secondary | ICD-10-CM | POA: Diagnosis not present

## 2013-09-01 NOTE — Telephone Encounter (Signed)
Melissa from [employer] called in reference to a form for diabetic shoes She will be faxing a form to be filled out

## 2013-09-01 NOTE — Telephone Encounter (Signed)
Representative Lenna Sciara) from [employer] calling in requesting status of documents that she says were sent in, in May for diabetic footwear. (Form # [id number]). Pls follow up with pt.

## 2013-09-05 ENCOUNTER — Ambulatory Visit: Payer: Medicare Other | Admitting: Physical Therapy

## 2013-09-07 ENCOUNTER — Ambulatory Visit: Payer: Medicare Other | Admitting: Physical Therapy

## 2013-09-07 ENCOUNTER — Other Ambulatory Visit: Payer: Self-pay | Admitting: Internal Medicine

## 2013-09-07 DIAGNOSIS — IMO0001 Reserved for inherently not codable concepts without codable children: Secondary | ICD-10-CM | POA: Diagnosis not present

## 2013-09-07 NOTE — Telephone Encounter (Signed)
Spoke with Melissa. She will fax over completed form to have filled out.

## 2013-09-07 NOTE — Telephone Encounter (Signed)
Melissa from [employer] called to check on the status for the diabetic shoes for the pt. Please f/u with her at [phone].

## 2013-09-08 ENCOUNTER — Other Ambulatory Visit: Payer: Self-pay | Admitting: Internal Medicine

## 2013-09-12 ENCOUNTER — Ambulatory Visit: Payer: Medicare Other | Attending: Internal Medicine | Admitting: Physical Therapy

## 2013-09-12 DIAGNOSIS — R269 Unspecified abnormalities of gait and mobility: Secondary | ICD-10-CM | POA: Insufficient documentation

## 2013-09-12 DIAGNOSIS — IMO0001 Reserved for inherently not codable concepts without codable children: Secondary | ICD-10-CM | POA: Insufficient documentation

## 2013-09-14 ENCOUNTER — Ambulatory Visit: Payer: Medicare Other | Admitting: Physical Therapy

## 2013-09-17 ENCOUNTER — Other Ambulatory Visit: Payer: Self-pay | Admitting: Internal Medicine

## 2013-09-19 ENCOUNTER — Ambulatory Visit: Payer: Medicare Other | Admitting: Physical Therapy

## 2013-09-19 DIAGNOSIS — IMO0001 Reserved for inherently not codable concepts without codable children: Secondary | ICD-10-CM | POA: Diagnosis not present

## 2013-09-21 ENCOUNTER — Ambulatory Visit: Payer: Medicare Other | Admitting: Physical Therapy

## 2013-09-21 DIAGNOSIS — IMO0001 Reserved for inherently not codable concepts without codable children: Secondary | ICD-10-CM | POA: Diagnosis not present

## 2013-09-25 ENCOUNTER — Other Ambulatory Visit: Payer: Self-pay

## 2013-09-25 ENCOUNTER — Telehealth: Payer: Self-pay | Admitting: Internal Medicine

## 2013-09-25 DIAGNOSIS — Z760 Encounter for issue of repeat prescription: Secondary | ICD-10-CM

## 2013-09-25 DIAGNOSIS — Z09 Encounter for follow-up examination after completed treatment for conditions other than malignant neoplasm: Secondary | ICD-10-CM

## 2013-09-25 MED ORDER — DOXAZOSIN MESYLATE 2 MG PO TABS: 2.0000 mg | ORAL_TABLET | Freq: Every day | ORAL | Status: DC

## 2013-09-25 NOTE — Telephone Encounter (Signed)
Patient needs medication refill for doxazosin; please sen it to [facility] at [address]. Please follow up with Patient.

## 2013-09-26 ENCOUNTER — Ambulatory Visit: Payer: Medicare Other | Admitting: Physical Therapy

## 2013-09-26 DIAGNOSIS — IMO0001 Reserved for inherently not codable concepts without codable children: Secondary | ICD-10-CM | POA: Diagnosis not present

## 2013-09-28 ENCOUNTER — Ambulatory Visit: Payer: Medicare Other | Admitting: Physical Therapy

## 2013-09-29 ENCOUNTER — Ambulatory Visit: Payer: Medicare Other | Admitting: Physical Therapy

## 2013-09-29 DIAGNOSIS — IMO0001 Reserved for inherently not codable concepts without codable children: Secondary | ICD-10-CM | POA: Diagnosis not present

## 2013-10-02 ENCOUNTER — Ambulatory Visit: Payer: Medicare Other | Admitting: Physical Therapy

## 2013-10-02 DIAGNOSIS — IMO0001 Reserved for inherently not codable concepts without codable children: Secondary | ICD-10-CM | POA: Diagnosis not present

## 2013-10-05 ENCOUNTER — Ambulatory Visit: Payer: Medicare Other | Admitting: Physical Therapy

## 2013-10-05 DIAGNOSIS — IMO0001 Reserved for inherently not codable concepts without codable children: Secondary | ICD-10-CM | POA: Diagnosis not present

## 2013-10-09 ENCOUNTER — Other Ambulatory Visit: Payer: Self-pay | Admitting: Internal Medicine

## 2013-10-10 ENCOUNTER — Ambulatory Visit: Payer: Medicare Other | Admitting: Physical Therapy

## 2013-10-10 DIAGNOSIS — IMO0001 Reserved for inherently not codable concepts without codable children: Secondary | ICD-10-CM | POA: Diagnosis not present

## 2013-10-12 ENCOUNTER — Ambulatory Visit: Payer: Medicare Other | Attending: Internal Medicine | Admitting: Physical Therapy

## 2013-10-12 DIAGNOSIS — I69393 Ataxia following cerebral infarction: Secondary | ICD-10-CM | POA: Diagnosis not present

## 2013-10-12 DIAGNOSIS — R531 Weakness: Secondary | ICD-10-CM | POA: Diagnosis not present

## 2013-10-12 DIAGNOSIS — R269 Unspecified abnormalities of gait and mobility: Secondary | ICD-10-CM | POA: Insufficient documentation

## 2013-10-12 DIAGNOSIS — I69398 Other sequelae of cerebral infarction: Secondary | ICD-10-CM | POA: Insufficient documentation

## 2013-10-17 ENCOUNTER — Encounter: Payer: Medicare Other | Admitting: Internal Medicine

## 2013-10-17 ENCOUNTER — Other Ambulatory Visit: Payer: Self-pay | Admitting: Internal Medicine

## 2013-10-18 ENCOUNTER — Ambulatory Visit: Payer: Medicare Other | Admitting: Physical Therapy

## 2013-10-18 DIAGNOSIS — I69393 Ataxia following cerebral infarction: Secondary | ICD-10-CM | POA: Diagnosis not present

## 2013-10-20 ENCOUNTER — Ambulatory Visit: Payer: Medicare Other | Admitting: Physical Therapy

## 2013-10-20 DIAGNOSIS — I69393 Ataxia following cerebral infarction: Secondary | ICD-10-CM | POA: Diagnosis not present

## 2013-10-23 ENCOUNTER — Ambulatory Visit: Payer: Medicare Other | Admitting: Physical Therapy

## 2013-10-23 ENCOUNTER — Telehealth: Payer: Self-pay | Admitting: Internal Medicine

## 2013-10-23 ENCOUNTER — Encounter: Payer: Self-pay | Admitting: Internal Medicine

## 2013-10-23 ENCOUNTER — Other Ambulatory Visit: Payer: Self-pay | Admitting: Internal Medicine

## 2013-10-23 DIAGNOSIS — I69393 Ataxia following cerebral infarction: Secondary | ICD-10-CM | POA: Diagnosis not present

## 2013-10-23 NOTE — Telephone Encounter (Signed)
Message copied by Oliva Bustard on Mon Oct 23, 2013  9:36 AM ------      Message from: Lowry Ram      Created: Fri Sep 29, 2013 12:50 PM      Regarding: need for OV with Awanda Mink:  Pt needs OV scheduled with Olevia Perches when December calendar is open.  Pt wants screening colonoscopy and is wheelchair bound.  Pt knows you will call when calendar available.  Thanks, Humphrey Rolls saw pt in hospital 2012) ------

## 2013-10-24 ENCOUNTER — Telehealth: Payer: Self-pay | Admitting: Internal Medicine

## 2013-10-24 NOTE — Telephone Encounter (Signed)
Patient is calling in today to request a referral for home care, patient is not mobile. Please f/u with patient about this request and if he needs to come in for an appointment;

## 2013-10-26 ENCOUNTER — Ambulatory Visit: Payer: Medicare Other | Admitting: Physical Therapy

## 2013-10-26 ENCOUNTER — Telehealth: Payer: Self-pay | Admitting: Emergency Medicine

## 2013-10-26 DIAGNOSIS — I69393 Ataxia following cerebral infarction: Secondary | ICD-10-CM | POA: Diagnosis not present

## 2013-10-26 NOTE — Telephone Encounter (Signed)
Patient is calling in today to request a referral for home care, patient is not mobile. Please f/u with patient about this request and if he needs to come in for an appointment;     Please f/u

## 2013-10-27 ENCOUNTER — Encounter: Payer: Self-pay | Admitting: *Deleted

## 2013-10-27 ENCOUNTER — Other Ambulatory Visit: Payer: Self-pay

## 2013-10-31 ENCOUNTER — Ambulatory Visit: Payer: Medicare Other | Admitting: Physical Therapy

## 2013-10-31 DIAGNOSIS — I69393 Ataxia following cerebral infarction: Secondary | ICD-10-CM | POA: Diagnosis not present

## 2013-11-02 ENCOUNTER — Ambulatory Visit: Payer: Medicare Other | Admitting: Physical Therapy

## 2013-11-02 DIAGNOSIS — I69393 Ataxia following cerebral infarction: Secondary | ICD-10-CM | POA: Diagnosis not present

## 2013-11-07 ENCOUNTER — Ambulatory Visit: Payer: Medicare Other | Admitting: Physical Therapy

## 2013-11-07 ENCOUNTER — Other Ambulatory Visit: Payer: Self-pay | Admitting: Internal Medicine

## 2013-11-07 DIAGNOSIS — I69393 Ataxia following cerebral infarction: Secondary | ICD-10-CM | POA: Diagnosis not present

## 2013-11-09 ENCOUNTER — Telehealth: Payer: Self-pay

## 2013-11-09 ENCOUNTER — Ambulatory Visit: Payer: Medicare Other | Admitting: Physical Therapy

## 2013-11-09 ENCOUNTER — Other Ambulatory Visit: Payer: Self-pay

## 2013-11-09 DIAGNOSIS — I69393 Ataxia following cerebral infarction: Secondary | ICD-10-CM | POA: Diagnosis not present

## 2013-11-09 DIAGNOSIS — I639 Cerebral infarction, unspecified: Secondary | ICD-10-CM

## 2013-11-09 NOTE — Telephone Encounter (Signed)
Spoke with patient this afternoon He is aware referral for home care was placed in epic

## 2013-11-13 ENCOUNTER — Telehealth: Payer: Self-pay | Admitting: Internal Medicine

## 2013-11-14 ENCOUNTER — Other Ambulatory Visit: Payer: Self-pay

## 2013-11-14 ENCOUNTER — Ambulatory Visit: Payer: Medicare Other | Admitting: Physical Therapy

## 2013-11-15 ENCOUNTER — Telehealth: Payer: Self-pay | Admitting: Internal Medicine

## 2013-11-15 NOTE — Telephone Encounter (Signed)
Patient has called in to ask about doxazosin (CARDURA) 2 MG tablet; please f/u with this patient

## 2013-11-15 NOTE — Telephone Encounter (Signed)
Patient would also like to have a refill on lisinopril (PRINIVIL,ZESTRIL) 20 MG tablet

## 2013-11-16 ENCOUNTER — Other Ambulatory Visit: Payer: Self-pay

## 2013-11-16 MED ORDER — LISINOPRIL 20 MG PO TABS: ORAL_TABLET | ORAL | Status: DC

## 2013-11-17 ENCOUNTER — Ambulatory Visit: Payer: Medicare Other | Admitting: Physical Therapy

## 2013-11-21 ENCOUNTER — Ambulatory Visit: Payer: Medicare Other | Admitting: Physical Therapy

## 2013-11-23 ENCOUNTER — Ambulatory Visit: Payer: Medicare Other | Admitting: Physical Therapy

## 2013-11-24 ENCOUNTER — Telehealth: Payer: Self-pay | Admitting: *Deleted

## 2013-11-24 NOTE — Telephone Encounter (Signed)
LCSW contacted patient in order to confirm that can make an appointment on Monday Nov 16th at 3:45pm. Patient stated that he should be able to make that time and will call if he cannot make it.  Christene Lye MSW, LCSW

## 2013-11-27 ENCOUNTER — Encounter: Payer: Self-pay | Admitting: Internal Medicine

## 2013-11-27 ENCOUNTER — Ambulatory Visit: Payer: Medicare HMO | Attending: Internal Medicine | Admitting: Internal Medicine

## 2013-11-27 ENCOUNTER — Ambulatory Visit (HOSPITAL_BASED_OUTPATIENT_CLINIC_OR_DEPARTMENT_OTHER): Payer: Medicare HMO

## 2013-11-27 VITALS — BP 134/91 | HR 76 | Temp 97.7°F | Resp 15

## 2013-11-27 DIAGNOSIS — I2510 Atherosclerotic heart disease of native coronary artery without angina pectoris: Secondary | ICD-10-CM

## 2013-11-27 DIAGNOSIS — K2100 Gastro-esophageal reflux disease with esophagitis, without bleeding: Secondary | ICD-10-CM

## 2013-11-27 DIAGNOSIS — M109 Gout, unspecified: Secondary | ICD-10-CM | POA: Insufficient documentation

## 2013-11-27 DIAGNOSIS — Z8673 Personal history of transient ischemic attack (TIA), and cerebral infarction without residual deficits: Secondary | ICD-10-CM | POA: Insufficient documentation

## 2013-11-27 DIAGNOSIS — I10 Essential (primary) hypertension: Secondary | ICD-10-CM

## 2013-11-27 DIAGNOSIS — Z79899 Other long term (current) drug therapy: Secondary | ICD-10-CM | POA: Insufficient documentation

## 2013-11-27 DIAGNOSIS — E785 Hyperlipidemia, unspecified: Secondary | ICD-10-CM | POA: Insufficient documentation

## 2013-11-27 DIAGNOSIS — Z7982 Long term (current) use of aspirin: Secondary | ICD-10-CM | POA: Insufficient documentation

## 2013-11-27 DIAGNOSIS — I480 Paroxysmal atrial fibrillation: Secondary | ICD-10-CM

## 2013-11-27 DIAGNOSIS — I639 Cerebral infarction, unspecified: Secondary | ICD-10-CM

## 2013-11-27 DIAGNOSIS — Z23 Encounter for immunization: Secondary | ICD-10-CM | POA: Diagnosis not present

## 2013-11-27 DIAGNOSIS — E119 Type 2 diabetes mellitus without complications: Secondary | ICD-10-CM | POA: Diagnosis present

## 2013-11-27 DIAGNOSIS — Z09 Encounter for follow-up examination after completed treatment for conditions other than malignant neoplasm: Secondary | ICD-10-CM

## 2013-11-27 DIAGNOSIS — Z760 Encounter for issue of repeat prescription: Secondary | ICD-10-CM

## 2013-11-27 DIAGNOSIS — Z930 Tracheostomy status: Secondary | ICD-10-CM | POA: Insufficient documentation

## 2013-11-27 DIAGNOSIS — I509 Heart failure, unspecified: Secondary | ICD-10-CM | POA: Insufficient documentation

## 2013-11-27 DIAGNOSIS — E139 Other specified diabetes mellitus without complications: Secondary | ICD-10-CM

## 2013-11-27 LAB — GLUCOSE, POCT (MANUAL RESULT ENTRY): POC Glucose: 137 mg/dl — AB (ref 70–99)

## 2013-11-27 MED ORDER — ISOSORBIDE MONONITRATE ER 60 MG PO TB24: ORAL_TABLET | ORAL | Status: DC

## 2013-11-27 MED ORDER — SPIRONOLACTONE 25 MG PO TABS: 25.0000 mg | ORAL_TABLET | Freq: Every day | ORAL | Status: DC

## 2013-11-27 MED ORDER — PANTOPRAZOLE SODIUM 40 MG PO TBEC: 40.0000 mg | DELAYED_RELEASE_TABLET | Freq: Every day | ORAL | Status: DC

## 2013-11-27 MED ORDER — METFORMIN HCL 850 MG PO TABS: 850.0000 mg | ORAL_TABLET | Freq: Two times a day (BID) | ORAL | Status: DC

## 2013-11-27 MED ORDER — DOXAZOSIN MESYLATE 2 MG PO TABS: 2.0000 mg | ORAL_TABLET | Freq: Every day | ORAL | Status: DC

## 2013-11-27 MED ORDER — HYDRALAZINE HCL 50 MG PO TABS: ORAL_TABLET | ORAL | Status: DC

## 2013-11-27 MED ORDER — DILTIAZEM HCL ER COATED BEADS 180 MG PO CP24: ORAL_CAPSULE | ORAL | Status: DC

## 2013-11-27 MED ORDER — CARVEDILOL 25 MG PO TABS: 25.0000 mg | ORAL_TABLET | Freq: Two times a day (BID) | ORAL | Status: DC

## 2013-11-27 MED ORDER — LISINOPRIL 20 MG PO TABS: ORAL_TABLET | ORAL | Status: DC

## 2013-11-27 NOTE — Progress Notes (Signed)
MRN: [id number] Name: [name]  Sex: male Age: 58 y.o. DOB: October 27, 1955  Allergies: Review of patient's allergies indicates no known allergies.  Chief Complaint  Patient presents with  . Follow-up    HPI: Patient is 58 y.o. male who has history of hypertension CAD hyperlipidemia, history of stroke in the past comes today for followup, he denies any hypoglycemic symptoms his blood pressure is borderline elevated, is requesting refill on his medications patient was following up with neuro rehabilitation, requesting referral for home health, her social worker is going to help him with his needs. Currently patient is using a walker and has improvement compared to last visit.  Past Medical History  Diagnosis Date  . Diabetes mellitus   . Coronary artery disease   . Hypertension   . Gout   . CHF (congestive heart failure)   . Arthritis   . Hypertensive emergency 11/19/2010  . Pulmonary edema 11/19/2010  . Acute exacerbation of congestive heart failure 11/19/2010  . Thyroiditis 11/20/2010  . ICH (intracerebral hemorrhage) 12/22/2010  . Stroke 11/19/2010  . Respiratory failure 11/19/2010  . CAD (coronary artery disease) 11/20/2010  . Physical deconditioning 12/22/2010  . Diabetes mellitus 11/19/2010  . A-fib 11/19/2010  . Hyperlipemia 11/21/2010  . Shortness of breath     Past Surgical History  Procedure Laterality Date  . Tracheostomy tube placement  11/28/2010    Procedure: TRACHEOSTOMY;  Surgeon: Beckie Salts, MD;  [place] [place];  Service: ENT;  Laterality: N/A;  . Peg placement  12/03/2010    Procedure: PERCUTANEOUS ENDOSCOPIC GASTROSTOMY (PEG) PLACEMENT;  Surgeon: Lafayette Dragon, MD;  Location: [facility] ENDOSCOPY;  Service: Endoscopy;  Laterality: N/A;      Medication List       This list is accurate as of: 11/27/13  4:44 PM.  Always use your most recent med list.               allopurinol 100 MG tablet  Commonly known as:  ZYLOPRIM  Take 1 tablet (100 mg total) by  mouth daily.     aspirin 81 MG tablet  Take 1 tablet (81 mg total) by mouth daily at 12 noon.     baclofen 10 MG tablet  Commonly known as:  LIORESAL  Take 0.5 tablets (5 mg total) by mouth 3 (three) times daily.     carvedilol 25 MG tablet  Commonly known as:  COREG  Take 1 tablet (25 mg total) by mouth 2 (two) times daily with a meal.     diltiazem 180 MG 24 hr capsule  Commonly known as:  CARDIZEM CD  TAKE 2 CAPSULES BY MOUTH DAILY AT NOON.     doxazosin 2 MG tablet  Commonly known as:  CARDURA  Take 1 tablet (2 mg total) by mouth at bedtime.     glucose monitoring kit monitoring kit  1 each by Does not apply route 4 (four) times daily - after meals and at bedtime. 1 month Diabetic Testing Supplies for QAC-QHS accuchecks.     hydrALAZINE 50 MG tablet  Commonly known as:  APRESOLINE  TAKE 2 AND 1/4 TABLETS TWICE A DAY     HYDROcodone-acetaminophen 5-325 MG per tablet  Commonly known as:  NORCO  Take 1 tablet by mouth every 6 (six) hours as needed.     isosorbide mononitrate 60 MG 24 hr tablet  Commonly known as:  IMDUR  TAKE 1 TABLET BY MOUTH TWICE DAILY.     lisinopril  20 MG tablet  Commonly known as:  PRINIVIL,ZESTRIL  TAKE 1 TABLET BY MOUTH TWICE DAILY     metFORMIN 850 MG tablet  Commonly known as:  GLUCOPHAGE  Take 1 tablet (850 mg total) by mouth 2 (two) times daily with a meal.     pantoprazole 40 MG tablet  Commonly known as:  PROTONIX  Take 1 tablet (40 mg total) by mouth daily at 12 noon.     pravastatin 20 MG tablet  Commonly known as:  PRAVACHOL  TAKE 1 TABLET BY MOUTH EVERY DAY IN THE MORNING     spironolactone 25 MG tablet  Commonly known as:  ALDACTONE  Take 1 tablet (25 mg total) by mouth daily.     UNABLE TO FIND  - Power wheelchair seating evaluation  -   - DX. History of stroke and pain in limbs        Meds ordered this encounter  Medications  . doxazosin (CARDURA) 2 MG tablet    Sig: Take 1 tablet (2 mg total) by mouth at  bedtime.    Dispense:  30 tablet    Refill:  3  . diltiazem (CARDIZEM CD) 180 MG 24 hr capsule    Sig: TAKE 2 CAPSULES BY MOUTH DAILY AT NOON.    Dispense:  60 capsule    Refill:  3  . carvedilol (COREG) 25 MG tablet    Sig: Take 1 tablet (25 mg total) by mouth 2 (two) times daily with a meal.    Dispense:  120 tablet    Refill:  3  . isosorbide mononitrate (IMDUR) 60 MG 24 hr tablet    Sig: TAKE 1 TABLET BY MOUTH TWICE DAILY.    Dispense:  60 tablet    Refill:  3  . hydrALAZINE (APRESOLINE) 50 MG tablet    Sig: TAKE 2 AND 1/4 TABLETS TWICE A DAY    Dispense:  135 tablet    Refill:  3  . pantoprazole (PROTONIX) 40 MG tablet    Sig: Take 1 tablet (40 mg total) by mouth daily at 12 noon.    Dispense:  120 tablet    Refill:  3  . metFORMIN (GLUCOPHAGE) 850 MG tablet    Sig: Take 1 tablet (850 mg total) by mouth 2 (two) times daily with a meal.    Dispense:  60 tablet    Refill:  3  . spironolactone (ALDACTONE) 25 MG tablet    Sig: Take 1 tablet (25 mg total) by mouth daily.    Dispense:  30 tablet    Refill:  3  . lisinopril (PRINIVIL,ZESTRIL) 20 MG tablet    Sig: TAKE 1 TABLET BY MOUTH TWICE DAILY    Dispense:  60 tablet    Refill:  3    Immunization History  Administered Date(s) Administered  . Influenza Whole 11/21/2010  . Influenza,inj,Quad PF,36+ Mos 12/07/2012, 11/27/2013  . Pneumococcal Polysaccharide-23 11/21/2010    History reviewed. No pertinent family history.  History  Substance Use Topics  . Smoking status: Former Smoker    Types: Cigarettes    Quit date: 12/19/2005  . Smokeless tobacco: Never Used  . Alcohol Use: No     Comment: quit in 2012    Review of Systems   As noted in HPI  Filed Vitals:   11/27/13 1559  BP: 134/91  Pulse: 76  Temp: 97.7 F (36.5 C)  Resp: 15    Physical Exam  Physical Exam  Constitutional: No distress.  Eyes: EOM are normal. Pupils are equal, round, and reactive to light.  Cardiovascular: Normal rate and  regular rhythm.   Pulmonary/Chest: Breath sounds normal. No respiratory distress. He has no wheezes. He has no rales.  Musculoskeletal: He exhibits no edema.  Strength 4/5 in both lower extremities.     CBC    Component Value Date/Time   WBC 5.3 09/02/2012 1251   RBC 4.42 09/02/2012 1251   HGB 13.1 09/02/2012 1251   HCT 39.0 09/02/2012 1251   PLT 274 09/02/2012 1251   MCV 88.2 09/02/2012 1251   LYMPHSABS 2.6 09/02/2012 1251   MONOABS 0.4 09/02/2012 1251   EOSABS 0.1 09/02/2012 1251   BASOSABS 0.0 09/02/2012 1251    CMP     Component Value Date/Time   NA 139 04/25/2013 1453   NA 139 04/25/2013 1453   K 4.4 04/25/2013 1453   K 4.4 04/25/2013 1453   CL 101 04/25/2013 1453   CL 101 04/25/2013 1453   CO2 28 04/25/2013 1453   CO2 28 04/25/2013 1453   GLUCOSE 121* 04/25/2013 1453   GLUCOSE 121* 04/25/2013 1453   BUN 24* 04/25/2013 1453   BUN 24* 04/25/2013 1453   CREATININE 1.26 04/25/2013 1453   CREATININE 1.26 04/25/2013 1453   CREATININE 1.26 08/20/2012 0600   CALCIUM 10.0 04/25/2013 1453   CALCIUM 10.0 04/25/2013 1453   PROT 7.1 04/25/2013 1453   ALBUMIN 4.1 04/25/2013 1453   AST 11 04/25/2013 1453   ALT <8 04/25/2013 1453   ALKPHOS 55 04/25/2013 1453   BILITOT 0.3 04/25/2013 1453   GFRNONAA 62 04/25/2013 1453   GFRNONAA 61* 08/20/2012 0600   GFRAA 72 04/25/2013 1453   GFRAA 71* 08/20/2012 0600    Lab Results  Component Value Date/Time   CHOL 108 09/02/2012 12:51 PM    No components found for: HGA1C  Lab Results  Component Value Date/Time   AST 11 04/25/2013 02:53 PM    Assessment and Plan  Other specified diabetes mellitus without complications - Plan:  Results for orders placed or performed in visit on 11/27/13  Glucose (CBG)  Result Value Ref Range   POC Glucose 137 (A) 70 - 99 mg/dl   Glucose (CBG), metFORMIN (GLUCOPHAGE) 850 MG tablet, COMPLETE METABOLIC PANEL WITH GFR, Hemoglobin A1c  Gastroesophageal reflux disease with esophagitis -  Plan:continue with pantoprazole (PROTONIX) 40 MG tablet  Stroke Patient was following up with neuro rehabilitation  Medication refill - Plan: doxazosin (CARDURA) 2 MG tablet   Paroxysmal atrial fibrillation - Plan: diltiazem (CARDIZEM CD) 180 MG 24 hr capsule, isosorbide mononitrate (IMDUR) 60 MG 24 hr tablet  Essential hypertension - Plan: spironolactone (ALDACTONE) 25 MG tablet, will check blood chemistry.   Health Maintenance Flu shot given today   Return in about 3 months (around 02/27/2014) for diabetes.  Lorayne Marek, MD

## 2013-11-27 NOTE — Progress Notes (Signed)
LCSW met with patient in order to follow up on [facility] referral. Patient stated that he needs home care services and not RN or PT. LCSW will contact referral agency and identify what services they are able to provide based on his health insurance. LCSW will follow.  Christene Lye MSW, LCSW

## 2013-11-27 NOTE — Progress Notes (Signed)
Patient states here for follow up and flu vaccine Needs referral for continuing  Physical therapy

## 2013-11-28 LAB — COMPLETE METABOLIC PANEL WITH GFR
ALBUMIN: 4.4 g/dL (ref 3.5–5.2)
ALT: <8 U/L (ref 0–53)
AST: 11 U/L (ref 0–37)
Alkaline Phosphatase: 53 U/L (ref 39–117)
BILIRUBIN TOTAL: 0.3 mg/dL (ref 0.2–1.2)
BUN: 26 mg/dL — ABNORMAL HIGH (ref 6–23)
CALCIUM: 10.2 mg/dL (ref 8.4–10.5)
CHLORIDE: 101 meq/L (ref 96–112)
CO2: 27 meq/L (ref 19–32)
Creat: 1.31 mg/dL (ref 0.50–1.35)
GFR, EST AFRICAN AMERICAN: 68 mL/min
GFR, Est Non African American: 59 mL/min — ABNORMAL LOW
Glucose, Bld: 128 mg/dL — ABNORMAL HIGH (ref 70–99)
Potassium: 5.1 mEq/L (ref 3.5–5.3)
SODIUM: 137 meq/L (ref 135–145)
TOTAL PROTEIN: 7.5 g/dL (ref 6.0–8.3)

## 2013-11-28 LAB — HEMOGLOBIN A1C
Hgb A1c MFr Bld: 6.1 % — ABNORMAL HIGH (ref <5.7)
Mean Plasma Glucose: 128 mg/dL — ABNORMAL HIGH (ref <117)

## 2013-11-29 ENCOUNTER — Other Ambulatory Visit: Payer: Self-pay | Admitting: Internal Medicine

## 2013-12-13 ENCOUNTER — Telehealth: Payer: Self-pay | Admitting: Internal Medicine

## 2013-12-13 NOTE — Telephone Encounter (Signed)
Speech therapist calling to extend orders to pt's therapy. Please f/u with therapist.

## 2013-12-19 ENCOUNTER — Telehealth: Payer: Self-pay | Admitting: Internal Medicine

## 2013-12-19 ENCOUNTER — Other Ambulatory Visit: Payer: Self-pay | Admitting: Internal Medicine

## 2013-12-19 NOTE — Telephone Encounter (Signed)
Speech therapist is calling to request a verbal order to add speech therapy to his treatment. Please f/u for any questions.

## 2013-12-29 ENCOUNTER — Encounter: Payer: Self-pay | Admitting: Internal Medicine

## 2013-12-29 ENCOUNTER — Ambulatory Visit (INDEPENDENT_AMBULATORY_CARE_PROVIDER_SITE_OTHER): Payer: Medicare HMO | Admitting: Internal Medicine

## 2013-12-29 VITALS — BP 100/64 | HR 76 | Ht 71.0 in | Wt 199.1 lb

## 2013-12-29 DIAGNOSIS — I6300 Cerebral infarction due to thrombosis of unspecified precerebral artery: Secondary | ICD-10-CM

## 2013-12-29 DIAGNOSIS — Z1211 Encounter for screening for malignant neoplasm of colon: Secondary | ICD-10-CM

## 2013-12-29 MED ORDER — MOVIPREP 100 G PO SOLR: 1.0000 | Freq: Once | ORAL | Status: DC

## 2013-12-29 NOTE — Progress Notes (Signed)
[name] 05-Jun-1955 [id number]  Note: This dictation was prepared with Dragon digital system. Any transcriptional errors that result from this procedure are unintentional.   History of Present Illness:  This is a 58 year old African-American male who is here to discuss having a screening colonoscopy. He has never had a colonoscopy. He denies any specific  lower GI, Sx's  except for occasional diarrhea. There is no family history of colon cancer. I met the patient in November 2012 when he he had a cerebrovascular accident and intracranial hemorrhage with severe neurological deficits. We placed a percutaneous gastrostomy which was later removed. He is currently living independently and has recovered to where he can live at home and walk without assistance.    Past Medical History  Diagnosis Date  . Diabetes mellitus   . Coronary artery disease   . Hypertension   . Gout   . CHF (congestive heart failure)   . Arthritis   . Hypertensive emergency 11/19/2010  . Pulmonary edema 11/19/2010  . Acute exacerbation of congestive heart failure 11/19/2010  . Thyroiditis 11/20/2010  . ICH (intracerebral hemorrhage) 12/22/2010  . Stroke 11/19/2010  . Respiratory failure 11/19/2010  . CAD (coronary artery disease) 11/20/2010  . Physical deconditioning 12/22/2010  . Diabetes mellitus 11/19/2010  . A-fib 11/19/2010  . Hyperlipemia 11/21/2010  . Shortness of breath     Past Surgical History  Procedure Laterality Date  . Tracheostomy tube placement  11/28/2010    Procedure: TRACHEOSTOMY;  Surgeon: Beckie Salts, MD;  Location: [place];  Service: ENT;  Laterality: N/A;  . Peg placement  12/03/2010    Procedure: PERCUTANEOUS ENDOSCOPIC GASTROSTOMY (PEG) PLACEMENT;  Surgeon: Lafayette Dragon, MD;  Location: [facility] ENDOSCOPY;  Service: Endoscopy;  Laterality: N/A;    No Known Allergies  Family history and social history have been reviewed.  Review of Systems:   The remainder of the 10 point ROS is negative  except as outlined in the H&P  Physical Exam: General Appearance Well developed, in no distress, comes in a wheelchair but can get up and place himself on examining table Eyes  Non icteric  HEENT  Non traumatic, normocephalic  Mouth No lesion, tongue papillated, no cheilosis Neck Supple without adenopathy, thyroid not enlarged, no carotid bruits, no JVD Lungs Clear to auscultation bilaterally COR Normal S1, normal S2, regular rhythm, no murmur, quiet precordium Abdomen soft nontender with the increased tympany. Normoactive bowel sounds. Liver edge at costal margin Rectal not done Extremities  No pedal edema Skin No lesions Neurological Alert and oriented x 3, describes paresthesia and decrease a sensations in the left part of the body Psychological Normal mood and affect  Assessment and Plan:   Problem #47 58 year old African-American male who is an appropriate  candidate for a screening colonoscopy. He is medically stable to undergo the procedure. He will continue aspirin. We have discussed prep as well as  moderate sedation with propofol. He agrees with the plan. He denies any dysphagia or any difficulties in handling liquids.    Delfin Edis 12/29/2013

## 2013-12-29 NOTE — Patient Instructions (Addendum)
You have been scheduled for a colonoscopy. Please follow written instructions given to you at your visit today.  Please pick up your prep kit at the pharmacy within the next 1-3 days. If you use inhalers (even only as needed), please bring them with you on the day of your procedure. Your physician has requested that you go to www.startemmi.com and enter the access code given to you at your visit today. This web site gives a general overview about your procedure. However, you should still follow specific instructions given to you by our office regarding your preparation for the procedure.  CC: Dr Lorayne Marek

## 2014-01-08 ENCOUNTER — Ambulatory Visit (INDEPENDENT_AMBULATORY_CARE_PROVIDER_SITE_OTHER): Payer: Medicare HMO | Admitting: Podiatry

## 2014-01-08 ENCOUNTER — Encounter: Payer: Self-pay | Admitting: Podiatry

## 2014-01-08 DIAGNOSIS — M79673 Pain in unspecified foot: Secondary | ICD-10-CM

## 2014-01-08 DIAGNOSIS — B351 Tinea unguium: Secondary | ICD-10-CM

## 2014-01-08 NOTE — Patient Instructions (Signed)
Diabetes and Foot Care Diabetes may cause you to have problems because of poor blood supply (circulation) to your feet and legs. This may cause the skin on your feet to become thinner, break easier, and heal more slowly. Your skin may become dry, and the skin may peel and crack. You may also have nerve damage in your legs and feet causing decreased feeling in them. You may not notice minor injuries to your feet that could lead to infections or more serious problems. Taking care of your feet is one of the most important things you can do for yourself.  HOME CARE INSTRUCTIONS  Wear shoes at all times, even in the house. Do not go barefoot. Bare feet are easily injured.  Check your feet daily for blisters, cuts, and redness. If you cannot see the bottom of your feet, use a mirror or ask someone for help.  Wash your feet with warm water (do not use hot water) and mild soap. Then pat your feet and the areas between your toes until they are completely dry. Do not soak your feet as this can dry your skin.  Apply a moisturizing lotion or petroleum jelly (that does not contain alcohol and is unscented) to the skin on your feet and to dry, brittle toenails. Do not apply lotion between your toes.  Trim your toenails straight across. Do not dig under them or around the cuticle. File the edges of your nails with an emery board or nail file.  Do not cut corns or calluses or try to remove them with medicine.  Wear clean socks or stockings every day. Make sure they are not too tight. Do not wear knee-high stockings since they may decrease blood flow to your legs.  Wear shoes that fit properly and have enough cushioning. To break in new shoes, wear them for just a few hours a day. This prevents you from injuring your feet. Always look in your shoes before you put them on to be sure there are no objects inside.  Do not cross your legs. This may decrease the blood flow to your feet.  If you find a minor scrape,  cut, or break in the skin on your feet, keep it and the skin around it clean and dry. These areas may be cleansed with mild soap and water. Do not cleanse the area with peroxide, alcohol, or iodine.  When you remove an adhesive bandage, be sure not to damage the skin around it.  If you have a wound, look at it several times a day to make sure it is healing.  Do not use heating pads or hot water bottles. They may burn your skin. If you have lost feeling in your feet or legs, you may not know it is happening until it is too late.  Make sure your health care provider performs a complete foot exam at least annually or more often if you have foot problems. Report any cuts, sores, or bruises to your health care provider immediately. SEEK MEDICAL CARE IF:   You have an injury that is not healing.  You have cuts or breaks in the skin.  You have an ingrown nail.  You notice redness on your legs or feet.  You feel burning or tingling in your legs or feet.  You have pain or cramps in your legs and feet.  Your legs or feet are numb.  Your feet always feel cold. SEEK IMMEDIATE MEDICAL CARE IF:   There is increasing redness,   swelling, or pain in or around a wound.  There is a red line that goes up your leg.  Pus is coming from a wound.  You develop a fever or as directed by your health care provider.  You notice a bad smell coming from an ulcer or wound. Document Released: 12/27/1999 Document Revised: 08/31/2012 Document Reviewed: 06/07/2012 ExitCare Patient Information 2015 [employer]. This information is not intended to replace advice given to you by your health care provider. Make sure you discuss any questions you have with your health care provider.  

## 2014-01-10 NOTE — Progress Notes (Signed)
Subjective:     Patient ID: [name], male   DOB: [birth year], 58 y.o.   MRN: [id number]  HPI patient presents with thick yellow brittle nailbeds 1-5 both feet that are painful and he cannot cut and some concerns about the fourth toe on his left foot   Review of Systems     Objective:   Physical Exam Neurovascular status unchanged with thick yellow brittle nailbeds 1-5 both feet that are painful and he cannot take care of himself    Assessment:     Chronic mycotic nail infections with pain 1-5 both feet    Plan:     Review debridement and debrided nailbeds 1-5 both feet with no iatrogenic bleeding noted reappoint to recheck

## 2014-01-18 ENCOUNTER — Other Ambulatory Visit: Payer: Self-pay | Admitting: Internal Medicine

## 2014-01-31 ENCOUNTER — Other Ambulatory Visit: Payer: Self-pay | Admitting: Internal Medicine

## 2014-01-31 ENCOUNTER — Encounter: Payer: Self-pay | Admitting: Internal Medicine

## 2014-01-31 ENCOUNTER — Ambulatory Visit (AMBULATORY_SURGERY_CENTER): Payer: Medicare HMO | Admitting: Internal Medicine

## 2014-01-31 VITALS — BP 129/95 | HR 62 | Temp 98.2°F | Resp 15 | Ht 71.0 in | Wt 199.0 lb

## 2014-01-31 DIAGNOSIS — Z1211 Encounter for screening for malignant neoplasm of colon: Secondary | ICD-10-CM

## 2014-01-31 LAB — GLUCOSE, CAPILLARY
Glucose-Capillary: 82 mg/dL (ref 70–99)
Glucose-Capillary: 181 mg/dL — ABNORMAL HIGH (ref 70–99)

## 2014-01-31 MED ORDER — SODIUM CHLORIDE 0.9 % IV SOLN: 500.0000 mL | INTRAVENOUS | Status: DC

## 2014-01-31 NOTE — Progress Notes (Signed)
A/ox3, pleased with MAC, report to RN 

## 2014-01-31 NOTE — Patient Instructions (Signed)
YOU HAD AN ENDOSCOPIC PROCEDURE TODAY AT THE [facility]: Refer to the procedure report that was given to you for any specific questions about what was found during the examination.  If the procedure report does not answer your questions, please call your gastroenterologist to clarify.  If you requested that your care partner not be given the details of your procedure findings, then the procedure report has been included in a sealed envelope for you to review at your convenience later.  YOU SHOULD EXPECT: Some feelings of bloating in the abdomen. Passage of more gas than usual.  Walking can help get rid of the air that was put into your GI tract during the procedure and reduce the bloating. If you had a lower endoscopy (such as a colonoscopy or flexible sigmoidoscopy) you may notice spotting of blood in your stool or on the toilet paper. If you underwent a bowel prep for your procedure, then you may not have a normal bowel movement for a few days.  DIET: Your first meal following the procedure should be a light meal and then it is ok to progress to your normal diet.  A half-sandwich or bowl of soup is an example of a good first meal.  Heavy or fried foods are harder to digest and may make you feel nauseous or bloated.  Likewise meals heavy in dairy and vegetables can cause extra gas to form and this can also increase the bloating.  Drink plenty of fluids but you should avoid alcoholic beverages for 24 hours.  ACTIVITY: Your care partner should take you home directly after the procedure.  You should plan to take it easy, moving slowly for the rest of the day.  You can resume normal activity the day after the procedure however you should NOT DRIVE or use heavy machinery for 24 hours (because of the sedation medicines used during the test).    SYMPTOMS TO REPORT IMMEDIATELY: A gastroenterologist can be reached at any hour.  During normal business hours, 8:30 AM to 5:00 PM Monday through Friday,  call [phone].  After hours and on weekends, please call the GI answering service at [phone] who will take a message and have the physician on call contact you.   Following lower endoscopy (colonoscopy or flexible sigmoidoscopy):  Excessive amounts of blood in the stool  Significant tenderness or worsening of abdominal pains  Swelling of the abdomen that is new, acute  Fever of 100F or higher  FOLLOW UP: If any biopsies were taken you will be contacted by phone or by letter within the next 1-3 weeks.  Call your gastroenterologist if you have not heard about the biopsies in 3 weeks.  Our staff will call the home number listed on your records the next business day following your procedure to check on you and address any questions or concerns that you may have at that time regarding the information given to you following your procedure. This is a courtesy call and so if there is no answer at the home number and we have not heard from you through the emergency physician on call, we will assume that you have returned to your regular daily activities without incident.  SIGNATURES/CONFIDENTIALITY: You and/or your care partner have signed paperwork which will be entered into your electronic medical record.  These signatures attest to the fact that that the information above on your After Visit Summary has been reviewed and is understood.  Full responsibility of the confidentiality of this   discharge information lies with you and/or your care-partner.  Resume medications. 

## 2014-01-31 NOTE — Op Note (Signed)
[facility]  [address] [place], [address]   COLONOSCOPY PROCEDURE REPORT  PATIENT: [name], [name]  MR#: [id number] BIRTHDATE: Aug 29, 1955 , 63  yrs. old GENDER: male ENDOSCOPIST: Lafayette Dragon, MD REFERRED HQ:[id number] Advani, MD PROCEDURE DATE:  01/31/2014 PROCEDURE:   Colonoscopy, screening First Screening Colonoscopy - Avg.  risk and is 50 yrs.  old or older Yes.  Prior Negative Screening - Now for repeat screening. N/A  History of Adenoma - Now for follow-up colonoscopy & has been > or = to 3 yrs.  N/A  Polyps Removed Today? No.  Polyps Removed Today? No.  Recommend repeat exam, <10 yrs? Polyps Removed Today? No.  Recommend repeat exam, <10 yrs? Yes.  Polyps Removed Today? No.  Recommend repeat exam, <10 yrs? Yes.  Inadequate prep. ASA CLASS:   Class III INDICATIONS:average risk for colon cancer. MEDICATIONS: Monitored anesthesia care and Propofol 200 mg IV  DESCRIPTION OF PROCEDURE:   After the risks benefits and alternatives of the procedure were thoroughly explained, informed consent was obtained.  The digital rectal exam revealed no abnormalities of the rectum.   The [id number]  endoscope was introduced through the anus and advanced to the cecum, which was identified by both the appendix and ileocecal valve. No adverse events experienced.   The quality of the prep was poor, using MoviPrep  The instrument was then slowly withdrawn as the colon was fully examined.      COLON FINDINGS: A normal appearing cecum, ileocecal valve, and appendiceal orifice were identified.  The ascending, transverse, descending, sigmoid colon, and rectum appeared unremarkable. Retroflexed views revealed no abnormalities. The time to cecum=15 minutes 200 seconds.  Withdrawal time=6 minutes 48 seconds.  The scope was withdrawn and the procedure completed. COMPLICATIONS: There were no immediate complications.  ENDOSCOPIC IMPRESSION: Normal colonoscopy suboptimal  prep. No gross lesions  RECOMMENDATIONS: Rrecall colonoscopy in 5 years .The prep for next colonoscopy  eSigned:  Lafayette Dragon, MD 01/31/2014 2:28 PM   cc:   PATIENT NAME:  [name], [name] MR#: [id number]

## 2014-02-01 ENCOUNTER — Telehealth: Payer: Self-pay

## 2014-02-01 NOTE — Telephone Encounter (Signed)
  Follow up Call-  Call back number 01/31/2014  Post procedure Call Back phone  # [phone]  Permission to leave phone message Yes     Patient questions:  Do you have a fever, pain , or abdominal swelling? No. Pain Score  0 *  Have you tolerated food without any problems? Yes.    Have you been able to return to your normal activities? Yes.    Do you have any questions about your discharge instructions: Diet   No. Medications  No. Follow up visit  No.  Do you have questions or concerns about your Care? No.  Actions: * If pain score is 4 or above: No action needed, pain <4.

## 2014-02-05 ENCOUNTER — Other Ambulatory Visit: Payer: Self-pay | Admitting: Internal Medicine

## 2014-02-05 ENCOUNTER — Other Ambulatory Visit: Payer: Self-pay | Admitting: Emergency Medicine

## 2014-02-05 MED ORDER — PRAVASTATIN SODIUM 20 MG PO TABS: 20.0000 mg | ORAL_TABLET | Freq: Every morning | ORAL | Status: DC

## 2014-02-10 ENCOUNTER — Other Ambulatory Visit: Payer: Self-pay | Admitting: Internal Medicine

## 2014-02-26 ENCOUNTER — Other Ambulatory Visit: Payer: Self-pay | Admitting: Internal Medicine

## 2014-02-27 ENCOUNTER — Telehealth: Payer: Self-pay | Admitting: Internal Medicine

## 2014-02-27 NOTE — Telephone Encounter (Signed)
Patient can be given refill on the medication. 

## 2014-02-27 NOTE — Telephone Encounter (Signed)
Pt calling to request refill on ramipril (ALTACE) 10 MG capsule.  Pt is aware that he is about due for his 3 mo follow up and will be calling back to schedule appt. Please send refill to [facility] on [address].

## 2014-02-28 ENCOUNTER — Other Ambulatory Visit: Payer: Self-pay | Admitting: *Deleted

## 2014-03-01 NOTE — Telephone Encounter (Signed)
Pt aware, do not need to take Altace since taking Lisinopril

## 2014-03-01 NOTE — Telephone Encounter (Signed)
Left voice message to return call   Pt requesting Altace refills, don't need for this Rx to be refills.  Pt taking lisinopril at this time

## 2014-03-01 NOTE — Addendum Note (Signed)
Addended by: Betti Cruz on: 03/01/2014 01:34 PM   Modules accepted: Medications

## 2014-03-26 ENCOUNTER — Ambulatory Visit: Payer: Medicaid Other | Admitting: Internal Medicine

## 2014-03-29 ENCOUNTER — Other Ambulatory Visit: Payer: Self-pay | Admitting: Internal Medicine

## 2014-04-02 ENCOUNTER — Other Ambulatory Visit: Payer: Self-pay | Admitting: Internal Medicine

## 2014-04-03 ENCOUNTER — Encounter: Payer: Self-pay | Admitting: Internal Medicine

## 2014-04-03 ENCOUNTER — Ambulatory Visit: Payer: Medicare HMO | Attending: Internal Medicine | Admitting: Internal Medicine

## 2014-04-03 VITALS — BP 124/86 | HR 62 | Temp 98.0°F | Resp 16

## 2014-04-03 DIAGNOSIS — I639 Cerebral infarction, unspecified: Secondary | ICD-10-CM

## 2014-04-03 DIAGNOSIS — I10 Essential (primary) hypertension: Secondary | ICD-10-CM | POA: Diagnosis not present

## 2014-04-03 DIAGNOSIS — K2100 Gastro-esophageal reflux disease with esophagitis, without bleeding: Secondary | ICD-10-CM

## 2014-04-03 DIAGNOSIS — E119 Type 2 diabetes mellitus without complications: Secondary | ICD-10-CM | POA: Insufficient documentation

## 2014-04-03 DIAGNOSIS — Z23 Encounter for immunization: Secondary | ICD-10-CM | POA: Insufficient documentation

## 2014-04-03 DIAGNOSIS — I480 Paroxysmal atrial fibrillation: Secondary | ICD-10-CM

## 2014-04-03 DIAGNOSIS — Z8673 Personal history of transient ischemic attack (TIA), and cerebral infarction without residual deficits: Secondary | ICD-10-CM | POA: Insufficient documentation

## 2014-04-03 DIAGNOSIS — E139 Other specified diabetes mellitus without complications: Secondary | ICD-10-CM

## 2014-04-03 LAB — POCT GLYCOSYLATED HEMOGLOBIN (HGB A1C): HEMOGLOBIN A1C: 6.2

## 2014-04-03 LAB — COMPLETE METABOLIC PANEL WITH GFR
ALK PHOS: 49 U/L (ref 39–117)
ALT: <8 U/L (ref 0–53)
AST: 11 U/L (ref 0–37)
Albumin: 4.2 g/dL (ref 3.5–5.2)
BILIRUBIN TOTAL: 0.4 mg/dL (ref 0.2–1.2)
BUN: 21 mg/dL (ref 6–23)
CO2: 28 mEq/L (ref 19–32)
CREATININE: 1.16 mg/dL (ref 0.50–1.35)
Calcium: 9.6 mg/dL (ref 8.4–10.5)
Chloride: 99 mEq/L (ref 96–112)
GFR, EST NON AFRICAN AMERICAN: 69 mL/min
GFR, Est African American: 79 mL/min
Glucose, Bld: 125 mg/dL — ABNORMAL HIGH (ref 70–99)
Potassium: 4.6 mEq/L (ref 3.5–5.3)
Sodium: 133 mEq/L — ABNORMAL LOW (ref 135–145)
Total Protein: 6.7 g/dL (ref 6.0–8.3)

## 2014-04-03 LAB — GLUCOSE, POCT (MANUAL RESULT ENTRY): POC Glucose: 99 mg/dl (ref 70–99)

## 2014-04-03 MED ORDER — LISINOPRIL 20 MG PO TABS: ORAL_TABLET | ORAL | Status: DC

## 2014-04-03 MED ORDER — DILTIAZEM HCL ER COATED BEADS 180 MG PO CP24: ORAL_CAPSULE | ORAL | Status: DC

## 2014-04-03 NOTE — Progress Notes (Signed)
Patient here for follow up on his diabetes and HTN Patient is also requesting a referral for a home health aid

## 2014-04-03 NOTE — Progress Notes (Signed)
MRN: [id number] Name: [name]  Sex: male Age: 59 y.o. DOB: May 05, 1955  Allergies: Review of patient's allergies indicates no known allergies.  Chief Complaint  Patient presents with  . Follow-up    HPI: Patient is 59 y.o. male who has history of diabetes, hypertension, CAD, history of intracranial hemorrhage/stroke in the past, patient currently denies any acute symptoms, has been compliant with his medications, his diabetes and blood pressure is well controlled, he is requesting refill on his medications, he is also requesting referral for home health aid and as per patient he lives alone and needs help with cooking, cleaning.patient is using walker at home.as per patient he was getting physical therapy  and was discharged last month.  Past Medical History  Diagnosis Date  . Diabetes mellitus   . Coronary artery disease   . Hypertension   . Gout   . CHF (congestive heart failure)   . Arthritis   . Hypertensive emergency 11/19/2010  . Pulmonary edema 11/19/2010  . Acute exacerbation of congestive heart failure 11/19/2010  . Thyroiditis 11/20/2010  . ICH (intracerebral hemorrhage) 12/22/2010  . Stroke 11/19/2010  . Respiratory failure 11/19/2010  . CAD (coronary artery disease) 11/20/2010  . Physical deconditioning 12/22/2010  . Diabetes mellitus 11/19/2010  . A-fib 11/19/2010  . Hyperlipemia 11/21/2010  . Shortness of breath     Past Surgical History  Procedure Laterality Date  . Tracheostomy tube placement  11/28/2010    Procedure: TRACHEOSTOMY;  Surgeon: Beckie Salts, MD;  Location: [facility];  Service: ENT;  Laterality: N/A;  . Peg placement  12/03/2010    Procedure: PERCUTANEOUS ENDOSCOPIC GASTROSTOMY (PEG) PLACEMENT;  Surgeon: Lafayette Dragon, MD;  Location: [facility];  Service: Endoscopy;  Laterality: N/A;      Medication List       This list is accurate as of: 04/03/14 10:29 AM.  Always use your most recent med list.               allopurinol 100 MG tablet   Commonly known as:  ZYLOPRIM  Take 1 tablet (100 mg total) by mouth daily.     aspirin 81 MG tablet  Take 1 tablet (81 mg total) by mouth daily at 12 noon.     baclofen 10 MG tablet  Commonly known as:  LIORESAL  Take 0.5 tablets (5 mg total) by mouth 3 (three) times daily.     carvedilol 25 MG tablet  Commonly known as:  COREG  Take 1 tablet (25 mg total) by mouth 2 (two) times daily with a meal.     diltiazem 180 MG 24 hr capsule  Commonly known as:  CARDIZEM CD  TAKE 2 CAPSULES BY MOUTH DAILY AT NOON.     doxazosin 2 MG tablet  Commonly known as:  CARDURA  Take 1 tablet (2 mg total) by mouth at bedtime.     glucose monitoring kit monitoring kit  1 each by Does not apply route 4 (four) times daily - after meals and at bedtime. 1 month Diabetic Testing Supplies for QAC-QHS accuchecks.     hydrALAZINE 50 MG tablet  Commonly known as:  APRESOLINE  TAKE 2 AND 1/4 TABLETS TWICE A DAY     HYDROcodone-acetaminophen 5-325 MG per tablet  Commonly known as:  NORCO  Take 1 tablet by mouth every 6 (six) hours as needed.     isosorbide mononitrate 60 MG 24 hr tablet  Commonly known as:  IMDUR  TAKE 1  TABLET BY MOUTH TWICE DAILY.     lisinopril 20 MG tablet  Commonly known as:  PRINIVIL,ZESTRIL  TAKE 1 TABLET BY MOUTH TWICE DAILY     metFORMIN 850 MG tablet  Commonly known as:  GLUCOPHAGE  TAKE 1 TABLET BY MOUTH TWICE DAILY WITH A MEAL     pantoprazole 40 MG tablet  Commonly known as:  PROTONIX  Take 1 tablet (40 mg total) by mouth daily at 12 noon.     pravastatin 20 MG tablet  Commonly known as:  PRAVACHOL  Take 1 tablet (20 mg total) by mouth every morning.     spironolactone 25 MG tablet  Commonly known as:  ALDACTONE  TAKE 1 TABLET BY MOUTH EVERY DAY     UNABLE TO FIND  - Power wheelchair seating evaluation  -   - DX. History of stroke and pain in limbs        Meds ordered this encounter  Medications  . diltiazem (CARDIZEM CD) 180 MG 24 hr capsule     Sig: TAKE 2 CAPSULES BY MOUTH DAILY AT NOON.    Dispense:  60 capsule    Refill:  3  . lisinopril (PRINIVIL,ZESTRIL) 20 MG tablet    Sig: TAKE 1 TABLET BY MOUTH TWICE DAILY    Dispense:  60 tablet    Refill:  3    Immunization History  Administered Date(s) Administered  . Influenza Whole 11/21/2010  . Influenza,inj,Quad PF,36+ Mos 12/07/2012, 11/27/2013  . Pneumococcal Polysaccharide-23 11/21/2010    Family History  Problem Relation Age of Onset  . Colon cancer Neg Hx   . Colon polyps Neg Hx   . Diabetes Mother   . Diabetes Brother   . Kidney disease Neg Hx   . Esophageal cancer Neg Hx   . Heart disease Neg Hx   . Gallbladder disease Neg Hx     History  Substance Use Topics  . Smoking status: Former Smoker    Types: Cigarettes    Quit date: 12/19/2005  . Smokeless tobacco: Never Used  . Alcohol Use: No     Comment: quit in 2012    Review of Systems   As noted in HPI  Filed Vitals:   04/03/14 0959  BP: 124/86  Pulse: 62  Temp: 98 F (36.7 C)  Resp: 16    Physical Exam  Physical Exam  Constitutional:  Patient is in wheelchair not in acute distress  Eyes: EOM are normal. Pupils are equal, round, and reactive to light.  Cardiovascular: Normal rate and regular rhythm.   Pulmonary/Chest: Breath sounds normal. No respiratory distress. He has no wheezes. He has no rales.  Musculoskeletal:  Equal strength 4/5 in both lower extremities     CBC    Component Value Date/Time   WBC 5.3 09/02/2012 1251   RBC 4.42 09/02/2012 1251   HGB 13.1 09/02/2012 1251   HCT 39.0 09/02/2012 1251   PLT 274 09/02/2012 1251   MCV 88.2 09/02/2012 1251   LYMPHSABS 2.6 09/02/2012 1251   MONOABS 0.4 09/02/2012 1251   EOSABS 0.1 09/02/2012 1251   BASOSABS 0.0 09/02/2012 1251    CMP     Component Value Date/Time   NA 137 11/27/2013 1654   K 5.1 11/27/2013 1654   CL 101 11/27/2013 1654   CO2 27 11/27/2013 1654   GLUCOSE 128* 11/27/2013 1654   BUN 26* 11/27/2013 1654     CREATININE 1.31 11/27/2013 1654   CREATININE 1.26 08/20/2012 0600   CALCIUM  10.2 11/27/2013 1654   PROT 7.5 11/27/2013 1654   ALBUMIN 4.4 11/27/2013 1654   AST 11 11/27/2013 1654   ALT <8 11/27/2013 1654   ALKPHOS 53 11/27/2013 1654   BILITOT 0.3 11/27/2013 1654   GFRNONAA 59* 11/27/2013 1654   GFRNONAA 61* 08/20/2012 0600   GFRAA 68 11/27/2013 1654   GFRAA 71* 08/20/2012 0600    Lab Results  Component Value Date/Time   CHOL 108 09/02/2012 12:51 PM    No components found for: HGA1C  Lab Results  Component Value Date/Time   AST 11 11/27/2013 04:54 PM    Assessment and Plan  Other specified diabetes mellitus without complications - Plan:  Results for orders placed or performed in visit on 04/03/14  Glucose (CBG)  Result Value Ref Range   POC Glucose 99.0 70 - 99 mg/dl  HgB A1c  Result Value Ref Range   Hemoglobin A1C 6.20    Diabetes is well controlled, continue with current medication metformin COMPLETE METABOLIC PANEL WITH GFR  Gastroesophageal reflux disease with esophagitis Lifestyle modification, continue with Protonix  Essential hypertension - Plan:The pressure is well controlled, continue with current meds lisinopril (PRINIVIL,ZESTRIL) 20 MG tablet, COMPLETE METABOLIC PANEL WITH GFR  Paroxysmal atrial fibrillation - Plan: diltiazem (CARDIZEM CD) 180 MG 24 hr capsule  History of ICH/Stroke - Plan: Ambulatory referral to [facility] -Colonoscopy:up-to-date   -Vaccinations:  Updated with Pneumovax and flu shot  Return in about 3 months (around 07/04/2014) for diabetes, hypertension.   This note has been created with Surveyor, quantity. Any transcriptional errors are unintentional.    Lorayne Marek, MD

## 2014-04-03 NOTE — Patient Instructions (Signed)
Diabetes Mellitus and Food It is important for you to manage your blood sugar (glucose) level. Your blood glucose level can be greatly affected by what you eat. Eating healthier foods in the appropriate amounts throughout the day at about the same time each day will help you control your blood glucose level. It can also help slow or prevent worsening of your diabetes mellitus. Healthy eating may even help you improve the level of your blood pressure and reach or maintain a healthy weight.  HOW CAN FOOD AFFECT ME? Carbohydrates Carbohydrates affect your blood glucose level more than any other type of food. Your dietitian will help you determine how many carbohydrates to eat at each meal and teach you how to count carbohydrates. Counting carbohydrates is important to keep your blood glucose at a healthy level, especially if you are using insulin or taking certain medicines for diabetes mellitus. Alcohol Alcohol can cause sudden decreases in blood glucose (hypoglycemia), especially if you use insulin or take certain medicines for diabetes mellitus. Hypoglycemia can be a life-threatening condition. Symptoms of hypoglycemia (sleepiness, dizziness, and disorientation) are similar to symptoms of having too much alcohol.  If your health care provider has given you approval to drink alcohol, do so in moderation and use the following guidelines:  Women should not have more than one drink per day, and men should not have more than two drinks per day. One drink is equal to:  12 oz of beer.  5 oz of wine.  1 oz of hard liquor.  Do not drink on an empty stomach.  Keep yourself hydrated. Have water, diet soda, or unsweetened iced tea.  Regular soda, juice, and other mixers might contain a lot of carbohydrates and should be counted. WHAT FOODS ARE NOT RECOMMENDED? As you make food choices, it is important to remember that all foods are not the same. Some foods have fewer nutrients per serving than other  foods, even though they might have the same number of calories or carbohydrates. It is difficult to get your body what it needs when you eat foods with fewer nutrients. Examples of foods that you should avoid that are high in calories and carbohydrates but low in nutrients include:  Trans fats (most processed foods list trans fats on the Nutrition Facts label).  Regular soda.  Juice.  Candy.  Sweets, such as cake, pie, doughnuts, and cookies.  Fried foods. WHAT FOODS CAN I EAT? Have nutrient-rich foods, which will nourish your body and keep you healthy. The food you should eat also will depend on several factors, including:  The calories you need.  The medicines you take.  Your weight.  Your blood glucose level.  Your blood pressure level.  Your cholesterol level. You also should eat a variety of foods, including:  Protein, such as meat, poultry, fish, tofu, nuts, and seeds (lean animal proteins are best).  Fruits.  Vegetables.  Dairy products, such as milk, cheese, and yogurt (low fat is best).  Breads, grains, pasta, cereal, rice, and beans.  Fats such as olive oil, trans fat-free margarine, canola oil, avocado, and olives. DOES EVERYONE WITH DIABETES MELLITUS HAVE THE SAME MEAL PLAN? Because every person with diabetes mellitus is different, there is not one meal plan that works for everyone. It is very important that you meet with a dietitian who will help you create a meal plan that is just right for you. Document Released: 09/25/2004 Document Revised: 01/03/2013 Document Reviewed: 11/25/2012 ExitCare Patient Information 2015 [employer]. This   information is not intended to replace advice given to you by your health care provider. Make sure you discuss any questions you have with your health care provider. DASH Eating Plan DASH stands for "Dietary Approaches to Stop Hypertension." The DASH eating plan is a healthy eating plan that has been shown to reduce high  blood pressure (hypertension). Additional health benefits may include reducing the risk of type 2 diabetes mellitus, heart disease, and stroke. The DASH eating plan may also help with weight loss. WHAT DO I NEED TO KNOW ABOUT THE DASH EATING PLAN? For the DASH eating plan, you will follow these general guidelines:  Choose foods with a percent daily value for sodium of less than 5% (as listed on the food label).  Use salt-free seasonings or herbs instead of table salt or sea salt.  Check with your health care provider or pharmacist before using salt substitutes.  Eat lower-sodium products, often labeled as "lower sodium" or "no salt added."  Eat fresh foods.  Eat more vegetables, fruits, and low-fat dairy products.  Choose whole grains. Look for the word "whole" as the first word in the ingredient list.  Choose fish and skinless chicken or turkey more often than red meat. Limit fish, poultry, and meat to 6 oz (170 g) each day.  Limit sweets, desserts, sugars, and sugary drinks.  Choose heart-healthy fats.  Limit cheese to 1 oz (28 g) per day.  Eat more home-cooked food and less restaurant, buffet, and fast food.  Limit fried foods.  Cook foods using methods other than frying.  Limit canned vegetables. If you do use them, rinse them well to decrease the sodium.  When eating at a restaurant, ask that your food be prepared with less salt, or no salt if possible. WHAT FOODS CAN I EAT? Seek help from a dietitian for individual calorie needs. Grains Whole grain or whole wheat bread. Brown rice. Whole grain or whole wheat pasta. Quinoa, bulgur, and whole grain cereals. Low-sodium cereals. Corn or whole wheat flour tortillas. Whole grain cornbread. Whole grain crackers. Low-sodium crackers. Vegetables Fresh or frozen vegetables (raw, steamed, roasted, or grilled). Low-sodium or reduced-sodium tomato and vegetable juices. Low-sodium or reduced-sodium tomato sauce and paste. Low-sodium  or reduced-sodium canned vegetables.  Fruits All fresh, canned (in natural juice), or frozen fruits. Meat and Other Protein Products Ground beef (85% or leaner), grass-fed beef, or beef trimmed of fat. Skinless chicken or turkey. Ground chicken or turkey. Pork trimmed of fat. All fish and seafood. Eggs. Dried beans, peas, or lentils. Unsalted nuts and seeds. Unsalted canned beans. Dairy Low-fat dairy products, such as skim or 1% milk, 2% or reduced-fat cheeses, low-fat ricotta or cottage cheese, or plain low-fat yogurt. Low-sodium or reduced-sodium cheeses. Fats and Oils Tub margarines without trans fats. Light or reduced-fat mayonnaise and salad dressings (reduced sodium). Avocado. Safflower, olive, or canola oils. Natural peanut or almond butter. Other Unsalted popcorn and pretzels. The items listed above may not be a complete list of recommended foods or beverages. Contact your dietitian for more options. WHAT FOODS ARE NOT RECOMMENDED? Grains White bread. White pasta. White rice. Refined cornbread. Bagels and croissants. Crackers that contain trans fat. Vegetables Creamed or fried vegetables. Vegetables in a cheese sauce. Regular canned vegetables. Regular canned tomato sauce and paste. Regular tomato and vegetable juices. Fruits Dried fruits. Canned fruit in light or heavy syrup. Fruit juice. Meat and Other Protein Products Fatty cuts of meat. Ribs, chicken wings, bacon, sausage, bologna, salami, chitterlings, fatback, hot   dogs, bratwurst, and packaged luncheon meats. Salted nuts and seeds. Canned beans with salt. Dairy Whole or 2% milk, cream, half-and-half, and cream cheese. Whole-fat or sweetened yogurt. Full-fat cheeses or blue cheese. Nondairy creamers and whipped toppings. Processed cheese, cheese spreads, or cheese curds. Condiments Onion and garlic salt, seasoned salt, table salt, and sea salt. Canned and packaged gravies. Worcestershire sauce. Tartar sauce. Barbecue sauce.  Teriyaki sauce. Soy sauce, including reduced sodium. Steak sauce. Fish sauce. Oyster sauce. Cocktail sauce. Horseradish. Ketchup and mustard. Meat flavorings and tenderizers. Bouillon cubes. Hot sauce. Tabasco sauce. Marinades. Taco seasonings. Relishes. Fats and Oils Butter, stick margarine, lard, shortening, ghee, and bacon fat. Coconut, palm kernel, or palm oils. Regular salad dressings. Other Pickles and olives. Salted popcorn and pretzels. The items listed above may not be a complete list of foods and beverages to avoid. Contact your dietitian for more information. WHERE CAN I FIND MORE INFORMATION? National Heart, Lung, and Blood Institute: www.nhlbi.nih.gov/health/health-topics/topics/dash/ Document Released: 12/18/2010 Document Revised: 05/15/2013 Document Reviewed: 11/02/2012 ExitCare Patient Information 2015 [employer]. This information is not intended to replace advice given to you by your health care provider. Make sure you discuss any questions you have with your health care provider.  

## 2014-04-04 ENCOUNTER — Encounter: Payer: Self-pay | Admitting: Podiatry

## 2014-04-04 ENCOUNTER — Ambulatory Visit (INDEPENDENT_AMBULATORY_CARE_PROVIDER_SITE_OTHER): Payer: Medicare HMO | Admitting: Podiatry

## 2014-04-04 VITALS — BP 132/91 | HR 71 | Resp 18

## 2014-04-04 DIAGNOSIS — B351 Tinea unguium: Secondary | ICD-10-CM

## 2014-04-04 DIAGNOSIS — M79673 Pain in unspecified foot: Secondary | ICD-10-CM | POA: Diagnosis not present

## 2014-04-04 NOTE — Patient Instructions (Signed)
Diabetes and Foot Care Diabetes may cause you to have problems because of poor blood supply (circulation) to your feet and legs. This may cause the skin on your feet to become thinner, break easier, and heal more slowly. Your skin may become dry, and the skin may peel and crack. You may also have nerve damage in your legs and feet causing decreased feeling in them. You may not notice minor injuries to your feet that could lead to infections or more serious problems. Taking care of your feet is one of the most important things you can do for yourself.  HOME CARE INSTRUCTIONS  Wear shoes at all times, even in the house. Do not go barefoot. Bare feet are easily injured.  Check your feet daily for blisters, cuts, and redness. If you cannot see the bottom of your feet, use a mirror or ask someone for help.  Wash your feet with warm water (do not use hot water) and mild soap. Then pat your feet and the areas between your toes until they are completely dry. Do not soak your feet as this can dry your skin.  Apply a moisturizing lotion or petroleum jelly (that does not contain alcohol and is unscented) to the skin on your feet and to dry, brittle toenails. Do not apply lotion between your toes.  Trim your toenails straight across. Do not dig under them or around the cuticle. File the edges of your nails with an emery board or nail file.  Do not cut corns or calluses or try to remove them with medicine.  Wear clean socks or stockings every day. Make sure they are not too tight. Do not wear knee-high stockings since they may decrease blood flow to your legs.  Wear shoes that fit properly and have enough cushioning. To break in new shoes, wear them for just a few hours a day. This prevents you from injuring your feet. Always look in your shoes before you put them on to be sure there are no objects inside.  Do not cross your legs. This may decrease the blood flow to your feet.  If you find a minor scrape,  cut, or break in the skin on your feet, keep it and the skin around it clean and dry. These areas may be cleansed with mild soap and water. Do not cleanse the area with peroxide, alcohol, or iodine.  When you remove an adhesive bandage, be sure not to damage the skin around it.  If you have a wound, look at it several times a day to make sure it is healing.  Do not use heating pads or hot water bottles. They may burn your skin. If you have lost feeling in your feet or legs, you may not know it is happening until it is too late.  Make sure your health care provider performs a complete foot exam at least annually or more often if you have foot problems. Report any cuts, sores, or bruises to your health care provider immediately. SEEK MEDICAL CARE IF:   You have an injury that is not healing.  You have cuts or breaks in the skin.  You have an ingrown nail.  You notice redness on your legs or feet.  You feel burning or tingling in your legs or feet.  You have pain or cramps in your legs and feet.  Your legs or feet are numb.  Your feet always feel cold. SEEK IMMEDIATE MEDICAL CARE IF:   There is increasing redness,   swelling, or pain in or around a wound.  There is a red line that goes up your leg.  Pus is coming from a wound.  You develop a fever or as directed by your health care provider.  You notice a bad smell coming from an ulcer or wound. Document Released: 12/27/1999 Document Revised: 08/31/2012 Document Reviewed: 06/07/2012 ExitCare Patient Information 2015 [employer]. This information is not intended to replace advice given to you by your health care provider. Make sure you discuss any questions you have with your health care provider.  

## 2014-04-04 NOTE — Progress Notes (Signed)
Patient ID: [name], male   DOB: [birth year], 59 y.o.   MRN: [id number]  Subjective: 59 y.o.-year-old male returns the office today for painful, elongated, thickened toenails for which he cannot cut himself. Denies any redness or drainage around the nails. Denies any acute changes since last appointment and no new complaints today. Denies any systemic complaints such as fevers, chills, nausea, vomiting.   Objective: AAO 3, NAD DP/PT pulses palpable 1/4, CRT less than 3 seconds Protective sensation intact with Simms Weinstein monofilament, Achilles tendon reflex intact.  Nails hypertrophic, dystrophic, elongated, brittle, discolored 10. There is tenderness overlying the nails 1-5 bilaterally. There is no surrounding erythema or drainage along the nail sites. No open lesions or pre-ulcerative lesions are identified. No other areas of tenderness bilateral lower extremities. No overlying edema, erythema, increased warmth. No pain with calf compression, swelling, warmth, erythema.  Assessment: Patient presents with symptomatic onychomycosis  Plan: -Treatment options including alternatives, risks, complications were discussed -Nails sharply debrided 10 without complication/bleeding. -Discussed daily foot inspection. If there are any changes, to call the office immediately.  -Follow-up in 3 months or sooner if any problems are to arise. In the meantime, encouraged to call the office with any questions, concerns, changes symptoms.

## 2014-04-09 ENCOUNTER — Ambulatory Visit: Payer: Medicare HMO

## 2014-04-12 ENCOUNTER — Other Ambulatory Visit: Payer: Self-pay | Admitting: Internal Medicine

## 2014-04-16 ENCOUNTER — Other Ambulatory Visit: Payer: Self-pay | Admitting: Internal Medicine

## 2014-04-17 ENCOUNTER — Telehealth: Payer: Self-pay | Admitting: Internal Medicine

## 2014-04-17 NOTE — Telephone Encounter (Signed)
Pt called requesting medication refill for allopurinol (ZYLOPRIM) 100 MG tablet . Patient uses [facility] on [address] . Please f/u with pt

## 2014-04-19 ENCOUNTER — Encounter: Payer: Self-pay | Admitting: Emergency Medicine

## 2014-04-19 NOTE — Telephone Encounter (Signed)
error 

## 2014-04-26 ENCOUNTER — Other Ambulatory Visit: Payer: Self-pay | Admitting: Internal Medicine

## 2014-05-02 ENCOUNTER — Other Ambulatory Visit: Payer: Self-pay | Admitting: Internal Medicine

## 2014-05-14 ENCOUNTER — Other Ambulatory Visit: Payer: Self-pay | Admitting: Internal Medicine

## 2014-05-15 ENCOUNTER — Other Ambulatory Visit: Payer: Self-pay | Admitting: Internal Medicine

## 2014-05-18 ENCOUNTER — Other Ambulatory Visit: Payer: Self-pay | Admitting: *Deleted

## 2014-05-18 MED ORDER — HYDRALAZINE HCL 50 MG PO TABS: ORAL_TABLET | ORAL | Status: DC

## 2014-05-18 MED ORDER — ALLOPURINOL 100 MG PO TABS: 100.0000 mg | ORAL_TABLET | Freq: Every day | ORAL | Status: DC

## 2014-05-18 MED ORDER — METFORMIN HCL 850 MG PO TABS: 850.0000 mg | ORAL_TABLET | Freq: Every day | ORAL | Status: DC

## 2014-05-23 ENCOUNTER — Other Ambulatory Visit: Payer: Self-pay | Admitting: *Deleted

## 2014-05-23 MED ORDER — ALLOPURINOL 100 MG PO TABS: 100.0000 mg | ORAL_TABLET | Freq: Every day | ORAL | Status: DC

## 2014-05-30 ENCOUNTER — Other Ambulatory Visit: Payer: Self-pay | Admitting: Internal Medicine

## 2014-05-31 ENCOUNTER — Other Ambulatory Visit: Payer: Self-pay | Admitting: Internal Medicine

## 2014-06-04 ENCOUNTER — Other Ambulatory Visit: Payer: Self-pay

## 2014-06-04 DIAGNOSIS — I10 Essential (primary) hypertension: Secondary | ICD-10-CM

## 2014-06-06 ENCOUNTER — Telehealth: Payer: Self-pay | Admitting: Internal Medicine

## 2014-06-06 NOTE — Telephone Encounter (Signed)
Patient called requesting medication refill for spironolactone (ALDACTONE) 25 MG tablet. Patient states he is completely out of medication,please f/u with patient  Patient uses [facility] on [address]

## 2014-06-07 ENCOUNTER — Other Ambulatory Visit: Payer: Self-pay | Admitting: Internal Medicine

## 2014-06-07 MED ORDER — SPIRONOLACTONE 25 MG PO TABS: 25.0000 mg | ORAL_TABLET | Freq: Every day | ORAL | Status: DC

## 2014-06-07 NOTE — Telephone Encounter (Signed)
Patient called requesting a refill on his aldactone Prescription sent to the [facility] on file

## 2014-06-07 NOTE — Telephone Encounter (Signed)
Patient called requesting medication refill for spironolactone (ALDACTONE) 25 MG tablet. Patient states he is completely out of medication,please f/u with patient  Patient uses [facility] on [address]

## 2014-06-14 ENCOUNTER — Other Ambulatory Visit: Payer: Self-pay | Admitting: Internal Medicine

## 2014-06-27 ENCOUNTER — Other Ambulatory Visit: Payer: Self-pay | Admitting: Internal Medicine

## 2014-06-27 ENCOUNTER — Telehealth: Payer: Self-pay | Admitting: Internal Medicine

## 2014-06-27 ENCOUNTER — Telehealth: Payer: Self-pay

## 2014-06-27 MED ORDER — PRAVASTATIN SODIUM 20 MG PO TABS: 20.0000 mg | ORAL_TABLET | Freq: Every morning | ORAL | Status: DC

## 2014-06-27 MED ORDER — SPIRONOLACTONE 25 MG PO TABS: 25.0000 mg | ORAL_TABLET | Freq: Every day | ORAL | Status: DC

## 2014-06-27 MED ORDER — DOXAZOSIN MESYLATE 2 MG PO TABS: 2.0000 mg | ORAL_TABLET | Freq: Every day | ORAL | Status: DC

## 2014-06-27 NOTE — Telephone Encounter (Signed)
Patient has called in today to request a medication refill for baclofen; please f/u with patient about request

## 2014-06-27 NOTE — Telephone Encounter (Signed)
Patient called requesting a refill on his cholesterol medication and aldactone Prescriptions sent to pharmacy on file

## 2014-06-28 NOTE — Telephone Encounter (Signed)
Patient requesting a refill on baclofen This has not been refilled since 2015 Is this patient supposed to be taking this?

## 2014-06-29 NOTE — Telephone Encounter (Signed)
Check with Patient if he is still experiencing  any muscle spasm/stiffness, he can be given refill

## 2014-07-03 ENCOUNTER — Other Ambulatory Visit: Payer: Self-pay | Admitting: Internal Medicine

## 2014-07-03 DIAGNOSIS — I10 Essential (primary) hypertension: Secondary | ICD-10-CM

## 2014-07-04 ENCOUNTER — Other Ambulatory Visit: Payer: Self-pay | Admitting: Internal Medicine

## 2014-07-04 ENCOUNTER — Ambulatory Visit: Payer: Medicaid Other | Admitting: Podiatry

## 2014-07-04 NOTE — Telephone Encounter (Signed)
Patient requested a refill on his lisinopril Prescription sent to pharmacy of file

## 2014-07-04 NOTE — Telephone Encounter (Signed)
Patient called checking on status of medication refill for baclofen. Patient states he is still having muscle spasms, please f/u

## 2014-07-09 ENCOUNTER — Telehealth: Payer: Self-pay

## 2014-07-09 ENCOUNTER — Telehealth: Payer: Self-pay | Admitting: Internal Medicine

## 2014-07-09 ENCOUNTER — Other Ambulatory Visit: Payer: Self-pay

## 2014-07-09 NOTE — Telephone Encounter (Signed)
Returned patient phone call Patient called to make [place] aware that future prescriptions need to Be written for three month supply for insurance purposes

## 2014-07-09 NOTE — Telephone Encounter (Signed)
Pt requesting refill on medications, please send to [facility] on [address]  Pt will call back to reschedule appt.

## 2014-07-17 ENCOUNTER — Other Ambulatory Visit: Payer: Self-pay | Admitting: Internal Medicine

## 2014-07-23 ENCOUNTER — Ambulatory Visit: Payer: Medicare HMO | Admitting: Internal Medicine

## 2014-07-24 ENCOUNTER — Ambulatory Visit: Payer: Medicare HMO | Admitting: Internal Medicine

## 2014-07-25 ENCOUNTER — Ambulatory Visit: Payer: Medicaid Other | Attending: Internal Medicine | Admitting: Internal Medicine

## 2014-07-25 ENCOUNTER — Other Ambulatory Visit: Payer: Self-pay | Admitting: Internal Medicine

## 2014-07-25 ENCOUNTER — Encounter: Payer: Self-pay | Admitting: Internal Medicine

## 2014-07-25 VITALS — BP 119/86 | HR 58 | Temp 98.0°F | Resp 16 | Wt 209.4 lb

## 2014-07-25 DIAGNOSIS — E119 Type 2 diabetes mellitus without complications: Secondary | ICD-10-CM | POA: Insufficient documentation

## 2014-07-25 DIAGNOSIS — E139 Other specified diabetes mellitus without complications: Secondary | ICD-10-CM

## 2014-07-25 DIAGNOSIS — B079 Viral wart, unspecified: Secondary | ICD-10-CM

## 2014-07-25 DIAGNOSIS — K2100 Gastro-esophageal reflux disease with esophagitis, without bleeding: Secondary | ICD-10-CM

## 2014-07-25 DIAGNOSIS — I480 Paroxysmal atrial fibrillation: Secondary | ICD-10-CM | POA: Diagnosis not present

## 2014-07-25 DIAGNOSIS — I10 Essential (primary) hypertension: Secondary | ICD-10-CM | POA: Insufficient documentation

## 2014-07-25 DIAGNOSIS — B078 Other viral warts: Secondary | ICD-10-CM | POA: Diagnosis not present

## 2014-07-25 DIAGNOSIS — Z7982 Long term (current) use of aspirin: Secondary | ICD-10-CM | POA: Diagnosis not present

## 2014-07-25 LAB — COMPLETE METABOLIC PANEL WITH GFR
ALT: 10 U/L (ref 0–53)
AST: 12 U/L (ref 0–37)
Albumin: 3.9 g/dL (ref 3.5–5.2)
Alkaline Phosphatase: 44 U/L (ref 39–117)
BUN: 22 mg/dL (ref 6–23)
CALCIUM: 9.6 mg/dL (ref 8.4–10.5)
CHLORIDE: 103 meq/L (ref 96–112)
CO2: 25 mEq/L (ref 19–32)
Creat: 1.19 mg/dL (ref 0.50–1.35)
GFR, Est African American: 77 mL/min
GFR, Est Non African American: 66 mL/min
GLUCOSE: 103 mg/dL — AB (ref 70–99)
POTASSIUM: 4.9 meq/L (ref 3.5–5.3)
Sodium: 139 mEq/L (ref 135–145)
TOTAL PROTEIN: 6.6 g/dL (ref 6.0–8.3)
Total Bilirubin: 0.3 mg/dL (ref 0.2–1.2)

## 2014-07-25 LAB — POCT GLYCOSYLATED HEMOGLOBIN (HGB A1C): Hemoglobin A1C: 6.2

## 2014-07-25 LAB — GLUCOSE, POCT (MANUAL RESULT ENTRY): POC GLUCOSE: 96 mg/dL (ref 70–99)

## 2014-07-25 MED ORDER — HYDRALAZINE HCL 50 MG PO TABS: ORAL_TABLET | ORAL | Status: DC

## 2014-07-25 MED ORDER — BACLOFEN 10 MG PO TABS: 5.0000 mg | ORAL_TABLET | Freq: Three times a day (TID) | ORAL | Status: DC

## 2014-07-25 MED ORDER — LISINOPRIL 20 MG PO TABS: 20.0000 mg | ORAL_TABLET | Freq: Two times a day (BID) | ORAL | Status: DC

## 2014-07-25 MED ORDER — DILTIAZEM HCL ER COATED BEADS 180 MG PO CP24: ORAL_CAPSULE | ORAL | Status: DC

## 2014-07-25 MED ORDER — ISOSORBIDE MONONITRATE ER 60 MG PO TB24: ORAL_TABLET | ORAL | Status: DC

## 2014-07-25 MED ORDER — CARVEDILOL 25 MG PO TABS: 25.0000 mg | ORAL_TABLET | Freq: Two times a day (BID) | ORAL | Status: DC

## 2014-07-25 MED ORDER — METFORMIN HCL 850 MG PO TABS: 850.0000 mg | ORAL_TABLET | Freq: Two times a day (BID) | ORAL | Status: DC

## 2014-07-25 MED ORDER — SPIRONOLACTONE 25 MG PO TABS: 25.0000 mg | ORAL_TABLET | Freq: Every day | ORAL | Status: DC

## 2014-07-25 MED ORDER — DOXAZOSIN MESYLATE 2 MG PO TABS: 2.0000 mg | ORAL_TABLET | Freq: Every day | ORAL | Status: DC

## 2014-07-25 MED ORDER — PRAVASTATIN SODIUM 20 MG PO TABS: 20.0000 mg | ORAL_TABLET | Freq: Every morning | ORAL | Status: DC

## 2014-07-25 MED ORDER — PANTOPRAZOLE SODIUM 40 MG PO TBEC: 40.0000 mg | DELAYED_RELEASE_TABLET | Freq: Every day | ORAL | Status: DC

## 2014-07-25 NOTE — Progress Notes (Signed)
Patient here for a follow up on his diabetes and in need of medication refills Patient also complains of a spot on his left arm that has been there about a week

## 2014-07-25 NOTE — Progress Notes (Signed)
MRN: [id number] Name: [name]  Sex: male Age: 59 y.o. DOB: November 06, 1955  Allergies: Review of patient's allergies indicates no known allergies.  Chief Complaint  Patient presents with  . Follow-up    HPI: Patient is 59 y.o. male who has history of diabetes, CAD, hypertension, history of intracranial hemorrhage/stroke, comes today for followup requesting refill on his medications, denies any headache dizziness chest and shortness of breath, he is complaining of having a wart in his right hand finger for several weeks and would like it to be removed, denies any fever chills any discharge.  Past Medical History  Diagnosis Date  . Diabetes mellitus   . Coronary artery disease   . Hypertension   . Gout   . CHF (congestive heart failure)   . Arthritis   . Hypertensive emergency 11/19/2010  . Pulmonary edema 11/19/2010  . Acute exacerbation of congestive heart failure 11/19/2010  . Thyroiditis 11/20/2010  . ICH (intracerebral hemorrhage) 12/22/2010  . Stroke 11/19/2010  . Respiratory failure 11/19/2010  . CAD (coronary artery disease) 11/20/2010  . Physical deconditioning 12/22/2010  . Diabetes mellitus 11/19/2010  . A-fib 11/19/2010  . Hyperlipemia 11/21/2010  . Shortness of breath     Past Surgical History  Procedure Laterality Date  . Tracheostomy tube placement  11/28/2010    Procedure: TRACHEOSTOMY;  Surgeon: Beckie Salts, MD;  Location: [facility];  Service: ENT;  Laterality: N/A;  . Peg placement  12/03/2010    Procedure: PERCUTANEOUS ENDOSCOPIC GASTROSTOMY (PEG) PLACEMENT;  Surgeon: Lafayette Dragon, MD;  Location[facility] ENDOSCOPY;  Service: Endoscopy;  Laterality: N/A;      Medication List       This list is accurate as of: 07/25/14 12:49 PM.  Always use your most recent med list.               Adjustable Lancing Device Misc     Alcohol Prep 70 % Pads     allopurinol 100 MG tablet  Commonly known as:  ZYLOPRIM  Take 1 tablet (100 mg total) by mouth daily.     aspirin 81 MG tablet  Take 1 tablet (81 mg total) by mouth daily at 12 noon.     ASSURE COMFORT LANCETS 30G Misc     baclofen 10 MG tablet  Commonly known as:  LIORESAL  Take 0.5 tablets (5 mg total) by mouth 3 (three) times daily.     CARESENS CONTROL A Soln     CARESENS N GLUCOSE TEST test strip  Generic drug:  glucose blood     carvedilol 25 MG tablet  Commonly known as:  COREG  Take 1 tablet (25 mg total) by mouth 2 (two) times daily with a meal.     diltiazem 180 MG 24 hr capsule  Commonly known as:  CARDIZEM CD  TAKE 2 CAPSULES BY MOUTH DAILY AT NOON.     doxazosin 2 MG tablet  Commonly known as:  CARDURA  Take 1 tablet (2 mg total) by mouth at bedtime.     FEBUXOSTAT PO  Take by mouth.     glucose monitoring kit monitoring kit  1 each by Does not apply route 4 (four) times daily - after meals and at bedtime. 1 month Diabetic Testing Supplies for QAC-QHS accuchecks.     CARESENS N GLUCOSE SYSTEM Devi     hydrALAZINE 50 MG tablet  Commonly known as:  APRESOLINE  TAKE 2 AND 1/4 TABLETS TWICE A DAY  HYDROcodone-acetaminophen 5-325 MG per tablet  Commonly known as:  NORCO  Take 1 tablet by mouth every 6 (six) hours as needed.     isosorbide mononitrate 60 MG 24 hr tablet  Commonly known as:  IMDUR  TAKE 1 TABLET BY MOUTH TWICE DAILY.     lisinopril 20 MG tablet  Commonly known as:  PRINIVIL,ZESTRIL  Take 1 tablet (20 mg total) by mouth 2 (two) times daily.     metFORMIN 850 MG tablet  Commonly known as:  GLUCOPHAGE  Take 1 tablet (850 mg total) by mouth 2 (two) times daily with a meal.     pantoprazole 40 MG tablet  Commonly known as:  PROTONIX  Take 1 tablet (40 mg total) by mouth daily at 12 noon.     pravastatin 20 MG tablet  Commonly known as:  PRAVACHOL  Take 1 tablet (20 mg total) by mouth every morning.     spironolactone 25 MG tablet  Commonly known as:  ALDACTONE  Take 1 tablet (25 mg total) by mouth daily.     UNABLE TO FIND  Power  wheelchair seating evaluation  DX. History of stroke and pain in limbs        Meds ordered this encounter  Medications  . FEBUXOSTAT PO    Sig: Take by mouth.  . baclofen (LIORESAL) 10 MG tablet    Sig: Take 0.5 tablets (5 mg total) by mouth 3 (three) times daily.    Dispense:  90 tablet    Refill:  1    To prevent hiccups.  In a few days try to taper to twice a day for a week. If hiccups do not recur then decrease to  once a day as tolerated.  . carvedilol (COREG) 25 MG tablet    Sig: Take 1 tablet (25 mg total) by mouth 2 (two) times daily with a meal.    Dispense:  120 tablet    Refill:  3  . diltiazem (CARDIZEM CD) 180 MG 24 hr capsule    Sig: TAKE 2 CAPSULES BY MOUTH DAILY AT NOON.    Dispense:  180 capsule    Refill:  3  . doxazosin (CARDURA) 2 MG tablet    Sig: Take 1 tablet (2 mg total) by mouth at bedtime.    Dispense:  90 tablet    Refill:  1  . hydrALAZINE (APRESOLINE) 50 MG tablet    Sig: TAKE 2 AND 1/4 TABLETS TWICE A DAY    Dispense:  135 tablet    Refill:  3  . isosorbide mononitrate (IMDUR) 60 MG 24 hr tablet    Sig: TAKE 1 TABLET BY MOUTH TWICE DAILY.    Dispense:  90 tablet    Refill:  3  . lisinopril (PRINIVIL,ZESTRIL) 20 MG tablet    Sig: Take 1 tablet (20 mg total) by mouth 2 (two) times daily.    Dispense:  90 tablet    Refill:  3    **Patient requests 90 days supply**  . metFORMIN (GLUCOPHAGE) 850 MG tablet    Sig: Take 1 tablet (850 mg total) by mouth 2 (two) times daily with a meal.    Dispense:  180 tablet    Refill:  1  . pantoprazole (PROTONIX) 40 MG tablet    Sig: Take 1 tablet (40 mg total) by mouth daily at 12 noon.    Dispense:  120 tablet    Refill:  3  . pravastatin (PRAVACHOL) 20 MG tablet  Sig: Take 1 tablet (20 mg total) by mouth every morning.    Dispense:  90 tablet    Refill:  2  . spironolactone (ALDACTONE) 25 MG tablet    Sig: Take 1 tablet (25 mg total) by mouth daily.    Dispense:  90 tablet    Refill:  1     Immunization History  Administered Date(s) Administered  . Influenza Whole 11/21/2010  . Influenza,inj,Quad PF,36+ Mos 12/07/2012, 11/27/2013  . Pneumococcal Polysaccharide-23 11/21/2010    Family History  Problem Relation Age of Onset  . Colon cancer Neg Hx   . Colon polyps Neg Hx   . Diabetes Mother   . Diabetes Brother   . Kidney disease Neg Hx   . Esophageal cancer Neg Hx   . Heart disease Neg Hx   . Gallbladder disease Neg Hx     History  Substance Use Topics  . Smoking status: Former Smoker    Types: Cigarettes    Quit date: 12/19/2005  . Smokeless tobacco: Never Used  . Alcohol Use: No     Comment: quit in 2012    Review of Systems   As noted in HPI  Filed Vitals:   07/25/14 1120  BP: 119/86  Pulse: 58  Temp: 98 F (36.7 C)  Resp: 16    Physical Exam  Physical Exam  Constitutional: No distress.  Eyes: EOM are normal. Pupils are equal, round, and reactive to light.  Cardiovascular: Normal rate and regular rhythm.   Pulmonary/Chest: Breath sounds normal. No respiratory distress. He has no wheezes. He has no rales.    CBC    Component Value Date/Time   WBC 5.3 09/02/2012 1251   RBC 4.42 09/02/2012 1251   HGB 13.1 09/02/2012 1251   HCT 39.0 09/02/2012 1251   PLT 274 09/02/2012 1251   MCV 88.2 09/02/2012 1251   LYMPHSABS 2.6 09/02/2012 1251   MONOABS 0.4 09/02/2012 1251   EOSABS 0.1 09/02/2012 1251   BASOSABS 0.0 09/02/2012 1251    CMP     Component Value Date/Time   NA 133* 04/03/2014 1015   K 4.6 04/03/2014 1015   CL 99 04/03/2014 1015   CO2 28 04/03/2014 1015   GLUCOSE 125* 04/03/2014 1015   BUN 21 04/03/2014 1015   CREATININE 1.16 04/03/2014 1015   CREATININE 1.26 08/20/2012 0600   CALCIUM 9.6 04/03/2014 1015   PROT 6.7 04/03/2014 1015   ALBUMIN 4.2 04/03/2014 1015   AST 11 04/03/2014 1015   ALT <8 04/03/2014 1015   ALKPHOS 49 04/03/2014 1015   BILITOT 0.4 04/03/2014 1015   GFRNONAA 69 04/03/2014 1015   GFRNONAA 61*  08/20/2012 0600   GFRAA 79 04/03/2014 1015   GFRAA 71* 08/20/2012 0600    Lab Results  Component Value Date/Time   CHOL 108 09/02/2012 12:51 PM    Lab Results  Component Value Date/Time   HGBA1C 6.20 07/25/2014 11:15 AM   HGBA1C 6.1* 11/27/2013 04:54 PM    Lab Results  Component Value Date/Time   AST 11 04/03/2014 10:15 AM    Assessment and Plan  Other specified diabetes mellitus without complications - Plan:  Results for orders placed or performed in visit on 07/25/14  Glucose (CBG)  Result Value Ref Range   POC Glucose 96.0 70 - 99 mg/dl  HgB A1c  Result Value Ref Range   Hemoglobin A1C 6.20    Diabetes is well controlled continue with current meds  COMPLETE METABOLIC PANEL WITH GFR  Paroxysmal  atrial fibrillation - Plan: diltiazem (CARDIZEM CD) 180 MG 24 hr capsule, isosorbide mononitrate (IMDUR) 60 MG 24 hr tablet  Essential hypertension - Plan:blood pressure is well-controlled continued current medications.  Gastroesophageal reflux disease with esophagitis - Plan: history modification, continue with  pantoprazole (PROTONIX) 40 MG tablet  Cutaneous wart Will be scheduled for cryotherapy    Return in about 3 months (around 10/25/2014), or if symptoms worsen or fail to improve, for Schedule Appt with Dr Adrian Blackwater for Wart cryotherapy .   This note has been created with Surveyor, quantity. Any transcriptional errors are unintentional.    Lorayne Marek, MD

## 2014-07-26 ENCOUNTER — Other Ambulatory Visit: Payer: Self-pay | Admitting: Internal Medicine

## 2014-07-26 ENCOUNTER — Telehealth: Payer: Self-pay

## 2014-07-26 NOTE — Telephone Encounter (Signed)
-----   Message from Lorayne Marek, MD sent at 07/26/2014 10:15 AM EDT ----- Call and let the Patient know that blood work is normal except for borderline elevated sugar level.

## 2014-07-26 NOTE — Telephone Encounter (Signed)
Patient is aware of his lab results 

## 2014-08-01 NOTE — Telephone Encounter (Signed)
Patient called and left message for nurse to call back. Nurse called patient, patient verified date of birth. Patient aware of normal labs except borderline elevated sugar level.  Patient voices understanding and has no further questions at this time.

## 2014-08-01 NOTE — Telephone Encounter (Signed)
Pt states he was not able to hear full results because his TV's volume was loud, please call back with results.

## 2014-08-03 ENCOUNTER — Other Ambulatory Visit: Payer: Self-pay | Admitting: Internal Medicine

## 2014-08-14 ENCOUNTER — Other Ambulatory Visit: Payer: Self-pay | Admitting: Internal Medicine

## 2014-08-28 ENCOUNTER — Other Ambulatory Visit: Payer: Self-pay

## 2014-08-28 MED ORDER — ALLOPURINOL 100 MG PO TABS: 100.0000 mg | ORAL_TABLET | Freq: Every day | ORAL | Status: DC

## 2014-09-19 ENCOUNTER — Telehealth: Payer: Self-pay | Admitting: Internal Medicine

## 2014-09-19 NOTE — Telephone Encounter (Signed)
Patient called requesting to speak to nurse regarding medication lisinopril (PRINIVIL,ZESTRIL) 20 MG tablet, patient states insurance did not approve medication. Please f/u with patient

## 2014-10-23 ENCOUNTER — Telehealth: Payer: Self-pay | Admitting: Internal Medicine

## 2014-10-23 NOTE — Telephone Encounter (Signed)
Patient called stating that his med on carvedilol (COREG) 25 MG tablet, and lisinopril (PRINIVIL,ZESTRIL) 20 MG tablet, have not been approved for three months.  Patient stated he needs this medication for three months. Patient also stated he has been calling and no one has returned his call. Please f/u with pt.

## 2014-10-29 ENCOUNTER — Telehealth: Payer: Self-pay

## 2014-10-29 DIAGNOSIS — I10 Essential (primary) hypertension: Secondary | ICD-10-CM

## 2014-10-29 MED ORDER — CARVEDILOL 25 MG PO TABS: 25.0000 mg | ORAL_TABLET | Freq: Two times a day (BID) | ORAL | Status: DC

## 2014-10-29 MED ORDER — LISINOPRIL 20 MG PO TABS: 20.0000 mg | ORAL_TABLET | Freq: Two times a day (BID) | ORAL | Status: DC

## 2014-10-29 NOTE — Telephone Encounter (Signed)
Returned patient phone call Refilled his coreg and lisinopril Sent to [facility] on file

## 2014-11-27 ENCOUNTER — Other Ambulatory Visit: Payer: Self-pay | Admitting: Internal Medicine

## 2014-12-27 ENCOUNTER — Other Ambulatory Visit: Payer: Self-pay | Admitting: Internal Medicine

## 2015-01-15 ENCOUNTER — Other Ambulatory Visit: Payer: Self-pay | Admitting: Internal Medicine

## 2015-01-19 ENCOUNTER — Other Ambulatory Visit: Payer: Self-pay | Admitting: Internal Medicine

## 2015-01-21 ENCOUNTER — Other Ambulatory Visit: Payer: Self-pay | Admitting: Internal Medicine

## 2015-01-22 ENCOUNTER — Other Ambulatory Visit: Payer: Self-pay | Admitting: Internal Medicine

## 2015-01-23 ENCOUNTER — Telehealth: Payer: Self-pay | Admitting: *Deleted

## 2015-01-23 NOTE — Telephone Encounter (Signed)
Pt notified Rx Metformin refills  Need OV for future refills  Advised to maintain appointment on 02/08/2015

## 2015-02-07 ENCOUNTER — Other Ambulatory Visit: Payer: Self-pay | Admitting: Family Medicine

## 2015-02-07 ENCOUNTER — Other Ambulatory Visit: Payer: Self-pay | Admitting: Internal Medicine

## 2015-02-08 ENCOUNTER — Encounter: Payer: Self-pay | Admitting: Family Medicine

## 2015-02-08 ENCOUNTER — Other Ambulatory Visit: Payer: Self-pay

## 2015-02-08 ENCOUNTER — Ambulatory Visit: Payer: Medicare HMO | Attending: Family Medicine | Admitting: Family Medicine

## 2015-02-08 VITALS — BP 111/70 | HR 63 | Temp 97.9°F | Resp 16 | Ht 71.0 in | Wt 215.0 lb

## 2015-02-08 DIAGNOSIS — Z23 Encounter for immunization: Secondary | ICD-10-CM

## 2015-02-08 DIAGNOSIS — Z7984 Long term (current) use of oral hypoglycemic drugs: Secondary | ICD-10-CM | POA: Insufficient documentation

## 2015-02-08 DIAGNOSIS — E119 Type 2 diabetes mellitus without complications: Secondary | ICD-10-CM

## 2015-02-08 DIAGNOSIS — K219 Gastro-esophageal reflux disease without esophagitis: Secondary | ICD-10-CM

## 2015-02-08 DIAGNOSIS — I10 Essential (primary) hypertension: Secondary | ICD-10-CM | POA: Diagnosis present

## 2015-02-08 DIAGNOSIS — I2510 Atherosclerotic heart disease of native coronary artery without angina pectoris: Secondary | ICD-10-CM

## 2015-02-08 DIAGNOSIS — I480 Paroxysmal atrial fibrillation: Secondary | ICD-10-CM

## 2015-02-08 DIAGNOSIS — Z7982 Long term (current) use of aspirin: Secondary | ICD-10-CM | POA: Diagnosis not present

## 2015-02-08 DIAGNOSIS — K2100 Gastro-esophageal reflux disease with esophagitis, without bleeding: Secondary | ICD-10-CM

## 2015-02-08 DIAGNOSIS — Z79899 Other long term (current) drug therapy: Secondary | ICD-10-CM | POA: Insufficient documentation

## 2015-02-08 DIAGNOSIS — E118 Type 2 diabetes mellitus with unspecified complications: Secondary | ICD-10-CM | POA: Insufficient documentation

## 2015-02-08 DIAGNOSIS — Z87891 Personal history of nicotine dependence: Secondary | ICD-10-CM | POA: Diagnosis not present

## 2015-02-08 DIAGNOSIS — Z8673 Personal history of transient ischemic attack (TIA), and cerebral infarction without residual deficits: Secondary | ICD-10-CM | POA: Insufficient documentation

## 2015-02-08 DIAGNOSIS — I4891 Unspecified atrial fibrillation: Secondary | ICD-10-CM | POA: Insufficient documentation

## 2015-02-08 DIAGNOSIS — Z0000 Encounter for general adult medical examination without abnormal findings: Secondary | ICD-10-CM

## 2015-02-08 DIAGNOSIS — E785 Hyperlipidemia, unspecified: Secondary | ICD-10-CM

## 2015-02-08 DIAGNOSIS — M1A9XX0 Chronic gout, unspecified, without tophus (tophi): Secondary | ICD-10-CM

## 2015-02-08 DIAGNOSIS — Z1159 Encounter for screening for other viral diseases: Secondary | ICD-10-CM

## 2015-02-08 DIAGNOSIS — Z114 Encounter for screening for human immunodeficiency virus [HIV]: Secondary | ICD-10-CM

## 2015-02-08 HISTORY — DX: Gastro-esophageal reflux disease without esophagitis: K21.9

## 2015-02-08 LAB — COMPLETE METABOLIC PANEL WITH GFR
ALBUMIN: 4 g/dL (ref 3.6–5.1)
ALK PHOS: 44 U/L (ref 40–115)
ALT: 10 U/L (ref 9–46)
AST: 13 U/L (ref 10–35)
BUN: 18 mg/dL (ref 7–25)
CHLORIDE: 102 mmol/L (ref 98–110)
CO2: 24 mmol/L (ref 20–31)
Calcium: 9.6 mg/dL (ref 8.6–10.3)
Creat: 1.3 mg/dL — ABNORMAL HIGH (ref 0.70–1.25)
GFR, EST NON AFRICAN AMERICAN: 59 mL/min — AB (ref >=60)
GFR, Est African American: 69 mL/min (ref >=60)
GLUCOSE: 116 mg/dL — AB (ref 65–99)
POTASSIUM: 4.8 mmol/L (ref 3.5–5.3)
SODIUM: 136 mmol/L (ref 135–146)
Total Bilirubin: 0.4 mg/dL (ref 0.2–1.2)
Total Protein: 6.7 g/dL (ref 6.1–8.1)

## 2015-02-08 LAB — LIPID PANEL
CHOL/HDL RATIO: 2.4 ratio (ref <=5.0)
Cholesterol: 131 mg/dL (ref 125–200)
HDL: 55 mg/dL (ref >=40)
LDL Cholesterol: 62 mg/dL (ref <130)
Triglycerides: 70 mg/dL (ref <150)
VLDL: 14 mg/dL (ref <30)

## 2015-02-08 LAB — URIC ACID: URIC ACID, SERUM: 2.3 mg/dL — AB (ref 4.0–7.8)

## 2015-02-08 LAB — GLUCOSE, POCT (MANUAL RESULT ENTRY): POC Glucose: 138 mg/dl — AB (ref 70–99)

## 2015-02-08 LAB — POCT GLYCOSYLATED HEMOGLOBIN (HGB A1C): HEMOGLOBIN A1C: 6.1

## 2015-02-08 MED ORDER — PRAVASTATIN SODIUM 20 MG PO TABS: 20.0000 mg | ORAL_TABLET | Freq: Every morning | ORAL | Status: DC

## 2015-02-08 MED ORDER — DILTIAZEM HCL ER COATED BEADS 180 MG PO CP24: 360.0000 mg | ORAL_CAPSULE | Freq: Every day | ORAL | Status: DC

## 2015-02-08 MED ORDER — LISINOPRIL 20 MG PO TABS: 20.0000 mg | ORAL_TABLET | Freq: Two times a day (BID) | ORAL | Status: DC

## 2015-02-08 MED ORDER — HYDRALAZINE HCL 50 MG PO TABS: ORAL_TABLET | ORAL | Status: DC

## 2015-02-08 MED ORDER — DOXAZOSIN MESYLATE 2 MG PO TABS: 2.0000 mg | ORAL_TABLET | Freq: Every day | ORAL | Status: DC

## 2015-02-08 MED ORDER — PANTOPRAZOLE SODIUM 40 MG PO TBEC: 40.0000 mg | DELAYED_RELEASE_TABLET | Freq: Every day | ORAL | Status: DC

## 2015-02-08 MED ORDER — CARVEDILOL 25 MG PO TABS: 25.0000 mg | ORAL_TABLET | Freq: Two times a day (BID) | ORAL | Status: DC

## 2015-02-08 MED ORDER — ISOSORBIDE MONONITRATE ER 60 MG PO TB24: 60.0000 mg | ORAL_TABLET | Freq: Two times a day (BID) | ORAL | Status: DC

## 2015-02-08 MED ORDER — SPIRONOLACTONE 25 MG PO TABS: 25.0000 mg | ORAL_TABLET | Freq: Every day | ORAL | Status: DC

## 2015-02-08 MED ORDER — METFORMIN HCL 850 MG PO TABS: 850.0000 mg | ORAL_TABLET | Freq: Two times a day (BID) | ORAL | Status: DC

## 2015-02-08 NOTE — Assessment & Plan Note (Signed)
A: Blood pressure at goal. Meds: compliant  P: I will continue the patient's current medication regimen since her blood pressure is at goal.   BMP  Today

## 2015-02-08 NOTE — Assessment & Plan Note (Signed)
Rate controlled. Compliant with daily. Has hx of stroke but not anticoagulated due to intracerebral bleed.   Continue current regimen Cards referral for periodic med review

## 2015-02-08 NOTE — Assessment & Plan Note (Signed)
Diabetes controlled  Continue current regimen

## 2015-02-08 NOTE — Progress Notes (Signed)
Subjective:  Patient ID: [name], male    DOB: 1955-10-18  Age: 60 y.o. MRN: [id number]  CC: Diabetes and Hypertension   HPI RIEL HIRSCHMAN presents for    1. CHRONIC DIABETES  Disease Monitoring  Blood Sugar Ranges: < 160  Polyuria: no   Visual problems: no   Medication Compliance: yes  Medication Side Effects  Hypoglycemia: no    2. CHRONIC HYPERTENSION  Disease Monitoring  Blood pressure range: not checking   Chest pain: no   Dyspnea: no   Claudication: no   Medication compliance: yes  Medication Side Effects  Lightheadedness: no   Urinary frequency: no   Edema: yes,      3. Request cardiology referral: he has hx of CAD and A fib. No CP or SOB. Trace swelling. Previous cardiologist has left the area. He has hx of stroke. He takes daily Asprin. He is not an anticoagulation candidate secondary to intracerebral bleed.   Social History  Substance Use Topics  . Smoking status: Former Smoker    Types: Cigarettes    Quit date: 12/19/2005  . Smokeless tobacco: Never Used  . Alcohol Use: No     Comment: quit in 2012    Outpatient Prescriptions Prior to Visit  Medication Sig Dispense Refill  . Alcohol Swabs (ALCOHOL PREP) 70 % PADS     . aspirin 81 MG tablet Take 1 tablet (81 mg total) by mouth daily at 12 noon. 30 tablet 3  . ASSURE COMFORT LANCETS 30G MISC     . baclofen (LIORESAL) 10 MG tablet Take 0.5 tablets (5 mg total) by mouth 3 (three) times daily. 90 tablet 1  . Blood Glucose Calibration (CARESENS CONTROL A) SOLN     . Blood Glucose Monitoring Suppl ([place]) DEVI     . CARESENS N GLUCOSE TEST test strip     . carvedilol (COREG) 25 MG tablet Take 1 tablet (25 mg total) by mouth 2 (two) times daily with a meal. 120 tablet 3  . diltiazem (CARDIZEM CD) 180 MG 24 hr capsule TAKE 2 CAPSULES BY MOUTH DAILY AT NOON. 180 capsule 3  . doxazosin (CARDURA) 2 MG tablet TAKE 1 TABLET(2 MG) BY MOUTH AT BEDTIME 30 tablet 0  . glucose  monitoring kit (FREESTYLE) monitoring kit 1 each by Does not apply route 4 (four) times daily - after meals and at bedtime. 1 month Diabetic Testing Supplies for QAC-QHS accuchecks. 1 each 1  . hydrALAZINE (APRESOLINE) 50 MG tablet TAKE 2 AND 1/4 TABLETS TWICE A DAY 135 tablet 3  . isosorbide mononitrate (IMDUR) 60 MG 24 hr tablet Take 1 tablet (60 mg total) by mouth 2 (two) times daily. Must have office visit for refills 60 tablet 0  . Lancet Devices (ADJUSTABLE LANCING DEVICE) MISC     . lisinopril (PRINIVIL,ZESTRIL) 20 MG tablet Take 1 tablet (20 mg total) by mouth 2 (two) times daily. 90 tablet 3  . metFORMIN (GLUCOPHAGE) 850 MG tablet Take 1 tablet (850 mg total) by mouth 2 (two) times daily with a meal. Needs office visit for refills 60 tablet 0  . pantoprazole (PROTONIX) 40 MG tablet Take 1 tablet (40 mg total) by mouth daily at 12 noon. 120 tablet 3  . pravastatin (PRAVACHOL) 20 MG tablet Take 1 tablet (20 mg total) by mouth every morning. 90 tablet 2  . spironolactone (ALDACTONE) 25 MG tablet Take 1 tablet (25 mg total) by mouth daily. Must have office visit for  refills 30 tablet 0  . allopurinol (ZYLOPRIM) 100 MG tablet Take 1 tablet (100 mg total) by mouth daily. (Patient not taking: Reported on 02/08/2015) 90 tablet 2  . FEBUXOSTAT PO Take by mouth. Reported on 02/08/2015    . HYDROcodone-acetaminophen (NORCO) 5-325 MG per tablet Take 1 tablet by mouth every 6 (six) hours as needed. (Patient not taking: Reported on 02/08/2015) 30 tablet 0  . UNABLE TO FIND Power wheelchair seating evaluation  DX. History of stroke and pain in limbs (Patient not taking: Reported on 02/08/2015) 1 each 0   No facility-administered medications prior to visit.    ROS Review of Systems  Constitutional: Negative for fever, chills, fatigue and unexpected weight change.  Eyes: Negative for visual disturbance.  Respiratory: Negative for cough and shortness of breath.   Cardiovascular: Positive for leg swelling.  Negative for chest pain and palpitations.  Gastrointestinal: Negative for nausea, vomiting, abdominal pain, diarrhea, constipation and blood in stool.  Endocrine: Negative for polydipsia, polyphagia and polyuria.  Musculoskeletal: Negative for myalgias, back pain, arthralgias, gait problem and neck pain.  Skin: Negative for rash.  Allergic/Immunologic: Negative for immunocompromised state.  Hematological: Negative for adenopathy. Does not bruise/bleed easily.  Psychiatric/Behavioral: Negative for suicidal ideas, sleep disturbance and dysphoric mood. The patient is not nervous/anxious.     Objective:  BP 111/70 mmHg  Pulse 63  Temp(Src) 97.9 F (36.6 C) (Oral)  Resp 16  Ht 5' 11"  (1.803 m)  Wt 215 lb (97.523 kg)  BMI 30.00 kg/m2  SpO2 98%  BP/Weight 02/08/2015 07/25/2014 2/63/3354  Systolic BP 562 563 893  Diastolic BP 70 86 91  Wt. (Lbs) 215 209.4 -  BMI 30 29.22 -     Physical Exam  Constitutional: He appears well-developed and well-nourished. No distress.  HENT:  Head: Normocephalic and atraumatic.  Neck: Normal range of motion. Neck supple.  Cardiovascular: Normal rate, normal heart sounds and intact distal pulses.  An irregularly irregular rhythm present.  Pulmonary/Chest: Effort normal and breath sounds normal.  Musculoskeletal: He exhibits edema (trace in legs ).  Neurological: He is alert. Gait abnormal.  Skin: Skin is warm and dry. No rash noted. No erythema.  Psychiatric: He has a normal mood and affect.   EKG: atrial fibrillation, rate 62.  Lab Results  Component Value Date   HGBA1C 6.10 02/08/2015   CBG 138 Assessment & Plan:   C[place]rell was seen today for diabetes and hypertension.  Diagnoses and all orders for this visit:  Paroxysmal atrial fibrillation (HCC) -     Lipid Panel -     carvedilol (COREG) 25 MG tablet; Take 1 tablet (25 mg total) by mouth 2 (two) times daily with a meal. -     diltiazem (CARDIZEM CD) 180 MG 24 hr capsule; Take 2 capsules  (360 mg total) by mouth daily. TAKE 2 CAPSULES BY MOUTH DAILY AT NOON.  Healthcare maintenance -     Flu Vaccine QUAD 36+ mos IM  Coronary artery disease involving native coronary artery of native heart without angina pectoris -     Ambulatory referral to Cardiology -     Lipid Panel -     COMPLETE METABOLIC PANEL WITH GFR -     hydrALAZINE (APRESOLINE) 50 MG tablet; TAKE 2 AND 1/4 TABLETS TWICE A DAY -     isosorbide mononitrate (IMDUR) 60 MG 24 hr tablet; Take 1 tablet (60 mg total) by mouth 2 (two) times daily. -     lisinopril (PRINIVIL,ZESTRIL) 20  MG tablet; Take 1 tablet (20 mg total) by mouth 2 (two) times daily. -     spironolactone (ALDACTONE) 25 MG tablet; Take 1 tablet (25 mg total) by mouth daily.  Controlled type 2 diabetes mellitus without complication, without long-term current use of insulin (HCC) -     POCT glucose (manual entry) -     POCT glycosylated hemoglobin (Hb A1C) -     metFORMIN (GLUCOPHAGE) 850 MG tablet; Take 1 tablet (850 mg total) by mouth 2 (two) times daily with a meal. -     Ambulatory referral to Ophthalmology  Essential hypertension -     COMPLETE METABOLIC PANEL WITH GFR -     doxazosin (CARDURA) 2 MG tablet; Take 1 tablet (2 mg total) by mouth daily. -     lisinopril (PRINIVIL,ZESTRIL) 20 MG tablet; Take 1 tablet (20 mg total) by mouth 2 (two) times daily.  Gastroesophageal reflux disease with esophagitis -     pantoprazole (PROTONIX) 40 MG tablet; Take 1 tablet (40 mg total) by mouth daily at 12 noon.  Hyperlipemia -     pravastatin (PRAVACHOL) 20 MG tablet; Take 1 tablet (20 mg total) by mouth every morning.  Chronic gout without tophus, unspecified cause, unspecified site -     Uric Acid  Need for hepatitis C screening test -     Hepatitis C antibody, reflex  Screening for HIV (human immunodeficiency virus) -     HIV antibody (with reflex)     No orders of the defined types were placed in this encounter.    Follow-up: No Follow-up  on file.   Boykin Nearing MD

## 2015-02-08 NOTE — Patient Instructions (Signed)
[name] was seen today for diabetes and hypertension.  Diagnoses and all orders for this visit:  Paroxysmal atrial fibrillation (HCC) -     Lipid Panel -     carvedilol (COREG) 25 MG tablet; Take 1 tablet (25 mg total) by mouth 2 (two) times daily with a meal. -     diltiazem (CARDIZEM CD) 180 MG 24 hr capsule; Take 2 capsules (360 mg total) by mouth daily. TAKE 2 CAPSULES BY MOUTH DAILY AT NOON.  Healthcare maintenance -     Flu Vaccine QUAD 36+ mos IM  Coronary artery disease involving native coronary artery of native heart without angina pectoris -     Ambulatory referral to Cardiology -     Lipid Panel -     COMPLETE METABOLIC PANEL WITH GFR -     hydrALAZINE (APRESOLINE) 50 MG tablet; TAKE 2 AND 1/4 TABLETS TWICE A DAY -     isosorbide mononitrate (IMDUR) 60 MG 24 hr tablet; Take 1 tablet (60 mg total) by mouth 2 (two) times daily. -     lisinopril (PRINIVIL,ZESTRIL) 20 MG tablet; Take 1 tablet (20 mg total) by mouth 2 (two) times daily. -     spironolactone (ALDACTONE) 25 MG tablet; Take 1 tablet (25 mg total) by mouth daily.  Controlled type 2 diabetes mellitus without complication, without long-term current use of insulin (HCC) -     POCT glucose (manual entry) -     POCT glycosylated hemoglobin (Hb A1C) -     metFORMIN (GLUCOPHAGE) 850 MG tablet; Take 1 tablet (850 mg total) by mouth 2 (two) times daily with a meal.  Essential hypertension -     COMPLETE METABOLIC PANEL WITH GFR -     doxazosin (CARDURA) 2 MG tablet; Take 1 tablet (2 mg total) by mouth daily. -     lisinopril (PRINIVIL,ZESTRIL) 20 MG tablet; Take 1 tablet (20 mg total) by mouth 2 (two) times daily.  Gastroesophageal reflux disease with esophagitis -     pantoprazole (PROTONIX) 40 MG tablet; Take 1 tablet (40 mg total) by mouth daily at 12 noon.  Hyperlipemia -     pravastatin (PRAVACHOL) 20 MG tablet; Take 1 tablet (20 mg total) by mouth every morning.    Labs will be sent to your mychart You will be  called with cardiology appt  F/u with me in 3 months   Dr. Adrian Blackwater

## 2015-02-08 NOTE — Progress Notes (Signed)
F/U DM HTN  Referral Cardiology  No tobacco user  No pain today  No suicidal thought in the past two weeks

## 2015-02-09 LAB — HIV ANTIBODY (ROUTINE TESTING W REFLEX): HIV: NONREACTIVE

## 2015-02-09 LAB — HEPATITIS C ANTIBODY: HCV AB: NEGATIVE

## 2015-02-13 ENCOUNTER — Encounter: Payer: Self-pay | Admitting: Cardiovascular Disease

## 2015-02-20 ENCOUNTER — Other Ambulatory Visit: Payer: Self-pay | Admitting: Internal Medicine

## 2015-02-20 ENCOUNTER — Telehealth: Payer: Self-pay | Admitting: Family Medicine

## 2015-02-20 NOTE — Telephone Encounter (Signed)
Patient is requesting a letter stating medical conditions to be excused from [employer]. Please follow up with patient.

## 2015-02-21 NOTE — Telephone Encounter (Signed)
Letter written and ready for pick up Please inform patient  

## 2015-02-22 ENCOUNTER — Ambulatory Visit (INDEPENDENT_AMBULATORY_CARE_PROVIDER_SITE_OTHER): Payer: Medicare HMO | Admitting: Cardiovascular Disease

## 2015-02-22 ENCOUNTER — Encounter: Payer: Self-pay | Admitting: Cardiovascular Disease

## 2015-02-22 VITALS — BP 90/66 | HR 58 | Ht 71.0 in | Wt 217.8 lb

## 2015-02-22 DIAGNOSIS — I2510 Atherosclerotic heart disease of native coronary artery without angina pectoris: Secondary | ICD-10-CM

## 2015-02-22 MED ORDER — ISOSORBIDE MONONITRATE ER 60 MG PO TB24: 60.0000 mg | ORAL_TABLET | Freq: Every day | ORAL | Status: DC

## 2015-02-22 NOTE — Progress Notes (Signed)
Cardiology Office Note   Date:  02/22/2015   ID:  [name], [name] [birth year], MRN [id number]  PCP:  Minerva Ends, MD  Cardiologist:   Thayer Headings, MD  Former patient of Dr. Verl Blalock   Chief Complaint  Patient presents with  . Atrial Fibrillation   Problem List 1. Atrial fib   2. Chronic diastolic CHF 3. Essential HTN 4. Intracranial hemorrhage ( on coumadin )  5. DM 6. COPD    History of Present Illness: [name] is a 60 y.o. male who presents for further evaluation of paroxysmal atrial fibrillation.  He was last seen by Dr. Verl Blalock in Dec. 2014.  No CP recently, no dyspnea  Has better fine motor control in the left side of his body    Able to walk with a walker,  Was seen today in a wheelchair.   Past Medical History  Diagnosis Date  . Diabetes mellitus   . Coronary artery disease   . Hypertension   . Gout   . CHF (congestive heart failure) ([place])   . Arthritis   . Hypertensive emergency 11/19/2010  . Pulmonary edema 11/19/2010  . Acute exacerbation of congestive heart failure ([place]) 11/19/2010  . Thyroiditis 11/20/2010  . ICH (intracerebral hemorrhage) ([place]) 12/22/2010  . Stroke ([place]) 11/19/2010  . Respiratory failure ([place]) 11/19/2010  . CAD (coronary artery disease) 11/20/2010  . Physical deconditioning 12/22/2010  . Diabetes mellitus 11/19/2010  . A-fib ([place]) 11/19/2010  . Hyperlipemia 11/21/2010  . Shortness of breath     Past Surgical History  Procedure Laterality Date  . Tracheostomy tube placement  11/28/2010    Procedure: TRACHEOSTOMY;  Surgeon: Beckie Salts, MD;  Location: [facility];  Service: ENT;  Laterality: N/A;  . Peg placement  12/03/2010    Procedure: PERCUTANEOUS ENDOSCOPIC GASTROSTOMY (PEG) PLACEMENT;  Surgeon: Lafayette Dragon, MD;  [facility] ENDOSCOPY;  Service: Endoscopy;  Laterality: N/A;     Current Outpatient Prescriptions  Medication Sig Dispense Refill  . Alcohol Swabs (ALCOHOL PREP) 70 % PADS     . aspirin 81 MG tablet  Take 1 tablet (81 mg total) by mouth daily at 12 noon. 30 tablet 3  . ASSURE COMFORT LANCETS 30G MISC     . baclofen (LIORESAL) 10 MG tablet Take 0.5 tablets (5 mg total) by mouth 3 (three) times daily. 90 tablet 1  . Blood Glucose Calibration (CARESENS CONTROL A) SOLN     . Blood Glucose Monitoring Suppl ([place]) DEVI     . CARESENS N GLUCOSE TEST test strip     . carvedilol (COREG) 25 MG tablet Take 1 tablet (25 mg total) by mouth 2 (two) times daily with a meal. 180 tablet 3  . diltiazem (CARDIZEM CD) 180 MG 24 hr capsule Take 2 capsules (360 mg total) by mouth daily. TAKE 2 CAPSULES BY MOUTH DAILY AT NOON. 180 capsule 3  . doxazosin (CARDURA) 2 MG tablet Take 1 tablet (2 mg total) by mouth daily. 90 tablet 3  . FEBUXOSTAT PO Take by mouth. Reported on 02/08/2015    . glucose monitoring kit (FREESTYLE) monitoring kit 1 each by Does not apply route 4 (four) times daily - after meals and at bedtime. 1 month Diabetic Testing Supplies for QAC-QHS accuchecks. 1 each 1  . hydrALAZINE (APRESOLINE) 50 MG tablet TAKE 2 AND 1/4 TABLETS TWICE A DAY 405 tablet 3  . isosorbide mononitrate (IMDUR) 60 MG 24 hr tablet Take 1  tablet (60 mg total) by mouth 2 (two) times daily. 180 tablet 3  . Lancet Devices (ADJUSTABLE LANCING DEVICE) MISC     . lisinopril (PRINIVIL,ZESTRIL) 20 MG tablet Take 1 tablet (20 mg total) by mouth 2 (two) times daily. 90 tablet 3  . metFORMIN (GLUCOPHAGE) 850 MG tablet Take 1 tablet (850 mg total) by mouth 2 (two) times daily with a meal. 180 tablet 3  . pantoprazole (PROTONIX) 40 MG tablet Take 1 tablet (40 mg total) by mouth daily at 12 noon. 90 tablet 3  . pravastatin (PRAVACHOL) 20 MG tablet Take 1 tablet (20 mg total) by mouth every morning. 90 tablet 3  . spironolactone (ALDACTONE) 25 MG tablet Take 1 tablet (25 mg total) by mouth daily. 90 tablet 3  . [DISCONTINUED] insulin glargine (LANTUS) 100 UNIT/ML injection Inject 0.15 mLs (15 Units total) into the skin  at bedtime. 12 mL 1  . [DISCONTINUED] potassium chloride (KLOR-CON M15) 15 MEQ tablet Take 2 tablets (30 mEq total) by mouth 2 (two) times daily. 120 tablet 1  . [DISCONTINUED] rosuvastatin (CRESTOR) 5 MG tablet Place 1 tablet (5 mg total) into feeding tube daily at 6 PM. 30 tablet 2   No current facility-administered medications for this visit.    Allergies:   Review of patient's allergies indicates no known allergies.    Social History:  The patient  reports that he quit smoking about 9 years ago. His smoking use included Cigarettes. He has never used smokeless tobacco. He reports that he does not drink alcohol or use illicit drugs.   Family History:  The patient's family history includes Diabetes in his brother and mother. There is no history of Colon cancer, Colon polyps, Kidney disease, Esophageal cancer, Heart disease, or Gallbladder disease.    ROS:  Please see the history of present illness.    Review of Systems: Constitutional:  denies fever, chills, diaphoresis, appetite change and fatigue.  HEENT: denies photophobia, eye pain, redness, hearing loss, ear pain, congestion, sore throat, rhinorrhea, sneezing, neck pain, neck stiffness and tinnitus.  Respiratory: denies SOB, DOE, cough, chest tightness, and wheezing.  Cardiovascular: denies chest pain, palpitations and leg swelling.  Gastrointestinal: denies nausea, vomiting, abdominal pain, diarrhea, constipation, blood in stool.  Genitourinary: denies dysuria, urgency, frequency, hematuria, flank pain and difficulty urinating.  Musculoskeletal: denies  myalgias, back pain, joint swelling, arthralgias and gait problem.   Skin: denies pallor, rash and wound.  Neurological: denies dizziness, seizures, syncope, weakness, light-headedness, numbness and headaches.   Hematological: denies adenopathy, easy bruising, personal or family bleeding history.  Psychiatric/ Behavioral: denies suicidal ideation, mood changes, confusion,  nervousness, sleep disturbance and agitation.       All other systems are reviewed and negative.    PHYSICAL EXAM: VS:  BP 90/66 mmHg  Pulse 58  Ht 5' 11"  (1.803 m)  Wt 217 lb 12.8 oz (98.793 kg)  BMI 30.39 kg/m2 , BMI Body mass index is 30.39 kg/(m^2). GEN: Well nourished, well developed, in no acute distress HEENT: normal Neck: no JVD, carotid bruits, or masses Cardiac: Irrg. Irreg. ; no murmurs, rubs, or gallops,no edema  Respiratory:  clear to auscultation bilaterally, normal work of breathing GI: soft, nontender, nondistended, + BS MS: no deformity or atrophy Skin: warm and dry, no rash Neuro:  Strength and sensation are intact Psych: normal   EKG:  EKG is not ordered today. The ekg ordered 02/08/15  demonstrates atrial fib with rate of 62.   TWI in the lateral leads.  Recent Labs: 02/08/2015: ALT 10; BUN 18; Creat 1.30*; Potassium 4.8; Sodium 136    Lipid Panel    Component Value Date/Time   CHOL 131 02/08/2015 1223   TRIG 70 02/08/2015 1223   HDL 55 02/08/2015 1223   CHOLHDL 2.4 02/08/2015 1223   VLDL 14 02/08/2015 1223   LDLCALC 62 02/08/2015 1223      Wt Readings from Last 3 Encounters:  02/22/15 217 lb 12.8 oz (98.793 kg)  02/08/15 215 lb (97.523 kg)  07/25/14 209 lb 6.4 oz (94.983 kg)      Other studies Reviewed: Additional studies/ records that were reviewed today include: . Review of the above records demonstrates:    ASSESSMENT AND PLAN:  1. Atrial fib - he is in chronic atrial fibrillation. He's currently not on anticoagulation but is on aspirin once a day. He's had a history of an intracranial bleed.  He would benefit from being on Eliquis but his previous doctors have thougt that he was at high risk for recurrent intracranial bleed.  Will defer that decision to his primary medical doctor  I'll see him again in one year for follow-up visit.    2. Chronic diastolic CHF - stable  3. Essential HTN - actually a bit low.   Will decrese  Imdur to 60 mg a day . He is not having any angina.  This should help bring his blood pressure up a little bit.  4. Intracranial hemorrhage ( on coumadin )  5. DM 6. COPD    Current medicines are reviewed at length with the patient today.  The patient does not have concerns regarding medicines.  The following changes have been made:  no change  Labs/ tests ordered today include:  No orders of the defined types were placed in this encounter.     Disposition:   FU with me in 1 year.      Abdurrahman Petersheim, Wonda Cheng, MD  02/22/2015 11:46 AM    [address], [place], [place]  [address] Phone: [phone]; Fax: [phone]   [facility]  [address] [place], [place]  [address] [phone]   Fax [phone]

## 2015-02-22 NOTE — Patient Instructions (Signed)
Medication Instructions:  DECREASE Imdur to 60 mg once daily   Labwork: None Ordered   Testing/Procedures: None Ordered   Follow-Up: Your physician wants you to follow-up in: 1 year with Dr. Acie Fredrickson.  You will receive a reminder letter in the mail two months in advance. If you don't receive a letter, please call our office to schedule the follow-up appointment.  If you need a refill on your cardiac medications before your next appointment, please call your pharmacy.   Thank you for choosing [facility]! Christen Bame, RN [phone]

## 2015-02-22 NOTE — Telephone Encounter (Signed)
LVM letter at front office ready to be pick up

## 2015-03-08 ENCOUNTER — Other Ambulatory Visit: Payer: Self-pay | Admitting: Family Medicine

## 2015-06-13 ENCOUNTER — Ambulatory Visit: Payer: Medicare Other | Attending: Family Medicine | Admitting: Family Medicine

## 2015-06-13 ENCOUNTER — Encounter: Payer: Self-pay | Admitting: Family Medicine

## 2015-06-13 VITALS — BP 104/70 | HR 65 | Temp 98.8°F | Resp 16 | Ht 71.0 in | Wt 218.0 lb

## 2015-06-13 DIAGNOSIS — Z7982 Long term (current) use of aspirin: Secondary | ICD-10-CM | POA: Insufficient documentation

## 2015-06-13 DIAGNOSIS — Z0000 Encounter for general adult medical examination without abnormal findings: Secondary | ICD-10-CM | POA: Diagnosis not present

## 2015-06-13 DIAGNOSIS — Z79899 Other long term (current) drug therapy: Secondary | ICD-10-CM | POA: Insufficient documentation

## 2015-06-13 DIAGNOSIS — E119 Type 2 diabetes mellitus without complications: Secondary | ICD-10-CM

## 2015-06-13 DIAGNOSIS — Z87891 Personal history of nicotine dependence: Secondary | ICD-10-CM | POA: Insufficient documentation

## 2015-06-13 DIAGNOSIS — Z7984 Long term (current) use of oral hypoglycemic drugs: Secondary | ICD-10-CM | POA: Insufficient documentation

## 2015-06-13 DIAGNOSIS — M1A9XX0 Chronic gout, unspecified, without tophus (tophi): Secondary | ICD-10-CM | POA: Diagnosis not present

## 2015-06-13 DIAGNOSIS — I10 Essential (primary) hypertension: Secondary | ICD-10-CM | POA: Diagnosis not present

## 2015-06-13 LAB — GLUCOSE, POCT (MANUAL RESULT ENTRY): POC GLUCOSE: 127 mg/dL — AB (ref 70–99)

## 2015-06-13 LAB — BASIC METABOLIC PANEL WITH GFR
BUN: 21 mg/dL (ref 7–25)
CALCIUM: 9.7 mg/dL (ref 8.6–10.3)
CO2: 25 mmol/L (ref 20–31)
Chloride: 101 mmol/L (ref 98–110)
Creat: 1.27 mg/dL — ABNORMAL HIGH (ref 0.70–1.25)
GFR, EST AFRICAN AMERICAN: 71 mL/min (ref >=60)
GFR, EST NON AFRICAN AMERICAN: 61 mL/min (ref >=60)
Glucose, Bld: 93 mg/dL (ref 65–99)
Potassium: 4.8 mmol/L (ref 3.5–5.3)
SODIUM: 135 mmol/L (ref 135–146)

## 2015-06-13 LAB — POCT GLYCOSYLATED HEMOGLOBIN (HGB A1C): HEMOGLOBIN A1C: 6.3

## 2015-06-13 MED ORDER — ZOSTER VACCINE LIVE 19400 UNT/0.65ML ~~LOC~~ SUSR: 0.6500 mL | Freq: Once | SUBCUTANEOUS | Status: DC

## 2015-06-13 NOTE — Progress Notes (Signed)
F/U DM HTN  Glucose running 99-124 Taking medication as prescribed  No pain today today  No tobacco user  No suicidal thoughts in the past two weeks

## 2015-06-13 NOTE — Assessment & Plan Note (Signed)
Low uric acid  No need for allopurinol

## 2015-06-13 NOTE — Assessment & Plan Note (Signed)
Controlled.  

## 2015-06-13 NOTE — Progress Notes (Signed)
Subjective:  Patient ID: [name], male    DOB: [birth year]  Age: 60 y.o. MRN: [id number]  CC: Hypertension and Diabetes   HPI [name] has hx of CVA with residual weakness he  presents for    1. CHRONIC DIABETES  Disease Monitoring  Blood Sugar Ranges: 99-124  Polyuria: no   Visual problems: no   Medication Compliance: yes  Medication Side Effects  Hypoglycemia: no   Preventitive Health Care  Eye Exam: has an appt next week   Foot Exam: done today     2. CHRONIC HYPERTENSION  Disease Monitoring  Blood pressure range: not checking   Chest pain: no   Dyspnea: no   Claudication: no   Medication compliance: yes  Medication Side Effects  Lightheadedness: no   Urinary frequency: no   Edema: yes     3. transportation difficulties: unable to drive since he stroke. He obtains rides from a friend for appointments. He lives alone.   Social History  Substance Use Topics  . Smoking status: Former Smoker    Types: Cigarettes    Quit date: 12/19/2005  . Smokeless tobacco: Never Used  . Alcohol Use: No     Comment: quit in 2012    Outpatient Prescriptions Prior to Visit  Medication Sig Dispense Refill  . Alcohol Swabs (ALCOHOL PREP) 70 % PADS     . aspirin 81 MG tablet Take 1 tablet (81 mg total) by mouth daily at 12 noon. 30 tablet 3  . ASSURE COMFORT LANCETS 30G MISC     . Blood Glucose Calibration (CARESENS CONTROL A) SOLN     . Blood Glucose Monitoring Suppl ([place]) DEVI     . CARESENS N GLUCOSE TEST test strip     . carvedilol (COREG) 25 MG tablet Take 1 tablet (25 mg total) by mouth 2 (two) times daily with a meal. 180 tablet 3  . diltiazem (CARDIZEM CD) 180 MG 24 hr capsule Take 2 capsules (360 mg total) by mouth daily. TAKE 2 CAPSULES BY MOUTH DAILY AT NOON. 180 capsule 3  . doxazosin (CARDURA) 2 MG tablet Take 1 tablet (2 mg total) by mouth daily. 90 tablet 3  . FEBUXOSTAT PO Take by mouth. Reported on 02/08/2015    . glucose  monitoring kit (FREESTYLE) monitoring kit 1 each by Does not apply route 4 (four) times daily - after meals and at bedtime. 1 month Diabetic Testing Supplies for QAC-QHS accuchecks. 1 each 1  . hydrALAZINE (APRESOLINE) 50 MG tablet TAKE 2 AND 1/4 TABLETS TWICE A DAY 405 tablet 3  . isosorbide mononitrate (IMDUR) 60 MG 24 hr tablet Take 1 tablet (60 mg total) by mouth daily. 180 tablet 3  . Lancet Devices (ADJUSTABLE LANCING DEVICE) MISC     . lisinopril (PRINIVIL,ZESTRIL) 20 MG tablet Take 1 tablet (20 mg total) by mouth 2 (two) times daily. 90 tablet 3  . metFORMIN (GLUCOPHAGE) 850 MG tablet Take 1 tablet (850 mg total) by mouth 2 (two) times daily with a meal. 180 tablet 3  . pantoprazole (PROTONIX) 40 MG tablet Take 1 tablet (40 mg total) by mouth daily at 12 noon. 90 tablet 3  . pravastatin (PRAVACHOL) 20 MG tablet Take 1 tablet (20 mg total) by mouth every morning. 90 tablet 3  . spironolactone (ALDACTONE) 25 MG tablet Take 1 tablet (25 mg total) by mouth daily. 90 tablet 3  . baclofen (LIORESAL) 10 MG tablet Take 0.5 tablets (5 mg  total) by mouth 3 (three) times daily. (Patient not taking: Reported on 06/13/2015) 90 tablet 1   No facility-administered medications prior to visit.    ROS Review of Systems  Constitutional: Negative for fever, chills, fatigue and unexpected weight change.  Eyes: Negative for visual disturbance.  Respiratory: Negative for cough and shortness of breath.   Cardiovascular: Negative for chest pain, palpitations and leg swelling.  Gastrointestinal: Negative for nausea, vomiting, abdominal pain, diarrhea, constipation and blood in stool.  Endocrine: Negative for polydipsia, polyphagia and polyuria.  Musculoskeletal: Negative for myalgias, back pain, arthralgias, gait problem and neck pain.  Skin: Negative for rash.  Allergic/Immunologic: Negative for immunocompromised state.  Hematological: Negative for adenopathy. Does not bruise/bleed easily.    Psychiatric/Behavioral: Negative for suicidal ideas, sleep disturbance and dysphoric mood. The patient is not nervous/anxious.     Objective:  BP 104/70 mmHg  Pulse 65  Temp(Src) 98.8 F (37.1 C) (Oral)  Resp 16  Ht 5' 11"  (1.803 m)  Wt 218 lb (98.884 kg)  BMI 30.42 kg/m2  SpO2 98%  BP/Weight 06/13/2015 02/22/2015 0/01/7492  Systolic BP 496 90 759  Diastolic BP 70 66 70  Wt. (Lbs) 218 217.8 215  BMI 30.42 30.39 30   Physical Exam  Constitutional: He appears well-developed and well-nourished. No distress.  HENT:  Head: Normocephalic and atraumatic.  Neck: Normal range of motion. Neck supple.  Cardiovascular: Normal rate, normal heart sounds and intact distal pulses.  An irregularly irregular rhythm present.  Pulmonary/Chest: Effort normal and breath sounds normal.  Musculoskeletal: He exhibits edema (trace in legs ).  Neurological: He is alert. Gait (he walks with a walker ) abnormal.  Skin: Skin is warm and dry. No rash noted. No erythema.  Psychiatric: He has a normal mood and affect.   Lab Results  Component Value Date   HGBA1C 6.10 02/08/2015   Lab Results  Component Value Date   HGBA1C 6.3 06/13/2015    CBG 127  Assessment & Plan:   Dexton was seen today for hypertension and diabetes.  Diagnoses and all orders for this visit:  Controlled type 2 diabetes mellitus without complication, without long-term current use of insulin (HCC) -     POCT glycosylated hemoglobin (Hb A1C) -     POCT glucose (manual entry) -     Microalbumin/Creatinine Ratio, Urine  Essential hypertension -     BASIC METABOLIC PANEL WITH GFR  Healthcare maintenance -     Zoster Vaccine Live, PF, (ZOSTAVAX) 16384 UNT/0.65ML injection; Inject 19,400 Units into the skin once.    No orders of the defined types were placed in this encounter.    Follow-up: No Follow-up on file.   Barry Nearing MD

## 2015-06-13 NOTE — Patient Instructions (Addendum)
[name] was seen today for hypertension and diabetes.  Diagnoses and all orders for this visit:  Controlled type 2 diabetes mellitus without complication, without long-term current use of insulin (HCC) -     POCT glycosylated hemoglobin (Hb A1C) -     POCT glucose (manual entry) -     Microalbumin/Creatinine Ratio, Urine  Essential hypertension -     BASIC METABOLIC PANEL WITH GFR  Healthcare maintenance -     Zoster Vaccine Live, PF, (ZOSTAVAX) 40981 UNT/0.65ML injection; Inject 19,400 Units into the skin once.  Chronic gout without tophus, unspecified cause, unspecified site    F/u in 6 months for HTN and diabetes  Dr. Adrian Blackwater

## 2015-06-13 NOTE — Assessment & Plan Note (Signed)
SCAT application initiated for transportation assistance

## 2015-06-13 NOTE — Assessment & Plan Note (Signed)
Remains well controlled Continue current regimen  

## 2015-06-14 LAB — MICROALBUMIN / CREATININE URINE RATIO
Creatinine, Urine: 135 mg/dL (ref 20–370)
MICROALB/CREAT RATIO: 3 ug/mg{creat} (ref <30)
Microalb, Ur: 0.4 mg/dL

## 2015-06-17 ENCOUNTER — Telehealth: Payer: Self-pay | Admitting: *Deleted

## 2015-06-17 NOTE — Telephone Encounter (Signed)
-----   Message from Boykin Nearing, MD sent at 06/14/2015  8:35 AM EDT ----- Stable Cr Negative urine microalbumin  Continue current treatment plan

## 2015-06-17 NOTE — Telephone Encounter (Signed)
Date of birth verified by pt  Results given  Continue current Tx plan Pt verbalized understanding

## 2015-06-19 ENCOUNTER — Telehealth: Payer: Self-pay | Admitting: Family Medicine

## 2015-06-19 NOTE — Telephone Encounter (Signed)
Patient dropped off Part A of SCAT form

## 2015-06-21 ENCOUNTER — Encounter: Payer: Self-pay | Admitting: Podiatry

## 2015-06-21 ENCOUNTER — Ambulatory Visit (INDEPENDENT_AMBULATORY_CARE_PROVIDER_SITE_OTHER): Payer: Medicare Other | Admitting: Podiatry

## 2015-06-21 DIAGNOSIS — B351 Tinea unguium: Secondary | ICD-10-CM

## 2015-06-21 DIAGNOSIS — M79673 Pain in unspecified foot: Secondary | ICD-10-CM

## 2015-06-21 NOTE — Progress Notes (Signed)
Patient ID: [name], male   DOB: 05/04/1955, 60 y.o.   MRN: [id number]  Subjective: 60 y.o.-year-old male returns the office today for painful, elongated, thickened toenails for which he cannot cut himself. Denies any redness or drainage around the nails. Denies any acute changes since last appointment and no new complaints today. Denies any systemic complaints such as fevers, chills, nausea, vomiting.   Objective: AAO 3, NAD DP/PT pulses palpable 1/4, CRT less than 3 seconds Protective sensation intact with Simms Weinstein monofilament, Achilles tendon reflex intact.  Nails hypertrophic, dystrophic, elongated, brittle, discolored 10. There is tenderness overlying the nails 1-5 bilaterally. There is no surrounding erythema or drainage along the nail sites. No open lesions or pre-ulcerative lesions are identified. No other areas of tenderness bilateral lower extremities. No overlying edema, erythema, increased warmth. No pain with calf compression, swelling, warmth, erythema.  Assessment: Patient presents with symptomatic onychomycosis  Plan: -Treatment options including alternatives, risks, complications were discussed -Nails sharply debrided 10 without complication/bleeding. -Discussed daily foot inspection. If there are any changes, to call the office immediately.  -Follow-up in 3 months or sooner if any problems are to arise. In the meantime, encouraged to call the office with any questions, concerns, changes symptoms.  Celesta Gentile, DPM

## 2015-06-24 DIAGNOSIS — E119 Type 2 diabetes mellitus without complications: Secondary | ICD-10-CM | POA: Diagnosis not present

## 2015-06-24 DIAGNOSIS — H2513 Age-related nuclear cataract, bilateral: Secondary | ICD-10-CM | POA: Diagnosis not present

## 2015-06-24 LAB — HM DIABETES EYE EXAM

## 2015-06-24 NOTE — Telephone Encounter (Signed)
SCAT form B also completed and signed today. Form A and Form B will be mailed together to [employer] at: [place], [facility], [address], [place], [place] [address].  Patient will need to call [phone] to schedule an in person interview, please patient to let him know.

## 2015-06-25 NOTE — Telephone Encounter (Signed)
Pt notified [employer] form mail yesterday Need to call Public transportation Division at [phone] to schedule and in person interview  Pt verbalized understanding

## 2015-07-01 ENCOUNTER — Other Ambulatory Visit: Payer: Self-pay | Admitting: Internal Medicine

## 2015-07-18 ENCOUNTER — Telehealth: Payer: Self-pay | Admitting: Family Medicine

## 2015-07-18 NOTE — Telephone Encounter (Signed)
Pt called requesting information on receiving a handicapp placard Pt was informed that we have forms in the front office that he can fill out and then have his PCP fill out Pt requested to talk to the nurse for more information

## 2015-07-19 NOTE — Telephone Encounter (Signed)
Contacted pt lvm informing pt that we do have forms for handicapp placards and I will be giving the form to Dr. Adrian Blackwater to fill out

## 2015-07-22 ENCOUNTER — Other Ambulatory Visit: Payer: Self-pay | Admitting: Pharmacist

## 2015-07-22 DIAGNOSIS — I10 Essential (primary) hypertension: Secondary | ICD-10-CM

## 2015-07-22 DIAGNOSIS — I2510 Atherosclerotic heart disease of native coronary artery without angina pectoris: Secondary | ICD-10-CM

## 2015-07-22 MED ORDER — LISINOPRIL 20 MG PO TABS: 20.0000 mg | ORAL_TABLET | Freq: Two times a day (BID) | ORAL | Status: DC

## 2015-07-22 NOTE — Telephone Encounter (Signed)
Contacted patient and made him aware of his placard form is ready for pickup

## 2015-07-22 NOTE — Telephone Encounter (Signed)
Please inform patient that parking placard form is ready for pick up

## 2015-08-16 ENCOUNTER — Other Ambulatory Visit: Payer: Self-pay | Admitting: Pharmacist

## 2015-08-16 DIAGNOSIS — K2100 Gastro-esophageal reflux disease with esophagitis, without bleeding: Secondary | ICD-10-CM

## 2015-08-16 DIAGNOSIS — E785 Hyperlipidemia, unspecified: Secondary | ICD-10-CM

## 2015-08-16 DIAGNOSIS — E119 Type 2 diabetes mellitus without complications: Secondary | ICD-10-CM

## 2015-08-16 DIAGNOSIS — I480 Paroxysmal atrial fibrillation: Secondary | ICD-10-CM

## 2015-08-16 DIAGNOSIS — I2510 Atherosclerotic heart disease of native coronary artery without angina pectoris: Secondary | ICD-10-CM

## 2015-08-16 DIAGNOSIS — I10 Essential (primary) hypertension: Secondary | ICD-10-CM

## 2015-08-16 MED ORDER — CARVEDILOL 25 MG PO TABS: 25.0000 mg | ORAL_TABLET | Freq: Two times a day (BID) | ORAL | 0 refills | Status: DC

## 2015-08-16 MED ORDER — PANTOPRAZOLE SODIUM 40 MG PO TBEC: 40.0000 mg | DELAYED_RELEASE_TABLET | Freq: Every day | ORAL | 0 refills | Status: DC

## 2015-08-16 MED ORDER — HYDRALAZINE HCL 50 MG PO TABS: ORAL_TABLET | ORAL | 0 refills | Status: DC

## 2015-08-16 MED ORDER — DILTIAZEM HCL ER COATED BEADS 180 MG PO CP24: 360.0000 mg | ORAL_CAPSULE | Freq: Every day | ORAL | 0 refills | Status: DC

## 2015-08-16 MED ORDER — DOXAZOSIN MESYLATE 2 MG PO TABS: 2.0000 mg | ORAL_TABLET | Freq: Every day | ORAL | 0 refills | Status: DC

## 2015-08-16 MED ORDER — METFORMIN HCL 850 MG PO TABS: 850.0000 mg | ORAL_TABLET | Freq: Two times a day (BID) | ORAL | 0 refills | Status: DC

## 2015-08-16 MED ORDER — LISINOPRIL 20 MG PO TABS: 20.0000 mg | ORAL_TABLET | Freq: Two times a day (BID) | ORAL | 0 refills | Status: DC

## 2015-08-16 MED ORDER — SPIRONOLACTONE 25 MG PO TABS: 25.0000 mg | ORAL_TABLET | Freq: Every day | ORAL | 0 refills | Status: DC

## 2015-08-16 MED ORDER — PRAVASTATIN SODIUM 20 MG PO TABS: 20.0000 mg | ORAL_TABLET | Freq: Every morning | ORAL | 0 refills | Status: DC

## 2015-08-21 ENCOUNTER — Telehealth: Payer: Self-pay | Admitting: Family Medicine

## 2015-08-21 NOTE — Telephone Encounter (Signed)
Noted, routed to clinical pharmacologist

## 2015-08-21 NOTE — Telephone Encounter (Signed)
Pt called states [facility] is needing auth from doctor for  metFORMIN (GLUCOPHAGE) 850 MG tablet and spironolactone (ALDACTONE) 25 MG tablet  Please f/up

## 2015-08-22 NOTE — Telephone Encounter (Signed)
Metformin is a covered medication - [facility] rejected the PA, saying it was not needed. Still awaiting response on spironolactone.

## 2015-08-22 NOTE — Telephone Encounter (Signed)
I have not received a request for prior authorization for either medication but I have filled out forms for both medications and have sent them to [facility] for approval. A decision can take up to 72 business hours per [employer].

## 2015-08-22 NOTE — Telephone Encounter (Signed)
Spironolactone also does not require prior authorization.  I called the patient to let him know that neither required a PA and he said that he would follow up with insurance company and pharmacy.  No other concerns at this time.

## 2015-09-12 ENCOUNTER — Telehealth: Payer: Self-pay | Admitting: Family Medicine

## 2015-09-12 NOTE — Telephone Encounter (Signed)
Nelson calling for clinical clarification for the formulation for extended release for the medication Diltiazem   Ref. [id number] CB#: [phone]

## 2015-09-19 NOTE — Telephone Encounter (Signed)
Will route to PCP 

## 2015-09-20 NOTE — Telephone Encounter (Signed)
This was handled via fax.

## 2015-09-27 ENCOUNTER — Ambulatory Visit: Payer: Medicare Other | Admitting: Podiatry

## 2015-10-04 ENCOUNTER — Other Ambulatory Visit: Payer: Self-pay | Admitting: Family Medicine

## 2015-10-04 DIAGNOSIS — I2510 Atherosclerotic heart disease of native coronary artery without angina pectoris: Secondary | ICD-10-CM

## 2015-10-07 ENCOUNTER — Other Ambulatory Visit: Payer: Self-pay | Admitting: Family Medicine

## 2015-10-07 DIAGNOSIS — I10 Essential (primary) hypertension: Secondary | ICD-10-CM

## 2015-10-11 ENCOUNTER — Other Ambulatory Visit: Payer: Self-pay | Admitting: Family Medicine

## 2015-10-11 DIAGNOSIS — I2510 Atherosclerotic heart disease of native coronary artery without angina pectoris: Secondary | ICD-10-CM

## 2015-10-20 ENCOUNTER — Other Ambulatory Visit: Payer: Self-pay | Admitting: Family Medicine

## 2015-10-20 DIAGNOSIS — K2100 Gastro-esophageal reflux disease with esophagitis, without bleeding: Secondary | ICD-10-CM

## 2015-10-31 ENCOUNTER — Other Ambulatory Visit: Payer: Self-pay | Admitting: Family Medicine

## 2015-10-31 DIAGNOSIS — I480 Paroxysmal atrial fibrillation: Secondary | ICD-10-CM

## 2015-11-06 ENCOUNTER — Other Ambulatory Visit: Payer: Self-pay | Admitting: Pharmacist

## 2015-11-06 DIAGNOSIS — I2510 Atherosclerotic heart disease of native coronary artery without angina pectoris: Secondary | ICD-10-CM

## 2015-11-06 MED ORDER — ISOSORBIDE MONONITRATE ER 60 MG PO TB24: 60.0000 mg | ORAL_TABLET | Freq: Every day | ORAL | 0 refills | Status: DC

## 2015-12-19 ENCOUNTER — Other Ambulatory Visit: Payer: Self-pay | Admitting: Family Medicine

## 2015-12-19 DIAGNOSIS — I2510 Atherosclerotic heart disease of native coronary artery without angina pectoris: Secondary | ICD-10-CM

## 2015-12-19 DIAGNOSIS — I10 Essential (primary) hypertension: Secondary | ICD-10-CM

## 2015-12-30 ENCOUNTER — Ambulatory Visit: Payer: Medicare Other | Admitting: Family Medicine

## 2015-12-31 ENCOUNTER — Ambulatory Visit: Payer: Medicare Other | Admitting: Family Medicine

## 2016-01-03 ENCOUNTER — Other Ambulatory Visit: Payer: Self-pay | Admitting: Family Medicine

## 2016-01-03 DIAGNOSIS — I2510 Atherosclerotic heart disease of native coronary artery without angina pectoris: Secondary | ICD-10-CM

## 2016-01-03 DIAGNOSIS — E785 Hyperlipidemia, unspecified: Secondary | ICD-10-CM

## 2016-01-03 DIAGNOSIS — E119 Type 2 diabetes mellitus without complications: Secondary | ICD-10-CM

## 2016-01-03 DIAGNOSIS — I480 Paroxysmal atrial fibrillation: Secondary | ICD-10-CM

## 2016-01-03 DIAGNOSIS — I10 Essential (primary) hypertension: Secondary | ICD-10-CM

## 2016-02-07 ENCOUNTER — Ambulatory Visit: Payer: Medicare Other | Attending: Family Medicine | Admitting: Family Medicine

## 2016-02-07 ENCOUNTER — Encounter: Payer: Self-pay | Admitting: Family Medicine

## 2016-02-07 VITALS — BP 112/74 | HR 76 | Temp 97.8°F | Ht 71.0 in | Wt 217.8 lb

## 2016-02-07 DIAGNOSIS — Z125 Encounter for screening for malignant neoplasm of prostate: Secondary | ICD-10-CM | POA: Diagnosis not present

## 2016-02-07 DIAGNOSIS — B360 Pityriasis versicolor: Secondary | ICD-10-CM

## 2016-02-07 DIAGNOSIS — Z7984 Long term (current) use of oral hypoglycemic drugs: Secondary | ICD-10-CM | POA: Insufficient documentation

## 2016-02-07 DIAGNOSIS — E7800 Pure hypercholesterolemia, unspecified: Secondary | ICD-10-CM | POA: Diagnosis not present

## 2016-02-07 DIAGNOSIS — Z87891 Personal history of nicotine dependence: Secondary | ICD-10-CM | POA: Diagnosis not present

## 2016-02-07 DIAGNOSIS — I2510 Atherosclerotic heart disease of native coronary artery without angina pectoris: Secondary | ICD-10-CM | POA: Diagnosis not present

## 2016-02-07 DIAGNOSIS — Z7982 Long term (current) use of aspirin: Secondary | ICD-10-CM | POA: Diagnosis not present

## 2016-02-07 DIAGNOSIS — I480 Paroxysmal atrial fibrillation: Secondary | ICD-10-CM | POA: Diagnosis not present

## 2016-02-07 DIAGNOSIS — Z0001 Encounter for general adult medical examination with abnormal findings: Secondary | ICD-10-CM | POA: Insufficient documentation

## 2016-02-07 DIAGNOSIS — Z79899 Other long term (current) drug therapy: Secondary | ICD-10-CM | POA: Diagnosis not present

## 2016-02-07 DIAGNOSIS — I10 Essential (primary) hypertension: Secondary | ICD-10-CM | POA: Diagnosis not present

## 2016-02-07 DIAGNOSIS — E119 Type 2 diabetes mellitus without complications: Secondary | ICD-10-CM | POA: Diagnosis not present

## 2016-02-07 DIAGNOSIS — K2100 Gastro-esophageal reflux disease with esophagitis, without bleeding: Secondary | ICD-10-CM

## 2016-02-07 LAB — GLUCOSE, POCT (MANUAL RESULT ENTRY): POC Glucose: 121 mg/dl — AB (ref 70–99)

## 2016-02-07 LAB — PSA: PSA: 3.6 ng/mL (ref <=4.0)

## 2016-02-07 LAB — POCT GLYCOSYLATED HEMOGLOBIN (HGB A1C): HEMOGLOBIN A1C: 6.2

## 2016-02-07 MED ORDER — ISOSORBIDE MONONITRATE ER 60 MG PO TB24: 60.0000 mg | ORAL_TABLET | Freq: Every day | ORAL | 3 refills | Status: DC

## 2016-02-07 MED ORDER — DOXAZOSIN MESYLATE 2 MG PO TABS: 2.0000 mg | ORAL_TABLET | Freq: Every day | ORAL | 3 refills | Status: DC

## 2016-02-07 MED ORDER — PANTOPRAZOLE SODIUM 40 MG PO TBEC: DELAYED_RELEASE_TABLET | ORAL | 3 refills | Status: DC

## 2016-02-07 MED ORDER — DILTIAZEM HCL ER COATED BEADS 180 MG PO CP24: ORAL_CAPSULE | ORAL | 3 refills | Status: DC

## 2016-02-07 MED ORDER — LISINOPRIL 20 MG PO TABS: 20.0000 mg | ORAL_TABLET | Freq: Two times a day (BID) | ORAL | 3 refills | Status: DC

## 2016-02-07 MED ORDER — HYDRALAZINE HCL 50 MG PO TABS: ORAL_TABLET | ORAL | 3 refills | Status: DC

## 2016-02-07 MED ORDER — PRAVASTATIN SODIUM 20 MG PO TABS: 20.0000 mg | ORAL_TABLET | Freq: Every morning | ORAL | 3 refills | Status: DC

## 2016-02-07 MED ORDER — KETOCONAZOLE 2 % EX CREA: 1.0000 "application " | TOPICAL_CREAM | Freq: Two times a day (BID) | CUTANEOUS | 0 refills | Status: DC

## 2016-02-07 MED ORDER — METFORMIN HCL 850 MG PO TABS: ORAL_TABLET | ORAL | 3 refills | Status: DC

## 2016-02-07 MED ORDER — SPIRONOLACTONE 25 MG PO TABS: 25.0000 mg | ORAL_TABLET | Freq: Every day | ORAL | 3 refills | Status: DC

## 2016-02-07 MED ORDER — CARVEDILOL 25 MG PO TABS: ORAL_TABLET | ORAL | 3 refills | Status: DC

## 2016-02-07 NOTE — Assessment & Plan Note (Signed)
A fib rate controlled Continue current  Diltiazem, coreg and aspirin

## 2016-02-07 NOTE — Assessment & Plan Note (Signed)
Well-controlled.  Continue current regimen. 

## 2016-02-07 NOTE — Progress Notes (Signed)
Subjective:  Patient ID: [name], male    DOB: 1955-03-12  Age: 61 y.o. MRN: [id number]  CC: Diabetes   HPI [name] has hx of CVA with residual weakness he  presents for    1. CHRONIC DIABETES  Disease Monitoring  Blood Sugar Ranges: 99-126   Polyuria: no   Visual problems: no   Medication Compliance: yes  Medication Side Effects  Hypoglycemia: no   Preventitive Health Care  Eye Exam: has an appt next week   Foot Exam: done today     2. CHRONIC HYPERTENSION  Disease Monitoring  Blood pressure range: not checking   Chest pain: no   Dyspnea: no   Claudication: no   Medication compliance: yes  Medication Side Effects  Lightheadedness: no   Urinary frequency: no   Edema: yes, sometimes, depends on if he elevates.     3. HM: he reports that he completed his zostavax, flu shot and pneumovax. Declines tetanus shot today. Request PSA.   Social History  Substance Use Topics  . Smoking status: Former Smoker    Types: Cigarettes    Quit date: 12/19/2005  . Smokeless tobacco: Never Used  . Alcohol use No     Comment: quit in 2012    Outpatient Medications Prior to Visit  Medication Sig Dispense Refill  . Alcohol Swabs (ALCOHOL PREP) 70 % PADS     . aspirin 81 MG tablet Take 1 tablet (81 mg total) by mouth daily at 12 noon. 30 tablet 3  . ASSURE COMFORT LANCETS 30G MISC     . Blood Glucose Calibration (CARESENS CONTROL A) SOLN     . Blood Glucose Monitoring Suppl ([place]) DEVI     . CARESENS N GLUCOSE TEST test strip     . carvedilol (COREG) 25 MG tablet TAKE 1 TABLET BY MOUTH TWO  TIMES DAILY WITH MEALS 60 tablet 0  . diltiazem (CARDIZEM CD) 180 MG 24 hr capsule TAKE 2 CAPSULES BY MOUTH  DAILY AT NOON. 180 capsule 0  . doxazosin (CARDURA) 2 MG tablet TAKE 1 TABLET BY MOUTH  DAILY 30 tablet 0  . glucose monitoring kit (FREESTYLE) monitoring kit 1 each by Does not apply route 4 (four) times daily - after meals and at bedtime. 1 month  Diabetic Testing Supplies for QAC-QHS accuchecks. 1 each 1  . hydrALAZINE (APRESOLINE) 50 MG tablet TAKE 2 AND 1/4 TABLETS  TWICE A DAY 405 tablet 0  . isosorbide mononitrate (IMDUR) 60 MG 24 hr tablet Take 1 tablet (60 mg total) by mouth daily. 180 tablet 0  . Lancet Devices (ADJUSTABLE LANCING DEVICE) MISC     . lisinopril (PRINIVIL,ZESTRIL) 20 MG tablet TAKE 1 TABLET BY MOUTH TWO  TIMES DAILY 60 tablet 0  . metFORMIN (GLUCOPHAGE) 850 MG tablet TAKE 1 TABLET BY MOUTH TWO  TIMES DAILY WITH MEALS 60 tablet 0  . pantoprazole (PROTONIX) 40 MG tablet TAKE 1 TABLET BY MOUTH  DAILY AT 12 NOON. 90 tablet 0  . pravastatin (PRAVACHOL) 20 MG tablet TAKE 1 TABLET BY MOUTH  EVERY MORNING 90 tablet 0  . spironolactone (ALDACTONE) 25 MG tablet TAKE 1 TABLET BY MOUTH  DAILY 30 tablet 0  . Zoster Vaccine Live, PF, (ZOSTAVAX) 40981 UNT/0.65ML injection Inject 19,400 Units into the skin once. 1 each 0   No facility-administered medications prior to visit.     ROS Review of Systems  Constitutional: Negative for chills, fatigue, fever and unexpected weight  change.  Eyes: Negative for visual disturbance.  Respiratory: Negative for cough and shortness of breath.   Cardiovascular: Negative for chest pain, palpitations and leg swelling.  Gastrointestinal: Negative for abdominal pain, blood in stool, constipation, diarrhea, nausea and vomiting.  Endocrine: Negative for polydipsia, polyphagia and polyuria.  Musculoskeletal: Negative for arthralgias, back pain, gait problem, myalgias and neck pain.  Skin: Negative for rash.  Allergic/Immunologic: Negative for immunocompromised state.  Hematological: Negative for adenopathy. Does not bruise/bleed easily.  Psychiatric/Behavioral: Negative for dysphoric mood, sleep disturbance and suicidal ideas. The patient is not nervous/anxious.     Objective:  BP 112/74 (BP Location: Left Arm, Patient Position: Sitting, Cuff Size: Small)   Pulse 76   Temp 97.8 F (36.6 C)  (Oral)   Ht 5' 11"  (1.803 m)   Wt 217 lb 12.8 oz (98.8 kg)   SpO2 98%   BMI 30.38 kg/m   BP/Weight 02/07/2016 06/13/2015 9/82/6415  Systolic BP 830 940 90  Diastolic BP 74 70 66  Wt. (Lbs) 217.8 218 217.8  BMI 30.38 30.42 30.39   Physical Exam  Constitutional: He appears well-developed and well-nourished. No distress.  HENT:  Head: Normocephalic and atraumatic.  Neck: Normal range of motion. Neck supple.  Cardiovascular: Normal rate, normal heart sounds and intact distal pulses.  An irregularly irregular rhythm present.  Pulmonary/Chest: Effort normal and breath sounds normal.  Musculoskeletal: He exhibits no edema.  Neurological: He is alert. Gait (he walks with a walker ) abnormal.  Skin: Skin is warm and dry. No rash noted. No erythema.  Psychiatric: He has a normal mood and affect.   Lab Results  Component Value Date   HGBA1C 6.3 06/13/2015   Lab Results  Component Value Date   HGBA1C 6.2 02/07/2016    CBG 121  Assessment & Plan:   Rashawd was seen today for diabetes.  Diagnoses and all orders for this visit:  Controlled type 2 diabetes mellitus without complication, without long-term current use of insulin (HCC) -     POCT glucose (manual entry) -     POCT glycosylated hemoglobin (Hb A1C) -     COMPLETE METABOLIC PANEL WITH GFR -     metFORMIN (GLUCOPHAGE) 850 MG tablet; TAKE 1 TABLET BY MOUTH TWO  TIMES DAILY WITH MEALS  Pure hypercholesterolemia -     Lipid Panel -     pravastatin (PRAVACHOL) 20 MG tablet; Take 1 tablet (20 mg total) by mouth every morning.  Paroxysmal atrial fibrillation (HCC) -     carvedilol (COREG) 25 MG tablet; TAKE 1 TABLET BY MOUTH TWO  TIMES DAILY WITH MEALS -     diltiazem (CARDIZEM CD) 180 MG 24 hr capsule; TAKE 2 CAPSULES BY MOUTH  DAILY AT NOON.  Essential hypertension -     doxazosin (CARDURA) 2 MG tablet; Take 1 tablet (2 mg total) by mouth daily. -     lisinopril (PRINIVIL,ZESTRIL) 20 MG tablet; Take 1 tablet (20 mg total) by  mouth 2 (two) times daily.  Coronary artery disease involving native coronary artery of native heart without angina pectoris -     hydrALAZINE (APRESOLINE) 50 MG tablet; TAKE 2 AND 1/4 TABLETS  TWICE A DAY -     isosorbide mononitrate (IMDUR) 60 MG 24 hr tablet; Take 1 tablet (60 mg total) by mouth daily. -     lisinopril (PRINIVIL,ZESTRIL) 20 MG tablet; Take 1 tablet (20 mg total) by mouth 2 (two) times daily. -     spironolactone (ALDACTONE) 25  MG tablet; Take 1 tablet (25 mg total) by mouth daily.  Gastroesophageal reflux disease with esophagitis -     pantoprazole (PROTONIX) 40 MG tablet; TAKE 1 TABLET BY MOUTH  DAILY AT 12 NOON.  Prostate cancer screening -     PSA  Tinea versicolor -     ketoconazole (NIZORAL) 2 % cream; Apply 1 application topically 2 (two) times daily. To neck rash    No orders of the defined types were placed in this encounter.   Follow-up: Return in about 6 months (around 08/06/2016) for HTN and diabetes .   Boykin Nearing MD

## 2016-02-07 NOTE — Patient Instructions (Addendum)
Cheney was seen today for diabetes.  Diagnoses and all orders for this visit:  Controlled type 2 diabetes mellitus without complication, without long-term current use of insulin (HCC) -     POCT glucose (manual entry) -     POCT glycosylated hemoglobin (Hb A1C) -     COMPLETE METABOLIC PANEL WITH GFR -     metFORMIN (GLUCOPHAGE) 850 MG tablet; TAKE 1 TABLET BY MOUTH TWO  TIMES DAILY WITH MEALS  Pure hypercholesterolemia -     Lipid Panel -     pravastatin (PRAVACHOL) 20 MG tablet; Take 1 tablet (20 mg total) by mouth every morning.  Paroxysmal atrial fibrillation (HCC) -     carvedilol (COREG) 25 MG tablet; TAKE 1 TABLET BY MOUTH TWO  TIMES DAILY WITH MEALS -     diltiazem (CARDIZEM CD) 180 MG 24 hr capsule; TAKE 2 CAPSULES BY MOUTH  DAILY AT NOON.  Essential hypertension -     doxazosin (CARDURA) 2 MG tablet; Take 1 tablet (2 mg total) by mouth daily. -     lisinopril (PRINIVIL,ZESTRIL) 20 MG tablet; Take 1 tablet (20 mg total) by mouth 2 (two) times daily.  Coronary artery disease involving native coronary artery of native heart without angina pectoris -     hydrALAZINE (APRESOLINE) 50 MG tablet; TAKE 2 AND 1/4 TABLETS  TWICE A DAY -     isosorbide mononitrate (IMDUR) 60 MG 24 hr tablet; Take 1 tablet (60 mg total) by mouth daily. -     lisinopril (PRINIVIL,ZESTRIL) 20 MG tablet; Take 1 tablet (20 mg total) by mouth 2 (two) times daily. -     spironolactone (ALDACTONE) 25 MG tablet; Take 1 tablet (25 mg total) by mouth daily.  Gastroesophageal reflux disease with esophagitis -     pantoprazole (PROTONIX) 40 MG tablet; TAKE 1 TABLET BY MOUTH  DAILY AT 12 NOON.  Prostate cancer screening -     PSA  Tinea versicolor -     ketoconazole (NIZORAL) 2 % cream; Apply 1 application topically 2 (two) times daily. To neck rash  your diabetes and blood pressure remain well controlled Keep follow up with you cardiologist  F/u in 6 months for HTN and diabetes   Dr. Adrian Blackwater

## 2016-02-08 LAB — COMPLETE METABOLIC PANEL WITH GFR
ALT: 9 U/L (ref 9–46)
AST: 12 U/L (ref 10–35)
Albumin: 4.2 g/dL (ref 3.6–5.1)
Alkaline Phosphatase: 44 U/L (ref 40–115)
BILIRUBIN TOTAL: 0.3 mg/dL (ref 0.2–1.2)
BUN: 22 mg/dL (ref 7–25)
CHLORIDE: 102 mmol/L (ref 98–110)
CO2: 21 mmol/L (ref 20–31)
Calcium: 9.7 mg/dL (ref 8.6–10.3)
Creat: 1.24 mg/dL (ref 0.70–1.25)
GFR, EST AFRICAN AMERICAN: 72 mL/min (ref >=60)
GFR, EST NON AFRICAN AMERICAN: 62 mL/min (ref >=60)
GLUCOSE: 113 mg/dL — AB (ref 65–99)
Potassium: 4.5 mmol/L (ref 3.5–5.3)
SODIUM: 138 mmol/L (ref 135–146)
TOTAL PROTEIN: 7 g/dL (ref 6.1–8.1)

## 2016-02-08 LAB — LIPID PANEL
CHOL/HDL RATIO: 2.4 ratio (ref <5.0)
CHOLESTEROL: 131 mg/dL (ref <200)
HDL: 55 mg/dL (ref >40)
LDL Cholesterol: 51 mg/dL (ref <100)
TRIGLYCERIDES: 124 mg/dL (ref <150)
VLDL: 25 mg/dL (ref <30)

## 2016-03-13 ENCOUNTER — Telehealth: Payer: Self-pay | Admitting: Family Medicine

## 2016-03-13 NOTE — Telephone Encounter (Signed)
Please inform patient that his insurance company as requested a review of his use of protonix for GERD If he no longer has GERD symptoms he is advised to stop protonix, If he still has some symptoms he is advised to change from protonix (PPI) to  Ranitidine 75 mg BID (H2 blocker) maintenance therapy  This is due to increase risk of osteoporosis and C. Diff on long term PPI.

## 2016-03-13 NOTE — Telephone Encounter (Signed)
Pt. Called stating that he received his medication through the mail and that he usually receives a 90 day supply and now he is only receiving a 30 day supply. Please f/u with pt.

## 2016-03-18 NOTE — Telephone Encounter (Signed)
Pt received 90 day supply of medication for all his meds that were refilled on 02/07/16. Pt has refills on medications as well.

## 2016-03-27 ENCOUNTER — Encounter: Payer: Self-pay | Admitting: Cardiovascular Disease

## 2016-03-27 ENCOUNTER — Ambulatory Visit (INDEPENDENT_AMBULATORY_CARE_PROVIDER_SITE_OTHER): Payer: Medicare Other | Admitting: Cardiovascular Disease

## 2016-03-27 VITALS — BP 88/68 | HR 68 | Ht 71.0 in | Wt 217.8 lb

## 2016-03-27 DIAGNOSIS — I482 Chronic atrial fibrillation, unspecified: Secondary | ICD-10-CM

## 2016-03-27 DIAGNOSIS — I10 Essential (primary) hypertension: Secondary | ICD-10-CM | POA: Diagnosis not present

## 2016-03-27 MED ORDER — APIXABAN 5 MG PO TABS: 5.0000 mg | ORAL_TABLET | Freq: Two times a day (BID) | ORAL | 3 refills | Status: DC

## 2016-03-27 NOTE — Patient Instructions (Signed)
Medication Instructions:  STOP Aspirin STOP Cardura (Doxazosin) START Eliquis (Apixaban) 5 mg twice daily   Labwork: Your physician recommends that you return for lab work in: 1 month for CBC, BMET   Testing/Procedures: None Ordered   Follow-Up: Your physician recommends that you schedule a follow-up appointment in: 1 month with CVRR for new Eliquis start  Your physician wants you to follow-up in: 1 year with Dr. Acie Fredrickson. You will receive a reminder letter in the mail two months in advance. If you don't receive a letter, please call our office to schedule the follow-up appointment.   If you need a refill on your cardiac medications before your next appointment, please call your pharmacy.   Thank you for choosing [facility]! Christen Bame, RN [phone]

## 2016-03-27 NOTE — Progress Notes (Signed)
Cardiology Office Note   Date:  03/27/2016   ID:  [name], [name] [birth year], MRN [id number]  PCP:  Minerva Ends, MD  Cardiologist:   Mertie Moores, MD  Former patient of Dr. Verl Blalock   Chief Complaint  Patient presents with  . Coronary Artery Disease   Problem List 1. Atrial fib  2. Chronic diastolic CHF 3. Essential HTN 4. Intracranial hemorrhage ( on coumadin )  5. DM 6. COPD    History of Present Illness: [name] is a 61 y.o. male who presents for further evaluation of paroxysmal atrial fibrillation.  He was last seen by Dr. Verl Blalock in Dec. 2014.  No CP recently, no dyspnea  Has better fine motor control in the left side of his body   Able to walk with a walker,  Was seen today in a wheelchair.   March 27, 2016:  Now using a walker ( was in a wheelchair at his last visit )  No CP or dyspnea  BP remains low    Past Medical History:  Diagnosis Date  . A-fib ([place]) 11/19/2010  . Acute exacerbation of congestive heart failure ([place]) 11/19/2010  . Arthritis   . CAD (coronary artery disease) 11/20/2010  . CHF (congestive heart failure) ([place])   . Coronary artery disease   . Diabetes mellitus   . Diabetes mellitus 11/19/2010  . Gout   . Hyperlipemia 11/21/2010  . Hypertension   . Hypertensive emergency 11/19/2010  . ICH (intracerebral hemorrhage) ([place]) 12/22/2010  . Physical deconditioning 12/22/2010  . Pulmonary edema 11/19/2010  . Respiratory failure ([place]) 11/19/2010  . Shortness of breath   . Stroke ([place]) 11/19/2010  . Thyroiditis 11/20/2010    Past Surgical History:  Procedure Laterality Date  . PEG PLACEMENT  12/03/2010   Procedure: PERCUTANEOUS ENDOSCOPIC GASTROSTOMY (PEG) PLACEMENT;  Surgeon: Lafayette Dragon, MD;  Location: [facility] ENDOSCOPY;  Service: Endoscopy;  Laterality: N/A;  . TRACHEOSTOMY TUBE PLACEMENT  11/28/2010   Procedure: TRACHEOSTOMY;  Surgeon: Beckie Salts, MD;  Location: [facility];  Service: ENT;  Laterality: N/A;     Current  Outpatient Prescriptions  Medication Sig Dispense Refill  . Alcohol Swabs (ALCOHOL PREP) 70 % PADS     . aspirin 81 MG tablet Take 1 tablet (81 mg total) by mouth daily at 12 noon. 30 tablet 3  . ASSURE COMFORT LANCETS 30G MISC     . Blood Glucose Calibration (CARESENS CONTROL A) SOLN     . Blood Glucose Monitoring Suppl ([place]) DEVI     . CARESENS N GLUCOSE TEST test strip     . carvedilol (COREG) 25 MG tablet TAKE 1 TABLET BY MOUTH TWO  TIMES DAILY WITH MEALS 180 tablet 3  . diltiazem (CARDIZEM CD) 180 MG 24 hr capsule TAKE 2 CAPSULES BY MOUTH  DAILY AT NOON. 180 capsule 3  . doxazosin (CARDURA) 2 MG tablet Take 1 tablet (2 mg total) by mouth daily. 90 tablet 3  . glucose monitoring kit (FREESTYLE) monitoring kit 1 each by Does not apply route 4 (four) times daily - after meals and at bedtime. 1 month Diabetic Testing Supplies for QAC-QHS accuchecks. 1 each 1  . hydrALAZINE (APRESOLINE) 50 MG tablet TAKE 2 AND 1/4 TABLETS  TWICE A DAY 405 tablet 3  . isosorbide mononitrate (IMDUR) 60 MG 24 hr tablet Take 1 tablet (60 mg total) by mouth daily. 90 tablet 3  . ketoconazole (NIZORAL) 2 % cream Apply 1  application topically 2 (two) times daily. To neck rash 30 g 0  . Lancet Devices (ADJUSTABLE LANCING DEVICE) MISC     . lisinopril (PRINIVIL,ZESTRIL) 20 MG tablet Take 1 tablet (20 mg total) by mouth 2 (two) times daily. 180 tablet 3  . metFORMIN (GLUCOPHAGE) 850 MG tablet TAKE 1 TABLET BY MOUTH TWO  TIMES DAILY WITH MEALS 180 tablet 3  . pantoprazole (PROTONIX) 40 MG tablet TAKE 1 TABLET BY MOUTH  DAILY AT 12 NOON. 90 tablet 3  . pravastatin (PRAVACHOL) 20 MG tablet Take 1 tablet (20 mg total) by mouth every morning. 90 tablet 3  . spironolactone (ALDACTONE) 25 MG tablet Take 1 tablet (25 mg total) by mouth daily. 90 tablet 3   No current facility-administered medications for this visit.     Allergies:   Patient has no known allergies.    Social History:  The patient   reports that he quit smoking about 10 years ago. His smoking use included Cigarettes. He has never used smokeless tobacco. He reports that he does not drink alcohol or use drugs.   Family History:  The patient's family history includes Diabetes in his brother and mother.    ROS:  Please see the history of present illness.    Review of Systems: Constitutional:  denies fever, chills, diaphoresis, appetite change and fatigue.  HEENT: denies photophobia, eye pain, redness, hearing loss, ear pain, congestion, sore throat, rhinorrhea, sneezing, neck pain, neck stiffness and tinnitus.  Respiratory: denies SOB, DOE, cough, chest tightness, and wheezing.  Cardiovascular: denies chest pain, palpitations and leg swelling.  Gastrointestinal: denies nausea, vomiting, abdominal pain, diarrhea, constipation, blood in stool.  Genitourinary: denies dysuria, urgency, frequency, hematuria, flank pain and difficulty urinating.  Musculoskeletal: denies  myalgias, back pain, joint swelling, arthralgias and gait problem.   Skin: denies pallor, rash and wound.  Neurological: denies dizziness, seizures, syncope, weakness, light-headedness, numbness and headaches.   Hematological: denies adenopathy, easy bruising, personal or family bleeding history.  Psychiatric/ Behavioral: denies suicidal ideation, mood changes, confusion, nervousness, sleep disturbance and agitation.       All other systems are reviewed and negative.    PHYSICAL EXAM: VS:  BP (!) 88/68 (BP Location: Right Arm, Patient Position: Sitting, Cuff Size: Large)   Pulse 68   Ht 5' 11"  (1.803 m)   Wt 217 lb 12.8 oz (98.8 kg)   SpO2 98%   BMI 30.38 kg/m  , BMI Body mass index is 30.38 kg/m. GEN: Well nourished, well developed, in no acute distress  HEENT: normal  Neck: no JVD, carotid bruits, or masses Cardiac: Irrg. Irreg. ; no murmurs, rubs, or gallops,no edema  Respiratory:  clear to auscultation bilaterally, normal work of breathing GI:  soft, nontender, nondistended, + BS MS: no deformity or atrophy  Skin: warm and dry, no rash Neuro:  Strength and sensation are intact Psych: normal   EKG:  EKG is ordered today. The ekg ordered March 27, 2016:  Atrial fib with HR of 68.   Voltage for LVH.     Recent Labs: 02/07/2016: ALT 9; BUN 22; Creat 1.24; Potassium 4.5; Sodium 138    Lipid Panel    Component Value Date/Time   CHOL 131 02/07/2016 1152   TRIG 124 02/07/2016 1152   HDL 55 02/07/2016 1152   CHOLHDL 2.4 02/07/2016 1152   VLDL 25 02/07/2016 1152   LDLCALC 51 02/07/2016 1152      Wt Readings from Last 3 Encounters:  03/27/16 217 lb 12.8  oz (98.8 kg)  02/07/16 217 lb 12.8 oz (98.8 kg)  06/13/15 218 lb (98.9 kg)      Other studies Reviewed: Additional studies/ records that were reviewed today include: . Review of the above records demonstrates:    ASSESSMENT AND PLAN:  1. Atrial fib - he is in chronic atrial fibrillation.  CHADS2VASC is 2 ( HTN, diastolic CHF )   He has a history of an intracranial bleed years ago while on Coumadin. He has been given clearance to start Eliquis.   We'll start him on  Eliquis 5 mg twice a day.  Check a CBC and basic medical profile in one month.  He was to follow-up with me in 1 year. He will see Dr. Adrian [place] in the meanwhile    2. Chronic diastolic CHF - stable  3. Essential HTN -  Is still low       Will hold cardura   4. Intracranial hemorrhage ( on coumadin ) - will watch very closely while on Eliquis  5. DM 6. COPD    Current medicines are reviewed at length with the patient today.  The patient does not have concerns regarding medicines.  The following changes have been made:  no change  Labs/ tests ordered today include:  No orders of the defined types were placed in this encounter.   Disposition:   FU with me in 1 year.     Mertie Moores, MD  03/27/2016 11:57 AM    [address], [place], [place]   [address] Phone: [phone]; Fax: [phone]

## 2016-04-28 ENCOUNTER — Other Ambulatory Visit: Payer: Medicare Other

## 2016-04-28 ENCOUNTER — Ambulatory Visit: Payer: Medicare Other

## 2016-04-29 ENCOUNTER — Other Ambulatory Visit: Payer: Medicare Other

## 2016-04-29 ENCOUNTER — Ambulatory Visit: Payer: Medicare Other

## 2016-05-20 ENCOUNTER — Other Ambulatory Visit: Payer: Medicare Other

## 2016-05-20 ENCOUNTER — Ambulatory Visit: Payer: Medicare Other

## 2016-05-21 ENCOUNTER — Other Ambulatory Visit: Payer: Medicare Other | Admitting: *Deleted

## 2016-05-21 ENCOUNTER — Ambulatory Visit: Payer: Medicare Other

## 2016-05-21 ENCOUNTER — Ambulatory Visit (INDEPENDENT_AMBULATORY_CARE_PROVIDER_SITE_OTHER): Payer: Medicare Other | Admitting: *Deleted

## 2016-05-21 ENCOUNTER — Other Ambulatory Visit: Payer: Self-pay | Admitting: Nurse Practitioner

## 2016-05-21 DIAGNOSIS — I482 Chronic atrial fibrillation, unspecified: Secondary | ICD-10-CM

## 2016-05-21 DIAGNOSIS — I10 Essential (primary) hypertension: Secondary | ICD-10-CM

## 2016-05-21 LAB — CBC
Hematocrit: 38.7 % (ref 37.5–51.0)
Hemoglobin: 13.4 g/dL (ref 13.0–17.7)
MCH: 30.2 pg (ref 26.6–33.0)
MCHC: 34.6 g/dL (ref 31.5–35.7)
MCV: 87 fL (ref 79–97)
PLATELETS: 280 10*3/uL (ref 150–379)
RBC: 4.44 x10E6/uL (ref 4.14–5.80)
RDW: 14 % (ref 12.3–15.4)
WBC: 5.5 10*3/uL (ref 3.4–10.8)

## 2016-05-21 LAB — BASIC METABOLIC PANEL
BUN / CREAT RATIO: 21 (ref 10–24)
BUN: 25 mg/dL (ref 8–27)
CHLORIDE: 95 mmol/L — AB (ref 96–106)
CO2: 21 mmol/L (ref 18–29)
Calcium: 9.8 mg/dL (ref 8.6–10.2)
Creatinine, Ser: 1.19 mg/dL (ref 0.76–1.27)
GFR calc non Af Amer: 66 mL/min/{1.73_m2} (ref >59)
GFR, EST AFRICAN AMERICAN: 76 mL/min/{1.73_m2} (ref >59)
GLUCOSE: 131 mg/dL — AB (ref 65–99)
Potassium: 4.9 mmol/L (ref 3.5–5.2)
Sodium: 134 mmol/L (ref 134–144)

## 2016-05-21 MED ORDER — APIXABAN 5 MG PO TABS: 5.0000 mg | ORAL_TABLET | Freq: Two times a day (BID) | ORAL | 0 refills | Status: DC

## 2016-05-21 NOTE — Progress Notes (Signed)
Pt was started on Eliquis 5mg  BID for Afib on approx 2 months ago after seeing Dr Acie Fredrickson on 03/27/16 and Rx sent to mail order and when pt received meds he started.   Reviewed patients medication list.  Pt is not currently on any combined P-gp and strong CYP3A4 inhibitors/inducers (ketoconazole, traconazole, ritonavir, carbamazepine, phenytoin, rifampin, St. John's wort).  Reviewed labs.  SCr 1.19, Weight 99.14 Kg, Age 61 yrs old.  Dose appropriate  based on age, weight, and SCr.  Hgb and HCT 13.4/38.7.   A full discussion of the nature of anticoagulants has been carried out.  A benefit/risk analysis has been presented to the patient, so that they understand the justification for choosing anticoagulation with Eliquis at this time.  The need for compliance is stressed.  Pt is aware to take the medication twice daily.  Side effects of potential bleeding are discussed, including unusual colored urine or stools, coughing up blood or coffee ground emesis, nose bleeds or serious fall or head trauma.  Discussed signs and symptoms of stroke. The patient should avoid any OTC items containing aspirin or ibuprofen.  Avoid alcohol consumption.   Call if any signs of abnormal bleeding.  Discussed financial obligations and resolved any difficulty in obtaining medication.  Next lab test in 3 months as Dr Acie Fredrickson wants pt followed closely.

## 2016-05-21 NOTE — Progress Notes (Signed)
api

## 2016-05-27 ENCOUNTER — Encounter: Payer: Self-pay | Admitting: Family Medicine

## 2016-06-18 ENCOUNTER — Other Ambulatory Visit: Payer: Self-pay | Admitting: Cardiovascular Disease

## 2016-06-18 NOTE — Telephone Encounter (Signed)
Pt last saw Dr Acie Fredrickson on 03/27/16, last labs on 05/21/16, Creat 1.19, age 61, weight 98.8kg, based on specified criteria pt is on appropriate dosage of Eliquis 5mg  BID.

## 2016-10-05 ENCOUNTER — Ambulatory Visit (INDEPENDENT_AMBULATORY_CARE_PROVIDER_SITE_OTHER): Payer: Medicare Other

## 2016-10-05 DIAGNOSIS — I4891 Unspecified atrial fibrillation: Secondary | ICD-10-CM | POA: Diagnosis not present

## 2016-10-05 DIAGNOSIS — Z5181 Encounter for therapeutic drug level monitoring: Secondary | ICD-10-CM | POA: Diagnosis not present

## 2016-10-05 LAB — BASIC METABOLIC PANEL
BUN / CREAT RATIO: 15 (ref 10–24)
BUN: 21 mg/dL (ref 8–27)
CHLORIDE: 101 mmol/L (ref 96–106)
CO2: 23 mmol/L (ref 20–29)
Calcium: 10.1 mg/dL (ref 8.6–10.2)
Creatinine, Ser: 1.39 mg/dL — ABNORMAL HIGH (ref 0.76–1.27)
GFR calc non Af Amer: 54 mL/min/{1.73_m2} — ABNORMAL LOW (ref >59)
GFR, EST AFRICAN AMERICAN: 62 mL/min/{1.73_m2} (ref >59)
Glucose: 120 mg/dL — ABNORMAL HIGH (ref 65–99)
POTASSIUM: 4.7 mmol/L (ref 3.5–5.2)
Sodium: 139 mmol/L (ref 134–144)

## 2016-10-05 LAB — CBC
Hematocrit: 39.1 % (ref 37.5–51.0)
Hemoglobin: 13 g/dL (ref 13.0–17.7)
MCH: 29.6 pg (ref 26.6–33.0)
MCHC: 33.2 g/dL (ref 31.5–35.7)
MCV: 89 fL (ref 79–97)
PLATELETS: 270 10*3/uL (ref 150–379)
RBC: 4.39 x10E6/uL (ref 4.14–5.80)
RDW: 14 % (ref 12.3–15.4)
WBC: 6.3 10*3/uL (ref 3.4–10.8)

## 2016-10-05 NOTE — Progress Notes (Signed)
Pt was started on Eliquis 5mg  twice a day for AFIB on 03/27/16 by Mertie Moores.    Reviewed patients medication list.  Pt is not currently on any combined P-gp and strong CYP3A4 inhibitors/inducers (ketoconazole, traconazole, ritonavir, carbamazepine, phenytoin, rifampin, St. John's wort).  Reviewed labs: SCr-1.39, Hgb-13.0, HCT-39.1, Weight-98.8kg; dose appropriate based on age, weight, and SCr.  Hgb and HCT within normal limits.    A full discussion of the nature of anticoagulants has been carried out.  A benefit/risk analysis has been presented to the patient, so that they understand the justification for choosing anticoagulation with Eliquis at this time. The need for compliance is stressed.  Pt is aware to take the medication twice daily.  Side effects of potential bleeding are discussed, including unusual colored urine or stools, coughing up blood or coffee ground emesis, nose bleeds or serious fall or head trauma.  Discussed signs and symptoms of stroke. The patient should avoid any OTC items containing aspirin or ibuprofen.  Avoid alcohol consumption. Call if any signs of abnormal bleeding.  Discussed financial obligations and resolved any difficulty in obtaining medication.    10/05/16: Pt has cancelled multiple 3 month Eliquis follow-ups.  Most recent cancellation r/s appt to 11/04/16, but pt showed up in clinic today 10/05/16.  No available appt slots, in an effort to take care of pt since he appears to have transportation issues put in orders for CBC and BMP to be drawn today in clinic and we will contact pt tomorrow with results.  Alicia at registration verified pt's contact number in chart to call with results. Will await results and call with Eliquis dosing instructions once reviewed.   10/06/16: labs reviewed, pt is on correct doseage of Eliquis. Dr. Acie Fredrickson wants pt followed closely, therefore, pt needs to follow-up in 3 months. Spoke with pt & discussed lab results & appt set for 3 months.

## 2016-10-06 ENCOUNTER — Telehealth: Payer: Self-pay

## 2016-10-06 NOTE — Telephone Encounter (Signed)
Mr. Thurman is returning a call . Thanks

## 2016-10-19 ENCOUNTER — Telehealth: Payer: Self-pay | Admitting: Family Medicine

## 2016-10-19 NOTE — Telephone Encounter (Signed)
Nadine from [facility] called stating that pt. Is confused on some of his medication and that she will be faxing over a request to be able to receive pt. Medication list.

## 2016-10-26 ENCOUNTER — Ambulatory Visit: Payer: Medicare Other

## 2016-10-27 ENCOUNTER — Ambulatory Visit: Payer: Medicare Other

## 2016-11-05 ENCOUNTER — Encounter: Payer: Self-pay | Admitting: Physician Assistant

## 2016-11-05 ENCOUNTER — Ambulatory Visit: Payer: Medicare Other | Attending: Internal Medicine | Admitting: Physician Assistant

## 2016-11-05 VITALS — BP 112/73 | HR 76 | Temp 98.0°F | Resp 18 | Ht 71.0 in | Wt 226.0 lb

## 2016-11-05 DIAGNOSIS — R609 Edema, unspecified: Secondary | ICD-10-CM | POA: Insufficient documentation

## 2016-11-05 DIAGNOSIS — E785 Hyperlipidemia, unspecified: Secondary | ICD-10-CM | POA: Insufficient documentation

## 2016-11-05 DIAGNOSIS — Z8673 Personal history of transient ischemic attack (TIA), and cerebral infarction without residual deficits: Secondary | ICD-10-CM | POA: Insufficient documentation

## 2016-11-05 DIAGNOSIS — M109 Gout, unspecified: Secondary | ICD-10-CM | POA: Insufficient documentation

## 2016-11-05 DIAGNOSIS — Z79899 Other long term (current) drug therapy: Secondary | ICD-10-CM | POA: Insufficient documentation

## 2016-11-05 DIAGNOSIS — I2510 Atherosclerotic heart disease of native coronary artery without angina pectoris: Secondary | ICD-10-CM | POA: Diagnosis not present

## 2016-11-05 DIAGNOSIS — I110 Hypertensive heart disease with heart failure: Secondary | ICD-10-CM | POA: Insufficient documentation

## 2016-11-05 DIAGNOSIS — I509 Heart failure, unspecified: Secondary | ICD-10-CM | POA: Diagnosis not present

## 2016-11-05 DIAGNOSIS — Z7984 Long term (current) use of oral hypoglycemic drugs: Secondary | ICD-10-CM | POA: Diagnosis not present

## 2016-11-05 DIAGNOSIS — E119 Type 2 diabetes mellitus without complications: Secondary | ICD-10-CM | POA: Diagnosis not present

## 2016-11-05 DIAGNOSIS — Z23 Encounter for immunization: Secondary | ICD-10-CM | POA: Diagnosis not present

## 2016-11-05 DIAGNOSIS — M7989 Other specified soft tissue disorders: Secondary | ICD-10-CM | POA: Diagnosis not present

## 2016-11-05 DIAGNOSIS — I4891 Unspecified atrial fibrillation: Secondary | ICD-10-CM | POA: Insufficient documentation

## 2016-11-05 LAB — GLUCOSE, POCT (MANUAL RESULT ENTRY): POC GLUCOSE: 120 mg/dL — AB (ref 70–99)

## 2016-11-05 LAB — POCT GLYCOSYLATED HEMOGLOBIN (HGB A1C): HEMOGLOBIN A1C: 6.3

## 2016-11-05 MED ORDER — FUROSEMIDE 20 MG PO TABS
ORAL_TABLET | ORAL | 2 refills | Status: DC
Start: 2016-11-05 — End: 2017-07-30

## 2016-11-05 MED ORDER — METFORMIN HCL 850 MG PO TABS: ORAL_TABLET | ORAL | 3 refills | Status: DC

## 2016-11-05 NOTE — Progress Notes (Signed)
[name], is a 61 y.o. male  FTD:[id number]  CWC:[id number]  DOB - 1955/05/26  Subjective:  Chief Complaint and HPI: [name] is a 61 y.o. male here today for B leg swelling on and off for a few months.  It seems worse when he forgets to wear socks.  He denies SOB/CP.  BMP 1 month ago with normal electrolytes, mildly decreased GFR, and minimal elevation serum Cr.    Compliant with medications.  Check sugar about 3 times/week and always <140.  Denies s/sx hyper/hypoglycemia  ROS:   Constitutional:  No f/c, No night sweats, No unexplained weight loss. EENT:  No vision changes, No blurry vision, No hearing changes. No mouth, throat, or ear problems.  Respiratory: No cough, No SOB Cardiac: No CP, no palpitations GI:  No abd pain, No N/V/D. GU: No Urinary s/sx Musculoskeletal: No joint pain Neuro: No headache, no dizziness, no motor weakness.  Skin: No rash Endocrine:  No polydipsia. No polyuria.  Psych: Denies SI/HI  No problems updated.  ALLERGIES: No Known Allergies  PAST MEDICAL HISTORY: Past Medical History:  Diagnosis Date  . A-fib ([place]) 11/19/2010  . Acute exacerbation of congestive heart failure ([place]) 11/19/2010  . Arthritis   . CAD (coronary artery disease) 11/20/2010  . CHF (congestive heart failure) ([place])   . Coronary artery disease   . Diabetes mellitus   . Diabetes mellitus 11/19/2010  . Gout   . Hyperlipemia 11/21/2010  . Hypertension   . Hypertensive emergency 11/19/2010  . ICH (intracerebral hemorrhage) ([place]) 12/22/2010  . Physical deconditioning 12/22/2010  . Pulmonary edema 11/19/2010  . Respiratory failure ([place]) 11/19/2010  . Shortness of breath   . Stroke ([place]) 11/19/2010  . Thyroiditis 11/20/2010    MEDICATIONS AT HOME: Prior to Admission medications   Medication Sig Start Date End Date Taking? Authorizing Provider  Alcohol Swabs (ALCOHOL PREP) 70 % PADS  02/20/14  Yes [provider]  ASSURE COMFORT LANCETS 30G [place]  02/20/14  Yes  [provider]  Blood Glucose Calibration (CARESENS CONTROL A) SOLN  02/20/14  Yes [provider]  Blood Glucose Monitoring Suppl ([place]) [place]  02/20/14  Yes [provider]  CARESENS N GLUCOSE TEST test strip  02/20/14  Yes [provider]  carvedilol (COREG) 25 MG tablet TAKE 1 TABLET BY MOUTH TWO  TIMES DAILY WITH MEALS 02/07/16  Yes Funches, Josalyn, MD  diltiazem (CARDIZEM CD) 180 MG 24 hr capsule TAKE 2 CAPSULES BY MOUTH  DAILY AT NOON. 02/07/16  Yes Funches, Josalyn, MD  ELIQUIS 5 MG TABS tablet TAKE 1 TABLET BY MOUTH TWICE DAILY 06/18/16  Yes Nahser, Wonda Cheng, MD  glucose monitoring kit (FREESTYLE) monitoring kit 1 each by Does not apply route 4 (four) times daily - after meals and at bedtime. 1 month Diabetic Testing Supplies for QAC-QHS accuchecks. 12/07/12  Yes Tresa Garter, MD  hydrALAZINE (APRESOLINE) 50 MG tablet TAKE 2 AND 1/4 TABLETS  TWICE A DAY 02/07/16  Yes Funches, Josalyn, MD  isosorbide mononitrate (IMDUR) 60 MG 24 hr tablet Take 1 tablet (60 mg total) by mouth daily. 02/07/16  Yes Funches, Josalyn, MD  ketoconazole (NIZORAL) 2 % cream Apply 1 application topically 2 (two) times daily. To neck rash 02/07/16  Yes Boykin Nearing, MD  Lancet Devices (ADJUSTABLE LANCING DEVICE) MISC  02/20/14  Yes [provider]  lisinopril (PRINIVIL,ZESTRIL) 20 MG tablet Take 1 tablet (20 mg total) by mouth 2 (two) times daily. 02/07/16  Yes Funches, Josalyn, MD  metFORMIN (GLUCOPHAGE) 850 MG tablet TAKE 1 TABLET BY MOUTH TWO  TIMES DAILY WITH MEALS 11/05/16  Yes McClung, Angela M, PA-C  pantoprazole (PROTONIX) 40 MG tablet TAKE 1 TABLET BY MOUTH  DAILY AT 12 NOON. 02/07/16  Yes Funches, Josalyn, MD  pravastatin (PRAVACHOL) 20 MG tablet Take 1 tablet (20 mg total) by mouth every morning. 02/07/16  Yes Funches, Josalyn, MD  spironolactone (ALDACTONE) 25 MG tablet Take 1 tablet (25 mg total) by mouth daily. 02/07/16  Yes Funches, Josalyn, MD    furosemide (LASIX) 20 MG tablet 1 daily prn for leg swelling.  If taking more than 1-2 times per week, please schedule an appointment 11/05/16   Argentina Donovan, PA-C     Objective:  EXAM:   Vitals:   11/05/16 1351  BP: 112/73  Pulse: 76  Resp: 18  Temp: 98 F (36.7 C)  TempSrc: Oral  SpO2: 97%  Weight: 226 lb (102.5 kg)  Height: _0  (1.803 m)    General appearance : A&OX3. NAD. Non-toxic-appearing HEENT: Atraumatic and Normocephalic.  PERRLA. EOM intact.   Neck: supple, no JVD. No cervical lymphadenopathy. No thyromegaly Chest/Lungs:  Breathing-non-labored, Good air entry bilaterally, breath sounds normal without rales, rhonchi, or wheezing  CVS: S1 S2 regular, no murmurs, gallops, rubs  Extremities: Bilateral Lower Ext shows 1+ pitting edema, both legs are warm to touch with = pulse throughout Neurology:  CN II-XII grossly intact, Non focal.   Psych:  TP linear. J/I WNL. Normal speech. Appropriate eye contact and affect.  Skin:  No Rash  Data Review Lab Results  Component Value Date   HGBA1C 6.3 11/05/2016   HGBA1C 6.2 02/07/2016   HGBA1C 6.3 06/13/2015     Assessment & Plan   1. Controlled type 2 diabetes mellitus without complication, without long-term current use of insulin (HCC) Controlled-continue current regimen - Glucose (CBG) - HgB A1c - metFORMIN (GLUCOPHAGE) 850 MG tablet; TAKE 1 TABLET BY MOUTH TWO  TIMES DAILY WITH MEALS  Dispense: 180 tablet; Refill: 3 I have had a lengthy discussion and provided education about insulin resistance and the intake of too much sugar/refined carbohydrates.  I have advised the patient to work at a goal of eliminating sugary drinks, candy, desserts, sweets, refined sugars, processed foods, and white carbohydrates.  The patient expresses understanding.    2. Edema, unspecified type-no signs fulminant failure Compression stockings advised Ok to try sparingly- furosemide (LASIX) 20 MG tablet; 1 daily prn for leg  swelling.  If taking more than 1-2 times per week, please schedule an appointment  Dispense: 30 tablet; Refill: 2  3. Needs flu shot - Flu Vaccine QUAD 6+ mos PF IM (Fluarix Quad PF)   Patient have been counseled extensively about nutrition and exercise  Return in about 3 weeks (around 11/24/2016) for kepp appt with Dr Wynetta Emery.for assignment of care  The patient was given clear instructions to go to ER or return to medical center if symptoms don't improve, worsen or new problems develop. The patient verbalized understanding. The patient was told to call to get lab results if they haven't heard anything in the next week.     Freeman Caldron, PA-C [facility] [place]   11/05/2016, 2:04 PMPatient ID: RODRICK PAYSON, male   DOB: 1956-01-08, 61 y.o.   MRN: [id number]

## 2016-11-05 NOTE — Patient Instructions (Signed)
Compression stockings will help with leg swelling   Edema Edema is when you have too much fluid in your body or under your skin. Edema may make your legs, feet, and ankles swell up. Swelling is also common in looser tissues, like around your eyes. This is a common condition. It gets more common as you get older. There are many possible causes of edema. Eating too much salt (sodium) and being on your feet or sitting for a long time can cause edema in your legs, feet, and ankles. Hot weather may make edema worse. Edema is usually painless. Your skin may look swollen or shiny. Follow these instructions at home:  Keep the swollen body part raised (elevated) above the level of your heart when you are sitting or lying down.  Do not sit still or stand for a long time.  Do not wear tight clothes. Do not wear garters on your upper legs.  Exercise your legs. This can help the swelling go down.  Wear elastic bandages or support stockings as told by your doctor.  Eat a low-salt (low-sodium) diet to reduce fluid as told by your doctor.  Depending on the cause of your swelling, you may need to limit how much fluid you drink (fluid restriction).  Take over-the-counter and prescription medicines only as told by your doctor. Contact a doctor if:  Treatment is not working.  You have heart, liver, or kidney disease and have symptoms of edema.  You have sudden and unexplained weight gain. Get help right away if:  You have shortness of breath or chest pain.  You cannot breathe when you lie down.  You have pain, redness, or warmth in the swollen areas.  You have heart, liver, or kidney disease and get edema all of a sudden.  You have a fever and your symptoms get worse all of a sudden. Summary  Edema is when you have too much fluid in your body or under your skin.  Edema may make your legs, feet, and ankles swell up. Swelling is also common in looser tissues, like around your eyes.  Raise  (elevate) the swollen body part above the level of your heart when you are sitting or lying down.  Follow your doctor's instructions about diet and how much fluid you can drink (fluid restriction). This information is not intended to replace advice given to you by your health care provider. Make sure you discuss any questions you have with your health care provider. Document Released: 06/17/2007 Document Revised: 01/17/2016 Document Reviewed: 01/17/2016 Elsevier Interactive Patient Education  2017 [employer].

## 2016-11-24 ENCOUNTER — Ambulatory Visit: Payer: Medicare Other | Admitting: Internal Medicine

## 2016-12-24 ENCOUNTER — Other Ambulatory Visit: Payer: Self-pay | Admitting: Pharmacist

## 2016-12-24 DIAGNOSIS — I480 Paroxysmal atrial fibrillation: Secondary | ICD-10-CM

## 2016-12-24 MED ORDER — CARVEDILOL 25 MG PO TABS: ORAL_TABLET | ORAL | 0 refills | Status: DC

## 2017-01-21 ENCOUNTER — Ambulatory Visit (INDEPENDENT_AMBULATORY_CARE_PROVIDER_SITE_OTHER): Payer: Medicare Other | Admitting: *Deleted

## 2017-01-21 DIAGNOSIS — I4891 Unspecified atrial fibrillation: Secondary | ICD-10-CM | POA: Diagnosis not present

## 2017-01-21 DIAGNOSIS — Z5181 Encounter for therapeutic drug level monitoring: Secondary | ICD-10-CM

## 2017-01-21 LAB — BASIC METABOLIC PANEL
BUN/Creatinine Ratio: 16 (ref 10–24)
BUN: 24 mg/dL (ref 8–27)
CALCIUM: 9.8 mg/dL (ref 8.6–10.2)
CHLORIDE: 98 mmol/L (ref 96–106)
CO2: 22 mmol/L (ref 20–29)
Creatinine, Ser: 1.53 mg/dL — ABNORMAL HIGH (ref 0.76–1.27)
GFR calc Af Amer: 56 mL/min/{1.73_m2} — ABNORMAL LOW (ref >59)
GFR, EST NON AFRICAN AMERICAN: 48 mL/min/{1.73_m2} — AB (ref >59)
GLUCOSE: 126 mg/dL — AB (ref 65–99)
POTASSIUM: 5.1 mmol/L (ref 3.5–5.2)
SODIUM: 136 mmol/L (ref 134–144)

## 2017-01-21 LAB — CBC
Hematocrit: 40.8 % (ref 37.5–51.0)
Hemoglobin: 14 g/dL (ref 13.0–17.7)
MCH: 30.6 pg (ref 26.6–33.0)
MCHC: 34.3 g/dL (ref 31.5–35.7)
MCV: 89 fL (ref 79–97)
PLATELETS: 270 10*3/uL (ref 150–379)
RBC: 4.58 x10E6/uL (ref 4.14–5.80)
RDW: 14.4 % (ref 12.3–15.4)
WBC: 6.4 10*3/uL (ref 3.4–10.8)

## 2017-01-21 NOTE — Progress Notes (Signed)
Pt was started on Eliquis 5mg  twice a day for AFIB on 03/27/2016 by Dr. Acie Fredrickson.    Reviewed patients medication list.  Pt is not currently on any combined P-gp and strong CYP3A4 inhibitors/inducers (ketoconazole, traconazole, ritonavir, carbamazepine, phenytoin, rifampin, St. John's wort).  Reviewed labs: SCr-1.53, Hgb-14.0 HCT-40.8, Weight-91.0kg.  Dose appropriate based on age, weight, and SCr.  Hgb and HCT within normal limits.   A full discussion of the nature of anticoagulants has been carried out.  A benefit/risk analysis has been presented to the patient, so that they understand the justification for choosing anticoagulation with Eliquis at this time.  The need for compliance is stressed.  Pt is aware to take the medication twice daily.  Side effects of potential bleeding are discussed, including unusual colored urine or stools, coughing up blood or coffee ground emesis, nose bleeds or serious fall or head trauma.  Discussed signs and symptoms of stroke. The patient should avoid any OTC items containing aspirin or ibuprofen.  Avoid alcohol consumption.   Call if any signs of abnormal bleeding.  Discussed financial obligations and resolved any difficulty in obtaining medication.  Next lab test in 3 months per previous communication with Dr. Acie Fredrickson to follow pt closely (Intracranial hemorrhage-on coumadin)- will watch very closely while on Eliquis).   01/21/17-pt taken to the lab to have CBC & BMET drawn. Pt aware once labs return we will call him with results.  01/22/17-Spoke with pt regarding labs & instructed pt to continue Eliquis 5mg  twice a day. Pt aware that Dr. Acie Fredrickson instructed to follow his labs closely as he has had a intracranial in the past while on Coumadin. Pt made appt over the phone for April 11th 2019 in the afternoon as he states pm is better. Advised pt to call back with any questions or issues & he verbalized understanding.

## 2017-02-03 ENCOUNTER — Other Ambulatory Visit: Payer: Self-pay | Admitting: Pharmacist

## 2017-02-03 DIAGNOSIS — I480 Paroxysmal atrial fibrillation: Secondary | ICD-10-CM

## 2017-02-03 DIAGNOSIS — I2510 Atherosclerotic heart disease of native coronary artery without angina pectoris: Secondary | ICD-10-CM

## 2017-02-03 MED ORDER — ISOSORBIDE MONONITRATE ER 60 MG PO TB24: 60.0000 mg | ORAL_TABLET | Freq: Every day | ORAL | 0 refills | Status: DC

## 2017-02-03 MED ORDER — DILTIAZEM HCL ER COATED BEADS 180 MG PO CP24: ORAL_CAPSULE | ORAL | 0 refills | Status: DC

## 2017-03-03 ENCOUNTER — Other Ambulatory Visit: Payer: Self-pay | Admitting: Internal Medicine

## 2017-03-03 DIAGNOSIS — I480 Paroxysmal atrial fibrillation: Secondary | ICD-10-CM

## 2017-04-01 ENCOUNTER — Telehealth: Payer: Self-pay | Admitting: Cardiovascular Disease

## 2017-04-01 NOTE — Telephone Encounter (Signed)
Walk in pt Form-[employer] paper Dropped off. Placed in [place] doc box.

## 2017-04-08 ENCOUNTER — Telehealth: Payer: Self-pay | Admitting: Family Medicine

## 2017-04-08 DIAGNOSIS — E7800 Pure hypercholesterolemia, unspecified: Secondary | ICD-10-CM

## 2017-04-08 DIAGNOSIS — K2100 Gastro-esophageal reflux disease with esophagitis, without bleeding: Secondary | ICD-10-CM

## 2017-04-08 MED ORDER — PRAVASTATIN SODIUM 20 MG PO TABS: 20.0000 mg | ORAL_TABLET | Freq: Every morning | ORAL | 0 refills | Status: DC

## 2017-04-08 MED ORDER — PANTOPRAZOLE SODIUM 40 MG PO TBEC: DELAYED_RELEASE_TABLET | ORAL | 0 refills | Status: DC

## 2017-04-08 NOTE — Telephone Encounter (Signed)
Refilled x 30 days - must have office visit for further refills 

## 2017-04-08 NOTE — Telephone Encounter (Signed)
[facility] called requesting a refill for the following medication Reference number [id number]  pravastatin (PRAVACHOL) 20 MG tablet  pantoprazole (PROTONIX) 40 MG tablet   Please follow up

## 2017-04-21 ENCOUNTER — Telehealth: Payer: Self-pay | Admitting: Family Medicine

## 2017-04-21 ENCOUNTER — Other Ambulatory Visit: Payer: Self-pay | Admitting: Cardiovascular Disease

## 2017-04-21 ENCOUNTER — Other Ambulatory Visit: Payer: Self-pay | Admitting: Internal Medicine

## 2017-04-21 DIAGNOSIS — I2510 Atherosclerotic heart disease of native coronary artery without angina pectoris: Secondary | ICD-10-CM

## 2017-04-21 DIAGNOSIS — I482 Chronic atrial fibrillation, unspecified: Secondary | ICD-10-CM

## 2017-04-21 NOTE — Telephone Encounter (Signed)
Patient called asking for a refill on spironolactone and isosorbide and eliquis. Please send to [facility]

## 2017-04-21 NOTE — Telephone Encounter (Signed)
Pt is a 62 yr old male. Last weight was 102.5 Kg 11/05/16. SCr was 1.53 on 01/21/17. Will refill Eliquis 5mg  BID. Pt has next 67mth appt on 04/22/17 for repeat labs as we are following pt closely per Dr Acie Fredrickson.

## 2017-04-22 ENCOUNTER — Other Ambulatory Visit: Payer: Self-pay | Admitting: Internal Medicine

## 2017-04-22 ENCOUNTER — Ambulatory Visit: Payer: Medicare Other | Admitting: *Deleted

## 2017-04-22 DIAGNOSIS — I2510 Atherosclerotic heart disease of native coronary artery without angina pectoris: Secondary | ICD-10-CM

## 2017-04-22 DIAGNOSIS — I4891 Unspecified atrial fibrillation: Secondary | ICD-10-CM

## 2017-04-22 MED ORDER — ISOSORBIDE MONONITRATE ER 60 MG PO TB24: 60.0000 mg | ORAL_TABLET | Freq: Every day | ORAL | 0 refills | Status: DC

## 2017-04-22 NOTE — Telephone Encounter (Signed)
He has already been given courtesy fills. He will need provider approval. Will forward to Dr. Margarita Rana who is covering for Dr. Wynetta Emery

## 2017-04-22 NOTE — Progress Notes (Signed)
Pt was started on Eliquis for Afib on 03/27/2016.    Reviewed patients medication list.  Pt is not  currently on any combined P-gp and strong CYP3A4 inhibitors/inducers (ketoconazole, traconazole, ritonavir, carbamazepine, phenytoin, rifampin, St. John's wort).  Reviewed labs: SCr-1.56, Hgb-13.5, HCT-42.1, Weight 103.09 Kg, Age 62 yrs old.  Dose appropriate based on age, weight, and SCr.  Hgb and HCT within normal limits.    A full discussion of the nature of anticoagulants has been carried out.  A benefit/risk analysis has been presented to the patient, so that they understand the justification for choosing anticoagulation with Eliquis at this time.  The need for compliance is stressed.  Pt is aware to take the medication twice daily.  Side effects of potential bleeding are discussed, including unusual colored urine or stools, coughing up blood or coffee ground emesis, nose bleeds or serious fall or head trauma.  Discussed signs and symptoms of stroke. The patient should avoid any OTC items containing aspirin or ibuprofen.  Avoid alcohol consumption.   Call if any signs of abnormal bleeding.  Discussed financial obligations and resolved any difficulty in obtaining medication.  Next lab test in 3 months  per previous communication with Dr. Acie Fredrickson to follow pt closely (Intracranial hemorrhage-on coumadin)- will watch very closely while on Eliquis.  04/23/17-labs reviewed & spoke with pt. He is aware to continue taking the Eliquis 5mg  twice a day and to call with any issues. Also, pt reminded that he has an appt in 3 months on 07/30/17 for Eliquis f/u and Cardiologist appt at [facility], Lauralyn Primes, RN

## 2017-04-23 LAB — BASIC METABOLIC PANEL
BUN/Creatinine Ratio: 17 (ref 10–24)
BUN: 26 mg/dL (ref 8–27)
CALCIUM: 10 mg/dL (ref 8.6–10.2)
CHLORIDE: 101 mmol/L (ref 96–106)
CO2: 23 mmol/L (ref 20–29)
Creatinine, Ser: 1.56 mg/dL — ABNORMAL HIGH (ref 0.76–1.27)
GFR calc Af Amer: 54 mL/min/{1.73_m2} — ABNORMAL LOW (ref >59)
GFR calc non Af Amer: 47 mL/min/{1.73_m2} — ABNORMAL LOW (ref >59)
GLUCOSE: 104 mg/dL — AB (ref 65–99)
POTASSIUM: 5.2 mmol/L (ref 3.5–5.2)
Sodium: 140 mmol/L (ref 134–144)

## 2017-04-23 LAB — CBC
HEMOGLOBIN: 13.5 g/dL (ref 13.0–17.7)
Hematocrit: 42.1 % (ref 37.5–51.0)
MCH: 29.7 pg (ref 26.6–33.0)
MCHC: 32.1 g/dL (ref 31.5–35.7)
MCV: 93 fL (ref 79–97)
Platelets: 261 10*3/uL (ref 150–379)
RBC: 4.54 x10E6/uL (ref 4.14–5.80)
RDW: 14.7 % (ref 12.3–15.4)
WBC: 6.3 10*3/uL (ref 3.4–10.8)

## 2017-04-23 NOTE — Telephone Encounter (Signed)
He needs an office visit.  Looks like cardiology ordered his refills on 04/21/17.

## 2017-04-26 ENCOUNTER — Other Ambulatory Visit: Payer: Self-pay

## 2017-04-26 DIAGNOSIS — I2510 Atherosclerotic heart disease of native coronary artery without angina pectoris: Secondary | ICD-10-CM

## 2017-05-04 ENCOUNTER — Ambulatory Visit: Payer: Medicare Other | Attending: Internal Medicine | Admitting: Internal Medicine

## 2017-05-04 VITALS — BP 112/73 | HR 77 | Temp 97.5°F | Ht 71.0 in | Wt 223.6 lb

## 2017-05-04 DIAGNOSIS — E118 Type 2 diabetes mellitus with unspecified complications: Secondary | ICD-10-CM | POA: Diagnosis not present

## 2017-05-04 DIAGNOSIS — Z79899 Other long term (current) drug therapy: Secondary | ICD-10-CM | POA: Diagnosis not present

## 2017-05-04 DIAGNOSIS — N183 Chronic kidney disease, stage 3 unspecified: Secondary | ICD-10-CM | POA: Insufficient documentation

## 2017-05-04 DIAGNOSIS — Z833 Family history of diabetes mellitus: Secondary | ICD-10-CM | POA: Insufficient documentation

## 2017-05-04 DIAGNOSIS — I480 Paroxysmal atrial fibrillation: Secondary | ICD-10-CM | POA: Insufficient documentation

## 2017-05-04 DIAGNOSIS — I130 Hypertensive heart and chronic kidney disease with heart failure and stage 1 through stage 4 chronic kidney disease, or unspecified chronic kidney disease: Secondary | ICD-10-CM | POA: Diagnosis not present

## 2017-05-04 DIAGNOSIS — M109 Gout, unspecified: Secondary | ICD-10-CM | POA: Diagnosis not present

## 2017-05-04 DIAGNOSIS — E7800 Pure hypercholesterolemia, unspecified: Secondary | ICD-10-CM | POA: Diagnosis not present

## 2017-05-04 DIAGNOSIS — Z23 Encounter for immunization: Secondary | ICD-10-CM | POA: Diagnosis not present

## 2017-05-04 DIAGNOSIS — I6930 Unspecified sequelae of cerebral infarction: Secondary | ICD-10-CM | POA: Diagnosis not present

## 2017-05-04 DIAGNOSIS — Z87891 Personal history of nicotine dependence: Secondary | ICD-10-CM | POA: Diagnosis not present

## 2017-05-04 DIAGNOSIS — E119 Type 2 diabetes mellitus without complications: Secondary | ICD-10-CM | POA: Diagnosis present

## 2017-05-04 DIAGNOSIS — I69351 Hemiplegia and hemiparesis following cerebral infarction affecting right dominant side: Secondary | ICD-10-CM | POA: Insufficient documentation

## 2017-05-04 DIAGNOSIS — K2100 Gastro-esophageal reflux disease with esophagitis, without bleeding: Secondary | ICD-10-CM

## 2017-05-04 DIAGNOSIS — I2510 Atherosclerotic heart disease of native coronary artery without angina pectoris: Secondary | ICD-10-CM | POA: Insufficient documentation

## 2017-05-04 DIAGNOSIS — E1122 Type 2 diabetes mellitus with diabetic chronic kidney disease: Secondary | ICD-10-CM | POA: Diagnosis not present

## 2017-05-04 DIAGNOSIS — Z7984 Long term (current) use of oral hypoglycemic drugs: Secondary | ICD-10-CM | POA: Insufficient documentation

## 2017-05-04 DIAGNOSIS — E785 Hyperlipidemia, unspecified: Secondary | ICD-10-CM | POA: Insufficient documentation

## 2017-05-04 DIAGNOSIS — Z7901 Long term (current) use of anticoagulants: Secondary | ICD-10-CM | POA: Insufficient documentation

## 2017-05-04 DIAGNOSIS — I5032 Chronic diastolic (congestive) heart failure: Secondary | ICD-10-CM | POA: Diagnosis not present

## 2017-05-04 DIAGNOSIS — I10 Essential (primary) hypertension: Secondary | ICD-10-CM

## 2017-05-04 HISTORY — DX: Chronic kidney disease, stage 3 unspecified: N18.30

## 2017-05-04 HISTORY — DX: Unspecified sequelae of cerebral infarction: I69.30

## 2017-05-04 LAB — POCT GLYCOSYLATED HEMOGLOBIN (HGB A1C): HEMOGLOBIN A1C: 6.6

## 2017-05-04 LAB — GLUCOSE, POCT (MANUAL RESULT ENTRY): POC GLUCOSE: 102 mg/dL — AB (ref 70–99)

## 2017-05-04 MED ORDER — PANTOPRAZOLE SODIUM 40 MG PO TBEC: DELAYED_RELEASE_TABLET | ORAL | 3 refills | Status: DC

## 2017-05-04 MED ORDER — SPIRONOLACTONE 25 MG PO TABS: 25.0000 mg | ORAL_TABLET | Freq: Every day | ORAL | 3 refills | Status: DC

## 2017-05-04 MED ORDER — HYDRALAZINE HCL 50 MG PO TABS: 50.0000 mg | ORAL_TABLET | Freq: Two times a day (BID) | ORAL | 3 refills | Status: DC

## 2017-05-04 MED ORDER — ISOSORBIDE MONONITRATE ER 60 MG PO TB24: 60.0000 mg | ORAL_TABLET | Freq: Every day | ORAL | 3 refills | Status: DC

## 2017-05-04 MED ORDER — DILTIAZEM HCL ER COATED BEADS 180 MG PO CP24
ORAL_CAPSULE | ORAL | 0 refills | Status: DC
Start: 2017-05-04 — End: 2017-06-23

## 2017-05-04 MED ORDER — LISINOPRIL 20 MG PO TABS: 20.0000 mg | ORAL_TABLET | Freq: Two times a day (BID) | ORAL | 3 refills | Status: DC

## 2017-05-04 MED ORDER — PRAVASTATIN SODIUM 20 MG PO TABS: 20.0000 mg | ORAL_TABLET | Freq: Every morning | ORAL | 3 refills | Status: DC

## 2017-05-04 MED ORDER — CARVEDILOL 25 MG PO TABS: 25.0000 mg | ORAL_TABLET | Freq: Two times a day (BID) | ORAL | 3 refills | Status: DC

## 2017-05-04 MED ORDER — METFORMIN HCL 850 MG PO TABS: ORAL_TABLET | ORAL | 3 refills | Status: DC

## 2017-05-04 MED ORDER — TETANUS-DIPHTH-ACELL PERTUSSIS 5-2.5-18.5 LF-MCG/0.5 IM SUSP: 0.5000 mL | Freq: Once | INTRAMUSCULAR | 0 refills | Status: AC

## 2017-05-04 NOTE — Progress Notes (Signed)
Patient ID: [name], male    DOB: 27-Oct-1955  MRN: [id number]  CC: Establish Care and Diabetes   Subjective: [name] is a 62 y.o. male who presents for chronic disease management and to establish care with me as PCP.  Last seen over a year ago. His concerns today include:  Patient with history of A. fib, CAD, diastolic CHF (cardiologist Dr. Cathie Olden), DM, HTN, HL, ICH with residual right-sided weakness, CKD stage III  1.  CAD/HTN/a.fib/dCHF: Had a home visit from nurse associated with his insurance.  Told to ask about his blood pressure if it is not too low whether he is on too many medications. No CP/SOB/palpitations/dizziness with position changes.  Edema LE sometimes.  -no bruising or bleeding on Eliquis Does not have medicines with him today but I went through his med list and he reports compliance.  The only one that he is taking different is the hydralazine where he is supposed to be taking to 2 1/4 tablets twice a day of the 50 mg tabs.  He reportedly takes 1 tablet in the morning and 1-1/4 tablet at night  2.  DM: checks BS but not often. Compliant with Metformin Eating habits: good. Avoids sweets Last eye exam: was 1 yr with Dr. Schuyler [name].  He will schedule f/u appt Denies any numbness or tingling in the feet.  3.  CKD stage 3:  No NSAIDS Still urinates well. Lower extremity edema  4.  Hx CVA/ICH: Lives alone and independent in ADLS No falls.  Ambulates with rolling walker  Patient Active Problem List   Diagnosis Date Noted  . GERD (gastroesophageal reflux disease) 02/08/2015  . HTN (hypertension) 07/01/2012  . ICH (intracerebral hemorrhage) ([place]) 12/22/2010  . Hyperlipemia 11/21/2010  . Gout 11/20/2010  . CAD (coronary artery disease) 11/20/2010  . Stroke ([place]) 11/19/2010  . Diabetes type 2, controlled ([place]) 11/19/2010  . A-fib ([place]) 11/19/2010     Current Outpatient Medications on File Prior to Visit  Medication Sig Dispense Refill  . Alcohol Swabs  (ALCOHOL PREP) 70 % PADS     . ASSURE COMFORT LANCETS 30G MISC     . Blood Glucose Calibration (CARESENS CONTROL A) SOLN     . Blood Glucose Monitoring Suppl ([place]) DEVI     . CARESENS N GLUCOSE TEST test strip     . ELIQUIS 5 MG TABS tablet TAKE 1 TABLET BY MOUTH TWO  TIMES DAILY 180 tablet 1  . glucose monitoring kit (FREESTYLE) monitoring kit 1 each by Does not apply route 4 (four) times daily - after meals and at bedtime. 1 month Diabetic Testing Supplies for QAC-QHS accuchecks. 1 each 1  . ketoconazole (NIZORAL) 2 % cream Apply 1 application topically 2 (two) times daily. To neck rash 30 g 0  . Lancet Devices (ADJUSTABLE LANCING DEVICE) MISC     . furosemide (LASIX) 20 MG tablet 1 daily prn for leg swelling.  If taking more than 1-2 times per week, please schedule an appointment (Patient not taking: Reported on 05/04/2017) 30 tablet 2  . [DISCONTINUED] insulin glargine (LANTUS) 100 UNIT/ML injection Inject 0.15 mLs (15 Units total) into the skin at bedtime. 12 mL 1  . [DISCONTINUED] potassium chloride (KLOR-CON M15) 15 MEQ tablet Take 2 tablets (30 mEq total) by mouth 2 (two) times daily. 120 tablet 1  . [DISCONTINUED] rosuvastatin (CRESTOR) 5 MG tablet Place 1 tablet (5 mg total) into feeding tube daily at 6 PM. 30 tablet  2   No current facility-administered medications on file prior to visit.     No Known Allergies  Social History   Socioeconomic History  . Marital status: Single    Spouse name: Not on file  . Number of children: 0  . Years of education: Not on file  . Highest education level: Not on file  Occupational History  . Occupation: Disability  Social Needs  . Financial resource strain: Not on file  . Food insecurity:    Worry: Not on file    Inability: Not on file  . Transportation needs:    Medical: Not on file    Non-medical: Not on file  Tobacco Use  . Smoking status: Former Smoker    Types: Cigarettes    Last attempt to quit: 12/19/2005      Years since quitting: 11.3  . Smokeless tobacco: Never Used  Substance and Sexual Activity  . Alcohol use: No    Alcohol/week: 6.6 oz    Types: 6 Glasses of wine, 5 Shots of liquor per week    Comment: quit in 2012  . Drug use: No  . Sexual activity: Not on file  Lifestyle  . Physical activity:    Days per week: Not on file    Minutes per session: Not on file  . Stress: Not on file  Relationships  . Social connections:    Talks on phone: Not on file    Gets together: Not on file    Attends religious service: Not on file    Active member of club or organization: Not on file    Attends meetings of clubs or organizations: Not on file    Relationship status: Not on file  . Intimate partner violence:    Fear of current or ex partner: Not on file    Emotionally abused: Not on file    Physically abused: Not on file    Forced sexual activity: Not on file  Other Topics Concern  . Not on file  Social History Narrative  . Not on file    Family History  Problem Relation Age of Onset  . Diabetes Mother   . Diabetes Brother   . Colon cancer Neg Hx   . Colon polyps Neg Hx   . Kidney disease Neg Hx   . Esophageal cancer Neg Hx   . Heart disease Neg Hx   . Gallbladder disease Neg Hx     Past Surgical History:  Procedure Laterality Date  . PEG PLACEMENT  12/03/2010   Procedure: PERCUTANEOUS ENDOSCOPIC GASTROSTOMY (PEG) PLACEMENT;  Surgeon: Lafayette Dragon, MD;  Location: [facility] ENDOSCOPY;  Service: Endoscopy;  Laterality: N/A;  . TRACHEOSTOMY TUBE PLACEMENT  11/28/2010   Procedure: TRACHEOSTOMY;  Surgeon: Beckie Salts, MD;  Location: [facility] OR;  Service: ENT;  Laterality: N/A;    ROS: Review of Systems Neg except as above PHYSICAL EXAM: BP 112/73   Pulse 77   Temp (!) 97.5 F (36.4 C) (Oral)   Ht _0  (1.803 m)   Wt 223 lb 9.6 oz (101.4 kg)   SpO2 97%   BMI 31.19 kg/m   Wt Readings from Last 3 Encounters:  05/04/17 223 lb 9.6 oz (101.4 kg)  11/05/16 226 lb (102.5 kg)   03/27/16 217 lb 12.8 oz (98.8 kg)    Physical Exam   General appearance - alert, well appearing, middle-aged African-American male and in no distress Mental status -she is oriented to person, place and time.  He answers questions appropriately Eyes - pupils equal and reactive, extraocular eye movements intact Mouth - mucous membranes moist, pharynx normal without lesions Neck - supple, no significant adenopathy Chest - clear to auscultation, no wheezes, rales or rhonchi, symmetric air entry Heart -irregularly irregular but rate controlled Extremities - 1+ LE edema Diabetic Foot Exam - Simple   Simple Foot Form Visual Inspection No deformities, no ulcerations, no other skin breakdown bilaterally:  Yes Sensation Testing Intact to touch and monofilament testing bilaterally:  Yes Pulse Check Posterior Tibialis and Dorsalis pulse intact bilaterally:  Yes Comments      Results for orders placed or performed in visit on 05/04/17  POCT glucose (manual entry)  Result Value Ref Range   POC Glucose 102 (A) 70 - 99 mg/dl  POCT glycosylated hemoglobin (Hb A1C)  Result Value Ref Range   Hemoglobin A1C 6.6    Lab Results  Component Value Date   WBC 6.3 04/22/2017   HGB 13.5 04/22/2017   HCT 42.1 04/22/2017   MCV 93 04/22/2017   PLT 261 04/22/2017     Chemistry      Component Value Date/Time   NA 140 04/22/2017 1339   K 5.2 04/22/2017 1339   CL 101 04/22/2017 1339   CO2 23 04/22/2017 1339   BUN 26 04/22/2017 1339   CREATININE 1.56 (H) 04/22/2017 1339   CREATININE 1.24 02/07/2016 1152      Component Value Date/Time   CALCIUM 10.0 04/22/2017 1339   ALKPHOS 44 02/07/2016 1152   AST 12 02/07/2016 1152   ALT 9 02/07/2016 1152   BILITOT 0.3 02/07/2016 1152      ASSESSMENT AND PLAN: 1. Controlled type 2 diabetes mellitus with complication, without long-term current use of insulin (HCC) Continue healthy eating habits and Metformin. eGFR still above 45.  - POCT glucose  (manual entry) - POCT glycosylated hemoglobin (Hb A1C) - Microalbumin / creatinine urine ratio - metFORMIN (GLUCOPHAGE) 850 MG tablet; TAKE 1 TABLET BY MOUTH TWO  TIMES DAILY WITH MEALS  Dispense: 180 tablet; Refill: 3  2. Paroxysmal atrial fibrillation (HCC) He is rate controlled.  Denies any palpitations. - diltiazem (CARDIZEM CD) 180 MG 24 hr capsule; TAKE 2 CAPSULES BY MOUTH  DAILY AT NOON.  Dispense: 60 capsule; Refill: 0  3. Coronary artery disease involving native coronary artery of native heart without angina pectoris Stable without complaints of chest pains or shortness of breath - carvedilol (COREG) 25 MG tablet; Take 1 tablet (25 mg total) by mouth 2 (two) times daily with a meal.  Dispense: 180 tablet; Refill: 3 - lisinopril (PRINIVIL,ZESTRIL) 20 MG tablet; Take 1 tablet (20 mg total) by mouth 2 (two) times daily.  Dispense: 180 tablet; Refill: 3 - spironolactone (ALDACTONE) 25 MG tablet; Take 1 tablet (25 mg total) by mouth daily.  Dispense: 90 tablet; Refill: 3 - isosorbide mononitrate (IMDUR) 60 MG 24 hr tablet; Take 1 tablet (60 mg total) by mouth daily.  Dispense: 90 tablet; Refill: 3  4. Gastroesophageal reflux disease with esophagitis - pantoprazole (PROTONIX) 40 MG tablet; TAKE 1 TABLET BY MOUTH  DAILY AT 12 NOON.  Dispense: 90 tablet; Refill: 3  5. Pure hypercholesterolemia - Lipid panel - Hepatic Function Panel - pravastatin (PRAVACHOL) 20 MG tablet; Take 1 tablet (20 mg total) by mouth every morning.  Dispense: 90 tablet; Refill: 3  6. Essential hypertension At goal.  He is asymptomatic with no dizziness.  Only change have made in his blood pressure medications as the hydralazine  which was changed to 50 mg 1 tablet twice daily - hydrALAZINE (APRESOLINE) 50 MG tablet; Take 1 tablet (50 mg total) by mouth 2 (two) times daily.  Dispense: 180 tablet; Refill: 3 - lisinopril (PRINIVIL,ZESTRIL) 20 MG tablet; Take 1 tablet (20 mg total) by mouth 2 (two) times daily.   Dispense: 180 tablet; Refill: 3  7. History of cerebrovascular accident (CVA) with residual deficit   8. Need for Tdap vaccination - Tdap vaccine greater than or equal to 7yo IM  Patient was given the opportunity to ask questions.  Patient verbalized understanding of the plan and was able to repeat key elements of the plan.   Orders Placed This Encounter  Procedures  . Microalbumin / creatinine urine ratio  . Lipid panel  . Hepatic Function Panel  . POCT glucose (manual entry)  . POCT glycosylated hemoglobin (Hb A1C)     Requested Prescriptions   Signed Prescriptions Disp Refills  . carvedilol (COREG) 25 MG tablet 180 tablet 3    Sig: Take 1 tablet (25 mg total) by mouth 2 (two) times daily with a meal.  . diltiazem (CARDIZEM CD) 180 MG 24 hr capsule 60 capsule 0    Sig: TAKE 2 CAPSULES BY MOUTH  DAILY AT NOON.  . hydrALAZINE (APRESOLINE) 50 MG tablet 180 tablet 3    Sig: Take 1 tablet (50 mg total) by mouth 2 (two) times daily.  . metFORMIN (GLUCOPHAGE) 850 MG tablet 180 tablet 3    Sig: TAKE 1 TABLET BY MOUTH TWO  TIMES DAILY WITH MEALS  . pantoprazole (PROTONIX) 40 MG tablet 90 tablet 3    Sig: TAKE 1 TABLET BY MOUTH  DAILY AT 12 NOON.  Marland Kitchen pravastatin (PRAVACHOL) 20 MG tablet 90 tablet 3    Sig: Take 1 tablet (20 mg total) by mouth every morning.  Marland Kitchen lisinopril (PRINIVIL,ZESTRIL) 20 MG tablet 180 tablet 3    Sig: Take 1 tablet (20 mg total) by mouth 2 (two) times daily.  Marland Kitchen spironolactone (ALDACTONE) 25 MG tablet 90 tablet 3    Sig: Take 1 tablet (25 mg total) by mouth daily.  . isosorbide mononitrate (IMDUR) 60 MG 24 hr tablet 90 tablet 3    Sig: Take 1 tablet (60 mg total) by mouth daily.  . Tdap (BOOSTRIX) 5-2.5-18.5 LF-MCG/0.5 injection 0.5 mL 0    Sig: Inject 0.5 mLs into the muscle once for 1 dose.    Return in about 3 months (around 08/03/2017).  Karle Plumber, MD, FACP

## 2017-05-04 NOTE — Patient Instructions (Signed)
Please call and schedule your routine eye exam with Dr. Schuyler Amor.

## 2017-05-05 ENCOUNTER — Telehealth: Payer: Self-pay

## 2017-05-05 LAB — HEPATIC FUNCTION PANEL
ALT: 6 IU/L (ref 0–44)
AST: 12 IU/L (ref 0–40)
Albumin: 4.7 g/dL (ref 3.6–4.8)
Alkaline Phosphatase: 54 IU/L (ref 39–117)
Bilirubin Total: 0.3 mg/dL (ref 0.0–1.2)
Bilirubin, Direct: 0.09 mg/dL (ref 0.00–0.40)
TOTAL PROTEIN: 7.4 g/dL (ref 6.0–8.5)

## 2017-05-05 LAB — LIPID PANEL
CHOLESTEROL TOTAL: 139 mg/dL (ref 100–199)
Chol/HDL Ratio: 2.8 ratio (ref 0.0–5.0)
HDL: 50 mg/dL (ref >39)
LDL CALC: 67 mg/dL (ref 0–99)
Triglycerides: 112 mg/dL (ref 0–149)
VLDL CHOLESTEROL CAL: 22 mg/dL (ref 5–40)

## 2017-05-05 LAB — MICROALBUMIN / CREATININE URINE RATIO
Creatinine, Urine: 129.3 mg/dL
MICROALB/CREAT RATIO: 3.1 mg/g{creat} (ref 0.0–30.0)
Microalbumin, Urine: 4 ug/mL

## 2017-05-05 NOTE — Telephone Encounter (Signed)
Contacted pt to go over lab results pt didn't answer left a detailed vm informing pt of results and if he has any questions or concerns to give me a call  If pt calls back please give results:  cholesterol levels are normal. Liver function tests normal.

## 2017-05-12 ENCOUNTER — Other Ambulatory Visit: Payer: Self-pay | Admitting: Internal Medicine

## 2017-05-12 DIAGNOSIS — I480 Paroxysmal atrial fibrillation: Secondary | ICD-10-CM

## 2017-06-23 ENCOUNTER — Other Ambulatory Visit: Payer: Self-pay | Admitting: Internal Medicine

## 2017-06-23 DIAGNOSIS — I480 Paroxysmal atrial fibrillation: Secondary | ICD-10-CM

## 2017-07-21 ENCOUNTER — Other Ambulatory Visit: Payer: Self-pay | Admitting: Internal Medicine

## 2017-07-21 DIAGNOSIS — I480 Paroxysmal atrial fibrillation: Secondary | ICD-10-CM

## 2017-07-21 NOTE — Telephone Encounter (Signed)
Patient had 30 day refill approved last month. Sent in by [facility] on 06/23/17. Last seen by Dr. Wynetta Emery 05/04/17.  Pt had appointment on 08/05/17 canceled. Will route request to Dr. Wynetta Emery.

## 2017-07-30 ENCOUNTER — Ambulatory Visit: Payer: Medicare Other | Admitting: Cardiovascular Disease

## 2017-07-30 ENCOUNTER — Ambulatory Visit (INDEPENDENT_AMBULATORY_CARE_PROVIDER_SITE_OTHER): Payer: Medicare Other | Admitting: Pharmacist

## 2017-07-30 ENCOUNTER — Encounter: Payer: Self-pay | Admitting: Cardiovascular Disease

## 2017-07-30 VITALS — BP 108/84 | HR 75 | Ht 71.0 in | Wt 223.4 lb

## 2017-07-30 DIAGNOSIS — I482 Chronic atrial fibrillation, unspecified: Secondary | ICD-10-CM

## 2017-07-30 DIAGNOSIS — I5032 Chronic diastolic (congestive) heart failure: Secondary | ICD-10-CM | POA: Diagnosis not present

## 2017-07-30 DIAGNOSIS — I10 Essential (primary) hypertension: Secondary | ICD-10-CM | POA: Diagnosis not present

## 2017-07-30 MED ORDER — FUROSEMIDE 20 MG PO TABS: 40.0000 mg | ORAL_TABLET | Freq: Every day | ORAL | 3 refills | Status: DC

## 2017-07-30 NOTE — Progress Notes (Signed)
Cardiology Office Note   Date:  07/30/2017   ID:  [name], [name] 09-24-1955, MRN [id number]  PCP:  Ladell Pier, MD  Cardiologist:   Mertie Moores, MD  Former patient of Dr. Verl Blalock   Chief Complaint  Patient presents with  . Atrial Fibrillation   Problem List 1. Atrial fib  2. Chronic diastolic CHF 3. Essential HTN 4. Intracranial hemorrhage ( on coumadin )  5. DM 6. COPD      Previous notes:  [name] is a 62 y.o. male who presents for further evaluation of paroxysmal atrial fibrillation.  He was last seen by Dr. Verl Blalock in Dec. 2014.  No CP recently, no dyspnea  Has better fine motor control in the left side of his body   Able to walk with a walker,  Was seen today in a wheelchair.   March 27, 2016:  Now using a walker ( was in a wheelchair at his last visit )  No CP or dyspnea  BP remains low   July 30, 2017:    Follow up for atrial fib .   Has a history of chronic diastolic congestive heart failure.  Also has a history of hypertension, hyperlipidemia.  Is on PRN lasix - is not on potassium  . Breathing is ok .   Legs are very swollen     Past Medical History:  Diagnosis Date  . A-fib ([place]) 11/19/2010  . Acute exacerbation of congestive heart failure ([place]) 11/19/2010  . Arthritis   . CAD (coronary artery disease) 11/20/2010  . CHF (congestive heart failure) ([place])   . Coronary artery disease   . Diabetes mellitus   . Diabetes mellitus 11/19/2010  . Gout   . Hyperlipemia 11/21/2010  . Hypertension   . Hypertensive emergency 11/19/2010  . ICH (intracerebral hemorrhage) ([place]) 12/22/2010  . Physical deconditioning 12/22/2010  . Pulmonary edema 11/19/2010  . Respiratory failure ([place]) 11/19/2010  . Shortness of breath   . Stroke ([place]) 11/19/2010  . Thyroiditis 11/20/2010    Past Surgical History:  Procedure Laterality Date  . PEG PLACEMENT  12/03/2010   Procedure: PERCUTANEOUS ENDOSCOPIC GASTROSTOMY (PEG) PLACEMENT;  Surgeon: Lafayette Dragon,  MD;  Location: [facility] ENDOSCOPY;  Service: Endoscopy;  Laterality: N/A;  . TRACHEOSTOMY TUBE PLACEMENT  11/28/2010   Procedure: TRACHEOSTOMY;  Surgeon: Beckie Salts, MD;  Location: [facility] OR;  Service: ENT;  Laterality: N/A;     Current Outpatient Medications  Medication Sig Dispense Refill  . Alcohol Swabs (ALCOHOL PREP) 70 % PADS     . ASSURE COMFORT LANCETS 30G MISC     . Blood Glucose Calibration (CARESENS CONTROL A) SOLN     . Blood Glucose Monitoring Suppl ([place]) DEVI     . CARESENS N GLUCOSE TEST test strip     . carvedilol (COREG) 25 MG tablet Take 1 tablet (25 mg total) by mouth 2 (two) times daily with a meal. 180 tablet 3  . diltiazem (CARDIZEM CD) 180 MG 24 hr capsule TAKE 2 CAPSULES BY MOUTH  DAILY AT NOON. 60 capsule 4  . ELIQUIS 5 MG TABS tablet TAKE 1 TABLET BY MOUTH TWO  TIMES DAILY 180 tablet 1  . furosemide (LASIX) 20 MG tablet 1 daily prn for leg swelling.  If taking more than 1-2 times per week, please schedule an appointment 30 tablet 2  . glucose monitoring kit (FREESTYLE) monitoring kit 1 each by Does not apply route 4 (four) times  daily - after meals and at bedtime. 1 month Diabetic Testing Supplies for QAC-QHS accuchecks. 1 each 1  . hydrALAZINE (APRESOLINE) 50 MG tablet Take 1 tablet (50 mg total) by mouth 2 (two) times daily. 180 tablet 3  . isosorbide mononitrate (IMDUR) 60 MG 24 hr tablet Take 1 tablet (60 mg total) by mouth daily. 90 tablet 3  . Lancet Devices (ADJUSTABLE LANCING DEVICE) MISC     . lisinopril (PRINIVIL,ZESTRIL) 20 MG tablet Take 1 tablet (20 mg total) by mouth 2 (two) times daily. 180 tablet 3  . metFORMIN (GLUCOPHAGE) 850 MG tablet TAKE 1 TABLET BY MOUTH TWO  TIMES DAILY WITH MEALS 180 tablet 3  . pantoprazole (PROTONIX) 40 MG tablet TAKE 1 TABLET BY MOUTH  DAILY AT 12 NOON. 90 tablet 3  . pravastatin (PRAVACHOL) 20 MG tablet Take 1 tablet (20 mg total) by mouth every morning. 90 tablet 3  . spironolactone (ALDACTONE) 25 MG  tablet Take 1 tablet (25 mg total) by mouth daily. 90 tablet 3   No current facility-administered medications for this visit.     Allergies:   Patient has no known allergies.    Social History:  The patient  reports that he quit smoking about 11 years ago. His smoking use included cigarettes. He has never used smokeless tobacco. He reports that he does not drink alcohol or use drugs.   Family History:  The patient's family history includes Diabetes in his brother and mother.    ROS: Noted in current history, otherwise review of systems is negative.    Physical Exam: Blood pressure 108/84, pulse 75, height 5' 11"  (1.803 m), weight 223 lb 6.4 oz (101.3 kg), SpO2 98 %.  GEN:  Well nourished, well developed in no acute distress HEENT: Normal NECK: No JVD; No carotid bruits LYMPHATICS: No lymphadenopathy CARDIAC:  Irregl . Irreg.  RESPIRATORY:  Clear to auscultation without rales, wheezing or rhonchi  ABDOMEN: Soft, non-tender, non-distended MUSCULOSKELETAL:   3+ bilateral edema  SKIN: Warm and dry NEUROLOGIC:  Weakness on right side.   Altered speech pattern c/w CVA    EKG: July 30, 2017: Atrial fibrillation with a heart rate of 75.  Minimal voltage criteria for left ventricular hypertrophy.   Recent Labs: 04/22/2017: BUN 26; Creatinine, Ser 1.56; Hemoglobin 13.5; Platelets 261; Potassium 5.2; Sodium 140 05/04/2017: ALT 6    Lipid Panel    Component Value Date/Time   CHOL 139 05/04/2017 1433   TRIG 112 05/04/2017 1433   HDL 50 05/04/2017 1433   CHOLHDL 2.8 05/04/2017 1433   CHOLHDL 2.4 02/07/2016 1152   VLDL 25 02/07/2016 1152   LDLCALC 67 05/04/2017 1433      Wt Readings from Last 3 Encounters:  07/30/17 223 lb 6.4 oz (101.3 kg)  05/04/17 223 lb 9.6 oz (101.4 kg)  11/05/16 226 lb (102.5 kg)      Other studies Reviewed: Additional studies/ records that were reviewed today include: . Review of the above records demonstrates:    ASSESSMENT AND PLAN:  1.  Atrial fib - he is in chronic atrial fibrillation.  CHADS2VASC is 2 ( HTN, diastolic CHF )   He has a history of an intracranial bleed years ago while on Coumadin. He has been given clearance to start Eliquis.  Seems to be doing well on eliquis      2. Chronic diastolic CHF -   has significant leg edema.  He is on low-dose Lasix which does not seem to be doing much.  We will increase the Lasix to 40 mg a day. Check basic metabolic profile in 2 weeks.  3. Essential HTN -      Blood pressure is actually low today.  We will discontinue the lisinopril.  4. Intracranial hemorrhage ( on coumadin ) -  5. DM 6. COPD    Current medicines are reviewed at length with the patient today.  The patient does not have concerns regarding medicines.  The following changes have been made:  no change  Labs/ tests ordered today include:  No orders of the defined types were placed in this encounter.   Disposition:   FU with [place] in 3 months    Mertie Moores, MD  07/30/2017 3:00 PM    [address], [place], [place]  [address] Phone: [phone]; Fax: [phone]

## 2017-07-30 NOTE — Progress Notes (Addendum)
Pt was started on Eliquis for Afib on March 16th, 2018.    Reviewed patients medication list.  Pt is not currently on any combined P-gp and strong CYP3A4 inhibitors/inducers (ketoconazole, traconazole, ritonavir, carbamazepine, phenytoin, rifampin, St. John's wort).  Reviewed labs.  SCr 1.28, Weight 101.3kg, CrCl- 41ml/min.  Dose appropriate based on age, weight, and SCr.  Hgb and HCT Within Normal Limits  A full discussion of the nature of anticoagulants has been carried out.  A benefit/risk analysis has been presented to the patient, so that they understand the justification for choosing anticoagulation with Eliquis at this time.  The need for compliance is stressed.  Pt is aware to take the medication twice daily.  Side effects of potential bleeding are discussed, including unusual colored urine or stools, coughing up blood or coffee ground emesis, nose bleeds or serious fall or head trauma.  Discussed signs and symptoms of stroke. The patient should avoid any OTC items containing aspirin or ibuprofen.  Avoid alcohol consumption.   Call if any signs of abnormal bleeding.  Discussed financial obligations and resolved any difficulty in obtaining medication.    08/02/17: Scr improved; however, Eliquis 5mg  BID remains appropriate due to age and weight. Will continue current dose and follow up with Dr. Acie Fredrickson as scheduled.

## 2017-07-30 NOTE — Patient Instructions (Signed)
Medication Instructions:  Your physician has recommended you make the following change in your medication:   INCREASE Lasix (Furosemide) to 40 mg once daily STOP Lisinopril   Labwork: Your physician recommends that you return for lab work in: 2 weeks for Basic metabolic panel   Testing/Procedures: None Ordered   Follow-Up: Your physician recommends that you schedule a follow-up appointment in: 3 months with Richardson Dopp, PA   If you need a refill on your cardiac medications before your next appointment, please call your pharmacy.   Thank you for choosing [facility]! Christen Bame, RN [phone]

## 2017-07-31 LAB — CBC
Hematocrit: 40.7 % (ref 37.5–51.0)
Hemoglobin: 13.2 g/dL (ref 13.0–17.7)
MCH: 30.1 pg (ref 26.6–33.0)
MCHC: 32.4 g/dL (ref 31.5–35.7)
MCV: 93 fL (ref 79–97)
Platelets: 283 10*3/uL (ref 150–450)
RBC: 4.38 x10E6/uL (ref 4.14–5.80)
RDW: 14.9 % (ref 12.3–15.4)
WBC: 6.1 10*3/uL (ref 3.4–10.8)

## 2017-07-31 LAB — BASIC METABOLIC PANEL
BUN/Creatinine Ratio: 25 — ABNORMAL HIGH (ref 10–24)
BUN: 32 mg/dL — ABNORMAL HIGH (ref 8–27)
CO2: 21 mmol/L (ref 20–29)
Calcium: 9.6 mg/dL (ref 8.6–10.2)
Chloride: 98 mmol/L (ref 96–106)
Creatinine, Ser: 1.28 mg/dL — ABNORMAL HIGH (ref 0.76–1.27)
GFR calc Af Amer: 68 mL/min/{1.73_m2} (ref >59)
GFR calc non Af Amer: 59 mL/min/{1.73_m2} — ABNORMAL LOW (ref >59)
Glucose: 89 mg/dL (ref 65–99)
Potassium: 4.4 mmol/L (ref 3.5–5.2)
Sodium: 134 mmol/L (ref 134–144)

## 2017-08-05 ENCOUNTER — Ambulatory Visit: Payer: Medicare Other | Admitting: Internal Medicine

## 2017-08-13 ENCOUNTER — Other Ambulatory Visit: Payer: Medicare Other

## 2017-08-25 ENCOUNTER — Other Ambulatory Visit: Payer: Self-pay

## 2017-08-25 NOTE — Patient Outreach (Signed)
[facility]) Care Management  08/25/2017  [name] August 12, 1955 [id number]   Medication Adherence call to [name] left a message for patient to call back patient is due on Lisinopril 20 mg. [name] is showing past due under [place].  [facility] Management Direct Dial [phone]  Fax [phone] [email or web address]

## 2017-09-08 ENCOUNTER — Other Ambulatory Visit: Payer: Self-pay | Admitting: Cardiovascular Disease

## 2017-09-08 DIAGNOSIS — I482 Chronic atrial fibrillation, unspecified: Secondary | ICD-10-CM

## 2017-09-08 NOTE — Telephone Encounter (Signed)
Eliquis 5mg  refill request received; pt is 62 yrs old, wt-101.3kg, Crea-1.28 on 07/30/17, last seen by Dr. Acie Fredrickson on 07/30/17; will send in refill to requested pharmacy.

## 2017-09-23 ENCOUNTER — Other Ambulatory Visit: Payer: Self-pay

## 2017-09-23 NOTE — Patient Outreach (Signed)
[facility]) Care Management  09/23/2017  [name] 06-14-1955 [id number]   Medication Adherence call to [name] left a message for patient to call back pat is due on Lisinopril 20 mg. [name] is showing past due under [place].  [facility] Management Direct Dial [phone]  Fax [phone] [name]

## 2017-10-28 DIAGNOSIS — I5032 Chronic diastolic (congestive) heart failure: Secondary | ICD-10-CM | POA: Insufficient documentation

## 2017-10-28 NOTE — Progress Notes (Signed)
Cardiology Office Note:    Date:  10/29/2017   ID:  Burech, Mcfarland 01/07/1956, MRN [id number]  PCP:  Ladell Pier, MD  Cardiologist:  Mertie Moores, MD   Electrophysiologist:  None   Referring MD: Ladell Pier, MD   Chief Complaint  Patient presents with  . Follow-up    AFib, CHF     History of Present Illness:    [name] is a 62 y.o. male with permanent atrial fibrillation, prior intracerebral hemorrhage on Coumadin, diabetes, hypertension, prior stroke, diastolic heart failure.  His history indicates he has coronary artery disease but there are no records of a Cardiac Catheterization.  He was started on Apixaban in 2018.  He was last seen by Dr. Acie Fredrickson in July 2019.    CHADS2-VASc=4 (CHF, DM, CVA x 2)    [name] returns for follow up.  He is here alone.  He walks with a walker. He has not had any shortness of breath with exertion or chest pain.  He has not had any orthopnea, paroxysmal nocturnal dyspnea.  He notes that his leg swelling is better after laying flat and gets worse throughout the day as he is on his feet or sitting.  He had to change his Lasix to as needed.  He was having difficulty running back and forth to the bathroom.  He has not noticed any difference in taking Lasix QD vs prn.  He weighs infrequently.    Prior CV studies:   The following studies were reviewed today:  Echocardiogram 08/18/2012 Moderate concentric LVH, EF 55-60, normal wall motion, mild MR  Carotid US 08/18/2012 Bilateral ICA 1-39  Past Medical History:  Diagnosis Date  . A-fib ([place]) 11/19/2010  . Acute exacerbation of congestive heart failure ([place]) 11/19/2010  . Arthritis   . CAD (coronary artery disease) 11/20/2010  . CHF (congestive heart failure) ([place])   . CKD (chronic kidney disease) stage 3, GFR 30-59 ml/min (HCC) 05/04/2017  . Coronary artery disease   . Diabetes mellitus   . Diabetes mellitus 11/19/2010  . Diabetes type 2, controlled ([place]) 11/19/2010  . GERD  (gastroesophageal reflux disease) 02/08/2015  . Gout   . Gout 11/20/2010  . History of cerebrovascular accident (CVA) with residual deficit 05/04/2017  . HTN (hypertension) 07/01/2012  . Hyperlipemia 11/21/2010  . Hypertension   . Hypertensive emergency 11/19/2010  . ICH (intracerebral hemorrhage) ([place]) 12/22/2010  . Physical deconditioning 12/22/2010  . Pulmonary edema 11/19/2010  . Respiratory failure ([place]) 11/19/2010  . Shortness of breath   . Stroke ([place]) 11/19/2010  . Thyroiditis 11/20/2010   Surgical Hx: The patient  has a past surgical history that includes Tracheostomy tube placement (11/28/2010) and PEG placement (12/03/2010).   Current Medications: Current Meds  Medication Sig  . Alcohol Swabs (ALCOHOL PREP) 70 % PADS   . ASSURE COMFORT LANCETS 30G MISC   . Blood Glucose Calibration (CARESENS CONTROL A) SOLN   . Blood Glucose Monitoring Suppl ([place]) DEVI   . CARESENS N GLUCOSE TEST test strip   . carvedilol (COREG) 25 MG tablet Take 1 tablet (25 mg total) by mouth 2 (two) times daily with a meal.  . diltiazem (CARDIZEM CD) 180 MG 24 hr capsule TAKE 2 CAPSULES BY MOUTH  DAILY AT NOON.  Marland Kitchen ELIQUIS 5 MG TABS tablet TAKE 1 TABLET BY MOUTH TWO  TIMES DAILY  . furosemide (LASIX) 20 MG tablet Take 2 tablets (40 mg total) by mouth daily. (Patient  taking differently: Take 40 mg by mouth daily as needed for edema. )  . glucose monitoring kit (FREESTYLE) monitoring kit 1 each by Does not apply route 4 (four) times daily - after meals and at bedtime. 1 month Diabetic Testing Supplies for QAC-QHS accuchecks.  . hydrALAZINE (APRESOLINE) 50 MG tablet Take 1 tablet (50 mg total) by mouth 2 (two) times daily.  . isosorbide mononitrate (IMDUR) 60 MG 24 hr tablet Take 1 tablet (60 mg total) by mouth daily.  Elmore Guise Devices (ADJUSTABLE LANCING DEVICE) MISC   . metFORMIN (GLUCOPHAGE) 850 MG tablet TAKE 1 TABLET BY MOUTH TWO  TIMES DAILY WITH MEALS  . pantoprazole (PROTONIX) 40 MG  tablet TAKE 1 TABLET BY MOUTH  DAILY AT 12 NOON.  Marland Kitchen pravastatin (PRAVACHOL) 20 MG tablet Take 1 tablet (20 mg total) by mouth every morning.  Marland Kitchen spironolactone (ALDACTONE) 25 MG tablet Take 1 tablet (25 mg total) by mouth daily.     Allergies:   Patient has no known allergies.   Social History   Tobacco Use  . Smoking status: Former Smoker    Types: Cigarettes    Last attempt to quit: 12/19/2005    Years since quitting: 11.8  . Smokeless tobacco: Never Used  Substance Use Topics  . Alcohol use: No    Alcohol/week: 11.0 standard drinks    Types: 6 Glasses of wine, 5 Shots of liquor per week    Comment: quit in 2012  . Drug use: No     Family Hx: The patient's family history includes Diabetes in his brother and mother. There is no history of Colon cancer, Colon polyps, Kidney disease, Esophageal cancer, Heart disease, or Gallbladder disease.  ROS:   Please see the history of present illness.    Review of Systems  Cardiovascular: Positive for leg swelling.   All other systems reviewed and are negative.   EKGs/Labs/Other Test Reviewed:    EKG:  EKG is not ordered today.    Recent Labs: 05/04/2017: ALT 6 07/30/2017: BUN 32; Creatinine, Ser 1.28; Hemoglobin 13.2; Platelets 283; Potassium 4.4; Sodium 134   Recent Lipid Panel Lab Results  Component Value Date/Time   CHOL 139 05/04/2017 02:33 PM   TRIG 112 05/04/2017 02:33 PM   HDL 50 05/04/2017 02:33 PM   CHOLHDL 2.8 05/04/2017 02:33 PM   CHOLHDL 2.4 02/07/2016 11:52 AM   LDLCALC 67 05/04/2017 02:33 PM    Physical Exam:    VS:  BP 118/81   Pulse 75   Ht _0  (1.803 m)   Wt 222 lb (100.7 kg)   SpO2 96%   BMI 30.96 kg/m     Wt Readings from Last 3 Encounters:  10/29/17 222 lb (100.7 kg)  07/30/17 223 lb 6.4 oz (101.3 kg)  05/04/17 223 lb 9.6 oz (101.4 kg)     Physical Exam  Constitutional: He is oriented to person, place, and time. He appears well-developed and well-nourished. No distress.  HENT:  Head:  Normocephalic and atraumatic.  Eyes: No scleral icterus.  Neck: Neck supple. No thyromegaly present.  Cardiovascular: Normal rate. An irregularly irregular rhythm present.  Murmur heard. Pulmonary/Chest: Effort normal. He has no wheezes. He has no rales.  Abdominal: Soft. He exhibits no distension.  Musculoskeletal: He exhibits edema (1-2+ bilat LE edema).  Lymphadenopathy:    He has no cervical adenopathy.  Neurological: He is alert and oriented to person, place, and time.  Skin: Skin is warm and dry.  Psychiatric: He has a  normal mood and affect.    ASSESSMENT & PLAN:    Permanent atrial fibrillation Rate is controlled.  He is tolerating Apixaban.  He has not noticed any bleeding.  Hgb and Creatinine were ok in 07/2017.  Continue current therapy.  Chronic diastolic CHF (congestive heart failure) (HCC) Volume stable.  He continues to have lower extremity swelling.  He does note improvement after lying supine. I suspect he may have an element of venous insufficiency.  We discussed weighing daily and when to take lasix.  I have also encouraged him to use compression stockings to help with his swelling.  Essential hypertension The patient's blood pressure is controlled on his current regimen.  Continue current therapy.    Hemiparesis due to old stroke He has residual R sided weakness and ambulates with a walker.    Immunization He was given his flu shot today.   Dispo:  Return in about 6 months (around 04/30/2018) for Routine Follow Up, w/ Dr. Acie Fredrickson, or Richardson Dopp, PA-C.   Medication Adjustments/Labs and Tests Ordered: Current medicines are reviewed at length with the patient today.  Concerns regarding medicines are outlined above.  Tests Ordered: Orders Placed This Encounter  Procedures  . Flu Vaccine QUAD 36+ mos IM   Medication Changes: No orders of the defined types were placed in this encounter.   Signed, Richardson Dopp, PA-C  10/29/2017 1:20 PM    [facility] [place], [place],   [address] Phone: [phone]; Fax: [phone]

## 2017-10-29 ENCOUNTER — Other Ambulatory Visit: Payer: Medicare Other | Admitting: *Deleted

## 2017-10-29 ENCOUNTER — Encounter: Payer: Self-pay | Admitting: Physician Assistant

## 2017-10-29 ENCOUNTER — Ambulatory Visit (INDEPENDENT_AMBULATORY_CARE_PROVIDER_SITE_OTHER): Payer: Medicare Other | Admitting: Physician Assistant

## 2017-10-29 VITALS — BP 118/81 | HR 75 | Ht 71.0 in | Wt 222.0 lb

## 2017-10-29 DIAGNOSIS — I69359 Hemiplegia and hemiparesis following cerebral infarction affecting unspecified side: Secondary | ICD-10-CM

## 2017-10-29 DIAGNOSIS — I5032 Chronic diastolic (congestive) heart failure: Secondary | ICD-10-CM

## 2017-10-29 DIAGNOSIS — I482 Chronic atrial fibrillation, unspecified: Secondary | ICD-10-CM

## 2017-10-29 DIAGNOSIS — I10 Essential (primary) hypertension: Secondary | ICD-10-CM | POA: Diagnosis not present

## 2017-10-29 DIAGNOSIS — I4821 Permanent atrial fibrillation: Secondary | ICD-10-CM | POA: Diagnosis not present

## 2017-10-29 DIAGNOSIS — Z23 Encounter for immunization: Secondary | ICD-10-CM

## 2017-10-29 NOTE — Patient Instructions (Signed)
Medication Instructions:  1. Your physician recommends that you continue on your current medications as directed. Please refer to the Current Medication list given to you today.  If you need a refill on your cardiac medications before your next appointment, please call your pharmacy.   Lab work: NONE ORDERED TODAY If you have labs (blood work) drawn today and your tests are completely normal, you will receive your results only by: Marland Kitchen MyChart Message (if you have MyChart) OR . A paper copy in the mail If you have any lab test that is abnormal or we need to change your treatment, we will call you to review the results.  Testing/Procedures: NONE ORDERED TODAY  Follow-Up: At [facility], you and your health needs are our priority.  As part of our continuing mission to provide you with exceptional heart care, we have created designated Provider Care Teams.  These Care Teams include your primary Cardiologist (physician) and Advanced Practice Providers (APPs -  Physician Assistants and Nurse Practitioners) who all work together to provide you with the care you need, when you need it. You will need a follow up appointment in:  6 months.  Please call our office 2 months in advance to schedule this appointment.  You may see Mertie Moores, MD or one of the following Advanced Practice Providers on your designated Care Team: Richardson Dopp, PA-C [place], [place] . Daune Perch, NP  Any Other Special Instructions Will Be Listed Below (If Applicable).

## 2017-10-30 LAB — BASIC METABOLIC PANEL
BUN / CREAT RATIO: 14 (ref 10–24)
BUN: 22 mg/dL (ref 8–27)
CALCIUM: 10 mg/dL (ref 8.6–10.2)
CO2: 23 mmol/L (ref 20–29)
Chloride: 98 mmol/L (ref 96–106)
Creatinine, Ser: 1.55 mg/dL — ABNORMAL HIGH (ref 0.76–1.27)
GFR, EST AFRICAN AMERICAN: 54 mL/min/{1.73_m2} — AB (ref >59)
GFR, EST NON AFRICAN AMERICAN: 47 mL/min/{1.73_m2} — AB (ref >59)
Glucose: 111 mg/dL — ABNORMAL HIGH (ref 65–99)
POTASSIUM: 4.9 mmol/L (ref 3.5–5.2)
Sodium: 137 mmol/L (ref 134–144)

## 2017-11-19 ENCOUNTER — Other Ambulatory Visit: Payer: Self-pay | Admitting: Internal Medicine

## 2017-11-19 DIAGNOSIS — I480 Paroxysmal atrial fibrillation: Secondary | ICD-10-CM

## 2017-11-24 ENCOUNTER — Telehealth: Payer: Self-pay | Admitting: Internal Medicine

## 2017-11-24 DIAGNOSIS — I480 Paroxysmal atrial fibrillation: Secondary | ICD-10-CM

## 2017-11-24 MED ORDER — DILTIAZEM HCL ER COATED BEADS 180 MG PO CP24: 360.0000 mg | ORAL_CAPSULE | Freq: Every day | ORAL | 0 refills | Status: DC

## 2017-11-24 NOTE — Telephone Encounter (Signed)
1) Medication(s) Requested (by name): -diltiazem (CARDIZEM CD) 180 MG 24 hr capsule   2) Pharmacy of Choice: -[place] [place] 3) Special Requests:   Approved medications will be sent to the pharmacy, we will reach out if there is an issue.  Requests made after 3pm may not be addressed until the following business day!  If a patient is unsure of the name of the medication(s) please note and ask patient to call back when they are able to provide all info, do not send to responsible party until all information is available!

## 2017-12-06 ENCOUNTER — Ambulatory Visit: Payer: Medicare Other | Attending: Internal Medicine | Admitting: Internal Medicine

## 2017-12-06 ENCOUNTER — Other Ambulatory Visit: Payer: Self-pay

## 2017-12-06 ENCOUNTER — Encounter: Payer: Self-pay | Admitting: Internal Medicine

## 2017-12-06 VITALS — BP 123/83 | HR 78 | Temp 97.7°F | Resp 16 | Wt 225.4 lb

## 2017-12-06 DIAGNOSIS — E785 Hyperlipidemia, unspecified: Secondary | ICD-10-CM | POA: Diagnosis not present

## 2017-12-06 DIAGNOSIS — I2510 Atherosclerotic heart disease of native coronary artery without angina pectoris: Secondary | ICD-10-CM | POA: Diagnosis not present

## 2017-12-06 DIAGNOSIS — E118 Type 2 diabetes mellitus with unspecified complications: Secondary | ICD-10-CM

## 2017-12-06 DIAGNOSIS — I480 Paroxysmal atrial fibrillation: Secondary | ICD-10-CM | POA: Diagnosis not present

## 2017-12-06 DIAGNOSIS — Z87891 Personal history of nicotine dependence: Secondary | ICD-10-CM | POA: Diagnosis not present

## 2017-12-06 DIAGNOSIS — M109 Gout, unspecified: Secondary | ICD-10-CM | POA: Diagnosis not present

## 2017-12-06 DIAGNOSIS — E7800 Pure hypercholesterolemia, unspecified: Secondary | ICD-10-CM | POA: Diagnosis not present

## 2017-12-06 DIAGNOSIS — E1122 Type 2 diabetes mellitus with diabetic chronic kidney disease: Secondary | ICD-10-CM | POA: Insufficient documentation

## 2017-12-06 DIAGNOSIS — Z7901 Long term (current) use of anticoagulants: Secondary | ICD-10-CM | POA: Diagnosis not present

## 2017-12-06 DIAGNOSIS — N183 Chronic kidney disease, stage 3 unspecified: Secondary | ICD-10-CM

## 2017-12-06 DIAGNOSIS — Z794 Long term (current) use of insulin: Secondary | ICD-10-CM | POA: Insufficient documentation

## 2017-12-06 DIAGNOSIS — K2100 Gastro-esophageal reflux disease with esophagitis, without bleeding: Secondary | ICD-10-CM

## 2017-12-06 DIAGNOSIS — Z8673 Personal history of transient ischemic attack (TIA), and cerebral infarction without residual deficits: Secondary | ICD-10-CM | POA: Diagnosis not present

## 2017-12-06 DIAGNOSIS — Z8249 Family history of ischemic heart disease and other diseases of the circulatory system: Secondary | ICD-10-CM | POA: Diagnosis not present

## 2017-12-06 DIAGNOSIS — Z79899 Other long term (current) drug therapy: Secondary | ICD-10-CM | POA: Insufficient documentation

## 2017-12-06 DIAGNOSIS — E1159 Type 2 diabetes mellitus with other circulatory complications: Secondary | ICD-10-CM | POA: Diagnosis not present

## 2017-12-06 DIAGNOSIS — I4821 Permanent atrial fibrillation: Secondary | ICD-10-CM | POA: Insufficient documentation

## 2017-12-06 DIAGNOSIS — I5032 Chronic diastolic (congestive) heart failure: Secondary | ICD-10-CM | POA: Diagnosis not present

## 2017-12-06 DIAGNOSIS — I482 Chronic atrial fibrillation, unspecified: Secondary | ICD-10-CM

## 2017-12-06 DIAGNOSIS — I130 Hypertensive heart and chronic kidney disease with heart failure and stage 1 through stage 4 chronic kidney disease, or unspecified chronic kidney disease: Secondary | ICD-10-CM | POA: Insufficient documentation

## 2017-12-06 DIAGNOSIS — I10 Essential (primary) hypertension: Secondary | ICD-10-CM | POA: Diagnosis not present

## 2017-12-06 LAB — POCT GLYCOSYLATED HEMOGLOBIN (HGB A1C): HBA1C, POC (PREDIABETIC RANGE): 6.6 % — AB (ref 5.7–6.4)

## 2017-12-06 LAB — GLUCOSE, POCT (MANUAL RESULT ENTRY): POC GLUCOSE: 118 mg/dL — AB (ref 70–99)

## 2017-12-06 MED ORDER — SPIRONOLACTONE 25 MG PO TABS: 25.0000 mg | ORAL_TABLET | Freq: Every day | ORAL | 3 refills | Status: DC

## 2017-12-06 MED ORDER — ISOSORBIDE MONONITRATE ER 60 MG PO TB24: 60.0000 mg | ORAL_TABLET | Freq: Every day | ORAL | 3 refills | Status: DC

## 2017-12-06 MED ORDER — HYDRALAZINE HCL 50 MG PO TABS: 50.0000 mg | ORAL_TABLET | Freq: Two times a day (BID) | ORAL | 3 refills | Status: DC

## 2017-12-06 MED ORDER — FUROSEMIDE 20 MG PO TABS: 40.0000 mg | ORAL_TABLET | Freq: Every day | ORAL | 1 refills | Status: DC | PRN

## 2017-12-06 MED ORDER — ONETOUCH DELICA LANCETS 33G MISC: 12 refills | Status: AC

## 2017-12-06 MED ORDER — DILTIAZEM HCL ER COATED BEADS 180 MG PO CP24: 360.0000 mg | ORAL_CAPSULE | Freq: Every day | ORAL | 5 refills | Status: DC

## 2017-12-06 MED ORDER — METFORMIN HCL 850 MG PO TABS: ORAL_TABLET | ORAL | 3 refills | Status: DC

## 2017-12-06 MED ORDER — APIXABAN 5 MG PO TABS: 5.0000 mg | ORAL_TABLET | Freq: Two times a day (BID) | ORAL | 1 refills | Status: DC

## 2017-12-06 MED ORDER — ONETOUCH VERIO W/DEVICE KIT: PACK | 0 refills | Status: DC

## 2017-12-06 MED ORDER — PANTOPRAZOLE SODIUM 40 MG PO TBEC: DELAYED_RELEASE_TABLET | ORAL | 3 refills | Status: DC

## 2017-12-06 MED ORDER — GLUCOSE BLOOD VI STRP: ORAL_STRIP | 12 refills | Status: DC

## 2017-12-06 MED ORDER — PRAVASTATIN SODIUM 20 MG PO TABS: 20.0000 mg | ORAL_TABLET | Freq: Every morning | ORAL | 3 refills | Status: DC

## 2017-12-06 MED ORDER — CARVEDILOL 25 MG PO TABS: 25.0000 mg | ORAL_TABLET | Freq: Two times a day (BID) | ORAL | 3 refills | Status: DC

## 2017-12-06 NOTE — Patient Instructions (Signed)
Please remember to call and schedule your eye appointment with Dr. Schuyler Amor.

## 2017-12-06 NOTE — Progress Notes (Signed)
Patient ID: [name], male    DOB: 01/27/1955  MRN: [id number]  CC: Diabetes and Hypertension   Subjective: [name] is a 62 y.o. male who presents for chronic disease management. His concerns today include:  Patient with history of A. fib, CAD, diastolic CHF (cardiologist Dr. Cathie Olden), DM, HTN, HL, ICH with residual right-sided weakness, CKD stage III  DM:  Checks BS 3-4 x a mth. Needs new meter and stripes Eating: Doing well with eating habits.  He avoids foods that he knows will elevate his blood sugars. Activity level:  Does some stretching exercises in the mornings.  Had a stationary bike that no longer works.  Looking to try get another one He denies any blurred vision.  He is overdue for eye exam.  He states he will call and schedule one himself.  CHF/CAD/HTN/atrial fibrillation: He denies any chest pains or shortness of breath at rest on exertion.  He denies any lower extremity edema.  He reports compliance with his medications.  He denies any bruising or bleeding being on the anticoagulant Eliquis. -He tries to limit salt in the foods. Patient Active Problem List   Diagnosis Date Noted  . Chronic diastolic CHF (congestive heart failure) ([place]) 10/28/2017  . History of cerebrovascular accident (CVA) with residual deficit 05/04/2017  . CKD (chronic kidney disease) stage 3, GFR 30-59 ml/min (HCC) 05/04/2017  . GERD (gastroesophageal reflux disease) 02/08/2015  . HTN (hypertension) 07/01/2012  . ICH (intracerebral hemorrhage) ([place]) 12/22/2010  . Hyperlipemia 11/21/2010  . Gout 11/20/2010  . CAD (coronary artery disease) 11/20/2010  . Stroke ([place]) 11/19/2010  . Diabetes type 2, controlled ([place]) 11/19/2010  . Permanent atrial fibrillation 11/19/2010     Current Outpatient Medications on File Prior to Visit  Medication Sig Dispense Refill  . Alcohol Swabs (ALCOHOL PREP) 70 % PADS     . [DISCONTINUED] insulin glargine (LANTUS) 100 UNIT/ML injection Inject 0.15 mLs (15  Units total) into the skin at bedtime. 12 mL 1  . [DISCONTINUED] potassium chloride (KLOR-CON M15) 15 MEQ tablet Take 2 tablets (30 mEq total) by mouth 2 (two) times daily. 120 tablet 1  . [DISCONTINUED] rosuvastatin (CRESTOR) 5 MG tablet Place 1 tablet (5 mg total) into feeding tube daily at 6 PM. 30 tablet 2   No current facility-administered medications on file prior to visit.     No Known Allergies  Social History   Socioeconomic History  . Marital status: Single    Spouse name: Not on file  . Number of children: 0  . Years of education: Not on file  . Highest education level: Not on file  Occupational History  . Occupation: Disability  Social Needs  . Financial resource strain: Not on file  . Food insecurity:    Worry: Not on file    Inability: Not on file  . Transportation needs:    Medical: Not on file    Non-medical: Not on file  Tobacco Use  . Smoking status: Former Smoker    Types: Cigarettes    Last attempt to quit: 12/19/2005    Years since quitting: 11.9  . Smokeless tobacco: Never Used  Substance and Sexual Activity  . Alcohol use: No    Alcohol/week: 11.0 standard drinks    Types: 6 Glasses of wine, 5 Shots of liquor per week    Comment: quit in 2012  . Drug use: No  . Sexual activity: Not on file  Lifestyle  . Physical activity:  Days per week: Not on file    Minutes per session: Not on file  . Stress: Not on file  Relationships  . Social connections:    Talks on phone: Not on file    Gets together: Not on file    Attends religious service: Not on file    Active member of club or organization: Not on file    Attends meetings of clubs or organizations: Not on file    Relationship status: Not on file  . Intimate partner violence:    Fear of current or ex partner: Not on file    Emotionally abused: Not on file    Physically abused: Not on file    Forced sexual activity: Not on file  Other Topics Concern  . Not on file  Social History Narrative   . Not on file    Family History  Problem Relation Age of Onset  . Diabetes Mother   . Diabetes Brother   . Colon cancer Neg Hx   . Colon polyps Neg Hx   . Kidney disease Neg Hx   . Esophageal cancer Neg Hx   . Heart disease Neg Hx   . Gallbladder disease Neg Hx     Past Surgical History:  Procedure Laterality Date  . PEG PLACEMENT  12/03/2010   Procedure: PERCUTANEOUS ENDOSCOPIC GASTROSTOMY (PEG) PLACEMENT;  Surgeon: Lafayette Dragon, MD;  Location: [facility] ENDOSCOPY;  Service: Endoscopy;  Laterality: N/A;  . TRACHEOSTOMY TUBE PLACEMENT  11/28/2010   Procedure: TRACHEOSTOMY;  Surgeon: Beckie Salts, MD;  Location: [facility] OR;  Service: ENT;  Laterality: N/A;    ROS: Review of Systems  Eyes:       Last eye exam was over 1 yr ago.   Respiratory: Negative for chest tightness and shortness of breath.   Cardiovascular: Negative for chest pain, palpitations and leg swelling.  Gastrointestinal: Negative for abdominal pain.       Moving bowels okay  Genitourinary: Negative for difficulty urinating and hematuria.  Musculoskeletal:       He ambulates with a rolling walker.  He denies any falls.    Psychiatric/Behavioral: Negative for dysphoric mood. The patient is not nervous/anxious.     PHYSICAL EXAM: BP 123/83   Pulse 78   Temp 97.7 F (36.5 C) (Oral)   Resp 16   Wt 225 lb 6.4 oz (102.2 kg)   SpO2 98%   BMI 31.44 kg/m   Wt Readings from Last 3 Encounters:  12/06/17 225 lb 6.4 oz (102.2 kg)  10/29/17 222 lb (100.7 kg)  07/30/17 223 lb 6.4 oz (101.3 kg)    Physical Exam  General appearance - alert, well appearing, older African-American male and in no distress Mental status - normal mood, behavior, speech, dress, motor activity, and thought processes Mouth - mucous membranes moist, pharynx normal without lesions Neck - supple, no significant adenopathy Chest - clear to auscultation, no wheezes, rales or rhonchi, symmetric air entry Heart -irregularly irregular but rate  controlled. Musculoskeletal -ambulates with a rolling walker. Extremities -Trace to 1 + LE edema Diabetic Foot Exam - Simple   Simple Foot Form Visual Inspection No deformities, no ulcerations, no other skin breakdown bilaterally:  Yes Sensation Testing Intact to touch and monofilament testing bilaterally:  Yes Pulse Check Posterior Tibialis and Dorsalis pulse intact bilaterally:  Yes Comments     Results for orders placed or performed in visit on 12/06/17  POCT glucose (manual entry)  Result Value Ref Range  POC Glucose 118 (A) 70 - 99 mg/dl  POCT glycosylated hemoglobin (Hb A1C)  Result Value Ref Range   Hemoglobin A1C     HbA1c POC (<> result, manual entry)     HbA1c, POC (prediabetic range) 6.6 (A) 5.7 - 6.4 %   HbA1c, POC (controlled diabetic range)     Lab Results  Component Value Date   WBC 6.1 07/30/2017   HGB 13.2 07/30/2017   HCT 40.7 07/30/2017   MCV 93 07/30/2017   PLT 283 07/30/2017     Chemistry      Component Value Date/Time   NA 137 10/29/2017 1257   K 4.9 10/29/2017 1257   CL 98 10/29/2017 1257   CO2 23 10/29/2017 1257   BUN 22 10/29/2017 1257   CREATININE 1.55 (H) 10/29/2017 1257   CREATININE 1.24 02/07/2016 1152      Component Value Date/Time   CALCIUM 10.0 10/29/2017 1257   ALKPHOS 54 05/04/2017 1433   AST 12 05/04/2017 1433   ALT 6 05/04/2017 1433   BILITOT 0.3 05/04/2017 1433      ASSESSMENT AND PLAN:  1. Controlled type 2 diabetes mellitus with complication, without long-term current use of insulin (HCC) Continue metformin and healthy eating habits. Patient to call and schedule his eye appointment - POCT glucose (manual entry) - POCT glycosylated hemoglobin (Hb A1C) - metFORMIN (GLUCOPHAGE) 850 MG tablet; TAKE 1 TABLET BY MOUTH TWO  TIMES DAILY WITH MEALS  Dispense: 180 tablet; Refill: 3  2. Paroxysmal atrial fibrillation (HCC) Rate controlled. - diltiazem (CARDIZEM CD) 180 MG 24 hr capsule; Take 2 capsules (360 mg total) by  mouth daily. At noon  Dispense: 60 capsule; Refill: 0  3. Coronary artery disease involving native coronary artery of native heart without angina pectoris - carvedilol (COREG) 25 MG tablet; Take 1 tablet (25 mg total) by mouth 2 (two) times daily with a meal.  Dispense: 180 tablet; Refill: 3 - isosorbide mononitrate (IMDUR) 60 MG 24 hr tablet; Take 1 tablet (60 mg total) by mouth daily.  Dispense: 90 tablet; Refill: 3 - spironolactone (ALDACTONE) 25 MG tablet; Take 1 tablet (25 mg total) by mouth daily.  Dispense: 90 tablet; Refill: 3  4. Essential hypertension Close to goal.  Continue current medications - hydrALAZINE (APRESOLINE) 50 MG tablet; Take 1 tablet (50 mg total) by mouth 2 (two) times daily.  Dispense: 180 tablet; Refill: 3  5. Gastroesophageal reflux disease with esophagitis Patient requested refill on Protonix - pantoprazole (PROTONIX) 40 MG tablet; TAKE 1 TABLET BY MOUTH  DAILY AT 12 NOON.  Dispense: 90 tablet; Refill: 3  6. Pure hypercholesterolemia - pravastatin (PRAVACHOL) 20 MG tablet; Take 1 tablet (20 mg total) by mouth every morning.  Dispense: 90 tablet; Refill: 3  7. Chronic atrial fibrillation - apixaban (ELIQUIS) 5 MG TABS tablet; Take 1 tablet (5 mg total) by mouth 2 (two) times daily.  Dispense: 180 tablet; Refill: 1   Patient was given the opportunity to ask questions.  Patient verbalized understanding of the plan and was able to repeat key elements of the plan.   Orders Placed This Encounter  Procedures  . POCT glucose (manual entry)  . POCT glycosylated hemoglobin (Hb A1C)     Requested Prescriptions   Signed Prescriptions Disp Refills  . diltiazem (CARDIZEM CD) 180 MG 24 hr capsule 180 capsule 5    Sig: Take 2 capsules (360 mg total) by mouth daily. At noon  . carvedilol (COREG) 25 MG tablet 180 tablet 3  Sig: Take 1 tablet (25 mg total) by mouth 2 (two) times daily with a meal.  . hydrALAZINE (APRESOLINE) 50 MG tablet 180 tablet 3    Sig: Take  1 tablet (50 mg total) by mouth 2 (two) times daily.  . isosorbide mononitrate (IMDUR) 60 MG 24 hr tablet 90 tablet 3    Sig: Take 1 tablet (60 mg total) by mouth daily.  . metFORMIN (GLUCOPHAGE) 850 MG tablet 180 tablet 3    Sig: TAKE 1 TABLET BY MOUTH TWO  TIMES DAILY WITH MEALS  . pantoprazole (PROTONIX) 40 MG tablet 90 tablet 3    Sig: TAKE 1 TABLET BY MOUTH  DAILY AT 12 NOON.  Marland Kitchen pravastatin (PRAVACHOL) 20 MG tablet 90 tablet 3    Sig: Take 1 tablet (20 mg total) by mouth every morning.  Marland Kitchen spironolactone (ALDACTONE) 25 MG tablet 90 tablet 3    Sig: Take 1 tablet (25 mg total) by mouth daily.  Marland Kitchen apixaban (ELIQUIS) 5 MG TABS tablet 180 tablet 1    Sig: Take 1 tablet (5 mg total) by mouth 2 (two) times daily.  . furosemide (LASIX) 20 MG tablet 90 tablet 1    Sig: Take 2 tablets (40 mg total) by mouth daily as needed for edema.    Return in about 4 months (around 04/06/2018).  Karle Plumber, MD, FACP

## 2017-12-31 ENCOUNTER — Telehealth: Payer: Self-pay | Admitting: Internal Medicine

## 2017-12-31 NOTE — Telephone Encounter (Signed)
1) Medication(s) Requested (by name): diltiazem 2) Pharmacy of Choice:  [facility] [place] [address]  Patient called to say he has not been receiving his 180 capsules -  3 mo. Supply and has only been given 60 capsules and says he is out and would like to know if anything can be done so he can get more pills.  Please follow up

## 2018-01-03 NOTE — Telephone Encounter (Signed)
RX was sent to mail order on 12/06/17 for #180 capsules, if they are not sending the prescription to him with a 90 day supply he should contact the mail order service directly to find out why.

## 2018-02-06 ENCOUNTER — Other Ambulatory Visit: Payer: Self-pay | Admitting: Internal Medicine

## 2018-05-04 ENCOUNTER — Other Ambulatory Visit: Payer: Self-pay | Admitting: Internal Medicine

## 2018-05-04 DIAGNOSIS — I482 Chronic atrial fibrillation, unspecified: Secondary | ICD-10-CM

## 2018-07-27 ENCOUNTER — Other Ambulatory Visit: Payer: Self-pay | Admitting: Internal Medicine

## 2018-08-05 DIAGNOSIS — I69351 Hemiplegia and hemiparesis following cerebral infarction affecting right dominant side: Secondary | ICD-10-CM | POA: Diagnosis not present

## 2018-08-05 DIAGNOSIS — N183 Chronic kidney disease, stage 3 (moderate): Secondary | ICD-10-CM | POA: Diagnosis not present

## 2018-08-05 DIAGNOSIS — K219 Gastro-esophageal reflux disease without esophagitis: Secondary | ICD-10-CM | POA: Diagnosis not present

## 2018-08-05 DIAGNOSIS — E785 Hyperlipidemia, unspecified: Secondary | ICD-10-CM | POA: Diagnosis not present

## 2018-08-08 DIAGNOSIS — I130 Hypertensive heart and chronic kidney disease with heart failure and stage 1 through stage 4 chronic kidney disease, or unspecified chronic kidney disease: Secondary | ICD-10-CM | POA: Diagnosis not present

## 2018-08-08 DIAGNOSIS — N183 Chronic kidney disease, stage 3 (moderate): Secondary | ICD-10-CM | POA: Diagnosis not present

## 2018-08-08 DIAGNOSIS — I5042 Chronic combined systolic (congestive) and diastolic (congestive) heart failure: Secondary | ICD-10-CM | POA: Diagnosis not present

## 2018-08-08 DIAGNOSIS — Z008 Encounter for other general examination: Secondary | ICD-10-CM | POA: Diagnosis not present

## 2018-09-28 ENCOUNTER — Other Ambulatory Visit: Payer: Self-pay | Admitting: Internal Medicine

## 2018-09-28 DIAGNOSIS — K2100 Gastro-esophageal reflux disease with esophagitis, without bleeding: Secondary | ICD-10-CM

## 2018-09-28 DIAGNOSIS — I482 Chronic atrial fibrillation, unspecified: Secondary | ICD-10-CM

## 2018-11-03 ENCOUNTER — Other Ambulatory Visit: Payer: Self-pay | Admitting: Internal Medicine

## 2018-11-03 DIAGNOSIS — I482 Chronic atrial fibrillation, unspecified: Secondary | ICD-10-CM

## 2018-11-14 ENCOUNTER — Ambulatory Visit: Payer: Medicare Other | Admitting: Internal Medicine

## 2018-11-30 ENCOUNTER — Telehealth: Payer: Self-pay | Admitting: Cardiovascular Disease

## 2018-11-30 NOTE — Telephone Encounter (Signed)
S/w pt today and he is aware of needing appt for surgery clearance. Pt has been scheduled to see Dr. Acie Fredrickson 12/12/18 @ 11:20 am. Pt has been given address [address] Pt aware he will need to wear his mask for the whole appt. Pt thanked me for the call. I will route to Dr. Acie Fredrickson so that he will have the surgery clearance information for appt. I will also send note to Dr. Ellison Hughs with [facility] as to pt is needing appt before he can be cleared. I will remove from the pre op call back pool.

## 2018-11-30 NOTE — Telephone Encounter (Signed)
New message      [facility] Pre-operative Risk Assessment    Request for surgical clearance:  1. What type of surgery is being performed?Prostate Biopsy  2. When is this surgery scheduled?TBD  3. What type of clearance is required (medical clearance vs. Pharmacy clearance to hold med vs. Both)? Pharmacy  4. Are there any medications that need to be held prior to surgery and how long?Wants to know how long to hold Eliquis  5. Practice name and name of physician performing surgery?[facility], Dr. Ellison Hughs   What is your office phone number [phone]   7.   What is your office fax number [phone]  8.   Anesthesia type (None, local, MAC, general) ?none  Maryjane Hurter 11/30/2018, 8:30 AM  _________________________________________________________________   (provider comments below)

## 2018-11-30 NOTE — Telephone Encounter (Signed)
Pt takes Eliquis for afib with CHADS2VASc score of 6 (CHF, HTN, CAD, DM, stroke). SCr 1.55 on 10/29/17, has not had BMET more recently than this. Pt overdue for annual follow up, recommend rechecking BMET and CBC at next appt (this needs to be scheduled).  CrCl using SCr from last year is 98mL/min. Due to hx of stroke and elevated cardiac risk, recommend holding Eliquis for 1 day prior. If longer hold is needed, will need MD input.

## 2018-11-30 NOTE — Telephone Encounter (Signed)
   Primary Cardiologist:Philip Nahser, MD  Chart reviewed as part of pre-operative protocol coverage. Patient overdue for annual visit. Since it does not look like procedure has been scheduled yet, would recommend scheduling appointment prior to procedure.  Pre-op covering staff: - Please schedule appointment and call patient to inform them. - Please contact requesting surgeon's office via preferred method (i.e, phone, fax) to inform them of need for appointment prior to surgery. Please also notify them of Pharmacy recommendation for only holding Eliquis 1 day prior to procedure. If they would like it to be held for longer, we will need to get Dr. Elmarie Shiley input.   Thank you!  Darreld Mclean, PA-C  11/30/2018, 10:21 AM

## 2018-12-12 ENCOUNTER — Ambulatory Visit: Payer: Medicare Other | Admitting: Cardiovascular Disease

## 2018-12-12 ENCOUNTER — Other Ambulatory Visit: Payer: Self-pay

## 2018-12-12 ENCOUNTER — Encounter: Payer: Self-pay | Admitting: Cardiovascular Disease

## 2018-12-12 VITALS — BP 122/78 | HR 72 | Ht 71.0 in | Wt 243.8 lb

## 2018-12-12 DIAGNOSIS — Z5181 Encounter for therapeutic drug level monitoring: Secondary | ICD-10-CM

## 2018-12-12 DIAGNOSIS — I482 Chronic atrial fibrillation, unspecified: Secondary | ICD-10-CM | POA: Diagnosis not present

## 2018-12-12 DIAGNOSIS — I10 Essential (primary) hypertension: Secondary | ICD-10-CM

## 2018-12-12 DIAGNOSIS — I5032 Chronic diastolic (congestive) heart failure: Secondary | ICD-10-CM | POA: Diagnosis not present

## 2018-12-12 NOTE — Patient Instructions (Signed)
Medication Instructions:  Your physician recommends that you continue on your current medications as directed. Please refer to the Current Medication list given to you today.  *If you need a refill on your cardiac medications before your next appointment, please call your pharmacy*  Lab Work: TODAY - basic metabolic panel If you have labs (blood work) drawn today and your tests are completely normal, you will receive your results only by: Marland Kitchen MyChart Message (if you have MyChart) OR . A paper copy in the mail If you have any lab test that is abnormal or we need to change your treatment, we will call you to review the results.   Testing/Procedures: None Ordered   Follow-Up: At [facility], you and your health needs are our priority.  As part of our continuing mission to provide you with exceptional heart care, we have created designated Provider Care Teams.  These Care Teams include your primary Cardiologist (physician) and Advanced Practice Providers (APPs -  Physician Assistants and Nurse Practitioners) who all work together to provide you with the care you need, when you need it.  Your next appointment:   1 year(s)  The format for your next appointment:   In Person  Provider:   You may see Mertie Moores, MD or one of the following Advanced Practice Providers on your designated Care Team:    Richardson Dopp, PA-C  [place], [place]  Daune Perch, [place]

## 2018-12-12 NOTE — Progress Notes (Signed)
Cardiology Office Note   Date:  12/12/2018   ID:  [name], [name] 15-Jun-1955, MRN [id number]  PCP:  System, Pcp Not In  Cardiologist:   Mertie Moores, MD  Former patient of Dr. Verl Blalock   Chief Complaint  Patient presents with  . Atrial Fibrillation  . Congestive Heart Failure   Problem List 1. Atrial fib  2. Chronic diastolic CHF 3. Essential HTN 4. Intracranial hemorrhage ( on coumadin )  5. DM 6. COPD      Previous notes:  [name] is a 63 y.o. male who presents for further evaluation of paroxysmal atrial fibrillation.  He was last seen by Dr. Verl Blalock in Dec. 2014.  No CP recently, no dyspnea  Has better fine motor control in the left side of his body   Able to walk with a walker,  Was seen today in a wheelchair.   March 27, 2016:  Now using a walker ( was in a wheelchair at his last visit )  No CP or dyspnea  BP remains low   July 30, 2017:    Follow up for atrial fib .   Has a history of chronic diastolic congestive heart failure.  Also has a history of hypertension, hyperlipidemia.  Is on PRN lasix - is not on potassium  . Breathing is ok .   Legs are very swollen    Nov. 30,  2020   Here for preoperative evaluation prior to prostate biopsy. Is here in a wheelchair.   Uses a walker at home Had a stroke years ago  Takes his lasix just once a month - is concerned that the Lasix may be contributing to his slight increase in creatinine. He has 1-2+ bilateral ankle edema.  He gets around using a walker so some of this edema may be due to the fact that he is relatively inactive from his stroke.   Past Medical History:  Diagnosis Date  . A-fib ([place]) 11/19/2010  . Acute exacerbation of congestive heart failure ([place]) 11/19/2010  . Arthritis   . CAD (coronary artery disease) 11/20/2010  . CHF (congestive heart failure) ([place])   . CKD (chronic kidney disease) stage 3, GFR 30-59 ml/min 05/04/2017  . Coronary artery disease   . Diabetes mellitus   .  Diabetes mellitus 11/19/2010  . Diabetes type 2, controlled ([place]) 11/19/2010  . GERD (gastroesophageal reflux disease) 02/08/2015  . Gout   . Gout 11/20/2010  . History of cerebrovascular accident (CVA) with residual deficit 05/04/2017  . HTN (hypertension) 07/01/2012  . Hyperlipemia 11/21/2010  . Hypertension   . Hypertensive emergency 11/19/2010  . ICH (intracerebral hemorrhage) ([place]) 12/22/2010  . Physical deconditioning 12/22/2010  . Pulmonary edema 11/19/2010  . Respiratory failure ([place]) 11/19/2010  . Shortness of breath   . Stroke ([place]) 11/19/2010  . Thyroiditis 11/20/2010    Past Surgical History:  Procedure Laterality Date  . PEG PLACEMENT  12/03/2010   Procedure: PERCUTANEOUS ENDOSCOPIC GASTROSTOMY (PEG) PLACEMENT;  Surgeon: Lafayette Dragon, MD;  Location: [facility];  Service: Endoscopy;  Laterality: N/A;  . TRACHEOSTOMY TUBE PLACEMENT  11/28/2010   Procedure: TRACHEOSTOMY;  Surgeon: Beckie Salts, MD;  Location: [facility] OR;  Service: ENT;  Laterality: N/A;     Current Outpatient Medications  Medication Sig Dispense Refill  . Alcohol Swabs (ALCOHOL PREP) 70 % PADS     . Blood Glucose Monitoring Suppl (ONETOUCH VERIO) w/Device KIT Use as directed to test blood sugar once daily 1 kit  0  . carvedilol (COREG) 25 MG tablet Take 1 tablet (25 mg total) by mouth 2 (two) times daily with a meal. 180 tablet 3  . diltiazem (CARDIZEM CD) 180 MG 24 hr capsule Take 2 capsules (360 mg total) by mouth daily. At noon 180 capsule 5  . ELIQUIS 5 MG TABS tablet TAKE 1 TABLET BY MOUTH  TWICE DAILY 60 tablet 0  . furosemide (LASIX) 40 MG tablet Take 40 mg by mouth as needed.    Marland Kitchen glucose blood (ONETOUCH VERIO) test strip Use as directed to test blood sugar once daily 100 each 12  . hydrALAZINE (APRESOLINE) 50 MG tablet Take 1 tablet (50 mg total) by mouth 2 (two) times daily. 180 tablet 3  . isosorbide mononitrate (IMDUR) 60 MG 24 hr tablet Take 1 tablet (60 mg total) by mouth daily. 90 tablet 3  .  metFORMIN (GLUCOPHAGE) 850 MG tablet TAKE 1 TABLET BY MOUTH TWO  TIMES DAILY WITH MEALS 180 tablet 3  . ONETOUCH DELICA LANCETS 75T MISC Use as directed to test blood sugar once daily 100 each 12  . pantoprazole (PROTONIX) 40 MG tablet TAKE 1 TABLET BY MOUTH  DAILY AT 12 NOON. 90 tablet 3  . pravastatin (PRAVACHOL) 40 MG tablet Take 40 mg by mouth daily.    Marland Kitchen spironolactone (ALDACTONE) 25 MG tablet Take 1 tablet (25 mg total) by mouth daily. 90 tablet 3   No current facility-administered medications for this visit.     Allergies:   Patient has no known allergies.    Social History:  The patient  reports that he quit smoking about 12 years ago. His smoking use included cigarettes. He has never used smokeless tobacco. He reports that he does not drink alcohol or use drugs.   Family History:  The patient's family history includes Diabetes in his brother and mother.    ROS: Noted in current history, otherwise review of systems is negative.   Physical Exam: Blood pressure 122/78, pulse 72, height 5' 11"  (1.803 m), weight 243 lb 12.8 oz (110.6 kg), SpO2 97 %.  GEN:   Middle age male, NAD  HEENT: Normal NECK: No JVD; No carotid bruits LYMPHATICS: No lymphadenopathy CARDIAC:  Irreg. Irreg.  No significant murmurs RESPIRATORY:  Clear to auscultation without rales, wheezing or rhonchi  ABDOMEN: Soft, non-tender, non-distended MUSCULOSKELETAL:  1-2 + edema  SKIN: Warm and dry NEUROLOGIC:  Alert and oriented x 3   EKG:  December 12, 2018: Atrial fibrillation with a ventricular rate of 70.  Incomplete right bundle branch block.  Previous inferior wall infarction and possible ant  wall infarction.  No changes on EKGs for the past several years.  Recent Labs: No results found for requested labs within last 8760 hours.    Lipid Panel    Component Value Date/Time   CHOL 139 05/04/2017 1433   TRIG 112 05/04/2017 1433   HDL 50 05/04/2017 1433   CHOLHDL 2.8 05/04/2017 1433   CHOLHDL 2.4  02/07/2016 1152   VLDL 25 02/07/2016 1152   LDLCALC 67 05/04/2017 1433      Wt Readings from Last 3 Encounters:  12/12/18 243 lb 12.8 oz (110.6 kg)  12/06/17 225 lb 6.4 oz (102.2 kg)  10/29/17 222 lb (100.7 kg)      Other studies Reviewed: Additional studies/ records that were reviewed today include: . Review of the above records demonstrates:    ASSESSMENT AND PLAN:  1. Atrial fib - he is in chronic atrial fibrillation.  CHADS2VASC is 2 ( HTN, diastolic CHF )   He remains very stable and is on Eliquis. I discussed with our pharmacist.  He will be okay to hold his Eliquis the entire day before his procedure.  He should restart the Eliquis a day or so after the procedure-and restart the Eliquis as soon as okay with the urologist-hopefully within a day or so.   2. Chronic diastolic CHF -   he has normal left ventricular systolic function by echo several years ago.  His clinical symptoms have not changed.  Continue current medications.  3. Essential HTN -     BP is well controlled     4. Intracranial hemorrhage   5. DM 6. COPD    Current medicines are reviewed at length with the patient today.  The patient does not have concerns regarding medicines.  The following changes have been made:  no change  Labs/ tests ordered today include:  No orders of the defined types were placed in this encounter.   Disposition:   FU with Scott in 1 year    Mertie Moores, MD  12/12/2018 11:37 AM    [address], [place], [place]  [address] Phone: [phone]; Fax: [phone]

## 2018-12-13 ENCOUNTER — Telehealth: Payer: Self-pay | Admitting: Cardiovascular Disease

## 2018-12-13 LAB — BASIC METABOLIC PANEL
BUN/Creatinine Ratio: 20 (ref 10–24)
BUN: 27 mg/dL (ref 8–27)
CO2: 21 mmol/L (ref 20–29)
Calcium: 10.2 mg/dL (ref 8.6–10.2)
Chloride: 100 mmol/L (ref 96–106)
Creatinine, Ser: 1.38 mg/dL — ABNORMAL HIGH (ref 0.76–1.27)
GFR calc Af Amer: 62 mL/min/{1.73_m2} (ref >59)
GFR calc non Af Amer: 54 mL/min/{1.73_m2} — ABNORMAL LOW (ref >59)
Glucose: 140 mg/dL — ABNORMAL HIGH (ref 65–99)
Potassium: 4.8 mmol/L (ref 3.5–5.2)
Sodium: 137 mmol/L (ref 134–144)

## 2018-12-13 NOTE — Telephone Encounter (Signed)
Our office tried to reach out x 2 to [facility] Roanna Banning, though no answer. We were calling to let them know we have efaxed surgery clearance over today to fax # [phone] attn: Roanna Banning. I will remove from the pre op call back pool.

## 2018-12-13 NOTE — Telephone Encounter (Signed)
Elmyra Ricks from [facility] calling in regards to  Patients surgical clearance appt he had yesterday with Dr. Acie Fredrickson. She states she has not received anything stating that he is ready to be scheduled for his procedure.   Fax Number: [phone]  Attn to: Roanna Banning

## 2018-12-13 NOTE — Telephone Encounter (Signed)
   Primary Cardiologist: Mertie Moores, MD  Chart reviewed as part of pre-operative protocol coverage. Given past medical history and time since last visit, based on ACC/AHA guidelines, [name] would be at acceptable risk for the planned procedure without further cardiovascular testing.   Pt was seen in the office by Dr Acie Fredrickson on 12/12/2018. He remains very stable and is on Eliquis. Discussed with our pharmacist (see below note).  He will be okay to hold his Eliquis the entire day before his procedure.  He should restart the Eliquis a day or so after the procedure-and restart the Eliquis as soon as okay with the urologist-hopefully within a day or so.  I will route this recommendation to the requesting party via Epic fax function and remove from pre-op pool.  Please call with questions.  Daune Perch, NP 12/13/2018, 10:18 AM

## 2018-12-27 NOTE — Telephone Encounter (Signed)
[name] from [facility] called back stating they still have not received clearance. She requested we please send it again stating Attn: [name] at the Fax # [phone]

## 2018-12-27 NOTE — Telephone Encounter (Signed)
I will manually fax clearance over [facility]

## 2018-12-27 NOTE — Telephone Encounter (Signed)
I tried to call Roanna Banning with [facility] to let her know that I have manually faxed clearance notes to their office today. I was not able to reach [place].

## 2019-01-02 NOTE — Telephone Encounter (Signed)
Elmyra Ricks from [facility] is calling in regards to still not receiving patients clearance she was told they would receive by 12/27/18. It can be faxed to [phone].

## 2019-01-02 NOTE — Telephone Encounter (Signed)
I s/w Elmyra Ricks at [facility] and stated that our office did e-fax clearance 2 weeks ago. I confirmed fax number is correct. I will manually fax clearance to Elmyra Ricks today to fax # [phone].

## 2019-02-16 ENCOUNTER — Other Ambulatory Visit: Payer: Self-pay | Admitting: Internal Medicine

## 2019-02-16 DIAGNOSIS — I10 Essential (primary) hypertension: Secondary | ICD-10-CM

## 2019-02-16 DIAGNOSIS — K2100 Gastro-esophageal reflux disease with esophagitis, without bleeding: Secondary | ICD-10-CM

## 2019-02-16 DIAGNOSIS — I2510 Atherosclerotic heart disease of native coronary artery without angina pectoris: Secondary | ICD-10-CM

## 2019-04-27 ENCOUNTER — Other Ambulatory Visit: Payer: Self-pay | Admitting: Internal Medicine

## 2019-04-27 DIAGNOSIS — I482 Chronic atrial fibrillation, unspecified: Secondary | ICD-10-CM

## 2019-05-04 ENCOUNTER — Telehealth: Payer: Self-pay | Admitting: Cardiovascular Disease

## 2019-05-04 NOTE — Telephone Encounter (Signed)
 *  STAT* If patient is at the pharmacy, call can be transferred to refill team.   1. Which medications need to be refilled? (please list name of each medication and dose if known) diltiazem (CARDIZEM CD) 180 MG 24 hr capsule  2. Which pharmacy/location (including street and city if local pharmacy) is medication to be sent to? [place], [place]  3. Do they need a 30 day or 90 day supply? 90 days

## 2019-05-05 ENCOUNTER — Other Ambulatory Visit: Payer: Self-pay

## 2019-05-05 DIAGNOSIS — I482 Chronic atrial fibrillation, unspecified: Secondary | ICD-10-CM

## 2019-05-05 MED ORDER — DILTIAZEM HCL ER COATED BEADS 180 MG PO CP24: 360.0000 mg | ORAL_CAPSULE | Freq: Every day | ORAL | 3 refills | Status: DC

## 2019-06-13 ENCOUNTER — Other Ambulatory Visit: Payer: Self-pay

## 2019-06-13 ENCOUNTER — Encounter: Payer: Self-pay | Admitting: Podiatry

## 2019-06-13 ENCOUNTER — Ambulatory Visit (INDEPENDENT_AMBULATORY_CARE_PROVIDER_SITE_OTHER): Payer: Medicare Other | Admitting: Podiatry

## 2019-06-13 VITALS — BP 141/87 | HR 83

## 2019-06-13 DIAGNOSIS — B351 Tinea unguium: Secondary | ICD-10-CM

## 2019-06-13 DIAGNOSIS — L84 Corns and callosities: Secondary | ICD-10-CM | POA: Diagnosis not present

## 2019-06-13 DIAGNOSIS — E0822 Diabetes mellitus due to underlying condition with diabetic chronic kidney disease: Secondary | ICD-10-CM | POA: Diagnosis not present

## 2019-06-13 DIAGNOSIS — M2011 Hallux valgus (acquired), right foot: Secondary | ICD-10-CM

## 2019-06-13 DIAGNOSIS — Z794 Long term (current) use of insulin: Secondary | ICD-10-CM

## 2019-06-13 DIAGNOSIS — N183 Chronic kidney disease, stage 3 unspecified: Secondary | ICD-10-CM

## 2019-06-13 DIAGNOSIS — M2041 Other hammer toe(s) (acquired), right foot: Secondary | ICD-10-CM | POA: Diagnosis not present

## 2019-06-13 DIAGNOSIS — E119 Type 2 diabetes mellitus without complications: Secondary | ICD-10-CM

## 2019-06-13 DIAGNOSIS — M79674 Pain in right toe(s): Secondary | ICD-10-CM

## 2019-06-13 DIAGNOSIS — M79675 Pain in left toe(s): Secondary | ICD-10-CM | POA: Diagnosis not present

## 2019-06-13 DIAGNOSIS — M2042 Other hammer toe(s) (acquired), left foot: Secondary | ICD-10-CM

## 2019-06-13 DIAGNOSIS — M2012 Hallux valgus (acquired), left foot: Secondary | ICD-10-CM

## 2019-06-13 NOTE — Patient Instructions (Addendum)
Diabetes Mellitus and Foot Care Foot care is an important part of your health, especially when you have diabetes. Diabetes may cause you to have problems because of poor blood flow (circulation) to your feet and legs, which can cause your skin to:  Become thinner and drier.  Break more easily.  Heal more slowly.  Peel and crack. You may also have nerve damage (neuropathy) in your legs and feet, causing decreased feeling in them. This means that you may not notice minor injuries to your feet that could lead to more serious problems. Noticing and addressing any potential problems early is the best way to prevent future foot problems. How to care for your feet Foot hygiene  Wash your feet daily with warm water and mild soap. Do not use hot water. Then, pat your feet and the areas between your toes until they are completely dry. Do not soak your feet as this can dry your skin.  Trim your toenails straight across. Do not dig under them or around the cuticle. File the edges of your nails with an emery board or nail file.  Apply a moisturizing lotion or petroleum jelly to the skin on your feet and to dry, brittle toenails. Use lotion that does not contain alcohol and is unscented. Do not apply lotion between your toes. Shoes and socks  Wear clean socks or stockings every day. Make sure they are not too tight. Do not wear knee-high stockings since they may decrease blood flow to your legs.  Wear shoes that fit properly and have enough cushioning. Always look in your shoes before you put them on to be sure there are no objects inside.  To break in new shoes, wear them for just a few hours a day. This prevents injuries on your feet. Wounds, scrapes, corns, and calluses  Check your feet daily for blisters, cuts, bruises, sores, and redness. If you cannot see the bottom of your feet, use a mirror or ask someone for help.  Do not cut corns or calluses or try to remove them with medicine.  If you  find a minor scrape, cut, or break in the skin on your feet, keep it and the skin around it clean and dry. You may clean these areas with mild soap and water. Do not clean the area with peroxide, alcohol, or iodine.  If you have a wound, scrape, corn, or callus on your foot, look at it several times a day to make sure it is healing and not infected. Check for: ? Redness, swelling, or pain. ? Fluid or blood. ? Warmth. ? Pus or a bad smell. General instructions  Do not cross your legs. This may decrease blood flow to your feet.  Do not use heating pads or hot water bottles on your feet. They may burn your skin. If you have lost feeling in your feet or legs, you may not know this is happening until it is too late.  Protect your feet from hot and cold by wearing shoes, such as at the beach or on hot pavement.  Schedule a complete foot exam at least once a year (annually) or more often if you have foot problems. If you have foot problems, report any cuts, sores, or bruises to your health care provider immediately. Contact a health care provider if:  You have a medical condition that increases your risk of infection and you have any cuts, sores, or bruises on your feet.  You have an injury that is not   healing.  You have redness on your legs or feet.  You feel burning or tingling in your legs or feet.  You have pain or cramps in your legs and feet.  Your legs or feet are numb.  Your feet always feel cold.  You have pain around a toenail. Get help right away if:  You have a wound, scrape, corn, or callus on your foot and: ? You have pain, swelling, or redness that gets worse. ? You have fluid or blood coming from the wound, scrape, corn, or callus. ? Your wound, scrape, corn, or callus feels warm to the touch. ? You have pus or a bad smell coming from the wound, scrape, corn, or callus. ? You have a fever. ? You have a red line going up your leg. Summary  Check your feet every day  for cuts, sores, red spots, swelling, and blisters.  Moisturize feet and legs daily.  Wear shoes that fit properly and have enough cushioning.  If you have foot problems, report any cuts, sores, or bruises to your health care provider immediately.  Schedule a complete foot exam at least once a year (annually) or more often if you have foot problems. This information is not intended to replace advice given to you by your health care provider. Make sure you discuss any questions you have with your health care provider. Document Revised: 09/21/2018 Document Reviewed: 01/31/2016 Elsevier Patient Education  [place].  Chronic Kidney Disease, Adult Chronic kidney disease (CKD) occurs when the kidneys become damaged slowly over a long period of time. The kidneys are a pair of organs that do many important jobs in the body, including:  Removing waste and extra fluid from the blood to make urine.  Making hormones that maintain the amount of fluid in tissues and blood vessels.  Maintaining the right amount of fluids and chemicals in the body. A small amount of kidney damage may not cause problems, but a large amount of damage may make it hard or impossible for the kidneys to work the way they should. If steps are not taken to slow down kidney damage or to stop it from getting worse, the kidneys may stop working permanently (end-stage renal disease or ESRD). Most of the time, CKD does not go away, but it can often be controlled. People who have CKD are usually able to live normal lives. What are the causes? The most common causes of this condition are diabetes and high blood pressure (hypertension). Other causes include:  Heart and blood vessel (cardiovascular) disease.  Kidney diseases, such as: ? Glomerulonephritis. ? Interstitial nephritis. ? Polycystic kidney disease. ? Renal vascular disease.  Diseases that affect the immune system.  Genetic diseases.  Medicines that damage  the kidneys, such as anti-inflammatory medicines.  Being around or being in contact with poisonous (toxic) substances.  A kidney or urinary infection that occurs again and again (recurs).  Vasculitis. This is swelling or inflammation of the blood vessels.  A problem with urine flow that may be caused by: ? Cancer. ? Having kidney stones more than one time. ? An enlarged prostate, in males. What increases the risk? You are more likely to develop this condition if you:  Are older than age 68.  Are male.  Are African-American, Hispanic, Asian, [place], or American [place].  Are a current or former smoker.  Are obese.  Have a family history of kidney disease or failure.  Often take medicines that are damaging to the  kidneys. What are the signs or symptoms? Symptoms of this condition include:  Swelling (edema) of the face, legs, ankles, or feet.  Tiredness (lethargy) and having less energy.  Nausea or vomiting.  Confusion or trouble concentrating.  Problems with urination, such as: ? Painful or burning feeling during urination. ? Decreased urine production. ? Frequent urination, especially at night. ? Bloody urine.  Muscle twitches and cramps, especially in the legs.  Shortness of breath.  Weakness.  Loss of appetite.  Metallic taste in the mouth.  Trouble sleeping.  Dry, itchy skin.  A low blood count (anemia).  Pale lining of the eyelids and surface of the eye (conjunctiva). Symptoms develop slowly and may not be obvious until the kidney damage becomes severe. It is possible to have kidney disease for years without having any symptoms. How is this diagnosed? This condition may be diagnosed based on:  Blood tests.  Urine tests.  Imaging tests, such as an ultrasound or CT scan.  A test in which a sample of tissue is removed from the kidneys to be examined under a microscope (kidney biopsy). These test results will help your health care  provider determine how serious the CKD is. How is this treated? There is no cure for most cases of this condition, but treatment usually relieves symptoms and prevents or slows the progression of the disease. Treatment may include:  Making diet changes, which may require you to avoid alcohol, salty foods (sodium), and foods that are high in potassium, calcium, and protein.  Medicines: ? To lower blood pressure. ? To control blood glucose. ? To relieve anemia. ? To relieve swelling. ? To protect your bones. ? To improve the balance of electrolytes in your blood.  Removing toxic waste from the body through types of dialysis, if the kidneys can no longer do their job (kidney failure).  Managing any other conditions that are causing your CKD or making it worse. Follow these instructions at home: Medicines  Take over-the-counter and prescription medicines only as told by your health care provider. The dose of some medicines that you take may need to be adjusted.  Do not take any new medicines unless approved by your health care provider. Many medicines can worsen your kidney damage.  Do not take any vitamin and mineral supplements unless approved by your health care provider. Many nutritional supplements can worsen your kidney damage. General instructions  Follow your prescribed diet as told by your health care provider.  Do not use any products that contain nicotine or tobacco, such as cigarettes and e-cigarettes. If you need help quitting, ask your health care provider.  Monitor and track your blood pressure at home. Report changes in your blood pressure as told by your health care provider.  If you are being treated for diabetes, monitor and track your blood sugar (blood glucose) levels as told by your health care provider.  Maintain a healthy weight. If you need help with this, ask your health care provider.  Start or continue an exercise plan. Exercise at least 30 minutes a day,  5 days a week.  Keep your immunizations up to date as told by your health care provider.  Keep all follow-up visits as told by your health care provider. This is important. Where to find more information  American Association of Kidney Patients: BombTimer.gl  National Kidney Foundation: www.kidney.[place]: https://mathis.com/  [facility]: www.lifeoptions.org and www.kidneyschool.org Contact a health care provider if:  Your symptoms get  worse.  You develop new symptoms. Get help right away if:  You develop symptoms of ESRD, which include: ? Headaches. ? Numbness in the hands or feet. ? Easy bruising. ? Frequent hiccups. ? Chest pain. ? Shortness of breath. ? Lack of menstruation, in women.  You have a fever.  You have decreased urine production.  You have pain or bleeding when you urinate. Summary  Chronic kidney disease (CKD) occurs when the kidneys become damaged slowly over a long period of time.  The most common causes of this condition are diabetes and high blood pressure (hypertension).  There is no cure for most cases of this condition, but treatment usually relieves symptoms and prevents or slows the progression of the disease. Treatment may include a combination of medicines and lifestyle changes. This information is not intended to replace advice given to you by your health care provider. Make sure you discuss any questions you have with your health care provider. Document Revised: 12/11/2016 Document Reviewed: 02/06/2016 Elsevier Patient Education  [place]  A bunion is a bump on the base of the big toe that forms when the bones of the big toe joint move out of position. Bunions may be small at first, but they often get larger over time. They can make walking painful. What are the causes? A bunion may be caused by:  Wearing narrow or pointed shoes that force the big toe to press against the other  toes.  Abnormal foot development that causes the foot to roll inward (pronate).  Changes in the foot that are caused by certain diseases, such as rheumatoid arthritis or polio.  A foot injury. What increases the risk? The following factors may make you more likely to develop this condition:  Wearing shoes that squeeze the toes together.  Having certain diseases, such as: ? Rheumatoid arthritis. ? Polio. ? Cerebral palsy.  Having family members who have bunions.  Being born with a foot deformity, such as flat feet or low arches.  Doing activities that put a lot of pressure on the feet, such as ballet dancing. What are the signs or symptoms? The main symptom of a bunion is a noticeable bump on the big toe. Other symptoms may include:  Pain.  Swelling around the big toe.  Redness and inflammation.  Thick or hardened skin on the big toe or between the toes.  Stiffness or loss of motion in the big toe.  Trouble with walking. How is this diagnosed? A bunion may be diagnosed based on your symptoms, medical history, and activities. You may have tests, such as:  X-rays. These allow your health care provider to check the position of the bones in your foot and look for damage to your joint. They also help your health care provider determine the severity of your bunion and the best way to treat it.  Joint aspiration. In this test, a sample of fluid is removed from the toe joint. This test may be done if you are in a lot of pain. It helps rule out diseases that cause painful swelling of the joints, such as arthritis. How is this treated? Treatment depends on the severity of your symptoms. The goal of treatment is to relieve symptoms and prevent the bunion from getting worse. Your health care provider may recommend:  Wearing shoes that have a wide toe box.  Using bunion pads to cushion the affected area.  Taping your toes together to keep them in a normal position.  Placing a  device inside your shoe (orthotics) to help reduce pressure on your toe joint.  Taking medicine to ease pain, inflammation, and swelling.  Applying heat or ice to the affected area.  Doing stretching exercises.  Surgery to remove scar tissue and move the toes back into their normal position. This treatment is rare. Follow these instructions at home: Managing pain, stiffness, and swelling   If directed, put ice on the painful area: ? Put ice in a plastic bag. ? Place a towel between your skin and the bag. ? Leave the ice on for 20 minutes, 2-3 times a day. Activity   If directed, apply heat to the affected area before you exercise. Use the heat source that your health care provider recommends, such as a moist heat pack or a heating pad. ? Place a towel between your skin and the heat source. ? Leave the heat on for 20-30 minutes. ? Remove the heat if your skin turns bright red. This is especially important if you are unable to feel pain, heat, or cold. You may have a greater risk of getting burned.  Do exercises as told by your health care provider. General instructions  Support your toe joint with proper footwear, shoe padding, or taping as told by your health care provider.  Take over-the-counter and prescription medicines only as told by your health care provider.  Keep all follow-up visits as told by your health care provider. This is important. Contact a health care provider if your symptoms:  Get worse.  Do not improve in 2 weeks. Get help right away if you have:  Severe pain and trouble with walking. Summary  A bunion is a bump on the base of the big toe that forms when the bones of the big toe joint move out of position.  Bunions can make walking painful.  Treatment depends on the severity of your symptoms.  Support your toe joint with proper footwear, shoe padding, or taping as told by your health care provider. This information is not intended to replace  advice given to you by your health care provider. Make sure you discuss any questions you have with your health care provider. Document Revised: 07/05/2017 Document Reviewed: 05/11/2017 Elsevier Patient Education  [place]  Hammer toe is a change in the shape (a deformity) of your toe. The deformity causes the middle joint of your toe to stay bent. This causes pain, especially when you are wearing shoes. Hammer toe starts gradually. At first, the toe can be straightened. Gradually over time, the deformity becomes stiff and permanent. Early treatments to keep the toe straight may relieve pain. As the deformity becomes stiff and permanent, surgery may be needed to straighten the toe. What are the causes? Hammer toe is caused by abnormal bending of the toe joint that is closest to your foot. It happens gradually over time. This pulls on the muscles and connections (tendons) of the toe joint, making them weak and stiff. It is often related to wearing shoes that are too short or narrow and do not let your toes straighten. What increases the risk? You may be at greater risk for hammer toe if you:  Are male.  Are older.  Wear shoes that are too small.  Wear high-heeled shoes that pinch your toes.  Are a Engineer, mining.  Have a second toe that is longer than your big toe (first toe).  Injure your foot or toe.  Have arthritis.  Have a family history of hammer toe.  Have a nerve or muscle disorder. What are the signs or symptoms? The main symptoms of this condition are pain and deformity of the toe. The pain is worse when wearing shoes, walking, or running. Other symptoms may include:  Corns or calluses over the bent part of the toe or between the toes.  Redness and a burning feeling on the toe.  An open sore that forms on the top of the toe.  Not being able to straighten the toe. How is this diagnosed? This condition is diagnosed based on your symptoms and a  physical exam. During the exam, your health care provider will try to straighten your toe to see how stiff the deformity is. You may also have tests, such as:  A blood test to check for rheumatoid arthritis.  An X-ray to show how severe the deformity is. How is this treated? Treatment for this condition will depend on how stiff the deformity is. Surgery is often needed. However, sometimes a hammer toe can be straightened without surgery. Treatments that do not involve surgery include:  Taping the toe into a straightened position.  Using pads and cushions to protect the toe (orthotics).  Wearing shoes that provide enough room for the toes.  Doing toe-stretching exercises at home.  Taking an NSAID to reduce pain and swelling. If these treatments do not help or the toe cannot be straightened, surgery is the next option. The most common surgeries used to straighten a hammer toe include:  Arthroplasty. In this procedure, part of the joint is removed, and that allows the toe to straighten.  Fusion. In this procedure, cartilage between the two bones of the joint is taken out and the bones are fused together into one longer bone.  Implantation. In this procedure, part of the bone is removed and replaced with an implant to let the toe move again.  Flexor tendon transfer. In this procedure, the tendons that curl the toes down (flexor tendons) are repositioned. Follow these instructions at home:  Take over-the-counter and prescription medicines only as told by your health care provider.  Do toe straightening and stretching exercises as told by your health care provider.  Keep all follow-up visits as told by your health care provider. This is important. How is this prevented?  Wear shoes that give your toes enough room and do not cause pain.  Do not wear high-heeled shoes. Contact a health care provider if:  Your pain gets worse.  Your toe becomes red or swollen.  You develop an open  sore on your toe. This information is not intended to replace advice given to you by your health care provider. Make sure you discuss any questions you have with your health care provider. Document Revised: 12/11/2016 Document Reviewed: 04/24/2015 Elsevier Patient Education  2020 [employer].

## 2019-06-18 NOTE — Progress Notes (Signed)
Subjective: [name] presents today with cc of painful, discolored, thick toenails which interfere with daily activities.  Pain is aggravated when wearing enclosed shoe gear.   He states he has been going to nail salons since 2017.   Past Medical History:  Diagnosis Date  . A-fib ([place]) 11/19/2010  . Acute exacerbation of congestive heart failure ([place]) 11/19/2010  . Arthritis   . CAD (coronary artery disease) 11/20/2010  . CHF (congestive heart failure) ([place])   . CKD (chronic kidney disease) stage 3, GFR 30-59 ml/min 05/04/2017  . Coronary artery disease   . Diabetes mellitus   . Diabetes mellitus 11/19/2010  . Diabetes type 2, controlled ([place]) 11/19/2010  . GERD (gastroesophageal reflux disease) 02/08/2015  . Gout   . Gout 11/20/2010  . History of cerebrovascular accident (CVA) with residual deficit 05/04/2017  . HTN (hypertension) 07/01/2012  . Hyperlipemia 11/21/2010  . Hypertension   . Hypertensive emergency 11/19/2010  . ICH (intracerebral hemorrhage) ([place]) 12/22/2010  . Physical deconditioning 12/22/2010  . Pulmonary edema 11/19/2010  . Respiratory failure ([place]) 11/19/2010  . Shortness of breath   . Stroke ([place]) 11/19/2010  . Thyroiditis 11/20/2010     Patient Active Problem List   Diagnosis Date Noted  . Chronic diastolic CHF (congestive heart failure) ([place]) 10/28/2017  . History of cerebrovascular accident (CVA) with residual deficit 05/04/2017  . CKD (chronic kidney disease) stage 3, GFR 30-59 ml/min 05/04/2017  . GERD (gastroesophageal reflux disease) 02/08/2015  . HTN (hypertension) 07/01/2012  . ICH (intracerebral hemorrhage) ([place]) 12/22/2010  . Hyperlipemia 11/21/2010  . Gout 11/20/2010  . CAD (coronary artery disease) 11/20/2010  . Stroke ([place]) 11/19/2010  . Diabetes type 2, controlled ([place]) 11/19/2010  . Permanent atrial fibrillation 11/19/2010     Past Surgical History:  Procedure Laterality Date  . PEG PLACEMENT  12/03/2010   Procedure: PERCUTANEOUS  ENDOSCOPIC GASTROSTOMY (PEG) PLACEMENT;  Surgeon: Lafayette Dragon, MD;  Location: [facility] ENDOSCOPY;  Service: Endoscopy;  Laterality: N/A;  . TRACHEOSTOMY TUBE PLACEMENT  11/28/2010   Procedure: TRACHEOSTOMY;  Surgeon: Beckie Salts, MD;  Location: [place];  Service: ENT;  Laterality: N/A;     Current Outpatient Medications on File Prior to Visit  Medication Sig Dispense Refill  . Alcohol Swabs (ALCOHOL PREP) 70 % PADS     . Blood Glucose Monitoring Suppl (ONETOUCH VERIO) w/Device KIT Use as directed to test blood sugar once daily 1 kit 0  . carvedilol (COREG) 25 MG tablet Take 1 tablet (25 mg total) by mouth 2 (two) times daily with a meal. 180 tablet 3  . diltiazem (CARDIZEM CD) 180 MG 24 hr capsule Take 2 capsules (360 mg total) by mouth daily. At noon 180 capsule 3  . ELIQUIS 5 MG TABS tablet TAKE 1 TABLET BY MOUTH  TWICE DAILY 60 tablet 0  . furosemide (LASIX) 40 MG tablet Take 40 mg by mouth as needed.    Marland Kitchen glucose blood (ONETOUCH VERIO) test strip Use as directed to test blood sugar once daily 100 each 12  . hydrALAZINE (APRESOLINE) 50 MG tablet Take 1 tablet (50 mg total) by mouth 2 (two) times daily. 180 tablet 3  . isosorbide mononitrate (IMDUR) 60 MG 24 hr tablet Take 1 tablet (60 mg total) by mouth daily. 90 tablet 3  . metFORMIN (GLUCOPHAGE) 850 MG tablet TAKE 1 TABLET BY MOUTH TWO  TIMES DAILY WITH MEALS 180 tablet 3  . ONETOUCH DELICA LANCETS 75P MISC Use as directed to test blood  sugar once daily 100 each 12  . pantoprazole (PROTONIX) 40 MG tablet TAKE 1 TABLET BY MOUTH  DAILY AT 12 NOON. 90 tablet 3  . pravastatin (PRAVACHOL) 40 MG tablet Take 40 mg by mouth daily.    Marland Kitchen spironolactone (ALDACTONE) 25 MG tablet Take 1 tablet (25 mg total) by mouth daily. 90 tablet 3   No current facility-administered medications on file prior to visit.     No Known Allergies   Social History   Occupational History  . Occupation: Disability  Tobacco Use  . Smoking status: Former Smoker     Types: Cigarettes    Quit date: 12/19/2005    Years since quitting: 13.5  . Smokeless tobacco: Never Used  Substance and Sexual Activity  . Alcohol use: No    Alcohol/week: 11.0 standard drinks    Types: 6 Glasses of wine, 5 Shots of liquor per week    Comment: quit in 2012  . Drug use: No  . Sexual activity: Not on file     Family History  Problem Relation Age of Onset  . Diabetes Mother   . Diabetes Brother   . Colon cancer Neg Hx   . Colon polyps Neg Hx   . Kidney disease Neg Hx   . Esophageal cancer Neg Hx   . Heart disease Neg Hx   . Gallbladder disease Neg Hx      Immunization History  Administered Date(s) Administered  . Influenza Whole 11/21/2010  . Influenza,inj,Quad PF,6+ Mos 12/07/2012, 11/27/2013, 02/08/2015, 11/05/2016, 10/29/2017  . Pneumococcal Polysaccharide-23 11/21/2010     Review of systems: Positive Findings in bold print.  Constitutional:  chills, fatigue, fever, sweats, weight change Communication: Optometrist, sign Ecologist, hand writing, iPad/Android device Head: headaches, head injury Eyes: changes in vision, eye pain, glaucoma, cataracts, macular degeneration, diplopia, glare,  light sensitivity, eyeglasses or contacts, blindness Ears nose mouth throat: hearing impaired, hearing aids,  ringing in ears, deaf, sign language,  vertigo,   nosebleeds,  rhinitis,  cold sores, snoring, swollen glands Cardiovascular: HTN, edema, arrhythmia, pacemaker in place, defibrillator in place, chest pain/tightness, chronic anticoagulation, blood clot, heart failure, MI Peripheral Vascular: leg cramps, varicose veins, blood clots, lymphedema, varicosities Respiratory:  difficulty breathing, denies congestion, SOB, wheezing, cough, emphysema Gastrointestinal: change in appetite or weight, abdominal pain, constipation, diarrhea, nausea, vomiting, vomiting blood, change in bowel habits, abdominal pain, jaundice, rectal bleeding, hemorrhoids, GERD Genitourinary:   nocturia,  pain on urination, polyuria,  blood in urine, Foley catheter, urinary urgency, ESRD on hemodialysis Musculoskeletal: amputation, cramping, stiff joints, painful joints, decreased joint motion, fractures, OA, gout, hemiplegia, paraplegia, uses cane, wheelchair bound, uses walker, uses rollator Skin: +changes in toenails, color change, dryness, itching, mole changes,  rash, wound(s) Neurological: headaches, numbness in feet, paresthesias in feet, burning in feet, fainting,  seizures, change in speech. denies headaches, memory problems/poor historian, cerebral palsy, weakness, paralysis, CVA, TIA Endocrine: diabetes, hypothyroidism, hyperthyroidism,  goiter, dry mouth, flushing, heat intolerance,  cold intolerance,  excessive thirst, denies polyuria,  nocturia Hematological:  easy bleeding, excessive bleeding, easy bruising, enlarged lymph nodes, on long term blood thinner, history of past transusions Allergy/immunological:  hives, eczema, frequent infections, multiple drug allergies, seasonal allergies, transplant recipient, multiple food allergies Psychiatric:  anxiety, depression, mood disorder, suicidal ideations, hallucinations, insomnia  Objective: Vitals:   06/13/19 1447  BP: (!) 141/87  Pulse: 50    64 y.o. male WD, WN IN NAD. AAO X 3.  Vascular Examination: Capillary refill time to digits immediate  b/l. Palpable DP pulses b/l. Faintly palpable PT pulses b/l. Pedal hair absent b/l. Skin temperature gradient within normal limits b/l. Edema b/l ankles.  Dermatological Examination: Pedal skin with normal turgor, texture and tone bilaterally. No open wounds bilaterally. No interdigital macerations bilaterally. Toenails 1-5 b/l elongated, discolored, dystrophic, thickened, crumbly with subungual debris and tenderness to dorsal palpation. Hyperkeratotic lesion(s) L 5th toe and R 5th toe.  No erythema, no edema, no drainage, no flocculence.  Musculoskeletal Examination: Normal  muscle strength 5/5 to all lower extremity muscle groups bilaterally. No pain crepitus or joint limitation noted with ROM b/l. Hallux valgus with bunion deformity noted b/l lower extremities. Hammertoes noted to the L 5th toe and R 5th toe.   Footwear Assessment: Does the patient wear appropriate shoes? Yes. Does the patient need inserts/orthotics? No.  Neurological Examination: Protective sensation intact 5/5 intact bilaterally with 10g monofilament b/l. Vibratory sensation intact b/l. Proprioception intact bilaterally.  Assessment: 1. Pain due to onychomycosis of toenails of both feet   2. Corns   3. Hallux valgus, acquired, bilateral   4. Acquired hammertoes of both feet   5. Diabetes mellitus due to underlying condition with stage 3 chronic kidney disease, with long-term current use of insulin, unspecified whether stage 3a or 3b CKD ([place])   6. Encounter for diabetic foot exam ([place])   Risk Categorization: Low Risk :  Patient has all of the following: Intact protective sensation No prior foot ulcer  No severe deformity Pedal pulses present Plan: -Examined patient. -Diabetic foot examination performed on today's visit. -Toenails 1-5 b/l were debrided in length and girth with sterile nail nippers and dremel without iatrogenic bleeding.  -Corn(s) L 5th toe and R 5th toe pared utilizing sterile scalpel blade without complication or incident. Total number debrided=2. -Patient to continue soft, supportive shoe gear daily. -Patient to report any pedal injuries to medical professional immediately. -Patient/POA to call should there be question/concern in the interim.  Return in about 3 months (around 09/13/2019) for diabetic nail trim.

## 2019-06-27 ENCOUNTER — Ambulatory Visit: Payer: Medicare Other | Admitting: Orthotics

## 2019-06-27 ENCOUNTER — Other Ambulatory Visit: Payer: Self-pay

## 2019-06-27 DIAGNOSIS — M2012 Hallux valgus (acquired), left foot: Secondary | ICD-10-CM

## 2019-06-27 DIAGNOSIS — L84 Corns and callosities: Secondary | ICD-10-CM

## 2019-06-27 DIAGNOSIS — M2011 Hallux valgus (acquired), right foot: Secondary | ICD-10-CM

## 2019-06-27 DIAGNOSIS — M2041 Other hammer toe(s) (acquired), right foot: Secondary | ICD-10-CM

## 2019-06-27 DIAGNOSIS — M2042 Other hammer toe(s) (acquired), left foot: Secondary | ICD-10-CM

## 2019-06-27 NOTE — Progress Notes (Signed)

## 2019-08-07 ENCOUNTER — Other Ambulatory Visit: Payer: Self-pay

## 2019-08-07 ENCOUNTER — Ambulatory Visit: Payer: Medicare Other | Admitting: Orthotics

## 2019-08-07 DIAGNOSIS — L84 Corns and callosities: Secondary | ICD-10-CM

## 2019-08-07 DIAGNOSIS — M2041 Other hammer toe(s) (acquired), right foot: Secondary | ICD-10-CM

## 2019-08-07 DIAGNOSIS — M2011 Hallux valgus (acquired), right foot: Secondary | ICD-10-CM

## 2019-08-07 DIAGNOSIS — M2042 Other hammer toe(s) (acquired), left foot: Secondary | ICD-10-CM

## 2019-08-07 NOTE — Progress Notes (Signed)
Re order through [facility] 11.5 2E.

## 2019-08-15 ENCOUNTER — Ambulatory Visit (INDEPENDENT_AMBULATORY_CARE_PROVIDER_SITE_OTHER): Payer: Medicare Other | Admitting: Orthotics

## 2019-08-15 ENCOUNTER — Other Ambulatory Visit: Payer: Self-pay

## 2019-08-15 DIAGNOSIS — E0822 Diabetes mellitus due to underlying condition with diabetic chronic kidney disease: Secondary | ICD-10-CM

## 2019-08-15 DIAGNOSIS — M2042 Other hammer toe(s) (acquired), left foot: Secondary | ICD-10-CM

## 2019-08-15 DIAGNOSIS — N183 Chronic kidney disease, stage 3 unspecified: Secondary | ICD-10-CM

## 2019-08-15 DIAGNOSIS — M2011 Hallux valgus (acquired), right foot: Secondary | ICD-10-CM

## 2019-08-15 DIAGNOSIS — M2041 Other hammer toe(s) (acquired), right foot: Secondary | ICD-10-CM

## 2019-08-15 DIAGNOSIS — Z794 Long term (current) use of insulin: Secondary | ICD-10-CM

## 2019-08-15 DIAGNOSIS — L84 Corns and callosities: Secondary | ICD-10-CM

## 2019-08-15 DIAGNOSIS — M2012 Hallux valgus (acquired), left foot: Secondary | ICD-10-CM

## 2019-08-15 NOTE — Progress Notes (Signed)

## 2019-09-04 ENCOUNTER — Ambulatory Visit: Payer: Medicare Other | Admitting: Podiatry

## 2019-10-13 ENCOUNTER — Other Ambulatory Visit: Payer: Self-pay

## 2019-10-13 DIAGNOSIS — I482 Chronic atrial fibrillation, unspecified: Secondary | ICD-10-CM

## 2019-10-13 MED ORDER — APIXABAN 5 MG PO TABS: 5.0000 mg | ORAL_TABLET | Freq: Two times a day (BID) | ORAL | 5 refills | Status: DC

## 2019-10-13 NOTE — Telephone Encounter (Signed)
Prescription refill request for Eliquis received.  Last office visit: [place],  12/12/2018 Scr: 1.38, 12/12/2018 Age: 64 y.o. Weight: 110.6 kg   Prescription refill sent.

## 2019-12-12 ENCOUNTER — Ambulatory Visit: Payer: Medicare Other | Admitting: Cardiovascular Disease

## 2019-12-12 ENCOUNTER — Encounter: Payer: Self-pay | Admitting: Cardiovascular Disease

## 2019-12-12 ENCOUNTER — Other Ambulatory Visit: Payer: Self-pay

## 2019-12-12 VITALS — BP 130/76 | HR 81 | Ht 71.0 in | Wt 231.8 lb

## 2019-12-12 DIAGNOSIS — I5032 Chronic diastolic (congestive) heart failure: Secondary | ICD-10-CM | POA: Diagnosis not present

## 2019-12-12 DIAGNOSIS — I10 Essential (primary) hypertension: Secondary | ICD-10-CM

## 2019-12-12 DIAGNOSIS — Z7901 Long term (current) use of anticoagulants: Secondary | ICD-10-CM | POA: Diagnosis not present

## 2019-12-12 DIAGNOSIS — I482 Chronic atrial fibrillation, unspecified: Secondary | ICD-10-CM

## 2019-12-12 NOTE — Patient Instructions (Signed)
Medication Instructions:  Your physician recommends that you continue on your current medications as directed. Please refer to the Current Medication list given to you today.  *If you need a refill on your cardiac medications before your next appointment, please call your pharmacy*   Lab Work: TODAY - CBC, BMET, Liver panel, Cholesterol If you have labs (blood work) drawn today and your tests are completely normal, you will receive your results only by: Marland Kitchen MyChart Message (if you have MyChart) OR . A paper copy in the mail If you have any lab test that is abnormal or we need to change your treatment, we will call you to review the results.    Testing/Procedures: None Ordered    Follow-Up: At [facility], you and your health needs are our priority.  As part of our continuing mission to provide you with exceptional heart care, we have created designated Provider Care Teams.  These Care Teams include your primary Cardiologist (physician) and Advanced Practice Providers (APPs -  Physician Assistants and Nurse Practitioners) who all work together to provide you with the care you need, when you need it.   Your next appointment:   1 year(s)  The format for your next appointment:   In Person  Provider:   You may see Mertie Moores, MD or one of the following Advanced Practice Providers on your designated Care Team:    Richardson Dopp, PA-C  [place], [place]

## 2019-12-12 NOTE — Progress Notes (Signed)
Cardiology Office Note   Date:  12/12/2019   ID:  Lathen, Seal Nov 02, 1955, MRN [id number]  PCP:  Andree Moro, DO  Cardiologist:   Mertie Moores, MD  Former patient of Dr. Verl Blalock   Chief Complaint  Patient presents with  . Atrial Fibrillation  . Congestive Heart Failure   Problem List 1. Atrial fib  2. Chronic diastolic CHF 3. Essential HTN 4. Intracranial hemorrhage ( on coumadin )  5. DM 6. COPD      Previous notes:  [name] is a 64 y.o. male who presents for further evaluation of paroxysmal atrial fibrillation.  He was last seen by Dr. Verl Blalock in Dec. 2014.  No CP recently, no dyspnea  Has better fine motor control in the left side of his body   Able to walk with a walker,  Was seen today in a wheelchair.   March 27, 2016:  Now using a walker ( was in a wheelchair at his last visit )  No CP or dyspnea  BP remains low   July 30, 2017:    Follow up for atrial fib .   Has a history of chronic diastolic congestive heart failure.  Also has a history of hypertension, hyperlipidemia.  Is on PRN lasix - is not on potassium  . Breathing is ok .   Legs are very swollen    Nov. 30,  2020   Here for preoperative evaluation prior to prostate biopsy. Is here in a wheelchair.   Uses a walker at home Had a stroke years ago  Takes his lasix just once a month - is concerned that the Lasix may be contributing to his slight increase in creatinine. He has 1-2+ bilateral ankle edema.  He gets around using a walker so some of this edema may be due to the fact that he is relatively inactive from his stroke.  Nov. 30, 2021: [name] is seen today for follow up of his atrial fib  chronic diastolic CHF Is in Afib today .  Has a stroke in the past, and is now on Eliquis  Hx of HTN and HLD.  Had a stroke,  Uses a walker or wheelchair. No cp or dyspnea.  Able to do his   Past Medical History:  Diagnosis Date  . A-fib ([name]) 11/19/2010  . Acute exacerbation of  congestive heart failure ([name]) 11/19/2010  . Arthritis   . CAD (coronary artery disease) 11/20/2010  . CHF (congestive heart failure) ([place])   . CKD (chronic kidney disease) stage 3, GFR 30-59 ml/min ([facility]) 05/04/2017  . Coronary artery disease   . Diabetes mellitus   . Diabetes mellitus 11/19/2010  . Diabetes type 2, controlled ([name]) 11/19/2010  . GERD (gastroesophageal reflux disease) 02/08/2015  . Gout   . Gout 11/20/2010  . History of cerebrovascular accident (CVA) with residual deficit 05/04/2017  . HTN (hypertension) 07/01/2012  . Hyperlipemia 11/21/2010  . Hypertension   . Hypertensive emergency 11/19/2010  . ICH (intracerebral hemorrhage) ([place]) 12/22/2010  . Physical deconditioning 12/22/2010  . Pulmonary edema 11/19/2010  . Respiratory failure ([name]) 11/19/2010  . Shortness of breath   . Stroke ([name]) 11/19/2010  . Thyroiditis 11/20/2010    Past Surgical History:  Procedure Laterality Date  . PEG PLACEMENT  12/03/2010   Procedure: PERCUTANEOUS ENDOSCOPIC GASTROSTOMY (PEG) PLACEMENT;  Surgeon: Lafayette Dragon, MD;  Location: [facility] ENDOSCOPY;  Service: Endoscopy;  Laterality: N/A;  . TRACHEOSTOMY TUBE PLACEMENT  11/28/2010   Procedure: TRACHEOSTOMY;  Surgeon: Beckie Salts, MD;  [facility];  Service: ENT;  Laterality: N/A;     Current Outpatient Medications  Medication Sig Dispense Refill  . Alcohol Swabs (ALCOHOL PREP) 70 % PADS     . apixaban (ELIQUIS) 5 MG TABS tablet Take 1 tablet (5 mg total) by mouth 2 (two) times daily. 60 tablet 5  . Blood Glucose Monitoring Suppl (ONETOUCH VERIO) w/Device KIT Use as directed to test blood sugar once daily 1 kit 0  . carvedilol (COREG) 25 MG tablet Take 1 tablet (25 mg total) by mouth 2 (two) times daily with a meal. 180 tablet 3  . diltiazem (CARDIZEM CD) 180 MG 24 hr capsule Take 2 capsules (360 mg total) by mouth daily. At noon 180 capsule 3  . empagliflozin (JARDIANCE) 10 MG TABS tablet Take 10 mg by mouth daily.    . furosemide (LASIX) 40  MG tablet Take 40 mg by mouth as needed.    Marland Kitchen glucose blood (ONETOUCH VERIO) test strip Use as directed to test blood sugar once daily 100 each 12  . hydrALAZINE (APRESOLINE) 50 MG tablet Take 1 tablet (50 mg total) by mouth 2 (two) times daily. 180 tablet 3  . isosorbide mononitrate (IMDUR) 60 MG 24 hr tablet Take 30 mg by mouth daily.    . metFORMIN (GLUCOPHAGE) 850 MG tablet TAKE 1 TABLET BY MOUTH TWO  TIMES DAILY WITH MEALS 180 tablet 3  . ONETOUCH DELICA LANCETS 74Y MISC Use as directed to test blood sugar once daily 100 each 12  . pantoprazole (PROTONIX) 40 MG tablet TAKE 1 TABLET BY MOUTH  DAILY AT 12 NOON. 90 tablet 3  . pravastatin (PRAVACHOL) 40 MG tablet Take 40 mg by mouth daily.    Marland Kitchen spironolactone (ALDACTONE) 25 MG tablet Take 1 tablet (25 mg total) by mouth daily. 90 tablet 3   No current facility-administered medications for this visit.    Allergies:   Patient has no known allergies.    Social History:  The patient  reports that he quit smoking about 13 years ago. His smoking use included cigarettes. He has never used smokeless tobacco. He reports that he does not drink alcohol and does not use drugs.   Family History:  The patient's family history includes Diabetes in his brother and mother.    ROS: Noted in current history, otherwise review of systems is negative.  Physical Exam: Blood pressure 130/76, pulse 81, height 5' 11"  (1.803 m), weight 231 lb 12.8 oz (105.1 kg), SpO2 97 %.  GEN:  Well nourished, well developed in no acute distress HEENT: Normal NECK: No JVD; No carotid bruits LYMPHATICS: No lymphadenopathy CARDIAC: Irreg. Irreg.  RESPIRATORY:  Clear to auscultation without rales, wheezing or rhonchi  ABDOMEN: Soft, non-tender, non-distended MUSCULOSKELETAL:  No edema; No deformity  SKIN: Warm and dry NEUROLOGIC:  Alert and oriented x 3   EKG: December 12, 2019: Atrial fibrillation with a heart rate of 81.  He has no ST or T wave changes.  Recent  Labs: No results found for requested labs within last 8760 hours.    Lipid Panel    Component Value Date/Time   CHOL 139 05/04/2017 1433   TRIG 112 05/04/2017 1433   HDL 50 05/04/2017 1433   CHOLHDL 2.8 05/04/2017 1433   CHOLHDL 2.4 02/07/2016 1152   VLDL 25 02/07/2016 1152   LDLCALC 67 05/04/2017 1433      Wt Readings from Last 3 Encounters:  12/12/19 231 lb 12.8  oz (105.1 kg)  12/12/18 243 lb 12.8 oz (110.6 kg)  12/06/17 225 lb 6.4 oz (102.2 kg)      Other studies Reviewed: Additional studies/ records that were reviewed today include: . Review of the above records demonstrates:    ASSESSMENT AND PLAN:  1. Atrial fib - he is in chronic atrial fibrillation.  CHADS2VASC is 2 ( HTN, diastolic CHF )   Cont eliquis , HR is well controlled. .   Check bmp, CBC today     2. Chronic diastolic CHF -   .stable,  Cont last   3. Essential HTN -       4. Intracranial hemorrhage   - stable   5.  Hyperlipidemia:  Check lipids, liver enz today      Current medicines are reviewed at length with the patient today.  The patient does not have concerns regarding medicines.  The following changes have been made:  no change  Labs/ tests ordered today include:  No orders of the defined types were placed in this encounter.   Disposition:  Will see him in 1 year.      Mertie Moores, MD  12/12/2019 3:28 PM    [facility] [place], [place], [place]  [address] Phone: [phone]; Fax: [phone]

## 2019-12-13 LAB — CBC
Hematocrit: 44.7 % (ref 37.5–51.0)
Hemoglobin: 15 g/dL (ref 13.0–17.7)
MCH: 29.3 pg (ref 26.6–33.0)
MCHC: 33.6 g/dL (ref 31.5–35.7)
MCV: 87 fL (ref 79–97)
Platelets: 302 10*3/uL (ref 150–450)
RBC: 5.12 x10E6/uL (ref 4.14–5.80)
RDW: 13.7 % (ref 11.6–15.4)
WBC: 6.6 10*3/uL (ref 3.4–10.8)

## 2019-12-13 LAB — HEPATIC FUNCTION PANEL
ALT: 14 IU/L (ref 0–44)
AST: 16 IU/L (ref 0–40)
Albumin: 4.7 g/dL (ref 3.8–4.8)
Alkaline Phosphatase: 69 IU/L (ref 44–121)
Bilirubin Total: 0.2 mg/dL (ref 0.0–1.2)
Bilirubin, Direct: <0.1 mg/dL (ref 0.00–0.40)
Total Protein: 7.9 g/dL (ref 6.0–8.5)

## 2019-12-13 LAB — BASIC METABOLIC PANEL
BUN/Creatinine Ratio: 19 (ref 10–24)
BUN: 31 mg/dL — ABNORMAL HIGH (ref 8–27)
CO2: 23 mmol/L (ref 20–29)
Calcium: 10.1 mg/dL (ref 8.6–10.2)
Chloride: 96 mmol/L (ref 96–106)
Creatinine, Ser: 1.63 mg/dL — ABNORMAL HIGH (ref 0.76–1.27)
GFR calc Af Amer: 50 mL/min/{1.73_m2} — ABNORMAL LOW (ref >59)
GFR calc non Af Amer: 44 mL/min/{1.73_m2} — ABNORMAL LOW (ref >59)
Glucose: 145 mg/dL — ABNORMAL HIGH (ref 65–99)
Potassium: 5.1 mmol/L (ref 3.5–5.2)
Sodium: 135 mmol/L (ref 134–144)

## 2019-12-13 LAB — LIPID PANEL
Chol/HDL Ratio: 3.4 ratio (ref 0.0–5.0)
Cholesterol, Total: 181 mg/dL (ref 100–199)
HDL: 53 mg/dL (ref >39)
LDL Chol Calc (NIH): 97 mg/dL (ref 0–99)
Triglycerides: 179 mg/dL — ABNORMAL HIGH (ref 0–149)
VLDL Cholesterol Cal: 31 mg/dL (ref 5–40)

## 2019-12-13 NOTE — Addendum Note (Signed)
Addended by: Georgiann Cocker on: 12/13/2019 04:44 PM   Modules accepted: Orders

## 2019-12-13 NOTE — Addendum Note (Signed)
Addended by: Georgiann Cocker on: 12/13/2019 04:54 PM   Modules accepted: Orders

## 2020-04-05 ENCOUNTER — Other Ambulatory Visit: Payer: Self-pay | Admitting: Cardiovascular Disease

## 2020-04-05 DIAGNOSIS — I482 Chronic atrial fibrillation, unspecified: Secondary | ICD-10-CM

## 2020-04-05 NOTE — Telephone Encounter (Signed)
Pt last saw Dr Acie Fredrickson 12/12/19, last labs 12/12/19 Creat 1.63, age 65, weight 105.1kg, based on specified criteria pt is on appropriate dosage of Eliquis 5mg  BID.  Will refill rx.

## 2020-04-11 ENCOUNTER — Other Ambulatory Visit: Payer: Self-pay | Admitting: General Practice

## 2020-04-11 DIAGNOSIS — F17211 Nicotine dependence, cigarettes, in remission: Secondary | ICD-10-CM

## 2020-04-23 ENCOUNTER — Ambulatory Visit: Payer: Medicare Other

## 2020-05-02 ENCOUNTER — Other Ambulatory Visit: Payer: Self-pay | Admitting: Cardiovascular Disease

## 2020-05-02 DIAGNOSIS — I482 Chronic atrial fibrillation, unspecified: Secondary | ICD-10-CM

## 2020-05-03 ENCOUNTER — Ambulatory Visit: Payer: Medicare Other

## 2020-05-07 ENCOUNTER — Ambulatory Visit
Admission: RE | Admit: 2020-05-07 | Discharge: 2020-05-07 | Disposition: A | Discharge status: Home / Self Care | Payer: Medicare Other | Source: Ambulatory Visit | Attending: General Practice | Admitting: General Practice

## 2020-05-07 DIAGNOSIS — F17211 Nicotine dependence, cigarettes, in remission: Secondary | ICD-10-CM

## 2020-07-01 ENCOUNTER — Other Ambulatory Visit: Payer: Self-pay

## 2020-07-01 ENCOUNTER — Ambulatory Visit: Payer: Medicare Other | Attending: Family Medicine

## 2020-07-01 DIAGNOSIS — M6281 Muscle weakness (generalized): Secondary | ICD-10-CM | POA: Diagnosis present

## 2020-07-01 DIAGNOSIS — R278 Other lack of coordination: Secondary | ICD-10-CM

## 2020-07-01 DIAGNOSIS — R2689 Other abnormalities of gait and mobility: Secondary | ICD-10-CM

## 2020-07-01 DIAGNOSIS — R262 Difficulty in walking, not elsewhere classified: Secondary | ICD-10-CM

## 2020-07-01 DIAGNOSIS — R2681 Unsteadiness on feet: Secondary | ICD-10-CM

## 2020-07-01 NOTE — Therapy (Signed)
[facility] [address] [place], [place], [address] Phone: [phone]   Fax:  [phone]  Physical Therapy Evaluation  Patient Details  Name: [name] MRN: [id number] Date of Birth: [birth year] Referring Provider (PT): Rochel Brome, MD   Encounter Date: 07/01/2020   PT End of Session - 07/01/20 1446     Visit Number 1    Number of Visits 17    Date for PT Re-Evaluation 08/30/20    Authorization Type [employer] Medicare (10th Visit PN)    Progress Note Due on Visit 10    PT Start Time 1401    PT Stop Time 1445    PT Time Calculation (min) 44 min    Equipment Utilized During Treatment Gait belt    Activity Tolerance Patient tolerated treatment well    Behavior During Therapy [facility] for tasks assessed/performed             Past Medical History:  Diagnosis Date   A-fib ([place]) 11/19/2010   Acute exacerbation of congestive heart failure ([place]) 11/19/2010   Arthritis    CAD (coronary artery disease) 11/20/2010   CHF (congestive heart failure) ([place])    CKD (chronic kidney disease) stage 3, GFR 30-59 ml/min ([place]) 05/04/2017   Coronary artery disease    Diabetes mellitus    Diabetes mellitus 11/19/2010   Diabetes type 2, controlled ([place]) 11/19/2010   GERD (gastroesophageal reflux disease) 02/08/2015   Gout    Gout 11/20/2010   History of cerebrovascular accident (CVA) with residual deficit 05/04/2017   HTN (hypertension) 07/01/2012   Hyperlipemia 11/21/2010   Hypertension    Hypertensive emergency 11/19/2010   ICH (intracerebral hemorrhage) ([place]) 12/22/2010   Physical deconditioning 12/22/2010   Pulmonary edema 11/19/2010   Respiratory failure ([place]) 11/19/2010   Shortness of breath    Stroke ([place]) 11/19/2010   Thyroiditis 11/20/2010    Past Surgical History:  Procedure Laterality Date   PEG PLACEMENT  12/03/2010   Procedure: PERCUTANEOUS ENDOSCOPIC GASTROSTOMY (PEG) PLACEMENT;  Surgeon: Lafayette Dragon, MD;  Location: [facility]  ENDOSCOPY;  Service: Endoscopy;  Laterality: N/A;   TRACHEOSTOMY TUBE PLACEMENT  11/28/2010   Procedure: TRACHEOSTOMY;  Surgeon: Beckie Salts, MD;  Location: [facility];  Service: ENT;  Laterality: N/A;    There were no vitals filed for this visit.    Subjective Assessment - 07/01/20 1405     Subjective Patient reports that he had a CVA in 2013. Patient reports since this mobility has been limited. Patient ambulating into session with RW. Mainly uses a RW and a wheelchair at times for mobility. Uses the wheelchair for long distance in the community. Denies falls or pain. Reports main interest is improving overall mobility. Also interested in Occupational Therapy.    Pertinent History CHF, A-Fib, Arthritis, CAD, SKD, DM Type 2, GERD, CVA, HTN, HLD, ICH    Limitations Standing;Walking;House hold activities    How long can you walk comfortably? 5 minutes    Patient Stated Goals Improve Mobility    Currently in Pain? No/denies                [facility] PT Assessment - 07/01/20 0001       Assessment   Medical Diagnosis Chronic CVA    Referring Provider (PT) Rochel Brome, MD    Onset Date/Surgical Date 06/03/20   referral date; CVA in 2013   Hand Dominance Right    Prior Therapy Inpatient Rehab after CVA  Precautions   Precautions Fall      Balance Screen   Has the patient fallen in the past 6 months No    Has the patient had a decrease in activity level because of a fear of falling?  No    Is the patient reluctant to leave their home because of a fear of falling?  No      Home Social worker Private residence    Living Arrangements Alone    Available Help at Discharge Friend(s)    Type of [place] Access Level entry    [place] One level    [place] - 2 wheels;Wheelchair - manual;Cane - single point;Grab bars - tub/shower;Shower seat;Grab bars - toilet    Additional Comments patient reports has someone that comes in and cleans, but  does the cooking and self care by himself      Prior Function   Level of Independence Independent with household mobility with device;Independent with community mobility with device    Vocation Retired      Charity fundraiser Status Within Functional Limits for tasks assessed      Sensation   Light Touch Impaired by gross assessment    Additional Comments patient reports dimished sensation on LLE      Coordination   Gross Motor Movements are Fluid and Coordinated No    Coordination and Movement Description uncoordinated BLE (RLE > LLE). Also noticed increased coordination deficits in RUE    Heel Shin Test grossly uncoordinated      Posture/Postural Control   Posture/Postural Control Postural limitations    Postural Limitations Rounded Shoulders;Forward head      ROM / Strength   AROM / PROM / Strength Strength      Strength   Overall Strength Deficits    Strength Assessment Site Hip;Knee;Ankle    Right/Left Hip Right;Left    Right Hip Flexion 3+/5    Right Hip ABduction 3-/5    Left Hip Flexion 4/5    Left Hip ABduction 3-/5    Right/Left Knee Right;Left    Right Knee Flexion 4/5    Right Knee Extension 4-/5    Left Knee Flexion 4/5    Left Knee Extension 4/5    Right/Left Ankle Left;Right    Right Ankle Dorsiflexion 3+/5    Left Ankle Dorsiflexion 4/5      Bed Mobility   Bed Mobility Rolling Right;Rolling Left;Supine to Sit;Sit to Supine    Rolling Right Independent    Rolling Left Independent    Supine to Sit Independent    Sit to Supine Independent      Transfers   Transfers Sit to Stand;Stand to Sit    Sit to Stand 5: Supervision;4: Min guard    Five time sit to stand comments  19.82 secs with UE support    Stand to Sit 5: Supervision;4: Min guard    Comments increased balance challenge upon standing, CGA      Ambulation/Gait   Ambulation/Gait Yes    Ambulation/Gait Assistance 5: Supervision;4: Min guard    Ambulation/Gait Assistance Details  patient ambulating with RW. PT provided tennis balls for improved propulsion. Wide BOS, small step length bilat, and decreased hip/knee flexion bilat noted with gait. Cues required to assist patient to line up with surface prior to descent to promote safety.    Ambulation Distance (Feet) 75 Feet    Assistive device Rolling walker  Gait Pattern Step-to pattern;Decreased arm swing - right;Decreased arm swing - left;Decreased step length - right;Decreased step length - left;Decreased hip/knee flexion - right;Decreased hip/knee flexion - left;Wide base of support;Poor foot clearance - right;Poor foot clearance - left    Ambulation Surface Level;Indoor      Standardized Balance Assessment   Standardized Balance Assessment Timed Up and Go Test;Berg Balance Test      Berg Balance Test   Sit to Stand Able to stand using hands after several tries    Standing Unsupported Able to stand 2 minutes with supervision    Sitting with Back Unsupported but Feet Supported on Floor or Stool Able to sit safely and securely 2 minutes    Stand to Sit Uses backs of legs against chair to control descent    Transfers Able to transfer safely, definite need of hands    Standing Unsupported with Eyes Closed Able to stand 10 seconds safely    Standing Unsupported with Feet Together Needs help to attain position and unable to hold for 15 seconds    From Standing, Reach Forward with Outstretched Arm Can reach forward >5 cm safely (2")    From Standing Position, Pick up Object from Floor Able to pick up shoe, needs supervision    From Standing Position, Turn to Look Behind Over each Shoulder Needs supervision when turning    Turn 360 Degrees Needs assistance while turning    Standing Unsupported, Alternately Place Feet on Step/Stool Needs assistance to keep from falling or unable to try    Standing Unsupported, One Foot in Front Needs help to step but can hold 15 seconds    Standing on One Leg Unable to try or needs assist  to prevent fall    Total Score 25    Berg comment: 25/56 = High Fall Risk      Timed Up and Go Test   TUG Normal TUG    Normal TUG (seconds) 45.81   with RW                Objective measurements completed on examination: See above findings.               PT Education - 07/01/20 1445     Education Details POC/Evaluation Findings; Fall Risk and Importance of use of AD to promote safety    Person(s) Educated Patient    Methods Explanation    Comprehension Verbalized understanding              PT Short Term Goals - 07/01/20 1752       PT SHORT TERM GOAL #1   Title Patient will be independent with initial HEP focused on balance/strength (All STGs Due: 08/02/20)    Baseline no HEP established    Time 4    Period Weeks    Status New    Target Date 08/02/20      PT SHORT TERM GOAL #2   Title Patient will improve TUG to </= 35 seconds to demonstrate improved balance and functional mobility    Baseline 45.81 secs    Time 4    Period Weeks    Status New      PT SHORT TERM GOAL #3   Title Patient will improve Berg Balance to >/= 30/56 to demonstrate improved balance    Baseline 25/56    Time 4    Period Weeks    Status New      PT SHORT TERM GOAL #4  Title Gait Speed to be assessed and STG/LTG to be updated as appropriate    Baseline TBA    Time 4    Period Weeks    Status New      PT SHORT TERM GOAL #5   Title Patient will verbalize understanding of fall prevention to promote safety within the home/community    Baseline dependent    Time 4    Period Weeks    Status New               PT Long Term Goals - 07/01/20 1755       PT LONG TERM GOAL #1   Title Patient will be independent with final HEP focused on balance/strength (All LTGs Due: 08/30/20)    Baseline no HEP established    Time 8    Period Weeks    Status New    Target Date 08/30/20      PT LONG TERM GOAL #2   Title Patient will improve 5x sit <> stand to </= 15 seconds  with UE support to demo improved balance    Baseline 19.82 secs    Time 8    Period Weeks    Status New      PT LONG TERM GOAL #3   Title Patient will improve TUG to </= 25 seconds with LRAD to demo improved balance    Baseline 45.81 secs    Time 8    Period Weeks    Status New      PT LONG TERM GOAL #4   Title Patient will improve Berg Balance to >/= 40/56 to demonstrate improved balance and reduced fall risk    Baseline 25/56    Time 8    Period Weeks    Status New      PT LONG TERM GOAL #5   Title LTG to be set for Gait Speed    Baseline TBA    Time 8    Period Weeks    Status New      Additional Long Term Goals   Additional Long Term Goals Yes      PT LONG TERM GOAL #6   Title Patient will be able to ambulate >/= 400 ft with LRAD and Mod I to demonstrate improved household mobility/community mobility    Baseline 75    Time 8    Period Weeks    Status New                    Plan - 07/01/20 1745     Clinical Impression Statement Patient is a 65 y.o. male referred to Neuro OPPT for Chronic CVA affecting R side. Patient's PMH significant for the following: CHF, A-Fib, Arthritis, CAD, SKD, DM Type 2, GERD, CVA, HTN, HLD, ICH. Patient is currently using a RW for household mobility and short distance mobility, and use of w/c for long distance/community mobility. Upon evaluation patient presents with the following impairments: decreased strength, impaired sensation, abnormal tone, abnormal posture, impaired coordination, decreased balance, abnormal gait, decreased activity tolerance, and increased fall risk. Patient scored 25/56 on Berg Balance and TUG time of 45.81 secs indicating high risk for falls at this time. Patient will benefit from skilled PT services to address impairments and improved functional mobility.    Personal Factors and Comorbidities Comorbidity 3+;Transportation;Time since onset of injury/illness/exacerbation    Comorbidities CHF, A-Fib,  Arthritis, CAD, SKD, DM Type 2, GERD, CVA, HTN, HLD, ICH    Examination-Activity Limitations  Bend;Stairs;Stand;Locomotion Level;Transfers    Examination-Participation Restrictions Driving;Community Activity;Cleaning    Stability/Clinical Decision Making Stable/Uncomplicated    Clinical Decision Making Low    Rehab Potential Good    PT Frequency 2x / week    PT Duration 8 weeks    PT Treatment/Interventions ADLs/Self Care Home Management;Aquatic Therapy;Cryotherapy;Electrical Stimulation;Moist Heat;DME Instruction;Gait training;Stair training;Functional mobility training;Therapeutic activities;Therapeutic exercise;Balance training;Neuromuscular re-education;Patient/family education;Orthotic Fit/Training;Manual techniques;Passive range of motion;Dry needling;Joint Manipulations    PT Next Visit Plan Assess Gait Speed. Initiate HEP focused on supine/seated strengthening and balance    Recommended Other Services Occupational Therapy (PT to request order)    Consulted and Agree with Plan of Care Patient             Patient will benefit from skilled therapeutic intervention in order to improve the following deficits and impairments:  Abnormal gait, Decreased balance, Decreased mobility, Difficulty walking, Postural dysfunction, Decreased strength, Decreased coordination, Decreased activity tolerance, Decreased knowledge of use of DME, Impaired tone, Impaired sensation  Visit Diagnosis: Other abnormalities of gait and mobility  Difficulty in walking, not elsewhere classified  Muscle weakness (generalized)  Other lack of coordination  Unsteadiness on feet     Problem List Patient Active Problem List   Diagnosis Date Noted   Chronic diastolic CHF (congestive heart failure) ([place]) 10/28/2017   History of cerebrovascular accident (CVA) with residual deficit 05/04/2017   CKD (chronic kidney disease) stage 3, GFR 30-59 ml/min ([facility]) 05/04/2017   GERD (gastroesophageal reflux disease)  02/08/2015   HTN (hypertension) 07/01/2012   ICH (intracerebral hemorrhage) ([place]) 12/22/2010   Hyperlipemia 11/21/2010   Gout 11/20/2010   CAD (coronary artery disease) 11/20/2010   Stroke ([place]) 11/19/2010   Diabetes type 2, controlled ([place]) 11/19/2010   Permanent atrial fibrillation 11/19/2010    Jones Bales, PT, DPT 07/01/2020, 6:00 PM  [facility] [address] [place], [place], [address] Phone: [phone]   Fax:  [phone]  Name: [name] MRN: [id number] Date of Birth: Sep 25, 1955

## 2020-07-08 ENCOUNTER — Telehealth: Payer: Self-pay | Admitting: General Practice

## 2020-07-08 NOTE — Telephone Encounter (Signed)
   OLIN GURSKI DOB: 01/23/1955 MRN: [id number]   RIDER WAIVER AND RELEASE OF LIABILITY  For purposes of improving physical access to our facilities, [facility] is pleased to partner with third parties to provide [facility] patients or other authorized individuals the option of convenient, on-demand ground transportation services (the Technical brewer") through use of the technology service that enables users to request on-demand ground transportation from independent third-party providers.  By opting to use and accept these [employer], I, the undersigned, hereby agree on behalf of myself, and on behalf of any minor child using the Government social research officer for whom I am the parent or legal guardian, as follows:  Government social research officer provided to me are provided by independent third-party transportation providers who are not [employer] or employees and who are unaffiliated with [employer]. [facility] is neither a transportation carrier nor a common or public carrier. [facility] [place] [facility] has no control over the quality or safety of the transportation that occurs as a result of the [employer]. [facility] cannot guarantee that any third-party transportation provider will complete any arranged transportation service. [facility] makes no representation, warranty, or guarantee regarding the reliability, timeliness, quality, safety, suitability, or availability of any of the Transport Services or that they will be error free. I fully understand that traveling by vehicle involves risks and dangers of serious bodily injury, including permanent disability, paralysis, and death. I agree, on behalf of myself and on behalf of any minor child using the Transport Services for whom I am the parent or legal guardian, that the entire risk arising out of my use of the [employer] remains solely with me, to the maximum extent permitted under applicable law. The [employer] are provided "as  is" and "as available." [facility] disclaims all representations and warranties, express, implied or statutory, not expressly set out in these terms, including the implied warranties of merchantability and fitness for a particular purpose. I hereby waive and release [facility], its agents, employees, officers, directors, representatives, insurers, attorneys, assigns, successors, subsidiaries, and affiliates from any and all past, present, or future claims, demands, liabilities, actions, causes of action, or suits of any kind directly or indirectly arising from acceptance and use of the [employer]. I further waive and release  and its affiliates from all present and future liability and responsibility for any injury or death to persons or damages to property caused by or related to the use of the [employer]. I have read this Waiver and Release of Liability, and I understand the terms used in it and their legal significance. This Waiver is freely and voluntarily given with the understanding that my right (as well as the right of any minor child for whom I am the parent or legal guardian using the [employer]) to legal recourse against  in connection with the [employer] is knowingly surrendered in return for use of these services.   I attest that I read the consent document to Mckinley Jewel, gave Mr. Dewalt the opportunity to ask questions and answered the questions asked (if any). I affirm that Mckinley Jewel then provided consent for he's participation in this program.     Legrand Pitts

## 2020-07-09 ENCOUNTER — Other Ambulatory Visit: Payer: Self-pay

## 2020-07-09 ENCOUNTER — Ambulatory Visit: Payer: Medicare Other

## 2020-07-09 DIAGNOSIS — R2689 Other abnormalities of gait and mobility: Secondary | ICD-10-CM

## 2020-07-09 DIAGNOSIS — M6281 Muscle weakness (generalized): Secondary | ICD-10-CM

## 2020-07-09 DIAGNOSIS — R262 Difficulty in walking, not elsewhere classified: Secondary | ICD-10-CM

## 2020-07-09 NOTE — Patient Instructions (Signed)
Access Code: [email or web address] URL: [email or web address] Date: 07/09/2020 Prepared by: Sharlynn Oliphant  Exercises Supine Bridge - 2 x daily - 7 x weekly - 2 sets - 10 reps Clamshell - 2 x daily - 7 x weekly - 2 sets - 10 reps Supine March - 2 x daily - 7 x weekly - 2 sets - 10 reps Supine Lower Trunk Rotation - 2 x daily - 7 x weekly - 2 sets - 10 reps

## 2020-07-09 NOTE — Therapy (Signed)
[facility] [address] [place], [place], [address] Phone: [phone]   Fax:  [phone]  Physical Therapy Treatment  Patient Details  Name: [name] MRN: [id number] Date of Birth: September 02, 1955 Referring Provider (PT): Rochel Brome, MD   Encounter Date: 07/09/2020   PT End of Session - 07/09/20 1616     Visit Number 2    Number of Visits 17    Date for PT Re-Evaluation 08/30/20    Authorization Type UHC Medicare (10th Visit PN)    Progress Note Due on Visit 10    PT Start Time 1530    PT Stop Time 1615    PT Time Calculation (min) 45 min    Equipment Utilized During Treatment Gait belt    Activity Tolerance Patient tolerated treatment well    Behavior During Therapy [facility] for tasks assessed/performed             Past Medical History:  Diagnosis Date   A-fib ([place]) 11/19/2010   Acute exacerbation of congestive heart failure ([place]) 11/19/2010   Arthritis    CAD (coronary artery disease) 11/20/2010   CHF (congestive heart failure) ([place])    CKD (chronic kidney disease) stage 3, GFR 30-59 ml/min ([place]) 05/04/2017   Coronary artery disease    Diabetes mellitus    Diabetes mellitus 11/19/2010   Diabetes type 2, controlled ([place]) 11/19/2010   GERD (gastroesophageal reflux disease) 02/08/2015   Gout    Gout 11/20/2010   History of cerebrovascular accident (CVA) with residual deficit 05/04/2017   HTN (hypertension) 07/01/2012   Hyperlipemia 11/21/2010   Hypertension    Hypertensive emergency 11/19/2010   ICH (intracerebral hemorrhage) ([place]) 12/22/2010   Physical deconditioning 12/22/2010   Pulmonary edema 11/19/2010   Respiratory failure ([place]) 11/19/2010   Shortness of breath    Stroke ([place]) 11/19/2010   Thyroiditis 11/20/2010    Past Surgical History:  Procedure Laterality Date   PEG PLACEMENT  12/03/2010   Procedure: PERCUTANEOUS ENDOSCOPIC GASTROSTOMY (PEG) PLACEMENT;  Surgeon: Lafayette Dragon, MD;  [facility]  ENDOSCOPY;  Service: Endoscopy;  Laterality: N/A;   TRACHEOSTOMY TUBE PLACEMENT  11/28/2010   Procedure: TRACHEOSTOMY;  Surgeon: Beckie Salts, MD;  Location: [facility];  Service: ENT;  Laterality: N/A;    There were no vitals filed for this visit.                      [facility] Adult PT Treatment/Exercise - 07/09/20 0001       Transfers   Transfers Sit to Stand    Sit to Stand 4: Min guard    Stand to Sit 5: Supervision    Comments cued to WS forward and not brace knees against table, performed 5x then 5x with OH reach      Ambulation/Gait   Ambulation/Gait Yes    Ambulation/Gait Assistance 5: Supervision;4: Min guard    Ambulation Distance (Feet) 150 Feet    Assistive device Rolling walker    Gait Pattern Step-to pattern;Decreased arm swing - right;Decreased arm swing - left;Decreased step length - right;Decreased step length - left;Decreased hip/knee flexion - right;Decreased hip/knee flexion - left;Wide base of support;Poor foot clearance - right;Poor foot clearance - left    Ambulation Surface Level;Indoor    Gait velocity 0.18 m/s      Lumbar Exercises: Seated   Other Seated Lumbar Exercises --      Lumbar Exercises: Supine   Bridge 10 reps;Limitations  Bridge Limitations 2x10    Other Supine Lumbar Exercises LTR, 2x10      Lumbar Exercises: Sidelying   Clam 10 reps;Both    Clam Limitations 2x10      Knee/Hip Exercises: Seated   Long Arc Quad Strengthening;Both;2 sets;15 reps;Limitations    Long Arc Quad Limitations ball squeeze    Marching Strengthening;2 sets;15 reps;Limitations    Marching Limitations performed with pressdown, alternating pattern, 2x15 followed by LAQs, same manner      Knee/Hip Exercises: Supine   Other Supine Knee/Hip Exercises marching, alt. 2x10                      PT Short Term Goals - 07/09/20 1615       PT SHORT TERM GOAL #1   Title Patient will be independent with initial HEP focused on balance/strength  (All STGs Due: 08/02/20)    Baseline no HEP established    Time 4    Period Weeks    Status New    Target Date 08/02/20      PT SHORT TERM GOAL #2   Title Patient will improve TUG to </= 35 seconds to demonstrate improved balance and functional mobility    Baseline 45.81 secs    Time 4    Period Weeks    Status New      PT SHORT TERM GOAL #3   Title Patient will improve Berg Balance to >/= 30/56 to demonstrate improved balance    Baseline 25/56    Time 4    Period Weeks    Status New      PT SHORT TERM GOAL #4   Title Gait Speed to be assessed and STG/LTG to be updated as appropriate; 07/09/20 Goal is 0.25 m/s    Baseline TBA; 07/09/20 gait speed 0.18 m/s    Time 4    Period Weeks    Status New      PT SHORT TERM GOAL #5   Title Patient will verbalize understanding of fall prevention to promote safety within the home/community    Baseline dependent    Time 4    Period Weeks    Status New               PT Long Term Goals - 07/01/20 1755       PT LONG TERM GOAL #1   Title Patient will be independent with final HEP focused on balance/strength (All LTGs Due: 08/30/20)    Baseline no HEP established    Time 8    Period Weeks    Status New    Target Date 08/30/20      PT LONG TERM GOAL #2   Title Patient will improve 5x sit <> stand to </= 15 seconds with UE support to demo improved balance    Baseline 19.82 secs    Time 8    Period Weeks    Status New      PT LONG TERM GOAL #3   Title Patient will improve TUG to </= 25 seconds with LRAD to demo improved balance    Baseline 45.81 secs    Time 8    Period Weeks    Status New      PT LONG TERM GOAL #4   Title Patient will improve Berg Balance to >/= 40/56 to demonstrate improved balance and reduced fall risk    Baseline 25/56    Time 8    Period Weeks  Status New      PT LONG TERM GOAL #5   Title LTG to be set for Gait Speed    Baseline TBA    Time 8    Period Weeks    Status New      Additional  Long Term Goals   Additional Long Term Goals Yes      PT LONG TERM GOAL #6   Title Patient will be able to ambulate >/= 400 ft with LRAD and Mod I to demonstrate improved household mobility/community mobility    Baseline 75    Time 8    Period Weeks    Status New                   Plan - 07/09/20 1617     Clinical Impression Statement Todays session focused on establishing a HEP, seting gait velocity goal, and transfer training to [facility] safety.  Patient instructed in strategies to make transfer more efficient and safer    Personal Factors and Comorbidities Comorbidity 3+;Transportation;Time since onset of injury/illness/exacerbation    Comorbidities CHF, A-Fib, Arthritis, CAD, SKD, DM Type 2, GERD, CVA, HTN, HLD, ICH    Examination-Activity Limitations Bend;Stairs;Stand;Locomotion Level;Transfers    Examination-Participation Restrictions Driving;Community Activity;Cleaning    Stability/Clinical Decision Making Stable/Uncomplicated    Rehab Potential Good    PT Frequency 2x / week    PT Duration 8 weeks    PT Treatment/Interventions ADLs/Self Care Home Management;Aquatic Therapy;Cryotherapy;Electrical Stimulation;Moist Heat;DME Instruction;Gait training;Stair training;Functional mobility training;Therapeutic activities;Therapeutic exercise;Balance training;Neuromuscular re-education;Patient/family education;Orthotic Fit/Training;Manual techniques;Passive range of motion;Dry needling;Joint Manipulations    PT Next Visit Plan review and aupdate HEP, establish balance training tasks    Consulted and Agree with Plan of Care Patient             Patient will benefit from skilled therapeutic intervention in order to improve the following deficits and impairments:  Abnormal gait, Decreased balance, Decreased mobility, Difficulty walking, Postural dysfunction, Decreased strength, Decreased coordination, Decreased activity tolerance, Decreased knowledge of use of DME, Impaired tone,  Impaired sensation  Visit Diagnosis: Other abnormalities of gait and mobility  Difficulty in walking, not elsewhere classified  Muscle weakness (generalized)     Problem List Patient Active Problem List   Diagnosis Date Noted   Chronic diastolic CHF (congestive heart failure) ([place]) 10/28/2017   History of cerebrovascular accident (CVA) with residual deficit 05/04/2017   CKD (chronic kidney disease) stage 3, GFR 30-59 ml/min ([facility]) 05/04/2017   GERD (gastroesophageal reflux disease) 02/08/2015   HTN (hypertension) 07/01/2012   ICH (intracerebral hemorrhage) ([place]) 12/22/2010   Hyperlipemia 11/21/2010   Gout 11/20/2010   CAD (coronary artery disease) 11/20/2010   Stroke ([place]) 11/19/2010   Diabetes type 2, controlled ([place]) 11/19/2010   Permanent atrial fibrillation 11/19/2010    Lanice Shirts 07/09/2020, 5:03 PM  [facility] [address] [place], [place], [address] Phone: [phone]   Fax:  [phone]  Name: [name] MRN: [id number] Date of Birth: March 21, 1955

## 2020-07-11 ENCOUNTER — Ambulatory Visit: Payer: Medicare Other

## 2020-07-11 ENCOUNTER — Other Ambulatory Visit: Payer: Self-pay

## 2020-07-11 DIAGNOSIS — R262 Difficulty in walking, not elsewhere classified: Secondary | ICD-10-CM

## 2020-07-11 DIAGNOSIS — R2689 Other abnormalities of gait and mobility: Secondary | ICD-10-CM

## 2020-07-11 DIAGNOSIS — R2681 Unsteadiness on feet: Secondary | ICD-10-CM

## 2020-07-11 DIAGNOSIS — M6281 Muscle weakness (generalized): Secondary | ICD-10-CM

## 2020-07-11 NOTE — Therapy (Signed)
[facility] [address] [place], [place], [address] Phone: [phone]   Fax:  [phone]  Physical Therapy Treatment  Patient Details  Name: [name] MRN: [id number] Date of Birth: 1955-11-27 Referring Provider (PT): Rochel Brome, MD   Encounter Date: 07/11/2020   PT End of Session - 07/11/20 1450     Visit Number 3    Number of Visits 17    Date for PT Re-Evaluation 08/30/20    Authorization Type UHC Medicare (10th Visit PN)    Progress Note Due on Visit 10    PT Start Time 1446    PT Stop Time 1527    PT Time Calculation (min) 41 min    Equipment Utilized During Treatment Gait belt    Activity Tolerance Patient tolerated treatment well    Behavior During Therapy [facility] for tasks assessed/performed             Past Medical History:  Diagnosis Date   A-fib ([place]) 11/19/2010   Acute exacerbation of congestive heart failure ([place]) 11/19/2010   Arthritis    CAD (coronary artery disease) 11/20/2010   CHF (congestive heart failure) ([place])    CKD (chronic kidney disease) stage 3, GFR 30-59 ml/min ([place]) 05/04/2017   Coronary artery disease    Diabetes mellitus    Diabetes mellitus 11/19/2010   Diabetes type 2, controlled ([place]) 11/19/2010   GERD (gastroesophageal reflux disease) 02/08/2015   Gout    Gout 11/20/2010   History of cerebrovascular accident (CVA) with residual deficit 05/04/2017   HTN (hypertension) 07/01/2012   Hyperlipemia 11/21/2010   Hypertension    Hypertensive emergency 11/19/2010   ICH (intracerebral hemorrhage) ([place]) 12/22/2010   Physical deconditioning 12/22/2010   Pulmonary edema 11/19/2010   Respiratory failure ([place]) 11/19/2010   Shortness of breath    Stroke ([place]) 11/19/2010   Thyroiditis 11/20/2010    Past Surgical History:  Procedure Laterality Date   PEG PLACEMENT  12/03/2010   Procedure: PERCUTANEOUS ENDOSCOPIC GASTROSTOMY (PEG) PLACEMENT;  Surgeon: Lafayette Dragon, MD;  Location: [facility]  ENDOSCOPY;  Service: Endoscopy;  Laterality: N/A;   TRACHEOSTOMY TUBE PLACEMENT  11/28/2010   Procedure: TRACHEOSTOMY;  Surgeon: Beckie Salts, MD;  Location: [facility];  Service: ENT;  Laterality: N/A;    There were no vitals filed for this visit.   Subjective Assessment - 07/11/20 1450     Subjective Patient denies any changes since last visit. No falls. Denies pain.    Pertinent History CHF, A-Fib, Arthritis, CAD, SKD, DM Type 2, GERD, CVA, HTN, HLD, ICH    Limitations Standing;Walking;House hold activities    How long can you walk comfortably? 5 minutes    Patient Stated Goals Improve Mobility    Currently in Pain? No/denies                               [facility] Adult PT Treatment/Exercise - 07/11/20 0001       Transfers   Transfers Sit to Stand;Stand to Sit    Sit to Stand 4: Min guard    Stand to Sit 4: Min guard    Comments completed sit <> stand training, with focus on improved forward lean, reduced bracing of LE against surface and use of BUE on mat/surface vs placing on RW. Completed 2 x 5 reps, with improved tehcnique noted. PT educating on to conitnue to practice this at home daily.  Ambulation/Gait   Ambulation/Gait Yes    Ambulation/Gait Assistance 5: Supervision;4: Min guard    Ambulation/Gait Assistance Details completed gait training with RW, with primary focus on improved gait pattern including Bilat hip/knee flexion as patient continue to demo maintaining extension in BLE throughout all phases of gait. plus additional distance into/out of clinic. Cues for step length, keeping RW close to body and relaxation. Patient very tense maintaining extension in BUE as well keeping AD far from body.    Ambulation Distance (Feet) 75 Feet    Assistive device Rolling walker    Gait Pattern Step-to pattern;Decreased arm swing - right;Decreased arm swing - left;Decreased step length - right;Decreased step length - left;Decreased hip/knee flexion - right;Decreased  hip/knee flexion - left;Wide base of support;Poor foot clearance - right;Poor foot clearance - left    Ambulation Surface Level;Indoor      Neuro Re-ed    Neuro Re-ed Details  Standing with BUE from chair, completed staggered stance with alternaitng foot forward and completed A/P weight shift x 15 reps each, then alternating foot and completing second set.      Exercises   Exercises Other Exercises    Other Exercises  With BUE support completed mini squats focused on improved knee flexion, completed 2 x 10 reps. cues for reduced UE support and promtoe standing up tall through BLE.                 Balance Exercises - 07/11/20 0001       Balance Exercises: Standing   Standing Eyes Opened Wide (BOA);Narrow base of support (BOS);Head turns;Solid surface;Limitations    Standing Eyes Opened Limitations standing hip width completing horiz/vertical head turns x 10 reps, progressed to more narrow BOS and completed second set. educated on proper ocmpletion with HEP    Standing Eyes Closed Wide (BOA);Solid surface;3 reps;30 secs;Limitations    Standing Eyes Closed Limitations added to HEP. cues for feet hip width               PT Education - 07/11/20 1529     Education Details updated HEP    Person(s) Educated Patient    Methods Explanation;Demonstration;Handout    Comprehension Returned demonstration;Verbalized understanding              PT Short Term Goals - 07/09/20 1615       PT SHORT TERM GOAL #1   Title Patient will be independent with initial HEP focused on balance/strength (All STGs Due: 08/02/20)    Baseline no HEP established    Time 4    Period Weeks    Status New    Target Date 08/02/20      PT SHORT TERM GOAL #2   Title Patient will improve TUG to </= 35 seconds to demonstrate improved balance and functional mobility    Baseline 45.81 secs    Time 4    Period Weeks    Status New      PT SHORT TERM GOAL #3   Title Patient will improve Berg Balance to  >/= 30/56 to demonstrate improved balance    Baseline 25/56    Time 4    Period Weeks    Status New      PT SHORT TERM GOAL #4   Title Gait Speed to be assessed and STG/LTG to be updated as appropriate; 07/09/20 Goal is 0.25 m/s    Baseline TBA; 07/09/20 gait speed 0.18 m/s    Time 4    Period Weeks  Status New      PT SHORT TERM GOAL #5   Title Patient will verbalize understanding of fall prevention to promote safety within the home/community    Baseline dependent    Time 4    Period Weeks    Status New               PT Long Term Goals - 07/01/20 1755       PT LONG TERM GOAL #1   Title Patient will be independent with final HEP focused on balance/strength (All LTGs Due: 08/30/20)    Baseline no HEP established    Time 8    Period Weeks    Status New    Target Date 08/30/20      PT LONG TERM GOAL #2   Title Patient will improve 5x sit <> stand to </= 15 seconds with UE support to demo improved balance    Baseline 19.82 secs    Time 8    Period Weeks    Status New      PT LONG TERM GOAL #3   Title Patient will improve TUG to </= 25 seconds with LRAD to demo improved balance    Baseline 45.81 secs    Time 8    Period Weeks    Status New      PT LONG TERM GOAL #4   Title Patient will improve Berg Balance to >/= 40/56 to demonstrate improved balance and reduced fall risk    Baseline 25/56    Time 8    Period Weeks    Status New      PT LONG TERM GOAL #5   Title LTG to be set for Gait Speed    Baseline TBA    Time 8    Period Weeks    Status New      Additional Long Term Goals   Additional Long Term Goals Yes      PT LONG TERM GOAL #6   Title Patient will be able to ambulate >/= 400 ft with LRAD and Mod I to demonstrate improved household mobility/community mobility    Baseline 75    Time 8    Period Weeks    Status New                   Plan - 07/11/20 1530     Clinical Impression Statement Today's skilled PT session focused on  continud gait trianing and sit <> stand training focus on improved technique, form, and promoting BLE hip/knee flexion to avoid extension pattern. Completed standing balance, with patient tolerating well and added to HEP. Will continue to progress toward all LTGs.    Personal Factors and Comorbidities Comorbidity 3+;Transportation;Time since onset of injury/illness/exacerbation    Comorbidities CHF, A-Fib, Arthritis, CAD, SKD, DM Type 2, GERD, CVA, HTN, HLD, ICH    Examination-Activity Limitations Bend;Stairs;Stand;Locomotion Level;Transfers    Examination-Participation Restrictions Driving;Community Activity;Cleaning    Stability/Clinical Decision Making Stable/Uncomplicated    Rehab Potential Good    PT Frequency 2x / week    PT Duration 8 weeks    PT Treatment/Interventions ADLs/Self Care Home Management;Aquatic Therapy;Cryotherapy;Electrical Stimulation;Moist Heat;DME Instruction;Gait training;Stair training;Functional mobility training;Therapeutic activities;Therapeutic exercise;Balance training;Neuromuscular re-education;Patient/family education;Orthotic Fit/Training;Manual techniques;Passive range of motion;Dry needling;Joint Manipulations    PT Next Visit Plan continue gait training, sit <> stand training, balance, functional mobility, BLE strengthening. activites to promote hip/knee flexion    Consulted and Agree with Plan of Care Patient  Patient will benefit from skilled therapeutic intervention in order to improve the following deficits and impairments:  Abnormal gait, Decreased balance, Decreased mobility, Difficulty walking, Postural dysfunction, Decreased strength, Decreased coordination, Decreased activity tolerance, Decreased knowledge of use of DME, Impaired tone, Impaired sensation  Visit Diagnosis: Other abnormalities of gait and mobility  Muscle weakness (generalized)  Difficulty in walking, not elsewhere classified  Unsteadiness on feet     Problem  List Patient Active Problem List   Diagnosis Date Noted   Chronic diastolic CHF (congestive heart failure) ([place]) 10/28/2017   History of cerebrovascular accident (CVA) with residual deficit 05/04/2017   CKD (chronic kidney disease) stage 3, GFR 30-59 ml/min ([place]) 05/04/2017   GERD (gastroesophageal reflux disease) 02/08/2015   HTN (hypertension) 07/01/2012   ICH (intracerebral hemorrhage) ([place]) 12/22/2010   Hyperlipemia 11/21/2010   Gout 11/20/2010   CAD (coronary artery disease) 11/20/2010   Stroke ([place]) 11/19/2010   Diabetes type 2, controlled ([place]) 11/19/2010   Permanent atrial fibrillation 11/19/2010    Jones Bales, PT, DPT 07/11/2020, 3:31 PM  [facility] [address] [place], [place], [address] Phone: [phone]   Fax:  [phone]  Name: [name] MRN: [id number] Date of Birth: 09-15-1955

## 2020-07-11 NOTE — Patient Instructions (Signed)
Access Code: [email or web address] URL: [email or web address] Date: 07/11/2020 Prepared by: Baldomero Lamy  Exercises Supine Bridge - 2 x daily - 7 x weekly - 2 sets - 10 reps Clamshell - 2 x daily - 7 x weekly - 2 sets - 10 reps Supine March - 2 x daily - 7 x weekly - 2 sets - 10 reps Supine Lower Trunk Rotation - 2 x daily - 7 x weekly - 2 sets - 10 reps Standing with Head Rotation - 1 x daily - 7 x weekly - 1 sets - 10 reps Standing with Head Nod - 1 x daily - 7 x weekly - 1 sets - 10 reps Standing Balance with Eyes Closed - 1 x daily - 7 x weekly - 1 sets - 3 reps - 30 seconds hold

## 2020-07-16 ENCOUNTER — Other Ambulatory Visit: Payer: Self-pay

## 2020-07-16 ENCOUNTER — Ambulatory Visit: Payer: Medicare Other | Attending: General Practice

## 2020-07-16 DIAGNOSIS — R2681 Unsteadiness on feet: Secondary | ICD-10-CM

## 2020-07-16 DIAGNOSIS — R262 Difficulty in walking, not elsewhere classified: Secondary | ICD-10-CM | POA: Diagnosis present

## 2020-07-16 DIAGNOSIS — M6281 Muscle weakness (generalized): Secondary | ICD-10-CM | POA: Insufficient documentation

## 2020-07-16 DIAGNOSIS — R2689 Other abnormalities of gait and mobility: Secondary | ICD-10-CM | POA: Insufficient documentation

## 2020-07-16 DIAGNOSIS — R278 Other lack of coordination: Secondary | ICD-10-CM | POA: Insufficient documentation

## 2020-07-16 NOTE — Therapy (Signed)
[facility] [address] [place], [place], [address] Phone: [phone]   Fax:  [phone]  Physical Therapy Treatment  Patient Details  Name: [name] MRN: [id number] Date of Birth: [birth year] Referring Provider (PT): Rochel Brome, MD   Encounter Date: 07/16/2020   PT End of Session - 07/16/20 1620     Visit Number 4    Number of Visits 17    Date for PT Re-Evaluation 08/30/20    Authorization Type UHC Medicare (10th Visit PN)    Progress Note Due on Visit 10    PT Start Time 1540    PT Stop Time 1620    PT Time Calculation (min) 40 min    Equipment Utilized During Treatment Gait belt    Activity Tolerance Patient tolerated treatment well    Behavior During Therapy [facility] for tasks assessed/performed             Past Medical History:  Diagnosis Date   A-fib ([place]) 11/19/2010   Acute exacerbation of congestive heart failure ([place]) 11/19/2010   Arthritis    CAD (coronary artery disease) 11/20/2010   CHF (congestive heart failure) ([place])    CKD (chronic kidney disease) stage 3, GFR 30-59 ml/min ([place]) 05/04/2017   Coronary artery disease    Diabetes mellitus    Diabetes mellitus 11/19/2010   Diabetes type 2, controlled ([place]) 11/19/2010   GERD (gastroesophageal reflux disease) 02/08/2015   Gout    Gout 11/20/2010   History of cerebrovascular accident (CVA) with residual deficit 05/04/2017   HTN (hypertension) 07/01/2012   Hyperlipemia 11/21/2010   Hypertension    Hypertensive emergency 11/19/2010   ICH (intracerebral hemorrhage) ([place]) 12/22/2010   Physical deconditioning 12/22/2010   Pulmonary edema 11/19/2010   Respiratory failure ([place]) 11/19/2010   Shortness of breath    Stroke ([place]) 11/19/2010   Thyroiditis 11/20/2010    Past Surgical History:  Procedure Laterality Date   PEG PLACEMENT  12/03/2010   Procedure: PERCUTANEOUS ENDOSCOPIC GASTROSTOMY (PEG) PLACEMENT;  Surgeon: Lafayette Dragon, MD;  Location: [facility]  ENDOSCOPY;  Service: Endoscopy;  Laterality: N/A;   TRACHEOSTOMY TUBE PLACEMENT  11/28/2010   Procedure: TRACHEOSTOMY;  Surgeon: Beckie Salts, MD;  Location: [place] [facility];  Service: ENT;  Laterality: N/A;    There were no vitals filed for this visit.   Subjective Assessment - 07/16/20 1546     Subjective Fell last night trying to watch fireworks, incurred some skin abrasions    Pertinent History CHF, A-Fib, Arthritis, CAD, SKD, DM Type 2, GERD, CVA, HTN, HLD, ICH    Limitations Standing;Walking;House hold activities    How long can you walk comfortably? 5 minutes    Patient Stated Goals Improve Mobility                               [facility] Adult PT Treatment/Exercise - 07/16/20 0001       Knee/Hip Exercises: Seated   Long Arc Quad Strengthening;Both;2 sets;15 reps;Limitations    Long Arc Quad Limitations ball squeze    Heel Slides Strengthening;Both;2 sets;15 reps    Heel Slides Limitations red band    Marching Strengthening;Both;2 sets;15 reps    Marching Limitations 1#                 Balance Exercises - 07/16/20 0001       Balance Exercises: Standing   SLS with Vectors Solid surface;Upper extremity assist  2;Limitations    SLS with Vectors Limitations in // bars, tapping floor targets, starting with 2 then 1 target, alternatiing pattern for 10 reps per LE    Stepping Strategy Anterior;Lateral;UE support;10 reps;Limitations    Stepping Strategy Limitations performed in RW    Step Ups Forward;4 inch;UE support 2;Limitations    Step Ups Limitations step taps with WS onto 4" block, 10x per LE    Marching Upper extremity assist 2;10 reps    Marching Limitations Standing marching in // bars, BUE support, alt, 10x ea. leg    Other Standing Exercises Standing marching in // bars, BUE support, alt, 10x ea. leg                 PT Short Term Goals - 07/09/20 1615       PT SHORT TERM GOAL #1   Title Patient will be independent with initial HEP  focused on balance/strength (All STGs Due: 08/02/20)    Baseline no HEP established    Time 4    Period Weeks    Status New    Target Date 08/02/20      PT SHORT TERM GOAL #2   Title Patient will improve TUG to </= 35 seconds to demonstrate improved balance and functional mobility    Baseline 45.81 secs    Time 4    Period Weeks    Status New      PT SHORT TERM GOAL #3   Title Patient will improve Berg Balance to >/= 30/56 to demonstrate improved balance    Baseline 25/56    Time 4    Period Weeks    Status New      PT SHORT TERM GOAL #4   Title Gait Speed to be assessed and STG/LTG to be updated as appropriate; 07/09/20 Goal is 0.25 m/s    Baseline TBA; 07/09/20 gait speed 0.18 m/s    Time 4    Period Weeks    Status New      PT SHORT TERM GOAL #5   Title Patient will verbalize understanding of fall prevention to promote safety within the home/community    Baseline dependent    Time 4    Period Weeks    Status New               PT Long Term Goals - 07/01/20 1755       PT LONG TERM GOAL #1   Title Patient will be independent with final HEP focused on balance/strength (All LTGs Due: 08/30/20)    Baseline no HEP established    Time 8    Period Weeks    Status New    Target Date 08/30/20      PT LONG TERM GOAL #2   Title Patient will improve 5x sit <> stand to </= 15 seconds with UE support to demo improved balance    Baseline 19.82 secs    Time 8    Period Weeks    Status New      PT LONG TERM GOAL #3   Title Patient will improve TUG to </= 25 seconds with LRAD to demo improved balance    Baseline 45.81 secs    Time 8    Period Weeks    Status New      PT LONG TERM GOAL #4   Title Patient will improve Berg Balance to >/= 40/56 to demonstrate improved balance and reduced fall risk    Baseline 25/56  Time 8    Period Weeks    Status New      PT LONG TERM GOAL #5   Title LTG to be set for Gait Speed    Baseline TBA    Time 8    Period Weeks     Status New      Additional Long Term Goals   Additional Long Term Goals Yes      PT LONG TERM GOAL #6   Title Patient will be able to ambulate >/= 400 ft with LRAD and Mod I to demonstrate improved household mobility/community mobility    Baseline 75    Time 8    Period Weeks    Status New                   Plan - 07/16/20 1621     Clinical Impression Statement Todays session focused on seated eercises due to apprehension from recent fall and loss of confidence with standing tasks.  Able to perform all seated tsks and exercises w/o issue and then able to advance to standing tasks in // bars including marching, stepping and SLS tasks with vectors including stair blocks and floor targets(pebbles)    Personal Factors and Comorbidities Comorbidity 3+;Transportation;Time since onset of injury/illness/exacerbation    Comorbidities CHF, A-Fib, Arthritis, CAD, SKD, DM Type 2, GERD, CVA, HTN, HLD, ICH    Examination-Activity Limitations Bend;Stairs;Stand;Locomotion Level;Transfers    Examination-Participation Restrictions Driving;Community Activity;Cleaning    Stability/Clinical Decision Making Stable/Uncomplicated    Rehab Potential Good    PT Frequency 2x / week    PT Duration 8 weeks    PT Treatment/Interventions ADLs/Self Care Home Management;Aquatic Therapy;Cryotherapy;Electrical Stimulation;Moist Heat;DME Instruction;Gait training;Stair training;Functional mobility training;Therapeutic activities;Therapeutic exercise;Balance training;Neuromuscular re-education;Patient/family education;Orthotic Fit/Training;Manual techniques;Passive range of motion;Dry needling;Joint Manipulations    PT Next Visit Plan Assess standing/walking confidence following recent fall and continue with SLS, stepping and mobility skills    PT Home Exercise Plan [id number]    Consulted and Agree with Plan of Care Patient             Patient will benefit from skilled therapeutic intervention in order to  improve the following deficits and impairments:  Abnormal gait, Decreased balance, Decreased mobility, Difficulty walking, Postural dysfunction, Decreased strength, Decreased coordination, Decreased activity tolerance, Decreased knowledge of use of DME, Impaired tone, Impaired sensation  Visit Diagnosis: Other abnormalities of gait and mobility  Muscle weakness (generalized)  Difficulty in walking, not elsewhere classified  Unsteadiness on feet     Problem List Patient Active Problem List   Diagnosis Date Noted   Chronic diastolic CHF (congestive heart failure) ([place]) 10/28/2017   History of cerebrovascular accident (CVA) with residual deficit 05/04/2017   CKD (chronic kidney disease) stage 3, GFR 30-59 ml/min ([facility]) 05/04/2017   GERD (gastroesophageal reflux disease) 02/08/2015   HTN (hypertension) 07/01/2012   ICH (intracerebral hemorrhage) ([place]) 12/22/2010   Hyperlipemia 11/21/2010   Gout 11/20/2010   CAD (coronary artery disease) 11/20/2010   Stroke ([place]) 11/19/2010   Diabetes type 2, controlled ([place]) 11/19/2010   Permanent atrial fibrillation 11/19/2010    Lanice Shirts PT 07/16/2020, 4:31 PM  [facility] [address] [place], [place], [address] Phone: [phone]   Fax:  [phone]  Name: [name] MRN: [id number] Date of Birth: 1955/12/11

## 2020-07-17 ENCOUNTER — Telehealth: Payer: Self-pay

## 2020-07-17 NOTE — Telephone Encounter (Signed)
Dr. Manuella Ghazi, Olyn Landstrom was evaluated by Physical Therapy on 07/01/20.  The patient would benefit from Occupational Therapy evaluation for Hemiparesis due to Chronic CVA.   If you agree, please place an order in [facility] workque in [facility] or fax the order to [phone]. Thank you, Guillermina City, PT, [facility] [address] [place], [place]  [address] Phone:  [phone] Fax:  [phone]

## 2020-07-18 ENCOUNTER — Ambulatory Visit: Payer: Medicare Other

## 2020-07-18 ENCOUNTER — Other Ambulatory Visit: Payer: Self-pay

## 2020-07-18 DIAGNOSIS — R262 Difficulty in walking, not elsewhere classified: Secondary | ICD-10-CM

## 2020-07-18 DIAGNOSIS — R2681 Unsteadiness on feet: Secondary | ICD-10-CM

## 2020-07-18 DIAGNOSIS — R2689 Other abnormalities of gait and mobility: Secondary | ICD-10-CM

## 2020-07-18 DIAGNOSIS — M6281 Muscle weakness (generalized): Secondary | ICD-10-CM

## 2020-07-18 NOTE — Therapy (Signed)
[facility] [address] [place], [place], [address] Phone: [phone]   Fax:  [phone]  Physical Therapy Treatment  Patient Details  Name: [name] MRN: [id number] Date of Birth: 11-Jan-1956 Referring Provider (PT): Rochel Brome, MD   Encounter Date: 07/18/2020   PT End of Session - 07/18/20 1537     Visit Number 5    Number of Visits 17    Date for PT Re-Evaluation 08/30/20    Authorization Type UHC Medicare (10th Visit PN)    Progress Note Due on Visit 10    PT Start Time 1533    PT Stop Time 1614    PT Time Calculation (min) 41 min    Equipment Utilized During Treatment Gait belt    Activity Tolerance Patient tolerated treatment well    Behavior During Therapy [facility] for tasks assessed/performed             Past Medical History:  Diagnosis Date   A-fib ([place]) 11/19/2010   Acute exacerbation of congestive heart failure ([place]) 11/19/2010   Arthritis    CAD (coronary artery disease) 11/20/2010   CHF (congestive heart failure) ([place])    CKD (chronic kidney disease) stage 3, GFR 30-59 ml/min ([place]) 05/04/2017   Coronary artery disease    Diabetes mellitus    Diabetes mellitus 11/19/2010   Diabetes type 2, controlled ([place]) 11/19/2010   GERD (gastroesophageal reflux disease) 02/08/2015   Gout    Gout 11/20/2010   History of cerebrovascular accident (CVA) with residual deficit 05/04/2017   HTN (hypertension) 07/01/2012   Hyperlipemia 11/21/2010   Hypertension    Hypertensive emergency 11/19/2010   ICH (intracerebral hemorrhage) ([place]) 12/22/2010   Physical deconditioning 12/22/2010   Pulmonary edema 11/19/2010   Respiratory failure ([place]) 11/19/2010   Shortness of breath    Stroke ([place]) 11/19/2010   Thyroiditis 11/20/2010    Past Surgical History:  Procedure Laterality Date   PEG PLACEMENT  12/03/2010   Procedure: PERCUTANEOUS ENDOSCOPIC GASTROSTOMY (PEG) PLACEMENT;  Surgeon: Lafayette Dragon, MD;  [facility]  ENDOSCOPY;  Service: Endoscopy;  Laterality: N/A;   TRACHEOSTOMY TUBE PLACEMENT  11/28/2010   Procedure: TRACHEOSTOMY;  Surgeon: Beckie Salts, MD;  Location: [place];  Service: ENT;  Laterality: N/A;    There were no vitals filed for this visit.   Subjective Assessment - 07/18/20 1536     Subjective Patient reports feels like the legs are getting stronger. Skin abrasion is healing well. Not ambulating in therapy session, use RW today.    Pertinent History CHF, A-Fib, Arthritis, CAD, SKD, DM Type 2, GERD, CVA, HTN, HLD, ICH    Limitations Standing;Walking;House hold activities    How long can you walk comfortably? 5 minutes    Patient Stated Goals Improve Mobility    Currently in Pain? No/denies                               [facility] Adult PT Treatment/Exercise - 07/18/20 0001       Transfers   Transfers Sit to Stand;Stand to Sit    Sit to Stand 4: Min guard    Stand to Sit 4: Min guard    Comments from w/c      Ambulation/Gait   Ambulation/Gait Yes    Ambulation/Gait Assistance 5: Supervision;4: Min guard    Ambulation/Gait Assistance Details from w/c to // bars w/ RW, CGA    Ambulation  Distance (Feet) --   clinic distance   Assistive device Rolling walker    Gait Pattern Step-to pattern;Decreased arm swing - right;Decreased arm swing - left;Decreased step length - right;Decreased step length - left;Decreased hip/knee flexion - right;Decreased hip/knee flexion - left;Wide base of support;Poor foot clearance - right;Poor foot clearance - left    Ambulation Surface Level;Indoor      Self-Care   Self-Care Other Self-Care Comments    Other Self-Care Comments  Patient rquesting PT to show how to pull up HEP using QR code. PT reprinted HEP for patient and taught how to use cell phone to bring up exercises for improved independence with completion.                 Balance Exercises - 07/18/20 0001       Balance Exercises: Standing   Standing Eyes Opened  Narrow base of support (BOS);Head turns;Solid surface;Limitations    Standing Eyes Opened Limitations standing narrow BOS completing horiz/vertical head turns x 10 reps, progressed to more narrow BOS.    Standing Eyes Closed Wide (BOA);Solid surface;3 reps;30 secs;Limitations   increased postural sway, CGA required   Stepping Strategy Anterior;Lateral;UE support;Limitations;Posterior    Stepping Strategy Limitations in // bars; completed lateral, posterior, and anterior stepping strategy with UE support x 10 reps each direction. PT providing manual facilation for improved weight shift, as have significant challenge with posterior and lateral weight shift.    Other Standing Exercises In // bars with measuring sticks as target, completed alternating step to pattern to promote improved step length bilaterally, increased challenge with placement on RLE due to coordination deficits. BUE support required. Completed x 3 laps down and back, verbal cues for sequencing. Unable to complete reciprocal stepping due to imbalance.               PT Education - 07/18/20 1635     Education Details see self care    Person(s) Educated Patient    Methods Explanation;Demonstration    Comprehension Verbalized understanding;Returned demonstration              PT Short Term Goals - 07/09/20 1615       PT SHORT TERM GOAL #1   Title Patient will be independent with initial HEP focused on balance/strength (All STGs Due: 08/02/20)    Baseline no HEP established    Time 4    Period Weeks    Status New    Target Date 08/02/20      PT SHORT TERM GOAL #2   Title Patient will improve TUG to </= 35 seconds to demonstrate improved balance and functional mobility    Baseline 45.81 secs    Time 4    Period Weeks    Status New      PT SHORT TERM GOAL #3   Title Patient will improve Berg Balance to >/= 30/56 to demonstrate improved balance    Baseline 25/56    Time 4    Period Weeks    Status New      PT  SHORT TERM GOAL #4   Title Gait Speed to be assessed and STG/LTG to be updated as appropriate; 07/09/20 Goal is 0.25 m/s    Baseline TBA; 07/09/20 gait speed 0.18 m/s    Time 4    Period Weeks    Status New      PT SHORT TERM GOAL #5   Title Patient will verbalize understanding of fall prevention to promote safety within the home/community  Baseline dependent    Time 4    Period Weeks    Status New               PT Long Term Goals - 07/01/20 1755       PT LONG TERM GOAL #1   Title Patient will be independent with final HEP focused on balance/strength (All LTGs Due: 08/30/20)    Baseline no HEP established    Time 8    Period Weeks    Status New    Target Date 08/30/20      PT LONG TERM GOAL #2   Title Patient will improve 5x sit <> stand to </= 15 seconds with UE support to demo improved balance    Baseline 19.82 secs    Time 8    Period Weeks    Status New      PT LONG TERM GOAL #3   Title Patient will improve TUG to </= 25 seconds with LRAD to demo improved balance    Baseline 45.81 secs    Time 8    Period Weeks    Status New      PT LONG TERM GOAL #4   Title Patient will improve Berg Balance to >/= 40/56 to demonstrate improved balance and reduced fall risk    Baseline 25/56    Time 8    Period Weeks    Status New      PT LONG TERM GOAL #5   Title LTG to be set for Gait Speed    Baseline TBA    Time 8    Period Weeks    Status New      Additional Long Term Goals   Additional Long Term Goals Yes      PT LONG TERM GOAL #6   Title Patient will be able to ambulate >/= 400 ft with LRAD and Mod I to demonstrate improved household mobility/community mobility    Baseline 75    Time 8    Period Weeks    Status New                   Plan - 07/18/20 1636     Clinical Impression Statement Continued today's session focused on standing balance with BUE support, patient continue to demo apprehension with ambulation due to recent fall therefore  using w/c to enter/exit PT session. Continued standing balance working safely to promote balance and iproved self condifence. Increased challenge with stepping strategy noted. WIll continue to progress toward all LTGs.    Personal Factors and Comorbidities Comorbidity 3+;Transportation;Time since onset of injury/illness/exacerbation    Comorbidities CHF, A-Fib, Arthritis, CAD, SKD, DM Type 2, GERD, CVA, HTN, HLD, ICH    Examination-Activity Limitations Bend;Stairs;Stand;Locomotion Level;Transfers    Examination-Participation Restrictions Driving;Community Activity;Cleaning    Stability/Clinical Decision Making Stable/Uncomplicated    Rehab Potential Good    PT Frequency 2x / week    PT Duration 8 weeks    PT Treatment/Interventions ADLs/Self Care Home Management;Aquatic Therapy;Cryotherapy;Electrical Stimulation;Moist Heat;DME Instruction;Gait training;Stair training;Functional mobility training;Therapeutic activities;Therapeutic exercise;Balance training;Neuromuscular re-education;Patient/family education;Orthotic Fit/Training;Manual techniques;Passive range of motion;Dry needling;Joint Manipulations    PT Next Visit Plan Assess standing/walking confidence following recent fall and continue with SLS, stepping and mobility skills. Trial NuStep/Scifit for Recirpocal movement    PT Home Exercise Plan [id number]    Consulted and Agree with Plan of Care Patient             Patient will benefit from skilled therapeutic intervention  in order to improve the following deficits and impairments:  Abnormal gait, Decreased balance, Decreased mobility, Difficulty walking, Postural dysfunction, Decreased strength, Decreased coordination, Decreased activity tolerance, Decreased knowledge of use of DME, Impaired tone, Impaired sensation  Visit Diagnosis: Other abnormalities of gait and mobility  Difficulty in walking, not elsewhere classified  Muscle weakness (generalized)  Unsteadiness on  feet     Problem List Patient Active Problem List   Diagnosis Date Noted   Chronic diastolic CHF (congestive heart failure) ([place]) 10/28/2017   History of cerebrovascular accident (CVA) with residual deficit 05/04/2017   CKD (chronic kidney disease) stage 3, GFR 30-59 ml/min ([place]) 05/04/2017   GERD (gastroesophageal reflux disease) 02/08/2015   HTN (hypertension) 07/01/2012   ICH (intracerebral hemorrhage) ([place]) 12/22/2010   Hyperlipemia 11/21/2010   Gout 11/20/2010   CAD (coronary artery disease) 11/20/2010   Stroke ([place]) 11/19/2010   Diabetes type 2, controlled ([place]) 11/19/2010   Permanent atrial fibrillation 11/19/2010    Jones Bales, PT, DPT 07/18/2020, 4:39 PM  [facility] [address] [place], [place], [address] Phone: [phone]   Fax:  [phone]  Name: [name] MRN: [id number] Date of Birth: 07-20-1955

## 2020-07-23 ENCOUNTER — Ambulatory Visit: Payer: Medicare Other

## 2020-07-23 ENCOUNTER — Other Ambulatory Visit: Payer: Self-pay

## 2020-07-23 DIAGNOSIS — R262 Difficulty in walking, not elsewhere classified: Secondary | ICD-10-CM

## 2020-07-23 DIAGNOSIS — R2681 Unsteadiness on feet: Secondary | ICD-10-CM

## 2020-07-23 DIAGNOSIS — M6281 Muscle weakness (generalized): Secondary | ICD-10-CM

## 2020-07-23 DIAGNOSIS — R2689 Other abnormalities of gait and mobility: Secondary | ICD-10-CM

## 2020-07-23 NOTE — Therapy (Signed)
[facility] [address] [place], [place], [address] Phone: [phone]   Fax:  [phone]  Physical Therapy Treatment  Patient Details  Name: [name] MRN: [id number] Date of Birth: [birth year] Referring Provider (PT): Rochel Brome, MD   Encounter Date: 07/23/2020   PT End of Session - 07/23/20 1419     Visit Number 6    Number of Visits 17    Date for PT Re-Evaluation 08/30/20    Authorization Type [employer] Medicare (10th Visit PN)    Progress Note Due on Visit 10    PT Start Time 1315    PT Stop Time 1400    PT Time Calculation (min) 45 min    Equipment Utilized During Treatment Gait belt    Activity Tolerance Patient tolerated treatment well    Behavior During Therapy [facility] for tasks assessed/performed             Past Medical History:  Diagnosis Date   A-fib ([place]) 11/19/2010   Acute exacerbation of congestive heart failure ([place]) 11/19/2010   Arthritis    CAD (coronary artery disease) 11/20/2010   CHF (congestive heart failure) ([place])    CKD (chronic kidney disease) stage 3, GFR 30-59 ml/min ([place]) 05/04/2017   Coronary artery disease    Diabetes mellitus    Diabetes mellitus 11/19/2010   Diabetes type 2, controlled ([place]) 11/19/2010   GERD (gastroesophageal reflux disease) 02/08/2015   Gout    Gout 11/20/2010   History of cerebrovascular accident (CVA) with residual deficit 05/04/2017   HTN (hypertension) 07/01/2012   Hyperlipemia 11/21/2010   Hypertension    Hypertensive emergency 11/19/2010   ICH (intracerebral hemorrhage) ([place]) 12/22/2010   Physical deconditioning 12/22/2010   Pulmonary edema 11/19/2010   Respiratory failure ([place]) 11/19/2010   Shortness of breath    Stroke ([place]) 11/19/2010   Thyroiditis 11/20/2010    Past Surgical History:  Procedure Laterality Date   PEG PLACEMENT  12/03/2010   Procedure: PERCUTANEOUS ENDOSCOPIC GASTROSTOMY (PEG) PLACEMENT;  Surgeon: Lafayette Dragon, MD;  Location: [facility]  ENDOSCOPY;  Service: Endoscopy;  Laterality: N/A;   TRACHEOSTOMY TUBE PLACEMENT  11/28/2010   Procedure: TRACHEOSTOMY;  Surgeon: Beckie Salts, MD;  Location: [facility];  Service: ENT;  Laterality: N/A;    There were no vitals filed for this visit.   Subjective Assessment - 07/23/20 1417     Subjective Feels confident to resume ambulation in RW in clinic. no additional falls to report    Pertinent History CHF, A-Fib, Arthritis, CAD, SKD, DM Type 2, GERD, CVA, HTN, HLD, ICH    Limitations Standing;Walking;House hold activities    How long can you walk comfortably? 5 minutes    Patient Stated Goals Improve Mobility                               [facility] Adult PT Treatment/Exercise - 07/23/20 0001       Ambulation/Gait   Ambulation/Gait Yes    Ambulation/Gait Assistance 5: Supervision;4: Min guard    Ambulation/Gait Assistance Details in clinic b/t equipment    Ambulation Distance (Feet) 100 Feet    Assistive device Rolling walker    Gait Pattern Step-to pattern;Decreased arm swing - right;Decreased arm swing - left;Decreased step length - right;Decreased step length - left;Decreased hip/knee flexion - right;Decreased hip/knee flexion - left;Wide base of support;Poor foot clearance - right;Poor foot clearance - left    Ambulation  Surface Level;Indoor      Knee/Hip Exercises: Aerobic   Other Aerobic Scifit L1 arms 6      Knee/Hip Exercises: Standing   Other Standing Knee Exercises marching in walker, alt 10x    Other Standing Knee Exercises fwd stepping in walker, unilateral, 10x per leg      Knee/Hip Exercises: Seated   Long Arc Quad Both;1 set;15 reps;Weights;Limitations    Long Arc Quad Weight 1 lbs.    Long Arc Quad Limitations alternating    Heel Slides Strengthening;Both;1 set;15 reps;Limitations    Heel Slides Limitations alt pattern to simulate gait    Other Seated Knee/Hip Exercises taping floor targets alt.pattern 10x per leg, starting with 2 then 1     Marching Strengthening;Both;1 set;15 reps;Weights;Limitations    Marching Limitations alt    Marching Weights 1 lbs.                 Balance Exercises - 07/23/20 0001       Balance Exercises: Standing   SLS with Vectors Solid surface;Upper extremity assist 2;Limitations    SLS with Vectors Limitations in RW, tapping floor targets, starting with 2 then 1 target, alternatiing pattern for 10 reps per LE    Stepping Strategy Anterior;UE support;10 reps;Limitations    Stepping Strategy Limitations in walkerm stepping fwd 10x per LE cuing for big steps                 PT Short Term Goals - 07/09/20 1615       PT SHORT TERM GOAL #1   Title Patient will be independent with initial HEP focused on balance/strength (All STGs Due: 08/02/20)    Baseline no HEP established    Time 4    Period Weeks    Status New    Target Date 08/02/20      PT SHORT TERM GOAL #2   Title Patient will improve TUG to </= 35 seconds to demonstrate improved balance and functional mobility    Baseline 45.81 secs    Time 4    Period Weeks    Status New      PT SHORT TERM GOAL #3   Title Patient will improve Berg Balance to >/= 30/56 to demonstrate improved balance    Baseline 25/56    Time 4    Period Weeks    Status New      PT SHORT TERM GOAL #4   Title Gait Speed to be assessed and STG/LTG to be updated as appropriate; 07/09/20 Goal is 0.25 m/s    Baseline TBA; 07/09/20 gait speed 0.18 m/s    Time 4    Period Weeks    Status New      PT SHORT TERM GOAL #5   Title Patient will verbalize understanding of fall prevention to promote safety within the home/community    Baseline dependent    Time 4    Period Weeks    Status New               PT Long Term Goals - 07/01/20 1755       PT LONG TERM GOAL #1   Title Patient will be independent with final HEP focused on balance/strength (All LTGs Due: 08/30/20)    Baseline no HEP established    Time 8    Period Weeks    Status New     Target Date 08/30/20      PT LONG TERM GOAL #2   Title Patient  will improve 5x sit <> stand to </= 15 seconds with UE support to demo improved balance    Baseline 19.82 secs    Time 8    Period Weeks    Status New      PT LONG TERM GOAL #3   Title Patient will improve TUG to </= 25 seconds with LRAD to demo improved balance    Baseline 45.81 secs    Time 8    Period Weeks    Status New      PT LONG TERM GOAL #4   Title Patient will improve Berg Balance to >/= 40/56 to demonstrate improved balance and reduced fall risk    Baseline 25/56    Time 8    Period Weeks    Status New      PT LONG TERM GOAL #5   Title LTG to be set for Gait Speed    Baseline TBA    Time 8    Period Weeks    Status New      Additional Long Term Goals   Additional Long Term Goals Yes      PT LONG TERM GOAL #6   Title Patient will be able to ambulate >/= 400 ft with LRAD and Mod I to demonstrate improved household mobility/community mobility    Baseline 75    Time 8    Period Weeks    Status New                   Plan - 07/23/20 1419     Clinical Impression Statement Todays session added Scifit for aerobic work and reciprocal patterning, broke gait components down to single tasks hoping for carryover to ambulation.  Stepping tasks in seated and standing positions using unilateral and reciprocal patterns.  Patient much more fluid and accurate with seated tasks but limited carry over to gait.    Personal Factors and Comorbidities Comorbidity 3+;Transportation;Time since onset of injury/illness/exacerbation    Comorbidities CHF, A-Fib, Arthritis, CAD, SKD, DM Type 2, GERD, CVA, HTN, HLD, ICH    Examination-Activity Limitations Bend;Stairs;Stand;Locomotion Level;Transfers    Examination-Participation Restrictions Driving;Community Activity;Cleaning    Stability/Clinical Decision Making Stable/Uncomplicated    Rehab Potential Good    PT Frequency 2x / week    PT Duration 8 weeks    PT  Treatment/Interventions ADLs/Self Care Home Management;Aquatic Therapy;Cryotherapy;Electrical Stimulation;Moist Heat;DME Instruction;Gait training;Stair training;Functional mobility training;Therapeutic activities;Therapeutic exercise;Balance training;Neuromuscular re-education;Patient/family education;Orthotic Fit/Training;Manual techniques;Passive range of motion;Dry needling;Joint Manipulations    PT Next Visit Plan continue Scifit, break down gait pattern into individual tasks and assess carryover    PT Home Exercise Plan [email or web address]    Consulted and Agree with Plan of Care Patient             Patient will benefit from skilled therapeutic intervention in order to improve the following deficits and impairments:  Abnormal gait, Decreased balance, Decreased mobility, Difficulty walking, Postural dysfunction, Decreased strength, Decreased coordination, Decreased activity tolerance, Decreased knowledge of use of DME, Impaired tone, Impaired sensation  Visit Diagnosis: Other abnormalities of gait and mobility  Difficulty in walking, not elsewhere classified  Muscle weakness (generalized)  Unsteadiness on feet     Problem List Patient Active Problem List   Diagnosis Date Noted   Chronic diastolic CHF (congestive heart failure) ([place]) 10/28/2017   History of cerebrovascular accident (CVA) with residual deficit 05/04/2017   CKD (chronic kidney disease) stage 3, GFR 30-59 ml/min ([place]) 05/04/2017   GERD (gastroesophageal reflux disease) 02/08/2015  HTN (hypertension) 07/01/2012   ICH (intracerebral hemorrhage) ([place]) 12/22/2010   Hyperlipemia 11/21/2010   Gout 11/20/2010   CAD (coronary artery disease) 11/20/2010   Stroke ([place]) 11/19/2010   Diabetes type 2, controlled ([place]) 11/19/2010   Permanent atrial fibrillation 11/19/2010    Lanice Shirts PT 07/23/2020, 2:24 PM  [facility] [address] [place], [place],  [address] Phone: [phone]   Fax:  [phone]  Name: LUAN MABERRY MRN: [id number] Date of Birth: 04/07/1955

## 2020-07-25 ENCOUNTER — Other Ambulatory Visit: Payer: Self-pay

## 2020-07-25 ENCOUNTER — Ambulatory Visit: Payer: Medicare Other

## 2020-07-25 DIAGNOSIS — R2689 Other abnormalities of gait and mobility: Secondary | ICD-10-CM | POA: Diagnosis not present

## 2020-07-25 DIAGNOSIS — R262 Difficulty in walking, not elsewhere classified: Secondary | ICD-10-CM

## 2020-07-25 DIAGNOSIS — M6281 Muscle weakness (generalized): Secondary | ICD-10-CM

## 2020-07-25 NOTE — Therapy (Signed)
[facility] [address] [place], [place], [address] Phone: [phone]   Fax:  [phone]  Physical Therapy Treatment  Patient Details  Name: [name] MRN: [id number] Date of Birth: 31-Mar-1955 Referring Provider (PT): Rochel Brome, MD   Encounter Date: 07/25/2020    Past Medical History:  Diagnosis Date   A-fib [facility] 11/19/2010   Acute exacerbation of congestive heart failure ([place]) 11/19/2010   Arthritis    CAD (coronary artery disease) 11/20/2010   CHF (congestive heart failure) ([place])    CKD (chronic kidney disease) stage 3, GFR 30-59 ml/min ([place]) 05/04/2017   Coronary artery disease    Diabetes mellitus    Diabetes mellitus 11/19/2010   Diabetes type 2, controlled ([place]) 11/19/2010   GERD (gastroesophageal reflux disease) 02/08/2015   Gout    Gout 11/20/2010   History of cerebrovascular accident (CVA) with residual deficit 05/04/2017   HTN (hypertension) 07/01/2012   Hyperlipemia 11/21/2010   Hypertension    Hypertensive emergency 11/19/2010   ICH (intracerebral hemorrhage) ([place]) 12/22/2010   Physical deconditioning 12/22/2010   Pulmonary edema 11/19/2010   Respiratory failure ([place]) 11/19/2010   Shortness of breath    Stroke ([place]) 11/19/2010   Thyroiditis 11/20/2010    Past Surgical History:  Procedure Laterality Date   PEG PLACEMENT  12/03/2010   Procedure: PERCUTANEOUS ENDOSCOPIC GASTROSTOMY (PEG) PLACEMENT;  Surgeon: Lafayette Dragon, MD;  [facility] ENDOSCOPY;  Service: Endoscopy;  Laterality: N/A;   TRACHEOSTOMY TUBE PLACEMENT  11/28/2010   Procedure: TRACHEOSTOMY;  Surgeon: Beckie Salts, MD;  Location: [facility];  Service: ENT;  Laterality: N/A;    There were no vitals filed for this visit.                      [facility] Adult PT Treatment/Exercise - 07/25/20 0001       Transfers   Transfers Sit to Stand;Stand to Sit    Sit to Stand 4: Min guard    Stand to Sit 4: Min assist     Comments 5 reps encouraging narrow BOS      Ambulation/Gait   Ambulation/Gait Yes    Ambulation/Gait Assistance 4: Min guard    Ambulation/Gait Assistance Details b/t equipment    Ambulation Distance (Feet) 50 Feet    Assistive device Rolling walker    Gait Pattern Step-to pattern;Decreased arm swing - right;Decreased arm swing - left;Decreased step length - right;Decreased step length - left;Decreased hip/knee flexion - right;Decreased hip/knee flexion - left;Wide base of support;Poor foot clearance - right;Poor foot clearance - left    Ambulation Surface Level;Indoor      Knee/Hip Exercises: Aerobic   Other Aerobic Scifit L1 arms 8 8'      Knee/Hip Exercises: Seated   Long Arc Quad Both;1 set;15 reps;Weights;Limitations    Long Arc Quad Weight 2 lbs.    Long Arc Quad Limitations alternating    Heel Slides Strengthening;Both;1 set;15 reps;Limitations    Heel Slides Limitations alt pattern to simulate gait    Marching Strengthening;Both;1 set;15 reps;Weights;Limitations    Marching Limitations alt    Marching Weights 2 lbs.                 Balance Exercises - 07/25/20 0001       Balance Exercises: Standing   SLS with Vectors Solid surface;Upper extremity assist 2;Limitations    SLS with Vectors Limitations in RW, tapping floor targets, starting with 2 then 1 target, alternatiing pattern  for 10 reps per LE    Tandem Gait Forward;Upper extremity support;5 reps;Limitations    Tandem Gait Limitations 5 trips in // bars with UE support    Sidestepping Upper extremity support;5 reps;Limitations    Sidestepping Limitations 5 trips in // bars encouraging narrow BOS                 PT Short Term Goals - 07/09/20 1615       PT SHORT TERM GOAL #1   Title Patient will be independent with initial HEP focused on balance/strength (All STGs Due: 08/02/20)    Baseline no HEP established    Time 4    Period Weeks    Status New    Target Date 08/02/20      PT SHORT TERM  GOAL #2   Title Patient will improve TUG to </= 35 seconds to demonstrate improved balance and functional mobility    Baseline 45.81 secs    Time 4    Period Weeks    Status New      PT SHORT TERM GOAL #3   Title Patient will improve Berg Balance to >/= 30/56 to demonstrate improved balance    Baseline 25/56    Time 4    Period Weeks    Status New      PT SHORT TERM GOAL #4   Title Gait Speed to be assessed and STG/LTG to be updated as appropriate; 07/09/20 Goal is 0.25 m/s    Baseline TBA; 07/09/20 gait speed 0.18 m/s    Time 4    Period Weeks    Status New      PT SHORT TERM GOAL #5   Title Patient will verbalize understanding of fall prevention to promote safety within the home/community    Baseline dependent    Time 4    Period Weeks    Status New               PT Long Term Goals - 07/01/20 1755       PT LONG TERM GOAL #1   Title Patient will be independent with final HEP focused on balance/strength (All LTGs Due: 08/30/20)    Baseline no HEP established    Time 8    Period Weeks    Status New    Target Date 08/30/20      PT LONG TERM GOAL #2   Title Patient will improve 5x sit <> stand to </= 15 seconds with UE support to demo improved balance    Baseline 19.82 secs    Time 8    Period Weeks    Status New      PT LONG TERM GOAL #3   Title Patient will improve TUG to </= 25 seconds with LRAD to demo improved balance    Baseline 45.81 secs    Time 8    Period Weeks    Status New      PT LONG TERM GOAL #4   Title Patient will improve Berg Balance to >/= 40/56 to demonstrate improved balance and reduced fall risk    Baseline 25/56    Time 8    Period Weeks    Status New      PT LONG TERM GOAL #5   Title LTG to be set for Gait Speed    Baseline TBA    Time 8    Period Weeks    Status New      Additional Long Term Goals  Additional Long Term Goals Yes      PT LONG TERM GOAL #6   Title Patient will be able to ambulate >/= 400 ft with LRAD and  Mod I to demonstrate improved household mobility/community mobility    Baseline 75    Time 8    Period Weeks    Status New                   Plan - 07/25/20 1452     Clinical Impression Statement Todays session consisted of gait and balance activities in seated and standing position, encouraged patient to adopt a narrow BOS withtransfers and activities in // bars, continues with a wide BOS when ambulating with minmal hip and knee flexion and oustretched arms.  Able to demo a narrow BOS when cued and with UE support    PT Next Visit Plan continue Scifit, break down gait pattern into individual tasks and assess carryover, encourage a narroe BOS with activity    PT Home Exercise Plan [id number]             Patient will benefit from skilled therapeutic intervention in order to improve the following deficits and impairments:     Visit Diagnosis: Other abnormalities of gait and mobility  Difficulty in walking, not elsewhere classified  Muscle weakness (generalized)     Problem List Patient Active Problem List   Diagnosis Date Noted   Chronic diastolic CHF (congestive heart failure) ([place]) 10/28/2017   History of cerebrovascular accident (CVA) with residual deficit 05/04/2017   CKD (chronic kidney disease) stage 3, GFR 30-59 ml/min ([facility]) 05/04/2017   GERD (gastroesophageal reflux disease) 02/08/2015   HTN (hypertension) 07/01/2012   ICH (intracerebral hemorrhage) ([place]) 12/22/2010   Hyperlipemia 11/21/2010   Gout 11/20/2010   CAD (coronary artery disease) 11/20/2010   Stroke ([place]) 11/19/2010   Diabetes type 2, controlled ([place]) 11/19/2010   Permanent atrial fibrillation 11/19/2010    Jacqulynn Cadet Ariyah Sedlack 07/25/2020, 3:50 PM  [facility] [address] [place], [place], [address] Phone: [phone]   Fax:  [phone]  Name: [name] MRN: [id number] Date of Birth: 1955/10/15

## 2020-07-30 ENCOUNTER — Ambulatory Visit: Payer: Medicare Other | Admitting: Physical Therapy

## 2020-08-01 ENCOUNTER — Ambulatory Visit: Payer: Medicare Other | Admitting: Physical Therapy

## 2020-08-01 ENCOUNTER — Other Ambulatory Visit: Payer: Self-pay

## 2020-08-01 DIAGNOSIS — R278 Other lack of coordination: Secondary | ICD-10-CM

## 2020-08-01 DIAGNOSIS — M6281 Muscle weakness (generalized): Secondary | ICD-10-CM

## 2020-08-01 DIAGNOSIS — R262 Difficulty in walking, not elsewhere classified: Secondary | ICD-10-CM

## 2020-08-01 DIAGNOSIS — R2681 Unsteadiness on feet: Secondary | ICD-10-CM

## 2020-08-01 DIAGNOSIS — R2689 Other abnormalities of gait and mobility: Secondary | ICD-10-CM

## 2020-08-01 NOTE — Therapy (Signed)
[facility] [address] [place], [place], [address] Phone: [phone]   Fax:  [phone]  Physical Therapy Treatment  Patient Details  Name: [name] MRN: [id number] Date of Birth: 05-09-1955 Referring Provider (PT): Rochel Brome, MD   Encounter Date: 08/01/2020   PT End of Session - 08/01/20 1457     Visit Number 8    Number of Visits 17    Date for PT Re-Evaluation 08/30/20    Authorization Type U[employer] Medicare (10th Visit PN)    Progress Note Due on Visit 10    PT Start Time 1450    PT Stop Time 1530    PT Time Calculation (min) 40 min    Equipment Utilized During Treatment Gait belt    Activity Tolerance Patient tolerated treatment well    Behavior During Therapy [facility] for tasks assessed/performed             Past Medical History:  Diagnosis Date   A-fib ([place]) 11/19/2010   Acute exacerbation of congestive heart failure ([place]) 11/19/2010   Arthritis    CAD (coronary artery disease) 11/20/2010   CHF (congestive heart failure) ([facility])    CKD (chronic kidney disease) stage 3, GFR 30-59 ml/min ([place]) 05/04/2017   Coronary artery disease    Diabetes mellitus    Diabetes mellitus 11/19/2010   Diabetes type 2, controlled ([place]) 11/19/2010   GERD (gastroesophageal reflux disease) 02/08/2015   Gout    Gout 11/20/2010   History of cerebrovascular accident (CVA) with residual deficit 05/04/2017   HTN (hypertension) 07/01/2012   Hyperlipemia 11/21/2010   Hypertension    Hypertensive emergency 11/19/2010   ICH (intracerebral hemorrhage) ([place]) 12/22/2010   Physical deconditioning 12/22/2010   Pulmonary edema 11/19/2010   Respiratory failure ([place]) 11/19/2010   Shortness of breath    Stroke ([place]) 11/19/2010   Thyroiditis 11/20/2010    Past Surgical History:  Procedure Laterality Date   PEG PLACEMENT  12/03/2010   Procedure: PERCUTANEOUS ENDOSCOPIC GASTROSTOMY (PEG) PLACEMENT;  Surgeon: Lafayette Dragon, MD;  [facility]: [facility]  ENDOSCOPY;  Service: Endoscopy;  Laterality: N/A;   TRACHEOSTOMY TUBE PLACEMENT  11/28/2010   Procedure: TRACHEOSTOMY;  Surgeon: Beckie Salts, MD;  Location: [facility];  Service: ENT;  Laterality: N/A;    There were no vitals filed for this visit.   Subjective Assessment - 08/01/20 1455     Subjective Pt reports nothing new or different. No falls to report. Pt left RW in the [place]. Pt notes he is using RW all the time at home.    Pertinent History CHF, A-Fib, Arthritis, CAD, SKD, DM Type 2, GERD, CVA, HTN, HLD, ICH    Limitations Standing;Walking;House hold activities    How long can you walk comfortably? 5 minutes    Patient Stated Goals Improve Mobility    Currently in Pain? No/denies                   [facility] Adult PT Treatment/Exercise - 08/01/20 0001       Knee/Hip Exercises: Aerobic   Other Aerobic Scifit L1 arms 8 5'              in // bars:   L<>R weight shift 3x10  L<>R weight shift with alternating heel raise 3x10 progressing from bilat hand holds to no hand holds with narrower BOS  Forward/backward weight shift 3x10, focus on knee flexion and ext with forward heel strike (tactile cues to keep from circumduction or  hip hiking)  Feet together EO 2x20 sec  Feet apart EC 2x20 sec  Ambulating with RW:  L<>R weight shift with narrower BOS 3x10  Forward/backward weight shift 3x10, focus on knee flexion and ext with forward heel strike (tactile cues to keep from circumduction or hip hiking)  X115' cueing for weight shift, R LE coordination for swing phase without circumducting or hip hiking        PT Short Term Goals - 07/09/20 1615       PT SHORT TERM GOAL #1   Title Patient will be independent with initial HEP focused on balance/strength (All STGs Due: 08/02/20)    Baseline no HEP established    Time 4    Period Weeks    Status New    Target Date 08/02/20      PT SHORT TERM GOAL #2   Title Patient will improve TUG to </= 35 seconds to demonstrate  improved balance and functional mobility    Baseline 45.81 secs    Time 4    Period Weeks    Status New      PT SHORT TERM GOAL #3   Title Patient will improve Berg Balance to >/= 30/56 to demonstrate improved balance    Baseline 25/56    Time 4    Period Weeks    Status New      PT SHORT TERM GOAL #4   Title Gait Speed to be assessed and STG/LTG to be updated as appropriate; 07/09/20 Goal is 0.25 m/s    Baseline TBA; 07/09/20 gait speed 0.18 m/s    Time 4    Period Weeks    Status New      PT SHORT TERM GOAL #5   Title Patient will verbalize understanding of fall prevention to promote safety within the home/community    Baseline dependent    Time 4    Period Weeks    Status New               PT Long Term Goals - 07/01/20 1755       PT LONG TERM GOAL #1   Title Patient will be independent with final HEP focused on balance/strength (All LTGs Due: 08/30/20)    Baseline no HEP established    Time 8    Period Weeks    Status New    Target Date 08/30/20      PT LONG TERM GOAL #2   Title Patient will improve 5x sit <> stand to </= 15 seconds with UE support to demo improved balance    Baseline 19.82 secs    Time 8    Period Weeks    Status New      PT LONG TERM GOAL #3   Title Patient will improve TUG to </= 25 seconds with LRAD to demo improved balance    Baseline 45.81 secs    Time 8    Period Weeks    Status New      PT LONG TERM GOAL #4   Title Patient will improve Berg Balance to >/= 40/56 to demonstrate improved balance and reduced fall risk    Baseline 25/56    Time 8    Period Weeks    Status New      PT LONG TERM GOAL #5   Title LTG to be set for Gait Speed    Baseline TBA    Time 8    Period Weeks    Status New  Additional Long Term Goals   Additional Long Term Goals Yes      PT LONG TERM GOAL #6   Title Patient will be able to ambulate >/= 400 ft with LRAD and Mod I to demonstrate improved household mobility/community mobility     Baseline 75    Time 8    Period Weeks    Status New                   Plan - 08/01/20 1635     Clinical Impression Statement Focused on improving gait sequencing, decreasing BOS, and improving weight shifting.    Personal Factors and Comorbidities Comorbidity 3+;Transportation;Time since onset of injury/illness/exacerbation    Comorbidities CHF, A-Fib, Arthritis, CAD, SKD, DM Type 2, GERD, CVA, HTN, HLD, ICH    Examination-Activity Limitations Bend;Stairs;Stand;Locomotion Level;Transfers    Examination-Participation Restrictions Driving;Community Activity;Cleaning    Rehab Potential Good    PT Frequency 2x / week    PT Duration 8 weeks    PT Treatment/Interventions ADLs/Self Care Home Management;Aquatic Therapy;Cryotherapy;Electrical Stimulation;Moist Heat;DME Instruction;Gait training;Stair training;Functional mobility training;Therapeutic activities;Therapeutic exercise;Balance training;Neuromuscular re-education;Patient/family education;Orthotic Fit/Training;Manual techniques;Passive range of motion;Dry needling;Joint Manipulations    PT Next Visit Plan Check STGs! continue Scifit, break down gait pattern into individual tasks and assess carryover, encourage a narroe BOS with activity    PT Home Exercise Plan [phone]             Patient will benefit from skilled therapeutic intervention in order to improve the following deficits and impairments:  Abnormal gait, Decreased balance, Decreased mobility, Difficulty walking, Postural dysfunction, Decreased strength, Decreased coordination, Decreased activity tolerance, Decreased knowledge of use of DME, Impaired tone, Impaired sensation  Visit Diagnosis: Other abnormalities of gait and mobility  Difficulty in walking, not elsewhere classified  Muscle weakness (generalized)  Unsteadiness on feet  Other lack of coordination     Problem List Patient Active Problem List   Diagnosis Date Noted   Chronic diastolic CHF  (congestive heart failure) ([place]) 10/28/2017   History of cerebrovascular accident (CVA) with residual deficit 05/04/2017   CKD (chronic kidney disease) stage 3, GFR 30-59 ml/min ([place]) 05/04/2017   GERD (gastroesophageal reflux disease) 02/08/2015   HTN (hypertension) 07/01/2012   ICH (intracerebral hemorrhage) ([place]) 12/22/2010   Hyperlipemia 11/21/2010   Gout 11/20/2010   CAD (coronary artery disease) 11/20/2010   Stroke ([place]) 11/19/2010   Diabetes type 2, controlled ([place]) 11/19/2010   Permanent atrial fibrillation 11/19/2010    Quintus Premo April Ma L Sarahjane Matherly PT, DPT 08/01/2020, 4:43 PM  [facility] [address] [place], [place], [address] Phone: [phone]   Fax:  [phone]  Name: [name] MRN: [id number] Date of Birth: 05-03-1955

## 2020-08-06 ENCOUNTER — Other Ambulatory Visit: Payer: Self-pay

## 2020-08-06 ENCOUNTER — Ambulatory Visit: Payer: Medicare Other

## 2020-08-06 DIAGNOSIS — M6281 Muscle weakness (generalized): Secondary | ICD-10-CM

## 2020-08-06 DIAGNOSIS — R2689 Other abnormalities of gait and mobility: Secondary | ICD-10-CM

## 2020-08-06 DIAGNOSIS — R262 Difficulty in walking, not elsewhere classified: Secondary | ICD-10-CM

## 2020-08-06 DIAGNOSIS — R2681 Unsteadiness on feet: Secondary | ICD-10-CM

## 2020-08-06 NOTE — Therapy (Signed)
[facility] [address] [place], [place], [address] Phone: [phone]   Fax:  [phone]  Physical Therapy Treatment/Progress Note  Patient Details  Name: [name] MRN: [id number] Date of Birth: 1955/05/16 Referring Provider (PT): Rochel Brome, MD  Physical Therapy Progress Note   Dates of Reporting Period:07/01/20 - 08/06/20  See Note below for Objective Data and Assessment of Progress/Goals.  Thank you for the referral of this patient. Gu[place], PT, DPT   Encounter Date: 08/06/2020   PT End of Session - 08/06/20 1403     Visit Number 9    Number of Visits 17    Date for PT Re-Evaluation 08/30/20    Authorization Type UHC Medicare (10th Visit PN)    Progress Note Due on Visit 19   progress note completd on 9th visit   PT Start Time 1403    PT Stop Time 1442    PT Time Calculation (min) 39 min    Equipment Utilized During Treatment Gait belt    Activity Tolerance Patient tolerated treatment well    Behavior During Therapy WFL for tasks assessed/performed             Past Medical History:  Diagnosis Date   A-fib ([place]) 11/19/2010   Acute exacerbation of congestive heart failure ([place]) 11/19/2010   Arthritis    CAD (coronary artery disease) 11/20/2010   CHF (congestive heart failure) ([facility])    CKD (chronic kidney disease) stage 3, GFR 30-59 ml/min ([place]) 05/04/2017   Coronary artery disease    Diabetes mellitus    Diabetes mellitus 11/19/2010   Diabetes type 2, controlled ([place]) 11/19/2010   GERD (gastroesophageal reflux disease) 02/08/2015   Gout    Gout 11/20/2010   History of cerebrovascular accident (CVA) with residual deficit 05/04/2017   HTN (hypertension) 07/01/2012   Hyperlipemia 11/21/2010   Hypertension    Hypertensive emergency 11/19/2010   ICH (intracerebral hemorrhage) ([place]) 12/22/2010   Physical deconditioning 12/22/2010   Pulmonary edema 11/19/2010   Respiratory failure ([place])  11/19/2010   Shortness of breath    Stroke ([place]) 11/19/2010   Thyroiditis 11/20/2010    Past Surgical History:  Procedure Laterality Date   PEG PLACEMENT  12/03/2010   Procedure: PERCUTANEOUS ENDOSCOPIC GASTROSTOMY (PEG) PLACEMENT;  Surgeon: Lafayette Dragon, MD;  [facility] END[facility]COPY;  Service: Endoscopy;  Laterality: N/A;   TRACHEOSTOMY TUBE PLACEMENT  11/28/2010   Procedure: TRACHEOSTOMY;  Surgeon: Beckie Salts, MD;  Location: [facility];  Service: ENT;  Laterality: N/A;    There were no vitals filed for this visit.   Subjective Assessment - 08/06/20 1407     Subjective No new changes complaints. Continue to use transport chair to enter PT session. But is walking in the home with RW. No falls or pain to report.    Pertinent History CHF, A-Fib, Arthritis, CAD, SKD, DM Type 2, GERD, CVA, HTN, HLD, ICH    Limitations Standing;Walking;House hold activities    How long can you walk comfortably? 5 minutes    Patient Stated Goals Improve Mobility    Currently in Pain? No/denies                               [facility] Adult PT Treatment/Exercise - 08/06/20 0001       Transfers   Transfers Sit to Stand;Stand to Sit    Sit to Stand 4: Min guard  Stand to Sit 4: Min guard    Comments cues for narrow BOS/hip width      Ambulation/Gait   Ambulation/Gait Yes    Ambulation/Gait Assistance 4: Min guard    Ambulation/Gait Assistance Details completed gait with RW throughout therapy gym. continued cues for reduced arm extension and staying closer to RW, as well as cues for step length.    Ambulation Distance (Feet) 75 Feet    Assistive device Rolling walker    Gait Pattern Step-to pattern;Decreased arm swing - right;Decreased arm swing - left;Decreased step length - right;Decreased step length - left;Decreased hip/knee flexion - right;Decreased hip/knee flexion - left;Wide base of support;Poor foot clearance - right;Poor foot clearance - left    Ambulation Surface Level;Indoor     Gait velocity 36.97 secs = 0. 27 m/s      Standardized Balance Assessment   Standardized Balance Assessment Berg Balance Test;Timed Up and Go Test      Berg Balance Test   Sit to Stand Able to stand  independently using hands    Standing Unsupported Able to stand 2 minutes with supervision    Sitting with Back Unsupported but Feet Supported on Floor or Stool Able to sit safely and securely 2 minutes    Stand to Sit Controls descent by using hands    Transfers Needs one person to assist    Standing Unsupported with Eyes Closed Able to stand 10 seconds with supervision    Standing Ubsupported with Feet Together Needs help to attain position but able to stand for 30 seconds with feet together    From Standing, Reach Forward with Outstretched Arm Can reach forward >12 cm safely (5")   6"   From Standing Position, Pick up Object from Floor Able to pick up shoe, needs supervision    From Standing Position, Turn to Look Behind Over each Shoulder Turn sideways only but maintains balance    Turn 360 Degrees Needs assistance while turning    Standing Unsupported, Alternately Place Feet on Step/Stool Able to complete >2 steps/needs minimal assist    Standing Unsupported, One Foot in Front Needs help to step but can hold 15 seconds    Standing on One Leg Unable to try or needs assist to prevent fall    Total Score 28      Timed Up and Go Test   TUG Normal TUG    Normal TUG (seconds) 55.44   with use of RW                   PT Education - 08/06/20 1404     Education Details progress toward STGs    Person(s) Educated Patient    Methods Explanation    Comprehension Verbalized understanding              PT Short Term Goals - 08/06/20 1409       PT SHORT TERM GOAL #1   Title Patient will be independent with initial HEP focused on balance/strength (All STGs Due: 08/02/20)    Baseline no HEP established    Time 4    Period Weeks    Status New    Target Date 08/02/20       PT SHORT TERM GOAL #2   Title Patient will improve TUG to </= 35 seconds to demonstrate improved balance and functional mobility    Baseline 45.81 secs; 55.44 seconds on 7/26    Time 4    Period Weeks    Status  Not Met      PT SHORT TERM GOAL #3   Title Patient will improve Berg Balance to >/= 30/56 to demonstrate improved balance    Baseline 25/56; 28/56    Time 4    Period Weeks    Status Not Met      PT SHORT TERM GOAL #4   Title Gait Speed to be assessed and STG/LTG to be updated as appropriate; 07/09/20 Goal is 0.25 m/s    Baseline TBA; 07/09/20 gait speed 0.18 m/s; 7/26 0.27 m/s    Time 4    Period Weeks    Status Achieved      PT SHORT TERM GOAL #5   Title Patient will verbalize understanding of fall prevention to promote safety within the home/community    Baseline dependent    Time 4    Period Weeks    Status New               PT Long Term Goals - 07/01/20 1755       PT LONG TERM GOAL #1   Title Patient will be independent with final HEP focused on balance/strength (All LTGs Due: 08/30/20)    Baseline no HEP established    Time 8    Period Weeks    Status New    Target Date 08/30/20      PT LONG TERM GOAL #2   Title Patient will improve 5x sit <> stand to </= 15 seconds with UE support to demo improved balance    Baseline 19.82 secs    Time 8    Period Weeks    Status New      PT LONG TERM GOAL #3   Title Patient will improve TUG to </= 25 seconds with LRAD to demo improved balance    Baseline 45.81 secs    Time 8    Period Weeks    Status New      PT LONG TERM GOAL #4   Title Patient will improve Berg Balance to >/= 40/56 to demonstrate improved balance and reduced fall risk    Baseline 25/56    Time 8    Period Weeks    Status New      PT LONG TERM GOAL #5   Title LTG to be set for Gait Speed    Baseline TBA    Time 8    Period Weeks    Status New      Additional Long Term Goals   Additional Long Term Goals Yes      PT LONG TERM  GOAL #6   Title Patient will be able to ambulate >/= 400 ft with LRAD and Mod I to demonstrate improved household mobility/community mobility    Baseline 75    Time 8    Period Weeks    Status New                   Plan - 08/06/20 1838     Clinical Impression Statement Began assesment of patient's progress toward STG. Patient demonstrating some improvements in balance with Berg Balance score of 28/56 and gait speed at 0.27 m/s. Both still indicative of high fall risk at this time, but has demo progress but not to goal level. Patient has demonstrated progress but slow nad minimal. Continue to work on improved gait pattern to promote improved functional mobility and reduced fall risk. Unable to assess STG 1 and 5 due to no internet access/power at end  of session. WIll continue to benefit from skilled PT services to progress toward all LTGs.    Personal Factors and Comorbidities Comorbidity 3+;Transportation;Time since onset of injury/illness/exacerbation    Comorbidities CHF, A-Fib, Arthritis, CAD, SKD, DM Type 2, GERD, CVA, HTN, HLD, ICH    Examination-Activity Limitations Bend;Stairs;Stand;Locomotion Level;Transfers    Examination-Participation Restrictions Driving;Community Activity;Cleaning    Rehab Potential Good    PT Frequency 2x / week    PT Duration 8 weeks    PT Treatment/Interventions ADLs/Self Care Home Management;Aquatic Therapy;Cryotherapy;Electrical Stimulation;Moist Heat;DME Instruction;Gait training;Stair training;Functional mobility training;Therapeutic activities;Therapeutic exercise;Balance training;Neuromuscular re-education;Patient/family education;Orthotic Fit/Training;Manual techniques;Passive range of motion;Dry needling;Joint Manipulations    PT Next Visit Plan Finish checking STG, review HEP and fall prevention. continue Scifit, break down gait pattern into individual tasks and assess carryover, encourage a narroe BOS with activity    PT Home Exercise Plan  [phone]             Patient will benefit from skilled therapeutic intervention in order to improve the following deficits and impairments:  Abnormal gait, Decreased balance, Decreased mobility, Difficulty walking, Postural dysfunction, Decreased strength, Decreased coordination, Decreased activity tolerance, Decreased knowledge of use of DME, Impaired tone, Impaired sensation  Visit Diagnosis: Other abnormalities of gait and mobility  Difficulty in walking, not elsewhere classified  Muscle weakness (generalized)  Unsteadiness on feet     Problem List Patient Active Problem List   Diagnosis Date Noted   Chronic diastolic CHF (congestive heart failure) ([place]) 10/28/2017   History of cerebrovascular accident (CVA) with residual deficit 05/04/2017   CKD (chronic kidney disease) stage 3, GFR 30-59 ml/min ([place]) 05/04/2017   GERD (gastroesophageal reflux disease) 02/08/2015   HTN (hypertension) 07/01/2012   ICH (intracerebral hemorrhage) ([place]) 12/22/2010   Hyperlipemia 11/21/2010   Gout 11/20/2010   CAD (coronary artery disease) 11/20/2010   Stroke ([place]) 11/19/2010   Diabetes type 2, controlled ([place]) 11/19/2010   Permanent atrial fibrillation 11/19/2010    Jones Bales, PT, DPT 08/06/2020, 6:41 PM  [facility] [address] [place], [place], [address] Phone: [phone]   Fax:  [phone]  Name: [name] MRN: [id number] Date of Birth: September 22, 1955

## 2020-08-08 ENCOUNTER — Other Ambulatory Visit: Payer: Self-pay

## 2020-08-08 ENCOUNTER — Ambulatory Visit: Payer: Medicare Other

## 2020-08-08 DIAGNOSIS — R262 Difficulty in walking, not elsewhere classified: Secondary | ICD-10-CM

## 2020-08-08 DIAGNOSIS — R2689 Other abnormalities of gait and mobility: Secondary | ICD-10-CM | POA: Diagnosis not present

## 2020-08-08 DIAGNOSIS — M6281 Muscle weakness (generalized): Secondary | ICD-10-CM

## 2020-08-08 DIAGNOSIS — R2681 Unsteadiness on feet: Secondary | ICD-10-CM

## 2020-08-08 NOTE — Therapy (Signed)
[facility] [address] [place], [place], [address] Phone: [phone]   Fax:  [phone]  Physical Therapy Treatment  Patient Details  Name: [name] MRN: [id number] Date of Birth: [birth year] Referring Provider (PT): Rochel Brome, MD   Encounter Date: 08/08/2020   PT End of Session - 08/08/20 1447     Visit Number 10    Number of Visits 17    Date for PT Re-Evaluation 08/30/20    Authorization Type UHC Medicare (10th Visit PN)    Progress Note Due on Visit 19   progress note completd on 9th visit   PT Start Time 1447    PT Stop Time 1530    PT Time Calculation (min) 43 min    Equipment Utilized During Treatment Gait belt    Activity Tolerance Patient tolerated treatment well    Behavior During Therapy [facility] for tasks assessed/performed             Past Medical History:  Diagnosis Date   A-fib ([place]) 11/19/2010   Acute exacerbation of congestive heart failure ([place]) 11/19/2010   Arthritis    CAD (coronary artery disease) 11/20/2010   CHF (congestive heart failure) ([place])    CKD (chronic kidney disease) stage 3, GFR 30-59 ml/min ([place]) 05/04/2017   Coronary artery disease    Diabetes mellitus    Diabetes mellitus 11/19/2010   Diabetes type 2, controlled ([place]) 11/19/2010   GERD (gastroesophageal reflux disease) 02/08/2015   Gout    Gout 11/20/2010   History of cerebrovascular accident (CVA) with residual deficit 05/04/2017   HTN (hypertension) 07/01/2012   Hyperlipemia 11/21/2010   Hypertension    Hypertensive emergency 11/19/2010   ICH (intracerebral hemorrhage) ([place]) 12/22/2010   Physical deconditioning 12/22/2010   Pulmonary edema 11/19/2010   Respiratory failure ([place]) 11/19/2010   Shortness of breath    Stroke ([place]) 11/19/2010   Thyroiditis 11/20/2010    Past Surgical History:  Procedure Laterality Date   PEG PLACEMENT  12/03/2010   Procedure: PERCUTANEOUS ENDOSCOPIC GASTROSTOMY (PEG) PLACEMENT;   Surgeon: Lafayette Dragon, MD;  [facility] ENDOSCOPY;  Service: Endoscopy;  Laterality: N/A;   TRACHEOSTOMY TUBE PLACEMENT  11/28/2010   Procedure: TRACHEOSTOMY;  Surgeon: Beckie Salts, MD;  Location: [facility];  Service: ENT;  Laterality: N/A;    There were no vitals filed for this visit.   Subjective Assessment - 08/08/20 1451     Subjective Patient denies new changes. Walked into today's session with RW. No falls to report.    Pertinent History CHF, A-Fib, Arthritis, CAD, SKD, DM Type 2, GERD, CVA, HTN, HLD, ICH    Limitations Standing;Walking;House hold activities    How long can you walk comfortably? 5 minutes    Patient Stated Goals Improve Mobility    Currently in Pain? No/denies                [facility] Adult PT Treatment/Exercise - 08/08/20 0001       Transfers   Transfers Sit to Stand;Stand to Sit    Sit to Stand 4: Min guard    Stand to Sit 4: Min guard      Ambulation/Gait   Ambulation/Gait Yes    Ambulation/Gait Assistance 4: Min guard    Ambulation/Gait Assistance Details into/out of therapy session with RW    Assistive device Rolling walker    Gait Pattern Step-to pattern;Decreased arm swing - right;Decreased arm swing - left;Decreased step length - right;Decreased step length -  left;Decreased hip/knee flexion - right;Decreased hip/knee flexion - left;Wide base of support;Poor foot clearance - right;Poor foot clearance - left    Ambulation Surface Level;Indoor      Therapeutic Activites    Therapeutic Activities Other Therapeutic Activities    Other Therapeutic Activities Educated on Fall Prevention; Handout reviewed and provided (see medbridge)      Exercises   Exercises Other Exercises    Other Exercises  Completed entire review of HEP and progressed to patient's tolerance.            Completed all of the following exercises as review and progressed to patient's tolerance. Bolded are new additions/updates Access Code: [email or web address] URL:  [email or web address] Date: 08/08/2020 Prepared by: Baldomero Lamy  Exercises Supine Bridge with Resistance Band - 1 x daily - 7 x weekly - 2 sets - 10 reps - 3 seconds hold - add red tband Clamshell with Resistance - 1 x daily - 7 x weekly - 2 sets - 10 reps - add green tband Supine March with Resistance Band - 1 x daily - 7 x weekly - 2 sets - 10 reps - add red tband Supine Lower Trunk Rotation - 1 x daily - 7 x weekly - 2 sets - 10 reps Standing with Head Nod - 1 x daily - 7 x weekly - 1 sets - 10 reps Standing Balance with Eyes Closed - 1 x daily - 7 x weekly - 1 sets - 3 reps - 30 seconds hold Romberg Stance - 1 x daily - 7 x weekly - 1 sets - 3 reps - 30 seconds hold Side to Side Weight Shift with Counter Support - 1 x daily - 7 x weekly - 3 sets - 10 reps  Patient Education Falls at [place]    PT Education - 08/08/20 1451     Education Details Fall Prevention; Updated HEP    Person(s) Educated Patient    Methods Explanation;Demonstration;Handout    Comprehension Verbalized understanding;Returned demonstration              PT Short Term Goals - 08/08/20 1517       PT SHORT TERM GOAL #1   Title Patient will be independent with initial HEP focused on balance/strength (All STGs Due: 08/02/20)    Baseline reports independence, completing daily    Time 4    Period Weeks    Status Achieved    Target Date 08/02/20      PT SHORT TERM GOAL #2   Title Patient will improve TUG to </= 35 seconds to demonstrate improved balance and functional mobility    Baseline 45.81 secs; 55.44 seconds on 7/26    Time 4    Period Weeks    Status Not Met      PT SHORT TERM GOAL #3   Title Patient will improve Berg Balance to >/= 30/56 to demonstrate improved balance    Baseline 25/56; 28/56    Time 4    Period Weeks    Status Not Met      PT SHORT TERM GOAL #4   Title Gait Speed to be assessed and STG/LTG to be updated as appropriate; 07/09/20 Goal is 0.25 m/s     Baseline TBA; 07/09/20 gait speed 0.18 m/s; 7/26 0.27 m/s    Time 4    Period Weeks    Status Achieved      PT SHORT TERM GOAL #5   Title Patient will verbalize understanding of fall prevention to promote  safety within the home/community    Baseline dependent; verbalized understanding    Time 4    Period Weeks    Status Achieved               PT Long Term Goals - 07/01/20 1755       PT LONG TERM GOAL #1   Title Patient will be independent with final HEP focused on balance/strength (All LTGs Due: 08/30/20)    Baseline no HEP established    Time 8    Period Weeks    Status New    Target Date 08/30/20      PT LONG TERM GOAL #2   Title Patient will improve 5x sit <> stand to </= 15 seconds with UE support to demo improved balance    Baseline 19.82 secs    Time 8    Period Weeks    Status New      PT LONG TERM GOAL #3   Title Patient will improve TUG to </= 25 seconds with LRAD to demo improved balance    Baseline 45.81 secs    Time 8    Period Weeks    Status New      PT LONG TERM GOAL #4   Title Patient will improve Berg Balance to >/= 40/56 to demonstrate improved balance and reduced fall risk    Baseline 25/56    Time 8    Period Weeks    Status New      PT LONG TERM GOAL #5   Title LTG to be set for Gait Speed    Baseline TBA    Time 8    Period Weeks    Status New      Additional Long Term Goals   Additional Long Term Goals Yes      PT LONG TERM GOAL #6   Title Patient will be able to ambulate >/= 400 ft with LRAD and Mod I to demonstrate improved household mobility/community mobility    Baseline 75    Time 8    Period Weeks    Status New                   Plan - 08/08/20 1452     Clinical Impression Statement Today's skilled PT session included finishing assessment of patient's progress toward STGs, patient able to meet STG #1 and 5 today. PT educating on fall prevention and reviewing handout with patient to promote improved safety  within the home. Rest of session spent reviewing patient's current HEP and progressing to patient's tolerance. Will continue to progress toward all LTGs.    Personal Factors and Comorbidities Comorbidity 3+;Transportation;Time since onset of injury/illness/exacerbation    Comorbidities CHF, A-Fib, Arthritis, CAD, SKD, DM Type 2, GERD, CVA, HTN, HLD, ICH    Examination-Activity Limitations Bend;Stairs;Stand;Locomotion Level;Transfers    Examination-Participation Restrictions Driving;Community Activity;Cleaning    Rehab Potential Good    PT Frequency 2x / week    PT Duration 8 weeks    PT Treatment/Interventions ADLs/Self Care Home Management;Aquatic Therapy;Cryotherapy;Electrical Stimulation;Moist Heat;DME Instruction;Gait training;Stair training;Functional mobility training;Therapeutic activities;Therapeutic exercise;Balance training;Neuromuscular re-education;Patient/family education;Orthotic Fit/Training;Manual techniques;Passive range of motion;Dry needling;Joint Manipulations    PT Next Visit Plan continue Scifit, break down gait pattern into individual tasks and assess carryover, encourage a narroe BOS with activity    PT Home Exercise Plan [email or web address]             Patient will benefit from skilled therapeutic intervention in order to improve the  following deficits and impairments:  Abnormal gait, Decreased balance, Decreased mobility, Difficulty walking, Postural dysfunction, Decreased strength, Decreased coordination, Decreased activity tolerance, Decreased knowledge of use of DME, Impaired tone, Impaired sensation  Visit Diagnosis: Other abnormalities of gait and mobility  Muscle weakness (generalized)  Unsteadiness on feet  Difficulty in walking, not elsewhere classified     Problem List Patient Active Problem List   Diagnosis Date Noted   Chronic diastolic CHF (congestive heart failure) ([place]) 10/28/2017   History of cerebrovascular accident (CVA) with residual deficit  05/04/2017   CKD (chronic kidney disease) stage 3, GFR 30-59 ml/min ([place]) 05/04/2017   GERD (gastroesophageal reflux disease) 02/08/2015   HTN (hypertension) 07/01/2012   ICH (intracerebral hemorrhage) ([place]) 12/22/2010   Hyperlipemia 11/21/2010   Gout 11/20/2010   CAD (coronary artery disease) 11/20/2010   Stroke ([place]) 11/19/2010   Diabetes type 2, controlled ([place]) 11/19/2010   Permanent atrial fibrillation 11/19/2010    Jones Bales, PT, DPT 08/08/2020, 3:32 PM  [facility] [address] [place], [place], [address] Phone: [phone]   Fax:  [phone]  Name: [name] MRN: [id number] Date of Birth: 06-10-1955

## 2020-08-08 NOTE — Patient Instructions (Signed)
Access Code: [email or web address] URL: [email or web address] Date: 08/08/2020 Prepared by: Baldomero Lamy  Exercises Supine Bridge with Resistance Band - 1 x daily - 7 x weekly - 2 sets - 10 reps - 3 seconds hold Clamshell with Resistance - 1 x daily - 7 x weekly - 2 sets - 10 reps Supine March with Resistance Band - 1 x daily - 7 x weekly - 2 sets - 10 reps Supine Lower Trunk Rotation - 1 x daily - 7 x weekly - 2 sets - 10 reps Standing with Head Nod - 1 x daily - 7 x weekly - 1 sets - 10 reps Standing Balance with Eyes Closed - 1 x daily - 7 x weekly - 1 sets - 3 reps - 30 seconds hold Romberg Stance - 1 x daily - 7 x weekly - 1 sets - 3 reps - 30 seconds hold Side to Side Weight Shift with Counter Support - 1 x daily - 7 x weekly - 3 sets - 10 reps  Patient Education Falls at [place]

## 2020-08-13 ENCOUNTER — Other Ambulatory Visit: Payer: Self-pay

## 2020-08-13 ENCOUNTER — Ambulatory Visit: Payer: Medicare Other | Attending: Family Medicine

## 2020-08-13 DIAGNOSIS — M6281 Muscle weakness (generalized): Secondary | ICD-10-CM | POA: Insufficient documentation

## 2020-08-13 DIAGNOSIS — R2681 Unsteadiness on feet: Secondary | ICD-10-CM | POA: Insufficient documentation

## 2020-08-13 DIAGNOSIS — R262 Difficulty in walking, not elsewhere classified: Secondary | ICD-10-CM | POA: Diagnosis present

## 2020-08-13 DIAGNOSIS — R2689 Other abnormalities of gait and mobility: Secondary | ICD-10-CM | POA: Diagnosis present

## 2020-08-13 DIAGNOSIS — R278 Other lack of coordination: Secondary | ICD-10-CM | POA: Diagnosis present

## 2020-08-13 NOTE — Therapy (Signed)
[facility] [address] [place], [place], [address] Phone: [phone]   Fax:  [phone]  Physical Therapy Treatment  Patient Details  Name: [name] MRN: [id number] Date of Birth: Feb 08, 1955 Referring Provider (PT): Rochel Brome, MD   Encounter Date: 08/13/2020   PT End of Session - 08/13/20 0017     Visit Number 11    Number of Visits 17    Date for PT Re-Evaluation 08/30/20    Authorization Type UHC Medicare (10th Visit PN)    Progress Note Due on Visit 19   progress note completd on 9th visit   PT Start Time 1353    PT Stop Time 1438    PT Time Calculation (min) 45 min    Equipment Utilized During Treatment Gait belt    Activity Tolerance Patient tolerated treatment well    Behavior During Therapy [facility] for tasks assessed/performed             Past Medical History:  Diagnosis Date   A-fib ([place]) 11/19/2010   Acute exacerbation of congestive heart failure ([place]) 11/19/2010   Arthritis    CAD (coronary artery disease) 11/20/2010   CHF (congestive heart failure) ([place])    CKD (chronic kidney disease) stage 3, GFR 30-59 ml/min ([place]) 05/04/2017   Coronary artery disease    Diabetes mellitus    Diabetes mellitus 11/19/2010   Diabetes type 2, controlled ([place]) 11/19/2010   GERD (gastroesophageal reflux disease) 02/08/2015   Gout    Gout 11/20/2010   History of cerebrovascular accident (CVA) with residual deficit 05/04/2017   HTN (hypertension) 07/01/2012   Hyperlipemia 11/21/2010   Hypertension    Hypertensive emergency 11/19/2010   ICH (intracerebral hemorrhage) ([place]) 12/22/2010   Physical deconditioning 12/22/2010   Pulmonary edema 11/19/2010   Respiratory failure ([place]) 11/19/2010   Shortness of breath    Stroke ([place]) 11/19/2010   Thyroiditis 11/20/2010    Past Surgical History:  Procedure Laterality Date   PEG PLACEMENT  12/03/2010   Procedure: PERCUTANEOUS ENDOSCOPIC GASTROSTOMY (PEG) PLACEMENT;   Surgeon: Lafayette Dragon, MD;  Location: [facility] ENDOSCOPY;  Service: Endoscopy;  Laterality: N/A;   TRACHEOSTOMY TUBE PLACEMENT  11/28/2010   Procedure: TRACHEOSTOMY;  Surgeon: Beckie Salts, MD;  Location: [facility];  Service: ENT;  Laterality: N/A;    There were no vitals filed for this visit.   Subjective Assessment - 08/13/20 1358     Subjective No new changes. Reports that the changes to exercises went well. No falls.    Pertinent History CHF, A-Fib, Arthritis, CAD, SKD, DM Type 2, GERD, CVA, HTN, HLD, ICH    Limitations Standing;Walking;House hold activities    How long can you walk comfortably? 5 minutes    Patient Stated Goals Improve Mobility    Currently in Pain? No/denies              [facility] Adult PT Treatment/Exercise - 08/13/20 0001       Transfers   Transfers Sit to Stand;Stand to Sit    Sit to Stand 4: Min guard    Stand to Sit 4: Min guard      Ambulation/Gait   Ambulation/Gait Yes    Ambulation/Gait Assistance 4: Min guard    Ambulation/Gait Assistance Details completed gait with RW around therapy session and in/out of session. continue to provide cues for improved knee flexion and more narrow BOS    Assistive device Rolling walker    Gait Pattern Step-to pattern;Decreased arm  swing - right;Decreased arm swing - left;Decreased step length - right;Decreased step length - left;Decreased hip/knee flexion - right;Decreased hip/knee flexion - left;Wide base of support;Poor foot clearance - right;Poor foot clearance - left    Ambulation Surface Level;Indoor      Neuro Re-ed    Neuro Re-ed Details  Completed standing balance on firm surface with bean bag toss, completed x 10 toss with feet hip width, then second set progressed to narrow BOS x 10 reps. CGA required with narrow BOS, intermittent touch to chair placed on R side.      Exercises   Exercises Knee/Hip      Knee/Hip Exercises: Aerobic   Other Aerobic Completed SciFit on Level 2.5 with BLE/BUE x 8 minutes for  improved reciprocal motion/strengthening and activity tolerance. Patient tolerating increase in resistance well.      Knee/Hip Exercises: Seated   Long Arc Quad Both;1 set;15 reps;Limitations    Long Arc Quad Weight 3 lbs.    Long Arc Quad Limitations alternaitng BLE; cues for control    Marching Strengthening;Both;1 set;Weights;Limitations    Marching Limitations alternating seated march with 3 sec isometric hold x 10 reps bilat with green theraband    Hamstring Curl Strengthening;Both;2 sets;10 reps;Limitations    Hamstring Limitations seated with green theraband, cues for eccentric control.                 Balance Exercises - 08/13/20 0001       Balance Exercises: Standing   Standing Eyes Opened Wide (BOA);Head turns   vertical head turns x 10 reps   Standing Eyes Closed Wide (BOA);Solid surface;2 reps;30 secs;Limitations    SLS with Vectors Solid surface;Upper extremity assist 2;Limitations    SLS with Vectors Limitations with chair placed on R side and light UE support via R UE, completed alternating toe taps to 4" step x 10 reps bilat. CGA - Min A required, especially with tap with LLE due to imbalance.    Marching Upper extremity assist 2;Static;Limitations    Marching Limitations completed static marching with use of BUE from RW; cues for hip/knee flexion. Also PT educating to keep soft bend in UE to push through RW vs. pushing out. CGA required, completed x 10 reps bilat.                 PT Short Term Goals - 08/08/20 1517       PT SHORT TERM GOAL #1   Title Patient will be independent with initial HEP focused on balance/strength (All STGs Due: 08/02/20)    Baseline reports independence, completing daily    Time 4    Period Weeks    Status Achieved    Target Date 08/02/20      PT SHORT TERM GOAL #2   Title Patient will improve TUG to </= 35 seconds to demonstrate improved balance and functional mobility    Baseline 45.81 secs; 55.44 seconds on 7/26     Time 4    Period Weeks    Status Not Met      PT SHORT TERM GOAL #3   Title Patient will improve Berg Balance to >/= 30/56 to demonstrate improved balance    Baseline 25/56; 28/56    Time 4    Period Weeks    Status Not Met      PT SHORT TERM GOAL #4   Title Gait Speed to be assessed and STG/LTG to be updated as appropriate; 07/09/20 Goal is 0.25 m/s  Baseline TBA; 07/09/20 gait speed 0.18 m/s; 7/26 0.27 m/s    Time 4    Period Weeks    Status Achieved      PT SHORT TERM GOAL #5   Title Patient will verbalize understanding of fall prevention to promote safety within the home/community    Baseline dependent; verbalized understanding    Time 4    Period Weeks    Status Achieved               PT Long Term Goals - 07/01/20 1755       PT LONG TERM GOAL #1   Title Patient will be independent with final HEP focused on balance/strength (All LTGs Due: 08/30/20)    Baseline no HEP established    Time 8    Period Weeks    Status New    Target Date 08/30/20      PT LONG TERM GOAL #2   Title Patient will improve 5x sit <> stand to </= 15 seconds with UE support to demo improved balance    Baseline 19.82 secs    Time 8    Period Weeks    Status New      PT LONG TERM GOAL #3   Title Patient will improve TUG to </= 25 seconds with LRAD to demo improved balance    Baseline 45.81 secs    Time 8    Period Weeks    Status New      PT LONG TERM GOAL #4   Title Patient will improve Berg Balance to >/= 40/56 to demonstrate improved balance and reduced fall risk    Baseline 25/56    Time 8    Period Weeks    Status New      PT LONG TERM GOAL #5   Title LTG to be set for Gait Speed    Baseline TBA    Time 8    Period Weeks    Status New      Additional Long Term Goals   Additional Long Term Goals Yes      PT LONG TERM GOAL #6   Title Patient will be able to ambulate >/= 400 ft with LRAD and Mod I to demonstrate improved household mobility/community mobility     Baseline 75    Time 8    Period Weeks    Status New                   Plan - 08/13/20 1401     Clinical Impression Statement Continued activites focused on functional strengthening and balance. Continued progression of working toward patient balance with more narrow BOS. Continue to require seated rest breaks due to fatigue, but overall tolerating activities well. Will continue per POC.    Personal Factors and Comorbidities Comorbidity 3+;Transportation;Time since onset of injury/illness/exacerbation    Comorbidities CHF, A-Fib, Arthritis, CAD, SKD, DM Type 2, GERD, CVA, HTN, HLD, ICH    Examination-Activity Limitations Bend;Stairs;Stand;Locomotion Level;Transfers    Examination-Participation Restrictions Driving;Community Activity;Cleaning    Rehab Potential Good    PT Frequency 2x / week    PT Duration 8 weeks    PT Treatment/Interventions ADLs/Self Care Home Management;Aquatic Therapy;Cryotherapy;Electrical Stimulation;Moist Heat;DME Instruction;Gait training;Stair training;Functional mobility training;Therapeutic activities;Therapeutic exercise;Balance training;Neuromuscular re-education;Patient/family education;Orthotic Fit/Training;Manual techniques;Passive range of motion;Dry needling;Joint Manipulations    PT Next Visit Plan continue Scifit, break down gait pattern into individual tasks and assess carryover, encourage a narroe BOS with activity. balance activities. stepping strategies.  PT Home Exercise Plan [phone]             Patient will benefit from skilled therapeutic intervention in order to improve the following deficits and impairments:  Abnormal gait, Decreased balance, Decreased mobility, Difficulty walking, Postural dysfunction, Decreased strength, Decreased coordination, Decreased activity tolerance, Decreased knowledge of use of DME, Impaired tone, Impaired sensation  Visit Diagnosis: Other abnormalities of gait and mobility  Unsteadiness on  feet  Muscle weakness (generalized)  Difficulty in walking, not elsewhere classified     Problem List Patient Active Problem List   Diagnosis Date Noted   Chronic diastolic CHF (congestive heart failure) ([place]) 10/28/2017   History of cerebrovascular accident (CVA) with residual deficit 05/04/2017   CKD (chronic kidney disease) stage 3, GFR 30-59 ml/min ([facility]) 05/04/2017   GERD (gastroesophageal reflux disease) 02/08/2015   HTN (hypertension) 07/01/2012   ICH (intracerebral hemorrhage) ([place]) 12/22/2010   Hyperlipemia 11/21/2010   Gout 11/20/2010   CAD (coronary artery disease) 11/20/2010   Stroke ([place]) 11/19/2010   Diabetes type 2, controlled ([place]) 11/19/2010   Permanent atrial fibrillation 11/19/2010    Jones Bales, PT, DPT 08/13/2020, 2:39 PM  [facility] [address] [place], [place], [address] Phone: [phone]   Fax:  [phone]  Name: [name] MRN: [id number] Date of Birth: 1955/01/27

## 2020-08-14 ENCOUNTER — Other Ambulatory Visit: Payer: Self-pay | Admitting: Cardiovascular Disease

## 2020-08-14 DIAGNOSIS — I482 Chronic atrial fibrillation, unspecified: Secondary | ICD-10-CM

## 2020-08-14 NOTE — Telephone Encounter (Signed)
Prescription refill request for Eliquis received. Indication: Afib Last office visit: 11/30/54 ([place]) Scr: 1.63 (12/12/19) Age: 65 Weight: 105.1kg

## 2020-08-15 ENCOUNTER — Ambulatory Visit: Payer: Medicare Other

## 2020-08-15 ENCOUNTER — Other Ambulatory Visit: Payer: Self-pay

## 2020-08-15 DIAGNOSIS — R262 Difficulty in walking, not elsewhere classified: Secondary | ICD-10-CM

## 2020-08-15 DIAGNOSIS — R2681 Unsteadiness on feet: Secondary | ICD-10-CM

## 2020-08-15 DIAGNOSIS — R2689 Other abnormalities of gait and mobility: Secondary | ICD-10-CM | POA: Diagnosis not present

## 2020-08-15 DIAGNOSIS — R278 Other lack of coordination: Secondary | ICD-10-CM

## 2020-08-15 DIAGNOSIS — M6281 Muscle weakness (generalized): Secondary | ICD-10-CM

## 2020-08-15 NOTE — Therapy (Signed)
[facility] [address] [place], [place], [address] Phone: [phone]   Fax:  [phone]  Physical Therapy Treatment  Patient Details  Name: [name] MRN: [id number] Date of Birth: 1955/08/04 Referring Provider (PT): Rochel Brome, MD   Encounter Date: 08/15/2020   PT End of Session - 08/15/20 1452     Visit Number 12    Number of Visits 17    Date for PT Re-Evaluation 08/30/20    Authorization Type UHC Medicare (10th Visit PN)    Progress Note Due on Visit 19   progress note completd on 9th visit   PT Start Time 1445    PT Stop Time 1529    PT Time Calculation (min) 44 min    Equipment Utilized During Treatment Gait belt    Activity Tolerance Patient tolerated treatment well    Behavior During Therapy [facility] for tasks assessed/performed             Past Medical History:  Diagnosis Date   A-fib ([place]) 11/19/2010   Acute exacerbation of congestive heart failure ([place]) 11/19/2010   Arthritis    CAD (coronary artery disease) 11/20/2010   CHF (congestive heart failure) ([place])    CKD (chronic kidney disease) stage 3, GFR 30-59 ml/min ([place]) 05/04/2017   Coronary artery disease    Diabetes mellitus    Diabetes mellitus 11/19/2010   Diabetes type 2, controlled ([place]) 11/19/2010   GERD (gastroesophageal reflux disease) 02/08/2015   Gout    Gout 11/20/2010   History of cerebrovascular accident (CVA) with residual deficit 05/04/2017   HTN (hypertension) 07/01/2012   Hyperlipemia 11/21/2010   Hypertension    Hypertensive emergency 11/19/2010   ICH (intracerebral hemorrhage) ([place]) 12/22/2010   Physical deconditioning 12/22/2010   Pulmonary edema 11/19/2010   Respiratory failure ([place]) 11/19/2010   Shortness of breath    Stroke ([place]) 11/19/2010   Thyroiditis 11/20/2010    Past Surgical History:  Procedure Laterality Date   PEG PLACEMENT  12/03/2010   Procedure: PERCUTANEOUS ENDOSCOPIC GASTROSTOMY (PEG) PLACEMENT;   Surgeon: Lafayette Dragon, MD;  Location: [facility] ENDOSCOPY;  Service: Endoscopy;  Laterality: N/A;   TRACHEOSTOMY TUBE PLACEMENT  11/28/2010   Procedure: TRACHEOSTOMY;  Surgeon: Beckie Salts, MD;  Location: [facility];  Service: ENT;  Laterality: N/A;    There were no vitals filed for this visit.   Subjective Assessment - 08/15/20 1452     Subjective No new changes. No Pain or Falls.    Pertinent History CHF, A-Fib, Arthritis, CAD, SKD, DM Type 2, GERD, CVA, HTN, HLD, ICH    Limitations Standing;Walking;House hold activities    How long can you walk comfortably? 5 minutes    Patient Stated Goals Improve Mobility    Currently in Pain? No/denies                [facility] Adult PT Treatment/Exercise - 08/15/20 0001       Transfers   Transfers Sit to Stand;Stand to Sit    Sit to Stand 4: Min guard    Stand to Sit 4: Min guard    Number of Reps 10 reps;1 set    Comments completed sit <> standing from mat with BUE support, cues for hip width BOS and cues for forward lean.      Ambulation/Gait   Ambulation/Gait Yes    Ambulation/Gait Assistance 4: Min guard    Ambulation/Gait Assistance Details ambulation with RW into/out of therapy session and aorund therapy  gym with activities    Ambulation Distance (Feet) --   clinic distances   Assistive device Rolling walker    Gait Pattern Step-to pattern;Decreased arm swing - right;Decreased arm swing - left;Decreased step length - right;Decreased step length - left;Decreased hip/knee flexion - right;Decreased hip/knee flexion - left;Wide base of support;Poor foot clearance - right;Poor foot clearance - left    Ambulation Surface Level;Indoor      Neuro Re-ed    Neuro Re-ed Details  In // bars: completed L<>R weight shift with alternating heel raise 2 x 10 reps, progressing from bilat hand holds to no UE support. CGA and cues for control required. With black beam and BUE support completed alternating steps over beam with LE with forward weight shift onto  leg and then backwards step back to starting position, completed x 10 reps bilat. Increased cues for proper technique.      Exercises   Exercises Knee/Hip;Other Exercises    Other Exercises  Completed heel raises with BUE support from // bars, completed 2 x 10 reps with Cues for slow and control with completion to promote improved strengthening      Knee/Hip Exercises: Aerobic   Other Aerobic Completed SciFit on Level 3.5 with BLE/BUE x 6 minutes for improved reciprocal motion/strengthening and activity tolerance. Patient tolerating increase in resistance well.                 Balance Exercises - 08/15/20 0001       Balance Exercises: Standing   Standing Eyes Opened Narrow base of support (BOS);Solid surface;3 reps;30 secs;Limitations    Standing Eyes Opened Limitations with eyes open, intermittent CGA/touch A to // bars    SLS with Vectors Solid surface;Upper extremity assist 2;Upper extremity assist 1    SLS with Vectors Limitations in // bars and 6" step completed alternating toe taps bilar x 10 reps with BUE uspport, then second set x 10 reps progressed to single UE support. increased challenge cues for weight shift and using mirror for visual feedback to promote posture.    Marching Solid surface;Upper extremity assist 2    Marching Limitations with BUE support, walking marching in // bars, cues for step length. One instance of Min A to maintain balance.               PT Education - 08/15/20 1459     Education Details Calender of Appointments    Person(s) Educated Patient    Methods Explanation    Comprehension Verbalized understanding              PT Short Term Goals - 08/08/20 1517       PT SHORT TERM GOAL #1   Title Patient will be independent with initial HEP focused on balance/strength (All STGs Due: 08/02/20)    Baseline reports independence, completing daily    Time 4    Period Weeks    Status Achieved    Target Date 08/02/20      PT SHORT TERM  GOAL #2   Title Patient will improve TUG to </= 35 seconds to demonstrate improved balance and functional mobility    Baseline 45.81 secs; 55.44 seconds on 7/26    Time 4    Period Weeks    Status Not Met      PT SHORT TERM GOAL #3   Title Patient will improve Berg Balance to >/= 30/56 to demonstrate improved balance    Baseline 25/56; 28/56    Time 4  Period Weeks    Status Not Met      PT SHORT TERM GOAL #4   Title Gait Speed to be assessed and STG/LTG to be updated as appropriate; 07/09/20 Goal is 0.25 m/s    Baseline TBA; 07/09/20 gait speed 0.18 m/s; 7/26 0.27 m/s    Time 4    Period Weeks    Status Achieved      PT SHORT TERM GOAL #5   Title Patient will verbalize understanding of fall prevention to promote safety within the home/community    Baseline dependent; verbalized understanding    Time 4    Period Weeks    Status Achieved               PT Long Term Goals - 07/01/20 1755       PT LONG TERM GOAL #1   Title Patient will be independent with final HEP focused on balance/strength (All LTGs Due: 08/30/20)    Baseline no HEP established    Time 8    Period Weeks    Status New    Target Date 08/30/20      PT LONG TERM GOAL #2   Title Patient will improve 5x sit <> stand to </= 15 seconds with UE support to demo improved balance    Baseline 19.82 secs    Time 8    Period Weeks    Status New      PT LONG TERM GOAL #3   Title Patient will improve TUG to </= 25 seconds with LRAD to demo improved balance    Baseline 45.81 secs    Time 8    Period Weeks    Status New      PT LONG TERM GOAL #4   Title Patient will improve Berg Balance to >/= 40/56 to demonstrate improved balance and reduced fall risk    Baseline 25/56    Time 8    Period Weeks    Status New      PT LONG TERM GOAL #5   Title LTG to be set for Gait Speed    Baseline TBA    Time 8    Period Weeks    Status New      Additional Long Term Goals   Additional Long Term Goals Yes       PT LONG TERM GOAL #6   Title Patient will be able to ambulate >/= 400 ft with LRAD and Mod I to demonstrate improved household mobility/community mobility    Baseline 75    Time 8    Period Weeks    Status New                   Plan - 08/15/20 1455     Clinical Impression Statement Continued focus on functional strength and conitnued balance activiteis reducing UE support and promoting more equal BOS. Paitent tolerating well, but require continued verbal/tactile cues for technique. Will continue to progress toward all LTGs.    Personal Factors and Comorbidities Comorbidity 3+;Transportation;Time since onset of injury/illness/exacerbation    Comorbidities CHF, A-Fib, Arthritis, CAD, SKD, DM Type 2, GERD, CVA, HTN, HLD, ICH    Examination-Activity Limitations Bend;Stairs;Stand;Locomotion Level;Transfers    Examination-Participation Restrictions Driving;Community Activity;Cleaning    Rehab Potential Good    PT Frequency 2x / week    PT Duration 8 weeks    PT Treatment/Interventions ADLs/Self Care Home Management;Aquatic Therapy;Cryotherapy;Electrical Stimulation;Moist Heat;DME Instruction;Gait training;Stair training;Functional mobility training;Therapeutic activities;Therapeutic exercise;Balance training;Neuromuscular re-education;Patient/family education;Orthotic  Fit/Training;Manual techniques;Passive range of motion;Dry needling;Joint Manipulations    PT Next Visit Plan continue Scifit, break down gait pattern into individual tasks and assess carryover, encourage a narroe BOS with activity. balance activities. stepping strategies.    PT Home Exercise Plan [phone]             Patient will benefit from skilled therapeutic intervention in order to improve the following deficits and impairments:  Abnormal gait, Decreased balance, Decreased mobility, Difficulty walking, Postural dysfunction, Decreased strength, Decreased coordination, Decreased activity tolerance, Decreased  knowledge of use of DME, Impaired tone, Impaired sensation  Visit Diagnosis: Other abnormalities of gait and mobility  Unsteadiness on feet  Muscle weakness (generalized)  Difficulty in walking, not elsewhere classified  Other lack of coordination     Problem [name] Patient Active Problem [name]   Diagnosis Date Noted   Chronic diastolic CHF (congestive heart failure) ([place]) 10/28/2017   History of cerebrovascular accident (CVA) with residual deficit 05/04/2017   CKD (chronic kidney disease) stage 3, GFR 30-59 ml/min ([facility]) 05/04/2017   GERD (gastroesophageal reflux disease) 02/08/2015   HTN (hypertension) 07/01/2012   ICH (intracerebral hemorrhage) ([place]) 12/22/2010   Hyperlipemia 11/21/2010   Gout 11/20/2010   CAD (coronary artery disease) 11/20/2010   Stroke ([place]) 11/19/2010   Diabetes type 2, controlled ([place]) 11/19/2010   Permanent atrial fibrillation 11/19/2010    Jones Bales, PT, DPT 08/15/2020, 3:32 PM  [facility] [address] [place], [place], [address] Phone: [phone]   Fax:  [phone]  Name: JESPER STIREWALT MRN: [id number] Date of Birth: 1955-01-14

## 2020-08-19 ENCOUNTER — Other Ambulatory Visit: Payer: Self-pay | Admitting: Family Medicine

## 2020-08-20 ENCOUNTER — Other Ambulatory Visit: Payer: Self-pay | Admitting: Family Medicine

## 2020-08-20 ENCOUNTER — Ambulatory Visit: Payer: Medicare Other

## 2020-08-20 ENCOUNTER — Other Ambulatory Visit: Payer: Self-pay

## 2020-08-20 DIAGNOSIS — R2681 Unsteadiness on feet: Secondary | ICD-10-CM

## 2020-08-20 DIAGNOSIS — R2689 Other abnormalities of gait and mobility: Secondary | ICD-10-CM | POA: Diagnosis not present

## 2020-08-20 DIAGNOSIS — N400 Enlarged prostate without lower urinary tract symptoms: Secondary | ICD-10-CM

## 2020-08-20 DIAGNOSIS — R262 Difficulty in walking, not elsewhere classified: Secondary | ICD-10-CM

## 2020-08-20 DIAGNOSIS — R350 Frequency of micturition: Secondary | ICD-10-CM

## 2020-08-20 DIAGNOSIS — M6281 Muscle weakness (generalized): Secondary | ICD-10-CM

## 2020-08-20 DIAGNOSIS — R351 Nocturia: Secondary | ICD-10-CM

## 2020-08-20 NOTE — Therapy (Signed)
[facility] [address] [place], [place], [address] Phone: [phone]   Fax:  [phone]  Physical Therapy Treatment  Patient Details  Name: [name] MRN: [id number] Date of Birth: May 28, 1955 Referring Provider (PT): Rochel Brome, MD   Encounter Date: 08/20/2020   PT End of Session - 08/20/20 1407     Visit Number 13    Number of Visits 17    Date for PT Re-Evaluation 08/30/20    Authorization Type UHC Medicare (10th Visit PN)    Progress Note Due on Visit 19   progress note completd on 9th visit   PT Start Time 1401    PT Stop Time 1443    PT Time Calculation (min) 42 min    Equipment Utilized During Treatment Gait belt    Activity Tolerance Patient tolerated treatment well    Behavior During Therapy [facility] for tasks assessed/performed             Past Medical History:  Diagnosis Date   A-fib ([place]) 11/19/2010   Acute exacerbation of congestive heart failure ([place]) 11/19/2010   Arthritis    CAD (coronary artery disease) 11/20/2010   CHF (congestive heart failure) ([place])    CKD (chronic kidney disease) stage 3, GFR 30-59 ml/min ([place]) 05/04/2017   Coronary artery disease    Diabetes mellitus    Diabetes mellitus 11/19/2010   Diabetes type 2, controlled ([place]) 11/19/2010   GERD (gastroesophageal reflux disease) 02/08/2015   Gout    Gout 11/20/2010   History of cerebrovascular accident (CVA) with residual deficit 05/04/2017   HTN (hypertension) 07/01/2012   Hyperlipemia 11/21/2010   Hypertension    Hypertensive emergency 11/19/2010   ICH (intracerebral hemorrhage) ([place]) 12/22/2010   Physical deconditioning 12/22/2010   Pulmonary edema 11/19/2010   Respiratory failure ([place]) 11/19/2010   Shortness of breath    Stroke ([place]) 11/19/2010   Thyroiditis 11/20/2010    Past Surgical History:  Procedure Laterality Date   PEG PLACEMENT  12/03/2010   Procedure: PERCUTANEOUS ENDOSCOPIC GASTROSTOMY (PEG) PLACEMENT;   Surgeon: Lafayette Dragon, MD;  Location: [facility] ENDOSCOPY;  Service: Endoscopy;  Laterality: N/A;   TRACHEOSTOMY TUBE PLACEMENT  11/28/2010   Procedure: TRACHEOSTOMY;  Surgeon: Beckie Salts, MD;  Location: [facility];  Service: ENT;  Laterality: N/A;    There were no vitals filed for this visit.   Subjective Assessment - 08/20/20 1408     Subjective Nothing new. No pain or falls to report.    Pertinent History CHF, A-Fib, Arthritis, CAD, SKD, DM Type 2, GERD, CVA, HTN, HLD, ICH    Limitations Standing;Walking;House hold activities    How long can you walk comfortably? 5 minutes    Patient Stated Goals Improve Mobility    Currently in Pain? No/denies              [facility] Adult PT Treatment/Exercise - 08/20/20 0001       Transfers   Transfers Sit to Stand;Stand to Sit    Sit to Stand 4: Min guard    Stand to Sit 4: Min guard      Ambulation/Gait   Ambulation/Gait Yes    Ambulation/Gait Assistance 4: Min guard    Ambulation/Gait Assistance Details ambulation x 100 ft with RW, PT providing tactile cues at pelvis for weight shift and to promote step length. minimal carryover noted.    Ambulation Distance (Feet) 100 Feet    Assistive device Rolling walker    Gait Pattern  Step-to pattern;Decreased arm swing - right;Decreased arm swing - left;Decreased step length - right;Decreased step length - left;Decreased hip/knee flexion - right;Decreased hip/knee flexion - left;Wide base of support;Poor foot clearance - right;Poor foot clearance - left    Ambulation Surface Level;Indoor    Gait Comments completed gait in // bars iwth BUE supprot from bars working on improved step length and carryover into gait. Able to complete in // bars but challenge carryover with RW.      Neuro Re-ed    Neuro Re-ed Details  In // bars: completed steps forward with R/L x 10 reps to target working on improved step length bilat and slight knee flexion with completion to avoid maintain extension.      Exercises    Exercises Knee/Hip      Knee/Hip Exercises: Aerobic   Other Aerobic Completed SciFit on Level 3.5 with BLE/BUE x 7 minutes for improved reciprocal motion/strengthening and activity tolerance.                 Balance Exercises - 08/20/20 0001       Balance Exercises: Standing   SLS with Vectors Solid surface;Upper extremity assist 2;Upper extremity assist 1    SLS with Vectors Limitations standing with BUE support completed alternating toe taps to cones bilat x 10 reps, then second set progressed to single UE support on R side x 10 reps bilat. Patient having increased challenge reducing to single UE support. having to place in pocket to avoid using.    Retro Gait Upper extremity support;3 reps;Limitations    Retro Gait Limitations x 3 laps down and back in // bars with BUE support. Cues for step length and advancing hand placement.    Sidestepping Upper extremity support;5 reps;Limitations    Sidestepping Limitations completed side stepping x 3 laps in // bars, cues for step length and encouraging upright posture. cues to go back to narrow BOS.    Marching Solid surface;Upper extremity assist 2    Marching Limitations with BUE support, walking marching in // bars x 3 laps down and back, cues for step length as well as promoting hip/knee flexion as patient prefer extension.                 PT Short Term Goals - 08/08/20 1517       PT SHORT TERM GOAL #1   Title Patient will be independent with initial HEP focused on balance/strength (All STGs Due: 08/02/20)    Baseline reports independence, completing daily    Time 4    Period Weeks    Status Achieved    Target Date 08/02/20      PT SHORT TERM GOAL #2   Title Patient will improve TUG to </= 35 seconds to demonstrate improved balance and functional mobility    Baseline 45.81 secs; 55.44 seconds on 7/26    Time 4    Period Weeks    Status Not Met      PT SHORT TERM GOAL #3   Title Patient will improve Berg Balance to  >/= 30/56 to demonstrate improved balance    Baseline 25/56; 28/56    Time 4    Period Weeks    Status Not Met      PT SHORT TERM GOAL #4   Title Gait Speed to be assessed and STG/LTG to be updated as appropriate; 07/09/20 Goal is 0.25 m/s    Baseline TBA; 07/09/20 gait speed 0.18 m/s; 7/26 0.27 m/s    Time  4    Period Weeks    Status Achieved      PT SHORT TERM GOAL #5   Title Patient will verbalize understanding of fall prevention to promote safety within the home/community    Baseline dependent; verbalized understanding    Time 4    Period Weeks    Status Achieved               PT Long Term Goals - 07/01/20 1755       PT LONG TERM GOAL #1   Title Patient will be independent with final HEP focused on balance/strength (All LTGs Due: 08/30/20)    Baseline no HEP established    Time 8    Period Weeks    Status New    Target Date 08/30/20      PT LONG TERM GOAL #2   Title Patient will improve 5x sit <> stand to </= 15 seconds with UE support to demo improved balance    Baseline 19.82 secs    Time 8    Period Weeks    Status New      PT LONG TERM GOAL #3   Title Patient will improve TUG to </= 25 seconds with LRAD to demo improved balance    Baseline 45.81 secs    Time 8    Period Weeks    Status New      PT LONG TERM GOAL #4   Title Patient will improve Berg Balance to >/= 40/56 to demonstrate improved balance and reduced fall risk    Baseline 25/56    Time 8    Period Weeks    Status New      PT LONG TERM GOAL #5   Title LTG to be set for Gait Speed    Baseline TBA    Time 8    Period Weeks    Status New      Additional Long Term Goals   Additional Long Term Goals Yes      PT LONG TERM GOAL #6   Title Patient will be able to ambulate >/= 400 ft with LRAD and Mod I to demonstrate improved household mobility/community mobility    Baseline 75    Time 8    Period Weeks    Status New                   Plan - 08/20/20 1409     Clinical  Impression Statement Session focused on continued gait training and working toward breaking down gait pattern into individual tasks to promote carryover, as patient does not responsd well to cues with ambulation. Intermittent rest break required. Will continue to progress toward all LTGs.    Personal Factors and Comorbidities Comorbidity 3+;Transportation;Time since onset of injury/illness/exacerbation    Comorbidities CHF, A-Fib, Arthritis, CAD, SKD, DM Type 2, GERD, CVA, HTN, HLD, ICH    Examination-Activity Limitations Bend;Stairs;Stand;Locomotion Level;Transfers    Examination-Participation Restrictions Driving;Community Activity;Cleaning    Rehab Potential Good    PT Frequency 2x / week    PT Duration 8 weeks    PT Treatment/Interventions ADLs/Self Care Home Management;Aquatic Therapy;Cryotherapy;Electrical Stimulation;Moist Heat;DME Instruction;Gait training;Stair training;Functional mobility training;Therapeutic activities;Therapeutic exercise;Balance training;Neuromuscular re-education;Patient/family education;Orthotic Fit/Training;Manual techniques;Passive range of motion;Dry needling;Joint Manipulations    PT Next Visit Plan continue Scifit, continue to break down gait pattern into individual tasks and assess carryover, encourage a narroe BOS with activity. balance activities. stepping strategies.    PT Home Exercise Plan [phone]  Patient will benefit from skilled therapeutic intervention in order to improve the following deficits and impairments:  Abnormal gait, Decreased balance, Decreased mobility, Difficulty walking, Postural dysfunction, Decreased strength, Decreased coordination, Decreased activity tolerance, Decreased knowledge of use of DME, Impaired tone, Impaired sensation  Visit Diagnosis: Other abnormalities of gait and mobility  Unsteadiness on feet  Muscle weakness (generalized)  Difficulty in walking, not elsewhere classified     Problem  List Patient Active Problem List   Diagnosis Date Noted   Chronic diastolic CHF (congestive heart failure) ([place]) 10/28/2017   History of cerebrovascular accident (CVA) with residual deficit 05/04/2017   CKD (chronic kidney disease) stage 3, GFR 30-59 ml/min ([place]) 05/04/2017   GERD (gastroesophageal reflux disease) 02/08/2015   HTN (hypertension) 07/01/2012   ICH (intracerebral hemorrhage) ([place]) 12/22/2010   Hyperlipemia 11/21/2010   Gout 11/20/2010   CAD (coronary artery disease) 11/20/2010   Stroke ([place]) 11/19/2010   Diabetes type 2, controlled ([place]) 11/19/2010   Permanent atrial fibrillation 11/19/2010    Jones Bales, PT, DPT 08/20/2020, 2:47 PM  [facility] [address] [place], [place], [address] Phone: [phone]   Fax:  [phone]  Name: RUBERT FREDIANI MRN: [id number] Date of Birth: November 23, 1955

## 2020-08-22 ENCOUNTER — Ambulatory Visit: Payer: Medicare Other

## 2020-08-22 ENCOUNTER — Other Ambulatory Visit: Payer: Self-pay

## 2020-08-22 DIAGNOSIS — R2689 Other abnormalities of gait and mobility: Secondary | ICD-10-CM

## 2020-08-22 DIAGNOSIS — M6281 Muscle weakness (generalized): Secondary | ICD-10-CM

## 2020-08-22 DIAGNOSIS — R262 Difficulty in walking, not elsewhere classified: Secondary | ICD-10-CM

## 2020-08-22 DIAGNOSIS — R2681 Unsteadiness on feet: Secondary | ICD-10-CM

## 2020-08-22 NOTE — Therapy (Signed)
[facility] [address] [place], [place], [address] Phone: [phone]   Fax:  [phone]  Physical Therapy Treatment  Patient Details  Name: [name] MRN: [id number] Date of Birth: [birth year] Referring Provider (PT): Rochel Brome, MD   Encounter Date: 08/22/2020   PT End of Session - 08/22/20 1454     Visit Number 14    Number of Visits 17    Date for PT Re-Evaluation 08/30/20    Authorization Type UHC Medicare (10th Visit PN)    Progress Note Due on Visit 19   progress note completd on 9th visit   PT Start Time 1447    PT Stop Time 1529    PT Time Calculation (min) 42 min    Equipment Utilized During Treatment Gait belt    Activity Tolerance Patient tolerated treatment well    Behavior During Therapy [facility] for tasks assessed/performed             Past Medical History:  Diagnosis Date   A-fib ([place]) 11/19/2010   Acute exacerbation of congestive heart failure ([place]) 11/19/2010   Arthritis    CAD (coronary artery disease) 11/20/2010   CHF (congestive heart failure) ([place])    CKD (chronic kidney disease) stage 3, GFR 30-59 ml/min ([place]) 05/04/2017   Coronary artery disease    Diabetes mellitus    Diabetes mellitus 11/19/2010   Diabetes type 2, controlled ([place]) 11/19/2010   GERD (gastroesophageal reflux disease) 02/08/2015   Gout    Gout 11/20/2010   History of cerebrovascular accident (CVA) with residual deficit 05/04/2017   HTN (hypertension) 07/01/2012   Hyperlipemia 11/21/2010   Hypertension    Hypertensive emergency 11/19/2010   ICH (intracerebral hemorrhage) ([place]) 12/22/2010   Physical deconditioning 12/22/2010   Pulmonary edema 11/19/2010   Respiratory failure ([place]) 11/19/2010   Shortness of breath    Stroke ([place]) 11/19/2010   Thyroiditis 11/20/2010    Past Surgical History:  Procedure Laterality Date   PEG PLACEMENT  12/03/2010   Procedure: PERCUTANEOUS ENDOSCOPIC GASTROSTOMY (PEG) PLACEMENT;   Surgeon: Lafayette Dragon, MD;  Location: [facility] ENDOSCOPY;  Service: Endoscopy;  Laterality: N/A;   TRACHEOSTOMY TUBE PLACEMENT  11/28/2010   Procedure: TRACHEOSTOMY;  Surgeon: Beckie Salts, MD;  Location: [place];  Service: ENT;  Laterality: N/A;    There were no vitals filed for this visit.   Subjective Assessment - 08/22/20 1454     Subjective Reports trying to take longer steps at home. No other new changes/complaints.    Pertinent History CHF, A-Fib, Arthritis, CAD, SKD, DM Type 2, GERD, CVA, HTN, HLD, ICH    Limitations Standing;Walking;House hold activities    How long can you walk comfortably? 5 minutes    Patient Stated Goals Improve Mobility    Currently in Pain? No/denies                 [facility] Adult PT Treatment/Exercise - 08/22/20 0001       Transfers   Transfers Sit to Stand;Stand to Sit    Sit to Stand 5: Supervision;4: Min guard    Stand to Sit 5: Supervision;4: Min guard    Number of Reps 10 reps;1 set    Comments completed sit <> stands, cues to scoot forwards to avoid BLE hyperextended against mat to provide stabilization.      Ambulation/Gait   Ambulation/Gait Yes    Ambulation/Gait Assistance 4: Min guard    Ambulation/Gait Assistance Details ambulation into/out of therapy  session, cues for step length. as well as one gait training during session x 75 ft, cues and faciliation through pelvis for improved weight shift. little carryover noted throughout session.    Ambulation Distance (Feet) 75 Feet    Assistive device Rolling walker    Gait Pattern Step-to pattern;Decreased arm swing - right;Decreased arm swing - left;Decreased step length - right;Decreased step length - left;Decreased hip/knee flexion - right;Decreased hip/knee flexion - left;Wide base of support;Poor foot clearance - right;Poor foot clearance - left    Ambulation Surface Level;Indoor      Neuro Re-ed    Neuro Re-ed Details  completed standing balance withotu UE support, and 1# weighted ball  completed diagonal lift/chops x 10 reps, alternating direction with each set. Cues to promote trunk rotation and follow ball to promote posture. Standing without UE support working on reaching toward target (PT hand) in various directions x 15 reps, CGA required intermittently. Cues for proper bend from knees.      Exercises   Exercises Knee/Hip    Other Exercises  completed hamstring curls seated with red theraband 2 x 10 reps bilaterally, cues for slow and controlled moved.      Knee/Hip Exercises: Aerobic   Other Aerobic Completed SciFit on Level 4.0 with BLE/BUE x 7 minutes for improved reciprocal motion/strengthening and activity tolerance. Patient reporting mild discomfort in low with increase inr esistnace, tehrefore moved to 3.5                 Balance Exercises - 08/22/20 0001       Balance Exercises: Standing   Standing Eyes Opened Narrow base of support (BOS);Solid surface;30 secs;Limitations;Head turns;2 reps    Standing Eyes Opened Limitations standing with narrow BOS (require UE support to obtain position) then completed standing without UE support x 30 seconds. then with narrow BOS and eyes open compelted horizontal/vertical head turns x 10 reps each direction.               PT Education - 08/22/20 1532     Education Details Continued focus on step length at home when ambulating    Person(s) Educated Patient    Methods Explanation;Demonstration    Comprehension Verbalized understanding              PT Short Term Goals - 08/08/20 1517       PT SHORT TERM GOAL #1   Title Patient will be independent with initial HEP focused on balance/strength (All STGs Due: 08/02/20)    Baseline reports independence, completing daily    Time 4    Period Weeks    Status Achieved    Target Date 08/02/20      PT SHORT TERM GOAL #2   Title Patient will improve TUG to </= 35 seconds to demonstrate improved balance and functional mobility    Baseline 45.81 secs; 55.44  seconds on 7/26    Time 4    Period Weeks    Status Not Met      PT SHORT TERM GOAL #3   Title Patient will improve Berg Balance to >/= 30/56 to demonstrate improved balance    Baseline 25/56; 28/56    Time 4    Period Weeks    Status Not Met      PT SHORT TERM GOAL #4   Title Gait Speed to be assessed and STG/LTG to be updated as appropriate; 07/09/20 Goal is 0.25 m/s    Baseline TBA; 07/09/20 gait speed 0.18 m/s; 7/26  0.27 m/s    Time 4    Period Weeks    Status Achieved      PT SHORT TERM GOAL #5   Title Patient will verbalize understanding of fall prevention to promote safety within the home/community    Baseline dependent; verbalized understanding    Time 4    Period Weeks    Status Achieved               PT Long Term Goals - 07/01/20 1755       PT LONG TERM GOAL #1   Title Patient will be independent with final HEP focused on balance/strength (All LTGs Due: 08/30/20)    Baseline no HEP established    Time 8    Period Weeks    Status New    Target Date 08/30/20      PT LONG TERM GOAL #2   Title Patient will improve 5x sit <> stand to </= 15 seconds with UE support to demo improved balance    Baseline 19.82 secs    Time 8    Period Weeks    Status New      PT LONG TERM GOAL #3   Title Patient will improve TUG to </= 25 seconds with LRAD to demo improved balance    Baseline 45.81 secs    Time 8    Period Weeks    Status New      PT LONG TERM GOAL #4   Title Patient will improve Berg Balance to >/= 40/56 to demonstrate improved balance and reduced fall risk    Baseline 25/56    Time 8    Period Weeks    Status New      PT LONG TERM GOAL #5   Title LTG to be set for Gait Speed    Baseline TBA    Time 8    Period Weeks    Status New      Additional Long Term Goals   Additional Long Term Goals Yes      PT LONG TERM GOAL #6   Title Patient will be able to ambulate >/= 400 ft with LRAD and Mod I to demonstrate improved household mobility/community  mobility    Baseline 75    Time 8    Period Weeks    Status New                   Plan - 08/22/20 1618     Clinical Impression Statement Continued today's skilled PT session focused on continued gait training with RW, working on improved step length. Contineud BLE strengthening and balance today incorporating reaching and trunk rotation activities withotu UE support. Patient tolerating well today with intermittent rest breaks. Will continue to progress toward all LTGs.    Personal Factors and Comorbidities Comorbidity 3+;Transportation;Time since onset of injury/illness/exacerbation    Comorbidities CHF, A-Fib, Arthritis, CAD, SKD, DM Type 2, GERD, CVA, HTN, HLD, ICH    Examination-Activity Limitations Bend;Stairs;Stand;Locomotion Level;Transfers    Examination-Participation Restrictions Driving;Community Activity;Cleaning    Rehab Potential Good    PT Frequency 2x / week    PT Duration 8 weeks    PT Treatment/Interventions ADLs/Self Care Home Management;Aquatic Therapy;Cryotherapy;Electrical Stimulation;Moist Heat;DME Instruction;Gait training;Stair training;Functional mobility training;Therapeutic activities;Therapeutic exercise;Balance training;Neuromuscular re-education;Patient/family education;Orthotic Fit/Training;Manual techniques;Passive range of motion;Dry needling;Joint Manipulations    PT Next Visit Plan continue Scifit, continue to break down gait pattern into individual tasks and assess carryover, encourage a narroe BOS with activity. balance activities.  stepping strategies.    PT Home Exercise Plan [phone]             Patient will benefit from skilled therapeutic intervention in order to improve the following deficits and impairments:  Abnormal gait, Decreased balance, Decreased mobility, Difficulty walking, Postural dysfunction, Decreased strength, Decreased coordination, Decreased activity tolerance, Decreased knowledge of use of DME, Impaired tone, Impaired  sensation  Visit Diagnosis: Other abnormalities of gait and mobility  Unsteadiness on feet  Muscle weakness (generalized)  Difficulty in walking, not elsewhere classified     Problem List Patient Active Problem List   Diagnosis Date Noted   Chronic diastolic CHF (congestive heart failure) ([place]) 10/28/2017   History of cerebrovascular accident (CVA) with residual deficit 05/04/2017   CKD (chronic kidney disease) stage 3, GFR 30-59 ml/min ([facility]) 05/04/2017   GERD (gastroesophageal reflux disease) 02/08/2015   HTN (hypertension) 07/01/2012   ICH (intracerebral hemorrhage) ([place]) 12/22/2010   Hyperlipemia 11/21/2010   Gout 11/20/2010   CAD (coronary artery disease) 11/20/2010   Stroke ([place]) 11/19/2010   Diabetes type 2, controlled ([place]) 11/19/2010   Permanent atrial fibrillation 11/19/2010    Jones Bales, PT, DPT 08/22/2020, 4:21 PM  [facility] [address] [place], [place], [address] Phone: [phone]   Fax:  [phone]  Name: KASCH BORQUEZ MRN: [id number] Date of Birth: 1955-09-15

## 2020-08-27 ENCOUNTER — Other Ambulatory Visit: Payer: Self-pay

## 2020-08-27 ENCOUNTER — Ambulatory Visit: Payer: Medicare Other

## 2020-08-27 DIAGNOSIS — R262 Difficulty in walking, not elsewhere classified: Secondary | ICD-10-CM

## 2020-08-27 DIAGNOSIS — R2681 Unsteadiness on feet: Secondary | ICD-10-CM

## 2020-08-27 DIAGNOSIS — M6281 Muscle weakness (generalized): Secondary | ICD-10-CM

## 2020-08-27 DIAGNOSIS — R2689 Other abnormalities of gait and mobility: Secondary | ICD-10-CM

## 2020-08-27 NOTE — Therapy (Signed)
[facility] [address] [place], [place], [address] Phone: [phone]   Fax:  [phone]  Physical Therapy Treatment/Discharge Summary  Patient Details  Name: [name] MRN: [id number] Date of Birth: 1956-01-04 Referring Provider (PT): Rochel Brome, MD  PHYSICAL THERAPY DISCHARGE SUMMARY  Visits from Start of Care: 15  Current functional level related to goals / functional outcomes: See Clinical Impression Statement   Remaining deficits: Abnormal Gait, Decreased Balance, Fall Risk   Education / Equipment: HEP provided   Patient agrees to discharge. Patient goals were not met. Patient is being discharged due to being pleased with the current functional level.   Encounter Date: 08/27/2020   PT End of Session - 08/27/20 1400     Visit Number 15    Number of Visits 17    Date for PT Re-Evaluation 08/30/20    Authorization Type UHC Medicare (10th Visit PN)    Progress Note Due on Visit 19   progress note completd on 9th visit   PT Start Time 1400    PT Stop Time 1443    PT Time Calculation (min) 43 min    Equipment Utilized During Treatment Gait belt    Activity Tolerance Patient tolerated treatment well    Behavior During Therapy WFL for tasks assessed/performed             Past Medical History:  Diagnosis Date   A-fib ([place]) 11/19/2010   Acute exacerbation of congestive heart failure ([place]) 11/19/2010   Arthritis    CAD (coronary artery disease) 11/20/2010   CHF (congestive heart failure) ([place])    CKD (chronic kidney disease) stage 3, GFR 30-59 ml/min ([place]) 05/04/2017   Coronary artery disease    Diabetes mellitus    Diabetes mellitus 11/19/2010   Diabetes type 2, controlled ([place]) 11/19/2010   GERD (gastroesophageal reflux disease) 02/08/2015   Gout    Gout 11/20/2010   History of cerebrovascular accident (CVA) with residual deficit 05/04/2017   HTN (hypertension) 07/01/2012   Hyperlipemia  11/21/2010   Hypertension    Hypertensive emergency 11/19/2010   ICH (intracerebral hemorrhage) ([place]) 12/22/2010   Physical deconditioning 12/22/2010   Pulmonary edema 11/19/2010   Respiratory failure ([place]) 11/19/2010   Shortness of breath    Stroke ([place]) 11/19/2010   Thyroiditis 11/20/2010    Past Surgical History:  Procedure Laterality Date   PEG PLACEMENT  12/03/2010   Procedure: PERCUTANEOUS ENDOSCOPIC GASTROSTOMY (PEG) PLACEMENT;  Surgeon: Lafayette Dragon, MD;  Location: [facility] ENDOSCOPY;  Service: Endoscopy;  Laterality: N/A;   TRACHEOSTOMY TUBE PLACEMENT  11/28/2010   Procedure: TRACHEOSTOMY;  Surgeon: Beckie Salts, MD;  Location: [facility];  Service: ENT;  Laterality: N/A;    There were no vitals filed for this visit.   Subjective Assessment - 08/27/20 1405     Subjective No new changes since last visit. No falls. Reports feeling good today. No pain.    Pertinent History CHF, A-Fib, Arthritis, CAD, SKD, DM Type 2, GERD, CVA, HTN, HLD, ICH    Limitations Standing;Walking;House hold activities    How long can you walk comfortably? 5 minutes    Patient Stated Goals Improve Mobility    Currently in Pain? No/denies                [facility] PT Assessment - 08/27/20 0001       Assessment   Medical Diagnosis Chronic CVA    Referring Provider (PT) Rochel Brome, MD  [facility] Adult PT Treatment/Exercise - 08/27/20 0001       Transfers   Transfers Sit to Stand;Stand to Sit    Sit to Stand 5: Supervision    Five time sit to stand comments  17.44 secs with BUE support    Stand to Sit 5: Supervision    Comments continued cues for base of support, as patient has preference for wide BOS.      Ambulation/Gait   Ambulation/Gait Yes    Ambulation/Gait Assistance 5: Supervision;4: Min guard    Ambulation/Gait Assistance Details completed ambulation x 185 ft with RW, PT continue to provide frequent cues for step length, and keeping AD close as patient often tends to have  preference for BUE extension and BLE extension limiting normal gait pattern, minimal changes and carryover despite max cues from PT. fatigue noted at end of ambulation.    Ambulation Distance (Feet) 185 Feet    Assistive device Rolling walker    Gait Pattern Step-to pattern;Decreased arm swing - right;Decreased arm swing - left;Decreased step length - right;Decreased step length - left;Decreased hip/knee flexion - right;Decreased hip/knee flexion - left;Wide base of support;Poor foot clearance - right;Poor foot clearance - left    Ambulation Surface Level;Indoor      Standardized Balance Assessment   Standardized Balance Assessment Berg Balance Test;Timed Up and Go Test      Berg Balance Test   Sit to Stand Able to stand  independently using hands    Standing Unsupported Able to stand safely 2 minutes    Sitting with Back Unsupported but Feet Supported on Floor or Stool Able to sit safely and securely 2 minutes    Stand to Sit Controls descent by using hands    Transfers Able to transfer with verbal cueing and /or supervision    Standing Unsupported with Eyes Closed Able to stand 10 seconds safely    Standing Ubsupported with Feet Together Able to place feet together independently but unable to hold for 30 seconds    From Standing, Reach Forward with Outstretched Arm Can reach forward >12 cm safely (5")    From Standing Position, Pick up Object from Floor Able to pick up shoe safely and easily    From Standing Position, Turn to Look Behind Over each Shoulder Looks behind one side only/other side shows less weight shift    Turn 360 Degrees Needs close supervision or verbal cueing    Standing Unsupported, Alternately Place Feet on Step/Stool Able to complete >2 steps/needs minimal assist    Standing Unsupported, One Foot in Front Needs help to step but can hold 15 seconds    Standing on One Leg Tries to lift leg/unable to hold 3 seconds but remains standing independently    Total Score 36       Timed Up and Go Test   TUG Normal TUG    Normal TUG (seconds) 38.59   with RW            Access Code: [email or web address] URL: [email or web address] Date: 08/08/2020 Prepared by: Baldomero Lamy   Exercises Supine Bridge with Resistance Band - 1 x daily - 7 x weekly - 2 sets - 10 reps - 3 seconds hold Clamshell with Resistance - 1 x daily - 7 x weekly - 2 sets - 10 reps Supine March with Resistance Band - 1 x daily - 7 x weekly - 2 sets - 10 reps Supine Lower Trunk Rotation - 1 x daily - 7 x weekly - 2 sets -  10 reps Standing with Head Nod - 1 x daily - 7 x weekly - 1 sets - 10 reps Standing Balance with Eyes Closed - 1 x daily - 7 x weekly - 1 sets - 3 reps - 30 seconds hold Romberg Stance - 1 x daily - 7 x weekly - 1 sets - 3 reps - 30 seconds hold Side to Side Weight Shift with Counter Support - 1 x daily - 7 x weekly - 3 sets - 10 reps   Patient Education [place] [facility] [place]    PT Education - 08/27/20 1445     Education Details progress toward LTGs; reviewed HEP    Person(s) Educated Patient    Methods Explanation    Comprehension Verbalized understanding              PT Short Term Goals - 08/08/20 1517       PT SHORT TERM GOAL #1   Title Patient will be independent with initial HEP focused on balance/strength (All STGs Due: 08/02/20)    Baseline reports independence, completing daily    Time 4    Period Weeks    Status Achieved    Target Date 08/02/20      PT SHORT TERM GOAL #2   Title Patient will improve TUG to </= 35 seconds to demonstrate improved balance and functional mobility    Baseline 45.81 secs; 55.44 seconds on 7/26    Time 4    Period Weeks    Status Not Met      PT SHORT TERM GOAL #3   Title Patient will improve Berg Balance to >/= 30/56 to demonstrate improved balance    Baseline 25/56; 28/56    Time 4    Period Weeks    Status Not Met      PT SHORT TERM GOAL #4   Title Gait Speed to be assessed and STG/LTG to be updated  as appropriate; 07/09/20 Goal is 0.25 m/s    Baseline TBA; 07/09/20 gait speed 0.18 m/s; 7/26 0.27 m/s    Time 4    Period Weeks    Status Achieved      PT SHORT TERM GOAL #5   Title Patient will verbalize understanding of fall prevention to promote safety within the home/community    Baseline dependent; verbalized understanding    Time 4    Period Weeks    Status Achieved               PT Long Term Goals - 08/27/20 1406       PT LONG TERM GOAL #1   Title Patient will be independent with final HEP focused on balance/strength (All LTGs Due: 08/30/20)    Baseline no HEP established; reports independence with HEP, completed 3x/week    Time 8    Period Weeks    Status Achieved      PT LONG TERM GOAL #2   Title Patient will improve 5x sit <> stand to </= 15 seconds with UE support to demo improved balance    Baseline 19.82 secs; 17.44 seconds    Time 8    Period Weeks    Status Not Met      PT LONG TERM GOAL #3   Title Patient will improve TUG to </= 25 seconds with LRAD to demo improved balance    Baseline 45.81 secs; 38.59 secs on 8/16    Time 8    Period Weeks    Status Not Met  PT LONG TERM GOAL #4   Title Patient will improve Berg Balance to >/= 40/56 to demonstrate improved balance and reduced fall risk    Baseline 25/56; 36/56    Time 8    Period Weeks    Status Not Met      PT LONG TERM GOAL #5   Title LTG to be set for Gait Speed    Baseline TBA; Deferred LTG not set prior to goal check    Time 8    Period Weeks    Status Deferred      PT LONG TERM GOAL #6   Title Patient will be able to ambulate >/= 400 ft with LRAD and Mod I to demonstrate improved household mobility/community mobility    Baseline 75 ft; 185 ft with supervision/CGA with RW    Time 8    Period Weeks    Status Not Met                   Plan - 08/27/20 1454     Clinical Impression Statement Completed assesment of patient's progress toward LTGs. Patient able to meet LTG  #1. Patient has made minor progress toward all other goals at this time. Patient has demonstrated imrpoved balance with Berg balance to 36/56 but is still high fall risk. Patient continues to be limited with mobility and abnormal gait, minimal improvements in gait pattern with trial of PT services due to chronic nature and compensation. Reviewed HEP with patient. Patient states feeling comfortable iwth current level and would like to d/c today. PT in agreement. PT continued education on compliance and safety upon d/c.    Personal Factors and Comorbidities Comorbidity 3+;Transportation;Time since onset of injury/illness/exacerbation    Comorbidities CHF, A-Fib, Arthritis, CAD, SKD, DM Type 2, GERD, CVA, HTN, HLD, ICH    Examination-Activity Limitations Bend;Stairs;Stand;Locomotion Level;Transfers    Examination-Participation Restrictions Driving;Community Activity;Cleaning    Rehab Potential Good    PT Frequency 2x / week    PT Duration 8 weeks    PT Treatment/Interventions ADLs/Self Care Home Management;Aquatic Therapy;Cryotherapy;Electrical Stimulation;Moist Heat;DME Instruction;Gait training;Stair training;Functional mobility training;Therapeutic activities;Therapeutic exercise;Balance training;Neuromuscular re-education;Patient/family education;Orthotic Fit/Training;Manual techniques;Passive range of motion;Dry needling;Joint Manipulations    PT Next Visit Plan d/c this visit    PT Home Exercise Plan [phone]             Patient will benefit from skilled therapeutic intervention in order to improve the following deficits and impairments:  Abnormal gait, Decreased balance, Decreased mobility, Difficulty walking, Postural dysfunction, Decreased strength, Decreased coordination, Decreased activity tolerance, Decreased knowledge of use of DME, Impaired tone, Impaired sensation  Visit Diagnosis: Other abnormalities of gait and mobility  Unsteadiness on feet  Muscle weakness  (generalized)  Difficulty in walking, not elsewhere classified     Problem List Patient Active Problem List   Diagnosis Date Noted   Chronic diastolic CHF (congestive heart failure) ([place]) 10/28/2017   History of cerebrovascular accident (CVA) with residual deficit 05/04/2017   CKD (chronic kidney disease) stage 3, GFR 30-59 ml/min ([facility]) 05/04/2017   GERD (gastroesophageal reflux disease) 02/08/2015   HTN (hypertension) 07/01/2012   ICH (intracerebral hemorrhage) ([place]) 12/22/2010   Hyperlipemia 11/21/2010   Gout 11/20/2010   CAD (coronary artery disease) 11/20/2010   Stroke ([place]) 11/19/2010   Diabetes type 2, controlled ([place]) 11/19/2010   Permanent atrial fibrillation 11/19/2010    Jones Bales, PT, DPT 08/27/2020, 3:01 PM  [facility] [address] [place],  [place], [address] Phone: [phone]   Fax:  [phone]  Name: [name] MRN: [id number] Date of Birth: [birth year]

## 2020-08-29 ENCOUNTER — Ambulatory Visit: Payer: Medicare Other

## 2020-09-10 ENCOUNTER — Telehealth: Payer: Self-pay | Admitting: Cardiovascular Disease

## 2020-09-10 NOTE — Telephone Encounter (Signed)
   [facility] Pre-operative Risk Assessment    Request for surgical clearance:  What type of surgery is being performed? Prostate Biopsy  When is this surgery scheduled? 11/07/2020  What type of clearance is required (medical clearance vs. Pharmacy clearance to hold med vs. Both)? Medical  Are there any medications that need to be held prior to surgery and how long? Eliquis 35m 3 days prior  Practice name and name of physician performing surgery? [facility] Dr. CEllison Hughs What is your office phone number [phone]  7.   What is your office fax number [phone] 8.   Anesthesia type (None, local, MAC, general) ? No Anesthesia/Only use Lidocaine   LLarina Bras8/30/2022, 2:45 PM  _________________________________________________________________   (provider comments below)

## 2020-09-10 NOTE — Telephone Encounter (Signed)
Pharmacy, can you please comment on how long Eliquis can be held for upcoming biopsy?  Thank you!

## 2020-09-11 NOTE — Telephone Encounter (Signed)
Patient with diagnosis of afib on Eliquis for anticoagulation.    Procedure: prostate biopsy Date of procedure: 11/07/20  CHA2DS2-VASc Score = 7  This indicates a 11.2% annual risk of stroke. The patient's score is based upon: CHF History: Yes HTN History: Yes Diabetes History: Yes Stroke History: Yes Vascular Disease History: Yes Age Score: 1 Gender Score: 0   CrCl 48m/min using adjusted body weight Platelet count 302K  Request is to hold Eliquis for 3 days prior, however prefer to minimize time off of anticoagulation as much as possible given elevated CV risk. Will defer to MD for input.

## 2020-09-13 NOTE — Telephone Encounter (Signed)
Left message to call back at 7:51 AM on 09/13/2020.  Patient is call back

## 2020-09-19 ENCOUNTER — Other Ambulatory Visit: Payer: Self-pay | Admitting: Internal Medicine

## 2020-09-19 DIAGNOSIS — I129 Hypertensive chronic kidney disease with stage 1 through stage 4 chronic kidney disease, or unspecified chronic kidney disease: Secondary | ICD-10-CM

## 2020-09-19 DIAGNOSIS — I5032 Chronic diastolic (congestive) heart failure: Secondary | ICD-10-CM

## 2020-09-19 DIAGNOSIS — N1832 Chronic kidney disease, stage 3b: Secondary | ICD-10-CM

## 2020-09-19 DIAGNOSIS — E785 Hyperlipidemia, unspecified: Secondary | ICD-10-CM

## 2020-09-19 DIAGNOSIS — E875 Hyperkalemia: Secondary | ICD-10-CM

## 2020-09-19 DIAGNOSIS — E1122 Type 2 diabetes mellitus with diabetic chronic kidney disease: Secondary | ICD-10-CM

## 2020-09-25 NOTE — Telephone Encounter (Signed)
Attempted several times to speak with someone to clarify if fax was received or not but kept getting a vm. Left vm for Patty to call us back and let us know if they did not receive our fax.

## 2020-09-25 NOTE — Telephone Encounter (Signed)
   Name: [name]  DOB: [birth year]  MRN: [id number]   Primary Cardiologist: Mertie Moores, MD  Chart reviewed as part of pre-operative protocol coverage. Patient was contacted 09/25/2020 in reference to pre-operative risk assessment for pending surgery as outlined below.  [name] was last seen on 12/12/19 by Dr. Acie Fredrickson.  Since that day, [name] has done well. He exercises and participates in PT without angina.   Per our clinical pharmacist: Request is to hold Eliquis for 3 days prior, however prefer to minimize time off of anticoagulation as much as possible given elevated CV risk. Will defer to MD for input.  Per Dr. Acie Fredrickson: Pt may hold eliquis for 3 days prior to biopsy.  He  is at increased risk for stroke but if the urologist  feels this procedure is necessary for his over all well being, it will be acceptable for him to hold eliquis as requested.   I was able to reach the patient today and discussed his stroke risk. He is agreeable to proceed.  Therefore, based on ACC/AHA guidelines, the patient would be at acceptable risk for the planned procedure without further cardiovascular testing.   The patient was advised that if he develops new symptoms prior to surgery to contact our office to arrange for a follow-up visit, and he verbalized understanding.  I will route this recommendation to the requesting party via Epic fax function and remove from pre-op pool. Please call with questions.  Tami Lin Claris Pech, PA 09/25/2020, 11:35 AM

## 2020-10-24 ENCOUNTER — Telehealth: Payer: Self-pay | Admitting: Oncology

## 2020-10-24 NOTE — Telephone Encounter (Signed)
Pt called in to r/s appt from September referral. Scheduled appt per pt preference, pt had requested a November appt. Pt is aware of appt date and time. Mailed updated calendar to pt per request.

## 2020-11-19 ENCOUNTER — Other Ambulatory Visit: Payer: Self-pay

## 2020-11-19 ENCOUNTER — Ambulatory Visit (INDEPENDENT_AMBULATORY_CARE_PROVIDER_SITE_OTHER): Payer: Medicare Other | Admitting: Family Medicine

## 2020-11-19 ENCOUNTER — Encounter: Payer: Self-pay | Admitting: Family Medicine

## 2020-11-19 VITALS — BP 122/70 | HR 77 | Temp 97.9°F | Ht 71.0 in | Wt 225.0 lb

## 2020-11-19 DIAGNOSIS — I10 Essential (primary) hypertension: Secondary | ICD-10-CM

## 2020-11-19 DIAGNOSIS — E1121 Type 2 diabetes mellitus with diabetic nephropathy: Secondary | ICD-10-CM | POA: Diagnosis not present

## 2020-11-19 DIAGNOSIS — Z23 Encounter for immunization: Secondary | ICD-10-CM

## 2020-11-19 DIAGNOSIS — I4821 Permanent atrial fibrillation: Secondary | ICD-10-CM

## 2020-11-19 DIAGNOSIS — E7800 Pure hypercholesterolemia, unspecified: Secondary | ICD-10-CM | POA: Diagnosis not present

## 2020-11-19 DIAGNOSIS — N1831 Chronic kidney disease, stage 3a: Secondary | ICD-10-CM

## 2020-11-19 DIAGNOSIS — I5032 Chronic diastolic (congestive) heart failure: Secondary | ICD-10-CM | POA: Diagnosis not present

## 2020-11-19 NOTE — Progress Notes (Signed)
Provider:  Alain Honey, MD  Careteam: Patient Care Team: Andree Moro, DO as PCP - General (General Practice) Nahser, Wonda Cheng, MD as PCP - Cardiology (Cardiology) Anda Kraft, MD (Endocrinology) Bernell List, CPhT as [facility] (Pharmacy Technician)  PLACE OF SERVICE:  [facility] Directive information Does Patient Have a Medical Advance Directive?: Yes, Type of Advance Directive: [place];Living will, Does patient want to make changes to medical advance directive?: No - Patient declined  No Known Allergies  Chief Complaint  Patient presents with   Establish Care    New patient establish care. Flu vaccine today.      HPI: Patient is a 65 y.o. male patient is here to establish care.  Chronic problems include type 2 diabetes, coronary artery disease, atrial fibrillation, hyperlipidemia, hypertension, atrial fibrillation, and chronic kidney disease stage III Patient had a stroke about 10 years ago.  Has residual deficits.  He ambulates with a walker and wheelchair.  He is followed by cardiology, and renal doctors. He denies any new symptoms or problems. We reviewed his medicines today.  There is some question about continuation of metformin given his renal function.  He takes Januvia as well as Jardiance for diabetes.  Review of Systems:  Review of Systems  Constitutional: Negative.   HENT: Negative.    Eyes: Negative.   Respiratory: Negative.    Cardiovascular:  Positive for leg swelling.  Gastrointestinal: Negative.   Skin: Negative.   Neurological:  Positive for focal weakness.  Psychiatric/Behavioral: Negative.    All other systems reviewed and are negative.  Past Medical History:  Diagnosis Date   A-fib ([place]) 11/19/2010   Acute exacerbation of congestive heart failure ([place]) 11/19/2010   Arthritis    CAD (coronary artery disease) 11/20/2010   CHF (congestive heart failure) ([facility])    CKD  (chronic kidney disease) stage 3, GFR 30-59 ml/min ([place]) 05/04/2017   Coronary artery disease    Diabetes type 2, controlled ([place]) 11/19/2010   GERD (gastroesophageal reflux disease) 02/08/2015   Gout    Gout 11/20/2010   History of cerebrovascular accident (CVA) with residual deficit 05/04/2017   HTN (hypertension) 07/01/2012   Hyperlipemia 11/21/2010   Hypertension    Hypertensive emergency 11/19/2010   ICH (intracerebral hemorrhage) ([place]) 12/22/2010   Physical deconditioning 12/22/2010   Pulmonary edema 11/19/2010   Respiratory failure ([place]) 11/19/2010   Shortness of breath    Stroke ([place]) 11/19/2010   Thyroiditis 11/20/2010   Past Surgical History:  Procedure Laterality Date   PEG PLACEMENT  12/03/2010   Procedure: PERCUTANEOUS ENDOSCOPIC GASTROSTOMY (PEG) PLACEMENT;  Surgeon: Lafayette Dragon, MD;  Location: [facility] ENDOSCOPY;  Service: Endoscopy;  Laterality: N/A;   TRACHEOSTOMY TUBE PLACEMENT  11/28/2010   Procedure: TRACHEOSTOMY;  Surgeon: Beckie Salts, MD;  Location: [facility];  Service: ENT;  Laterality: N/A;   Social History:   reports that he quit smoking about 14 years ago. His smoking use included cigarettes. He has never used smokeless tobacco. He reports that he does not drink alcohol and does not use drugs.  Family History  Problem Relation Age of Onset   Diabetes Mother    Diabetes Brother    Colon cancer Neg Hx    Colon polyps Neg Hx    Kidney disease Neg Hx    Esophageal cancer Neg Hx    Heart disease Neg Hx    Gallbladder disease Neg Hx     Medications: Patient's  Medications  New Prescriptions   No medications on file  Previous Medications   ALCOHOL SWABS (ALCOHOL PREP) 70 % PADS       BLOOD GLUCOSE MONITORING SUPPL (ONETOUCH VERIO) W/DEVICE KIT    Use as directed to test blood sugar once daily   CARVEDILOL (COREG) 25 MG TABLET    Take 1 tablet (25 mg total) by mouth 2 (two) times daily with a meal.   DILTIAZEM (CARDIZEM CD) 180 MG 24 HR CAPSULE    TAKE 2  CAPSULES BY MOUTH  DAILY AT NOON   ELIQUIS 5 MG TABS TABLET    TAKE 1 TABLET BY MOUTH  TWICE DAILY   EMPAGLIFLOZIN (JARDIANCE) 10 MG TABS TABLET    Take 10 mg by mouth daily.   FUROSEMIDE (LASIX) 40 MG TABLET    Take 40 mg by mouth as needed.   GLUCOSE BLOOD (ONETOUCH VERIO) TEST STRIP    Use as directed to test blood sugar once daily   HYDRALAZINE (APRESOLINE) 50 MG TABLET    Take 1 tablet (50 mg total) by mouth 2 (two) times daily.   ISOSORBIDE MONONITRATE (IMDUR) 60 MG 24 HR TABLET    Take 30 mg by mouth daily.   METFORMIN (GLUCOPHAGE) 850 MG TABLET    TAKE 1 TABLET BY MOUTH TWO  TIMES DAILY WITH MEALS   ONETOUCH DELICA LANCETS 55H MISC    Use as directed to test blood sugar once daily   PANTOPRAZOLE (PROTONIX) 40 MG TABLET    TAKE 1 TABLET BY MOUTH  DAILY AT 12 NOON.   PRAVASTATIN (PRAVACHOL) 40 MG TABLET    Take 40 mg by mouth daily.   SPIRONOLACTONE (ALDACTONE) 25 MG TABLET    Take 1 tablet (25 mg total) by mouth daily.  Modified Medications   No medications on file  Discontinued Medications   No medications on file    Physical Exam:  Vitals:   11/19/20 1006  Pulse: 77  Temp: 97.9 F (36.6 C)  TempSrc: Temporal  SpO2: 99%  Weight: 225 lb (102.1 kg)  Height: $Remove'5\' 11"'fDPeftZ$  (1.803 m)   Body mass index is 31.38 kg/m. Wt Readings from Last 3 Encounters:  11/19/20 225 lb (102.1 kg)  12/12/19 231 lb 12.8 oz (105.1 kg)  12/12/18 243 lb 12.8 oz (110.6 kg)    Physical Exam Vitals and nursing note reviewed.  Constitutional:      Appearance: Normal appearance.     Comments: Patient presents in wheelchair  Cardiovascular:     Rate and Rhythm: Normal rate and regular rhythm.  Pulmonary:     Effort: Pulmonary effort is normal.     Breath sounds: Normal breath sounds.  Abdominal:     General: Abdomen is flat. Bowel sounds are normal.  Musculoskeletal:     Cervical back: Normal range of motion.  Neurological:     General: No focal deficit present.     Mental Status: He is alert  and oriented to person, place, and time.  Psychiatric:        Mood and Affect: Mood normal.        Behavior: Behavior normal.    Labs reviewed: Basic Metabolic Panel: Recent Labs    12/12/19 1544  NA 135  K 5.1  CL 96  CO2 23  GLUCOSE 145*  BUN 31*  CREATININE 1.63*  CALCIUM 10.1   Liver Function Tests: Recent Labs    12/12/19 1544  AST 16  ALT 14  ALKPHOS 69  BILITOT 0.2  PROT 7.9  ALBUMIN 4.7   No results for input(s): LIPASE, AMYLASE in the last 8760 hours. No results for input(s): AMMONIA in the last 8760 hours. CBC: Recent Labs    12/12/19 1544  WBC 6.6  HGB 15.0  HCT 44.7  MCV 87  PLT 302   Lipid Panel: Recent Labs    12/12/19 1544  CHOL 181  HDL 53  LDLCALC 97  TRIG 179*  CHOLHDL 3.4   TSH: No results for input(s): TSH in the last 8760 hours. A1C: Lab Results  Component Value Date   HGBA1C 6.6 (A) 12/06/2017     Assessment/Plan  1. Need for influenza vaccination  - Flu Vaccine QUAD High Dose(Fluad)  2. Controlled type 2 diabetes mellitus with diabetic nephropathy, without long-term current use of insulin ([place]) Concerned about continued use of metformin given his decline in renal function - Ambulatory referral to Ophthalmology - Hemoglobin A1c  3. Pure hypercholesterolemia Lipids are not at goal.  We will check today and if still not at goal changed to more potent statin - Lipid Panel  4. Chronic diastolic CHF (congestive heart failure) ([place]) He does have trace edema this morning.  There is no shortness of breath.  He takes furosemide as needed when there is swelling  5. Stage 3a chronic kidney disease ([place]) Has seen renal specialist.  No new recommendations at last visit per patient  6. Primary hypertension Medications include Coreg Cardizem Apresoline blood pressure is good at 122/70  7. Permanent atrial fibrillation Seems to be in sinus rhythm today.  Continue on beta blocker as well as calcium data blocker and  anticoagulant.  Sees cardiology annually   Alain Honey, MD [facility] [phone]

## 2020-11-20 LAB — HEMOGLOBIN A1C
Hgb A1c MFr Bld: 7.7 % of total Hgb — ABNORMAL HIGH (ref <5.7)
Mean Plasma Glucose: 174 mg/dL
eAG (mmol/L): 9.7 mmol/L

## 2020-11-20 LAB — LIPID PANEL
Cholesterol: 152 mg/dL (ref <200)
HDL: 50 mg/dL (ref >=40)
LDL Cholesterol (Calc): 64 mg/dL (calc)
Non-HDL Cholesterol (Calc): 102 mg/dL (calc) (ref <130)
Total CHOL/HDL Ratio: 3 (calc) (ref <5.0)
Triglycerides: 287 mg/dL — ABNORMAL HIGH (ref <150)

## 2020-11-26 ENCOUNTER — Ambulatory Visit
Admission: RE | Admit: 2020-11-26 | Discharge: 2020-11-26 | Disposition: A | Discharge status: Home / Self Care | Payer: Medicare Other | Source: Ambulatory Visit | Attending: Internal Medicine | Admitting: Internal Medicine

## 2020-11-26 DIAGNOSIS — E785 Hyperlipidemia, unspecified: Secondary | ICD-10-CM

## 2020-11-26 DIAGNOSIS — I129 Hypertensive chronic kidney disease with stage 1 through stage 4 chronic kidney disease, or unspecified chronic kidney disease: Secondary | ICD-10-CM

## 2020-11-26 DIAGNOSIS — N1832 Chronic kidney disease, stage 3b: Secondary | ICD-10-CM

## 2020-11-26 DIAGNOSIS — I5032 Chronic diastolic (congestive) heart failure: Secondary | ICD-10-CM

## 2020-11-26 DIAGNOSIS — E875 Hyperkalemia: Secondary | ICD-10-CM

## 2020-11-26 DIAGNOSIS — E1122 Type 2 diabetes mellitus with diabetic chronic kidney disease: Secondary | ICD-10-CM

## 2020-11-28 ENCOUNTER — Inpatient Hospital Stay: Payer: Medicare Other | Attending: Oncology | Admitting: Oncology

## 2020-11-28 ENCOUNTER — Other Ambulatory Visit: Payer: Self-pay

## 2020-11-28 VITALS — BP 112/77 | HR 92 | Temp 98.2°F | Resp 17 | Ht 71.0 in | Wt 225.0 lb

## 2020-11-28 DIAGNOSIS — N189 Chronic kidney disease, unspecified: Secondary | ICD-10-CM | POA: Diagnosis not present

## 2020-11-28 DIAGNOSIS — Z87891 Personal history of nicotine dependence: Secondary | ICD-10-CM

## 2020-11-28 DIAGNOSIS — R779 Abnormality of plasma protein, unspecified: Secondary | ICD-10-CM

## 2020-11-28 DIAGNOSIS — R770 Abnormality of albumin: Secondary | ICD-10-CM | POA: Insufficient documentation

## 2020-11-28 DIAGNOSIS — I509 Heart failure, unspecified: Secondary | ICD-10-CM | POA: Diagnosis not present

## 2020-11-28 DIAGNOSIS — D472 Monoclonal gammopathy: Secondary | ICD-10-CM

## 2020-11-28 NOTE — Progress Notes (Signed)
Reason for the request:    Abnormal serum light chains.  HPI: I was asked by Dr. Joylene Grapes to evaluate [name] or abnormal serum light chains.  He is a 65 year old man with history of congestive heart failure, coronary artery disease as well as chronic renal insufficiency.  His creatinine and November 2021 was 1.6 compared to 1.56 in 2019.  He has normal electrolytes and a normal CBC otherwise.  He was evaluated by Dr. Joylene Grapes from [facility] and his work-up revealed abnormal low serum light chains but no M spike detected on his serum protein electrophoresis.  He had a normal CBC with a hemoglobin of 15.5 with a creatinine of 1.84 and otherwise normal electrolytes.  His kappa free light chains were 41 which is elevated as well as his free lambda light chains were also elevated at 208.  The kappa to lambda ratio slightly declined below normal range at 0.2.  Clinically, he reports no specific symptoms related to this.  He denies at any chest pain, back pain or palpitation.  He denies any hospitalizations or illnesses.  His mobility is limited related to multiple comorbid conditions.  He lives independently and recently had a prostate biopsy for an elevated PSA.  He does not report any headaches, blurry vision, syncope or seizures. Does not report any fevers, chills or sweats.  Does not report any cough, wheezing or hemoptysis.  Does not report any chest pain, palpitation, orthopnea or leg edema.  Does not report any nausea, vomiting or abdominal pain.  Does not report any constipation or diarrhea.  Does not report any skeletal complaints.    Does not report frequency, urgency or hematuria.  Does not report any skin rashes or lesions. Does not report any heat or cold intolerance.  Does not report any lymphadenopathy or petechiae.  Does not report any anxiety or depression.  Remaining review of systems is negative.     Past Medical History:  Diagnosis Date   A-fib ([place]) 11/19/2010   Acute  exacerbation of congestive heart failure ([place]) 11/19/2010   Arthritis    CAD (coronary artery disease) 11/20/2010   CHF (congestive heart failure) (H[facility])    CKD (chronic kidney disease) stage 3, GFR 30-59 ml/min ([place]) 05/04/2017   Coronary artery disease    Diabetes type 2, controlled ([place]) 11/19/2010   GERD (gastroesophageal reflux disease) 02/08/2015   Gout    Gout 11/20/2010   History of cerebrovascular accident (CVA) with residual deficit 05/04/2017   HTN (hypertension) 07/01/2012   Hyperlipemia 11/21/2010   Hypertension    Hypertensive emergency 11/19/2010   ICH (intracerebral hemorrhage) ([place]) 12/22/2010   Physical deconditioning 12/22/2010   Pulmonary edema 11/19/2010   Respiratory failure ([place]) 11/19/2010   Shortness of breath    Stroke ([place]) 11/19/2010   Thyroiditis 11/20/2010  :   Past Surgical History:  Procedure Laterality Date   PEG PLACEMENT  12/03/2010   Procedure: PERCUTANEOUS ENDOSCOPIC GASTROSTOMY (PEG) PLACEMENT;  Surgeon: Lafayette Dragon, MD;  Location: [facility] ENDOSCOPY;  Service: Endoscopy;  Laterality: N/A;   TRACHEOSTOMY TUBE PLACEMENT  11/28/2010   Procedure: TRACHEOSTOMY;  Surgeon: Beckie Salts, MD;  Location: [facility] OR;  Service: ENT;  Laterality: N/A;  :   Current Outpatient Medications:    Alcohol Swabs (ALCOHOL PREP) 70 % PADS, , Disp: , Rfl:    Blood Glucose Monitoring Suppl (ONETOUCH VERIO) w/Device KIT, Use as directed to test blood sugar once daily, Disp: 1 kit, Rfl: 0   carvedilol (COREG) 25  MG tablet, Take 1 tablet (25 mg total) by mouth 2 (two) times daily with a meal., Disp: 180 tablet, Rfl: 3   diltiazem (CARDIZEM CD) 180 MG 24 hr capsule, TAKE 2 CAPSULES BY MOUTH  DAILY AT NOON, Disp: 180 capsule, Rfl: 3   ELIQUIS 5 MG TABS tablet, TAKE 1 TABLET BY MOUTH  TWICE DAILY, Disp: 180 tablet, Rfl: 3   empagliflozin (JARDIANCE) 10 MG TABS tablet, Take 10 mg by mouth daily., Disp: , Rfl:    furosemide (LASIX) 40 MG tablet, Take 40 mg by mouth as needed.,  Disp: , Rfl:    glucose blood (ONETOUCH VERIO) test strip, Use as directed to test blood sugar once daily, Disp: 100 each, Rfl: 12   isosorbide mononitrate (IMDUR) 30 MG 24 hr tablet, Take 30 mg by mouth daily., Disp: , Rfl:    lisinopril-hydrochlorothiazide (ZESTORETIC) 20-25 MG tablet, Take 1 tablet by mouth daily., Disp: , Rfl:    metFORMIN (GLUCOPHAGE) 850 MG tablet, Take 850 mg by mouth daily with breakfast., Disp: , Rfl:    Multiple Vitamins-Minerals (MENS ONE DAILY PO), Take 1 tablet by mouth daily., Disp: , Rfl:    ONETOUCH DELICA LANCETS 24M MISC, Use as directed to test blood sugar once daily, Disp: 100 each, Rfl: 12   oxybutynin (DITROPAN-XL) 5 MG 24 hr tablet, Take 5 mg by mouth at bedtime., Disp: , Rfl:    pantoprazole (PROTONIX) 40 MG tablet, TAKE 1 TABLET BY MOUTH  DAILY AT 12 NOON., Disp: 90 tablet, Rfl: 3   pravastatin (PRAVACHOL) 40 MG tablet, Take 40 mg by mouth daily., Disp: , Rfl:    sitaGLIPtin (JANUVIA) 50 MG tablet, Take 50 mg by mouth daily., Disp: , Rfl:    spironolactone (ALDACTONE) 25 MG tablet, Take 1 tablet (25 mg total) by mouth daily., Disp: 90 tablet, Rfl: 3   tamsulosin (FLOMAX) 0.4 MG CAPS capsule, Take 0.4 mg by mouth daily., Disp: , Rfl: :  No Known Allergies:   Family History  Problem Relation Age of Onset   Diabetes Mother    Diabetes Brother    Colon cancer Neg Hx    Colon polyps Neg Hx    Kidney disease Neg Hx    Esophageal cancer Neg Hx    Heart disease Neg Hx    Gallbladder disease Neg Hx   :   Social History   Socioeconomic History   Marital status: Single    Spouse name: Not on file   Number of children: 0   Years of education: Not on file   Highest education level: Not on file  Occupational History   Occupation: Disability  Tobacco Use   Smoking status: Former    Types: Cigarettes    Quit date: 12/19/2005    Years since quitting: 14.9   Smokeless tobacco: Never  Vaping Use   Vaping Use: Never used  Substance and Sexual  Activity   Alcohol use: No    Alcohol/week: 11.0 standard drinks    Types: 6 Glasses of wine, 5 Shots of liquor per week    Comment: quit in 2012   Drug use: No   Sexual activity: Not on file  Other Topics Concern   Not on file  Social History Narrative   Diet: No      Caffeine: Yes      Married, if yes what year: No      Do you live in a house, apartment, assisted living, condo, trailer, ect: [employer]  Is it one or more stories: No      How many persons live in your home? One      Pets: No      Highest level or education completed: 4 year college      Current/Past profession: Business       Exercise: Yes                 Type and how often: Every other day         Living Will: Yes   DNR: No   POA/HPOA: Yes      Functional Status:   Do you have difficulty bathing or dressing yourself? No   Do you have difficulty preparing food or eating? No   Do you have difficulty managing your medications? No   Do you have difficulty managing your finances? No   Do you have difficulty affording your medications? No   Social Determinants of Radio broadcast assistant Strain: Not on file  Food Insecurity: Not on file  Transportation Needs: Not on file  Physical Activity: Not on file  Stress: Not on file  Social Connections: Not on file  Intimate Partner Violence: Not on file  :  Pertinent items are noted in HPI.  Exam:  General appearance: alert and cooperative appeared without distress. Head: atraumatic without any abnormalities. Eyes: conjunctivae/corneas clear. PERRL.  Sclera anicteric. Throat: lips, mucosa, and tongue normal; without oral thrush or ulcers. Resp: clear to auscultation bilaterally without rhonchi, wheezes or dullness to percussion. Cardio: regular rate and rhythm, S1, S2 normal, no murmur, click, rub or gallop GI: soft, non-tender; bowel sounds normal; no masses,  no organomegaly Skin: Skin color, texture, turgor normal. No rashes or lesions Lymph  nodes: Cervical, supraclavicular, and axillary nodes normal. Neurologic: Grossly normal without any motor, sensory or deep tendon reflexes. Musculoskeletal: No joint deformity or effusion.    US RENAL  Result Date: 11/27/2020 CLINICAL DATA:  Chronic kidney disease. EXAM: RENAL / URINARY TRACT ULTRASOUND COMPLETE COMPARISON:  None. FINDINGS: Right Kidney: Renal measurements: 10.1 x 5.6 x 5.3 cm = volume: 154 mL. Echogenicity within normal limits. No mass, calculus or hydronephrosis visualized. Left Kidney: Renal measurements: 10.8 x 5.2 x 4.9 = volume: 145 mL. Echogenicity within normal limits. No mass, calculus or hydronephrosis visualized. There is a 1.4 cm simple cyst in the superior pole, and a 1.1 cm simple cyst in the inferior pole Bladder: Appears normal for degree of bladder distention. Other: There is a mild bladder base impression by an enlarged prostate with prostate dimensions of 4.6 x 4.9 x 5.3 cm. No free fluid is seen. ureteral jets and the bladder volume were not evaluated. IMPRESSION: 1. Small left renal cysts.  Otherwise negative renal ultrasound. 2. Enlarged prostate impressing on the posterior bladder. No focal bladder thickening is seen. Electronically Signed   By: Telford Nab M.D.   On: 11/27/2020 01:18    Assessment and Plan:    65 year old with:  1.  Abnormal serum light chains with elevated free kappa and free lambda light chains although the ratio is slightly depressed to 0.2 g/dL.  He has a normal serum protein electrophoresis otherwise.  A differential diagnosis of these findings were reviewed.  The likelihood of a plasma cell disorder appears to be very low at this time given his presentation.  The abnormalities in his serum free light chains could be related to chronic renal insufficiency rather than a plasma cell disorder.  For completeness sake, I  will repeat laboratory testing in 4 months including serum protein electrophoresis, quantitative immunoglobulins and  serum light chains.  Further multiple myeloma work-up such as bone marrow biopsy and imaging studies will be done according to these laboratory testing.  2.  The chronic renal insufficiency: I do not believe this is related to his plasma cell disorder but rather than other comorbid conditions including hypertension diabetes.  3.  Prostate cancer screening: He is currently under evaluation by Dr. Lovena Neighbours.  He underwent prostate biopsy for an elevated PSA.  4.  Follow-up: Will be in 4 months for repeat follow-up.  45  minutes were dedicated to this visit. The time was spent on reviewing laboratory data, discussing differential diagnosis and answering questions regarding future plan.     A copy of this consult has been forwarded to the requesting physician.

## 2020-12-17 ENCOUNTER — Ambulatory Visit: Payer: Medicare Other | Admitting: Cardiovascular Disease

## 2020-12-17 ENCOUNTER — Encounter: Payer: Self-pay | Admitting: Cardiovascular Disease

## 2020-12-17 ENCOUNTER — Other Ambulatory Visit: Payer: Self-pay

## 2020-12-17 VITALS — BP 122/68 | HR 77 | Ht 71.0 in | Wt 225.2 lb

## 2020-12-17 DIAGNOSIS — I10 Essential (primary) hypertension: Secondary | ICD-10-CM

## 2020-12-17 NOTE — Progress Notes (Signed)
Cardiology Office Note   Date:  12/17/2020   ID:  [name], [name] 07-31-1955, MRN [id number]  PCP:  Wardell Honour, MD  Cardiologist:   Mertie Moores, MD  Former patient of Dr. Verl Blalock   Chief Complaint  Patient presents with   Atrial Fibrillation        Coronary Artery Disease        Problem List 1. Atrial fib  2. Chronic diastolic CHF 3. Essential HTN 4. Intracranial hemorrhage ( on coumadin )  5. DM 6. COPD      Previous notes:  [name] is a 65 y.o. male who presents for further evaluation of paroxysmal atrial fibrillation.  He was last seen by Dr. Verl Blalock in Dec. 2014.  No CP recently, no dyspnea  Has better fine motor control in the left side of his body   Able to walk with a walker,  Was seen today in a wheelchair.   March 27, 2016:  Now using a walker ( was in a wheelchair at his last visit )  No CP or dyspnea  BP remains low   July 30, 2017:    Follow up for atrial fib .   Has a history of chronic diastolic congestive heart failure.  Also has a history of hypertension, hyperlipidemia.  Is on PRN lasix - is not on potassium  . Breathing is ok .   Legs are very swollen    Nov. 30,  2020   Here for preoperative evaluation prior to prostate biopsy. Is here in a wheelchair.   Uses a walker at home Had a stroke years ago  Takes his lasix just once a month - is concerned that the Lasix may be contributing to his slight increase in creatinine. He has 1-2+ bilateral ankle edema.  He gets around using a walker so some of this edema may be due to the fact that he is relatively inactive from his stroke.  Nov. 30, 2021: [name] is seen today for follow up of his atrial fib  chronic diastolic CHF Is in Afib today .  Has a stroke in the past, and is now on Eliquis  Hx of HTN and HLD.  Had a stroke,  Uses a walker or wheelchair. No cp or dyspnea.  Able to do his   Dec. 6, 2022 [name] is seen today for follow up of his atrial fib , chronic  diastolic CHF , HTN, HLD  Has hx of stroke,  is on Eliquis  Uses a walker around his house ,  in wheelchair here at the office.   Last lipids from November, 2022 reveals a total cholesterol of 152.  HDL is 50.  LDL is 64.  Triglyceride level is 287. Hb A1C is 7.7    Past Medical History:  Diagnosis Date   A-fib ([place]) 11/19/2010   Acute exacerbation of congestive heart failure ([place]) 11/19/2010   Arthritis    CAD (coronary artery disease) 11/20/2010   CHF (congestive heart failure) ([facility])    CKD (chronic kidney disease) stage 3, GFR 30-59 ml/min ([place]) 05/04/2017   Coronary artery disease    Diabetes type 2, controlled ([place]) 11/19/2010   GERD (gastroesophageal reflux disease) 02/08/2015   Gout    Gout 11/20/2010   History of cerebrovascular accident (CVA) with residual deficit 05/04/2017   HTN (hypertension) 07/01/2012   Hyperlipemia 11/21/2010   Hypertension    Hypertensive emergency 11/19/2010   ICH (intracerebral hemorrhage) ([place]) 12/22/2010   Physical  deconditioning 12/22/2010   Pulmonary edema 11/19/2010   Respiratory failure ([place]) 11/19/2010   Shortness of breath    Stroke ([place]) 11/19/2010   Thyroiditis 11/20/2010    Past Surgical History:  Procedure Laterality Date   PEG PLACEMENT  12/03/2010   Procedure: PERCUTANEOUS ENDOSCOPIC GASTROSTOMY (PEG) PLACEMENT;  Surgeon: Lafayette Dragon, MD;  Location: [facility] ENDOSCOPY;  Service: Endoscopy;  Laterality: N/A;   TRACHEOSTOMY TUBE PLACEMENT  11/28/2010   Procedure: TRACHEOSTOMY;  Surgeon: Beckie Salts, MD;  Location: [facility] OR;  Service: ENT;  Laterality: N/A;     Current Outpatient Medications  Medication Sig Dispense Refill   Alcohol Swabs (ALCOHOL PREP) 70 % PADS      Blood Glucose Monitoring Suppl (ONETOUCH VERIO) w/Device KIT Use as directed to test blood sugar once daily 1 kit 0   carvedilol (COREG) 25 MG tablet Take 1 tablet (25 mg total) by mouth 2 (two) times daily with a meal. 180 tablet 3   diltiazem (CARDIZEM CD) 180  MG 24 hr capsule TAKE 2 CAPSULES BY MOUTH  DAILY AT NOON 180 capsule 3   ELIQUIS 5 MG TABS tablet TAKE 1 TABLET BY MOUTH  TWICE DAILY 180 tablet 3   empagliflozin (JARDIANCE) 10 MG TABS tablet Take 10 mg by mouth daily.     furosemide (LASIX) 40 MG tablet Take 40 mg by mouth as needed.     glucose blood (ONETOUCH VERIO) test strip Use as directed to test blood sugar once daily 100 each 12   isosorbide mononitrate (IMDUR) 30 MG 24 hr tablet Take 30 mg by mouth daily.     lisinopril-hydrochlorothiazide (ZESTORETIC) 20-25 MG tablet Take 1 tablet by mouth daily.     metFORMIN (GLUCOPHAGE) 850 MG tablet Take 850 mg by mouth daily with breakfast.     Multiple Vitamins-Minerals (MENS ONE DAILY PO) Take 1 tablet by mouth daily.     ONETOUCH DELICA LANCETS 56D MISC Use as directed to test blood sugar once daily 100 each 12   oxybutynin (DITROPAN-XL) 5 MG 24 hr tablet Take 5 mg by mouth at bedtime.     pantoprazole (PROTONIX) 40 MG tablet TAKE 1 TABLET BY MOUTH  DAILY AT 12 NOON. 90 tablet 3   pravastatin (PRAVACHOL) 40 MG tablet Take 40 mg by mouth daily.     sitaGLIPtin (JANUVIA) 50 MG tablet Take 50 mg by mouth daily.     spironolactone (ALDACTONE) 25 MG tablet Take 1 tablet (25 mg total) by mouth daily. 90 tablet 3   tamsulosin (FLOMAX) 0.4 MG CAPS capsule Take 0.4 mg by mouth daily.     No current facility-administered medications for this visit.    Allergies:   Patient has no known allergies.    Social History:  The patient  reports that he quit smoking about 15 years ago. His smoking use included cigarettes. He has never used smokeless tobacco. He reports that he does not drink alcohol and does not use drugs.   Family History:  The patient's family history includes Diabetes in his brother and mother.    ROS: Noted in current history, otherwise review of systems is negative.  Physical Exam: Blood pressure 122/68, pulse 77, height _0  (1.803 m), weight 225 lb 3.2 oz (102.2 kg), SpO2 99  %.  GEN:  Well nourished, well developed in no acute distress HEENT: Normal NECK: No JVD; No carotid bruits LYMPHATICS: No lymphadenopathy CARDIAC: irreg. Irreg.  RESPIRATORY:  Clear to auscultation without rales, wheezing or rhonchi  ABDOMEN: Soft, non-tender, non-distended MUSCULOSKELETAL:  No edema; No deformity  SKIN: Warm and dry NEUROLOGIC:  Alert and oriented x 3   EKG: December 17, 2020: atrial  fibrillation rate of 77.  Controlled ventricular response.  Recent Labs: No results found for requested labs within last 8760 hours.    Lipid Panel    Component Value Date/Time   CHOL 152 11/19/2020 1105   CHOL 181 12/12/2019 1544   TRIG 287 (H) 11/19/2020 1105   HDL 50 11/19/2020 1105   HDL 53 12/12/2019 1544   CHOLHDL 3.0 11/19/2020 1105   VLDL 25 02/07/2016 1152   LDLCALC 64 11/19/2020 1105      Wt Readings from Last 3 Encounters:  12/17/20 225 lb 3.2 oz (102.2 kg)  11/28/20 225 lb (102.1 kg)  11/19/20 225 lb (102.1 kg)      Other studies Reviewed: Additional studies/ records that were reviewed today include: . Review of the above records demonstrates:    ASSESSMENT AND PLAN:  1. Atrial fib - he is in chronic atrial fibrillation.  CHADS2VASC is 2 ( HTN, diastolic CHF )    HR is well controlled.     2. Chronic diastolic CHF -      3. Essential HTN -   BP is well controlled.     4. Intracranial hemorrhage   -   5.  Hyperlipidemia:   Last lipids from November, 2022 reveals a total cholesterol of 152.  HDL is 50.  LDL is 64.  Triglyceride level is 287.  He doe snot want to add any more meds.   I advised him to watch his carbs.   He still eats lots of potatoes.     Current medicines are reviewed at length with the patient today.  The patient does not have concerns regarding medicines.  The following changes have been made:  no change  Labs/ tests ordered today include:  No orders of the defined types were placed in this encounter.   Disposition:         Mertie Moores, MD  12/17/2020 11:23 AM    [address], [place], [place]  [address] Phone: [phone]; Fax: [phone]

## 2020-12-17 NOTE — Patient Instructions (Addendum)
Medication Instructions:  Your physician recommends that you continue on your current medications as directed. Please refer to the Current Medication list given to you today.  *If you need a refill on your cardiac medications before your next appointment, please call your pharmacy*   Lab Work: BMET and ALT today  If you have labs (blood work) drawn today and your tests are completely normal, you will receive your results only by: [place] (if you have MyChart) OR A paper copy in the mail If you have any lab test that is abnormal or we need to change your treatment, we will call you to review the results.   Testing/Procedures: None   Follow-Up: At [facility], you and your health needs are our priority.  As part of our continuing mission to provide you with exceptional heart care, we have created designated Provider Care Teams.  These Care Teams include your primary Cardiologist (physician) and Advanced Practice Providers (APPs -  Physician Assistants and Nurse Practitioners) who all work together to provide you with the care you need, when you need it.  We recommend signing up for the patient portal called "MyChart".  Sign up information is provided on this After Visit Summary.  MyChart is used to connect with patients for Virtual Visits (Telemedicine).  Patients are able to view lab/test results, encounter notes, upcoming appointments, etc.  Non-urgent messages can be sent to your provider as well.   To learn more about what you can do with MyChart, go to [email or web address].    Your next appointment:   1 year(s)  The format for your next appointment:   In Person  Provider:   Christen Bame, NP or Richardson Dopp, PA-C     }    Other Instructions

## 2020-12-18 ENCOUNTER — Telehealth: Payer: Self-pay

## 2020-12-18 DIAGNOSIS — I10 Essential (primary) hypertension: Secondary | ICD-10-CM

## 2020-12-18 DIAGNOSIS — R7989 Other specified abnormal findings of blood chemistry: Secondary | ICD-10-CM

## 2020-12-18 DIAGNOSIS — Z79899 Other long term (current) drug therapy: Secondary | ICD-10-CM

## 2020-12-18 LAB — ALT: ALT: 12 IU/L (ref 0–44)

## 2020-12-18 LAB — BASIC METABOLIC PANEL
BUN/Creatinine Ratio: 18 (ref 10–24)
BUN: 47 mg/dL — ABNORMAL HIGH (ref 8–27)
CO2: 20 mmol/L (ref 20–29)
Calcium: 9.9 mg/dL (ref 8.6–10.2)
Chloride: 96 mmol/L (ref 96–106)
Creatinine, Ser: 2.57 mg/dL — ABNORMAL HIGH (ref 0.76–1.27)
Glucose: 180 mg/dL — ABNORMAL HIGH (ref 70–99)
Potassium: 4.8 mmol/L (ref 3.5–5.2)
Sodium: 132 mmol/L — ABNORMAL LOW (ref 134–144)
eGFR: 27 mL/min/{1.73_m2} — ABNORMAL LOW (ref >59)

## 2020-12-18 MED ORDER — LOSARTAN POTASSIUM 25 MG PO TABS: 25.0000 mg | ORAL_TABLET | Freq: Every day | ORAL | 3 refills | Status: DC

## 2020-12-18 NOTE — Telephone Encounter (Signed)
-----   Message from Thayer Headings, MD sent at 12/18/2020 12:59 PM EST ----- DC the Lisinopril - HCTZ  Start Losartan 25 mg a day Cont spironolactone Check BMP in 2 weeks

## 2020-12-18 NOTE — Telephone Encounter (Signed)
Spoke with pt and advised per Dr Acie Fredrickson to stop Lisinopril-HCTZ and begin Losartan 25mg  - 1 tablet by mouth daily.  Continue Spironolactone and recheck BMET in 2 weeks.  Pt verbalizes understanding of all instructions provided and appointment scheduled for BMET on 01/02/2021.  Pt thanked Therapist, sports for the phone call.

## 2021-01-02 ENCOUNTER — Other Ambulatory Visit: Payer: Medicare Other | Admitting: *Deleted

## 2021-01-02 ENCOUNTER — Other Ambulatory Visit: Payer: Self-pay

## 2021-01-02 DIAGNOSIS — R7989 Other specified abnormal findings of blood chemistry: Secondary | ICD-10-CM

## 2021-01-02 DIAGNOSIS — Z79899 Other long term (current) drug therapy: Secondary | ICD-10-CM

## 2021-01-02 DIAGNOSIS — I10 Essential (primary) hypertension: Secondary | ICD-10-CM

## 2021-01-03 LAB — BASIC METABOLIC PANEL
BUN/Creatinine Ratio: 18 (ref 10–24)
BUN: 33 mg/dL — ABNORMAL HIGH (ref 8–27)
CO2: 22 mmol/L (ref 20–29)
Calcium: 10 mg/dL (ref 8.6–10.2)
Chloride: 97 mmol/L (ref 96–106)
Creatinine, Ser: 1.82 mg/dL — ABNORMAL HIGH (ref 0.76–1.27)
Glucose: 170 mg/dL — ABNORMAL HIGH (ref 70–99)
Potassium: 5.1 mmol/L (ref 3.5–5.2)
Sodium: 135 mmol/L (ref 134–144)
eGFR: 40 mL/min/{1.73_m2} — ABNORMAL LOW (ref >59)

## 2021-01-14 ENCOUNTER — Telehealth: Payer: Self-pay | Admitting: Cardiovascular Disease

## 2021-01-14 DIAGNOSIS — I10 Essential (primary) hypertension: Secondary | ICD-10-CM

## 2021-01-14 DIAGNOSIS — Z79899 Other long term (current) drug therapy: Secondary | ICD-10-CM

## 2021-01-14 NOTE — Telephone Encounter (Signed)
Left message for patient to call back  

## 2021-01-14 NOTE — Telephone Encounter (Signed)
Pt returned this nurse's call.  Advised Pt to discontinue lisinopril-HCTZ.  Advised to continue to hold losartan (Pt states chest pain resolved off of losartan).  Advised this nurse would confer with Dr. Acie Fredrickson and call back with BP alternative.

## 2021-01-14 NOTE — Telephone Encounter (Signed)
Pt c/o medication issue:  1. Name of Medication: losartan (COZAAR) 25 MG tablet  2. How are you currently taking this medication (dosage and times per day)? Hasnt taken since Friday or Saturday   3. Are you having a reaction (difficulty breathing--STAT)? Chest pain  4. What is your medication issue? Pt says medication is causing him chest pain

## 2021-01-14 NOTE — Telephone Encounter (Signed)
Patient is returning call.  °

## 2021-01-14 NOTE — Telephone Encounter (Signed)
Patient called and said that he would like to talk with Dr. Curt Bears and his nurse about medication he is taking

## 2021-01-14 NOTE — Telephone Encounter (Signed)
Attempted to call Pt back and left message.  Advised Pt should NOT take lisinopril-HCTZ medication.  Advised to call back to discuss an alternative.

## 2021-01-14 NOTE — Telephone Encounter (Signed)
Dr. Acie Fredrickson Pt.  Duplicate phone message.

## 2021-01-14 NOTE — Telephone Encounter (Signed)
Briefly spoke with Pt.  He has already stopped his losartan and restarted his lisinopril-HCTZ.  (See lab work why lisinopril-HCTZ was stopped)  Pt states he is on the phone with his insurance and requests a call back.

## 2021-01-15 NOTE — Telephone Encounter (Signed)
Patient schedule for labs on 01/22/21

## 2021-01-15 NOTE — Telephone Encounter (Signed)
Attempted phone call to pt.  Per Epic ok to leave voicemail message.  Pt advised per Dr Acie Fredrickson to stop Lisinopril-HCTZ and Losartan.  Pt should call [phone] to schedule lab appointment for 1 week and keep a log of his BP and bring with him to his cardiology appointment.  Call for any further questions.   He should stop the Lisinopril - HCTZ and the losartan  Recheck his BMP in 1 week  He needs to keep a record of his BP and bring to his cardiology visits .   PN

## 2021-01-22 ENCOUNTER — Other Ambulatory Visit: Payer: Self-pay

## 2021-01-22 ENCOUNTER — Other Ambulatory Visit: Payer: Medicare Other | Admitting: *Deleted

## 2021-01-22 DIAGNOSIS — Z79899 Other long term (current) drug therapy: Secondary | ICD-10-CM | POA: Diagnosis not present

## 2021-01-22 DIAGNOSIS — I10 Essential (primary) hypertension: Secondary | ICD-10-CM

## 2021-01-22 LAB — BASIC METABOLIC PANEL
BUN/Creatinine Ratio: 16 (ref 10–24)
BUN: 27 mg/dL (ref 8–27)
CO2: 22 mmol/L (ref 20–29)
Calcium: 10.2 mg/dL (ref 8.6–10.2)
Chloride: 96 mmol/L (ref 96–106)
Creatinine, Ser: 1.72 mg/dL — ABNORMAL HIGH (ref 0.76–1.27)
Glucose: 228 mg/dL — ABNORMAL HIGH (ref 70–99)
Potassium: 4.7 mmol/L (ref 3.5–5.2)
Sodium: 132 mmol/L — ABNORMAL LOW (ref 134–144)
eGFR: 43 mL/min/{1.73_m2} — ABNORMAL LOW (ref >59)

## 2021-02-05 ENCOUNTER — Other Ambulatory Visit: Payer: Self-pay | Admitting: *Deleted

## 2021-02-05 DIAGNOSIS — I10 Essential (primary) hypertension: Secondary | ICD-10-CM

## 2021-02-05 DIAGNOSIS — K2100 Gastro-esophageal reflux disease with esophagitis, without bleeding: Secondary | ICD-10-CM

## 2021-02-05 MED ORDER — PANTOPRAZOLE SODIUM 40 MG PO TBEC: DELAYED_RELEASE_TABLET | ORAL | 1 refills | Status: DC

## 2021-02-05 MED ORDER — SPIRONOLACTONE 25 MG PO TABS: 25.0000 mg | ORAL_TABLET | Freq: Every day | ORAL | 1 refills | Status: DC

## 2021-02-05 MED ORDER — PRAVASTATIN SODIUM 40 MG PO TABS: 40.0000 mg | ORAL_TABLET | Freq: Every day | ORAL | 1 refills | Status: DC

## 2021-02-05 MED ORDER — ISOSORBIDE MONONITRATE ER 30 MG PO TB24: 30.0000 mg | ORAL_TABLET | Freq: Every day | ORAL | 1 refills | Status: DC

## 2021-02-05 MED ORDER — SITAGLIPTIN PHOSPHATE 50 MG PO TABS: 50.0000 mg | ORAL_TABLET | Freq: Every day | ORAL | 1 refills | Status: DC

## 2021-02-05 NOTE — Telephone Encounter (Signed)
Patient called requesting Rx Refill to be sent to [facility]. Stated that he had to change pharmacies due to insurance.   Pended Rx and sent to Dr. Sabra Heck for approval due to [place] Warning.

## 2021-03-17 ENCOUNTER — Other Ambulatory Visit: Payer: Self-pay | Admitting: *Deleted

## 2021-03-17 ENCOUNTER — Other Ambulatory Visit: Payer: Self-pay

## 2021-03-17 DIAGNOSIS — I2510 Atherosclerotic heart disease of native coronary artery without angina pectoris: Secondary | ICD-10-CM

## 2021-03-17 DIAGNOSIS — I482 Chronic atrial fibrillation, unspecified: Secondary | ICD-10-CM

## 2021-03-17 DIAGNOSIS — I10 Essential (primary) hypertension: Secondary | ICD-10-CM

## 2021-03-17 MED ORDER — EMPAGLIFLOZIN 10 MG PO TABS: 10.0000 mg | ORAL_TABLET | Freq: Every day | ORAL | 1 refills | Status: DC

## 2021-03-17 MED ORDER — APIXABAN 5 MG PO TABS: 5.0000 mg | ORAL_TABLET | Freq: Two times a day (BID) | ORAL | 1 refills | Status: DC

## 2021-03-17 MED ORDER — CARVEDILOL 25 MG PO TABS: 25.0000 mg | ORAL_TABLET | Freq: Two times a day (BID) | ORAL | 1 refills | Status: DC

## 2021-03-17 MED ORDER — OXYBUTYNIN CHLORIDE ER 5 MG PO TB24: 5.0000 mg | ORAL_TABLET | Freq: Every day | ORAL | 1 refills | Status: DC

## 2021-03-17 NOTE — Telephone Encounter (Signed)
Prescription refill request for Eliquis received. ?Indication: afib  ?Last office visit: nahser, 12/17/2020 ?Scr: 1.72, 01/22/2021 ?Age: 66 yo  ?Weight: 102.2 kg  ? ? ?Refill sent.  ?

## 2021-03-17 NOTE — Telephone Encounter (Signed)
Patient requested refills.  ?Patient requested to be sent to [facility].  ?

## 2021-03-17 NOTE — Telephone Encounter (Signed)
Pt calling requesting a refill on Eliquis be sent to [facility]. Please address ?

## 2021-03-25 ENCOUNTER — Ambulatory Visit (INDEPENDENT_AMBULATORY_CARE_PROVIDER_SITE_OTHER): Payer: Medicare Other | Admitting: Family Medicine

## 2021-03-25 ENCOUNTER — Encounter: Payer: Self-pay | Admitting: Family Medicine

## 2021-03-25 ENCOUNTER — Other Ambulatory Visit: Payer: Self-pay

## 2021-03-25 VITALS — BP 124/76 | HR 83 | Temp 97.1°F | Ht 71.0 in | Wt 221.0 lb

## 2021-03-25 DIAGNOSIS — N1831 Chronic kidney disease, stage 3a: Secondary | ICD-10-CM | POA: Diagnosis not present

## 2021-03-25 DIAGNOSIS — E0821 Diabetes mellitus due to underlying condition with diabetic nephropathy: Secondary | ICD-10-CM | POA: Insufficient documentation

## 2021-03-25 DIAGNOSIS — I10 Essential (primary) hypertension: Secondary | ICD-10-CM

## 2021-03-25 DIAGNOSIS — I4821 Permanent atrial fibrillation: Secondary | ICD-10-CM | POA: Diagnosis not present

## 2021-03-25 DIAGNOSIS — E7800 Pure hypercholesterolemia, unspecified: Secondary | ICD-10-CM

## 2021-03-25 DIAGNOSIS — E1121 Type 2 diabetes mellitus with diabetic nephropathy: Secondary | ICD-10-CM

## 2021-03-25 NOTE — Progress Notes (Signed)
? ? ?Provider:  ?Alain Honey, MD ? ?Careteam: ?Patient Care Team: ?Wardell Honour, MD as PCP - General (Family Medicine) ?Nahser, Wonda Cheng, MD as PCP - Cardiology (Cardiology) ?Anda Kraft, MD (Endocrinology) ?Bernell List, CPhT as [employer], corporate) ? ?PLACE OF SERVICE:  ?[facility]  ?Advanced Directive information ?Does Patient Have a Medical Advance Directive?: No, Would patient like information on creating a medical advance directive?: No - Patient declined ? ?No Known Allergies ? ?Chief Complaint  ?Patient presents with  ? Medical Management of Chronic Issues  ?  Routine visit. Discuss need for covid boosters, td/tdap, shingrix, PCV, and colonoscopy or postpone/exclude if patient refuses.   ? ? ? ?HPI: Patient is a 66 y.o. male patient is here for routine follow-up of diabetes hypertension and lipids.  He has had a stroke in the past and now has some issues with balance requiring him to use a walker around the house in a wheelchair outside the house. ?For his diabetes he takes metformin once a day along with Jardiance and Januvia.  Most recent A1c was 7.7. ?He has no specific complaints or concerns today. ?He has seen urologist and started on Flomax about 2 months ago ? ?Review of Systems:  ?Review of Systems  ?Constitutional: Negative.   ?HENT: Negative.    ?Respiratory: Negative.    ?Cardiovascular: Negative.   ?Genitourinary:  Positive for frequency and urgency.  ?Neurological: Negative.   ?Psychiatric/Behavioral: Negative.    ?All other systems reviewed and are negative. ? ?Past Medical History:  ?Diagnosis Date  ? A-fib ([place]) 11/19/2010  ? Acute exacerbation of congestive heart failure ([place]) 11/19/2010  ? Arthritis   ? CAD (coronary artery disease) 11/20/2010  ? CHF (congestive heart failure) ([place])   ? CKD (chronic kidney disease) stage 3, GFR 30-59 ml/min ([facility]) 05/04/2017  ? Coronary artery disease   ? Diabetes type 2, controlled ([place])  11/19/2010  ? GERD (gastroesophageal reflux disease) 02/08/2015  ? Gout   ? Gout 11/20/2010  ? History of cerebrovascular accident (CVA) with residual deficit 05/04/2017  ? HTN (hypertension) 07/01/2012  ? Hyperlipemia 11/21/2010  ? Hypertension   ? Hypertensive emergency 11/19/2010  ? ICH (intracerebral hemorrhage) ([place]) 12/22/2010  ? Physical deconditioning 12/22/2010  ? Pulmonary edema 11/19/2010  ? Respiratory failure ([place]) 11/19/2010  ? Shortness of breath   ? Stroke ([place]) 11/19/2010  ? Thyroiditis 11/20/2010  ? ?Past Surgical History:  ?Procedure Laterality Date  ? PEG PLACEMENT  12/03/2010  ? Procedure: PERCUTANEOUS ENDOSCOPIC GASTROSTOMY (PEG) PLACEMENT;  Surgeon: Lafayette Dragon, MD;  Location: [facility] END[facility]COPY;  Service: Endoscopy;  Laterality: N/A;  ? TRACHEOSTOMY TUBE PLACEMENT  11/28/2010  ? Procedure: TRACHEOSTOMY;  Surgeon: Beckie Salts, MD;  Location: [place] [facility];  Service: ENT;  Laterality: N/A;  ? ?Social History: ?  reports that he quit smoking about 15 years ago. His smoking use included cigarettes. He has never used smokeless tobacco. He reports that he does not drink alcohol and does not use drugs. ? ?Family History  ?Problem Relation Age of Onset  ? Diabetes Mother   ? Diabetes Brother   ? Colon cancer Neg Hx   ? Colon polyps Neg Hx   ? Kidney disease Neg Hx   ? Esophageal cancer Neg Hx   ? Heart disease Neg Hx   ? Gallbladder disease Neg Hx   ? ? ?Medications: ?Patient's Medications  ?New Prescriptions  ? No medications on file  ?Previous Medications  ?  ALCOHOL SWABS (ALCOHOL PREP) 70 % PADS      ? APIXABAN (ELIQUIS) 5 MG TABS TABLET    Take 1 tablet (5 mg total) by mouth 2 (two) times daily.  ? BLOOD GLUCOSE MONITORING SUPPL (ONETOUCH VERIO) W/DEVICE KIT    Use as directed to test blood sugar once daily  ? CARVEDILOL (COREG) 25 MG TABLET    Take 1 tablet (25 mg total) by mouth 2 (two) times daily with a meal.  ? DILTIAZEM (CARDIZEM CD) 180 MG 24 HR CAPSULE    TAKE 2 CAPSULES BY MOUTH  DAILY AT NOON  ?  EMPAGLIFLOZIN (JARDIANCE) 10 MG TABS TABLET    Take 1 tablet (10 mg total) by mouth daily.  ? FUROSEMIDE (LASIX) 40 MG TABLET    Take 40 mg by mouth as needed.  ? GLUCOSE BLOOD (ONETOUCH VERIO) TEST STRIP    Use as directed to test blood sugar once daily  ? ISOSORBIDE MONONITRATE (IMDUR) 30 MG 24 HR TABLET    Take 1 tablet (30 mg total) by mouth daily.  ? LOSARTAN (COZAAR) 25 MG TABLET    Take 1 tablet (25 mg total) by mouth daily.  ? METFORMIN (GLUCOPHAGE) 850 MG TABLET    Take 850 mg by mouth daily with breakfast.  ? MULTIPLE VITAMINS-MINERALS (MENS ONE DAILY PO)    Take 1 tablet by mouth daily.  ? ONETOUCH DELICA LANCETS 39Q MISC    Use as directed to test blood sugar once daily  ? OXYBUTYNIN (DITROPAN-XL) 5 MG 24 HR TABLET    Take 1 tablet (5 mg total) by mouth at bedtime.  ? PANTOPRAZOLE (PROTONIX) 40 MG TABLET    TAKE 1 TABLET BY MOUTH  DAILY AT 12 NOON.  ? PRAVASTATIN (PRAVACHOL) 40 MG TABLET    Take 1 tablet (40 mg total) by mouth daily.  ? SITAGLIPTIN (JANUVIA) 50 MG TABLET    Take 1 tablet (50 mg total) by mouth daily.  ? SPIRONOLACTONE (ALDACTONE) 25 MG TABLET    Take 1 tablet (25 mg total) by mouth daily.  ? TAMSULOSIN (FLOMAX) 0.4 MG CAPS CAPSULE    Take 0.4 mg by mouth daily.  ?Modified Medications  ? No medications on file  ?Discontinued Medications  ? No medications on file  ? ? ?Physical Exam: ? ?Vitals:  ? 03/25/21 1036  ?BP: 124/76  ?Pulse: 83  ?Temp: (!) 97.1 ?F (36.2 ?C)  ?TempSrc: Temporal  ?SpO2: 97%  ?Weight: 221 lb (100.2 kg)  ?Height: _0  (1.803 m)  ? ?Body mass index is 30.82 kg/m?. ?Wt Readings from Last 3 Encounters:  ?03/25/21 221 lb (100.2 kg)  ?12/17/20 225 lb 3.2 oz (102.2 kg)  ?11/28/20 225 lb (102.1 kg)  ? ? ?Physical Exam ?Vitals and nursing note reviewed.  ?Constitutional:   ?   Appearance: Normal appearance. He is obese.  ?HENT:  ?   Head: Normocephalic.  ?Cardiovascular:  ?   Rate and Rhythm: Normal rate and regular rhythm.  ?Pulmonary:  ?   Effort: Pulmonary effort is  normal.  ?   Breath sounds: Normal breath sounds.  ?Neurological:  ?   General: No focal deficit present.  ?   Mental Status: He is alert and oriented to person, place, and time.  ?Psychiatric:     ?   Mood and Affect: Mood normal.     ?   Behavior: Behavior normal.  ? ? ?Labs reviewed: ?Basic Metabolic Panel: ?Recent Labs  ?  12/17/20 ?1146 01/02/21 ?1329  01/22/21 ?1145  ?NA 132* 135 132*  ?K 4.8 5.1 4.7  ?CL 96 97 96  ?CO2 _0 ?GLUCOSE 180* 170* 228*  ?BUN 47* 33* 27  ?CREATININE 2.57* 1.82* 1.72*  ?CALCIUM 9.9 10.0 10.2  ? ?Liver Function Tests: ?Recent Labs  ?  12/17/20 ?1146  ?ALT 12  ? ?No results for input(s): LIPASE, AMYLASE in the last 8760 hours. ?No results for input(s): AMMONIA in the last 8760 hours. ?CBC: ?No results for input(s): WBC, NEUTROABS, HGB, HCT, MCV, PLT in the last 8760 hours. ?Lipid Panel: ?Recent Labs  ?  11/19/20 ?1105  ?CHOL 152  ?HDL 50  ?[place] 64  ?TRIG 287*  ?CHOLHDL 3.0  ? ?TSH: ?No results for input(s): TSH in the last 8760 hours. ?A1C: ?Lab Results  ?Component Value Date  ? HGBA1C 7.7 (H) 11/19/2020  ? ? ? ?Assessment/Plan ? ?1. Controlled type 2 diabetes mellitus with diabetic nephropathy, without long-term current use of insulin ([place]) ?Continue on current 3 drug regimen and will check A1c today ? ?2. Stage 3a chronic kidney disease ([place]) ?Renal function is stable for now we will repeat labs when on his return in 3 months ? ?3. Diabetes due to underlying condition w diabetic nephropathy ([place]) ?Last A1c was 7.7 need to update since it has been 4 months ? ?4. Permanent atrial fibrillation ?Seems to be in sinus rhythm today.  He is on Coreg and diltiazem for rate control ? ?5. Pure hypercholesterolemia ?Lipids are at goal with LDL of 64 3 months ago ? ?6. Primary hypertension ?Blood pressure is good; 124/70 ? ? ?Alain Honey, MD ?[facility] ?[phone]  ? ?

## 2021-03-25 NOTE — Patient Instructions (Signed)
Continue to watch your diet to minimize carbs and sweets ?

## 2021-03-26 LAB — HEMOGLOBIN A1C
Hgb A1c MFr Bld: 7.6 % of total Hgb — ABNORMAL HIGH (ref <5.7)
Mean Plasma Glucose: 171 mg/dL
eAG (mmol/L): 9.5 mmol/L

## 2021-03-27 ENCOUNTER — Inpatient Hospital Stay: Payer: Medicare Other | Attending: Oncology

## 2021-04-03 ENCOUNTER — Inpatient Hospital Stay: Payer: Medicare Other | Admitting: Oncology

## 2021-05-06 ENCOUNTER — Inpatient Hospital Stay: Payer: Medicare Other | Admitting: Oncology

## 2021-05-06 ENCOUNTER — Inpatient Hospital Stay: Payer: Medicare Other | Attending: Oncology

## 2021-05-06 ENCOUNTER — Telehealth: Payer: Self-pay | Admitting: *Deleted

## 2021-05-06 NOTE — Telephone Encounter (Signed)
PC to patient regarding today's missed appointments, no answer, left VM - instructed patient to call the scheduling department if he wants to reschedule his appointments, [phone]. ?

## 2021-05-18 ENCOUNTER — Encounter (HOSPITAL_COMMUNITY): Payer: Self-pay

## 2021-05-18 ENCOUNTER — Ambulatory Visit (HOSPITAL_COMMUNITY)
Admission: EM | Admit: 2021-05-18 | Discharge: 2021-05-18 | Disposition: A | Discharge status: Home / Self Care | Payer: Medicare Other | Attending: Emergency Medicine | Admitting: Emergency Medicine

## 2021-05-18 DIAGNOSIS — S61012A Laceration without foreign body of left thumb without damage to nail, initial encounter: Secondary | ICD-10-CM

## 2021-05-18 MED ORDER — LIDOCAINE-EPINEPHRINE 1 %-1:100000 IJ SOLN
INTRAMUSCULAR | Status: AC
  Filled 2021-05-18: qty 1

## 2021-05-18 MED ORDER — CEPHALEXIN 500 MG PO CAPS: 500.0000 mg | ORAL_CAPSULE | Freq: Four times a day (QID) | ORAL | 0 refills | Status: AC

## 2021-05-18 NOTE — ED Triage Notes (Signed)
Pt reports cutting left  thumb finger this morning. He was opening up a can. ?

## 2021-05-18 NOTE — ED Provider Notes (Signed)
?[facility] ? ? ? ?CSN: [id number] ?Arrival date & time: 05/18/21  1006 ?  ? ?HISTORY  ? ?Chief Complaint  ?Patient presents with  ? Laceration  ? ?HPI ?[name] is a 66 y.o. male. Patient presents to urgent care about an hour after cutting his left great thumb on the lid of a can of food.  Patient states he is predominantly right-handed since his stroke and uses his left hand to do a lot of things such as opening cans.  Patient states he lives alone.  Patient states he takes Eliquis for history of stroke, states he is still having a lot of bleeding.  Patient denies significant pain in the finger.  Patient demonstrates full range of motion of the finger.  Patient denies loss of sensation in his finger. ? ?The history is provided by the patient.  ?Past Medical History:  ?Diagnosis Date  ? A-fib ([place]) 11/19/2010  ? Acute exacerbation of congestive heart failure ([place]) 11/19/2010  ? Arthritis   ? CAD (coronary artery disease) 11/20/2010  ? CHF (congestive heart failure) ([place])   ? CKD (chronic kidney disease) stage 3, GFR 30-59 ml/min (HCC) 05/04/2017  ? Coronary artery disease   ? Diabetes type 2, controlled ([place]) 11/19/2010  ? GERD (gastroesophageal reflux disease) 02/08/2015  ? Gout   ? Gout 11/20/2010  ? History of cerebrovascular accident (CVA) with residual deficit 05/04/2017  ? HTN (hypertension) 07/01/2012  ? Hyperlipemia 11/21/2010  ? Hypertension   ? Hypertensive emergency 11/19/2010  ? ICH (intracerebral hemorrhage) ([place]) 12/22/2010  ? Physical deconditioning 12/22/2010  ? Pulmonary edema 11/19/2010  ? Respiratory failure ([place]) 11/19/2010  ? Shortness of breath   ? Stroke ([place]) 11/19/2010  ? Thyroiditis 11/20/2010  ? ?Patient Active Problem List  ? Diagnosis Date Noted  ? Diabetes due to underlying condition w diabetic nephropathy ([place]) 03/25/2021  ? Chronic diastolic CHF (congestive heart failure) ([place]) 10/28/2017  ? History of cerebrovascular accident (CVA) with residual deficit 05/04/2017   ? CKD (chronic kidney disease) stage 3, GFR 30-59 ml/min (HCC) 05/04/2017  ? GERD (gastroesophageal reflux disease) 02/08/2015  ? HTN (hypertension) 07/01/2012  ? ICH (intracerebral hemorrhage) ([place]) 12/22/2010  ? Hyperlipemia 11/21/2010  ? Gout 11/20/2010  ? CAD (coronary artery disease) 11/20/2010  ? Stroke ([place]) 11/19/2010  ? Permanent atrial fibrillation 11/19/2010  ? ?Past Surgical History:  ?Procedure Laterality Date  ? PEG PLACEMENT  12/03/2010  ? Procedure: PERCUTANEOUS ENDOSCOPIC GASTROSTOMY (PEG) PLACEMENT;  Surgeon: Lafayette Dragon, MD;  Location: [facility] END[facility]COPY;  Service: Endoscopy;  Laterality: N/A;  ? TRACHEOSTOMY TUBE PLACEMENT  11/28/2010  ? Procedure: TRACHEOSTOMY;  Surgeon: Beckie Salts, MD;  Location: [facility];  Service: ENT;  Laterality: N/A;  ? ? ?Home Medications   ? ?Prior to Admission medications   ?Medication Sig Start Date End Date Taking? Authorizing Provider  ?Alcohol Swabs (ALCOHOL PREP) 70 % PADS  02/20/14   [provider]  ?apixaban (ELIQUIS) 5 MG TABS tablet Take 1 tablet (5 mg total) by mouth 2 (two) times daily. 03/17/21   Nahser, Wonda Cheng, MD  ?Blood Glucose Monitoring Suppl [facility] VERIO) w/Device KIT Use as directed to test blood sugar once daily 12/06/17   Ladell Pier, MD  ?carvedilol (COREG) 25 MG tablet Take 1 tablet (25 mg total) by mouth 2 (two) times daily with a meal. 03/17/21   Wardell Honour, MD  ?diltiazem (CARDIZEM CD) 180 MG 24 hr capsule TAKE 2 CAPSULES BY MOUTH  DAILY  AT NOON 05/02/20   Nahser, Wonda Cheng, MD  ?empagliflozin (JARDIANCE) 10 MG TABS tablet Take 1 tablet (10 mg total) by mouth daily. 03/17/21   Wardell Honour, MD  ?furosemide (LASIX) 40 MG tablet Take 40 mg by mouth as needed. 09/22/18   [provider]  ?glucose blood (ONETOUCH VERIO) test strip Use as directed to test blood sugar once daily 12/06/17   Ladell Pier, MD  ?isosorbide mononitrate (IMDUR) 30 MG 24 hr tablet Take 1 tablet (30 mg total) by mouth daily. 02/05/21    Wardell Honour, MD  ?losartan (COZAAR) 25 MG tablet Take 1 tablet (25 mg total) by mouth daily. 12/18/20   Nahser, Wonda Cheng, MD  ?metFORMIN (GLUCOPHAGE) 850 MG tablet Take 850 mg by mouth daily with breakfast.    [provider]  ?Multiple Vitamins-Minerals (MENS ONE DAILY PO) Take 1 tablet by mouth daily.    [provider]  ?Jonetta Speak LANCETS 70V MISC Use as directed to test blood sugar once daily 12/06/17   Ladell Pier, MD  ?oxybutynin (DITROPAN-XL) 5 MG 24 hr tablet Take 1 tablet (5 mg total) by mouth at bedtime. 03/17/21   Wardell Honour, MD  ?pantoprazole (PROTONIX) 40 MG tablet TAKE 1 TABLET BY MOUTH  DAILY AT 12 NOON. 02/05/21   Wardell Honour, MD  ?pravastatin (PRAVACHOL) 40 MG tablet Take 1 tablet (40 mg total) by mouth daily. 02/05/21   Wardell Honour, MD  ?sitaGLIPtin (JANUVIA) 50 MG tablet Take 1 tablet (50 mg total) by mouth daily. 02/05/21   Wardell Honour, MD  ?spironolactone (ALDACTONE) 25 MG tablet Take 1 tablet (25 mg total) by mouth daily. 02/05/21   Wardell Honour, MD  ?tamsulosin (FLOMAX) 0.4 MG CAPS capsule Take 0.4 mg by mouth daily.    [provider]  ? ? ?Family History ?Family History  ?Problem Relation Age of Onset  ? Diabetes Mother   ? Diabetes Brother   ? Colon cancer Neg Hx   ? Colon polyps Neg Hx   ? Kidney disease Neg Hx   ? Esophageal cancer Neg Hx   ? Heart disease Neg Hx   ? Gallbladder disease Neg Hx   ? ?Social History ?Social History  ? ?Tobacco Use  ? Smoking status: Former  ?  Types: Cigarettes  ?  Quit date: 12/19/2005  ?  Years since quitting: 15.4  ? Smokeless tobacco: Never  ?Vaping Use  ? Vaping Use: Never used  ?Substance Use Topics  ? Alcohol use: No  ?  Alcohol/week: 11.0 standard drinks  ?  Types: 6 Glasses of wine, 5 Shots of liquor per week  ?  Comment: quit in 2012  ? Drug use: No  ? ?Allergies   ?Patient has no known allergies. ? ?Review of Systems ?Review of Systems ?Pertinent findings noted in history of  present illness.  ? ?Physical Exam ?Triage Vital Signs ?ED Triage Vitals  ?Enc Vitals Group  ?   BP 11/08/20 0827 (!) 147/82  ?   Pulse Rate 11/08/20 0827 72  ?   Resp 11/08/20 0827 18  ?   Temp 11/08/20 0827 98.3 ?F (36.8 ?C)  ?   Temp Source 11/08/20 0827 Oral  ?   SpO2 11/08/20 0827 98 %  ?   Weight --   ?   Height --   ?   Head Circumference --   ?   Peak Flow --   ?   Pain  Score 11/08/20 0826 5  ?   Pain Loc --   ?   Pain Edu? --   ?   Excl. in [place]? --   ?No data found. ? ?Updated Vital Signs ?BP (!) 160/96 (BP Location: Left Arm)   Pulse 67   Temp 98.3 ?F (36.8 ?C) (Oral)   Resp 18   SpO2 100%  ? ?Physical Exam ?Vitals and nursing note reviewed.  ?Constitutional:   ?   General: He is not in acute distress. ?   Appearance: Normal appearance. He is not ill-appearing.  ?HENT:  ?   Head: Normocephalic and atraumatic.  ?Eyes:  ?   General: Lids are normal.     ?   Right eye: No discharge.     ?   Left eye: No discharge.  ?   Extraocular Movements: Extraocular movements intact.  ?   Conjunctiva/sclera: Conjunctivae normal.  ?   Right eye: Right conjunctiva is not injected.  ?   Left eye: Left conjunctiva is not injected.  ?Neck:  ?   Trachea: Trachea and phonation normal.  ?Cardiovascular:  ?   Rate and Rhythm: Normal rate and regular rhythm.  ?   Pulses: Normal pulses.  ?   Heart sounds: Normal heart sounds. No murmur heard. ?  No friction rub. No gallop.  ?Pulmonary:  ?   Effort: Pulmonary effort is normal. No accessory muscle usage, prolonged expiration or respiratory distress.  ?   Breath sounds: Normal breath sounds. No stridor, decreased air movement or transmitted upper airway sounds. No decreased breath sounds, wheezing, rhonchi or rales.  ?Chest:  ?   Chest wall: No tenderness.  ?Musculoskeletal:     ?   General: Normal range of motion.  ?   Cervical back: Normal range of motion and neck supple. Normal range of motion.  ?Lymphadenopathy:  ?   Cervical: No cervical adenopathy.  ?Skin: ?   General: Skin is  warm and dry.  ?   Capillary Refill: Capillary refill takes less than 2 seconds.  ?   Findings: Signs of injury (1.5 cm laceration across the anterior aspect of the distal thumb with significant bright red blood.)

## 2021-05-18 NOTE — Discharge Instructions (Addendum)
Please read the enclosed instructions regarding how to care for your sutured wound. ? ?Please begin Keflex 1 capsule 4 times daily for the next 5 days to prevent infection from developing in the wound.  Please contact us immediately if you see any signs of wound infection such as increased pain of her thumb, swelling of the thumb, purulent drainage from the wound. ? ?Please return in 10 days to have your sutures removed, May 28, 2021.  You are welcome to return to this urgent care, visit me at the [facility]. urgent care or see your primary care provider. ? ?Thank you for visiting urgent care today.  We appreciate the opportunity to participate in your care. ?

## 2021-05-28 ENCOUNTER — Ambulatory Visit (HOSPITAL_COMMUNITY)
Admission: EM | Admit: 2021-05-28 | Discharge: 2021-05-28 | Disposition: A | Discharge status: Home / Self Care | Payer: Medicare Other | Attending: Internal Medicine | Admitting: Internal Medicine

## 2021-05-28 NOTE — ED Triage Notes (Signed)
Removed 4 sutures from lt thumb. No s/sx's of infection. ?

## 2021-05-30 ENCOUNTER — Other Ambulatory Visit: Payer: Self-pay | Admitting: *Deleted

## 2021-05-30 MED ORDER — METFORMIN HCL 850 MG PO TABS: 850.0000 mg | ORAL_TABLET | Freq: Every day | ORAL | 1 refills | Status: DC

## 2021-05-30 NOTE — Telephone Encounter (Signed)
Patient requested refill to [facility].

## 2021-06-02 ENCOUNTER — Other Ambulatory Visit: Payer: Self-pay

## 2021-06-02 DIAGNOSIS — I482 Chronic atrial fibrillation, unspecified: Secondary | ICD-10-CM

## 2021-06-02 MED ORDER — DILTIAZEM HCL ER COATED BEADS 180 MG PO CP24: ORAL_CAPSULE | ORAL | 1 refills | Status: DC

## 2021-06-03 ENCOUNTER — Other Ambulatory Visit: Payer: Self-pay

## 2021-06-03 DIAGNOSIS — I482 Chronic atrial fibrillation, unspecified: Secondary | ICD-10-CM

## 2021-06-03 MED ORDER — DILTIAZEM HCL ER COATED BEADS 180 MG PO CP24: ORAL_CAPSULE | ORAL | 2 refills | Status: DC

## 2021-06-03 NOTE — Telephone Encounter (Signed)
Pt's medication was sent to pt's pharmacy as requested. Confirmation received.  °

## 2021-06-11 ENCOUNTER — Encounter: Payer: Self-pay | Admitting: Podiatry

## 2021-06-11 ENCOUNTER — Ambulatory Visit (INDEPENDENT_AMBULATORY_CARE_PROVIDER_SITE_OTHER): Payer: Medicare Other | Admitting: Podiatry

## 2021-06-11 DIAGNOSIS — N183 Chronic kidney disease, stage 3 unspecified: Secondary | ICD-10-CM | POA: Diagnosis not present

## 2021-06-11 DIAGNOSIS — M79674 Pain in right toe(s): Secondary | ICD-10-CM

## 2021-06-11 DIAGNOSIS — M79675 Pain in left toe(s): Secondary | ICD-10-CM

## 2021-06-11 DIAGNOSIS — E0822 Diabetes mellitus due to underlying condition with diabetic chronic kidney disease: Secondary | ICD-10-CM

## 2021-06-11 DIAGNOSIS — B351 Tinea unguium: Secondary | ICD-10-CM

## 2021-06-11 DIAGNOSIS — Z794 Long term (current) use of insulin: Secondary | ICD-10-CM

## 2021-06-11 NOTE — Progress Notes (Signed)
This patient returns to my office for at risk foot care.  This patient requires this care by a professional since this patient will be at risk due to having CKD, and diabetes and CVA.  This patient is unable to cut nails himself since the patient cannot reach his nails.These nails are painful walking and wearing shoes.  This patient presents for at risk foot care today.  General Appearance  Alert, conversant and in no acute stress.  Vascular  Dorsalis pedis and posterior tibial  pulses are palpable  right foot.  Weak  DP and PT left due to CVA.  Capillary return is within normal limits  bilaterally. Temperature is within normal limits  bilaterally.  Venous stasis.  Neurologic  Senn-Weinstein monofilament wire test within normal limits  bilaterally. Muscle power within normal limits bilaterally.  Nails Thick disfigured discolored nails with subungual debris  from hallux to fifth toes bilaterally. No evidence of bacterial infection or drainage bilaterally.  Orthopedic  No limitations of motion  feet .  No crepitus or effusions noted.  No bony pathology or digital deformities noted.  Skin  normotropic skin with no porokeratosis noted bilaterally.  No signs of infections or ulcers noted.     Onychomycosis  Pain in right toes  Pain in left toes  Consent was obtained for treatment procedures.   Mechanical debridement of nails 1-5  bilaterally performed with a nail nipper.  Filed with dremel without incident.    Return office visit   4 months                   Told patient to return for periodic foot care and evaluation due to potential at risk complications.   Gardiner Barefoot DPM

## 2021-06-20 ENCOUNTER — Telehealth: Payer: Self-pay | Admitting: *Deleted

## 2021-06-20 NOTE — Telephone Encounter (Signed)
   Pre-operative Risk Assessment    Patient Name: [name]  DOB: 04/24/1955 MRN: [id number]     Request for Surgical Clearance    Procedure:   DEEP PERIODONTAL CLEANING   Date of Surgery:  Clearance TBD                                 Surgeon:  Surgeon's Group or Practice Name:  [facility]  Phone number:  [phone] Fax number:  [phone]   Type of Clearance Requested:   - Medical  - Pharmacy:  Hold Apixaban (Eliquis)     Type of Anesthesia:  Not Indicated   Additional requests/questions:    Jiles Prows   06/20/2021, 10:18 AM

## 2021-06-23 NOTE — Telephone Encounter (Signed)
   Primary Cardiologist: Mertie Moores, MD  Chart reviewed as part of pre-operative protocol coverage. Simple dental extractions are considered low risk procedures per guidelines and generally do not require any specific cardiac clearance. It is also generally accepted that for simple extractions and dental cleanings, there is no need to interrupt blood thinner therapy.   SBE prophylaxis is not required for the patient.  I will route this recommendation to the requesting party via Epic fax function and remove from pre-op pool.  Please call with questions.  Emmaline Life, NP-C    06/23/2021, 12:11 PM [facility] [address] Office [phone] Fax [phone]

## 2021-07-08 ENCOUNTER — Other Ambulatory Visit: Payer: Self-pay | Admitting: *Deleted

## 2021-07-08 MED ORDER — ONETOUCH ULTRA VI STRP: ORAL_STRIP | 2 refills | Status: DC

## 2021-07-08 NOTE — Telephone Encounter (Signed)
[facility] requested refill.

## 2021-07-14 ENCOUNTER — Telehealth: Payer: Self-pay

## 2021-07-14 DIAGNOSIS — E1121 Type 2 diabetes mellitus with diabetic nephropathy: Secondary | ICD-10-CM

## 2021-07-14 DIAGNOSIS — E0821 Diabetes mellitus due to underlying condition with diabetic nephropathy: Secondary | ICD-10-CM

## 2021-07-14 MED ORDER — BLOOD GLUCOSE METER KIT: PACK | 0 refills | Status: AC

## 2021-07-14 NOTE — Telephone Encounter (Signed)
Patient called stated he need a meter send in to pharmacy that is compatible with the onetouch ultra test strips that was send into his mail order pharmacy. Order for the meter was send into the pharmacy per patients request.

## 2021-08-15 ENCOUNTER — Telehealth: Payer: Self-pay

## 2021-08-15 ENCOUNTER — Other Ambulatory Visit: Payer: Self-pay

## 2021-08-15 DIAGNOSIS — I10 Essential (primary) hypertension: Secondary | ICD-10-CM

## 2021-08-15 MED ORDER — PRAVASTATIN SODIUM 40 MG PO TABS: 40.0000 mg | ORAL_TABLET | Freq: Every day | ORAL | 1 refills | Status: DC

## 2021-08-15 MED ORDER — SPIRONOLACTONE 25 MG PO TABS: 25.0000 mg | ORAL_TABLET | Freq: Every day | ORAL | 1 refills | Status: DC

## 2021-08-15 NOTE — Telephone Encounter (Signed)
Refill request sent by pharmacy

## 2021-08-15 NOTE — Telephone Encounter (Signed)
Refill request received from pharmacy. High warning came up when refilling medication

## 2021-08-29 ENCOUNTER — Other Ambulatory Visit: Payer: Self-pay | Admitting: *Deleted

## 2021-08-29 DIAGNOSIS — K2100 Gastro-esophageal reflux disease with esophagitis, without bleeding: Secondary | ICD-10-CM

## 2021-08-29 MED ORDER — PANTOPRAZOLE SODIUM 40 MG PO TBEC: DELAYED_RELEASE_TABLET | ORAL | 0 refills | Status: DC

## 2021-08-29 NOTE — Telephone Encounter (Signed)
Pharmacy requested refill.  Needs appointment before anymore future refills.  

## 2021-09-11 ENCOUNTER — Other Ambulatory Visit: Payer: Self-pay | Admitting: *Deleted

## 2021-09-11 MED ORDER — ALCOHOL PREP 70 % PADS: MEDICATED_PAD | 3 refills | Status: DC

## 2021-09-11 NOTE — Telephone Encounter (Signed)
[facility] requested refill.

## 2021-09-26 ENCOUNTER — Other Ambulatory Visit: Payer: Self-pay | Admitting: *Deleted

## 2021-09-26 DIAGNOSIS — I482 Chronic atrial fibrillation, unspecified: Secondary | ICD-10-CM

## 2021-09-26 MED ORDER — APIXABAN 5 MG PO TABS: 5.0000 mg | ORAL_TABLET | Freq: Two times a day (BID) | ORAL | 1 refills | Status: DC

## 2021-09-26 NOTE — Telephone Encounter (Signed)
Eliquis '5mg'$  refill request received. Patient is 66 years old, weight-100.2kg, Crea-1.72 on 01/22/2021, Diagnosis-Afib, and last seen by Dr. Acie Fredrickson on 12/17/2020. Dose is appropriate based on dosing criteria. Will send in refill to requested pharmacy.

## 2021-10-17 ENCOUNTER — Telehealth: Payer: Self-pay

## 2021-10-17 MED ORDER — SITAGLIPTIN PHOSPHATE 50 MG PO TABS: 50.0000 mg | ORAL_TABLET | Freq: Every day | ORAL | 1 refills | Status: DC

## 2021-10-17 MED ORDER — ISOSORBIDE MONONITRATE ER 30 MG PO TB24: 30.0000 mg | ORAL_TABLET | Freq: Every day | ORAL | 1 refills | Status: DC

## 2021-10-17 NOTE — Telephone Encounter (Signed)
Received refill request from pharmacy and called patient to schedule follow up visit before refilling medication.

## 2021-10-17 NOTE — Telephone Encounter (Signed)
Patient returned call and appointment scheduled. Medications sent to pharmacy.

## 2021-11-07 ENCOUNTER — Telehealth: Payer: Self-pay

## 2021-11-07 DIAGNOSIS — I10 Essential (primary) hypertension: Secondary | ICD-10-CM

## 2021-11-07 DIAGNOSIS — I2510 Atherosclerotic heart disease of native coronary artery without angina pectoris: Secondary | ICD-10-CM

## 2021-11-07 MED ORDER — METFORMIN HCL 850 MG PO TABS: 850.0000 mg | ORAL_TABLET | Freq: Every day | ORAL | 0 refills | Status: DC

## 2021-11-07 MED ORDER — EMPAGLIFLOZIN 10 MG PO TABS: 10.0000 mg | ORAL_TABLET | Freq: Every day | ORAL | 0 refills | Status: DC

## 2021-11-07 MED ORDER — CARVEDILOL 25 MG PO TABS: 25.0000 mg | ORAL_TABLET | Freq: Two times a day (BID) | ORAL | 0 refills | Status: DC

## 2021-11-07 NOTE — Telephone Encounter (Signed)
Pharmacy send a rx request for Metformin, [name], Carvedolol for patient and medication was send into pharmacy.

## 2021-11-12 ENCOUNTER — Ambulatory Visit: Payer: Medicare Other | Admitting: Podiatry

## 2021-11-26 ENCOUNTER — Encounter: Payer: Self-pay | Admitting: Family Medicine

## 2021-11-26 ENCOUNTER — Ambulatory Visit (INDEPENDENT_AMBULATORY_CARE_PROVIDER_SITE_OTHER): Payer: Medicare Other | Admitting: Family Medicine

## 2021-11-26 VITALS — BP 118/72 | HR 87 | Temp 97.9°F | Ht 71.0 in | Wt 224.0 lb

## 2021-11-26 DIAGNOSIS — N1831 Chronic kidney disease, stage 3a: Secondary | ICD-10-CM

## 2021-11-26 DIAGNOSIS — I10 Essential (primary) hypertension: Secondary | ICD-10-CM

## 2021-11-26 DIAGNOSIS — E7800 Pure hypercholesterolemia, unspecified: Secondary | ICD-10-CM

## 2021-11-26 DIAGNOSIS — E1121 Type 2 diabetes mellitus with diabetic nephropathy: Secondary | ICD-10-CM

## 2021-11-26 DIAGNOSIS — I2510 Atherosclerotic heart disease of native coronary artery without angina pectoris: Secondary | ICD-10-CM

## 2021-11-26 DIAGNOSIS — Z23 Encounter for immunization: Secondary | ICD-10-CM | POA: Diagnosis not present

## 2021-11-26 DIAGNOSIS — I4821 Permanent atrial fibrillation: Secondary | ICD-10-CM

## 2021-11-26 NOTE — Progress Notes (Signed)
Provider:  Alain Honey, MD  Careteam: Patient Care Team: Wardell Honour, MD as PCP - General (Family Medicine) Nahser, Wonda Cheng, MD as PCP - Cardiology (Cardiology) Anda Kraft, MD (Endocrinology) Bernell List, CPhT as [place] Management (Pharmacy Technician)  PLACE OF SERVICE:  [facility] Directive information Does Patient Have a Medical Advance Directive?: Yes, Type of Advance Directive: [place];Living will, Does patient want to make changes to medical advance directive?: No - Patient declined  No Known Allergies  Chief Complaint  Patient presents with   Medical Management of Chronic Issues    8 month follow-up, flu vaccine and foot exam today. Discuss need for AWV, td/tdap, shingrix, eye exam, urine ACR, colonoscopy, PCV, covid booster, and A1c. Discuss Flomax, patient feels it is ineffective.      HPI: Patient is a 66 y.o. male here for medical management of chronic problems including diabetes, BPH with urinary frequency, and hypertension only complaint today is urinary frequency.  He is taking Flomax and I suggested he might increase that from 0.4 to 0.8 mg.  I suspect frequency is related to sugars and enlarged prostate.  We talked about decreasing fluid intake after supper before going to bed. He denies any numbness tingling or pain in his feet.  He denies chest pain.  He has not seen eye doctor this year and I encouraged that follow-up.  Review of Systems:  Review of Systems  Constitutional: Negative.   HENT: Negative.    Respiratory: Negative.    Cardiovascular: Negative.   Musculoskeletal: Negative.   Skin: Negative.   Neurological: Negative.   Psychiatric/Behavioral: Negative.      Past Medical History:  Diagnosis Date   A-fib ([place]) 11/19/2010   Acute exacerbation of congestive heart failure ([place]) 11/19/2010   Arthritis    CAD (coronary artery disease) 11/20/2010   CHF (congestive heart  failure) (HCC)    CKD (chronic kidney disease) stage 3, GFR 30-59 ml/min ([place]) 05/04/2017   Coronary artery disease    Diabetes type 2, controlled ([place]) 11/19/2010   GERD (gastroesophageal reflux disease) 02/08/2015   Gout    Gout 11/20/2010   History of cerebrovascular accident (CVA) with residual deficit 05/04/2017   HTN (hypertension) 07/01/2012   Hyperlipemia 11/21/2010   Hypertension    Hypertensive emergency 11/19/2010   ICH (intracerebral hemorrhage) ([place]) 12/22/2010   Physical deconditioning 12/22/2010   Pulmonary edema 11/19/2010   Respiratory failure ([place]) 11/19/2010   Shortness of breath    Stroke ([place]) 11/19/2010   Thyroiditis 11/20/2010   Past Surgical History:  Procedure Laterality Date   PEG PLACEMENT  12/03/2010   Procedure: PERCUTANEOUS ENDOSCOPIC GASTROSTOMY (PEG) PLACEMENT;  Surgeon: Lafayette Dragon, MD;  Location: [facility] ENDOSCOPY;  Service: Endoscopy;  Laterality: N/A;   TRACHEOSTOMY TUBE PLACEMENT  11/28/2010   Procedure: TRACHEOSTOMY;  Surgeon: Beckie Salts, MD;  Location: [facility];  Service: ENT;  Laterality: N/A;   Social History:   reports that he quit smoking about 15 years ago. His smoking use included cigarettes. He has never used smokeless tobacco. He reports that he does not drink alcohol and does not use drugs.  Family History  Problem Relation Age of Onset   Diabetes Mother    Diabetes Brother    Colon cancer Neg Hx    Colon polyps Neg Hx    Kidney disease Neg Hx    Esophageal cancer Neg Hx    Heart disease Neg Hx  Gallbladder disease Neg Hx     Medications: Patient's Medications  New Prescriptions   No medications on file  Previous Medications   ALCOHOL SWABS (ALCOHOL PREP) 70 % PADS    Use to test blood sugar daily. Dx: E11.21   APIXABAN (ELIQUIS) 5 MG TABS TABLET    Take 1 tablet (5 mg total) by mouth 2 (two) times daily.   BLOOD GLUCOSE METER KIT AND SUPPLIES    Dispense based on patient and insurance preference. Use up to four times  daily as directed. (FOR ICD-10 E10.9, E11.9).   CARVEDILOL (COREG) 25 MG TABLET    Take 1 tablet (25 mg total) by mouth 2 (two) times daily with a meal.   DILTIAZEM (CARDIZEM CD) 180 MG 24 HR CAPSULE    TAKE 2 CAPSULES BY MOUTH  DAILY AT NOON   EMPAGLIFLOZIN (JARDIANCE) 10 MG TABS TABLET    Take 1 tablet (10 mg total) by mouth daily.   FUROSEMIDE (LASIX) 40 MG TABLET    Take 40 mg by mouth as needed.   GLUCOSE BLOOD (ONETOUCH ULTRA) TEST STRIP    Use to test blood sugar daily. Dx: E11.21   ISOSORBIDE MONONITRATE (IMDUR) 30 MG 24 HR TABLET    Take 1 tablet (30 mg total) by mouth daily.   LOSARTAN (COZAAR) 25 MG TABLET    Take 1 tablet (25 mg total) by mouth daily.   METFORMIN (GLUCOPHAGE) 850 MG TABLET    Take 1 tablet (850 mg total) by mouth daily with breakfast.   MULTIPLE VITAMINS-MINERALS (MENS ONE DAILY PO)    Take 1 tablet by mouth daily.   ONETOUCH DELICA LANCETS 17C MISC    Use as directed to test blood sugar once daily   OXYBUTYNIN (DITROPAN-XL) 5 MG 24 HR TABLET    Take 1 tablet (5 mg total) by mouth at bedtime.   PANTOPRAZOLE (PROTONIX) 40 MG TABLET    Take one tablet by mouth daily at 12 noon. Needs an appointment before anymore Future Refills.   PRAVASTATIN (PRAVACHOL) 40 MG TABLET    Take 1 tablet (40 mg total) by mouth daily.   SITAGLIPTIN (JANUVIA) 50 MG TABLET    Take 1 tablet (50 mg total) by mouth daily.   SPIRONOLACTONE (ALDACTONE) 25 MG TABLET    Take 1 tablet (25 mg total) by mouth daily.   TAMSULOSIN (FLOMAX) 0.4 MG CAPS CAPSULE    Take 0.4 mg by mouth daily.  Modified Medications   No medications on file  Discontinued Medications   BLOOD GLUCOSE MONITORING SUPPL (ONETOUCH VERIO) W/DEVICE KIT    Use as directed to test blood sugar once daily    Physical Exam:  Vitals:   11/26/21 1247  BP: 118/72  Pulse: 87  Temp: 97.9 F (36.6 C)  TempSrc: Temporal  SpO2: 98%  Weight: 224 lb (101.6 kg)  Height: _0  (1.803 m)   Body mass index is 31.24 kg/m. Wt Readings  from Last 3 Encounters:  11/26/21 224 lb (101.6 kg)  03/25/21 221 lb (100.2 kg)  12/17/20 225 lb 3.2 oz (102.2 kg)    Physical Exam Vitals and nursing note reviewed.  Constitutional:      Appearance: He is obese.  Cardiovascular:     Rate and Rhythm: Normal rate and regular rhythm.     Pulses: Normal pulses.  Pulmonary:     Effort: Pulmonary effort is normal.     Breath sounds: Normal breath sounds.  Musculoskeletal:     Comments: Patient  in wheelchair  Neurological:     General: No focal deficit present.     Mental Status: He is alert and oriented to person, place, and time.     Labs reviewed: Basic Metabolic Panel: Recent Labs    12/17/20 1146 01/02/21 1329 01/22/21 1145  NA 132* 135 132*  K 4.8 5.1 4.7  CL 96 97 96  CO2 _0 GLUCOSE 180* 170* 228*  BUN 47* 33* 27  CREATININE 2.57* 1.82* 1.72*  CALCIUM 9.9 10.0 10.2   Liver Function Tests: Recent Labs    12/17/20 1146  ALT 12   No results for input(s): "LIPASE", "AMYLASE" in the last 8760 hours. No results for input(s): "AMMONIA" in the last 8760 hours. CBC: No results for input(s): "WBC", "NEUTROABS", "HGB", "HCT", "MCV", "PLT" in the last 8760 hours. Lipid Panel: No results for input(s): "CHOL", "HDL", "LDLCALC", "TRIG", "CHOLHDL", "LDLDIRECT" in the last 8760 hours. TSH: No results for input(s): "TSH" in the last 8760 hours. A1C: Lab Results  Component Value Date   HGBA1C 7.6 (H) 03/25/2021     Assessment/Plan  1. Need for influenza vaccination Flu shot given    3. Controlled type 2 diabetes mellitus with diabetic nephropathy, without long-term current use of insulin (HCC) Last A1c was elevated at 7.6.  He has checked his sugar some at home and generally sugars are between 130 and 170  4. Pure hypercholesterolemia Lipids were last assessed 1 year ago and LDL was at goal at 64  5. Coronary artery disease involving native coronary artery of native heart without angina pectoris Denies  chest pain.  Continue on Imdur and carvedilol as well as pravastatin  6. Stage 3a chronic kidney disease (HCC) Last GFR was 43 which puts him at CKD stage IIIb.  7. Primary hypertesion Blood pressure is good at 118/74 on current regimen of carvedilol and Imdur  8. Permanent atrial fibrillation Continues on Eliquis and carvedilol   Alain Honey, MD [place] [phone]

## 2021-11-27 LAB — COMPLETE METABOLIC PANEL WITH GFR
AG Ratio: 1.3 (calc) (ref 1.0–2.5)
ALT: 12 U/L (ref 9–46)
AST: 14 U/L (ref 10–35)
Albumin: 4.3 g/dL (ref 3.6–5.1)
Alkaline phosphatase (APISO): 65 U/L (ref 35–144)
BUN/Creatinine Ratio: 21 (calc) (ref 6–22)
BUN: 36 mg/dL — ABNORMAL HIGH (ref 7–25)
CO2: 24 mmol/L (ref 20–32)
Calcium: 9.8 mg/dL (ref 8.6–10.3)
Chloride: 98 mmol/L (ref 98–110)
Creat: 1.68 mg/dL — ABNORMAL HIGH (ref 0.70–1.35)
Globulin: 3.2 g/dL (calc) (ref 1.9–3.7)
Glucose, Bld: 136 mg/dL (ref 65–139)
Potassium: 4.8 mmol/L (ref 3.5–5.3)
Sodium: 134 mmol/L — ABNORMAL LOW (ref 135–146)
Total Bilirubin: 0.4 mg/dL (ref 0.2–1.2)
Total Protein: 7.5 g/dL (ref 6.1–8.1)
eGFR: 44 mL/min/{1.73_m2} — ABNORMAL LOW (ref >=60)

## 2021-11-27 LAB — LIPID PANEL
Cholesterol: 139 mg/dL (ref <200)
HDL: 53 mg/dL (ref >=40)
LDL Cholesterol (Calc): 64 mg/dL (calc)
Non-HDL Cholesterol (Calc): 86 mg/dL (calc) (ref <130)
Total CHOL/HDL Ratio: 2.6 (calc) (ref <5.0)
Triglycerides: 137 mg/dL (ref <150)

## 2021-11-27 LAB — HEMOGLOBIN A1C
Hgb A1c MFr Bld: 8.3 % of total Hgb — ABNORMAL HIGH (ref <5.7)
Mean Plasma Glucose: 192 mg/dL
eAG (mmol/L): 10.6 mmol/L

## 2021-12-08 ENCOUNTER — Other Ambulatory Visit: Payer: Self-pay

## 2021-12-08 DIAGNOSIS — K2100 Gastro-esophageal reflux disease with esophagitis, without bleeding: Secondary | ICD-10-CM

## 2021-12-08 MED ORDER — PANTOPRAZOLE SODIUM 40 MG PO TBEC: 40.0000 mg | DELAYED_RELEASE_TABLET | Freq: Every day | ORAL | 3 refills | Status: DC

## 2022-01-25 NOTE — Progress Notes (Signed)
Cardiology Office Note:    Date:  01/28/2022   ID:  [name], [name] Dec 30, 1955, MRN [id number]  PCP:  Wardell Honour, MD   [facility] Providers Cardiologist:  Mertie Moores, MD     Referring MD: Wardell Honour, MD   Chief Complaint: follow-up PAF  History of Present Illness:    [name] is a pleasant 67 y.o. male with a hx of PAF, HTN, intracranial hemorrhage on Coumadin, chronic HFpEF, COPD, diabetes, HLD, stroke with residual right-sided weakness  Previously a patient of Dr. Verl Blalock, has been followed by Dr. Acie Fredrickson since 2017.  Reported to have permanent atrial fibrillation and previous intracranial hemorrhage on Coumadin. He was started on apixaban in 2018. Due to history of stroke, uses either a walker or a wheelchair.  Last cardiology clinic visit was 12/17/2020 with Dr. Acie Fredrickson at which time he reported no significant cardiac symptoms. Triglycerides were elevated at 287 on recent lab work.  He did not want to add any more medications.  Was encouraged to reduce intake of simple carbohydrates, he admitted to eating a lot of potatoes.  Today, he is here for one year follow-up. Reports no cardiac symptoms. Continues to be followed by nephrology [facility] No recent problems to his awareness, "no problems."  He denies chest pain, shortness of breath, orthopnea, PND, edema, palpitations, presyncope, syncope.  No bleeding concerns. Wants to know if he can stop Jardiance. Encouraged him to talk with prescribing provider. No additional concerns with medications.   Past Medical History:  Diagnosis Date   A-fib ([place]) 11/19/2010   Acute exacerbation of congestive heart failure ([place]) 11/19/2010   Arthritis    CAD (coronary artery disease) 11/20/2010   CHF (congestive heart failure) (HCC)    CKD (chronic kidney disease) stage 3, GFR 30-59 ml/min ([place]) 05/04/2017   Coronary artery disease    Diabetes type 2, controlled ([place]) 11/19/2010   GERD (gastroesophageal reflux  disease) 02/08/2015   Gout    Gout 11/20/2010   History of cerebrovascular accident (CVA) with residual deficit 05/04/2017   HTN (hypertension) 07/01/2012   Hyperlipemia 11/21/2010   Hypertension    Hypertensive emergency 11/19/2010   ICH (intracerebral hemorrhage) ([place]) 12/22/2010   Physical deconditioning 12/22/2010   Pulmonary edema 11/19/2010   Respiratory failure ([place]) 11/19/2010   Shortness of breath    Stroke ([place]) 11/19/2010   Thyroiditis 11/20/2010    Past Surgical History:  Procedure Laterality Date   PEG PLACEMENT  12/03/2010   Procedure: PERCUTANEOUS ENDOSCOPIC GASTROSTOMY (PEG) PLACEMENT;  Surgeon: Lafayette Dragon, MD;  Location: [facility] ENDOSCOPY;  Service: Endoscopy;  Laterality: N/A;   TRACHEOSTOMY TUBE PLACEMENT  11/28/2010   Procedure: TRACHEOSTOMY;  Surgeon: Beckie Salts, MD;  Location: [facility] OR;  Service: ENT;  Laterality: N/A;    Current Medications: Current Meds  Medication Sig   Alcohol Swabs (ALCOHOL PREP) 70 % PADS Use to test blood sugar daily. Dx: E11.21   apixaban (ELIQUIS) 5 MG TABS tablet Take 1 tablet (5 mg total) by mouth 2 (two) times daily.   blood glucose meter kit and supplies Dispense based on patient and insurance preference. Use up to four times daily as directed. (FOR ICD-10 E10.9, E11.9).   carvedilol (COREG) 25 MG tablet Take 1 tablet (25 mg total) by mouth 2 (two) times daily with a meal.   diltiazem (CARDIZEM CD) 180 MG 24 hr capsule TAKE 2 CAPSULES BY MOUTH  DAILY AT NOON   empagliflozin (JARDIANCE) 10 MG  TABS tablet Take 1 tablet (10 mg total) by mouth daily.   furosemide (LASIX) 40 MG tablet Take 40 mg by mouth as needed.   glucose blood (ONETOUCH ULTRA) test strip Use to test blood sugar daily. Dx: E11.21   isosorbide mononitrate (IMDUR) 30 MG 24 hr tablet Take 1 tablet (30 mg total) by mouth daily.   losartan (COZAAR) 25 MG tablet Take 1 tablet (25 mg total) by mouth daily.   metFORMIN (GLUCOPHAGE) 850 MG tablet Take 1 tablet (850 mg  total) by mouth daily with breakfast.   Multiple Vitamins-Minerals (MENS ONE DAILY PO) Take 1 tablet by mouth daily.   ONETOUCH DELICA LANCETS 62M MISC Use as directed to test blood sugar once daily   pantoprazole (PROTONIX) 40 MG tablet Take 1 tablet (40 mg total) by mouth daily.   pravastatin (PRAVACHOL) 40 MG tablet Take 1 tablet (40 mg total) by mouth daily.   sitaGLIPtin (JANUVIA) 50 MG tablet Take 1 tablet (50 mg total) by mouth daily.   spironolactone (ALDACTONE) 25 MG tablet Take 1 tablet (25 mg total) by mouth daily.   tamsulosin (FLOMAX) 0.4 MG CAPS capsule Take 0.4 mg by mouth daily.     Allergies:   Patient has no known allergies.   Social History   Socioeconomic History   Marital status: Single    Spouse name: Not on file   Number of children: 0   Years of education: Not on file   Highest education level: Not on file  Occupational History   Occupation: Disability  Tobacco Use   Smoking status: Former    Types: Cigarettes    Quit date: 12/19/2005    Years since quitting: 16.1   Smokeless tobacco: Never  Vaping Use   Vaping Use: Never used  Substance and Sexual Activity   Alcohol use: No    Alcohol/week: 11.0 standard drinks of alcohol    Types: 6 Glasses of wine, 5 Shots of liquor per week    Comment: quit in 2012   Drug use: No   Sexual activity: Not on file  Other Topics Concern   Not on file  Social History Narrative   Diet: No      Caffeine: Yes      Married, if yes what year: No      Do you live in a house, apartment, assisted living, condo, trailer, ect: House      Is it one or more stories: No      How many persons live in your home? One      Pets: No      Highest level or education completed: 4 year college      Current/Past profession: Business       Exercise: Yes                 Type and how often: Every other day         Living Will: Yes   DNR: No   POA/HPOA: Yes      Functional Status:   Do you have difficulty bathing or  dressing yourself? No   Do you have difficulty preparing food or eating? No   Do you have difficulty managing your medications? No   Do you have difficulty managing your finances? No   Do you have difficulty affording your medications? No   Social Determinants of Health   Financial Resource Strain: Not on file  Food Insecurity: Not on file  Transportation Needs: Unmet Transportation Needs (01/27/2022)  PRAPARE - Hydrologist (Medical): Yes    Lack of Transportation (Non-Medical): Yes  Physical Activity: Not on file  Stress: Not on file  Social Connections: Not on file     Family History: The patient's family history includes Diabetes in his brother and mother. There is no history of Colon cancer, Colon polyps, Kidney disease, Esophageal cancer, Heart disease, or Gallbladder disease.  ROS:   Please see the history of present illness.   All other systems reviewed and are negative.  Labs/Other Studies Reviewed:    The following studies were reviewed today:  Echo 08/25/12 Left ventricle: The cavity size was normal. There was    moderate concentric hypertrophy. Systolic function was    normal. The estimated ejection fraction was in the range    of 55% to 60%. Wall motion was normal; there were no    regional wall motion abnormalities.  - Mitral valve: Mild regurgitation.  - Atrial septum: No defect or patent foramen ovale was    identified.  Carotid Ultrasound 08/23/12 Mild technical difficulty due to high bifurcations and    tortuosity.  - Bilateral - 1% to 39% ICA stenosis. Vertebral artery flow    is antegrade.  Other specific details can be found in the table(s) above.  Recent Labs: 11/26/2021: ALT 12; BUN 36; Creat 1.68; Potassium 4.8; Sodium 134  Recent Lipid Panel    Component Value Date/Time   CHOL 139 11/26/2021 1335   CHOL 181 12/12/2019 1544   TRIG 137 11/26/2021 1335   HDL 53 11/26/2021 1335   HDL 53 12/12/2019 1544   CHOLHDL  2.6 11/26/2021 1335   VLDL 25 02/07/2016 1152   LDLCALC 64 11/26/2021 1335     Risk Assessment/Calculations:    CHA2DS2-VASc Score = 7  This indicates a 11.2% annual risk of stroke. The patient's score is based upon: CHF History: 1 HTN History: 1 Diabetes History: 1 Stroke History: 2 Vascular Disease History: 1 Age Score: 1 Gender Score: 0    Physical Exam:    VS:  BP 116/64   Pulse 68   Ht '5\' 11"'$  (1.803 m)   Wt 185 lb (83.9 kg)   SpO2 94%   BMI 25.80 kg/m     Wt Readings from Last 3 Encounters:  01/28/22 185 lb (83.9 kg)  11/26/21 224 lb (101.6 kg)  03/25/21 221 lb (100.2 kg)     GEN: chronically ill appearing well nourished, well developed in no acute distress HEENT: Normal NECK: No JVD; No carotid bruits CARDIAC: Irregular RR, no murmurs, rubs, gallops RESPIRATORY:  Clear to auscultation without rales, wheezing or rhonchi  ABDOMEN: Soft, non-tender, non-distended MUSCULOSKELETAL:  No edema; No deformity. 2+ pedal pulses, equal bilaterally SKIN: Warm and dry NEUROLOGIC:  Alert and oriented x 3 PSYCHIATRIC:  Normal affect   EKG:  EKG is  ordered today.  The ekg ordered today demonstrates atrial fibrillation at 68 bpm, LAD, prior inferior infarct, no acute change from previous tracing  Diagnoses:    1. Hypertension, unspecified type   2. Medication management   3. Hyperlipidemia LDL goal <70   4. Secondary hypercoagulable state ([place])   5. Chronic diastolic CHF (congestive heart failure) ([place])   6. Permanent atrial fibrillation   7. Stage 3b chronic kidney disease (HCC)    Assessment and Plan:     Chronic HFpEF: No evidence of volume overload on exam.  He denies dyspnea, edema, PND, orthopnea. Weight is stable. He is not  able to be very active. Continue spironolactone, losartan, empagliflozin, carvedilol.   Permanent atrial fibrillation on chronic anticoagulation: Atrial fibrillation at 68 bpm on EKG, well controlled ventricular rate. He is asymptomatic.  Continue rate control with carvedilol 25 mg twice daily. Continue stroke prevention for CHA2DS2-VASc score = 7 with Eliquis 5 mg twice daily.  Hypertension: BP is well controlled.   Hyperlipidemia LDL goal < 70: LDL 64 on 11/26/2021. Continue pravastatin.  CKD Stage 3b: Scr 1.68, GFR 44 on lab work 11/26/21. Continue low dose losartan. He asks if he should continue Jardiance. Encouraged him to continue since it also has cardiac benefit but to discuss with prescribing provider.  Management per nephrology.     Disposition: 1 year with Dr. Acie Fredrickson  Medication Adjustments/Labs and Tests Ordered: Current medicines are reviewed at length with the patient today.  Concerns regarding medicines are outlined above.  Orders Placed This Encounter  Procedures   EKG 12-Lead   No orders of the defined types were placed in this encounter.   Patient Instructions  Medication Instructions:   Your physician recommends that you continue on your current medications as directed. Please refer to the Current Medication list given to you today.   *If you need a refill on your cardiac medications before your next appointment, please call your pharmacy*   Lab Work:  None ordered.  If you have labs (blood work) drawn today and your tests are completely normal, you will receive your results only by: [place] (if you have MyChart) OR A paper copy in the mail If you have any lab test that is abnormal or we need to change your treatment, we will call you to review the results.   Testing/Procedures:  None ordered.   Follow-Up: At [facility], you and your health needs are our priority.  As part of our continuing mission to provide you with exceptional heart care, we have created designated Provider Care Teams.  These Care Teams include your primary Cardiologist (physician) and Advanced Practice Providers (APPs -  Physician Assistants and Nurse Practitioners) who all work together to provide  you with the care you need, when you need it.  We recommend signing up for the patient portal called "MyChart".  Sign up information is provided on this After Visit Summary.  MyChart is used to connect with patients for Virtual Visits (Telemedicine).  Patients are able to view lab/test results, encounter notes, upcoming appointments, etc.  Non-urgent messages can be sent to your provider as well.   To learn more about what you can do with MyChart, go to [email or web address].    Your next appointment:   1 year(s)  Provider:   Mertie Moores, MD     Other Instructions  Your physician wants you to follow-up in: 1 year with Dr. Acie Fredrickson.  You will receive a reminder letter in the mail two months in advance. If you don't receive a letter, please call our office to schedule the follow-up appointment.     Signed, Emmaline Life, NP  01/28/2022 5:09 PM    [place]

## 2022-01-26 ENCOUNTER — Ambulatory Visit: Payer: Medicare Other | Admitting: Nurse Practitioner

## 2022-01-28 ENCOUNTER — Encounter: Payer: Self-pay | Admitting: Nurse Practitioner

## 2022-01-28 ENCOUNTER — Ambulatory Visit: Payer: Medicare Other | Attending: Nurse Practitioner | Admitting: Nurse Practitioner

## 2022-01-28 VITALS — BP 116/64 | HR 68 | Ht 71.0 in | Wt 185.0 lb

## 2022-01-28 DIAGNOSIS — D6869 Other thrombophilia: Secondary | ICD-10-CM | POA: Diagnosis not present

## 2022-01-28 DIAGNOSIS — N1832 Chronic kidney disease, stage 3b: Secondary | ICD-10-CM

## 2022-01-28 DIAGNOSIS — I10 Essential (primary) hypertension: Secondary | ICD-10-CM

## 2022-01-28 DIAGNOSIS — E785 Hyperlipidemia, unspecified: Secondary | ICD-10-CM

## 2022-01-28 DIAGNOSIS — I5032 Chronic diastolic (congestive) heart failure: Secondary | ICD-10-CM

## 2022-01-28 DIAGNOSIS — I4821 Permanent atrial fibrillation: Secondary | ICD-10-CM

## 2022-01-28 DIAGNOSIS — Z79899 Other long term (current) drug therapy: Secondary | ICD-10-CM

## 2022-01-28 NOTE — Patient Instructions (Signed)
Medication Instructions:   Your physician recommends that you continue on your current medications as directed. Please refer to the Current Medication list given to you today.   *If you need a refill on your cardiac medications before your next appointment, please call your pharmacy*   Lab Work:  None ordered.  If you have labs (blood work) drawn today and your tests are completely normal, you will receive your results only by: MyChart Message (if you have MyChart) OR A paper copy in the mail If you have any lab test that is abnormal or we need to change your treatment, we will call you to review the results.   Testing/Procedures:  None ordered.   Follow-Up: At [facility], you and your health needs are our priority.  As part of our continuing mission to provide you with exceptional heart care, we have created designated Provider Care Teams.  These Care Teams include your primary Cardiologist (physician) and Advanced Practice Providers (APPs -  Physician Assistants and Nurse Practitioners) who all work together to provide you with the care you need, when you need it.  We recommend signing up for the patient portal called "MyChart".  Sign up information is provided on this After Visit Summary.  MyChart is used to connect with patients for Virtual Visits (Telemedicine).  Patients are able to view lab/test results, encounter notes, upcoming appointments, etc.  Non-urgent messages can be sent to your provider as well.   To learn more about what you can do with MyChart, go to [email or web address].    Your next appointment:   1 year(s)  Provider:   Philip Nahser, MD     Other Instructions  Your physician wants you to follow-up in: 1 year with Dr. Nahser.  You will receive a reminder letter in the mail two months in advance. If you don't receive a letter, please call our office to schedule the follow-up appointment.   

## 2022-02-02 ENCOUNTER — Other Ambulatory Visit: Payer: Self-pay

## 2022-02-02 MED ORDER — METFORMIN HCL 850 MG PO TABS: 850.0000 mg | ORAL_TABLET | Freq: Every day | ORAL | 2 refills | Status: DC

## 2022-02-02 MED ORDER — EMPAGLIFLOZIN 10 MG PO TABS: 10.0000 mg | ORAL_TABLET | Freq: Every day | ORAL | 2 refills | Status: DC

## 2022-02-02 MED ORDER — PRAVASTATIN SODIUM 40 MG PO TABS: 40.0000 mg | ORAL_TABLET | Freq: Every day | ORAL | 2 refills | Status: DC

## 2022-02-02 NOTE — Addendum Note (Signed)
Addended by: Logan Bores on: 02/02/2022 01:09 PM   Modules accepted: Orders

## 2022-02-02 NOTE — Addendum Note (Signed)
Addended by: Logan Bores on: 02/02/2022 01:03 PM   Modules accepted: Orders

## 2022-02-25 ENCOUNTER — Ambulatory Visit: Payer: Medicare Other | Admitting: Family

## 2022-03-02 ENCOUNTER — Ambulatory Visit: Payer: Medicare Other | Admitting: Podiatry

## 2022-03-03 ENCOUNTER — Other Ambulatory Visit: Payer: Self-pay | Admitting: *Deleted

## 2022-03-03 ENCOUNTER — Other Ambulatory Visit: Payer: Self-pay | Admitting: Family

## 2022-03-03 DIAGNOSIS — I10 Essential (primary) hypertension: Secondary | ICD-10-CM

## 2022-03-03 DIAGNOSIS — I2510 Atherosclerotic heart disease of native coronary artery without angina pectoris: Secondary | ICD-10-CM

## 2022-03-03 MED ORDER — CARVEDILOL 25 MG PO TABS: 25.0000 mg | ORAL_TABLET | Freq: Two times a day (BID) | ORAL | 0 refills | Status: DC

## 2022-03-03 NOTE — Telephone Encounter (Signed)
Pharmacy requested refill. 

## 2022-03-03 NOTE — Telephone Encounter (Signed)
High risk or very high risk warning populated when attempting to refill medication. RX request sent to PCP for review and approval if warranted.   

## 2022-03-06 ENCOUNTER — Other Ambulatory Visit: Payer: Self-pay | Admitting: *Deleted

## 2022-03-06 DIAGNOSIS — I10 Essential (primary) hypertension: Secondary | ICD-10-CM

## 2022-03-06 DIAGNOSIS — I2510 Atherosclerotic heart disease of native coronary artery without angina pectoris: Secondary | ICD-10-CM

## 2022-03-06 MED ORDER — CARVEDILOL 25 MG PO TABS: 25.0000 mg | ORAL_TABLET | Freq: Two times a day (BID) | ORAL | 0 refills | Status: DC

## 2022-03-06 NOTE — Telephone Encounter (Signed)
Patient requested refill.  Scheduled an appointment for follow up with patient for 3/20.

## 2022-03-11 ENCOUNTER — Encounter: Payer: Self-pay | Admitting: Podiatry

## 2022-03-11 ENCOUNTER — Ambulatory Visit (INDEPENDENT_AMBULATORY_CARE_PROVIDER_SITE_OTHER): Payer: Medicare Other | Admitting: Podiatry

## 2022-03-11 DIAGNOSIS — B351 Tinea unguium: Secondary | ICD-10-CM | POA: Diagnosis not present

## 2022-03-11 DIAGNOSIS — E0822 Diabetes mellitus due to underlying condition with diabetic chronic kidney disease: Secondary | ICD-10-CM | POA: Diagnosis not present

## 2022-03-11 DIAGNOSIS — M79675 Pain in left toe(s): Secondary | ICD-10-CM

## 2022-03-11 DIAGNOSIS — N183 Chronic kidney disease, stage 3 unspecified: Secondary | ICD-10-CM | POA: Diagnosis not present

## 2022-03-11 DIAGNOSIS — Z794 Long term (current) use of insulin: Secondary | ICD-10-CM

## 2022-03-11 DIAGNOSIS — M79674 Pain in right toe(s): Secondary | ICD-10-CM

## 2022-03-11 NOTE — Progress Notes (Signed)
This patient returns to my office for at risk foot care.  This patient requires this care by a professional since this patient will be at risk due to having CKD, and diabetes and CVA.  This patient is unable to cut nails himself since the patient cannot reach his nails.These nails are painful walking and wearing shoes.  This patient presents for at risk foot care today.  General Appearance  Alert, conversant and in no acute stress.  Vascular  Dorsalis pedis and posterior tibial  pulses are palpable  right foot.  Weak  DP and PT left due to CVA.  Capillary return is within normal limits  bilaterally. Temperature is within normal limits  bilaterally.  Venous stasis.  Neurologic  Senn-Weinstein monofilament wire test within normal limits  bilaterally. Muscle power within normal limits bilaterally.  Nails Thick disfigured discolored nails with subungual debris  from hallux to fifth toes bilaterally. No evidence of bacterial infection or drainage bilaterally.  Orthopedic  No limitations of motion  feet .  No crepitus or effusions noted.  No bony pathology or digital deformities noted.  Skin  normotropic skin with no porokeratosis noted bilaterally.  No signs of infections or ulcers noted.     Onychomycosis  Pain in right toes  Pain in left toes  Consent was obtained for treatment procedures.   Mechanical debridement of nails 1-5  bilaterally performed with a nail nipper.  Filed with dremel without incident.    Return office visit  3  months                   Told patient to return for periodic foot care and evaluation due to potential at risk complications.   Gardiner Barefoot DPM

## 2022-03-30 ENCOUNTER — Other Ambulatory Visit: Payer: Self-pay | Admitting: Pharmacist

## 2022-03-30 DIAGNOSIS — I482 Chronic atrial fibrillation, unspecified: Secondary | ICD-10-CM

## 2022-03-30 MED ORDER — APIXABAN 5 MG PO TABS: 5.0000 mg | ORAL_TABLET | Freq: Two times a day (BID) | ORAL | 1 refills | Status: DC

## 2022-03-30 NOTE — Telephone Encounter (Signed)
Prescription refill request for Eliquis received. Indication: a fib Last office visit: 01/28/22 Scr: 1.68 11/26/21 epic Age: 67 Weight: 83kg

## 2022-04-01 ENCOUNTER — Ambulatory Visit (INDEPENDENT_AMBULATORY_CARE_PROVIDER_SITE_OTHER): Payer: Medicare Other | Admitting: Family Medicine

## 2022-04-01 ENCOUNTER — Encounter: Payer: Self-pay | Admitting: Family Medicine

## 2022-04-01 VITALS — BP 121/78 | HR 79 | Temp 97.4°F | Resp 18 | Ht 71.0 in | Wt 223.0 lb

## 2022-04-01 DIAGNOSIS — I5032 Chronic diastolic (congestive) heart failure: Secondary | ICD-10-CM | POA: Diagnosis not present

## 2022-04-01 DIAGNOSIS — E7800 Pure hypercholesterolemia, unspecified: Secondary | ICD-10-CM

## 2022-04-01 DIAGNOSIS — I10 Essential (primary) hypertension: Secondary | ICD-10-CM

## 2022-04-01 DIAGNOSIS — N1831 Chronic kidney disease, stage 3a: Secondary | ICD-10-CM | POA: Diagnosis not present

## 2022-04-01 DIAGNOSIS — I4821 Permanent atrial fibrillation: Secondary | ICD-10-CM

## 2022-04-01 DIAGNOSIS — E0821 Diabetes mellitus due to underlying condition with diabetic nephropathy: Secondary | ICD-10-CM | POA: Diagnosis not present

## 2022-04-01 NOTE — Progress Notes (Signed)
Provider:  Alain Honey, MD  Careteam: Patient Care Team: Wardell Honour, MD as PCP - General (Family Medicine) Nahser, Wonda Cheng, MD as PCP - Cardiology (Cardiology) Anda Kraft, MD (Endocrinology) Bernell List, CPhT as [facility] (Pharmacy Technician)  PLACE OF SERVICE:  [facility] Directive information    No Known Allergies  Chief Complaint  Patient presents with   Follow-up    Follow-up     HPI: Patient is a 67 y.o. [name] follow-up for patient who is status post CVA and has diabetes, hyperlipidemia hypertension.  He is in a wheelchair here but gets around his house mostly with walker. Last A1c was elevated.  He says that he is compliant with his medicines but I think diet is where downfall occurs.  This was stressed last time.  Will check A1c today to see how he is doing.  He is asking for samples of Jardiance today but has ordered some through mail order.  Has not had vision exam in the last year and he was asked to try and see an eye doctor  Review of Systems:  Review of Systems  Constitutional: Negative.   HENT: Negative.    Respiratory: Negative.    Cardiovascular: Negative.   Genitourinary: Negative.   Musculoskeletal: Negative.   Neurological: Negative.   Psychiatric/Behavioral: Negative.    All other systems reviewed and are negative.   Past Medical History:  Diagnosis Date   A-fib ([place]) 11/19/2010   Acute exacerbation of congestive heart failure ([place]) 11/19/2010   Arthritis    CAD (coronary artery disease) 11/20/2010   CHF (congestive heart failure) (HCC)    CKD (chronic kidney disease) stage 3, GFR 30-59 ml/min ([place]) 05/04/2017   Coronary artery disease    Diabetes type 2, controlled ([place]) 11/19/2010   GERD (gastroesophageal reflux disease) 02/08/2015   Gout    Gout 11/20/2010   History of cerebrovascular accident (CVA) with residual deficit 05/04/2017   HTN (hypertension) 07/01/2012    Hyperlipemia 11/21/2010   Hypertension    Hypertensive emergency 11/19/2010   ICH (intracerebral hemorrhage) ([place]) 12/22/2010   Physical deconditioning 12/22/2010   Pulmonary edema 11/19/2010   Respiratory failure ([place]) 11/19/2010   Shortness of breath    Stroke ([place]) 11/19/2010   Thyroiditis 11/20/2010   Past Surgical History:  Procedure Laterality Date   PEG PLACEMENT  12/03/2010   Procedure: PERCUTANEOUS ENDOSCOPIC GASTROSTOMY (PEG) PLACEMENT;  Surgeon: Lafayette Dragon, MD;  Location: [facility] ENDOSCOPY;  Service: Endoscopy;  Laterality: N/A;   TRACHEOSTOMY TUBE PLACEMENT  11/28/2010   Procedure: TRACHEOSTOMY;  Surgeon: Beckie Salts, MD;  Location: [facility];  Service: ENT;  Laterality: N/A;   Social History:   reports that he quit smoking about 16 years ago. His smoking use included cigarettes. He has never used smokeless tobacco. He reports that he does not drink alcohol and does not use drugs.  Family History  Problem Relation Age of Onset   Diabetes Mother    Diabetes Brother    Colon cancer Neg Hx    Colon polyps Neg Hx    Kidney disease Neg Hx    Esophageal cancer Neg Hx    Heart disease Neg Hx    Gallbladder disease Neg Hx     Medications: Patient's Medications  New Prescriptions   No medications on file  Previous Medications   ALCOHOL SWABS (ALCOHOL PREP) 70 % PADS    Use to test blood sugar daily. Dx: E11.21  APIXABAN (ELIQUIS) 5 MG TABS TABLET    Take 1 tablet (5 mg total) by mouth 2 (two) times daily.   BLOOD GLUCOSE METER KIT AND SUPPLIES    Dispense based on patient and insurance preference. Use up to four times daily as directed. (FOR ICD-10 E10.9, E11.9).   CARVEDILOL (COREG) 25 MG TABLET    Take 1 tablet (25 mg total) by mouth 2 (two) times daily with a meal. Needs an appointment before anymore future refills.   DILTIAZEM (CARDIZEM CD) 180 MG 24 HR CAPSULE    TAKE 2 CAPSULES BY MOUTH  DAILY AT NOON   EMPAGLIFLOZIN (JARDIANCE) 10 MG TABS TABLET    Take 1 tablet (10  mg total) by mouth daily.   FUROSEMIDE (LASIX) 40 MG TABLET    Take 40 mg by mouth as needed.   GLUCOSE BLOOD (ONETOUCH ULTRA) TEST STRIP    Use to test blood sugar daily. Dx: E11.21   ISOSORBIDE MONONITRATE (IMDUR) 30 MG 24 HR TABLET    Take 1 tablet (30 mg total) by mouth daily.   LOSARTAN (COZAAR) 25 MG TABLET    Take 1 tablet (25 mg total) by mouth daily.   METFORMIN (GLUCOPHAGE) 850 MG TABLET    Take 1 tablet (850 mg total) by mouth daily with breakfast.   MULTIPLE VITAMINS-MINERALS (MENS ONE DAILY PO)    Take 1 tablet by mouth daily.   ONETOUCH DELICA LANCETS 99991111 MISC    Use as directed to test blood sugar once daily   OXYBUTYNIN (DITROPAN-XL) 5 MG 24 HR TABLET    Take 1 tablet (5 mg total) by mouth at bedtime.   PANTOPRAZOLE (PROTONIX) 40 MG TABLET    Take 1 tablet (40 mg total) by mouth daily.   PRAVASTATIN (PRAVACHOL) 40 MG TABLET    Take 1 tablet (40 mg total) by mouth daily.   SITAGLIPTIN (JANUVIA) 50 MG TABLET    Take 1 tablet (50 mg total) by mouth daily.   SPIRONOLACTONE (ALDACTONE) 25 MG TABLET    TAKE 1 TABLET BY MOUTH DAILY   TAMSULOSIN (FLOMAX) 0.4 MG CAPS CAPSULE    Take 0.4 mg by mouth daily.  Modified Medications   No medications on file  Discontinued Medications   No medications on file    Physical Exam:  Vitals:   04/01/22 1358  BP: 121/78  Pulse: 79  Resp: 18  Temp: (!) 97.4 F (36.3 C)  SpO2: [place]%  Weight: 223 lb (101.2 kg)  Height: 5\' 11"  (1.803 m)   Body mass index is 31.1 kg/m. Wt Readings from Last 3 Encounters:  04/01/22 223 lb (101.2 kg)  01/28/22 185 lb (83.9 kg)  11/26/21 224 lb (101.6 kg)    Physical Exam Vitals and nursing note reviewed.  Constitutional:      Appearance: Normal appearance.  Cardiovascular:     Rate and Rhythm: Normal rate and regular rhythm.  Pulmonary:     Effort: Pulmonary effort is normal.     Breath sounds: Normal breath sounds.  Musculoskeletal:        General: Normal range of motion.  Skin:    General: Skin  is warm and dry.  Neurological:     General: No focal deficit present.     Mental Status: He is alert and oriented to person, place, and time.  Psychiatric:        Mood and Affect: Mood normal.        Behavior: Behavior normal.     Labs  reviewed: Basic Metabolic Panel: Recent Labs    11/26/21 1335  NA 134*  K 4.8  CL 98  CO2 24  GLUCOSE 136  BUN 36*  CREATININE 1.68*  CALCIUM 9.8   Liver Function Tests: Recent Labs    11/26/21 1335  AST 14  ALT 12  BILITOT 0.4  PROT 7.5   No results for input(s): "LIPASE", "AMYLASE" in the last 8760 hours. No results for input(s): "AMMONIA" in the last 8760 hours. CBC: No results for input(s): "WBC", "NEUTROABS", "HGB", "HCT", "MCV", "PLT" in the last 8760 hours. Lipid Panel: Recent Labs    11/26/21 1335  CHOL 139  HDL 53  LDLCALC 64  TRIG 137  CHOLHDL 2.6   TSH: No results for input(s): "TSH" in the last 8760 hours. A1C: Lab Results  Component Value Date   HGBA1C 8.3 (H) 11/26/2021     Assessment/Plan  1. Essential hypertension Blood pressure acceptable at 121/78  2. Stage 3a chronic kidney disease (HCC) Will check BMP today to reassess current level of kidney function  3. Diabetes due to underlying condition w diabetic nephropathy ([place]) 3 months ago creatinine was 1.68 with BUN of 36 and GFR 44.  Stressed importance of good control of modifiable risk factors including diabetes cholesterol and blood pressure.  Also stressed the need to stay well-hydrated  4. Chronic diastolic CHF (congestive heart failure) (HCC) Denies shortness of breath or dependent edema  5. Primary hypertension Blood pressure controlled with carvedilol 25 mg twice daily and spironolactone  6. Pure hypercholesterolemia Last LDL was 3 months ago at 64 current medication includes pravastatin 40 mg  7. Permanent atrial fibrillation Continues with Eliquis 5 mg twice daily and carvedilol for rate control  Alain Honey, MD [place] [phone]

## 2022-04-01 NOTE — Patient Instructions (Signed)
Continue to limit your carbohydrates

## 2022-04-02 LAB — BASIC METABOLIC PANEL WITH GFR
BUN/Creatinine Ratio: 18 (calc) (ref 6–22)
BUN: 29 mg/dL — ABNORMAL HIGH (ref 7–25)
CO2: 26 mmol/L (ref 20–32)
Calcium: 10.6 mg/dL — ABNORMAL HIGH (ref 8.6–10.3)
Chloride: 99 mmol/L (ref 98–110)
Creat: 1.57 mg/dL — ABNORMAL HIGH (ref 0.70–1.35)
Glucose, Bld: 133 mg/dL — ABNORMAL HIGH (ref 65–99)
Potassium: 5.1 mmol/L (ref 3.5–5.3)
Sodium: 135 mmol/L (ref 135–146)
eGFR: 48 mL/min/{1.73_m2} — ABNORMAL LOW (ref >=60)

## 2022-04-02 LAB — HEMOGLOBIN A1C
Hgb A1c MFr Bld: 8.3 % of total Hgb — ABNORMAL HIGH (ref <5.7)
Mean Plasma Glucose: 192 mg/dL
eAG (mmol/L): 10.6 mmol/L

## 2022-04-09 ENCOUNTER — Ambulatory Visit (INDEPENDENT_AMBULATORY_CARE_PROVIDER_SITE_OTHER): Payer: Medicare Other | Admitting: Adult Health

## 2022-04-09 ENCOUNTER — Encounter: Payer: Self-pay | Admitting: Adult Health

## 2022-04-09 VITALS — BP 130/80 | HR 84 | Temp 98.4°F | Resp 18 | Ht 71.0 in | Wt 222.1 lb

## 2022-04-09 DIAGNOSIS — N390 Urinary tract infection, site not specified: Secondary | ICD-10-CM

## 2022-04-09 DIAGNOSIS — L309 Dermatitis, unspecified: Secondary | ICD-10-CM

## 2022-04-09 DIAGNOSIS — R300 Dysuria: Secondary | ICD-10-CM | POA: Diagnosis not present

## 2022-04-09 DIAGNOSIS — R338 Other retention of urine: Secondary | ICD-10-CM

## 2022-04-09 DIAGNOSIS — N401 Enlarged prostate with lower urinary tract symptoms: Secondary | ICD-10-CM | POA: Diagnosis not present

## 2022-04-09 LAB — POCT URINALYSIS DIP (MANUAL ENTRY)
Bilirubin, UA: NEGATIVE
Glucose, UA: >=1000 mg/dL — AB
Ketones, POC UA: NEGATIVE mg/dL
Nitrite, UA: NEGATIVE
Protein Ur, POC: 30 mg/dL — AB
Spec Grav, UA: 1.01 (ref 1.010–1.025)
Urobilinogen, UA: 0.2 E.U./dL
pH, UA: 6 (ref 5.0–8.0)

## 2022-04-09 MED ORDER — SULFAMETHOXAZOLE-TRIMETHOPRIM 800-160 MG PO TABS: 1.0000 | ORAL_TABLET | Freq: Two times a day (BID) | ORAL | 0 refills | Status: DC

## 2022-04-09 MED ORDER — TRIAMCINOLONE ACETONIDE 0.5 % EX OINT: 1.0000 | TOPICAL_OINTMENT | Freq: Two times a day (BID) | CUTANEOUS | 0 refills | Status: AC

## 2022-04-09 NOTE — Progress Notes (Signed)
[facility] clinic  Provider: Durenda Age DNP  Code Status:  Full Code  Goals of Care:     11/26/2021   12:47 PM  Advanced Directives  Does Patient Have a Medical Advance Directive? Yes  Type of Paramedic of [place];Living will  Does patient want to make changes to medical advance directive? No - Patient declined  Copy of [place] in Chart? No - copy requested     Chief Complaint  Patient presents with   Acute Visit    Having problem urinating    HPI: Patient is a 67 y.o. male seen today for an acute visit for painful urination. He said that he started having pain this morning. No blood in urine and denies fever. He had chills. He has dry pruritic rashes on his right arm. He has PMH of hyperlipidemia, hypertension, diastolic CHF and CKD 3.   Past Medical History:  Diagnosis Date   A-fib ([place]) 11/19/2010   Acute exacerbation of congestive heart failure ([place]) 11/19/2010   Arthritis    CAD (coronary artery disease) 11/20/2010   CHF (congestive heart failure) (HCC)    CKD (chronic kidney disease) stage 3, GFR 30-59 ml/min ([place]) 05/04/2017   Coronary artery disease    Diabetes type 2, controlled ([place]) 11/19/2010   GERD (gastroesophageal reflux disease) 02/08/2015   Gout    Gout 11/20/2010   History of cerebrovascular accident (CVA) with residual deficit 05/04/2017   HTN (hypertension) 07/01/2012   Hyperlipemia 11/21/2010   Hypertension    Hypertensive emergency 11/19/2010   ICH (intracerebral hemorrhage) ([place]) 12/22/2010   Physical deconditioning 12/22/2010   Pulmonary edema 11/19/2010   Respiratory failure ([place]) 11/19/2010   Shortness of breath    Stroke ([place]) 11/19/2010   Thyroiditis 11/20/2010    Past Surgical History:  Procedure Laterality Date   PEG PLACEMENT  12/03/2010   Procedure: PERCUTANEOUS ENDOSCOPIC GASTROSTOMY (PEG) PLACEMENT;  Surgeon: Lafayette Dragon, MD;  Location[facility] ENDOSCOPY;  Service: Endoscopy;   Laterality: N/A;   TRACHEOSTOMY TUBE PLACEMENT  11/28/2010   Procedure: TRACHEOSTOMY;  Surgeon: Beckie Salts, MD;  Location: [facility];  Service: ENT;  Laterality: N/A;    No Known Allergies  Outpatient Encounter Medications as of 04/09/2022  Medication Sig   Alcohol Swabs (ALCOHOL PREP) 70 % PADS Use to test blood sugar daily. Dx: E11.21   apixaban (ELIQUIS) 5 MG TABS tablet Take 1 tablet (5 mg total) by mouth 2 (two) times daily.   blood glucose meter kit and supplies Dispense based on patient and insurance preference. Use up to four times daily as directed. (FOR ICD-10 E10.9, E11.9).   carvedilol (COREG) 25 MG tablet Take 1 tablet (25 mg total) by mouth 2 (two) times daily with a meal. Needs an appointment before anymore future refills.   diltiazem (CARDIZEM CD) 180 MG 24 hr capsule TAKE 2 CAPSULES BY MOUTH  DAILY AT NOON   empagliflozin (JARDIANCE) 10 MG TABS tablet Take 1 tablet (10 mg total) by mouth daily.   furosemide (LASIX) 40 MG tablet Take 40 mg by mouth as needed.   glucose blood (ONETOUCH ULTRA) test strip Use to test blood sugar daily. Dx: E11.21   isosorbide mononitrate (IMDUR) 30 MG 24 hr tablet Take 1 tablet (30 mg total) by mouth daily.   losartan (COZAAR) 25 MG tablet Take 1 tablet (25 mg total) by mouth daily.   metFORMIN (GLUCOPHAGE) 850 MG tablet Take 1 tablet (850 mg total) by mouth daily  with breakfast.   Multiple Vitamins-Minerals (MENS ONE DAILY PO) Take 1 tablet by mouth daily.   ONETOUCH DELICA LANCETS 99991111 MISC Use as directed to test blood sugar once daily   pantoprazole (PROTONIX) 40 MG tablet Take 1 tablet (40 mg total) by mouth daily.   pravastatin (PRAVACHOL) 40 MG tablet Take 1 tablet (40 mg total) by mouth daily.   sitaGLIPtin (JANUVIA) 50 MG tablet Take 1 tablet (50 mg total) by mouth daily.   spironolactone (ALDACTONE) 25 MG tablet TAKE 1 TABLET BY MOUTH DAILY   tamsulosin (FLOMAX) 0.4 MG CAPS capsule Take 0.4 mg by mouth daily.   triamcinolone ointment  (KENALOG) 0.5 % Apply 1 Application topically 2 (two) times daily for 14 days.   [DISCONTINUED] oxybutynin (DITROPAN-XL) 5 MG 24 hr tablet Take 1 tablet (5 mg total) by mouth at bedtime.   No facility-administered encounter medications on file as of 04/09/2022.    Review of Systems:  Review of Systems  Constitutional:  Negative for activity change, appetite change and fever.  HENT:  Negative for sore throat.   Eyes: Negative.   Cardiovascular:  Negative for chest pain and leg swelling.  Gastrointestinal:  Negative for abdominal distention, diarrhea and vomiting.  Genitourinary:  Positive for decreased urine volume, difficulty urinating and dysuria. Negative for frequency and urgency.  Skin:  Negative for color change.  Neurological:  Negative for dizziness and headaches.  Psychiatric/Behavioral:  Negative for behavioral problems and sleep disturbance. The patient is not nervous/anxious.     Health Maintenance  Topic Date Due   Medicare Annual Wellness (AWV)  Never done   DTaP/Tdap/Td (1 - Tdap) Never done   Zoster Vaccines- Shingrix (2 of 2) 08/14/2015   OPHTHALMOLOGY EXAM  06/23/2016   Diabetic kidney evaluation - Urine ACR  05/05/2018   COLONOSCOPY (Pts 45-54yrs Insurance coverage will need to be confirmed)  02/01/2019   Pneumonia Vaccine 30+ Years old (2 of 2 - PCV) 07/30/2019   COVID-19 Vaccine (4 - 2023-24 season) 09/12/2021   HEMOGLOBIN A1C  10/02/2022   FOOT EXAM  03/12/2023   Diabetic kidney evaluation - eGFR measurement  04/01/2023   INFLUENZA VACCINE  Completed   Hepatitis C Screening  Completed   HPV VACCINES  Aged Out    Physical Exam: Vitals:   04/09/22 1045  BP: 130/80  Pulse: 84  Resp: 18  Temp: 98.4 F (36.9 C)  SpO2: 96%  Weight: 222 lb 2 oz (100.8 kg)  Height: 5\' 11"  (1.803 m)   Body mass index is 30.98 kg/m. Physical Exam Constitutional:      Appearance: Normal appearance.  HENT:     Head: Normocephalic and atraumatic.     Mouth/Throat:      Mouth: Mucous membranes are moist.  Eyes:     Conjunctiva/sclera: Conjunctivae normal.  Cardiovascular:     Rate and Rhythm: Normal rate and regular rhythm.     Pulses: Normal pulses.     Heart sounds: Normal heart sounds.  Pulmonary:     Effort: Pulmonary effort is normal.     Breath sounds: Normal breath sounds.  Abdominal:     General: Bowel sounds are normal.     Palpations: Abdomen is soft.  Musculoskeletal:        General: Swelling present.     Cervical back: Normal range of motion.     Right lower leg: Edema present.     Left lower leg: Edema present.     Comments: LLE weakness BLE 1+edema  Skin:    General: Skin is warm and dry.     Findings: Rash present.     Comments: Dry rashes on LUE   Neurological:     General: No focal deficit present.     Mental Status: He is alert and oriented to person, place, and time.  Psychiatric:        Mood and Affect: Mood normal.        Behavior: Behavior normal.        Thought Content: Thought content normal.        Judgment: Judgment normal.     Labs reviewed: Basic Metabolic Panel: Recent Labs    11/26/21 1335 04/01/22 1445  NA 134* 135  K 4.8 5.1  CL 98 99  CO2 24 26  GLUCOSE 136 133*  BUN 36* 29*  CREATININE 1.68* 1.57*  CALCIUM 9.8 10.6*   Liver Function Tests: Recent Labs    11/26/21 1335  AST 14  ALT 12  BILITOT 0.4  PROT 7.5   No results for input(s): "LIPASE", "AMYLASE" in the last 8760 hours. No results for input(s): "AMMONIA" in the last 8760 hours. CBC: No results for input(s): "WBC", "NEUTROABS", "HGB", "HCT", "MCV", "PLT" in the last 8760 hours. Lipid Panel: Recent Labs    11/26/21 1335  CHOL 139  HDL 53  LDLCALC 64  TRIG 137  CHOLHDL 2.6   Lab Results  Component Value Date   HGBA1C 8.3 (H) 04/01/2022    Procedures since last visit: No results found.  Assessment/Plan  1. Dysuria  Urinary tract infection without hematuria, site unspecified -  Urine Culture - POCT urinalysis  dipstick showed large blood, large leukocyte and negative nitrite -  will start on Bactrim DS BID X 7 days  2. Benign prostatic hyperplasia with urinary retention -  continue Flomax  3. Eczema, unspecified type - triamcinolone ointment (KENALOG) 0.5 %; Apply 1 Application topically 2 (two) times daily for 14 days. (Patient taking differently: Apply 1 Application topically 2 (two) times daily as needed (irritation).)  Dispense: 28 g; Refill: 0     Labs/tests ordered:  Urine dipstick and culture  Next appt:  as needed

## 2022-04-09 NOTE — Patient Instructions (Signed)
Urinary Tract Infection, Adult  A urinary tract infection (UTI) is an infection of any part of the urinary tract. The urinary tract includes the kidneys, ureters, bladder, and urethra. These organs make, store, and get rid of urine in the body. An upper UTI affects the ureters and kidneys. A lower UTI affects the bladder and urethra. What are the causes? Most urinary tract infections are caused by bacteria in your genital area around your urethra, where urine leaves your body. These bacteria grow and cause inflammation of your urinary tract. What increases the risk? You are more likely to develop this condition if: You have a urinary catheter that stays in place. You are not able to control when you urinate or have a bowel movement (incontinence). You are male and you: Use a spermicide or diaphragm for birth control. Have low estrogen levels. Are pregnant. You have certain genes that increase your risk. You are sexually active. You take antibiotic medicines. You have a condition that causes your flow of urine to slow down, such as: An enlarged prostate, if you are male. Blockage in your urethra. A kidney stone. A nerve condition that affects your bladder control (neurogenic bladder). Not getting enough to drink, or not urinating often. You have certain medical conditions, such as: Diabetes. A weak disease-fighting system (immunesystem). Sickle cell disease. Gout. Spinal cord injury. What are the signs or symptoms? Symptoms of this condition include: Needing to urinate right away (urgency). Frequent urination. This may include small amounts of urine each time you urinate. Pain or burning with urination. Blood in the urine. Urine that smells bad or unusual. Trouble urinating. Cloudy urine. Vaginal discharge, if you are male. Pain in the abdomen or the lower back. You may also have: Vomiting or a decreased appetite. Confusion. Irritability or tiredness. A fever or  chills. Diarrhea. The first symptom in older adults may be confusion. In some cases, they may not have any symptoms until the infection has worsened. How is this diagnosed? This condition is diagnosed based on your medical history and a physical exam. You may also have other tests, including: Urine tests. Blood tests. Tests for STIs (sexually transmitted infections). If you have had more than one UTI, a cystoscopy or imaging studies may be done to determine the cause of the infections. How is this treated? Treatment for this condition includes: Antibiotic medicine. Over-the-counter medicines to treat discomfort. Drinking enough water to stay hydrated. If you have frequent infections or have other conditions such as a kidney stone, you may need to see a health care provider who specializes in the urinary tract (urologist). In rare cases, urinary tract infections can cause sepsis. Sepsis is a life-threatening condition that occurs when the body responds to an infection. Sepsis is treated in the hospital with IV antibiotics, fluids, and other medicines. Follow these instructions at home:  Medicines Take over-the-counter and prescription medicines only as told by your health care provider. If you were prescribed an antibiotic medicine, take it as told by your health care provider. Do not stop using the antibiotic even if you start to feel better. General instructions Make sure you: Empty your bladder often and completely. Do not hold urine for long periods of time. Empty your bladder after sex. Wipe from front to back after urinating or having a bowel movement if you are male. Use each tissue only one time when you wipe. Drink enough fluid to keep your urine pale yellow. Keep all follow-up visits. This is important. Contact a health   care provider if: Your symptoms do not get better after 1-2 days. Your symptoms go away and then return. Get help right away if: You have severe pain in  your back or your lower abdomen. You have a fever or chills. You have nausea or vomiting. Summary A urinary tract infection (UTI) is an infection of any part of the urinary tract, which includes the kidneys, ureters, bladder, and urethra. Most urinary tract infections are caused by bacteria in your genital area. Treatment for this condition often includes antibiotic medicines. If you were prescribed an antibiotic medicine, take it as told by your health care provider. Do not stop using the antibiotic even if you start to feel better. Keep all follow-up visits. This is important. This information is not intended to replace advice given to you by your health care provider. Make sure you discuss any questions you have with your health care provider. Document Revised: 08/11/2019 Document Reviewed: 08/11/2019 Elsevier Patient Education  2023 [employer]  

## 2022-04-11 LAB — URINE CULTURE
MICRO NUMBER:: 14756524
SPECIMEN QUALITY:: ADEQUATE

## 2022-04-13 ENCOUNTER — Inpatient Hospital Stay (HOSPITAL_COMMUNITY)
Admission: EM | Admit: 2022-04-13 | Discharge: 2022-04-16 | DRG: 683 | Disposition: A | Discharge status: Home Health | Payer: Medicare Other | Attending: Internal Medicine | Admitting: Internal Medicine

## 2022-04-13 ENCOUNTER — Emergency Department (HOSPITAL_COMMUNITY): Payer: Medicare Other

## 2022-04-13 ENCOUNTER — Encounter (HOSPITAL_COMMUNITY): Payer: Self-pay | Admitting: Internal Medicine

## 2022-04-13 DIAGNOSIS — N136 Pyonephrosis: Secondary | ICD-10-CM | POA: Diagnosis present

## 2022-04-13 DIAGNOSIS — I5032 Chronic diastolic (congestive) heart failure: Secondary | ICD-10-CM | POA: Diagnosis not present

## 2022-04-13 DIAGNOSIS — E0821 Diabetes mellitus due to underlying condition with diabetic nephropathy: Secondary | ICD-10-CM | POA: Diagnosis not present

## 2022-04-13 DIAGNOSIS — R339 Retention of urine, unspecified: Secondary | ICD-10-CM | POA: Diagnosis not present

## 2022-04-13 DIAGNOSIS — Z7984 Long term (current) use of oral hypoglycemic drugs: Secondary | ICD-10-CM | POA: Diagnosis not present

## 2022-04-13 DIAGNOSIS — Z7901 Long term (current) use of anticoagulants: Secondary | ICD-10-CM | POA: Diagnosis not present

## 2022-04-13 DIAGNOSIS — N1832 Chronic kidney disease, stage 3b: Secondary | ICD-10-CM | POA: Diagnosis present

## 2022-04-13 DIAGNOSIS — I10 Essential (primary) hypertension: Secondary | ICD-10-CM | POA: Diagnosis present

## 2022-04-13 DIAGNOSIS — E8729 Other acidosis: Secondary | ICD-10-CM | POA: Insufficient documentation

## 2022-04-13 DIAGNOSIS — K219 Gastro-esophageal reflux disease without esophagitis: Secondary | ICD-10-CM | POA: Diagnosis not present

## 2022-04-13 DIAGNOSIS — I69351 Hemiplegia and hemiparesis following cerebral infarction affecting right dominant side: Secondary | ICD-10-CM

## 2022-04-13 DIAGNOSIS — R509 Fever, unspecified: Secondary | ICD-10-CM | POA: Diagnosis not present

## 2022-04-13 DIAGNOSIS — N183 Chronic kidney disease, stage 3 unspecified: Secondary | ICD-10-CM | POA: Diagnosis present

## 2022-04-13 DIAGNOSIS — N400 Enlarged prostate without lower urinary tract symptoms: Secondary | ICD-10-CM | POA: Diagnosis present

## 2022-04-13 DIAGNOSIS — N390 Urinary tract infection, site not specified: Secondary | ICD-10-CM | POA: Diagnosis not present

## 2022-04-13 DIAGNOSIS — I700 Atherosclerosis of aorta: Secondary | ICD-10-CM | POA: Diagnosis not present

## 2022-04-13 DIAGNOSIS — I2510 Atherosclerotic heart disease of native coronary artery without angina pectoris: Secondary | ICD-10-CM | POA: Diagnosis present

## 2022-04-13 DIAGNOSIS — E1122 Type 2 diabetes mellitus with diabetic chronic kidney disease: Secondary | ICD-10-CM | POA: Diagnosis not present

## 2022-04-13 DIAGNOSIS — I4821 Permanent atrial fibrillation: Secondary | ICD-10-CM | POA: Diagnosis not present

## 2022-04-13 DIAGNOSIS — R109 Unspecified abdominal pain: Secondary | ICD-10-CM | POA: Diagnosis not present

## 2022-04-13 DIAGNOSIS — E871 Hypo-osmolality and hyponatremia: Secondary | ICD-10-CM | POA: Diagnosis not present

## 2022-04-13 DIAGNOSIS — Z79899 Other long term (current) drug therapy: Secondary | ICD-10-CM

## 2022-04-13 DIAGNOSIS — K5900 Constipation, unspecified: Secondary | ICD-10-CM | POA: Diagnosis not present

## 2022-04-13 DIAGNOSIS — Z87891 Personal history of nicotine dependence: Secondary | ICD-10-CM | POA: Diagnosis not present

## 2022-04-13 DIAGNOSIS — N139 Obstructive and reflux uropathy, unspecified: Secondary | ICD-10-CM | POA: Diagnosis not present

## 2022-04-13 DIAGNOSIS — E7800 Pure hypercholesterolemia, unspecified: Secondary | ICD-10-CM

## 2022-04-13 DIAGNOSIS — R319 Hematuria, unspecified: Secondary | ICD-10-CM

## 2022-04-13 DIAGNOSIS — I130 Hypertensive heart and chronic kidney disease with heart failure and stage 1 through stage 4 chronic kidney disease, or unspecified chronic kidney disease: Secondary | ICD-10-CM | POA: Diagnosis not present

## 2022-04-13 DIAGNOSIS — B954 Other streptococcus as the cause of diseases classified elsewhere: Secondary | ICD-10-CM | POA: Diagnosis not present

## 2022-04-13 DIAGNOSIS — K5909 Other constipation: Secondary | ICD-10-CM | POA: Diagnosis not present

## 2022-04-13 DIAGNOSIS — Z833 Family history of diabetes mellitus: Secondary | ICD-10-CM

## 2022-04-13 DIAGNOSIS — N179 Acute kidney failure, unspecified: Secondary | ICD-10-CM | POA: Diagnosis not present

## 2022-04-13 DIAGNOSIS — E669 Obesity, unspecified: Secondary | ICD-10-CM | POA: Diagnosis not present

## 2022-04-13 DIAGNOSIS — R1084 Generalized abdominal pain: Secondary | ICD-10-CM

## 2022-04-13 DIAGNOSIS — E785 Hyperlipidemia, unspecified: Secondary | ICD-10-CM | POA: Diagnosis present

## 2022-04-13 DIAGNOSIS — N320 Bladder-neck obstruction: Secondary | ICD-10-CM | POA: Diagnosis present

## 2022-04-13 DIAGNOSIS — N19 Unspecified kidney failure: Secondary | ICD-10-CM | POA: Insufficient documentation

## 2022-04-13 DIAGNOSIS — Z6830 Body mass index (BMI) 30.0-30.9, adult: Secondary | ICD-10-CM | POA: Diagnosis not present

## 2022-04-13 DIAGNOSIS — I6930 Unspecified sequelae of cerebral infarction: Secondary | ICD-10-CM

## 2022-04-13 DIAGNOSIS — E872 Acidosis, unspecified: Secondary | ICD-10-CM | POA: Diagnosis present

## 2022-04-13 DIAGNOSIS — N134 Hydroureter: Secondary | ICD-10-CM | POA: Diagnosis not present

## 2022-04-13 DIAGNOSIS — I69322 Dysarthria following cerebral infarction: Secondary | ICD-10-CM

## 2022-04-13 LAB — HIV ANTIBODY (ROUTINE TESTING W REFLEX): HIV Screen 4th Generation wRfx: NONREACTIVE

## 2022-04-13 LAB — BASIC METABOLIC PANEL
Anion gap: 17 — ABNORMAL HIGH (ref 5–15)
Anion gap: 14 (ref 5–15)
BUN: 98 mg/dL — ABNORMAL HIGH (ref 8–23)
BUN: 87 mg/dL — ABNORMAL HIGH (ref 8–23)
CO2: 19 mmol/L — ABNORMAL LOW (ref 22–32)
CO2: 21 mmol/L — ABNORMAL LOW (ref 22–32)
Calcium: 9.6 mg/dL (ref 8.9–10.3)
Calcium: 9.2 mg/dL (ref 8.9–10.3)
Chloride: 96 mmol/L — ABNORMAL LOW (ref 98–111)
Chloride: 96 mmol/L — ABNORMAL LOW (ref 98–111)
Creatinine, Ser: 7.69 mg/dL — ABNORMAL HIGH (ref 0.61–1.24)
Creatinine, Ser: 6.47 mg/dL — ABNORMAL HIGH (ref 0.61–1.24)
GFR, Estimated: 7 mL/min — ABNORMAL LOW (ref >60)
GFR, Estimated: 9 mL/min — ABNORMAL LOW (ref >60)
Glucose, Bld: 209 mg/dL — ABNORMAL HIGH (ref 70–99)
Glucose, Bld: 271 mg/dL — ABNORMAL HIGH (ref 70–99)
Potassium: 5.1 mmol/L (ref 3.5–5.1)
Potassium: 4.4 mmol/L (ref 3.5–5.1)
Sodium: 132 mmol/L — ABNORMAL LOW (ref 135–145)
Sodium: 131 mmol/L — ABNORMAL LOW (ref 135–145)

## 2022-04-13 LAB — CBC
HCT: 42.5 % (ref 39.0–52.0)
Hemoglobin: 13.9 g/dL (ref 13.0–17.0)
MCH: 29.4 pg (ref 26.0–34.0)
MCHC: 32.7 g/dL (ref 30.0–36.0)
MCV: 90 fL (ref 80.0–100.0)
Platelets: 210 10*3/uL (ref 150–400)
RBC: 4.72 MIL/uL (ref 4.22–5.81)
RDW: 15.2 % (ref 11.5–15.5)
WBC: 12.2 10*3/uL — ABNORMAL HIGH (ref 4.0–10.5)
nRBC: 0 % (ref 0.0–0.2)

## 2022-04-13 LAB — URINALYSIS, ROUTINE W REFLEX MICROSCOPIC
Bilirubin Urine: NEGATIVE
Glucose, UA: >=500 mg/dL — AB
Ketones, ur: NEGATIVE mg/dL
Nitrite: NEGATIVE
Protein, ur: 30 mg/dL — AB
RBC / HPF: >50 RBC/hpf (ref 0–5)
Specific Gravity, Urine: 1.012 (ref 1.005–1.030)
pH: 5 (ref 5.0–8.0)

## 2022-04-13 LAB — COMPREHENSIVE METABOLIC PANEL
ALT: 16 U/L (ref 0–44)
AST: 22 U/L (ref 15–41)
Albumin: 3.2 g/dL — ABNORMAL LOW (ref 3.5–5.0)
Alkaline Phosphatase: 56 U/L (ref 38–126)
Anion gap: 18 — ABNORMAL HIGH (ref 5–15)
BUN: 111 mg/dL — ABNORMAL HIGH (ref 8–23)
CO2: 17 mmol/L — ABNORMAL LOW (ref 22–32)
Calcium: 8.9 mg/dL (ref 8.9–10.3)
Chloride: 90 mmol/L — ABNORMAL LOW (ref 98–111)
Creatinine, Ser: 9.81 mg/dL — ABNORMAL HIGH (ref 0.61–1.24)
GFR, Estimated: 5 mL/min — ABNORMAL LOW (ref >60)
Glucose, Bld: 177 mg/dL — ABNORMAL HIGH (ref 70–99)
Potassium: 4.7 mmol/L (ref 3.5–5.1)
Sodium: 125 mmol/L — ABNORMAL LOW (ref 135–145)
Total Bilirubin: 0.5 mg/dL (ref 0.3–1.2)
Total Protein: 7.4 g/dL (ref 6.5–8.1)

## 2022-04-13 LAB — PHOSPHORUS: Phosphorus: 5 mg/dL — ABNORMAL HIGH (ref 2.5–4.6)

## 2022-04-13 LAB — LIPASE, BLOOD: Lipase: 29 U/L (ref 11–51)

## 2022-04-13 LAB — GLUCOSE, CAPILLARY
Glucose-Capillary: 199 mg/dL — ABNORMAL HIGH (ref 70–99)
Glucose-Capillary: 221 mg/dL — ABNORMAL HIGH (ref 70–99)

## 2022-04-13 LAB — MAGNESIUM: Magnesium: 2.5 mg/dL — ABNORMAL HIGH (ref 1.7–2.4)

## 2022-04-13 MED ORDER — ACETAMINOPHEN 650 MG RE SUPP: 650.0000 mg | Freq: Four times a day (QID) | RECTAL | Status: DC | PRN

## 2022-04-13 MED ORDER — SODIUM CHLORIDE 0.9 % IV SOLN
1.0000 g | INTRAVENOUS | Status: DC
  Administered 2022-04-13 – 2022-04-15 (×3): 1 g via INTRAVENOUS
  Filled 2022-04-13 (×4): qty 10

## 2022-04-13 MED ORDER — ACETAMINOPHEN 325 MG PO TABS: 650.0000 mg | ORAL_TABLET | Freq: Four times a day (QID) | ORAL | Status: DC | PRN

## 2022-04-13 MED ORDER — POLYETHYLENE GLYCOL 3350 17 G PO PACK: 17.0000 g | PACK | Freq: Every day | ORAL | Status: DC | PRN

## 2022-04-13 MED ORDER — FINASTERIDE 5 MG PO TABS
5.0000 mg | ORAL_TABLET | Freq: Every day | ORAL | Status: DC
  Administered 2022-04-13 – 2022-04-16 (×4): 5 mg via ORAL
  Filled 2022-04-13 (×4): qty 1

## 2022-04-13 MED ORDER — CARVEDILOL 12.5 MG PO TABS
25.0000 mg | ORAL_TABLET | Freq: Two times a day (BID) | ORAL | Status: DC
  Administered 2022-04-13 – 2022-04-16 (×6): 25 mg via ORAL
  Filled 2022-04-13 (×6): qty 2

## 2022-04-13 MED ORDER — DILTIAZEM HCL ER COATED BEADS 180 MG PO CP24
360.0000 mg | ORAL_CAPSULE | Freq: Every day | ORAL | Status: DC
  Administered 2022-04-14 – 2022-04-16 (×3): 360 mg via ORAL
  Filled 2022-04-13: qty 1
  Filled 2022-04-13: qty 2
  Filled 2022-04-13: qty 1
  Filled 2022-04-13 (×2): qty 2
  Filled 2022-04-13: qty 1

## 2022-04-13 MED ORDER — TAMSULOSIN HCL 0.4 MG PO CAPS
0.4000 mg | ORAL_CAPSULE | Freq: Every day | ORAL | Status: DC
  Administered 2022-04-13 – 2022-04-15 (×3): 0.4 mg via ORAL
  Filled 2022-04-13 (×3): qty 1

## 2022-04-13 MED ORDER — PANTOPRAZOLE SODIUM 40 MG PO TBEC
40.0000 mg | DELAYED_RELEASE_TABLET | Freq: Every day | ORAL | Status: DC
  Administered 2022-04-13 – 2022-04-15 (×3): 40 mg via ORAL
  Filled 2022-04-13 (×3): qty 1

## 2022-04-13 MED ORDER — APIXABAN 5 MG PO TABS
5.0000 mg | ORAL_TABLET | Freq: Two times a day (BID) | ORAL | Status: DC
  Administered 2022-04-13 – 2022-04-16 (×6): 5 mg via ORAL
  Filled 2022-04-13 (×6): qty 1

## 2022-04-13 MED ORDER — INSULIN ASPART 100 UNIT/ML IJ SOLN
0.0000 [IU] | Freq: Three times a day (TID) | INTRAMUSCULAR | Status: DC
  Administered 2022-04-13: 1 [IU] via SUBCUTANEOUS
  Administered 2022-04-14: 3 [IU] via SUBCUTANEOUS
  Administered 2022-04-14: 1 [IU] via SUBCUTANEOUS
  Administered 2022-04-14: 2 [IU] via SUBCUTANEOUS
  Administered 2022-04-15 – 2022-04-16 (×5): 1 [IU] via SUBCUTANEOUS

## 2022-04-13 MED ORDER — ISOSORBIDE MONONITRATE ER 30 MG PO TB24
30.0000 mg | ORAL_TABLET | Freq: Every day | ORAL | Status: DC
  Administered 2022-04-13 – 2022-04-15 (×3): 30 mg via ORAL
  Filled 2022-04-13 (×3): qty 1

## 2022-04-13 MED ORDER — SODIUM CHLORIDE 0.9% FLUSH
3.0000 mL | Freq: Two times a day (BID) | INTRAVENOUS | Status: DC
  Administered 2022-04-13 – 2022-04-16 (×6): 3 mL via INTRAVENOUS

## 2022-04-13 MED ORDER — SODIUM CHLORIDE 0.9 % IV BOLUS
1000.0000 mL | Freq: Once | INTRAVENOUS | Status: AC
  Administered 2022-04-13: 1000 mL via INTRAVENOUS

## 2022-04-13 MED ORDER — LACTATED RINGERS IV BOLUS
500.0000 mL | Freq: Once | INTRAVENOUS | Status: AC
  Administered 2022-04-13: 500 mL via INTRAVENOUS

## 2022-04-13 MED ORDER — PRAVASTATIN SODIUM 40 MG PO TABS
40.0000 mg | ORAL_TABLET | Freq: Every day | ORAL | Status: DC
  Administered 2022-04-13 – 2022-04-15 (×3): 40 mg via ORAL
  Filled 2022-04-13 (×3): qty 1

## 2022-04-13 NOTE — ED Notes (Signed)
ED TO INPATIENT HANDOFF REPORT  ED Nurse Name and Phone #: Duanne Guess RN [phone]  S Name/Age/Gender [name] 67 y.o. male Room/Bed: 020C/020C  Code Status   Code Status: Full Code  Home/SNF/Other Home Patient oriented to: self, place, time, and situation Is this baseline? Yes   Triage Complete: Triage complete  Chief Complaint AKI (acute kidney injury) [N17.9]  Triage Note Pt BIB from home with c/o abdominal pain x 3 days and decreased urine output since Thursday. Pt also hasn't had BM in 4 days. Seen by PCP on Thursday and started on bactrim for UTI.    Allergies No Known Allergies  Level of Care/Admitting Diagnosis ED Disposition     ED Disposition  Admit   Condition  --   Comment  Hospital Area: [facility] [[id number]]  Level of Care: Telemetry Medical [104]  May place patient in observation at [facility] or [facility] if equivalent level of care is available:: No  Covid Evaluation: Asymptomatic - no recent exposure (last 10 days) testing not required  Diagnosis: AKI (acute kidney injury) [id number]  Admitting Physician: Marcelyn Bruins [id number]  Attending Physician: Marcelyn Bruins [id number]          B Medical/Surgery History Past Medical History:  Diagnosis Date   A-fib 11/19/2010   Acute exacerbation of congestive heart failure 11/19/2010   Arthritis    CAD (coronary artery disease) 11/20/2010   CHF (congestive heart failure)    CKD (chronic kidney disease) stage 3, GFR 30-59 ml/min 05/04/2017   Coronary artery disease    Diabetes type 2, controlled 11/19/2010   GERD (gastroesophageal reflux disease) 02/08/2015   Gout    Gout 11/20/2010   History of cerebrovascular accident (CVA) with residual deficit 05/04/2017   History of CVA (cerebrovascular accident) 11/19/2010   HTN (hypertension) 07/01/2012   Hyperlipemia 11/21/2010   Hypertension    Hypertensive emergency 11/19/2010   ICH (intracerebral hemorrhage)  12/22/2010   Physical deconditioning 12/22/2010   Pulmonary edema 11/19/2010   Respiratory failure 11/19/2010   Shortness of breath    Stroke 11/19/2010   Thyroiditis 11/20/2010   Past Surgical History:  Procedure Laterality Date   PEG PLACEMENT  12/03/2010   Procedure: PERCUTANEOUS ENDOSCOPIC GASTROSTOMY (PEG) PLACEMENT;  Surgeon: Lafayette Dragon, MD;  Location: [facility] ENDOSCOPY;  Service: Endoscopy;  Laterality: N/A;   TRACHEOSTOMY TUBE PLACEMENT  11/28/2010   Procedure: TRACHEOSTOMY;  Surgeon: Beckie Salts, MD;  Location: [place] [facility];  Service: ENT;  Laterality: N/A;     A IV Location/Drains/Wounds Patient Lines/Drains/Airways Status     Active Line/Drains/Airways     Name Placement date Placement time Site Days   Peripheral IV 04/13/22 20 G Posterior;Right Forearm 04/13/22  0750  Forearm  less than 1   CVC Triple Lumen 11/22/10 Right Subclavian 11/22/10  2029  -- 4160   External Urinary Catheter 12/23/10  1430  --  4129   External Urinary Catheter 04/13/22  1009  --  less than 1   AIRWAYS --  --  -- --   Airway 7.5 mm 11/19/10  1740  -- 4163   Airway --  --  -- --   Incision 12/05/10 Head 12/05/10  1741  -- 4147   Incision 01/01/11 Neck Anterior 01/01/11  0810  -- 4120   Wound 01/15/11 Other (Comment) Abdomen Medial 01/15/11  1116  Abdomen  4106            Intake/Output Last 24 hours  Intake/Output  Summary (Last 24 hours) at 04/13/2022 1334 Last data filed at 04/13/2022 1220 Gross per 24 hour  Intake 500 ml  Output 3600 ml  Net -3100 ml    Labs/Imaging Results for orders placed or performed during the hospital encounter of 04/13/22 (from the past 48 hour(s))  CBC     Status: Abnormal   Collection Time: 04/13/22  7:56 AM  Result Value Ref Range   WBC 12.2 (H) 4.0 - 10.5 K/uL   RBC 4.72 4.22 - 5.81 MIL/uL   Hemoglobin 13.9 13.0 - 17.0 g/dL   HCT 42.5 39.0 - 52.0 %   MCV 90.0 80.0 - 100.0 fL   MCH 29.4 26.0 - 34.0 pg   MCHC 32.7 30.0 - 36.0 g/dL   RDW 15.2 11.5 - 15.5  %   Platelets 210 150 - 400 K/uL   nRBC 0.0 0.0 - 0.2 %    Comment: Performed at [facility], [place] [address], [place], [place] [address]  Comprehensive metabolic panel     Status: Abnormal   Collection Time: 04/13/22  7:56 AM  Result Value Ref Range   Sodium 125 (L) 135 - 145 mmol/L   Potassium 4.7 3.5 - 5.1 mmol/L   Chloride 90 (L) 98 - 111 mmol/L   CO2 17 (L) 22 - 32 mmol/L   Glucose, Bld 177 (H) 70 - 99 mg/dL    Comment: Glucose reference range applies only to samples taken after fasting for at least 8 hours.   BUN 111 (H) 8 - 23 mg/dL   Creatinine, Ser 9.81 (H) 0.61 - 1.24 mg/dL   Calcium 8.9 8.9 - 10.3 mg/dL   Total Protein 7.4 6.5 - 8.1 g/dL   Albumin 3.2 (L) 3.5 - 5.0 g/dL   AST 22 15 - 41 U/L   ALT 16 0 - 44 U/L   Alkaline Phosphatase 56 38 - 126 U/L   Total Bilirubin 0.5 0.3 - 1.2 mg/dL   GFR, Estimated 5 (L) >60 mL/min    Comment: (NOTE) Calculated using the CKD-EPI Creatinine Equation (2021)    Anion gap 18 (H) 5 - 15    Comment: Performed at [facility], [place] [address], [place], [place] [address]  Lipase, blood     Status: None   Collection Time: 04/13/22  7:56 AM  Result Value Ref Range   Lipase 29 11 - 51 U/L    Comment: Performed at [place] [address], [place], [place] [id number]  Urinalysis, Routine w reflex microscopic -Urine, Clean Catch     Status: Abnormal   Collection Time: 04/13/22 10:09 AM  Result Value Ref Range   Color, Urine YELLOW YELLOW   APPearance CLOUDY (A) CLEAR   Specific Gravity, Urine 1.012 1.005 - 1.030   pH 5.0 5.0 - 8.0   Glucose, UA >=500 (A) NEGATIVE mg/dL   Hgb urine dipstick LARGE (A) NEGATIVE   Bilirubin Urine NEGATIVE NEGATIVE   Ketones, ur NEGATIVE NEGATIVE mg/dL   Protein, ur 30 (A) NEGATIVE mg/dL   Nitrite NEGATIVE NEGATIVE   Leukocytes,Ua LARGE (A) NEGATIVE   RBC / HPF >50 0 - 5 RBC/hpf   WBC, UA 0-5 0 - 5 WBC/hpf   Bacteria, UA RARE (A) NONE SEEN   Squamous Epithelial / HPF 0-5 0 - 5 /HPF     Comment: Performed at [facility], [address], [place], [place] [address]   CT ABDOMEN PELVIS WO CONTRAST  Result Date: 04/13/2022  CLINICAL DATA:  Abdominal pain, acute, nonlocalized abd pain, distension, elevated creatinine. r/o acute abd process, r/o urinary obstructive process. EXAM: CT ABDOMEN AND PELVIS WITHOUT CONTRAST TECHNIQUE: Multidetector CT imaging of the abdomen and pelvis was performed following the standard protocol without IV contrast. RADIATION DOSE REDUCTION: This exam was performed according to the departmental dose-optimization program which includes automated exposure control, adjustment of the mA and/or kV according to patient size and/or use of iterative reconstruction technique. COMPARISON:  None Available. FINDINGS: Lower chest: Dependent atelectasis in the lung bases. Coronary artery calcifications. Small hiatal hernia. Hepatobiliary: No focal liver abnormality is seen. No gallstones, gallbladder wall thickening, or biliary dilatation. Pancreas: Unremarkable. No pancreatic ductal dilatation or surrounding inflammatory changes. Spleen: Normal in size without focal abnormality. Adrenals/Urinary Tract: Distended bladder with surrounding fat stranding, associated mild bilateral hydroureteronephrosis and moderate perinephric stranding. No obstructing stone. Associated prostatomegaly. Stomach/Bowel: No dilated loops of small bowel. Mild gaseous distention of the redundant sigmoid colon. No bowel wall thickening or surrounding inflammation. Vascular/Lymphatic: Aortic atherosclerosis. No enlarged abdominal or pelvic lymph nodes. Reproductive: Enlarged prostate. Other: No abdominal wall hernia or abnormality. Trace fluid along the bilateral paracolic gutters. Musculoskeletal: Bilateral L5 spondylolysis with associated grade 1 anterolisthesis of L5 on S1. No suspicious bone lesions. IMPRESSION: 1. Distended bladder with surrounding fat stranding, associated mild bilateral  hydroureteronephrosis and moderate perinephric stranding. No obstructing stone. Findings are concerning for cystitis with possible ascending urinary tract infection in the setting of chronic bladder outlet obstruction due to prostatomegaly. Correlate with urinalysis. 2. Bilateral L5 spondylolysis with associated grade 1 anterolisthesis of L5 on S1. 3. Aortic Atherosclerosis (ICD10-I70.0). Electronically Signed   By: Emmit Alexanders M.D.   On: 04/13/2022 09:58    Pending Labs Unresulted Labs (From admission, onward)     Start     Ordered   04/14/22 0500  CBC  Tomorrow morning,   R        04/13/22 1246   04/13/22 [id number]  Basic metabolic panel  Now then every 4 hours,   R (with TIMED occurrences)      04/13/22 1246   04/13/22 1244  Magnesium  Add-on,   AD        04/13/22 1246   04/13/22 1244  Phosphorus  Add-on,   AD        04/13/22 1246   04/13/22 1241  HIV Antibody (routine testing w rflx)  (HIV Antibody (Routine testing w reflex) panel)  Once,   R        04/13/22 1246            Vitals/Pain Today's Vitals   04/13/22 0609 04/13/22 0620 04/13/22 1015  BP:  113/78 111/84  Pulse:  77 81  Resp:  (!) 22 18  Temp:  97.8 F (36.6 C) 98.7 F (37.1 C)  TempSrc:  Oral Oral  SpO2: 96% 97% 98%    Isolation Precautions No active isolations  Medications Medications  sodium chloride flush (NS) 0.9 % injection 3 mL (has no administration in time range)  acetaminophen (TYLENOL) tablet 650 mg (has no administration in time range)    Or  acetaminophen (TYLENOL) suppository 650 mg (has no administration in time range)  polyethylene glycol (MIRALAX / GLYCOLAX) packet 17 g (has no administration in time range)  cefTRIAXone (ROCEPHIN) 1 g in sodium chloride 0.9 % 100 mL IVPB (has no administration in time range)  lactated ringers bolus 500 mL (0 mLs Intravenous Stopped 04/13/22 1000)  sodium chloride 0.9 % bolus  1,000 mL (1,000 mLs Intravenous New Bag/Given 04/13/22 1000)    Mobility walks with  device     Focused Assessments GU/GI   R Recommendations: See Admitting Provider Note  Report given to:   Additional Notes: urinary retention

## 2022-04-13 NOTE — H&P (Signed)
History and Physical   [name] [id number] DOB: May 10, 1955 DOA: 04/13/2022  PCP: Wardell Honour, MD   Patient coming from: Home  Chief Complaint: Abdominal pain  HPI: [name] is a 67 y.o. male with medical history significant of stroke, hyperlipidemia, hypertension, atrial fibrillation, GERD, gout, diabetes, CKD 3, diastolic CHF, CAD presenting with abdominal pain.  Patient has had ongoing issues with abdominal pain for the past several days.  He also reports some associated constipation in that timeframe but has continued to pass gas.  He additionally reports he was having some painful urination and was started on Bactrim for suspected UTI 3 to 4 days ago.  He reports some decreased p.o. intake secondary to above symptoms as well.  He denies fevers, chills, chest pain, shortness of breath, nausea, vomiting.  ED Course: Vital signs in the ED stable.  Lab workup included CMP with sodium 125, chloride 90, bicarb  17, gap 18, BUN 111, creatinine elevated to 9.81 from baseline 1.5 712 days ago, glucose 177, albumin 3.3.  CBC with leukocytosis to 12.2.  Lipase normal.  Urinalysis with glucose, hemoglobin, protein, leukocytes, rare bacteria.  CT abdomen pelvis showed distended bladder with fat stranding and bilateral mild hydroureter ureter nephrosis with perinephric fat stranding consistent with cystitis and associated ascending infection in the setting of bladder outlet obstruction secondary to prostatomegaly.  Patient received 1.5 L of IV fluids in the ED.  Review of Systems: As per HPI otherwise all other systems reviewed and are negative.  Past Medical History:  Diagnosis Date   A-fib 11/19/2010   Acute exacerbation of congestive heart failure 11/19/2010   Arthritis    CAD (coronary artery disease) 11/20/2010   CHF (congestive heart failure)    CKD (chronic kidney disease) stage 3, GFR 30-59 ml/min 05/04/2017   Coronary artery disease    Diabetes type 2, controlled  11/19/2010   GERD (gastroesophageal reflux disease) 02/08/2015   Gout    Gout 11/20/2010   History of cerebrovascular accident (CVA) with residual deficit 05/04/2017   History of CVA (cerebrovascular accident) 11/19/2010   HTN (hypertension) 07/01/2012   Hyperlipemia 11/21/2010   Hypertension    Hypertensive emergency 11/19/2010   ICH (intracerebral hemorrhage) 12/22/2010   Physical deconditioning 12/22/2010   Pulmonary edema 11/19/2010   Respiratory failure 11/19/2010   Shortness of breath    Stroke 11/19/2010   Thyroiditis 11/20/2010    Past Surgical History:  Procedure Laterality Date   PEG PLACEMENT  12/03/2010   Procedure: PERCUTANEOUS ENDOSCOPIC GASTROSTOMY (PEG) PLACEMENT;  Surgeon: Lafayette Dragon, MD;  Location: [facility] ENDOSCOPY;  Service: Endoscopy;  Laterality: N/A;   TRACHEOSTOMY TUBE PLACEMENT  11/28/2010   Procedure: TRACHEOSTOMY;  Surgeon: Beckie Salts, MD;  Location: [place];  Service: ENT;  Laterality: N/A;    Social History  reports that he quit smoking about 16 years ago. His smoking use included cigarettes. He has never used smokeless tobacco. He reports that he does not drink alcohol and does not use drugs.  No Known Allergies  Family History  Problem Relation Age of Onset   Diabetes Mother    Diabetes Brother    Colon cancer Neg Hx    Colon polyps Neg Hx    Kidney disease Neg Hx    Esophageal cancer Neg Hx    Heart disease Neg Hx    Gallbladder disease Neg Hx   Reviewed on admission  Prior to Admission medications   Medication Sig Start Date End Date  Taking? Authorizing Provider  apixaban (ELIQUIS) 5 MG TABS tablet Take 1 tablet (5 mg total) by mouth 2 (two) times daily. 03/30/22  Yes Nahser, Wonda Cheng, MD  carvedilol (COREG) 25 MG tablet Take 1 tablet (25 mg total) by mouth 2 (two) times daily with a meal. Needs an appointment before anymore future refills. 03/06/22  Yes Wardell Honour, MD  diltiazem (CARDIZEM CD) 180 MG 24 hr capsule TAKE 2 CAPSULES BY  MOUTH  DAILY AT NOON Patient taking differently: Take 180 mg by mouth See admin instructions. 180 mg twice daily at noon and in the evening 06/03/21  Yes Nahser, Wonda Cheng, MD  empagliflozin (JARDIANCE) 10 MG TABS tablet Take 1 tablet (10 mg total) by mouth daily. 02/02/22  Yes Wardell Honour, MD  Alcohol Swabs (ALCOHOL PREP) 70 % PADS Use to test blood sugar daily. Dx: E11.21 09/11/21   Wardell Honour, MD  blood glucose meter kit and supplies Dispense based on patient and insurance preference. Use up to four times daily as directed. (FOR ICD-10 E10.9, E11.9). 07/14/21   Wardell Honour, MD  glucose blood (ONETOUCH ULTRA) test strip Use to test blood sugar daily. Dx: E11.21 07/08/21   Wardell Honour, MD  isosorbide mononitrate (IMDUR) 30 MG 24 hr tablet Take 1 tablet (30 mg total) by mouth daily. 10/17/21   Wardell Honour, MD  losartan (COZAAR) 25 MG tablet Take 1 tablet (25 mg total) by mouth daily. 12/18/20   Nahser, Wonda Cheng, MD  metFORMIN (GLUCOPHAGE) 850 MG tablet Take 1 tablet (850 mg total) by mouth daily with breakfast. 02/02/22   Wardell Honour, MD  Multiple Vitamins-Minerals (MENS ONE DAILY PO) Take 1 tablet by mouth daily.    [provider]  Jonetta Speak LANCETS [id number] MISC Use as directed to test blood sugar once daily 12/06/17   Ladell Pier, MD  pantoprazole (PROTONIX) 40 MG tablet Take 1 tablet (40 mg total) by mouth daily. 12/08/21   Wardell Honour, MD  pravastatin (PRAVACHOL) 40 MG tablet Take 1 tablet (40 mg total) by mouth daily. 02/02/22   Wardell Honour, MD  sitaGLIPtin (JANUVIA) 50 MG tablet Take 1 tablet (50 mg total) by mouth daily. 10/17/21   Wardell Honour, MD  spironolactone (ALDACTONE) 25 MG tablet TAKE 1 TABLET BY MOUTH DAILY 03/03/22   Wardell Honour, MD  sulfamethoxazole-trimethoprim (BACTRIM DS) 800-160 MG tablet Take 1 tablet by mouth 2 (two) times daily for 7 days. 04/09/22 04/16/22  Medina-Vargas, Monina C, NP  tamsulosin (FLOMAX) 0.4 MG  CAPS capsule Take 0.4 mg by mouth daily.    [provider]  triamcinolone ointment (KENALOG) 0.5 % Apply 1 Application topically 2 (two) times daily for 14 days. 04/09/22 04/23/22  Medina-Vargas, Jaymes Graff C, NP    Physical Exam: Vitals:   04/13/22 0609 04/13/22 0620 04/13/22 1015  BP:  113/78 111/84  Pulse:  77 81  Resp:  (!) 22 18  Temp:  97.8 F (36.6 C) 98.7 F (37.1 C)  TempSrc:  Oral Oral  SpO2: 96% 97% 98%    Physical Exam Constitutional:      General: He is not in acute distress.    Appearance: Normal appearance.  HENT:     Head: Normocephalic and atraumatic.     Mouth/Throat:     Mouth: Mucous membranes are moist.     Pharynx: Oropharynx is clear.  Eyes:     Extraocular Movements: Extraocular movements intact.  Pupils: Pupils are equal, round, and reactive to light.  Cardiovascular:     Rate and Rhythm: Normal rate. Rhythm irregular.     Pulses: Normal pulses.     Heart sounds: Normal heart sounds.  Pulmonary:     Effort: Pulmonary effort is normal. No respiratory distress.     Breath sounds: Normal breath sounds.  Abdominal:     General: Bowel sounds are normal. There is no distension.     Palpations: Abdomen is soft.     Tenderness: There is no abdominal tenderness.  Genitourinary:    Comments: Foley in place Musculoskeletal:        General: No swelling or deformity.  Skin:    General: Skin is warm and dry.  Neurological:     General: No focal deficit present.     Mental Status: Mental status is at baseline.    Labs on Admission: I have personally reviewed following labs and imaging studies  CBC: Recent Labs  Lab 04/13/22 0756  WBC 12.2*  HGB 13.9  HCT 42.5  MCV 90.0  PLT [id number]    Basic Metabolic Panel: Recent Labs  Lab 04/13/22 0756  NA 125*  K 4.7  CL 90*  CO2 17*  GLUCOSE 177*  BUN 111*  CREATININE 9.81*  CALCIUM 8.9    GFR: Estimated Creatinine Clearance: 8.8 mL/min (A) (by C-G formula based on SCr of 9.81 mg/dL  (H)).  Liver Function Tests: Recent Labs  Lab 04/13/22 0756  AST 22  ALT 16  ALKPHOS 56  BILITOT 0.5  PROT 7.4  ALBUMIN 3.2*    Urine analysis:    Component Value Date/Time   COLORURINE YELLOW 04/13/2022 1009   APPEARANCEUR CLOUDY (A) 04/13/2022 1009   LABSPEC 1.012 04/13/2022 1009   PHURINE 5.0 04/13/2022 1009   GLUCOSEU >=500 (A) 04/13/2022 1009   HGBUR LARGE (A) 04/13/2022 1009   BILIRUBINUR NEGATIVE 04/13/2022 1009   BILIRUBINUR negative 04/09/2022 1149   KETONESUR NEGATIVE 04/13/2022 1009   PROTEINUR 30 (A) 04/13/2022 1009   UROBILINOGEN 0.2 04/09/2022 1149   UROBILINOGEN 0.2 09/02/2012 1251   NITRITE NEGATIVE 04/13/2022 1009   LEUKOCYTESUR LARGE (A) 04/13/2022 1009    Radiological Exams on Admission: CT ABDOMEN PELVIS WO CONTRAST  Result Date: 04/13/2022 CLINICAL DATA:  Abdominal pain, acute, nonlocalized abd pain, distension, elevated creatinine. r/o acute abd process, r/o urinary obstructive process. EXAM: CT ABDOMEN AND PELVIS WITHOUT CONTRAST TECHNIQUE: Multidetector CT imaging of the abdomen and pelvis was performed following the standard protocol without IV contrast. RADIATION DOSE REDUCTION: This exam was performed according to the departmental dose-optimization program which includes automated exposure control, adjustment of the mA and/or kV according to patient size and/or use of iterative reconstruction technique. COMPARISON:  None Available. FINDINGS: Lower chest: Dependent atelectasis in the lung bases. Coronary artery calcifications. Small hiatal hernia. Hepatobiliary: No focal liver abnormality is seen. No gallstones, gallbladder wall thickening, or biliary dilatation. Pancreas: Unremarkable. No pancreatic ductal dilatation or surrounding inflammatory changes. Spleen: Normal in size without focal abnormality. Adrenals/Urinary Tract: Distended bladder with surrounding fat stranding, associated mild bilateral hydroureteronephrosis and moderate perinephric  stranding. No obstructing stone. Associated prostatomegaly. Stomach/Bowel: No dilated loops of small bowel. Mild gaseous distention of the redundant sigmoid colon. No bowel wall thickening or surrounding inflammation. Vascular/Lymphatic: Aortic atherosclerosis. No enlarged abdominal or pelvic lymph nodes. Reproductive: Enlarged prostate. Other: No abdominal wall hernia or abnormality. Trace fluid along the bilateral paracolic gutters. Musculoskeletal: Bilateral L5 spondylolysis with associated grade 1 anterolisthesis of L5  on S1. No suspicious bone lesions. IMPRESSION: 1. Distended bladder with surrounding fat stranding, associated mild bilateral hydroureteronephrosis and moderate perinephric stranding. No obstructing stone. Findings are concerning for cystitis with possible ascending urinary tract infection in the setting of chronic bladder outlet obstruction due to prostatomegaly. Correlate with urinalysis. 2. Bilateral L5 spondylolysis with associated grade 1 anterolisthesis of L5 on S1. 3. Aortic Atherosclerosis (ICD10-I70.0). Electronically Signed   By: Emmit Alexanders M.D.   On: 04/13/2022 09:58    EKG: Not yet performed in emergency department.  Assessment/Plan Principal Problem:   Acute renal failure superimposed on stage 3b chronic kidney disease Active Problems:   Hyperlipemia   Permanent atrial fibrillation   CAD (coronary artery disease)   HTN (hypertension)   GERD (gastroesophageal reflux disease)   History of cerebrovascular accident (CVA) with residual deficit   CKD (chronic kidney disease) stage 3, GFR 30-59 ml/min   Chronic diastolic CHF (congestive heart failure)   Diabetes due to underlying condition w diabetic nephropathy   Hyponatremia   High anion gap metabolic acidosis   Uremia   UTI (urinary tract infection)   Bladder outlet obstruction   AKI on CKD 3B High anion gap metabolic acidosis Uremia Bladder outlet obstruction Hyponatremia > Patient presenting with  abdominal pain for the past several days.  Started on Bactrim for suspected UTI several days ago as well. > Found to have AKI with creatinine elevated to 9.81 from baseline of 1.512 days ago.  Also noted to have sodium 125, bicarb 17, gap 18, BUN 111. > CT on pelvis showed distended bladder with fat stranding as well as mild bilateral hydroureteronephrosis with perinephric fat stranding consistent with cystitis and ascending infection.  This is all noted to be secondary to bladder outlet obstruction in the setting of prostatomegaly. > Received 1.5 L in the ED.  Foley catheter placed with 1200 cc output. - Monitor on telemetry - Recheck BMP now and every 4 hours to trend sodium - IV fluid rate will depend on initial sodium response to IV fluids already given - Continue with Foley catheter - Check magnesium and phosphorus - Will benefit from urology follow up - Continue with tamsulosin - Add finasteride  Hypertension - Continue home carvedilol, diltiazem, Imdur - Holding home Lasix, spironolactone, losartan in the setting of AKI as above  Diastolic CHF > Last echo was in 2014 in our system with EF 0000000, diastolic function not commented on, normal RV function. - Continue home carvedilol, Imdur - Holding home Lasix, spironolactone, losartan in the setting of AKI as above  Hyperlipidemia History of CVA - Continue home pravastatin  CAD - Continue home carvedilol, Imdur, pravastatin - Holding home losartan as above  Diabetes - SSI  Atrial fibrillation - Continue home carvedilol, diltiazem, Eliquis  GERD - Continue home PPI  DVT prophylaxis: Eliquis Code Status:   Full  Family Communication:  None on admission  Disposition Plan:   Patient is from:  Home  Anticipated DC to:  Home  Anticipated DC date:  1 to 3 days  Anticipated DC barriers: None  Consults called:  None Admission status:  Observation, telemetry  Severity of Illness: The appropriate patient status for this  patient is OBSERVATION. Observation status is judged to be reasonable and necessary in order to provide the required intensity of service to ensure the patient's safety. The patient's presenting symptoms, physical exam findings, and initial radiographic and laboratory data in the context of their medical condition is felt to place them at  decreased risk for further clinical deterioration. Furthermore, it is anticipated that the patient will be medically stable for discharge from the hospital within 2 midnights of admission.    Marcelyn Bruins MD [facility]  How to contact the [facility] Attending or Consulting provider [place] or covering provider during after hours [place], for this patient?   Check the care team in [facility] and look for a) attending/consulting [facility] provider listed and b) the [facility] team listed Log into [email or web address] and use [place]'s universal password to access. If you do not have the password, please contact the hospital operator. Locate the [facility] provider you are looking for under [facility] and page to a number that you can be directly reached. If you still have difficulty reaching the provider, please page the [facility] (Director on Call) for the Hospitalists listed on amion for assistance.  04/13/2022, 2:25 PM

## 2022-04-13 NOTE — ED Provider Notes (Signed)
[facility] Provider Note   CSN: [id number] Arrival date & time: 04/13/22  [id number]     History  Chief Complaint  Patient presents with   Abdominal Pain   Urinary Retention   Constipation    [name] is a 67 y.o. male.  Pt c/o generalized abd pain x 3  days. Also notes constipation, with last normal/full bm being 3-4 days ago. Continues to pass gas, small amt stool. Feels bloated, distended. General abd pain, dull, cramping. No vomiting. Somewhat decreased po intake in past few days. Urinating less than normal, but feels is able to empty bladder. No dysuria. On bactrim for recent dx uti. No back/flank pain. No fever or chills.   The history is provided by the patient and medical records.  Abdominal Pain Associated symptoms: constipation   Associated symptoms: no chest pain, no chills, no cough, no dysuria, no fever, no shortness of breath, no sore throat and no vomiting   Constipation Associated symptoms: abdominal pain   Associated symptoms: no back pain, no dysuria, no fever and no vomiting        Home Medications Prior to Admission medications   Medication Sig Start Date End Date Taking? Authorizing Provider  Alcohol Swabs (ALCOHOL PREP) 70 % PADS Use to test blood sugar daily. Dx: E11.21 09/11/21   Wardell Honour, MD  apixaban (ELIQUIS) 5 MG TABS tablet Take 1 tablet (5 mg total) by mouth 2 (two) times daily. 03/30/22   Nahser, Wonda Cheng, MD  blood glucose meter kit and supplies Dispense based on patient and insurance preference. Use up to four times daily as directed. (FOR ICD-10 E10.9, E11.9). 07/14/21   Wardell Honour, MD  carvedilol (COREG) 25 MG tablet Take 1 tablet (25 mg total) by mouth 2 (two) times daily with a meal. Needs an appointment before anymore future refills. 03/06/22   Wardell Honour, MD  diltiazem (CARDIZEM CD) 180 MG 24 hr capsule TAKE 2 CAPSULES BY MOUTH  DAILY AT NOON 06/03/21   Nahser, Wonda Cheng, MD   empagliflozin (JARDIANCE) 10 MG TABS tablet Take 1 tablet (10 mg total) by mouth daily. 02/02/22   Wardell Honour, MD  furosemide (LASIX) 40 MG tablet Take 40 mg by mouth as needed. 09/22/18   [provider]  glucose blood (ONETOUCH ULTRA) test strip Use to test blood sugar daily. Dx: E11.21 07/08/21   Wardell Honour, MD  isosorbide mononitrate (IMDUR) 30 MG 24 hr tablet Take 1 tablet (30 mg total) by mouth daily. 10/17/21   Wardell Honour, MD  losartan (COZAAR) 25 MG tablet Take 1 tablet (25 mg total) by mouth daily. 12/18/20   Nahser, Wonda Cheng, MD  metFORMIN (GLUCOPHAGE) 850 MG tablet Take 1 tablet (850 mg total) by mouth daily with breakfast. 02/02/22   Wardell Honour, MD  Multiple Vitamins-Minerals (MENS ONE DAILY PO) Take 1 tablet by mouth daily.    [provider]  Jonetta Speak LANCETS [id number] MISC Use as directed to test blood sugar once daily 12/06/17   Ladell Pier, MD  pantoprazole (PROTONIX) 40 MG tablet Take 1 tablet (40 mg total) by mouth daily. 12/08/21   Wardell Honour, MD  pravastatin (PRAVACHOL) 40 MG tablet Take 1 tablet (40 mg total) by mouth daily. 02/02/22   Wardell Honour, MD  sitaGLIPtin (JANUVIA) 50 MG tablet Take 1 tablet (50 mg total) by mouth daily. 10/17/21   Wardell Honour, MD  spironolactone (ALDACTONE) 25 MG tablet TAKE 1 TABLET BY MOUTH DAILY 03/03/22   Wardell Honour, MD  sulfamethoxazole-trimethoprim (BACTRIM DS) 800-160 MG tablet Take 1 tablet by mouth 2 (two) times daily for 7 days. 04/09/22 04/16/22  Medina-Vargas, Monina C, NP  tamsulosin (FLOMAX) 0.4 MG CAPS capsule Take 0.4 mg by mouth daily.    [provider]  triamcinolone ointment (KENALOG) 0.5 % Apply 1 Application topically 2 (two) times daily for 14 days. 04/09/22 04/23/22  Medina-Vargas, Senaida Lange, NP      Allergies    Patient has no known allergies.    Review of Systems   Review of Systems  Constitutional:  Negative for chills, diaphoresis and fever.   HENT:  Negative for sore throat and trouble swallowing.   Eyes:  Negative for visual disturbance.  Respiratory:  Negative for cough and shortness of breath.   Cardiovascular:  Negative for chest pain and leg swelling.  Gastrointestinal:  Positive for abdominal distention, abdominal pain and constipation. Negative for blood in stool and vomiting.  Genitourinary:  Negative for dysuria, flank pain, scrotal swelling and testicular pain.  Musculoskeletal:  Negative for back pain and neck pain.  Skin:  Negative for rash.  Neurological:  Negative for headaches.  Hematological:  Does not bruise/bleed easily.  Psychiatric/Behavioral:  Negative for confusion.     Physical Exam Updated Vital Signs BP 111/84 (BP Location: Right Arm)   Pulse 81   Temp 98.7 F (37.1 C) (Oral)   Resp 18   SpO2 98%  Physical Exam Vitals and nursing note reviewed.  Constitutional:      Appearance: Normal appearance. He is well-developed.  HENT:     Head: Atraumatic.     Nose: Nose normal.     Mouth/Throat:     Mouth: Mucous membranes are moist.  Eyes:     General: No scleral icterus.    Conjunctiva/sclera: Conjunctivae normal.     Pupils: Pupils are equal, round, and reactive to light.  Neck:     Trachea: No tracheal deviation.  Cardiovascular:     Rate and Rhythm: Normal rate and regular rhythm.     Pulses: Normal pulses.     Heart sounds: Normal heart sounds. No murmur heard.    No friction rub. No gallop.  Pulmonary:     Effort: Pulmonary effort is normal. No accessory muscle usage or respiratory distress.     Breath sounds: Normal breath sounds.  Abdominal:     General: Bowel sounds are normal. There is distension.     Palpations: Abdomen is soft. There is no mass.     Tenderness: There is abdominal tenderness. There is no guarding or rebound.     Hernia: No hernia is present.     Comments: Mild-mod distension.   Genitourinary:    Comments: No cva tenderness. No rectal mass felt. No impaction  felt.  Musculoskeletal:        General: No swelling.     Cervical back: Normal range of motion and neck supple. No rigidity.  Skin:    General: Skin is warm and dry.     Findings: No rash.  Neurological:     Mental Status: He is alert.     Comments: Alert, speech clear. Motor/sens grossly intact bil.   Psychiatric:        Mood and Affect: Mood normal.     ED Results / Procedures / Treatments   Labs (all labs ordered are listed, but only abnormal results are  displayed) Results for orders placed or performed during the hospital encounter of 04/13/22  CBC  Result Value Ref Range   WBC 12.2 (H) 4.0 - 10.5 K/uL   RBC 4.72 4.22 - 5.81 MIL/uL   Hemoglobin 13.9 13.0 - 17.0 g/dL   HCT 42.5 39.0 - 52.0 %   MCV 90.0 80.0 - 100.0 fL   MCH 29.4 26.0 - 34.0 pg   MCHC 32.7 30.0 - 36.0 g/dL   RDW 15.2 11.5 - 15.5 %   Platelets 210 150 - 400 K/uL   nRBC 0.0 0.0 - 0.2 %  Comprehensive metabolic panel  Result Value Ref Range   Sodium 125 (L) 135 - 145 mmol/L   Potassium 4.7 3.5 - 5.1 mmol/L   Chloride 90 (L) 98 - 111 mmol/L   CO2 17 (L) 22 - 32 mmol/L   Glucose, Bld 177 (H) 70 - 99 mg/dL   BUN 111 (H) 8 - 23 mg/dL   Creatinine, Ser 9.81 (H) 0.61 - 1.24 mg/dL   Calcium 8.9 8.9 - 10.3 mg/dL   Total Protein 7.4 6.5 - 8.1 g/dL   Albumin 3.2 (L) 3.5 - 5.0 g/dL   AST 22 15 - 41 U/L   ALT 16 0 - 44 U/L   Alkaline Phosphatase 56 38 - 126 U/L   Total Bilirubin 0.5 0.3 - 1.2 mg/dL   GFR, Estimated 5 (L) >60 mL/min   Anion gap 18 (H) 5 - 15  Urinalysis, Routine w reflex microscopic -Urine, Clean Catch  Result Value Ref Range   Color, Urine YELLOW YELLOW   APPearance CLOUDY (A) CLEAR   Specific Gravity, Urine 1.012 1.005 - 1.030   pH 5.0 5.0 - 8.0   Glucose, UA >=500 (A) NEGATIVE mg/dL   Hgb urine dipstick LARGE (A) NEGATIVE   Bilirubin Urine NEGATIVE NEGATIVE   Ketones, ur NEGATIVE NEGATIVE mg/dL   Protein, ur 30 (A) NEGATIVE mg/dL   Nitrite NEGATIVE NEGATIVE   Leukocytes,Ua LARGE (A)  NEGATIVE   RBC / HPF >50 0 - 5 RBC/hpf   WBC, UA 0-5 0 - 5 WBC/hpf   Bacteria, UA RARE (A) NONE SEEN   Squamous Epithelial / HPF 0-5 0 - 5 /HPF  Lipase, blood  Result Value Ref Range   Lipase 29 11 - 51 U/L    EKG None  Radiology CT ABDOMEN PELVIS WO CONTRAST  Result Date: 04/13/2022 CLINICAL DATA:  Abdominal pain, acute, nonlocalized abd pain, distension, elevated creatinine. r/o acute abd process, r/o urinary obstructive process. EXAM: CT ABDOMEN AND PELVIS WITHOUT CONTRAST TECHNIQUE: Multidetector CT imaging of the abdomen and pelvis was performed following the standard protocol without IV contrast. RADIATION DOSE REDUCTION: This exam was performed according to the departmental dose-optimization program which includes automated exposure control, adjustment of the mA and/or kV according to patient size and/or use of iterative reconstruction technique. COMPARISON:  None Available. FINDINGS: Lower chest: Dependent atelectasis in the lung bases. Coronary artery calcifications. Small hiatal hernia. Hepatobiliary: No focal liver abnormality is seen. No gallstones, gallbladder wall thickening, or biliary dilatation. Pancreas: Unremarkable. No pancreatic ductal dilatation or surrounding inflammatory changes. Spleen: Normal in size without focal abnormality. Adrenals/Urinary Tract: Distended bladder with surrounding fat stranding, associated mild bilateral hydroureteronephrosis and moderate perinephric stranding. No obstructing stone. Associated prostatomegaly. Stomach/Bowel: No dilated loops of small bowel. Mild gaseous distention of the redundant sigmoid colon. No bowel wall thickening or surrounding inflammation. Vascular/Lymphatic: Aortic atherosclerosis. No enlarged abdominal or pelvic lymph nodes. Reproductive: Enlarged prostate. Other: No  abdominal wall hernia or abnormality. Trace fluid along the bilateral paracolic gutters. Musculoskeletal: Bilateral L5 spondylolysis with associated grade 1  anterolisthesis of L5 on S1. No suspicious bone lesions. IMPRESSION: 1. Distended bladder with surrounding fat stranding, associated mild bilateral hydroureteronephrosis and moderate perinephric stranding. No obstructing stone. Findings are concerning for cystitis with possible ascending urinary tract infection in the setting of chronic bladder outlet obstruction due to prostatomegaly. Correlate with urinalysis. 2. Bilateral L5 spondylolysis with associated grade 1 anterolisthesis of L5 on S1. 3. Aortic Atherosclerosis (ICD10-I70.0). Electronically Signed   By: Emmit Alexanders M.D.   On: 04/13/2022 09:58    Procedures Procedures    Medications Ordered in ED Medications  lactated ringers bolus 500 mL (0 mLs Intravenous Stopped 04/13/22 1000)  sodium chloride 0.9 % bolus 1,000 mL (1,000 mLs Intravenous New Bag/Given 04/13/22 1000)    ED Course/ Medical Decision Making/ A&P                             Medical Decision Making Problems Addressed: Abdominal pain, generalized: acute illness or injury with systemic symptoms that poses a threat to life or bodily functions AKI (acute kidney injury): acute illness or injury with systemic symptoms that poses a threat to life or bodily functions Obstructive uropathy: acute illness or injury with systemic symptoms that poses a threat to life or bodily functions Other constipation: acute illness or injury  Amount and/or Complexity of Data Reviewed External Data Reviewed: notes. Labs: ordered. Decision-making details documented in ED Course. Radiology: ordered and independent interpretation performed. Decision-making details documented in ED Course. Discussion of management or test interpretation with external provider(s): hospitalist  Risk Prescription drug management. Decision regarding hospitalization.   Iv ns. Continuous pulse ox and cardiac monitoring. Labs ordered/sent. Imaging ordered.   Differential diagnosis includes sbo, constipation,  ileus, acute abd process, etc . Dispo decision including potential need for admission considered - will get labs and imaging and reassess.   Reviewed nursing notes and prior charts for additional history. External reports reviewed.   Cardiac monitor: sinus rhythm, rate 78.  Labs reviewed/interpreted by me - cr v high, c/w aki. ?obstructive uropathy, bladder scan. Foley.  CT reviewed/interpreted by me - distended bladder.   Ivf. Foley - 1200 cc output.   Medicine consulted for admission.             Final Clinical Impression(s) / ED Diagnoses Final diagnoses:  None    Rx / DC Orders ED Discharge Orders     None         Lajean Saver, MD 04/13/22 1218

## 2022-04-13 NOTE — ED Notes (Signed)
This RN assumed care of patient and received off going transfer of care report from off going RN.  Pt is resting on gurney at this time, respirations are spontaneous, even, unlabored and symmetrical bilaterally. Pt skin tone is appropriate for ethnicity, dry and warm. Pt connected to CCM, pulse ox and BP.

## 2022-04-13 NOTE — ED Triage Notes (Signed)
Pt BIB from home with c/o abdominal pain x 3 days and decreased urine output since Thursday. Pt also hasn't had BM in 4 days. Seen by PCP on Thursday and started on bactrim for UTI.

## 2022-04-14 DIAGNOSIS — N400 Enlarged prostate without lower urinary tract symptoms: Secondary | ICD-10-CM | POA: Diagnosis present

## 2022-04-14 DIAGNOSIS — Z6830 Body mass index (BMI) 30.0-30.9, adult: Secondary | ICD-10-CM | POA: Diagnosis not present

## 2022-04-14 DIAGNOSIS — E7800 Pure hypercholesterolemia, unspecified: Secondary | ICD-10-CM | POA: Diagnosis not present

## 2022-04-14 DIAGNOSIS — E1122 Type 2 diabetes mellitus with diabetic chronic kidney disease: Secondary | ICD-10-CM | POA: Diagnosis present

## 2022-04-14 DIAGNOSIS — I130 Hypertensive heart and chronic kidney disease with heart failure and stage 1 through stage 4 chronic kidney disease, or unspecified chronic kidney disease: Secondary | ICD-10-CM | POA: Diagnosis present

## 2022-04-14 DIAGNOSIS — N179 Acute kidney failure, unspecified: Secondary | ICD-10-CM | POA: Diagnosis present

## 2022-04-14 DIAGNOSIS — Z7901 Long term (current) use of anticoagulants: Secondary | ICD-10-CM | POA: Diagnosis not present

## 2022-04-14 DIAGNOSIS — Z87891 Personal history of nicotine dependence: Secondary | ICD-10-CM | POA: Diagnosis not present

## 2022-04-14 DIAGNOSIS — K219 Gastro-esophageal reflux disease without esophagitis: Secondary | ICD-10-CM | POA: Diagnosis present

## 2022-04-14 DIAGNOSIS — N136 Pyonephrosis: Secondary | ICD-10-CM | POA: Diagnosis present

## 2022-04-14 DIAGNOSIS — R339 Retention of urine, unspecified: Secondary | ICD-10-CM | POA: Diagnosis not present

## 2022-04-14 DIAGNOSIS — I4821 Permanent atrial fibrillation: Secondary | ICD-10-CM | POA: Diagnosis not present

## 2022-04-14 DIAGNOSIS — E669 Obesity, unspecified: Secondary | ICD-10-CM | POA: Diagnosis present

## 2022-04-14 DIAGNOSIS — B954 Other streptococcus as the cause of diseases classified elsewhere: Secondary | ICD-10-CM | POA: Diagnosis present

## 2022-04-14 DIAGNOSIS — N390 Urinary tract infection, site not specified: Secondary | ICD-10-CM | POA: Diagnosis not present

## 2022-04-14 DIAGNOSIS — N320 Bladder-neck obstruction: Secondary | ICD-10-CM | POA: Diagnosis present

## 2022-04-14 DIAGNOSIS — N1832 Chronic kidney disease, stage 3b: Secondary | ICD-10-CM | POA: Diagnosis present

## 2022-04-14 DIAGNOSIS — I5032 Chronic diastolic (congestive) heart failure: Secondary | ICD-10-CM | POA: Diagnosis present

## 2022-04-14 DIAGNOSIS — E871 Hypo-osmolality and hyponatremia: Secondary | ICD-10-CM | POA: Diagnosis present

## 2022-04-14 DIAGNOSIS — I69351 Hemiplegia and hemiparesis following cerebral infarction affecting right dominant side: Secondary | ICD-10-CM | POA: Diagnosis not present

## 2022-04-14 DIAGNOSIS — N139 Obstructive and reflux uropathy, unspecified: Secondary | ICD-10-CM

## 2022-04-14 DIAGNOSIS — Z833 Family history of diabetes mellitus: Secondary | ICD-10-CM | POA: Diagnosis not present

## 2022-04-14 DIAGNOSIS — E872 Acidosis, unspecified: Secondary | ICD-10-CM | POA: Diagnosis present

## 2022-04-14 DIAGNOSIS — Z79899 Other long term (current) drug therapy: Secondary | ICD-10-CM | POA: Diagnosis not present

## 2022-04-14 DIAGNOSIS — Z7984 Long term (current) use of oral hypoglycemic drugs: Secondary | ICD-10-CM | POA: Diagnosis not present

## 2022-04-14 DIAGNOSIS — K5909 Other constipation: Secondary | ICD-10-CM | POA: Diagnosis present

## 2022-04-14 DIAGNOSIS — E785 Hyperlipidemia, unspecified: Secondary | ICD-10-CM | POA: Diagnosis present

## 2022-04-14 DIAGNOSIS — I2510 Atherosclerotic heart disease of native coronary artery without angina pectoris: Secondary | ICD-10-CM | POA: Diagnosis present

## 2022-04-14 LAB — BASIC METABOLIC PANEL
Anion gap: 14 (ref 5–15)
BUN: 78 mg/dL — ABNORMAL HIGH (ref 8–23)
CO2: 17 mmol/L — ABNORMAL LOW (ref 22–32)
Calcium: 9.2 mg/dL (ref 8.9–10.3)
Chloride: 100 mmol/L (ref 98–111)
Creatinine, Ser: 5.2 mg/dL — ABNORMAL HIGH (ref 0.61–1.24)
GFR, Estimated: 11 mL/min — ABNORMAL LOW (ref >60)
Glucose, Bld: 175 mg/dL — ABNORMAL HIGH (ref 70–99)
Potassium: 4.3 mmol/L (ref 3.5–5.1)
Sodium: 131 mmol/L — ABNORMAL LOW (ref 135–145)

## 2022-04-14 LAB — CBC
HCT: 40.5 % (ref 39.0–52.0)
Hemoglobin: 13.8 g/dL (ref 13.0–17.0)
MCH: 30.1 pg (ref 26.0–34.0)
MCHC: 34.1 g/dL (ref 30.0–36.0)
MCV: 88.2 fL (ref 80.0–100.0)
Platelets: 226 10*3/uL (ref 150–400)
RBC: 4.59 MIL/uL (ref 4.22–5.81)
RDW: 15.2 % (ref 11.5–15.5)
WBC: 11.9 10*3/uL — ABNORMAL HIGH (ref 4.0–10.5)
nRBC: 0 % (ref 0.0–0.2)

## 2022-04-14 LAB — GLUCOSE, CAPILLARY
Glucose-Capillary: 173 mg/dL — ABNORMAL HIGH (ref 70–99)
Glucose-Capillary: 274 mg/dL — ABNORMAL HIGH (ref 70–99)
Glucose-Capillary: 213 mg/dL — ABNORMAL HIGH (ref 70–99)

## 2022-04-14 LAB — MAGNESIUM: Magnesium: 2.3 mg/dL (ref 1.7–2.4)

## 2022-04-14 MED ORDER — LACTATED RINGERS IV SOLN: INTRAVENOUS | Status: DC

## 2022-04-14 NOTE — Evaluation (Signed)
Occupational Therapy Evaluation Patient Details Name: [name] MRN: [id number] DOB: [birth year] Today's Date: 04/14/2022   History of Present Illness [name] is a 67 y.o. male who presents with abdominal pain x 3 days and decreased urine output. Recent UTI treated by PCP. PMH: CHF, A-Fib, Arthritis, CAD, SKD, DM Type 2, GERD, CVA, HTN, HLD, ICH   Clinical Impression   [name] was evaluated s/p the above admission list. He is mod I with use of RW at baseline. Upon evaluation he was limited by generalized weakness, unsteady with shuffling gait, safety awareness and problem solving, decreased activity tolerance and residual deficits from 2018 CVA. Overall he required up to min A fro transfers and mobility with RW and cues for management and safety. Due to the deficits listed below he also requires up to mod A for LB ADLs and set up A for UB ADLs. Pt will benefit from continued acute OT services. Pt will benefit from continued OT in pt's natural environment at discharge.       Recommendations for follow up therapy are one component of a multi-disciplinary discharge planning process, led by the attending physician.  Recommendations may be updated based on patient status, additional functional criteria and insurance authorization.   Assistance Recommended at Discharge Intermittent Supervision/Assistance  Patient can return home with the following A little help with walking and/or transfers;A little help with bathing/dressing/bathroom;Assistance with cooking/housework;Assist for transportation;Help with stairs or ramp for entrance    Functional Status Assessment  Patient has had a recent decline in their functional status and demonstrates the ability to make significant improvements in function in a reasonable and predictable amount of time.  Equipment Recommendations  None recommended by OT       Precautions / Restrictions Precautions Precautions: Fall Precaution Comments:  foley Restrictions Weight Bearing Restrictions: No      Mobility Bed Mobility Overal bed mobility: Needs Assistance Bed Mobility: Supine to Sit     Supine to sit: Supervision          Transfers Overall transfer level: Needs assistance Equipment used: Rolling walker (2 wheels) Transfers: Sit to/from Stand Sit to Stand: Min assist                  Balance Overall balance assessment: Needs assistance Sitting-balance support: Feet supported Sitting balance-Leahy Scale: Good     Standing balance support: Bilateral upper extremity supported, During functional activity Standing balance-Leahy Scale: Poor                             ADL either performed or assessed with clinical judgement   ADL Overall ADL's : Needs assistance/impaired Eating/Feeding: Independent;Sitting   Grooming: Set up;Sitting   Upper Body Bathing: Set up;Sitting   Lower Body Bathing: Moderate assistance;Sit to/from stand   Upper Body Dressing : Set up;Sitting   Lower Body Dressing: Maximal assistance;Sit to/from stand   Toilet Transfer: Minimal assistance;Rolling walker (2 wheels)   Toileting- Clothing Manipulation and Hygiene: Moderate assistance;Sit to/from stand       Functional mobility during ADLs: Minimal assistance;Rolling walker (2 wheels) General ADL Comments: assist for RW management, balance, safety and LB ADLs     Vision Baseline Vision/History: 1 Wears glasses Vision Assessment?: No apparent visual deficits     Perception Perception Perception Tested?: No   Praxis Praxis Praxis tested?: Not tested    Pertinent Vitals/Pain Pain Assessment Pain Assessment: No/denies pain     Hand Dominance  Right (affected by CVA; uses L more often now)   Extremity/Trunk Assessment Upper Extremity Assessment Upper Extremity Assessment: Generalized weakness;RUE deficits/detail RUE Deficits / Details: slowed and deliberate coordination - residual from 2018 CVA RUE  Sensation: decreased light touch RUE Coordination: decreased fine motor;decreased gross motor   Lower Extremity Assessment Lower Extremity Assessment: Defer to PT evaluation   Cervical / Trunk Assessment Cervical / Trunk Assessment: Kyphotic   Communication Communication Communication: No difficulties   Cognition Arousal/Alertness: Awake/alert Behavior During Therapy: WFL for tasks assessed/performed Overall Cognitive Status: Within Functional Limits for tasks assessed                                 General Comments: WFL for basic tasks assessed     General Comments  VSS on RA            Home Living Family/patient expects to be discharged to:: Private residence Living Arrangements: Alone Available Help at Discharge: Friend(s);Available PRN/intermittently Type of Home: House Home Access: Ramped entrance     Home Layout: One level     Bathroom Shower/Tub: Occupational psychologist: Handicapped height Bathroom Accessibility: Yes How Accessible: Accessible via walker Home Equipment: [place] (2 wheels);Cane - single point;Shower seat;Grab bars - toilet;Grab bars - tub/shower;Wheelchair - manual          Prior Functioning/Environment Prior Level of Function : Independent/Modified Independent;History of Falls (last six months)             Mobility Comments: RW for mobility, uses WC some mornings for ADLs. reports 3 recent falls, all in 1 day ADLs Comments: mod I - does not drive. Cooks, cleans, manages meds, etc.                 OT Goals(Current goals can be found in the care plan section) Acute Rehab OT Goals Patient Stated Goal: to feel better OT Goal Formulation: With patient Time For Goal Achievement: 04/28/22 Potential to Achieve Goals: Good ADL Goals Pt Will Perform Grooming: with supervision;standing Pt Will Perform Lower Body Dressing: with supervision;sit to/from stand Pt Will Transfer to Toilet: with  supervision;ambulating Pt Will Perform Toileting - Clothing Manipulation and hygiene: with supervision;sitting/lateral leans Additional ADL Goal #1: Pt will indep recall at least 3 fall prevention strategies to apply in the home setting   End of Session Equipment Utilized During Treatment: Rolling walker (2 wheels) Nurse Communication: Mobility status  Activity Tolerance: Patient tolerated treatment well Patient left: in chair;with call bell/phone within reach;with chair alarm set  OT Visit Diagnosis: Unsteadiness on feet (R26.81);Other abnormalities of gait and mobility (R26.89);Muscle weakness (generalized) (M62.81);History of falling (Z91.81)                Time: [id number] OT Time Calculation (min): 28 min Charges:  OT General Charges $OT Visit: 1 Visit OT Evaluation $OT Eval Moderate Complexity: 1 Mod OT Treatments $Self Care/Home Management : 8-22 mins  Shade Flood, OTR/L Acute Rehabilitation Services Office [phone] Secure Chat Communication Preferred   Elliot Cousin 04/14/2022, 10:29 AM

## 2022-04-14 NOTE — Progress Notes (Signed)
CSW received transportation flag on SDOH screening for patient. Community Resources placed on AVS.  Gilmore Laroche, MSW, [facility]

## 2022-04-14 NOTE — Evaluation (Signed)
Physical Therapy Evaluation Patient Details Name: [name] MRN: [id number] DOB: [birth year] Today's Date: 04/14/2022  History of Present Illness  Patient is a 67 y.o. male who presents with abdominal pain x 3 days, constipation and decreased urine output. Admtted with uremia, bladder outlet obstruction and AKI. Recent UTI treated by PCP. PMH: CHF, A-Fib, Arthritis, CAD, DM Type 2, GERD, CVA, HTN, ICH.  Clinical Impression  Patient presents with generalized weakness, impaired balance, decreased activity tolerance and impaired mobility s/p above. Pt lives alone and reports being Mod I for ADLs and IADLs at baseline, uses RW for ambulation. Today, pt requires Min A for standing and short distance ambulation with use of RW for support. Reports his shuffling gait pattern is not his normal walking, "I need to build my confidence back up." Pt self limiting distance today. Recommend increasing activity and walking while in the hospital. Mobility tech consulted. Hoping pt will progress well enough to return home with HHPT services as he lives alone. Will follow acutely to maximize independence and mobility prior to return home.     Recommendations for follow up therapy are one component of a multi-disciplinary discharge planning process, led by the attending physician.  Recommendations may be updated based on patient status, additional functional criteria and insurance authorization.  Follow Up Recommendations       Assistance Recommended at Discharge Intermittent Supervision/Assistance  Patient can return home with the following  Assist for transportation;Assistance with cooking/housework;A little help with walking and/or transfers;A little help with bathing/dressing/bathroom    Equipment Recommendations None recommended by PT  Recommendations for Other Services       Functional Status Assessment Patient has had a recent decline in their functional status and demonstrates the ability to make  significant improvements in function in a reasonable and predictable amount of time.     Precautions / Restrictions Precautions Precautions: Fall Restrictions Weight Bearing Restrictions: No      Mobility  Bed Mobility Overal bed mobility: Needs Assistance Bed Mobility: Sit to Supine       Sit to supine: Supervision   General bed mobility comments: No assist needed to bring LEs into bed.    Transfers Overall transfer level: Needs assistance Equipment used: Rolling walker (2 wheels) Transfers: Sit to/from Stand Sit to Stand: Min assist           General transfer comment: Min A to power to standing with cues for hand placement/technique. Flexed posture.    Ambulation/Gait Ambulation/Gait assistance: Min guard Gait Distance (Feet): 18 Feet Assistive device: Rolling walker (2 wheels) Gait Pattern/deviations: Decreased step length - right, Decreased step length - left, Wide base of support, Shuffle, Step-to pattern, Decreased dorsiflexion - left, Decreased dorsiflexion - right Gait velocity: decreased Gait velocity interpretation: <1.8 ft/sec, indicate of risk for recurrent falls   General Gait Details: Slow, shuffling like gait with wide BoS, waddling and flexed posture. Reports he has a tray on his RW that comes out so he does not step into the walker at home to walk. Fatigues. Self limiting distance.  Stairs            Wheelchair Mobility    Modified Rankin (Stroke Patients Only)       Balance Overall balance assessment: Needs assistance Sitting-balance support: Feet supported, No upper extremity supported Sitting balance-Leahy Scale: Good     Standing balance support: During functional activity, Bilateral upper extremity supported, Reliant on assistive device for balance Standing balance-Leahy Scale: Poor Standing balance comment: Requires Ue  support ins tanding.                             Pertinent Vitals/Pain Pain Assessment Pain  Assessment: No/denies pain    Home Living Family/patient expects to be discharged to:: Private residence Living Arrangements: Alone Available Help at Discharge: Friend(s);Available PRN/intermittently Type of Home: House Home Access: Ramped entrance       Home Layout: One level Home Equipment: Conservation officer, nature (2 wheels);Cane - single point;Shower seat;Grab bars - toilet;Grab bars - tub/shower;Wheelchair - manual      Prior Function Prior Level of Function : Independent/Modified Independent;History of Falls (last six months)             Mobility Comments: RW for mobility, uses WC some mornings for ADLs. reports 3 recent falls, all in 1 day ADLs Comments: mod I - does not drive. Cooks, cleans, manages meds, etc.     Hand Dominance   Dominant Hand: Right    Extremity/Trunk Assessment   Upper Extremity Assessment Upper Extremity Assessment: Defer to OT evaluation    Lower Extremity Assessment Lower Extremity Assessment: RLE deficits/detail RLE Deficits / Details: Dysdiadochokinesia present. Hx of CVA in 2018 RLE Sensation: decreased light touch RLE Coordination: decreased fine motor;decreased gross motor    Cervical / Trunk Assessment Cervical / Trunk Assessment: Kyphotic  Communication   Communication: No difficulties  Cognition Arousal/Alertness: Awake/alert Behavior During Therapy: WFL for tasks assessed/performed Overall Cognitive Status: Within Functional Limits for tasks assessed                                 General Comments: WFL for basic tasks assessed        General Comments General comments (skin integrity, edema, etc.): VSS on RA. Friend arrived halfway through session.    Exercises     Assessment/Plan    PT Assessment Patient needs continued PT services  PT Problem List Decreased strength;Decreased mobility;Decreased balance;Decreased knowledge of use of DME;Decreased activity tolerance;Decreased coordination;Decreased safety  awareness       PT Treatment Interventions Therapeutic activities;Gait training;Therapeutic exercise;Patient/family education;Balance training;Functional mobility training;DME instruction    PT Goals (Current goals can be found in the Care Plan section)  Acute Rehab PT Goals Patient Stated Goal: to get stronger and walk better PT Goal Formulation: With patient Time For Goal Achievement: 04/28/22 Potential to Achieve Goals: Good    Frequency Min 3X/week     Co-evaluation               AM-PAC PT "6 Clicks" Mobility  Outcome Measure Help needed turning from your back to your side while in a flat bed without using bedrails?: None Help needed moving from lying on your back to sitting on the side of a flat bed without using bedrails?: None Help needed moving to and from a bed to a chair (including a wheelchair)?: A Little Help needed standing up from a chair using your arms (e.g., wheelchair or bedside chair)?: A Little Help needed to walk in hospital room?: A Little Help needed climbing 3-5 steps with a railing? : A Little 6 Click Score: 20    End of Session Equipment Utilized During Treatment: Gait belt Activity Tolerance: Patient tolerated treatment well;Patient limited by fatigue Patient left: in bed;with call bell/phone within reach;with bed alarm set;with family/visitor present Nurse Communication: Mobility status PT Visit Diagnosis: Muscle weakness (generalized) (M62.81);Difficulty in walking,  not elsewhere classified (R26.2)    Time: [id number] PT Time Calculation (min) (ACUTE ONLY): 19 min   Charges:   PT Evaluation $PT Eval Moderate Complexity: 1 Mod          Marisa Severin, PT, DPT Acute Rehabilitation Services Secure chat preferred Office [phone]     Marguarite Arbour A Brownie Nehme 04/14/2022, 1:16 PM

## 2022-04-14 NOTE — Discharge Instructions (Signed)
Transportation Resources  -[facility] for an application. No fee for people over the age of 71. P: [phone]. Address: [address], [place], [place] [address]  -[employer]-Age 68 and over. Limit of 1 ride per week for ambulatory participants. Limit 1 ride per month for non-ambulatory participants needing wheelchair transportation. Contact: [phone] ([place]), [phone] [facility]).  -[employer] [place]: Available for people with disabilities who are unable to use fixed-route bus service. Fee applies. Contact: [phone] (For application [id number]) [phone] (Customer service)/(336) [phone] (I-ride reservation line)/(336) [phone] (Access [place] reservation line)    Meal Resources: -Development worker, community of [employer]: May be a waitlist but has other resources. P. [phone]    -[employer] [place]: Providing meal resources to the community. P: [phone]

## 2022-04-14 NOTE — Progress Notes (Signed)
PROGRESS NOTE        PATIENT DETAILS Name: [name] Age: 67 y.o. Sex: male Date of Birth: [birth year] Admit Date: 04/13/2022 Admitting Physician Evalee Mutton Kristeen Mans, MD [id number], [name], MD  Brief Summary: Patient is a 67 y.o.  male with history of CVA, HLD, HTN, PAF, HFpEF, CKD stage IIIb who was thought to have UTI a few days prior to this hospitalization and started on Bactrim-he presented to the ED on 4/1 with abdominal pain-he was found to have AKI due to obstructive uropathy.  See below for further details.   Significant events: 4/1>> admit to [facility]  Significant studies: 4/1>> CT abdomen/pelvis: Distended bladder with surrounding fat stranding-mild bilateral hydronephrosis, moderate perinephric stranding.  Findings concerning for cystitis/ascending urinary tract infection-in the setting of chronic bladder outlet obstruction due to prostatomegaly.  Significant microbiology data: None  Procedures: None  Consults: None  Subjective: Lying comfortably in bed-denies any chest pain or shortness of breath.  Objective: Vitals: Blood pressure 101/84, pulse 90, temperature 98.1 F (36.7 C), temperature source Oral, resp. rate 19, weight 98.3 kg, SpO2 91 %.   Exam: Gen Exam:Alert awake-not in any distress HEENT:atraumatic, normocephalic Chest: B/L clear to auscultation anteriorly CVS:S1S2 regular Abdomen:soft non tender, non distended Extremities:no edema Neurology: Non focal Skin: no rash  Pertinent Labs/Radiology:    Latest Ref Rng & Units 04/14/2022    3:39 AM 04/13/2022    7:56 AM 12/12/2019    3:44 PM  CBC  WBC 4.0 - 10.5 K/uL 11.9  12.2  6.6   Hemoglobin 13.0 - 17.0 g/dL 13.8  13.9  15.0   Hematocrit 39.0 - 52.0 % 40.5  42.5  44.7   Platelets 150 - 400 K/uL 226  210  302     Lab Results  Component Value Date   NA 131 (L) 04/14/2022   K 4.3 04/14/2022   CL 100 04/14/2022   CO2 17 (L) 04/14/2022     Assessment/Plan: AKI on  CKD stage IIIb Obstructive uropathy due to presumed BPH Overall proving-with downtrending renal function Significant postoperative diuresis overnight>10L Start IVF Continue Flomax/finasteride Will plan to keep Foley catheter in place on discharge until seen by outpatient urologist in several weeks for outpatient voiding trial.  Complicated UTI/pyelonephritis Likely in the setting of BPH/stasis Continue Rocephin Urine culture was never sent-doubt any benefit at this point given patient was on Bactrim prior to this hospitalization-and now is already on Rocephin. Monitor clinically  Chronic HFpEF Euvolemic Continue Coreg/Imdur Lasix/Aldactone/losartan held due to AKI  PAF Sinus rhythm Cardizem/Coreg/Eliquis Telemetry monitoring  CVA-history of ICH Residual dysarthria-slight residual right-sided weakness But otherwise nonfocal PT/OT while hospitalized-at risk for deconditioning/debility  CAD No anginal symptoms Continue Coreg/Imdur/statin  GERD PPI  DM-2 (A1c 8.3 on 3/20) CBGs to: SSI Holding all oral hypoglycemics  Recent Labs    04/13/22 1625 04/13/22 2113 04/14/22 0808  GLUCAP 199* 221* 173*    Obesity: Estimated body mass index is 30.23 kg/m as calculated from the following:   Height as of 04/09/22: 5\' 11"  (1.803 m).   Weight as of this encounter: 98.3 kg.   Code status:   Code Status: Full Code   DVT Prophylaxis: apixaban (ELIQUIS) tablet 5 mg    Family Communication: None at bedside   Disposition Plan: Status is: Inpatient Remains inpatient appropriate because: Severity of illness   Planned  Discharge Destination:Home health   Diet: Diet Order             Diet renal with fluid restriction Fluid restriction: 2000 mL Fluid; Room service appropriate? Yes; Fluid consistency: Thin  Diet effective now                     Antimicrobial agents: Anti-infectives (From admission, onward)    Start     Dose/Rate Route Frequency Ordered Stop    04/13/22 1300  cefTRIAXone (ROCEPHIN) 1 g in sodium chloride 0.9 % 100 mL IVPB        1 g 200 mL/hr over 30 Minutes Intravenous Every 24 hours 04/13/22 1246          MEDICATIONS: Scheduled Meds:  apixaban  5 mg Oral BID   carvedilol  25 mg Oral BID WC   diltiazem  360 mg Oral Daily   finasteride  5 mg Oral Daily   insulin aspart  0-6 Units Subcutaneous TID WC   isosorbide mononitrate  30 mg Oral QHS   pantoprazole  40 mg Oral Q supper   pravastatin  40 mg Oral QHS   sodium chloride flush  3 mL Intravenous Q12H   tamsulosin  0.4 mg Oral QHS   Continuous Infusions:  cefTRIAXone (ROCEPHIN)  IV 1 g (04/13/22 1345)   lactated ringers 125 mL/hr at 04/14/22 0853   PRN Meds:.acetaminophen **OR** acetaminophen, polyethylene glycol   I have personally reviewed following labs and imaging studies  LABORATORY DATA: CBC: Recent Labs  Lab 04/13/22 0756 04/14/22 0339  WBC 12.2* 11.9*  HGB 13.9 13.8  HCT 42.5 40.5  MCV 90.0 88.2  PLT 210 [id number]    Basic Metabolic Panel: Recent Labs  Lab 04/13/22 0756 04/13/22 1522 04/13/22 1907 04/14/22 0339  NA 125* 132* 131* 131*  K 4.7 5.1 4.4 4.3  CL 90* 96* 96* 100  CO2 17* 19* 21* 17*  GLUCOSE 177* 209* 271* 175*  BUN 111* 98* 87* 78*  CREATININE 9.81* 7.69* 6.47* 5.20*  CALCIUM 8.9 9.6 9.2 9.2  MG  --  2.5*  --   --   PHOS  --  5.0*  --   --     GFR: Estimated Creatinine Clearance: 16.5 mL/min (A) (by C-G formula based on SCr of 5.2 mg/dL (H)).  Liver Function Tests: Recent Labs  Lab 04/13/22 0756  AST 22  ALT 16  ALKPHOS 56  BILITOT 0.5  PROT 7.4  ALBUMIN 3.2*   Recent Labs  Lab 04/13/22 0756  LIPASE 29   No results for input(s): "AMMONIA" in the last 168 hours.  Coagulation Profile: No results for input(s): "INR", "PROTIME" in the last 168 hours.  Cardiac Enzymes: No results for input(s): "CKTOTAL", "CKMB", "CKMBINDEX", "TROPONINI" in the last 168 hours.  BNP (last 3 results) No results for input(s):  "PROBNP" in the last 8760 hours.  Lipid Profile: No results for input(s): "CHOL", "HDL", "LDLCALC", "TRIG", "CHOLHDL", "LDLDIRECT" in the last 72 hours.  Thyroid Function Tests: No results for input(s): "TSH", "T4TOTAL", "FREET4", "T3FREE", "THYROIDAB" in the last 72 hours.  Anemia Panel: No results for input(s): "VITAMINB12", "FOLATE", "FERRITIN", "TIBC", "IRON", "RETICCTPCT" in the last 72 hours.  Urine analysis:    Component Value Date/Time   COLORURINE YELLOW 04/13/2022 1009   APPEARANCEUR CLOUDY (A) 04/13/2022 1009   LABSPEC 1.012 04/13/2022 1009   PHURINE 5.0 04/13/2022 1009   GLUCOSEU >=500 (A) 04/13/2022 1009   HGBUR LARGE (A) 04/13/2022 1009  BILIRUBINUR NEGATIVE 04/13/2022 1009   BILIRUBINUR negative 04/09/2022 1149   KETONESUR NEGATIVE 04/13/2022 1009   PROTEINUR 30 (A) 04/13/2022 1009   UROBILINOGEN 0.2 04/09/2022 1149   UROBILINOGEN 0.2 09/02/2012 1251   NITRITE NEGATIVE 04/13/2022 1009   LEUKOCYTESUR LARGE (A) 04/13/2022 1009    Sepsis Labs: Lactic Acid, Venous    Component Value Date/Time   LATICACIDVEN 1.3 12/01/2010 1036    MICROBIOLOGY: Recent Results (from the past 240 hour(s))  Urine Culture     Status: Abnormal   Collection Time: 04/09/22 11:37 AM   Specimen: Urine  Result Value Ref Range Status   MICRO NUMBER: [id number]  Final   SPECIMEN QUALITY: Adequate  Final   Sample Source URINE  Final   STATUS: FINAL  Final   ISOLATE 1: Streptococcus agalactiae (A)  Final    Comment: Greater than 100,000 CFU/mL of Group B Streptococcus isolated Beta-hemolytic streptococci are predictably susceptible to Penicillin and other beta-lactams. Susceptibility testing not routinely performed. Please contact the laboratory within 3 days if  susceptibility testing is desired.     RADIOLOGY STUDIES/RESULTS: CT ABDOMEN PELVIS WO CONTRAST  Result Date: 04/13/2022 CLINICAL DATA:  Abdominal pain, acute, nonlocalized abd pain, distension, elevated creatinine. r/o  acute abd process, r/o urinary obstructive process. EXAM: CT ABDOMEN AND PELVIS WITHOUT CONTRAST TECHNIQUE: Multidetector CT imaging of the abdomen and pelvis was performed following the standard protocol without IV contrast. RADIATION DOSE REDUCTION: This exam was performed according to the departmental dose-optimization program which includes automated exposure control, adjustment of the mA and/or kV according to patient size and/or use of iterative reconstruction technique. COMPARISON:  None Available. FINDINGS: Lower chest: Dependent atelectasis in the lung bases. Coronary artery calcifications. Small hiatal hernia. Hepatobiliary: No focal liver abnormality is seen. No gallstones, gallbladder wall thickening, or biliary dilatation. Pancreas: Unremarkable. No pancreatic ductal dilatation or surrounding inflammatory changes. Spleen: Normal in size without focal abnormality. Adrenals/Urinary Tract: Distended bladder with surrounding fat stranding, associated mild bilateral hydroureteronephrosis and moderate perinephric stranding. No obstructing stone. Associated prostatomegaly. Stomach/Bowel: No dilated loops of small bowel. Mild gaseous distention of the redundant sigmoid colon. No bowel wall thickening or surrounding inflammation. Vascular/Lymphatic: Aortic atherosclerosis. No enlarged abdominal or pelvic lymph nodes. Reproductive: Enlarged prostate. Other: No abdominal wall hernia or abnormality. Trace fluid along the bilateral paracolic gutters. Musculoskeletal: Bilateral L5 spondylolysis with associated grade 1 anterolisthesis of L5 on S1. No suspicious bone lesions. IMPRESSION: 1. Distended bladder with surrounding fat stranding, associated mild bilateral hydroureteronephrosis and moderate perinephric stranding. No obstructing stone. Findings are concerning for cystitis with possible ascending urinary tract infection in the setting of chronic bladder outlet obstruction due to prostatomegaly. Correlate with  urinalysis. 2. Bilateral L5 spondylolysis with associated grade 1 anterolisthesis of L5 on S1. 3. Aortic Atherosclerosis (ICD10-I70.0). Electronically Signed   By: Emmit Alexanders M.D.   On: 04/13/2022 09:58     LOS: 0 days   Oren Binet, MD  [facility]    To contact the attending provider between 7A-7P or the covering provider during after hours 7P-7A, please log into the web site [email or web address] and access using universal [place] password for that web site. If you do not have the password, please call the hospital operator.  04/14/2022, 9:14 AM

## 2022-04-15 ENCOUNTER — Other Ambulatory Visit: Payer: Self-pay

## 2022-04-15 DIAGNOSIS — I5032 Chronic diastolic (congestive) heart failure: Secondary | ICD-10-CM | POA: Diagnosis not present

## 2022-04-15 DIAGNOSIS — N390 Urinary tract infection, site not specified: Secondary | ICD-10-CM | POA: Diagnosis not present

## 2022-04-15 DIAGNOSIS — R339 Retention of urine, unspecified: Secondary | ICD-10-CM | POA: Diagnosis not present

## 2022-04-15 DIAGNOSIS — N179 Acute kidney failure, unspecified: Secondary | ICD-10-CM | POA: Diagnosis not present

## 2022-04-15 DIAGNOSIS — I4821 Permanent atrial fibrillation: Secondary | ICD-10-CM | POA: Diagnosis not present

## 2022-04-15 DIAGNOSIS — N139 Obstructive and reflux uropathy, unspecified: Secondary | ICD-10-CM | POA: Diagnosis not present

## 2022-04-15 LAB — GLUCOSE, CAPILLARY
Glucose-Capillary: 193 mg/dL — ABNORMAL HIGH (ref 70–99)
Glucose-Capillary: 160 mg/dL — ABNORMAL HIGH (ref 70–99)
Glucose-Capillary: 198 mg/dL — ABNORMAL HIGH (ref 70–99)
Glucose-Capillary: 267 mg/dL — ABNORMAL HIGH (ref 70–99)

## 2022-04-15 LAB — BASIC METABOLIC PANEL
Anion gap: 14 (ref 5–15)
BUN: 45 mg/dL — ABNORMAL HIGH (ref 8–23)
CO2: 21 mmol/L — ABNORMAL LOW (ref 22–32)
Calcium: 9.4 mg/dL (ref 8.9–10.3)
Chloride: 102 mmol/L (ref 98–111)
Creatinine, Ser: 2.37 mg/dL — ABNORMAL HIGH (ref 0.61–1.24)
GFR, Estimated: 29 mL/min — ABNORMAL LOW (ref >60)
Glucose, Bld: 172 mg/dL — ABNORMAL HIGH (ref 70–99)
Potassium: 3.7 mmol/L (ref 3.5–5.1)
Sodium: 137 mmol/L (ref 135–145)

## 2022-04-15 MED ORDER — ONETOUCH ULTRA VI STRP: ORAL_STRIP | 11 refills | Status: DC

## 2022-04-15 MED ORDER — ISOSORBIDE MONONITRATE ER 30 MG PO TB24: 30.0000 mg | ORAL_TABLET | Freq: Every day | ORAL | 3 refills | Status: DC

## 2022-04-15 MED ORDER — ONETOUCH ULTRA VI STRP: 1.0000 | ORAL_STRIP | Freq: Every day | 11 refills | Status: AC

## 2022-04-15 NOTE — Addendum Note (Signed)
Addended by: Logan Bores on: 04/15/2022 03:39 PM   Modules accepted: Orders

## 2022-04-15 NOTE — Consult Note (Addendum)
Urology Consult   Physician requesting consult: Hospital service  Reason for consult: urinary retention/UTI  History of Present Illness: [name] is a 67 y.o. male with multiple serious medical comorbidities.  Including CAD/CHF,.  Atrial fibrillation, history of CVA, hypertension.  On chronic anticoagulation. Patient presented to the emergency department 04/13/2022 with several day history of abdominal pain.  Of note is that the patient was started on Bactrim for suspected UTI 3 to 4 days previously prior to presenting to the ED.  Outpatient urine culture from 04/09/2022 demonstrated streptococcal species.  On further evaluation in the emergency department the patient was noted to be in urinary retention and had Foley catheter placed. CT stone study had demonstrated a distended bladder with surrounding fat stranding associated with mild bilateral hydroureteronephrosis and moderate perinephric stranding concerning for cystitis with possible ascending infection in the setting of apparent chronic bladder outlet obstruction.  Enlarged prostate also noted.  Laboratory studies were remarkable for significant acute kidney injury.  His creatinine on presentation was approximately 9 with baseline approximately 1.6.  Since his admission with Foley catheter drainage his creatinine has trended down towards baseline with most recent being 2.3.  He has a long hx of BPH/LUTS and elevated psa s/p prior neg bx (Dr. Lovena Neighbours) 10/2020 for psa=5.73(prostate volume=73ml).  Now on tamsulosin and finasteride.  Past Medical History:  Diagnosis Date   A-fib 11/19/2010   Acute exacerbation of congestive heart failure 11/19/2010   Arthritis    CAD (coronary artery disease) 11/20/2010   CHF (congestive heart failure)    CKD (chronic kidney disease) stage 3, GFR 30-59 ml/min 05/04/2017   Coronary artery disease    Diabetes type 2, controlled 11/19/2010   GERD (gastroesophageal reflux disease) 02/08/2015   Gout     Gout 11/20/2010   History of cerebrovascular accident (CVA) with residual deficit 05/04/2017   History of CVA (cerebrovascular accident) 11/19/2010   HTN (hypertension) 07/01/2012   Hyperlipemia 11/21/2010   Hypertension    Hypertensive emergency 11/19/2010   ICH (intracerebral hemorrhage) 12/22/2010   Physical deconditioning 12/22/2010   Pulmonary edema 11/19/2010   Respiratory failure 11/19/2010   Shortness of breath    Stroke 11/19/2010   Thyroiditis 11/20/2010    Past Surgical History:  Procedure Laterality Date   PEG PLACEMENT  12/03/2010   Procedure: PERCUTANEOUS ENDOSCOPIC GASTROSTOMY (PEG) PLACEMENT;  Surgeon: Lafayette Dragon, MD;  Location: [name] ENDOSCOPY;  Service: Endoscopy;  Laterality: N/A;   TRACHEOSTOMY TUBE PLACEMENT  11/28/2010   Procedure: TRACHEOSTOMY;  Surgeon: Beckie Salts, MD;  Location: [facility] OR;  Service: ENT;  Laterality: N/A;    Medications:  Home meds:    Scheduled Meds:  apixaban  5 mg Oral BID   carvedilol  25 mg Oral BID WC   diltiazem  360 mg Oral Daily   finasteride  5 mg Oral Daily   insulin aspart  0-6 Units Subcutaneous TID WC   isosorbide mononitrate  30 mg Oral QHS   pantoprazole  40 mg Oral Q supper   pravastatin  40 mg Oral QHS   sodium chloride flush  3 mL Intravenous Q12H   tamsulosin  0.4 mg Oral QHS   Continuous Infusions:  cefTRIAXone (ROCEPHIN)  IV 1 g (04/15/22 1240)   lactated ringers 75 mL/hr at 04/15/22 1236   PRN Meds:.acetaminophen **OR** acetaminophen, polyethylene glycol  Allergies: No Known Allergies  Family History  Problem Relation Age of Onset   Diabetes Mother    Diabetes Brother  Colon cancer Neg Hx    Colon polyps Neg Hx    Kidney disease Neg Hx    Esophageal cancer Neg Hx    Heart disease Neg Hx    Gallbladder disease Neg Hx     Social History:  reports that he quit smoking about 16 years ago. His smoking use included cigarettes. He has never used smokeless tobacco. He reports that he does not drink  alcohol and does not use drugs.  ROS: A complete review of systems was performed.  All systems are negative except for pertinent findings as noted.  Physical Exam:  Vital signs in last 24 hours: Temp:  [97.5 F (36.4 C)-98.5 F (36.9 C)] 97.5 F (36.4 C) (04/03 1147) Pulse Rate:  [77-92] 81 (04/03 1242) Resp:  [15-23] 17 (04/03 1242) BP: (103-124)/(76-91) 114/83 (04/03 1242) SpO2:  [93 %-97 %] 93 % (04/03 1242) Constitutional:  Alert and oriented, No acute distress  Genitourinary: No CVAT.  Foley catheter in place draining clear urine. Lymphatic: No lymphadenopathy Neurologic: Grossly intact, no focal deficits Psychiatric: Normal mood and affect  Laboratory Data:  Recent Labs    04/13/22 0756 04/14/22 0339  WBC 12.2* 11.9*  HGB 13.9 13.8  HCT 42.5 40.5  PLT 210 226    Recent Labs    04/13/22 0756 04/13/22 1522 04/13/22 1907 04/14/22 0339 04/15/22 0923  NA 125* 132* 131* 131* 137  K 4.7 5.1 4.4 4.3 3.7  CL 90* 96* 96* 100 102  GLUCOSE 177* 209* 271* 175* 172*  BUN 111* 98* 87* 78* 45*  CALCIUM 8.9 9.6 9.2 9.2 9.4  CREATININE 9.81* 7.69* 6.47* 5.20* 2.37*     Results for orders placed or performed during the hospital encounter of 04/13/22 (from the past 24 hour(s))  Glucose, capillary     Status: Abnormal   Collection Time: 04/14/22  3:56 PM  Result Value Ref Range   Glucose-Capillary 213 (H) 70 - 99 mg/dL  Glucose, capillary     Status: Abnormal   Collection Time: 04/15/22  8:18 AM  Result Value Ref Range   Glucose-Capillary 193 (H) 70 - 99 mg/dL   Comment 1 Notify RN    Comment 2 Call MD NNP PA CNM   Basic metabolic panel     Status: Abnormal   Collection Time: 04/15/22  9:23 AM  Result Value Ref Range   Sodium 137 135 - 145 mmol/L   Potassium 3.7 3.5 - 5.1 mmol/L   Chloride 102 98 - 111 mmol/L   CO2 21 (L) 22 - 32 mmol/L   Glucose, Bld 172 (H) 70 - 99 mg/dL   BUN 45 (H) 8 - 23 mg/dL   Creatinine, Ser 2.37 (H) 0.61 - 1.24 mg/dL   Calcium 9.4 8.9  - 10.3 mg/dL   GFR, Estimated 29 (L) >60 mL/min   Anion gap 14 5 - 15  Glucose, capillary     Status: Abnormal   Collection Time: 04/15/22 11:41 AM  Result Value Ref Range   Glucose-Capillary 160 (H) 70 - 99 mg/dL   Recent Results (from the past 240 hour(s))  Urine Culture     Status: Abnormal   Collection Time: 04/09/22 11:37 AM   Specimen: Urine  Result Value Ref Range Status   MICRO NUMBER: [id number]  Final   SPECIMEN QUALITY: Adequate  Final   Sample Source URINE  Final   STATUS: FINAL  Final   ISOLATE 1: Streptococcus agalactiae (A)  Final    Comment: Greater than 100,000  CFU/mL of Group B Streptococcus isolated Beta-hemolytic streptococci are predictably susceptible to Penicillin and other beta-lactams. Susceptibility testing not routinely performed. Please contact the laboratory within 3 days if  susceptibility testing is desired.     Renal Function: Recent Labs    04/13/22 0756 04/13/22 1522 04/13/22 1907 04/14/22 0339 04/15/22 0923  CREATININE 9.81* 7.69* 6.47* 5.20* 2.37*   Estimated Creatinine Clearance: 36.1 mL/min (A) (by C-G formula based on SCr of 2.37 mg/dL (H)).  Radiologic Imaging: No results found.  I independently reviewed the above imaging studies.  Impression/Recommendation Urinary retention in setting of UTI.  Despite like of culture guidance, clinical picture and imaging suggestive of ascending infection as well.  Would recommend leaving Foley until outpatient urologic FU for voiding trial. Would also complete empiric 10-14 day course of antibiotics. Continue combination med tx for BPH with tamsulosin and finasteride.   Pamala Hurry 04/15/2022, 2:32 PM

## 2022-04-15 NOTE — TOC Initial Note (Signed)
Transition of Care [facility]) - Initial/Assessment Note    Patient Details  Name: [name] MRN: [id number] Date of Birth: 1955/04/05  [facility] CM/SW Contact:    Levonne Lapping, RN Phone Number: 04/15/2022, 10:23 AM  Clinical Narrative:    CM met with Patient bedside to complete initial assessment .  Patient lives alone but stated there are a lot of neighbors and friends to support him. He uses a rolling walker at baseline.  No additional DME has been recommended.  Home Health PT and OT are recommended and [facility] (Choice/insurance) will provide services. Patient states a friend will transport him home at [place].   TOC will continue to follow patient for any additional discharge needs                   Expected Discharge Plan: [place] Barriers to Discharge: Continued Medical Work up   Patient Goals and CMS Choice Patient states their goals for this hospitalization and ongoing recovery are:: Go Home CMS Medicare.gov Compare Post Acute Care list provided to:: Patient Choice offered to / list presented to : Patient      Expected Discharge Plan and Services   Discharge Planning Services: CM Consult Post Acute Care Choice: Home Health                   DME Arranged: N/A         HH Arranged: RN, PT, OT [place] Agency: [place] Date [place]: 04/15/22 Time [place]: 62 Representative spoke with at [place]: [place] Arrangements/Services   Lives with:: Self Patient language and need for interpreter reviewed:: Yes Do you feel safe going back to the place where you live?: Yes      Need for Family Participation in Patient Care: No (Comment) Care giver support system in place?: Yes (comment) (Friends and Education administrator) Current home services:  (NONE) Criminal Activity/Legal Involvement Pertinent to Current Situation/Hospitalization: No - Comment as needed  Activities of Daily Living      Permission  Sought/Granted Permission sought to share information with : Case Manager Permission granted to share information with : Yes, Verbal Permission Granted     Permission granted to share info w AGENCY: HH Agency        Emotional Assessment Appearance:: Appears stated age Attitude/Demeanor/Rapport: Engaged Affect (typically observed): Hopeful, Pleasant Orientation: : Oriented to Self, Oriented to Place, Oriented to  Time, Oriented to Situation Alcohol / Substance Use: Not Applicable Psych Involvement: No (comment)  Admission diagnosis:  Other constipation [K59.09] Abdominal pain, generalized [R10.84] Obstructive uropathy [N13.9] AKI (acute kidney injury) [N17.9] Acute renal failure superimposed on stage 3b chronic kidney disease, unspecified acute renal failure type [N17.9, N18.32] Patient Active Problem List   Diagnosis Date Noted   AKI (acute kidney injury) 04/14/2022   Hyponatremia 04/13/2022   High anion gap metabolic acidosis [id number]   Uremia 04/13/2022   UTI (urinary tract infection) 04/13/2022   Bladder outlet obstruction 04/13/2022   Acute renal failure superimposed on stage 3b chronic kidney disease 04/13/2022   Diabetes due to underlying condition w diabetic nephropathy 03/25/2021   Chronic diastolic CHF (congestive heart failure) 10/28/2017   History of cerebrovascular accident (CVA) with residual deficit 05/04/2017   CKD (chronic kidney disease) stage 3, GFR 30-59 ml/min 05/04/2017   GERD (gastroesophageal reflux disease) 02/08/2015   HTN (hypertension) 07/01/2012   Hyperlipemia 11/21/2010   Gout 11/20/2010  CAD (coronary artery disease) 11/20/2010   Permanent atrial fibrillation 11/19/2010   PCP:  Wardell Honour, MD Pharmacy:   [facility] [address] [place] [address] Phone: [phone] Fax: [phone]  [facility] ([place]) [place], [place] [address] Phone: [phone] Fax: [place], [place] - [address] [place] [address] Phone: [phone] Fax: [phone]     Social Determinants of Health (SDOH) Social History: [place]   Transportation Needs: Unmet Transportation Needs (01/27/2022)  Depression (PHQ2-9): Low Risk  (04/09/2022)  Tobacco Use: Medium Risk (04/13/2022)   SDOH Interventions: Transportation Interventions: Inpatient TOC   Readmission Risk Interventions     No data to display

## 2022-04-15 NOTE — Progress Notes (Signed)
Physical Therapy Treatment Patient Details Name: [name] MRN: [id number] DOB: Sep 11, 1955 Today's Date: 04/15/2022   History of Present Illness Patient is a 67 y.o. male who presents with abdominal pain x 3 days, constipation and decreased urine output. Admtted with uremia, bladder outlet obstruction and AKI. Recent UTI treated by PCP. PMH: CHF, A-Fib, Arthritis, CAD, DM Type 2, GERD, CVA, HTN, ICH.    PT Comments    Pt greeted semi-reclined in bed and agreeable to session with steady progress towards acute goals. Pt completing bed mobility with light min A to elevate trunk and min A to transfer to stand with cues for hand placement. Pt continues to demonstrate shuffling gate this session, with min guard provided for safety and RW support, despite cues for larger step amplitude and increased foot clearance, however pt without overt LOB. Current plan remains appropriate to address deficits and maximize functional independence and safety. Pt continues to benefit from skilled PT services to progress toward functional mobility goals.    Recommendations for follow up therapy are one component of a multi-disciplinary discharge planning process, led by the attending physician.  Recommendations may be updated based on patient status, additional functional criteria and insurance authorization.  Follow Up Recommendations       Assistance Recommended at Discharge Intermittent Supervision/Assistance  Patient can return home with the following Assist for transportation;Assistance with cooking/housework;A little help with walking and/or transfers;A little help with bathing/dressing/bathroom   Equipment Recommendations  None recommended by PT    Recommendations for Other Services       Precautions / Restrictions Precautions Precautions: Fall Precaution Comments: foley Restrictions Weight Bearing Restrictions: No     Mobility  Bed Mobility Overal bed mobility: Needs Assistance Bed  Mobility: Supine to Sit     Supine to sit: Min assist     General bed mobility comments: min A to elevate trunk, pt reaching for this PTAs hand    Transfers Overall transfer level: Needs assistance Equipment used: Rolling walker (2 wheels) Transfers: Sit to/from Stand, Bed to chair/wheelchair/BSC Sit to Stand: Min assist   Step pivot transfers: Min guard       General transfer comment: Min A to power to standing with cues for hand placement/technique. short shuffling steps over to [facility]    Ambulation/Gait Ambulation/Gait assistance: Min guard Gait Distance (Feet): 42 Feet Assistive device: Rolling walker (2 wheels) Gait Pattern/deviations: Decreased step length - right, Decreased step length - left, Wide base of support, Shuffle, Step-to pattern, Decreased dorsiflexion - left, Decreased dorsiflexion - right Gait velocity: decreased     General Gait Details: Slow, shuffling like gait with wide BoS, waddling and flexed posture with arms/elbows extended straight. Reports he has a tray on his RW that comes out so he does not step into the walker at home to walk. HR up to 86bpm   Stairs             Wheelchair Mobility    Modified Rankin (Stroke Patients Only)       Balance Overall balance assessment: Needs assistance Sitting-balance support: Feet supported, No upper extremity supported Sitting balance-Leahy Scale: Good     Standing balance support: During functional activity, Bilateral upper extremity supported, Reliant on assistive device for balance Standing balance-Leahy Scale: Poor Standing balance comment: Requires Ue support ins tanding.                            Cognition Arousal/Alertness: Awake/alert Behavior  During Therapy: WFL for tasks assessed/performed Overall Cognitive Status: Within Functional Limits for tasks assessed                                 General Comments: WFL for basic tasks assessed        Exercises       General Comments General comments (skin integrity, edema, etc.): VSS on RA      Pertinent Vitals/Pain Pain Assessment Pain Assessment: Faces Faces Pain Scale: Hurts a little bit Pain Location: abdomen Pain Descriptors / Indicators: Tightness Pain Intervention(s): Monitored during session, Limited activity within patient's tolerance    Home Living                          Prior Function            PT Goals (current goals can now be found in the care plan section) Acute Rehab PT Goals Patient Stated Goal: to get stronger and walk better PT Goal Formulation: With patient Time For Goal Achievement: 04/28/22 Progress towards PT goals: Progressing toward goals    Frequency    Min 3X/week      PT Plan      Co-evaluation              AM-PAC PT "6 Clicks" Mobility   Outcome Measure  Help needed turning from your back to your side while in a flat bed without using bedrails?: None Help needed moving from lying on your back to sitting on the side of a flat bed without using bedrails?: None Help needed moving to and from a bed to a chair (including a wheelchair)?: A Little Help needed standing up from a chair using your arms (e.g., wheelchair or bedside chair)?: A Little Help needed to walk in hospital room?: A Little Help needed climbing 3-5 steps with a railing? : A Little 6 Click Score: 20    End of Session Equipment Utilized During Treatment: Gait belt Activity Tolerance: Patient tolerated treatment well;Patient limited by fatigue Patient left: with call bell/phone within reach;in chair;with chair alarm set Nurse Communication: Mobility status PT Visit Diagnosis: Muscle weakness (generalized) (M62.81);Difficulty in walking, not elsewhere classified (R26.2)     Time: [id number] PT Time Calculation (min) (ACUTE ONLY): 26 min  Charges:  $Gait Training: 8-22 mins $Therapeutic Activity: 8-22 mins                     Jeremaine Maraj R. PTA Acute  Rehabilitation Services Office: [place] 04/15/2022, 12:20 PM

## 2022-04-15 NOTE — Addendum Note (Signed)
Addended by: Logan Bores on: 04/15/2022 08:45 AM   Modules accepted: Orders

## 2022-04-15 NOTE — Progress Notes (Addendum)
PROGRESS NOTE        PATIENT DETAILS Name: [name] Age: 67 y.o. Sex: male Date of Birth: 1955/05/10 Admit Date: 04/13/2022 Admitting Physician Barry Mutton Kristeen Mans, [name] [id number], [name]  Brief Summary: Patient is a 67 y.o.  male with history of CVA, HLD, HTN, PAF, HFpEF, CKD stage IIIb who was thought to have UTI a few days prior to this hospitalization and started on Bactrim-he presented to the ED on 4/1 with abdominal pain-he was found to have AKI due to obstructive uropathy.  See below for further details.   Significant events: 4/1>> admit to [facility]  Significant studies: 4/1>> CT abdomen/pelvis: Distended bladder with surrounding fat stranding-mild bilateral hydronephrosis, moderate perinephric stranding.  Findings concerning for cystitis/ascending urinary tract infection-in the setting of chronic bladder outlet obstruction due to prostatomegaly.  Significant microbiology data: None  Procedures: None  Consults: None  Subjective: No major issues overnight-lying comfortably in bed.  Awaiting a.m. labs.  Objective: Vitals: Blood pressure 105/82, pulse 77, temperature (!) 97.5 F (36.4 C), temperature source Oral, resp. rate 20, weight 98.3 kg, SpO2 95 %.   Exam: Gen Exam:Alert awake-not in any distress HEENT:atraumatic, normocephalic Chest: B/L clear to auscultation anteriorly CVS:S1S2 regular Abdomen:soft non tender, non distended Extremities:no edema Neurology: Non focal Skin: no rash  Pertinent Labs/Radiology:    Latest Ref Rng & Units 04/14/2022    3:39 AM 04/13/2022    7:56 AM 12/12/2019    3:44 PM  CBC  WBC 4.0 - 10.5 K/uL 11.9  12.2  6.6   Hemoglobin 13.0 - 17.0 g/dL 13.8  13.9  15.0   Hematocrit 39.0 - 52.0 % 40.5  42.5  44.7   Platelets 150 - 400 K/uL 226  210  302     Lab Results  Component Value Date   NA 131 (L) 04/14/2022   K 4.3 04/14/2022   CL 100 04/14/2022   CO2 17 (L) 04/14/2022     Assessment/Plan: AKI  on CKD stage IIIb Obstructive uropathy due to presumed BPH Improving Await labs today Continue Flomax/finasteride Has indwelling Foley catheter in place-planned outpatient voiding trial Urology consulted 4/3  Complicated UTI/pyelonephritis Likely in the setting of BPH/stasis Continue Rocephin Urine culture 3/28 positive was Streptococcus Discussed with urologist-hopefully this is UTI/pyelonephritis and not prostatitis as this will change antibiotic duration.  Urology consulted 4/3 will await recommendations.  Chronic HFpEF Euvolemic Continue Coreg/Imdur Lasix/Aldactone/losartan held due to AKI  PAF Sinus rhythm Cardizem/Coreg/Eliquis Telemetry monitoring  CVA-history of ICH Residual dysarthria-slight residual right-sided weakness But otherwise nonfocal PT/OT eval-Home health recommended.  CAD No anginal symptoms Continue Coreg/Imdur/statin  GERD PPI  DM-2 (A1c 8.3 on 3/20) CBGs to be stable on SSI. Holding all oral hypoglycemics  Recent Labs    04/14/22 1204 04/14/22 1556 04/15/22 0818  GLUCAP 274* 213* 193*     Obesity: Estimated body mass index is 30.23 kg/m as calculated from the following:   Height as of 04/09/22: 5\' 11"  (1.803 m).   Weight as of this encounter: 98.3 kg.   Code status:   Code Status: Full Code   DVT Prophylaxis: apixaban (ELIQUIS) tablet 5 mg    Family Communication: None at bedside   Disposition Plan: Status is: Inpatient Remains inpatient appropriate because: Severity of illness   Planned Discharge Destination:Home health   Diet: Diet Order  Diet renal with fluid restriction Fluid restriction: 2000 mL Fluid; Room service appropriate? Yes; Fluid consistency: Thin  Diet effective now                     Antimicrobial agents: Anti-infectives (From admission, onward)    Start     Dose/Rate Route Frequency Ordered Stop   04/13/22 1300  cefTRIAXone (ROCEPHIN) 1 g in sodium chloride 0.9 % 100 mL IVPB         1 g 200 mL/hr over 30 Minutes Intravenous Every 24 hours 04/13/22 1246          MEDICATIONS: Scheduled Meds:  apixaban  5 mg Oral BID   carvedilol  25 mg Oral BID WC   diltiazem  360 mg Oral Daily   finasteride  5 mg Oral Daily   insulin aspart  0-6 Units Subcutaneous TID WC   isosorbide mononitrate  30 mg Oral QHS   pantoprazole  40 mg Oral Q supper   pravastatin  40 mg Oral QHS   sodium chloride flush  3 mL Intravenous Q12H   tamsulosin  0.4 mg Oral QHS   Continuous Infusions:  cefTRIAXone (ROCEPHIN)  IV 1 g (04/14/22 1238)   lactated ringers 125 mL/hr at 04/14/22 0853   PRN Meds:.acetaminophen **OR** acetaminophen, polyethylene glycol   I have personally reviewed following labs and imaging studies  LABORATORY DATA: CBC: Recent Labs  Lab 04/13/22 0756 04/14/22 0339  WBC 12.2* 11.9*  HGB 13.9 13.8  HCT 42.5 40.5  MCV 90.0 88.2  PLT 210 226     Basic Metabolic Panel: Recent Labs  Lab 04/13/22 0756 04/13/22 1522 04/13/22 1907 04/14/22 0339  NA 125* 132* 131* 131*  K 4.7 5.1 4.4 4.3  CL 90* 96* 96* 100  CO2 17* 19* 21* 17*  GLUCOSE 177* 209* 271* 175*  BUN 111* 98* 87* 78*  CREATININE 9.81* 7.69* 6.47* 5.20*  CALCIUM 8.9 9.6 9.2 9.2  MG  --  2.5*  --  2.3  PHOS  --  5.0*  --   --      GFR: Estimated Creatinine Clearance: 16.5 mL/min (A) (by C-G formula based on SCr of 5.2 mg/dL (H)).  Liver Function Tests: Recent Labs  Lab 04/13/22 0756  AST 22  ALT 16  ALKPHOS 56  BILITOT 0.5  PROT 7.4  ALBUMIN 3.2*    Recent Labs  Lab 04/13/22 0756  LIPASE 29    No results for input(s): "AMMONIA" in the last 168 hours.  Coagulation Profile: No results for input(s): "INR", "PROTIME" in the last 168 hours.  Cardiac Enzymes: No results for input(s): "CKTOTAL", "CKMB", "CKMBINDEX", "TROPONINI" in the last 168 hours.  BNP (last 3 results) No results for input(s): "PROBNP" in the last 8760 hours.  Lipid Profile: No results for input(s):  "CHOL", "HDL", "LDLCALC", "TRIG", "CHOLHDL", "LDLDIRECT" in the last 72 hours.  Thyroid Function Tests: No results for input(s): "TSH", "T4TOTAL", "FREET4", "T3FREE", "THYROIDAB" in the last 72 hours.  Anemia Panel: No results for input(s): "VITAMINB12", "FOLATE", "FERRITIN", "TIBC", "IRON", "RETICCTPCT" in the last 72 hours.  Urine analysis:    Component Value Date/Time   COLORURINE YELLOW 04/13/2022 1009   APPEARANCEUR CLOUDY (A) 04/13/2022 1009   LABSPEC 1.012 04/13/2022 1009   PHURINE 5.0 04/13/2022 1009   GLUCOSEU >=500 (A) 04/13/2022 1009   HGBUR LARGE (A) 04/13/2022 1009   BILIRUBINUR NEGATIVE 04/13/2022 1009   BILIRUBINUR negative 04/09/2022 [place] 04/13/2022 1009  PROTEINUR 30 (A) 04/13/2022 1009   UROBILINOGEN 0.2 04/09/2022 1149   UROBILINOGEN 0.2 09/02/2012 1251   NITRITE NEGATIVE 04/13/2022 1009   LEUKOCYTESUR LARGE (A) 04/13/2022 1009    Sepsis Labs: Lactic Acid, Venous    Component Value Date/Time   LATICACIDVEN 1.3 12/01/2010 1036    MICROBIOLOGY: Recent Results (from the past 240 hour(s))  Urine Culture     Status: Abnormal   Collection Time: 04/09/22 11:37 AM   Specimen: Urine  Result Value Ref Range Status   MICRO NUMBER: [id number]  Final   SPECIMEN QUALITY: Adequate  Final   Sample Source URINE  Final   STATUS: FINAL  Final   ISOLATE 1: Streptococcus agalactiae (A)  Final    Comment: Greater than 100,000 CFU/mL of Group B Streptococcus isolated Beta-hemolytic streptococci are predictably susceptible to Penicillin and other beta-lactams. Susceptibility testing not routinely performed. Please contact the laboratory within 3 days if  susceptibility testing is desired.     RADIOLOGY STUDIES/RESULTS: No results found.   LOS: 1 day   Oren Binet, [name]  [facility]    To contact the attending provider between 7A-7P or the covering provider during after hours 7P-7A, please log into the web site [email or web address] and  access using universal [place] password for that web site. If you do not have the password, please call the hospital operator.  04/15/2022, 12:05 PM

## 2022-04-16 ENCOUNTER — Other Ambulatory Visit (HOSPITAL_COMMUNITY): Payer: Self-pay

## 2022-04-16 DIAGNOSIS — E7800 Pure hypercholesterolemia, unspecified: Secondary | ICD-10-CM | POA: Diagnosis not present

## 2022-04-16 DIAGNOSIS — I5032 Chronic diastolic (congestive) heart failure: Secondary | ICD-10-CM | POA: Diagnosis not present

## 2022-04-16 DIAGNOSIS — N390 Urinary tract infection, site not specified: Secondary | ICD-10-CM | POA: Diagnosis not present

## 2022-04-16 DIAGNOSIS — N179 Acute kidney failure, unspecified: Secondary | ICD-10-CM | POA: Diagnosis not present

## 2022-04-16 LAB — BASIC METABOLIC PANEL
Anion gap: 10 (ref 5–15)
BUN: 33 mg/dL — ABNORMAL HIGH (ref 8–23)
CO2: 21 mmol/L — ABNORMAL LOW (ref 22–32)
Calcium: 9.1 mg/dL (ref 8.9–10.3)
Chloride: 108 mmol/L (ref 98–111)
Creatinine, Ser: 1.62 mg/dL — ABNORMAL HIGH (ref 0.61–1.24)
GFR, Estimated: 46 mL/min — ABNORMAL LOW (ref >60)
Glucose, Bld: 176 mg/dL — ABNORMAL HIGH (ref 70–99)
Potassium: 3.8 mmol/L (ref 3.5–5.1)
Sodium: 139 mmol/L (ref 135–145)

## 2022-04-16 LAB — GLUCOSE, CAPILLARY
Glucose-Capillary: 172 mg/dL — ABNORMAL HIGH (ref 70–99)
Glucose-Capillary: 172 mg/dL — ABNORMAL HIGH (ref 70–99)

## 2022-04-16 MED ORDER — FINASTERIDE 5 MG PO TABS
5.0000 mg | ORAL_TABLET | Freq: Every day | ORAL | 1 refills | Status: DC
  Filled 2022-04-16: qty 30, 30d supply, fill #0

## 2022-04-16 MED ORDER — CEPHALEXIN 500 MG PO CAPS
500.0000 mg | ORAL_CAPSULE | Freq: Two times a day (BID) | ORAL | 0 refills | Status: AC
  Filled 2022-04-16: qty 20, 10d supply, fill #0

## 2022-04-16 NOTE — TOC Progression Note (Signed)
Transition of Care [facility]) - Progression Note    Patient Details  Name: WADELL KROMER MRN: [id number] Date of Birth: 1955-04-06  Transition of Care [facility]) CM/SW [place], LCSW Phone Number: 04/16/2022, 9:39 AM  Clinical Narrative:    Meal resources placed on AVS for patient.    Expected Discharge Plan: [place] Barriers to Discharge: Continued Medical Work up  Expected Discharge Plan and Services   Discharge Planning Services: CM Consult Post Acute Care Choice: Home Health                   DME Arranged: N/A         HH Arranged: RN, PT, OT HH Agency: [place] Date [place]: 04/15/22 Time [place]: 1022 Representative spoke with at [place]: [place] ([place]) Interventions SDOH Screenings   Transportation Needs: Unmet Transportation Needs (01/27/2022)  Depression (PHQ2-9): Low Risk  (04/09/2022)  Tobacco Use: Medium Risk (04/13/2022)    Readmission Risk Interventions     No data to display

## 2022-04-16 NOTE — Progress Notes (Signed)
Physical Therapy Treatment Patient Details Name: [name] MRN: [id number] DOB: Feb 26, 1955 Today's Date: 04/16/2022   History of Present Illness Patient is a 67 y.o. male who presents with abdominal pain x 3 days, constipation and decreased urine output. Admtted with uremia, bladder outlet obstruction and AKI. Recent UTI treated by PCP. PMH: CHF, A-Fib, Arthritis, CAD, DM Type 2, GERD, CVA, HTN, ICH.    PT Comments    Pt with continued progress towards acute goals with pt demonstrating increased activity tolerance, progressing gait distance this session with min guard assist and RW support. Pt endorsing gait pattern is closer to baseline and pt without noted LOB throughout session. Continued to educate pt on benefits and importance of continued mobility with pt verbalizing understanding. Pt continues to benefit from skilled PT services to progress toward functional mobility goals.    Recommendations for follow up therapy are one component of a multi-disciplinary discharge planning process, led by the attending physician.  Recommendations may be updated based on patient status, additional functional criteria and insurance authorization.  Follow Up Recommendations       Assistance Recommended at Discharge Intermittent Supervision/Assistance  Patient can return home with the following Assist for transportation;Assistance with cooking/housework;A little help with walking and/or transfers;A little help with bathing/dressing/bathroom   Equipment Recommendations  None recommended by PT    Recommendations for Other Services       Precautions / Restrictions Precautions Precautions: Fall Precaution Comments: foley Restrictions Weight Bearing Restrictions: No     Mobility  Bed Mobility Overal bed mobility: Needs Assistance Bed Mobility: Supine to Sit     Supine to sit: Min guard     General bed mobility comments: use of bedrails but not physical assist needed     Transfers Overall transfer level: Needs assistance Equipment used: Rolling walker (2 wheels) Transfers: Sit to/from Stand, Bed to chair/wheelchair/BSC Sit to Stand: Min guard           General transfer comment: min guard for safety, VCs for hand placement before coming to stand    Ambulation/Gait Ambulation/Gait assistance: Min guard Gait Distance (Feet): 75 Feet Assistive device: Rolling walker (2 wheels) Gait Pattern/deviations: Decreased step length - right, Decreased step length - left, Wide base of support, Shuffle, Step-to pattern, Decreased dorsiflexion - left, Decreased dorsiflexion - right Gait velocity: decreased     General Gait Details: continued low, shuffling like gait with wide BoS, waddling, with arms/elbows extended straight. improved upright posture. cues for safety during turns, no overt LOB   Stairs             Wheelchair Mobility    Modified Rankin (Stroke Patients Only)       Balance Overall balance assessment: Needs assistance Sitting-balance support: Feet supported, No upper extremity supported Sitting balance-Leahy Scale: Good     Standing balance support: During functional activity, Bilateral upper extremity supported, Reliant on assistive device for balance Standing balance-Leahy Scale: Poor Standing balance comment: reliant on UE support during static and dynamic tasks                            Cognition Arousal/Alertness: Awake/alert Behavior During Therapy: WFL for tasks assessed/performed Overall Cognitive Status: Within Functional Limits for tasks assessed                                 General Comments: WFL for basic tasks  assessed        Exercises      General Comments General comments (skin integrity, edema, etc.): VSS on RA      Pertinent Vitals/Pain Pain Assessment Pain Assessment: No/denies pain Pain Intervention(s): Monitored during session    Home Living                           Prior Function            PT Goals (current goals can now be found in the care plan section) Acute Rehab PT Goals PT Goal Formulation: With patient Time For Goal Achievement: 04/28/22 Progress towards PT goals: Progressing toward goals    Frequency    Min 3X/week      PT Plan      Co-evaluation              AM-PAC PT "6 Clicks" Mobility   Outcome Measure  Help needed turning from your back to your side while in a flat bed without using bedrails?: None Help needed moving from lying on your back to sitting on the side of a flat bed without using bedrails?: None Help needed moving to and from a bed to a chair (including a wheelchair)?: A Little Help needed standing up from a chair using your arms (e.g., wheelchair or bedside chair)?: A Little Help needed to walk in hospital room?: A Little Help needed climbing 3-5 steps with a railing? : A Little 6 Click Score: 20    End of Session Equipment Utilized During Treatment: Gait belt Activity Tolerance: Patient tolerated treatment well;Patient limited by fatigue Patient left: with call bell/phone within reach;in chair;with chair alarm set Nurse Communication: Mobility status PT Visit Diagnosis: Muscle weakness (generalized) (M62.81);Difficulty in walking, not elsewhere classified (R26.2)     Time: [id number] PT Time Calculation (min) (ACUTE ONLY): 16 min  Charges:  $Gait Training: 8-22 mins                    Avrohom Mckelvin R. PTA Acute Rehabilitation Services Office: [place] 04/16/2022, 11:15 AM

## 2022-04-16 NOTE — TOC Transition Note (Addendum)
Transition of Care [facility]) - CM/SW Discharge Note   Patient Details  Name: [name] MRN: [id number] Date of Birth: Jul 28, 1955  Transition of Care [facility]) CM/SW Contact:  Levonne Lapping, RN Phone Number: 04/16/2022, 11:11 AM   Clinical Narrative:     Patient to DC to home today.   Patient has been recommended HH PT/OT/SN and AIDE.  [place] will provide services (Choice/insurance).  No DME is recommended. Patient states he owns a rolling walker, tub transfer bench, wheel chair/power chair  He is not interested in a BSC.  AVS has been updated  Patient's friend will transport home. Enlarged copy of [employer] card given to patient so he can call tyo increase his PCS services thru [employer]  No additional TOC needs.       Barriers to Discharge: Continued Medical Work up   Patient Goals and CMS Choice CMS Medicare.gov Compare Post Acute Care list provided to:: Patient Choice offered to / list presented to : Patient  Discharge Placement                         Discharge Plan and Services Additional resources added to the After Visit Summary for     Discharge Planning Services: CM Consult Post Acute Care Choice: Home Health          DME Arranged: N/A         HH Arranged: RN, PT, OT HH Agency: [place] Date [place]: 04/15/22 Time [place]: 1022 Representative spoke with at [place]: [place] ([place]) Interventions SDOH Screenings   Transportation Needs: Unmet Transportation Needs (01/27/2022)  Depression (PHQ2-9): Low Risk  (04/09/2022)  Tobacco Use: Medium Risk (04/13/2022)     Readmission Risk Interventions     No data to display

## 2022-04-16 NOTE — Discharge Summary (Signed)
PATIENT DETAILS Name: [name] Age: 67 y.o. Sex: male Date of Birth: December 17, 1955 MRN: [id number]. Admitting Physician: Jonetta Osgood, MD [id number], [name], MD  Admit Date: 04/13/2022 Discharge date: 04/16/2022  Recommendations for Outpatient Follow-up:  Follow up with PCP in 1-2 weeks Please obtain CMP/CBC in one week Please ensure follow-up with urology-needs a voiding trial as outpatient.  Admitted From:  Home  Disposition: Home health   Discharge Condition: fair  CODE STATUS:   Code Status: Full Code   Diet recommendation:  Diet Order             Diet - low sodium heart healthy           Diet Carb Modified           Diet renal with fluid restriction Fluid restriction: 2000 mL Fluid; Room service appropriate? Yes; Fluid consistency: Thin  Diet effective now                    Brief Summary: Patient is a 67 y.o.  male with history of CVA, HLD, HTN, PAF, HFpEF, CKD stage IIIb who was thought to have UTI a few days prior to this hospitalization and started on Bactrim-he presented to the ED on 4/1 with abdominal pain-he was found to have AKI due to obstructive uropathy.  See below for further details.    Significant events: 4/1>> admit to [facility]   Significant studies: 4/1>> CT abdomen/pelvis: Distended bladder with surrounding fat stranding-mild bilateral hydronephrosis, moderate perinephric stranding.  Findings concerning for cystitis/ascending urinary tract infection-in the setting of chronic bladder outlet obstruction due to prostatomegaly.   Significant microbiology data: None   Procedures: None   Consults: None  Brief Hospital Course: AKI on CKD stage IIIb Obstructive uropathy due to presumed BPH Improving with placement of Foley catheter Flomax/finasteride in place Appreciate urology input Plan is to discharge home with Foley catheter-he will need to follow-up with his primary urologist-Dr. Gilford Rile for outpatient voiding trial.    Complicated UTI/pyelonephritis Likely in the setting of BPH/stasis Urine culture 3/28-positive for Streptococcus agalactiae Managed with IV Rocephin-will be transition to Keflex on discharge Urology recommending at least 10-14 days of antibiotic coverage.   Chronic HFpEF Euvolemic Continue Coreg/Imdur Resume Aldactone/Farxiga/low-dose losartan on discharge   PAF Sinus rhythm Cardizem/Coreg/Eliquis   CVA-history of ICH Residual dysarthria-slight residual right-sided weakness But otherwise nonfocal PT/OT eval-Home health recommended.   CAD No anginal symptoms Continue Coreg/Imdur/statin   GERD PPI   DM-2 (A1c 8.3 on 3/20) CBGs to be stable on SSI. Resume all oral hypoglycemics on discharge  Obesity: Estimated body mass index is 30.23 kg/m as calculated from the following:   Height as of 04/09/22: 5\' 11"  (1.803 m).   Weight as of this encounter: 98.3 kg  Discharge Diagnoses:  Principal Problem:   Acute renal failure superimposed on stage 3b chronic kidney disease Active Problems:   Hyperlipemia   Permanent atrial fibrillation   CAD (coronary artery disease)   HTN (hypertension)   GERD (gastroesophageal reflux disease)   History of cerebrovascular accident (CVA) with residual deficit   CKD (chronic kidney disease) stage 3, GFR 30-59 ml/min   Chronic diastolic CHF (congestive heart failure)   Diabetes due to underlying condition w diabetic nephropathy   UTI (urinary tract infection)   Bladder outlet obstruction   AKI (acute kidney injury)   Discharge Instructions:  Activity:  As tolerated with Full fall precautions use walker/cane & assistance as needed   Discharge  Instructions     (HEART FAILURE PATIENTS) Call MD:  Anytime you have any of the following symptoms: 1) 3 pound weight gain in 24 hours or 5 pounds in 1 week 2) shortness of breath, with or without a dry hacking cough 3) swelling in the hands, feet or stomach 4) if you have to sleep on extra  pillows at night in order to breathe.   Complete by: As directed    Call MD for:  difficulty breathing, headache or visual disturbances   Complete by: As directed    Diet - low sodium heart healthy   Complete by: As directed    Diet Carb Modified   Complete by: As directed    Discharge instructions   Complete by: As directed    Follow with Primary MD  Wardell Honour, MD in 1-2 weeks  Follow-up with [facility]-Dr. Gilford Rile in 1-2 weeks from a voiding trial.  You will be discharged from the hospital with Foley catheter in place.  Please get a complete blood count and chemistry panel checked by your Primary MD at your next visit, and again as instructed by your Primary MD.  Get Medicines reviewed and adjusted: Please take all your medications with you for your next visit with your Primary MD  Laboratory/radiological data: Please request your Primary MD to go over all hospital tests and procedure/radiological results at the follow up, please ask your Primary MD to get all Hospital records sent to his/her office.  In some cases, they will be blood work, cultures and biopsy results pending at the time of your discharge. Please request that your primary care M.D. follows up on these results.  Also Note the following: If you experience worsening of your admission symptoms, develop shortness of breath, life threatening emergency, suicidal or homicidal thoughts you must seek medical attention immediately by calling 911 or calling your MD immediately  if symptoms less severe.  You must read complete instructions/literature along with all the possible adverse reactions/side effects for all the Medicines you take and that have been prescribed to you. Take any new Medicines after you have completely understood and accpet all the possible adverse reactions/side effects.   Do not drive when taking Pain medications or sleeping medications (Benzodaizepines)  Do not take more than prescribed  Pain, Sleep and Anxiety Medications. It is not advisable to combine anxiety,sleep and pain medications without talking with your primary care practitioner  Special Instructions: If you have smoked or chewed Tobacco  in the last 2 yrs please stop smoking, stop any regular Alcohol  and or any Recreational drug use.  Wear Seat belts while driving.  Please note: You were cared for by a hospitalist during your hospital stay. Once you are discharged, your primary care physician will handle any further medical issues. Please note that NO REFILLS for any discharge medications will be authorized once you are discharged, as it is imperative that you return to your primary care physician (or establish a relationship with a primary care physician if you do not have one) for your post hospital discharge needs so that they can reassess your need for medications and monitor your lab values.   Increase activity slowly   Complete by: As directed       Allergies as of 04/16/2022   No Known Allergies      Medication List     STOP taking these medications    sulfamethoxazole-trimethoprim 800-160 MG tablet Commonly known as: BACTRIM DS  TAKE these medications    acetaminophen 500 MG tablet Commonly known as: TYLENOL Take 1,000 mg by mouth daily as needed for moderate pain, fever or headache.   Alcohol Prep 70 % Pads Use to test blood sugar daily. Dx: E11.21   apixaban 5 MG Tabs tablet Commonly known as: Eliquis Take 1 tablet (5 mg total) by mouth 2 (two) times daily.   blood glucose meter kit and supplies Dispense based on patient and insurance preference. Use up to four times daily as directed. (FOR ICD-10 E10.9, E11.9).   carvedilol 25 MG tablet Commonly known as: COREG Take 1 tablet (25 mg total) by mouth 2 (two) times daily with a meal. Needs an appointment before anymore future refills.   cephALEXin 500 MG capsule Commonly known as: Keflex Take 1 capsule (500 mg total) by mouth  2 (two) times daily for 10 days.   diltiazem 180 MG 24 hr capsule Commonly known as: CARDIZEM CD TAKE 2 CAPSULES BY MOUTH  DAILY AT NOON What changed:  how much to take how to take this when to take this additional instructions   empagliflozin 10 MG Tabs tablet Commonly known as: JARDIANCE Take 1 tablet (10 mg total) by mouth daily.   finasteride 5 MG tablet Commonly known as: PROSCAR Take 1 tablet (5 mg total) by mouth daily. Start taking on: April 17, 2022   isosorbide mononitrate 30 MG 24 hr tablet Commonly known as: IMDUR Take 1 tablet (30 mg total) by mouth daily. What changed: when to take this   losartan 25 MG tablet Commonly known as: COZAAR Take 1 tablet (25 mg total) by mouth daily.   MENS ONE DAILY PO Take 1 tablet by mouth daily.   metFORMIN 850 MG tablet Commonly known as: GLUCOPHAGE Take 1 tablet (850 mg total) by mouth daily with breakfast.   OneTouch Delica Lancets [id number] Misc Use as directed to test blood sugar once daily   OneTouch Ultra test strip Generic drug: glucose blood 1 each by Other route daily. Dx: E11.21 What changed:  how much to take how to take this when to take this additional instructions   pantoprazole 40 MG tablet Commonly known as: PROTONIX Take 1 tablet (40 mg total) by mouth daily. What changed: when to take this   pravastatin 40 MG tablet Commonly known as: PRAVACHOL Take 1 tablet (40 mg total) by mouth daily. What changed: when to take this   sitaGLIPtin 50 MG tablet Commonly known as: JANUVIA Take 1 tablet (50 mg total) by mouth daily. What changed: when to take this   spironolactone 25 MG tablet Commonly known as: ALDACTONE TAKE 1 TABLET BY MOUTH DAILY What changed: when to take this   tamsulosin 0.4 MG Caps capsule Commonly known as: FLOMAX Take 0.4 mg by mouth at bedtime.   triamcinolone ointment 0.5 % Commonly known as: KENALOG Apply 1 Application topically 2 (two) times daily for 14 days. What  changed:  when to take this reasons to take this        Follow-up Information     Wardell Honour, MD. Schedule an appointment as soon as possible for a visit in 1 week(s).   Specialties: Family Medicine, Emergency Medicine Contact information: [address] [place] [address] [phone]         Ceasar Mons, MD. Schedule an appointment as soon as possible for a visit in 1 week(s).   Specialty: Urology Contact information: [address]  [id number] [phone]                No Known Allergies   Other Procedures/Studies: CT ABDOMEN PELVIS WO CONTRAST  Result Date: 04/13/2022 CLINICAL DATA:  Abdominal pain, acute, nonlocalized abd pain, distension, elevated creatinine. r/o acute abd process, r/o urinary obstructive process. EXAM: CT ABDOMEN AND PELVIS WITHOUT CONTRAST TECHNIQUE: Multidetector CT imaging of the abdomen and pelvis was performed following the standard protocol without IV contrast. RADIATION DOSE REDUCTION: This exam was performed according to the departmental dose-optimization program which includes automated exposure control, adjustment of the mA and/or kV according to patient size and/or use of iterative reconstruction technique. COMPARISON:  None Available. FINDINGS: Lower chest: Dependent atelectasis in the lung bases. Coronary artery calcifications. Small hiatal hernia. Hepatobiliary: No focal liver abnormality is seen. No gallstones, gallbladder wall thickening, or biliary dilatation. Pancreas: Unremarkable. No pancreatic ductal dilatation or surrounding inflammatory changes. Spleen: Normal in size without focal abnormality. Adrenals/Urinary Tract: Distended bladder with surrounding fat stranding, associated mild bilateral hydroureteronephrosis and moderate perinephric stranding. No obstructing stone. Associated prostatomegaly. Stomach/Bowel: No dilated loops of small bowel. Mild gaseous distention of the redundant  sigmoid colon. No bowel wall thickening or surrounding inflammation. Vascular/Lymphatic: Aortic atherosclerosis. No enlarged abdominal or pelvic lymph nodes. Reproductive: Enlarged prostate. Other: No abdominal wall hernia or abnormality. Trace fluid along the bilateral paracolic gutters. Musculoskeletal: Bilateral L5 spondylolysis with associated grade 1 anterolisthesis of L5 on S1. No suspicious bone lesions. IMPRESSION: 1. Distended bladder with surrounding fat stranding, associated mild bilateral hydroureteronephrosis and moderate perinephric stranding. No obstructing stone. Findings are concerning for cystitis with possible ascending urinary tract infection in the setting of chronic bladder outlet obstruction due to prostatomegaly. Correlate with urinalysis. 2. Bilateral L5 spondylolysis with associated grade 1 anterolisthesis of L5 on S1. 3. Aortic Atherosclerosis (ICD10-I70.0). Electronically Signed   By: Emmit Alexanders M.D.   On: 04/13/2022 09:58     TODAY-DAY OF DISCHARGE:  Subjective:   [name] today has no headache,no chest abdominal pain,no new weakness tingling or numbness, feels much better wants to go home today.   Objective:   Blood pressure 121/88, pulse 80, temperature 98 F (36.7 C), temperature source Oral, resp. rate 14, weight 98.3 kg, SpO2 96 %.  Intake/Output Summary (Last 24 hours) at 04/16/2022 0956 Last data filed at 04/16/2022 0747 Gross per 24 hour  Intake 507 ml  Output 4701 ml  Net -4194 ml   Filed Weights   04/14/22 0335 04/16/22 0500  Weight: 98.3 kg 98.3 kg    Exam: Awake Alert, Oriented *3, No new F.N deficits, Normal affect Dawson.AT,PERRAL Supple Neck,No JVD, No cervical lymphadenopathy appriciated.  Symmetrical Chest wall movement, Good air movement bilaterally, CTAB RRR,No Gallops,Rubs or new Murmurs, No Parasternal Heave +ve B.Sounds, Abd Soft, Non tender, No organomegaly appriciated, No rebound -guarding or rigidity. No Cyanosis, Clubbing or  edema, No new Rash or bruise   PERTINENT RADIOLOGIC STUDIES: No results found.   PERTINENT LAB RESULTS: CBC: Recent Labs    04/14/22 0339  WBC 11.9*  HGB 13.8  HCT 40.5  PLT 226   CMET CMP     Component Value Date/Time   NA 139 04/16/2022 0802   NA 132 (L) 01/22/2021 1145   K 3.8 04/16/2022 0802   CL 108 04/16/2022 0802   CO2 21 (L) 04/16/2022 0802   GLUCOSE 176 (H) 04/16/2022 0802   BUN 33 (H) 04/16/2022 0802   BUN 27 01/22/2021 1145   CREATININE 1.62 (H) 04/16/2022  0802   CREATININE 1.57 (H) 04/01/2022 1445   CALCIUM 9.1 04/16/2022 0802   PROT 7.4 04/13/2022 0756   PROT 7.9 12/12/2019 1544   ALBUMIN 3.2 (L) 04/13/2022 0756   ALBUMIN 4.7 12/12/2019 1544   AST 22 04/13/2022 0756   ALT 16 04/13/2022 0756   ALKPHOS 56 04/13/2022 0756   BILITOT 0.5 04/13/2022 0756   BILITOT 0.2 12/12/2019 1544   GFRNONAA 46 (L) 04/16/2022 0802   GFRNONAA 62 02/07/2016 1152   GFRAA 50 (L) 12/12/2019 1544   GFRAA 72 02/07/2016 1152    GFR Estimated Creatinine Clearance: 52.9 mL/min (A) (by C-G formula based on SCr of 1.62 mg/dL (H)). No results for input(s): "LIPASE", "AMYLASE" in the last 72 hours. No results for input(s): "CKTOTAL", "CKMB", "CKMBINDEX", "TROPONINI" in the last 72 hours. Invalid input(s): "POCBNP" No results for input(s): "DDIMER" in the last 72 hours. No results for input(s): "HGBA1C" in the last 72 hours. No results for input(s): "CHOL", "HDL", "LDLCALC", "TRIG", "CHOLHDL", "LDLDIRECT" in the last 72 hours. No results for input(s): "TSH", "T4TOTAL", "T3FREE", "THYROIDAB" in the last 72 hours.  Invalid input(s): "FREET3" No results for input(s): "VITAMINB12", "FOLATE", "FERRITIN", "TIBC", "IRON", "RETICCTPCT" in the last 72 hours. Coags: No results for input(s): "INR" in the last 72 hours.  Invalid input(s): "PT" Microbiology: Recent Results (from the past 240 hour(s))  Urine Culture     Status: Abnormal   Collection Time: 04/09/22 11:37 AM   Specimen:  Urine  Result Value Ref Range Status   MICRO NUMBER: [id number]  Final   SPECIMEN QUALITY: Adequate  Final   Sample Source URINE  Final   STATUS: FINAL  Final   ISOLATE 1: Streptococcus agalactiae (A)  Final    Comment: Greater than 100,000 CFU/mL of Group B Streptococcus isolated Beta-hemolytic streptococci are predictably susceptible to Penicillin and other beta-lactams. Susceptibility testing not routinely performed. Please contact the laboratory within 3 days if  susceptibility testing is desired.     FURTHER DISCHARGE INSTRUCTIONS:  Get Medicines reviewed and adjusted: Please take all your medications with you for your next visit with your Primary MD  Laboratory/radiological data: Please request your Primary MD to go over all hospital tests and procedure/radiological results at the follow up, please ask your Primary MD to get all Hospital records sent to his/her office.  In some cases, they will be blood work, cultures and biopsy results pending at the time of your discharge. Please request that your primary care M.D. goes through all the records of your hospital data and follows up on these results.  Also Note the following: If you experience worsening of your admission symptoms, develop shortness of breath, life threatening emergency, suicidal or homicidal thoughts you must seek medical attention immediately by calling 911 or calling your MD immediately  if symptoms less severe.  You must read complete instructions/literature along with all the possible adverse reactions/side effects for all the Medicines you take and that have been prescribed to you. Take any new Medicines after you have completely understood and accpet all the possible adverse reactions/side effects.   Do not drive when taking Pain medications or sleeping medications (Benzodaizepines)  Do not take more than prescribed Pain, Sleep and Anxiety Medications. It is not advisable to combine anxiety,sleep and pain  medications without talking with your primary care practitioner  Special Instructions: If you have smoked or chewed Tobacco  in the last 2 yrs please stop smoking, stop any regular Alcohol  and or any Recreational drug  use.  Wear Seat belts while driving.  Please note: You were cared for by a hospitalist during your hospital stay. Once you are discharged, your primary care physician will handle any further medical issues. Please note that NO REFILLS for any discharge medications will be authorized once you are discharged, as it is imperative that you return to your primary care physician (or establish a relationship with a primary care physician if you do not have one) for your post hospital discharge needs so that they can reassess your need for medications and monitor your lab values.  Total Time spent coordinating discharge including counseling, education and face to face time equals greater than 30 minutes.  SignedOren Binet 04/16/2022 9:56 AM

## 2022-04-16 NOTE — Plan of Care (Signed)
Patient discharging home with spouse with pt/ot/RN/aide with Alvis Lemmings

## 2022-04-17 ENCOUNTER — Telehealth: Payer: Self-pay

## 2022-04-17 DIAGNOSIS — I480 Paroxysmal atrial fibrillation: Secondary | ICD-10-CM | POA: Diagnosis not present

## 2022-04-17 DIAGNOSIS — Z9181 History of falling: Secondary | ICD-10-CM | POA: Diagnosis not present

## 2022-04-17 DIAGNOSIS — I69351 Hemiplegia and hemiparesis following cerebral infarction affecting right dominant side: Secondary | ICD-10-CM | POA: Diagnosis not present

## 2022-04-17 DIAGNOSIS — N179 Acute kidney failure, unspecified: Secondary | ICD-10-CM | POA: Diagnosis not present

## 2022-04-17 DIAGNOSIS — N1832 Chronic kidney disease, stage 3b: Secondary | ICD-10-CM | POA: Diagnosis not present

## 2022-04-17 DIAGNOSIS — I69322 Dysarthria following cerebral infarction: Secondary | ICD-10-CM | POA: Diagnosis not present

## 2022-04-17 DIAGNOSIS — Z7984 Long term (current) use of oral hypoglycemic drugs: Secondary | ICD-10-CM | POA: Diagnosis not present

## 2022-04-17 DIAGNOSIS — N138 Other obstructive and reflux uropathy: Secondary | ICD-10-CM | POA: Diagnosis not present

## 2022-04-17 DIAGNOSIS — E785 Hyperlipidemia, unspecified: Secondary | ICD-10-CM | POA: Diagnosis not present

## 2022-04-17 DIAGNOSIS — Z7901 Long term (current) use of anticoagulants: Secondary | ICD-10-CM | POA: Diagnosis not present

## 2022-04-17 DIAGNOSIS — I2510 Atherosclerotic heart disease of native coronary artery without angina pectoris: Secondary | ICD-10-CM | POA: Diagnosis not present

## 2022-04-17 DIAGNOSIS — B954 Other streptococcus as the cause of diseases classified elsewhere: Secondary | ICD-10-CM | POA: Diagnosis not present

## 2022-04-17 DIAGNOSIS — K219 Gastro-esophageal reflux disease without esophagitis: Secondary | ICD-10-CM | POA: Diagnosis not present

## 2022-04-17 DIAGNOSIS — I5032 Chronic diastolic (congestive) heart failure: Secondary | ICD-10-CM | POA: Diagnosis not present

## 2022-04-17 DIAGNOSIS — E1122 Type 2 diabetes mellitus with diabetic chronic kidney disease: Secondary | ICD-10-CM | POA: Diagnosis not present

## 2022-04-17 DIAGNOSIS — N12 Tubulo-interstitial nephritis, not specified as acute or chronic: Secondary | ICD-10-CM | POA: Diagnosis not present

## 2022-04-17 DIAGNOSIS — I130 Hypertensive heart and chronic kidney disease with heart failure and stage 1 through stage 4 chronic kidney disease, or unspecified chronic kidney disease: Secondary | ICD-10-CM | POA: Diagnosis not present

## 2022-04-17 DIAGNOSIS — Z466 Encounter for fitting and adjustment of urinary device: Secondary | ICD-10-CM | POA: Diagnosis not present

## 2022-04-17 NOTE — Transitions of Care (Post Inpatient/ED Visit) (Signed)
   04/17/2022  Name: [name] MRN: [id number] DOB: [birth year]  Today's TOC FU Call Status: Today's TOC FU Call Status:: Unsuccessul Call (1st Attempt) Unsuccessful Call (1st Attempt) Date: 04/17/22  Attempted to reach the patient regarding the most recent Inpatient/ED visit.  Follow Up Plan: Additional outreach attempts will be made to reach the patient to complete the Transitions of Care (Post Inpatient/ED visit) call.      Antionette Fairy, RN,BSN,CCM [facility] Care Management Care Management Community Coordinator Direct Phone: [phone] Toll Free: [phone] Fax: [phone]

## 2022-04-17 NOTE — Telephone Encounter (Signed)
[name] is calling for verbal orders to see patient for Once a Week for four weeks for UTI Prevention and Foley Care. Patient can be reached at [phone].

## 2022-04-20 ENCOUNTER — Telehealth: Payer: Self-pay

## 2022-04-20 DIAGNOSIS — I130 Hypertensive heart and chronic kidney disease with heart failure and stage 1 through stage 4 chronic kidney disease, or unspecified chronic kidney disease: Secondary | ICD-10-CM | POA: Diagnosis not present

## 2022-04-20 DIAGNOSIS — E785 Hyperlipidemia, unspecified: Secondary | ICD-10-CM | POA: Diagnosis not present

## 2022-04-20 DIAGNOSIS — N1832 Chronic kidney disease, stage 3b: Secondary | ICD-10-CM | POA: Diagnosis not present

## 2022-04-20 DIAGNOSIS — I69322 Dysarthria following cerebral infarction: Secondary | ICD-10-CM | POA: Diagnosis not present

## 2022-04-20 DIAGNOSIS — I2510 Atherosclerotic heart disease of native coronary artery without angina pectoris: Secondary | ICD-10-CM | POA: Diagnosis not present

## 2022-04-20 DIAGNOSIS — E1122 Type 2 diabetes mellitus with diabetic chronic kidney disease: Secondary | ICD-10-CM | POA: Diagnosis not present

## 2022-04-20 DIAGNOSIS — Z466 Encounter for fitting and adjustment of urinary device: Secondary | ICD-10-CM | POA: Diagnosis not present

## 2022-04-20 DIAGNOSIS — I5032 Chronic diastolic (congestive) heart failure: Secondary | ICD-10-CM | POA: Diagnosis not present

## 2022-04-20 DIAGNOSIS — N12 Tubulo-interstitial nephritis, not specified as acute or chronic: Secondary | ICD-10-CM | POA: Diagnosis not present

## 2022-04-20 DIAGNOSIS — Z7901 Long term (current) use of anticoagulants: Secondary | ICD-10-CM | POA: Diagnosis not present

## 2022-04-20 DIAGNOSIS — B954 Other streptococcus as the cause of diseases classified elsewhere: Secondary | ICD-10-CM | POA: Diagnosis not present

## 2022-04-20 DIAGNOSIS — K219 Gastro-esophageal reflux disease without esophagitis: Secondary | ICD-10-CM | POA: Diagnosis not present

## 2022-04-20 DIAGNOSIS — I480 Paroxysmal atrial fibrillation: Secondary | ICD-10-CM | POA: Diagnosis not present

## 2022-04-20 DIAGNOSIS — I69351 Hemiplegia and hemiparesis following cerebral infarction affecting right dominant side: Secondary | ICD-10-CM | POA: Diagnosis not present

## 2022-04-20 DIAGNOSIS — N138 Other obstructive and reflux uropathy: Secondary | ICD-10-CM | POA: Diagnosis not present

## 2022-04-20 DIAGNOSIS — N179 Acute kidney failure, unspecified: Secondary | ICD-10-CM | POA: Diagnosis not present

## 2022-04-20 NOTE — Transitions of Care (Post Inpatient/ED Visit) (Signed)
   04/20/2022  Name: [name] MRN: [id number] DOB: 1955/08/01  Today's TOC FU Call Status: Today's TOC FU Call Status:: Successful TOC FU Call Competed TOC FU Call Complete Date: 04/20/22  Transition Care Management Follow-up Telephone Call Date of Discharge: 04/16/22 Discharge Facility: [facility]) Type of Discharge: Inpatient Admission Primary Inpatient Discharge Diagnosis:: "abd pain-generalized,ARF" How have you been since you were released from the hospital?: Better (Patient denies any further GI issues-states he has beendoing well-getting adjusted to having foley in place. States urine is clear yellow and has had no issues with it.) Any questions or concerns?: Yes Patient Questions/Concerns:: Patient states he can not find d/c paperwork Patient Questions/Concerns Addressed: Other: (RN CM reviewd all info on d/c paperwork with pt. He wrote down important info and numbers. Confirms he was taking mes as ordered.)  Items Reviewed: Did you receive and understand the discharge instructions provided?: Yes Medications obtained and verified?: Yes (Medications Reviewed) Any new allergies since your discharge?: No Dietary orders reviewed?: Yes Type of Diet Ordered:: low salt/heart healthy/carb modified Do you have support at home?: Yes People in Home: alone  Home Care and Equipment/Supplies: Were Home Health Services Ordered?: Yes Name of Home Health Agency:: [facility] Has Agency set up a time to come to your home?: Yes First Home Health Visit Date: 04/17/22 Any new equipment or medical supplies ordered?: NA  Functional Questionnaire: Do you need assistance with bathing/showering or dressing?: No Do you need assistance with eating?: No Do you have difficulty maintaining continence: No Do you need assistance with getting out of bed/getting out of a chair/moving?: No Do you have difficulty managing or taking your medications?: No  Follow up appointments reviewed: PCP Follow-up  appointment confirmed?: No (Offered to assist pt with making appt while on call-he states he prefers to call on his on later today once he gets transportation aranged) MD Provider Line Number:[phone] Given: No Specialist Hospital Follow-up appointment confirmed?: No Reason Specialist Follow-Up Not Confirmed:  (provided pt with urologist contact info to call and make an appt-he will call office today) Do you need transportation to your follow-up appointment?: Yes Transportation Need Intervention Addressed By:: Other: (pt states he has transportation arranged and just has to call and get it set up) Do you understand care options if your condition(s) worsen?: Yes-patient verbalized understanding  SDOH Interventions Today    Flowsheet Row Most Recent Value  SDOH Interventions   Food Insecurity Interventions Intervention Not Indicated  Transportation Interventions Other (Comment)  [pt states he has info to call to arrange transportation for follow up appts-denies nerding any furthr transportation resources at this time]       TOC Interventions Today    Flowsheet Row Most Recent Value  TOC Interventions   TOC Interventions Discussed/Reviewed TOC Interventions Discussed      Interventions Today    Flowsheet Row Most Recent Value  General Interventions   General Interventions Discussed/Reviewed General Interventions Discussed, Doctor Visits  Doctor Visits Discussed/Reviewed Doctor Visits Discussed, PCP, Specialist  PCP/Specialist Visits Compliance with follow-up visit  Education Interventions   Education Provided Provided Education  Provided Verbal Education On Nutrition, Medication, When to see the doctor  Nutrition Interventions   Nutrition Discussed/Reviewed Nutrition Discussed  Pharmacy Interventions   Pharmacy Dicussed/Reviewed Pharmacy Topics Discussed, Medications and their functions        Alessandra Grout [facility] Care Management Care  Management Community Coordinator Direct Phone: [phone] Toll Free: [phone] Fax: [phone]

## 2022-04-21 ENCOUNTER — Telehealth: Payer: Self-pay

## 2022-04-21 DIAGNOSIS — E785 Hyperlipidemia, unspecified: Secondary | ICD-10-CM | POA: Diagnosis not present

## 2022-04-21 DIAGNOSIS — N12 Tubulo-interstitial nephritis, not specified as acute or chronic: Secondary | ICD-10-CM | POA: Diagnosis not present

## 2022-04-21 DIAGNOSIS — Z466 Encounter for fitting and adjustment of urinary device: Secondary | ICD-10-CM | POA: Diagnosis not present

## 2022-04-21 DIAGNOSIS — N179 Acute kidney failure, unspecified: Secondary | ICD-10-CM | POA: Diagnosis not present

## 2022-04-21 DIAGNOSIS — N1832 Chronic kidney disease, stage 3b: Secondary | ICD-10-CM | POA: Diagnosis not present

## 2022-04-21 DIAGNOSIS — I5032 Chronic diastolic (congestive) heart failure: Secondary | ICD-10-CM | POA: Diagnosis not present

## 2022-04-21 DIAGNOSIS — I2510 Atherosclerotic heart disease of native coronary artery without angina pectoris: Secondary | ICD-10-CM | POA: Diagnosis not present

## 2022-04-21 DIAGNOSIS — Z7901 Long term (current) use of anticoagulants: Secondary | ICD-10-CM | POA: Diagnosis not present

## 2022-04-21 DIAGNOSIS — I69351 Hemiplegia and hemiparesis following cerebral infarction affecting right dominant side: Secondary | ICD-10-CM | POA: Diagnosis not present

## 2022-04-21 DIAGNOSIS — B954 Other streptococcus as the cause of diseases classified elsewhere: Secondary | ICD-10-CM | POA: Diagnosis not present

## 2022-04-21 DIAGNOSIS — E1122 Type 2 diabetes mellitus with diabetic chronic kidney disease: Secondary | ICD-10-CM | POA: Diagnosis not present

## 2022-04-21 DIAGNOSIS — K219 Gastro-esophageal reflux disease without esophagitis: Secondary | ICD-10-CM | POA: Diagnosis not present

## 2022-04-21 DIAGNOSIS — I480 Paroxysmal atrial fibrillation: Secondary | ICD-10-CM | POA: Diagnosis not present

## 2022-04-21 DIAGNOSIS — I69322 Dysarthria following cerebral infarction: Secondary | ICD-10-CM | POA: Diagnosis not present

## 2022-04-21 DIAGNOSIS — N138 Other obstructive and reflux uropathy: Secondary | ICD-10-CM | POA: Diagnosis not present

## 2022-04-21 DIAGNOSIS — I130 Hypertensive heart and chronic kidney disease with heart failure and stage 1 through stage 4 chronic kidney disease, or unspecified chronic kidney disease: Secondary | ICD-10-CM | POA: Diagnosis not present

## 2022-04-21 NOTE — Patient Outreach (Signed)
  Care Coordination   Follow Up Visit Note   04/21/2022 Name: [name] MRN: [id number] DOB: 31-Aug-1955  Voicemail message received from patient. Return call to patient. He voices that he is doing well. No issues with catheter. States he was able to make urology appt-goes tomorrow morning. He plans to call PCP office today to make follow up appt. RN CM offered again to assist pt with scheduling PCP appt but he declined-states he prefers to call on his own and will do so. Patient voices he will call and arrange transportation for appts. Patient states he had called as he was wanting to know number for [facility] as he had not heard from them yet. He states HHPT was out yesterday to visit him and will be back Friday. Patient states while he was waiting for RN CM to return call OT called him and will be coming out later today to make initial visit. Confirmed pt has d/c paperwork with all important contact info numbers on them and encouraged him to save numbers in his phone. He voiced understanding. No further RN CM needs or concerns at this time.     Care Coordination Interventions:  Yes, provided   Interventions Today    Flowsheet Row Most Recent Value  General Interventions   General Interventions Discussed/Reviewed General Interventions Reviewed, Doctor Visits  Doctor Visits Discussed/Reviewed PCP, Specialist, Doctor Visits Reviewed        Follow up plan: No further intervention required.   Encounter Outcome:  Pt. Visit Completed   Alessandra Grout [facility] Care Management Care Management Community Coordinator Direct Phone: [phone] Toll Free: [phone] Fax: [phone]

## 2022-04-21 NOTE — Telephone Encounter (Signed)
Approve order for [facility]

## 2022-04-22 ENCOUNTER — Telehealth: Payer: Self-pay

## 2022-04-22 DIAGNOSIS — I4891 Unspecified atrial fibrillation: Secondary | ICD-10-CM

## 2022-04-22 DIAGNOSIS — N10 Acute tubulo-interstitial nephritis: Secondary | ICD-10-CM | POA: Diagnosis not present

## 2022-04-22 DIAGNOSIS — R338 Other retention of urine: Secondary | ICD-10-CM | POA: Diagnosis not present

## 2022-04-22 NOTE — Telephone Encounter (Signed)
   Telephone encounter was:  Successful.  04/22/2022 Name: [name] MRN: [id number] DOB: 1955-04-02  [name] is a 67 y.o. year old male who is a primary care patient of Frederica Kuster, MD . The community resource team was consulted for assistance with Transportation Needs   Care guide performed the following interventions: Patient provided with information about care guide support team and interviewed to confirm resource needs.Patient has no transportation needs at this time and has transportation benefits 10 rides with [employer]. I have emailed [employer], [place] [employer]   Follow Up Plan:  No further follow up planned at this time. The patient has been provided with needed resources.    Lenard Forth [facility] Guide, [facility] [phone] [address] [place], [place], [place] [address] Phone: [phone] Email: [email or web address]

## 2022-04-22 NOTE — Patient Outreach (Addendum)
  Care Coordination   Follow Up Visit Note   04/22/2022 Name: [name] MRN: [id number] DOB: [birth year]  Incoming call from patient. He states he completed urology appt this morning. Patient states he has called and made PCP appt. He has been having his friends take him to appts. He thinks the friend that normally takes him to appts has an appt of his won tomorrow. He is not sure but will verify. He is now interested in finding out more info about transportation resources that RN CM had previously mentioned. States he thought he had info on transportation but has been unable to locate it. Patient states he is "hopeful" that his friend will be able to take him to appt tomorrow. If not., he was going to call some other friends today. Patient states he can call and  move PCP to later this week if needed since he saw urology today.   SDOH assessments and interventions completed:  Yes  SDOH Interventions Today    Flowsheet Row Most Recent Value  SDOH Interventions   Transportation Interventions Other (Comment)  [referral to care guide placed]        Care Coordination Interventions:  Yes, provided   Follow up plan: No further intervention required.   Encounter Outcome:  Pt. Visit Completed   Alessandra Grout [facility] Care Management Care Management Community Coordinator Direct Phone: [phone] Toll Free: [phone] Fax: [phone]

## 2022-04-22 NOTE — Patient Outreach (Signed)
  Care Coordination   04/22/2022 Name: [name] MRN: [id number] DOB: 25-Jun-1955     Voicemail message received from patient requesting a return call. Call placed to patient and no answer at present. VM message left along with contact info.  Follow Up Plan:  RN CM will make an additional attempt to try to reach patient.   Encounter Outcome:  No Answer   Care Coordination Interventions:  No, not indicated    Antionette Fairy, RN,BSN,CCM [facility] Care Management Care Management Community Coordinator Direct Phone: [phone] Toll Free: [phone] Fax: [phone]

## 2022-04-23 ENCOUNTER — Encounter: Payer: Self-pay | Admitting: Adult Health

## 2022-04-23 ENCOUNTER — Ambulatory Visit (INDEPENDENT_AMBULATORY_CARE_PROVIDER_SITE_OTHER): Payer: Medicare Other | Admitting: Adult Health

## 2022-04-23 VITALS — BP 124/90 | HR 74 | Temp 97.1°F | Resp 16 | Ht 71.0 in | Wt 222.0 lb

## 2022-04-23 DIAGNOSIS — I480 Paroxysmal atrial fibrillation: Secondary | ICD-10-CM

## 2022-04-23 DIAGNOSIS — N1831 Chronic kidney disease, stage 3a: Secondary | ICD-10-CM | POA: Diagnosis not present

## 2022-04-23 DIAGNOSIS — I6930 Unspecified sequelae of cerebral infarction: Secondary | ICD-10-CM

## 2022-04-23 DIAGNOSIS — I5032 Chronic diastolic (congestive) heart failure: Secondary | ICD-10-CM

## 2022-04-23 DIAGNOSIS — E0821 Diabetes mellitus due to underlying condition with diabetic nephropathy: Secondary | ICD-10-CM

## 2022-04-23 DIAGNOSIS — N390 Urinary tract infection, site not specified: Secondary | ICD-10-CM

## 2022-04-23 DIAGNOSIS — I10 Essential (primary) hypertension: Secondary | ICD-10-CM

## 2022-04-23 DIAGNOSIS — N401 Enlarged prostate with lower urinary tract symptoms: Secondary | ICD-10-CM | POA: Diagnosis not present

## 2022-04-23 DIAGNOSIS — R319 Hematuria, unspecified: Secondary | ICD-10-CM

## 2022-04-23 DIAGNOSIS — N138 Other obstructive and reflux uropathy: Secondary | ICD-10-CM

## 2022-04-23 NOTE — Progress Notes (Signed)
[facility] clinic  Provider:  Kenard Gower DNP  Code Status:  Full Code  Goals of Care:     04/16/2022   11:59 AM  Advanced Directives  Does Patient Have a Medical Advance Directive? No  Would patient like information on creating a medical advance directive? No - Patient declined     Chief Complaint  Patient presents with   Follow-up    Patient was in hospital for abdominal pain and now has a catheter. He no longer has abdominal pin or tenderness and no constipation. He is supposed to have labs? Saw urologist yesterday who tried to take out catheter but he was unable to go on his own so they put it back    HPI: Patient is a 67 y.o. male seen today for hospitalization follow up. He was hospitalized 04/13/22 to 04/16/22. He has a PMH of CVA, hypertension, CKD stage 3b and CHF. He was seen at [facility] on 04/09/22 and was started on  Bactrim DS. He presented to ED with abdominal pain on 04/13/22 and was found to have AKI due to obstructive uropathy. CT of abdomen/pelvis showed distended bladder with surrounding fat stranding-mild bilateral hydronephrosis, moderate perinephric stranding. Findings concerning for cystitis/ascending urinary tract infection- in the setting of chronic bladder outlet obstruction due to prostatomegaly.Foley catheter was placed. He was started on IV Rocephin then transitioned to Keflex.  Past Medical History:  Diagnosis Date   A-fib 11/19/2010   Acute exacerbation of congestive heart failure 11/19/2010   Arthritis    CAD (coronary artery disease) 11/20/2010   CHF (congestive heart failure)    CKD (chronic kidney disease) stage 3, GFR 30-59 ml/min 05/04/2017   Coronary artery disease    Diabetes type 2, controlled 11/19/2010   GERD (gastroesophageal reflux disease) 02/08/2015   Gout    Gout 11/20/2010   History of cerebrovascular accident (CVA) with residual deficit 05/04/2017   History of CVA (cerebrovascular accident) 11/19/2010   HTN (hypertension) 07/01/2012    Hyperlipemia 11/21/2010   Hypertension    Hypertensive emergency 11/19/2010   ICH (intracerebral hemorrhage) 12/22/2010   Physical deconditioning 12/22/2010   Pulmonary edema 11/19/2010   Respiratory failure 11/19/2010   Shortness of breath    Stroke 11/19/2010   Thyroiditis 11/20/2010    Past Surgical History:  Procedure Laterality Date   PEG PLACEMENT  12/03/2010   Procedure: PERCUTANEOUS ENDOSCOPIC GASTROSTOMY (PEG) PLACEMENT;  Surgeon: Hart Carwin, MD;  Location: [facility] ENDOSCOPY;  Service: Endoscopy;  Laterality: N/A;   TRACHEOSTOMY TUBE PLACEMENT  11/28/2010   Procedure: TRACHEOSTOMY;  Surgeon: Susy Frizzle, MD;  Location: [facility] OR;  Service: ENT;  Laterality: N/A;    No Known Allergies  Outpatient Encounter Medications as of 04/23/2022  Medication Sig   acetaminophen (TYLENOL) 500 MG tablet Take 1,000 mg by mouth daily as needed for moderate pain, fever or headache.   Alcohol Swabs (ALCOHOL PREP) 70 % PADS Use to test blood sugar daily. Dx: E11.21   apixaban (ELIQUIS) 5 MG TABS tablet Take 1 tablet (5 mg total) by mouth 2 (two) times daily.   blood glucose meter kit and supplies Dispense based on patient and insurance preference. Use up to four times daily as directed. (FOR ICD-10 E10.9, E11.9).   carvedilol (COREG) 25 MG tablet Take 1 tablet (25 mg total) by mouth 2 (two) times daily with a meal. Needs an appointment before anymore future refills.   cephALEXin (KEFLEX) 500 MG capsule Take 1 capsule (500 mg total) by mouth 2 (  two) times daily for 10 days.   diltiazem (CARDIZEM CD) 180 MG 24 hr capsule TAKE 2 CAPSULES BY MOUTH  DAILY AT NOON (Patient taking differently: Take 180 mg by mouth See admin instructions. 180 mg twice daily at noon and at bedtime.)   empagliflozin (JARDIANCE) 10 MG TABS tablet Take 1 tablet (10 mg total) by mouth daily.   finasteride (PROSCAR) 5 MG tablet Take 1 tablet (5 mg total) by mouth daily.   glucose blood (ONETOUCH ULTRA) test strip 1 each by Other  route daily. Dx: E11.21   isosorbide mononitrate (IMDUR) 30 MG 24 hr tablet Take 1 tablet (30 mg total) by mouth daily.   losartan (COZAAR) 25 MG tablet Take 1 tablet (25 mg total) by mouth daily.   metFORMIN (GLUCOPHAGE) 850 MG tablet Take 1 tablet (850 mg total) by mouth daily with breakfast.   Multiple Vitamins-Minerals (MENS ONE DAILY PO) Take 1 tablet by mouth daily.   ONETOUCH DELICA LANCETS 33G MISC Use as directed to test blood sugar once daily   pantoprazole (PROTONIX) 40 MG tablet Take 1 tablet (40 mg total) by mouth daily. (Patient taking differently: Take 40 mg by mouth daily with supper.)   pravastatin (PRAVACHOL) 40 MG tablet Take 1 tablet (40 mg total) by mouth daily. (Patient taking differently: Take 40 mg by mouth at bedtime.)   sitaGLIPtin (JANUVIA) 50 MG tablet Take 1 tablet (50 mg total) by mouth daily. (Patient taking differently: Take 50 mg by mouth daily with lunch.)   spironolactone (ALDACTONE) 25 MG tablet TAKE 1 TABLET BY MOUTH DAILY (Patient taking differently: Take 25 mg by mouth daily with lunch.)   tamsulosin (FLOMAX) 0.4 MG CAPS capsule Take 0.4 mg by mouth at bedtime.   triamcinolone ointment (KENALOG) 0.5 % Apply 1 Application topically 2 (two) times daily for 14 days. (Patient taking differently: Apply 1 Application topically 2 (two) times daily as needed (irritation).)   No facility-administered encounter medications on file as of 04/23/2022.    Review of Systems:  Review of Systems  Constitutional:  Negative for activity change, appetite change and fever.  HENT:  Negative for sore throat.   Eyes: Negative.   Cardiovascular:  Negative for chest pain and leg swelling.  Gastrointestinal:  Negative for abdominal distention, diarrhea and vomiting.  Genitourinary:  Negative for dysuria, frequency and urgency.  Skin:  Negative for color change.  Neurological:  Negative for dizziness and headaches.  Psychiatric/Behavioral:  Negative for behavioral problems and  sleep disturbance. The patient is not nervous/anxious.     Health Maintenance  Topic Date Due   Medicare Annual Wellness (AWV)  Never done   DTaP/Tdap/Td (1 - Tdap) Never done   Pneumonia Vaccine 5465+ Years old (2 of 2 - PCV) 11/21/2011   Zoster Vaccines- Shingrix (2 of 2) 08/14/2015   OPHTHALMOLOGY EXAM  06/23/2016   Diabetic kidney evaluation - Urine ACR  05/05/2018   COLONOSCOPY (Pts 45-7518yrs Insurance coverage will need to be confirmed)  02/01/2019   COVID-19 Vaccine (4 - 2023-24 season) 09/12/2021   INFLUENZA VACCINE  08/13/2022   HEMOGLOBIN A1C  10/02/2022   FOOT EXAM  03/12/2023   Diabetic kidney evaluation - eGFR measurement  04/16/2023   Hepatitis C Screening  Completed   HPV VACCINES  Aged Out    Physical Exam: Vitals:   04/23/22 1421  BP: (!) 124/90  Pulse: 74  Resp: 16  Temp: (!) 97.1 F (36.2 C)  TempSrc: Temporal  SpO2: 94%  Weight: 222 lb (  100.7 kg)  Height: 5\' 11"  (1.803 m)   Body mass index is 30.96 kg/m. Physical Exam Constitutional:      Appearance: He is obese.  HENT:     Head: Normocephalic and atraumatic.     Mouth/Throat:     Mouth: Mucous membranes are moist.  Eyes:     Conjunctiva/sclera: Conjunctivae normal.  Cardiovascular:     Rate and Rhythm: Normal rate and regular rhythm.     Pulses: Normal pulses.     Heart sounds: Normal heart sounds.  Pulmonary:     Effort: Pulmonary effort is normal.     Breath sounds: Normal breath sounds.  Abdominal:     General: Bowel sounds are normal.     Palpations: Abdomen is soft.  Genitourinary:    Comments: Has foley catheter with urine bag. Musculoskeletal:        General: No swelling. Normal range of motion.     Cervical back: Normal range of motion.  Skin:    General: Skin is warm and dry.  Neurological:     General: No focal deficit present.     Mental Status: He is alert and oriented to person, place, and time.  Psychiatric:        Mood and Affect: Mood normal.        Behavior:  Behavior normal.        Thought Content: Thought content normal.        Judgment: Judgment normal.     Labs reviewed: Basic Metabolic Panel: Recent Labs    04/13/22 1522 04/13/22 1907 04/14/22 0339 04/15/22 0923 04/16/22 0802  NA 132*   < > 131* 137 139  K 5.1   < > 4.3 3.7 3.8  CL 96*   < > 100 102 108  CO2 19*   < > 17* 21* 21*  GLUCOSE 209*   < > 175* 172* 176*  BUN 98*   < > 78* 45* 33*  CREATININE 7.69*   < > 5.20* 2.37* 1.62*  CALCIUM 9.6   < > 9.2 9.4 9.1  MG 2.5*  --  2.3  --   --   PHOS 5.0*  --   --   --   --    < > = values in this interval not displayed.   Liver Function Tests: Recent Labs    11/26/21 1335 04/13/22 0756  AST 14 22  ALT 12 16  ALKPHOS  --  56  BILITOT 0.4 0.5  PROT 7.5 7.4  ALBUMIN  --  3.2*   Recent Labs    04/13/22 0756  LIPASE 29   No results for input(s): "AMMONIA" in the last 8760 hours. CBC: Recent Labs    04/13/22 0756 04/14/22 0339  WBC 12.2* 11.9*  HGB 13.9 13.8  HCT 42.5 40.5  MCV 90.0 88.2  PLT 210 226   Lipid Panel: Recent Labs    11/26/21 1335  CHOL 139  HDL 53  LDLCALC 64  TRIG 137  CHOLHDL 2.6   Lab Results  Component Value Date   HGBA1C 8.3 (H) 04/01/2022    Procedures since last visit: CT ABDOMEN PELVIS WO CONTRAST  Result Date: 04/13/2022 CLINICAL DATA:  Abdominal pain, acute, nonlocalized abd pain, distension, elevated creatinine. r/o acute abd process, r/o urinary obstructive process. EXAM: CT ABDOMEN AND PELVIS WITHOUT CONTRAST TECHNIQUE: Multidetector CT imaging of the abdomen and pelvis was performed following the standard protocol without IV contrast. RADIATION DOSE REDUCTION: This exam was performed according  to the departmental dose-optimization program which includes automated exposure control, adjustment of the mA and/or kV according to patient size and/or use of iterative reconstruction technique. COMPARISON:  None Available. FINDINGS: Lower chest: Dependent atelectasis in the lung bases.  Coronary artery calcifications. Small hiatal hernia. Hepatobiliary: No focal liver abnormality is seen. No gallstones, gallbladder wall thickening, or biliary dilatation. Pancreas: Unremarkable. No pancreatic ductal dilatation or surrounding inflammatory changes. Spleen: Normal in size without focal abnormality. Adrenals/Urinary Tract: Distended bladder with surrounding fat stranding, associated mild bilateral hydroureteronephrosis and moderate perinephric stranding. No obstructing stone. Associated prostatomegaly. Stomach/Bowel: No dilated loops of small bowel. Mild gaseous distention of the redundant sigmoid colon. No bowel wall thickening or surrounding inflammation. Vascular/Lymphatic: Aortic atherosclerosis. No enlarged abdominal or pelvic lymph nodes. Reproductive: Enlarged prostate. Other: No abdominal wall hernia or abnormality. Trace fluid along the bilateral paracolic gutters. Musculoskeletal: Bilateral L5 spondylolysis with associated grade 1 anterolisthesis of L5 on S1. No suspicious bone lesions. IMPRESSION: 1. Distended bladder with surrounding fat stranding, associated mild bilateral hydroureteronephrosis and moderate perinephric stranding. No obstructing stone. Findings are concerning for cystitis with possible ascending urinary tract infection in the setting of chronic bladder outlet obstruction due to prostatomegaly. Correlate with urinalysis. 2. Bilateral L5 spondylolysis with associated grade 1 anterolisthesis of L5 on S1. 3. Aortic Atherosclerosis (ICD10-I70.0). Electronically Signed   By: Orvan Falconer M.D.   On: 04/13/2022 09:58    Assessment/Plan 1. Benign prostatic hyperplasia with urinary obstruction -  has foley catheter -  continue Flomax and Finasteride -  follow up with urology on 4/19  2. Stage 3a chronic kidney disease -  will monitor - Complete Metabolic Panel with eGFR  3. Primary hypertension -  BP 124/90, stable -  continue Carvedilol, Diltiazem and losartan  4.  Diabetes due to underlying condition w diabetic nephropathy Lab Results  Component Value Date   HGBA1C 8.3 (H) 04/01/2022   -  continue Metformin, Jardiance and Januvia -  monitor CBGs, log and bring to next appointment  5. Urinary tract infection with hematuria, site unspecified -  continue Keflex - CBC With Differential/Platelet  6. Chronic diastolic CHF (congestive heart failure) -  no SOB -  continue Isosorbide MN, Aldactone  7. History of cerebrovascular accident (CVA) with residual deficit -  stable -  Has Home health PT and Nurse   8. PAF (paroxysmal atrial fibrillation) -   rate-controlled -  continue Eliquis for anticoagulation, Coreg and Diltiazem for rate-control    Labs/tests ordered:    Next appt:  Visit date not found

## 2022-04-24 ENCOUNTER — Telehealth: Payer: Self-pay

## 2022-04-24 DIAGNOSIS — N138 Other obstructive and reflux uropathy: Secondary | ICD-10-CM | POA: Diagnosis not present

## 2022-04-24 DIAGNOSIS — I69351 Hemiplegia and hemiparesis following cerebral infarction affecting right dominant side: Secondary | ICD-10-CM | POA: Diagnosis not present

## 2022-04-24 DIAGNOSIS — E785 Hyperlipidemia, unspecified: Secondary | ICD-10-CM | POA: Diagnosis not present

## 2022-04-24 DIAGNOSIS — K219 Gastro-esophageal reflux disease without esophagitis: Secondary | ICD-10-CM | POA: Diagnosis not present

## 2022-04-24 DIAGNOSIS — N179 Acute kidney failure, unspecified: Secondary | ICD-10-CM | POA: Diagnosis not present

## 2022-04-24 DIAGNOSIS — I480 Paroxysmal atrial fibrillation: Secondary | ICD-10-CM | POA: Diagnosis not present

## 2022-04-24 DIAGNOSIS — Z466 Encounter for fitting and adjustment of urinary device: Secondary | ICD-10-CM | POA: Diagnosis not present

## 2022-04-24 DIAGNOSIS — E1122 Type 2 diabetes mellitus with diabetic chronic kidney disease: Secondary | ICD-10-CM | POA: Diagnosis not present

## 2022-04-24 DIAGNOSIS — I130 Hypertensive heart and chronic kidney disease with heart failure and stage 1 through stage 4 chronic kidney disease, or unspecified chronic kidney disease: Secondary | ICD-10-CM | POA: Diagnosis not present

## 2022-04-24 DIAGNOSIS — N12 Tubulo-interstitial nephritis, not specified as acute or chronic: Secondary | ICD-10-CM | POA: Diagnosis not present

## 2022-04-24 DIAGNOSIS — I69322 Dysarthria following cerebral infarction: Secondary | ICD-10-CM | POA: Diagnosis not present

## 2022-04-24 DIAGNOSIS — N1832 Chronic kidney disease, stage 3b: Secondary | ICD-10-CM | POA: Diagnosis not present

## 2022-04-24 DIAGNOSIS — I5032 Chronic diastolic (congestive) heart failure: Secondary | ICD-10-CM | POA: Diagnosis not present

## 2022-04-24 DIAGNOSIS — B954 Other streptococcus as the cause of diseases classified elsewhere: Secondary | ICD-10-CM | POA: Diagnosis not present

## 2022-04-24 DIAGNOSIS — I2510 Atherosclerotic heart disease of native coronary artery without angina pectoris: Secondary | ICD-10-CM | POA: Diagnosis not present

## 2022-04-24 DIAGNOSIS — Z7901 Long term (current) use of anticoagulants: Secondary | ICD-10-CM | POA: Diagnosis not present

## 2022-04-24 LAB — CBC WITH DIFFERENTIAL/PLATELET
Absolute Monocytes: 816 cells/uL (ref 200–950)
Basophils Absolute: 40 cells/uL (ref 0–200)
Basophils Relative: 0.5 %
Eosinophils Absolute: 48 cells/uL (ref 15–500)
Eosinophils Relative: 0.6 %
HCT: 40 % (ref 38.5–50.0)
Hemoglobin: 13.2 g/dL (ref 13.2–17.1)
Lymphs Abs: 2992 cells/uL (ref 850–3900)
MCH: 29.2 pg (ref 27.0–33.0)
MCHC: 33 g/dL (ref 32.0–36.0)
MCV: 88.5 fL (ref 80.0–100.0)
MPV: 9.4 fL (ref 7.5–12.5)
Monocytes Relative: 10.2 %
Neutro Abs: 4104 cells/uL (ref 1500–7800)
Neutrophils Relative %: 51.3 %
Platelets: 415 10*3/uL — ABNORMAL HIGH (ref 140–400)
RBC: 4.52 10*6/uL (ref 4.20–5.80)
RDW: 14.5 % (ref 11.0–15.0)
Total Lymphocyte: 37.4 %
WBC: 8 10*3/uL (ref 3.8–10.8)

## 2022-04-24 LAB — COMPLETE METABOLIC PANEL WITH GFR
AG Ratio: 1.2 (calc) (ref 1.0–2.5)
ALT: 19 U/L (ref 9–46)
AST: 18 U/L (ref 10–35)
Albumin: 3.8 g/dL (ref 3.6–5.1)
Alkaline phosphatase (APISO): 65 U/L (ref 35–144)
BUN/Creatinine Ratio: 21 (calc) (ref 6–22)
BUN: 28 mg/dL — ABNORMAL HIGH (ref 7–25)
CO2: 26 mmol/L (ref 20–32)
Calcium: 10 mg/dL (ref 8.6–10.3)
Chloride: 99 mmol/L (ref 98–110)
Creat: 1.33 mg/dL (ref 0.70–1.35)
Globulin: 3.2 g/dL (calc) (ref 1.9–3.7)
Glucose, Bld: 106 mg/dL — ABNORMAL HIGH (ref 65–99)
Potassium: 3.9 mmol/L (ref 3.5–5.3)
Sodium: 137 mmol/L (ref 135–146)
Total Bilirubin: 0.4 mg/dL (ref 0.2–1.2)
Total Protein: 7 g/dL (ref 6.1–8.1)
eGFR: 59 mL/min/{1.73_m2} — ABNORMAL LOW (ref >=60)

## 2022-04-24 NOTE — Telephone Encounter (Signed)
I called patient to schedule AWV. Patient will call back to schedule because patient was occupied at the moment. Patient also needs to schedule a six month follow up appointment with Dr. Hyacinth Meeker.

## 2022-04-24 NOTE — Patient Outreach (Signed)
  Care Coordination   Follow Up Visit Note   04/24/2022 Name: [name] MRN: [id number] DOB: 10/20/1955   Outreach call to patient. He states he is doing okay. He went to urology appt and they took foley out but he was unable to void so had to have it put back in. He goes back next week and is hopeful he will be able to void on his own. He confirms that he spoke with care guide regarding transportation resources. He was set up with transportation via his insurance-[employer] and used their services on yesterday to get to appt. He voices everything went well. He has already been set up with transportation to get to appt next week. He denies any other RN CM needs or concerns at this time.     Care Coordination Interventions:  Yes, provided   Follow up plan: No further intervention required.   Encounter Outcome:  Pt. Visit Completed   Alessandra Grout [facility] Care Management Care Management Community Coordinator Direct Phone: [phone] Toll Free: [phone] Fax: [phone]

## 2022-04-27 DIAGNOSIS — Z466 Encounter for fitting and adjustment of urinary device: Secondary | ICD-10-CM | POA: Diagnosis not present

## 2022-04-27 DIAGNOSIS — K219 Gastro-esophageal reflux disease without esophagitis: Secondary | ICD-10-CM | POA: Diagnosis not present

## 2022-04-27 DIAGNOSIS — I2510 Atherosclerotic heart disease of native coronary artery without angina pectoris: Secondary | ICD-10-CM | POA: Diagnosis not present

## 2022-04-27 DIAGNOSIS — N138 Other obstructive and reflux uropathy: Secondary | ICD-10-CM | POA: Diagnosis not present

## 2022-04-27 DIAGNOSIS — B954 Other streptococcus as the cause of diseases classified elsewhere: Secondary | ICD-10-CM | POA: Diagnosis not present

## 2022-04-27 DIAGNOSIS — E785 Hyperlipidemia, unspecified: Secondary | ICD-10-CM | POA: Diagnosis not present

## 2022-04-27 DIAGNOSIS — N12 Tubulo-interstitial nephritis, not specified as acute or chronic: Secondary | ICD-10-CM | POA: Diagnosis not present

## 2022-04-27 DIAGNOSIS — E1122 Type 2 diabetes mellitus with diabetic chronic kidney disease: Secondary | ICD-10-CM | POA: Diagnosis not present

## 2022-04-27 DIAGNOSIS — I5032 Chronic diastolic (congestive) heart failure: Secondary | ICD-10-CM | POA: Diagnosis not present

## 2022-04-27 DIAGNOSIS — I69322 Dysarthria following cerebral infarction: Secondary | ICD-10-CM | POA: Diagnosis not present

## 2022-04-27 DIAGNOSIS — I130 Hypertensive heart and chronic kidney disease with heart failure and stage 1 through stage 4 chronic kidney disease, or unspecified chronic kidney disease: Secondary | ICD-10-CM | POA: Diagnosis not present

## 2022-04-27 DIAGNOSIS — N1832 Chronic kidney disease, stage 3b: Secondary | ICD-10-CM | POA: Diagnosis not present

## 2022-04-27 DIAGNOSIS — Z7901 Long term (current) use of anticoagulants: Secondary | ICD-10-CM | POA: Diagnosis not present

## 2022-04-27 DIAGNOSIS — I69351 Hemiplegia and hemiparesis following cerebral infarction affecting right dominant side: Secondary | ICD-10-CM | POA: Diagnosis not present

## 2022-04-27 DIAGNOSIS — N179 Acute kidney failure, unspecified: Secondary | ICD-10-CM | POA: Diagnosis not present

## 2022-04-27 DIAGNOSIS — I480 Paroxysmal atrial fibrillation: Secondary | ICD-10-CM | POA: Diagnosis not present

## 2022-04-27 NOTE — Progress Notes (Signed)
CBC and BMP stable.

## 2022-04-28 DIAGNOSIS — I480 Paroxysmal atrial fibrillation: Secondary | ICD-10-CM | POA: Diagnosis not present

## 2022-04-28 DIAGNOSIS — N1832 Chronic kidney disease, stage 3b: Secondary | ICD-10-CM | POA: Diagnosis not present

## 2022-04-28 DIAGNOSIS — N138 Other obstructive and reflux uropathy: Secondary | ICD-10-CM | POA: Diagnosis not present

## 2022-04-28 DIAGNOSIS — I5032 Chronic diastolic (congestive) heart failure: Secondary | ICD-10-CM | POA: Diagnosis not present

## 2022-04-28 DIAGNOSIS — I130 Hypertensive heart and chronic kidney disease with heart failure and stage 1 through stage 4 chronic kidney disease, or unspecified chronic kidney disease: Secondary | ICD-10-CM | POA: Diagnosis not present

## 2022-04-28 DIAGNOSIS — I69322 Dysarthria following cerebral infarction: Secondary | ICD-10-CM | POA: Diagnosis not present

## 2022-04-28 DIAGNOSIS — N179 Acute kidney failure, unspecified: Secondary | ICD-10-CM | POA: Diagnosis not present

## 2022-04-28 DIAGNOSIS — Z7901 Long term (current) use of anticoagulants: Secondary | ICD-10-CM | POA: Diagnosis not present

## 2022-04-28 DIAGNOSIS — B954 Other streptococcus as the cause of diseases classified elsewhere: Secondary | ICD-10-CM | POA: Diagnosis not present

## 2022-04-28 DIAGNOSIS — Z466 Encounter for fitting and adjustment of urinary device: Secondary | ICD-10-CM | POA: Diagnosis not present

## 2022-04-28 DIAGNOSIS — E785 Hyperlipidemia, unspecified: Secondary | ICD-10-CM | POA: Diagnosis not present

## 2022-04-28 DIAGNOSIS — N12 Tubulo-interstitial nephritis, not specified as acute or chronic: Secondary | ICD-10-CM | POA: Diagnosis not present

## 2022-04-28 DIAGNOSIS — E1122 Type 2 diabetes mellitus with diabetic chronic kidney disease: Secondary | ICD-10-CM | POA: Diagnosis not present

## 2022-04-28 DIAGNOSIS — I2510 Atherosclerotic heart disease of native coronary artery without angina pectoris: Secondary | ICD-10-CM | POA: Diagnosis not present

## 2022-04-28 DIAGNOSIS — K219 Gastro-esophageal reflux disease without esophagitis: Secondary | ICD-10-CM | POA: Diagnosis not present

## 2022-04-28 DIAGNOSIS — I69351 Hemiplegia and hemiparesis following cerebral infarction affecting right dominant side: Secondary | ICD-10-CM | POA: Diagnosis not present

## 2022-04-30 DIAGNOSIS — I69351 Hemiplegia and hemiparesis following cerebral infarction affecting right dominant side: Secondary | ICD-10-CM | POA: Diagnosis not present

## 2022-04-30 DIAGNOSIS — I480 Paroxysmal atrial fibrillation: Secondary | ICD-10-CM | POA: Diagnosis not present

## 2022-04-30 DIAGNOSIS — N138 Other obstructive and reflux uropathy: Secondary | ICD-10-CM | POA: Diagnosis not present

## 2022-04-30 DIAGNOSIS — B954 Other streptococcus as the cause of diseases classified elsewhere: Secondary | ICD-10-CM | POA: Diagnosis not present

## 2022-04-30 DIAGNOSIS — E785 Hyperlipidemia, unspecified: Secondary | ICD-10-CM | POA: Diagnosis not present

## 2022-04-30 DIAGNOSIS — I5032 Chronic diastolic (congestive) heart failure: Secondary | ICD-10-CM | POA: Diagnosis not present

## 2022-04-30 DIAGNOSIS — K219 Gastro-esophageal reflux disease without esophagitis: Secondary | ICD-10-CM | POA: Diagnosis not present

## 2022-04-30 DIAGNOSIS — E1122 Type 2 diabetes mellitus with diabetic chronic kidney disease: Secondary | ICD-10-CM | POA: Diagnosis not present

## 2022-04-30 DIAGNOSIS — I130 Hypertensive heart and chronic kidney disease with heart failure and stage 1 through stage 4 chronic kidney disease, or unspecified chronic kidney disease: Secondary | ICD-10-CM | POA: Diagnosis not present

## 2022-04-30 DIAGNOSIS — Z7901 Long term (current) use of anticoagulants: Secondary | ICD-10-CM | POA: Diagnosis not present

## 2022-04-30 DIAGNOSIS — I2510 Atherosclerotic heart disease of native coronary artery without angina pectoris: Secondary | ICD-10-CM | POA: Diagnosis not present

## 2022-04-30 DIAGNOSIS — N179 Acute kidney failure, unspecified: Secondary | ICD-10-CM | POA: Diagnosis not present

## 2022-04-30 DIAGNOSIS — I69322 Dysarthria following cerebral infarction: Secondary | ICD-10-CM | POA: Diagnosis not present

## 2022-04-30 DIAGNOSIS — Z466 Encounter for fitting and adjustment of urinary device: Secondary | ICD-10-CM | POA: Diagnosis not present

## 2022-04-30 DIAGNOSIS — N1832 Chronic kidney disease, stage 3b: Secondary | ICD-10-CM | POA: Diagnosis not present

## 2022-04-30 DIAGNOSIS — N12 Tubulo-interstitial nephritis, not specified as acute or chronic: Secondary | ICD-10-CM | POA: Diagnosis not present

## 2022-05-01 ENCOUNTER — Telehealth: Payer: Medicare Other

## 2022-05-01 DIAGNOSIS — R338 Other retention of urine: Secondary | ICD-10-CM | POA: Diagnosis not present

## 2022-05-01 NOTE — Telephone Encounter (Signed)
Patient notified and he stated he will try to get something over the counter and if no better will call the office back

## 2022-05-01 NOTE — Telephone Encounter (Signed)
Please schedule appointment for evaluation or if pain worst will need to be evaluated in urgent care or ED.

## 2022-05-01 NOTE — Telephone Encounter (Signed)
Patient called stating that he is having trouble sitting down. Patient states that he is having pain in the rectal area. He has no trouble with bowel movement.Please advise.  Message routed to Richarda Blade, NP (covering provider)

## 2022-05-04 DIAGNOSIS — Z466 Encounter for fitting and adjustment of urinary device: Secondary | ICD-10-CM | POA: Diagnosis not present

## 2022-05-04 DIAGNOSIS — I69351 Hemiplegia and hemiparesis following cerebral infarction affecting right dominant side: Secondary | ICD-10-CM | POA: Diagnosis not present

## 2022-05-04 DIAGNOSIS — B954 Other streptococcus as the cause of diseases classified elsewhere: Secondary | ICD-10-CM | POA: Diagnosis not present

## 2022-05-04 DIAGNOSIS — N179 Acute kidney failure, unspecified: Secondary | ICD-10-CM | POA: Diagnosis not present

## 2022-05-04 DIAGNOSIS — N1832 Chronic kidney disease, stage 3b: Secondary | ICD-10-CM | POA: Diagnosis not present

## 2022-05-04 DIAGNOSIS — I2510 Atherosclerotic heart disease of native coronary artery without angina pectoris: Secondary | ICD-10-CM | POA: Diagnosis not present

## 2022-05-04 DIAGNOSIS — I5032 Chronic diastolic (congestive) heart failure: Secondary | ICD-10-CM | POA: Diagnosis not present

## 2022-05-04 DIAGNOSIS — I480 Paroxysmal atrial fibrillation: Secondary | ICD-10-CM | POA: Diagnosis not present

## 2022-05-04 DIAGNOSIS — I69322 Dysarthria following cerebral infarction: Secondary | ICD-10-CM | POA: Diagnosis not present

## 2022-05-04 DIAGNOSIS — N12 Tubulo-interstitial nephritis, not specified as acute or chronic: Secondary | ICD-10-CM | POA: Diagnosis not present

## 2022-05-04 DIAGNOSIS — E1122 Type 2 diabetes mellitus with diabetic chronic kidney disease: Secondary | ICD-10-CM | POA: Diagnosis not present

## 2022-05-04 DIAGNOSIS — E785 Hyperlipidemia, unspecified: Secondary | ICD-10-CM | POA: Diagnosis not present

## 2022-05-04 DIAGNOSIS — Z7901 Long term (current) use of anticoagulants: Secondary | ICD-10-CM | POA: Diagnosis not present

## 2022-05-04 DIAGNOSIS — I130 Hypertensive heart and chronic kidney disease with heart failure and stage 1 through stage 4 chronic kidney disease, or unspecified chronic kidney disease: Secondary | ICD-10-CM | POA: Diagnosis not present

## 2022-05-04 DIAGNOSIS — N138 Other obstructive and reflux uropathy: Secondary | ICD-10-CM | POA: Diagnosis not present

## 2022-05-04 DIAGNOSIS — K219 Gastro-esophageal reflux disease without esophagitis: Secondary | ICD-10-CM | POA: Diagnosis not present

## 2022-05-05 DIAGNOSIS — I2510 Atherosclerotic heart disease of native coronary artery without angina pectoris: Secondary | ICD-10-CM | POA: Diagnosis not present

## 2022-05-05 DIAGNOSIS — I69351 Hemiplegia and hemiparesis following cerebral infarction affecting right dominant side: Secondary | ICD-10-CM | POA: Diagnosis not present

## 2022-05-05 DIAGNOSIS — N179 Acute kidney failure, unspecified: Secondary | ICD-10-CM | POA: Diagnosis not present

## 2022-05-05 DIAGNOSIS — N138 Other obstructive and reflux uropathy: Secondary | ICD-10-CM | POA: Diagnosis not present

## 2022-05-05 DIAGNOSIS — I130 Hypertensive heart and chronic kidney disease with heart failure and stage 1 through stage 4 chronic kidney disease, or unspecified chronic kidney disease: Secondary | ICD-10-CM | POA: Diagnosis not present

## 2022-05-05 DIAGNOSIS — N1832 Chronic kidney disease, stage 3b: Secondary | ICD-10-CM | POA: Diagnosis not present

## 2022-05-05 DIAGNOSIS — B954 Other streptococcus as the cause of diseases classified elsewhere: Secondary | ICD-10-CM | POA: Diagnosis not present

## 2022-05-05 DIAGNOSIS — N12 Tubulo-interstitial nephritis, not specified as acute or chronic: Secondary | ICD-10-CM | POA: Diagnosis not present

## 2022-05-05 DIAGNOSIS — K219 Gastro-esophageal reflux disease without esophagitis: Secondary | ICD-10-CM | POA: Diagnosis not present

## 2022-05-05 DIAGNOSIS — I5032 Chronic diastolic (congestive) heart failure: Secondary | ICD-10-CM | POA: Diagnosis not present

## 2022-05-05 DIAGNOSIS — I480 Paroxysmal atrial fibrillation: Secondary | ICD-10-CM | POA: Diagnosis not present

## 2022-05-05 DIAGNOSIS — E785 Hyperlipidemia, unspecified: Secondary | ICD-10-CM | POA: Diagnosis not present

## 2022-05-05 DIAGNOSIS — I69322 Dysarthria following cerebral infarction: Secondary | ICD-10-CM | POA: Diagnosis not present

## 2022-05-05 DIAGNOSIS — E1122 Type 2 diabetes mellitus with diabetic chronic kidney disease: Secondary | ICD-10-CM | POA: Diagnosis not present

## 2022-05-05 DIAGNOSIS — Z466 Encounter for fitting and adjustment of urinary device: Secondary | ICD-10-CM | POA: Diagnosis not present

## 2022-05-05 DIAGNOSIS — Z7901 Long term (current) use of anticoagulants: Secondary | ICD-10-CM | POA: Diagnosis not present

## 2022-05-06 DIAGNOSIS — I480 Paroxysmal atrial fibrillation: Secondary | ICD-10-CM | POA: Diagnosis not present

## 2022-05-06 DIAGNOSIS — N138 Other obstructive and reflux uropathy: Secondary | ICD-10-CM | POA: Diagnosis not present

## 2022-05-06 DIAGNOSIS — I69351 Hemiplegia and hemiparesis following cerebral infarction affecting right dominant side: Secondary | ICD-10-CM | POA: Diagnosis not present

## 2022-05-06 DIAGNOSIS — I5032 Chronic diastolic (congestive) heart failure: Secondary | ICD-10-CM | POA: Diagnosis not present

## 2022-05-06 DIAGNOSIS — N179 Acute kidney failure, unspecified: Secondary | ICD-10-CM | POA: Diagnosis not present

## 2022-05-06 DIAGNOSIS — I2510 Atherosclerotic heart disease of native coronary artery without angina pectoris: Secondary | ICD-10-CM | POA: Diagnosis not present

## 2022-05-06 DIAGNOSIS — E785 Hyperlipidemia, unspecified: Secondary | ICD-10-CM | POA: Diagnosis not present

## 2022-05-06 DIAGNOSIS — Z7901 Long term (current) use of anticoagulants: Secondary | ICD-10-CM | POA: Diagnosis not present

## 2022-05-06 DIAGNOSIS — B954 Other streptococcus as the cause of diseases classified elsewhere: Secondary | ICD-10-CM | POA: Diagnosis not present

## 2022-05-06 DIAGNOSIS — N12 Tubulo-interstitial nephritis, not specified as acute or chronic: Secondary | ICD-10-CM | POA: Diagnosis not present

## 2022-05-06 DIAGNOSIS — E1122 Type 2 diabetes mellitus with diabetic chronic kidney disease: Secondary | ICD-10-CM | POA: Diagnosis not present

## 2022-05-06 DIAGNOSIS — K219 Gastro-esophageal reflux disease without esophagitis: Secondary | ICD-10-CM | POA: Diagnosis not present

## 2022-05-06 DIAGNOSIS — I130 Hypertensive heart and chronic kidney disease with heart failure and stage 1 through stage 4 chronic kidney disease, or unspecified chronic kidney disease: Secondary | ICD-10-CM | POA: Diagnosis not present

## 2022-05-06 DIAGNOSIS — I69322 Dysarthria following cerebral infarction: Secondary | ICD-10-CM | POA: Diagnosis not present

## 2022-05-06 DIAGNOSIS — N1832 Chronic kidney disease, stage 3b: Secondary | ICD-10-CM | POA: Diagnosis not present

## 2022-05-06 DIAGNOSIS — Z466 Encounter for fitting and adjustment of urinary device: Secondary | ICD-10-CM | POA: Diagnosis not present

## 2022-05-11 DIAGNOSIS — I480 Paroxysmal atrial fibrillation: Secondary | ICD-10-CM | POA: Diagnosis not present

## 2022-05-11 DIAGNOSIS — I69351 Hemiplegia and hemiparesis following cerebral infarction affecting right dominant side: Secondary | ICD-10-CM | POA: Diagnosis not present

## 2022-05-11 DIAGNOSIS — I69322 Dysarthria following cerebral infarction: Secondary | ICD-10-CM | POA: Diagnosis not present

## 2022-05-11 DIAGNOSIS — N138 Other obstructive and reflux uropathy: Secondary | ICD-10-CM | POA: Diagnosis not present

## 2022-05-11 DIAGNOSIS — E1122 Type 2 diabetes mellitus with diabetic chronic kidney disease: Secondary | ICD-10-CM | POA: Diagnosis not present

## 2022-05-11 DIAGNOSIS — I130 Hypertensive heart and chronic kidney disease with heart failure and stage 1 through stage 4 chronic kidney disease, or unspecified chronic kidney disease: Secondary | ICD-10-CM | POA: Diagnosis not present

## 2022-05-11 DIAGNOSIS — N1832 Chronic kidney disease, stage 3b: Secondary | ICD-10-CM | POA: Diagnosis not present

## 2022-05-11 DIAGNOSIS — Z7901 Long term (current) use of anticoagulants: Secondary | ICD-10-CM | POA: Diagnosis not present

## 2022-05-11 DIAGNOSIS — I5032 Chronic diastolic (congestive) heart failure: Secondary | ICD-10-CM | POA: Diagnosis not present

## 2022-05-11 DIAGNOSIS — N12 Tubulo-interstitial nephritis, not specified as acute or chronic: Secondary | ICD-10-CM | POA: Diagnosis not present

## 2022-05-11 DIAGNOSIS — I2510 Atherosclerotic heart disease of native coronary artery without angina pectoris: Secondary | ICD-10-CM | POA: Diagnosis not present

## 2022-05-11 DIAGNOSIS — E785 Hyperlipidemia, unspecified: Secondary | ICD-10-CM | POA: Diagnosis not present

## 2022-05-11 DIAGNOSIS — K219 Gastro-esophageal reflux disease without esophagitis: Secondary | ICD-10-CM | POA: Diagnosis not present

## 2022-05-11 DIAGNOSIS — Z466 Encounter for fitting and adjustment of urinary device: Secondary | ICD-10-CM | POA: Diagnosis not present

## 2022-05-11 DIAGNOSIS — B954 Other streptococcus as the cause of diseases classified elsewhere: Secondary | ICD-10-CM | POA: Diagnosis not present

## 2022-05-11 DIAGNOSIS — N179 Acute kidney failure, unspecified: Secondary | ICD-10-CM | POA: Diagnosis not present

## 2022-05-12 ENCOUNTER — Other Ambulatory Visit: Payer: Self-pay

## 2022-05-12 DIAGNOSIS — I482 Chronic atrial fibrillation, unspecified: Secondary | ICD-10-CM

## 2022-05-12 MED ORDER — DILTIAZEM HCL ER COATED BEADS 180 MG PO CP24
ORAL_CAPSULE | ORAL | 2 refills | Status: DC
Start: 2022-05-12 — End: 2023-03-02

## 2022-05-13 ENCOUNTER — Other Ambulatory Visit: Payer: Self-pay

## 2022-05-13 MED ORDER — SITAGLIPTIN PHOSPHATE 50 MG PO TABS: 50.0000 mg | ORAL_TABLET | Freq: Every day | ORAL | 1 refills | Status: DC

## 2022-05-13 NOTE — Telephone Encounter (Signed)
Refill request received from pharmacy °

## 2022-05-18 DIAGNOSIS — Z9181 History of falling: Secondary | ICD-10-CM | POA: Diagnosis not present

## 2022-05-18 DIAGNOSIS — Z7901 Long term (current) use of anticoagulants: Secondary | ICD-10-CM | POA: Diagnosis not present

## 2022-05-18 DIAGNOSIS — N1832 Chronic kidney disease, stage 3b: Secondary | ICD-10-CM | POA: Diagnosis not present

## 2022-05-18 DIAGNOSIS — B954 Other streptococcus as the cause of diseases classified elsewhere: Secondary | ICD-10-CM | POA: Diagnosis not present

## 2022-05-18 DIAGNOSIS — Z466 Encounter for fitting and adjustment of urinary device: Secondary | ICD-10-CM | POA: Diagnosis not present

## 2022-05-18 DIAGNOSIS — I69351 Hemiplegia and hemiparesis following cerebral infarction affecting right dominant side: Secondary | ICD-10-CM | POA: Diagnosis not present

## 2022-05-18 DIAGNOSIS — N179 Acute kidney failure, unspecified: Secondary | ICD-10-CM | POA: Diagnosis not present

## 2022-05-18 DIAGNOSIS — N138 Other obstructive and reflux uropathy: Secondary | ICD-10-CM | POA: Diagnosis not present

## 2022-05-18 DIAGNOSIS — N12 Tubulo-interstitial nephritis, not specified as acute or chronic: Secondary | ICD-10-CM | POA: Diagnosis not present

## 2022-05-18 DIAGNOSIS — I69322 Dysarthria following cerebral infarction: Secondary | ICD-10-CM | POA: Diagnosis not present

## 2022-05-18 DIAGNOSIS — K219 Gastro-esophageal reflux disease without esophagitis: Secondary | ICD-10-CM | POA: Diagnosis not present

## 2022-05-18 DIAGNOSIS — E785 Hyperlipidemia, unspecified: Secondary | ICD-10-CM | POA: Diagnosis not present

## 2022-05-18 DIAGNOSIS — I480 Paroxysmal atrial fibrillation: Secondary | ICD-10-CM | POA: Diagnosis not present

## 2022-05-18 DIAGNOSIS — I5032 Chronic diastolic (congestive) heart failure: Secondary | ICD-10-CM | POA: Diagnosis not present

## 2022-05-18 DIAGNOSIS — Z7984 Long term (current) use of oral hypoglycemic drugs: Secondary | ICD-10-CM | POA: Diagnosis not present

## 2022-05-18 DIAGNOSIS — I2510 Atherosclerotic heart disease of native coronary artery without angina pectoris: Secondary | ICD-10-CM | POA: Diagnosis not present

## 2022-05-18 DIAGNOSIS — I130 Hypertensive heart and chronic kidney disease with heart failure and stage 1 through stage 4 chronic kidney disease, or unspecified chronic kidney disease: Secondary | ICD-10-CM | POA: Diagnosis not present

## 2022-05-18 DIAGNOSIS — E1122 Type 2 diabetes mellitus with diabetic chronic kidney disease: Secondary | ICD-10-CM | POA: Diagnosis not present

## 2022-05-25 DIAGNOSIS — B954 Other streptococcus as the cause of diseases classified elsewhere: Secondary | ICD-10-CM | POA: Diagnosis not present

## 2022-05-25 DIAGNOSIS — I130 Hypertensive heart and chronic kidney disease with heart failure and stage 1 through stage 4 chronic kidney disease, or unspecified chronic kidney disease: Secondary | ICD-10-CM | POA: Diagnosis not present

## 2022-05-25 DIAGNOSIS — I2510 Atherosclerotic heart disease of native coronary artery without angina pectoris: Secondary | ICD-10-CM | POA: Diagnosis not present

## 2022-05-25 DIAGNOSIS — I480 Paroxysmal atrial fibrillation: Secondary | ICD-10-CM | POA: Diagnosis not present

## 2022-05-25 DIAGNOSIS — N1832 Chronic kidney disease, stage 3b: Secondary | ICD-10-CM | POA: Diagnosis not present

## 2022-05-25 DIAGNOSIS — E785 Hyperlipidemia, unspecified: Secondary | ICD-10-CM | POA: Diagnosis not present

## 2022-05-25 DIAGNOSIS — Z7901 Long term (current) use of anticoagulants: Secondary | ICD-10-CM | POA: Diagnosis not present

## 2022-05-25 DIAGNOSIS — N138 Other obstructive and reflux uropathy: Secondary | ICD-10-CM | POA: Diagnosis not present

## 2022-05-25 DIAGNOSIS — K219 Gastro-esophageal reflux disease without esophagitis: Secondary | ICD-10-CM | POA: Diagnosis not present

## 2022-05-25 DIAGNOSIS — E1122 Type 2 diabetes mellitus with diabetic chronic kidney disease: Secondary | ICD-10-CM | POA: Diagnosis not present

## 2022-05-25 DIAGNOSIS — N12 Tubulo-interstitial nephritis, not specified as acute or chronic: Secondary | ICD-10-CM | POA: Diagnosis not present

## 2022-05-25 DIAGNOSIS — I69351 Hemiplegia and hemiparesis following cerebral infarction affecting right dominant side: Secondary | ICD-10-CM | POA: Diagnosis not present

## 2022-05-25 DIAGNOSIS — Z466 Encounter for fitting and adjustment of urinary device: Secondary | ICD-10-CM | POA: Diagnosis not present

## 2022-05-25 DIAGNOSIS — I5032 Chronic diastolic (congestive) heart failure: Secondary | ICD-10-CM | POA: Diagnosis not present

## 2022-05-25 DIAGNOSIS — I69322 Dysarthria following cerebral infarction: Secondary | ICD-10-CM | POA: Diagnosis not present

## 2022-05-25 DIAGNOSIS — N179 Acute kidney failure, unspecified: Secondary | ICD-10-CM | POA: Diagnosis not present

## 2022-05-29 DIAGNOSIS — N12 Tubulo-interstitial nephritis, not specified as acute or chronic: Secondary | ICD-10-CM | POA: Diagnosis not present

## 2022-05-29 DIAGNOSIS — N138 Other obstructive and reflux uropathy: Secondary | ICD-10-CM | POA: Diagnosis not present

## 2022-05-29 DIAGNOSIS — I5032 Chronic diastolic (congestive) heart failure: Secondary | ICD-10-CM | POA: Diagnosis not present

## 2022-05-29 DIAGNOSIS — N1832 Chronic kidney disease, stage 3b: Secondary | ICD-10-CM | POA: Diagnosis not present

## 2022-05-29 DIAGNOSIS — E1122 Type 2 diabetes mellitus with diabetic chronic kidney disease: Secondary | ICD-10-CM | POA: Diagnosis not present

## 2022-05-29 DIAGNOSIS — I69322 Dysarthria following cerebral infarction: Secondary | ICD-10-CM | POA: Diagnosis not present

## 2022-05-29 DIAGNOSIS — K219 Gastro-esophageal reflux disease without esophagitis: Secondary | ICD-10-CM | POA: Diagnosis not present

## 2022-05-29 DIAGNOSIS — I69351 Hemiplegia and hemiparesis following cerebral infarction affecting right dominant side: Secondary | ICD-10-CM | POA: Diagnosis not present

## 2022-05-29 DIAGNOSIS — N179 Acute kidney failure, unspecified: Secondary | ICD-10-CM | POA: Diagnosis not present

## 2022-05-29 DIAGNOSIS — B954 Other streptococcus as the cause of diseases classified elsewhere: Secondary | ICD-10-CM | POA: Diagnosis not present

## 2022-05-29 DIAGNOSIS — Z466 Encounter for fitting and adjustment of urinary device: Secondary | ICD-10-CM | POA: Diagnosis not present

## 2022-05-29 DIAGNOSIS — I480 Paroxysmal atrial fibrillation: Secondary | ICD-10-CM | POA: Diagnosis not present

## 2022-05-29 DIAGNOSIS — I2510 Atherosclerotic heart disease of native coronary artery without angina pectoris: Secondary | ICD-10-CM | POA: Diagnosis not present

## 2022-05-29 DIAGNOSIS — Z7901 Long term (current) use of anticoagulants: Secondary | ICD-10-CM | POA: Diagnosis not present

## 2022-05-29 DIAGNOSIS — I130 Hypertensive heart and chronic kidney disease with heart failure and stage 1 through stage 4 chronic kidney disease, or unspecified chronic kidney disease: Secondary | ICD-10-CM | POA: Diagnosis not present

## 2022-05-29 DIAGNOSIS — E785 Hyperlipidemia, unspecified: Secondary | ICD-10-CM | POA: Diagnosis not present

## 2022-06-01 DIAGNOSIS — R338 Other retention of urine: Secondary | ICD-10-CM | POA: Diagnosis not present

## 2022-06-02 DIAGNOSIS — Z7901 Long term (current) use of anticoagulants: Secondary | ICD-10-CM | POA: Diagnosis not present

## 2022-06-02 DIAGNOSIS — I69322 Dysarthria following cerebral infarction: Secondary | ICD-10-CM | POA: Diagnosis not present

## 2022-06-02 DIAGNOSIS — I2510 Atherosclerotic heart disease of native coronary artery without angina pectoris: Secondary | ICD-10-CM | POA: Diagnosis not present

## 2022-06-02 DIAGNOSIS — E785 Hyperlipidemia, unspecified: Secondary | ICD-10-CM | POA: Diagnosis not present

## 2022-06-02 DIAGNOSIS — N1832 Chronic kidney disease, stage 3b: Secondary | ICD-10-CM | POA: Diagnosis not present

## 2022-06-02 DIAGNOSIS — B954 Other streptococcus as the cause of diseases classified elsewhere: Secondary | ICD-10-CM | POA: Diagnosis not present

## 2022-06-02 DIAGNOSIS — N138 Other obstructive and reflux uropathy: Secondary | ICD-10-CM | POA: Diagnosis not present

## 2022-06-02 DIAGNOSIS — N12 Tubulo-interstitial nephritis, not specified as acute or chronic: Secondary | ICD-10-CM | POA: Diagnosis not present

## 2022-06-02 DIAGNOSIS — I480 Paroxysmal atrial fibrillation: Secondary | ICD-10-CM | POA: Diagnosis not present

## 2022-06-02 DIAGNOSIS — N179 Acute kidney failure, unspecified: Secondary | ICD-10-CM | POA: Diagnosis not present

## 2022-06-02 DIAGNOSIS — I69351 Hemiplegia and hemiparesis following cerebral infarction affecting right dominant side: Secondary | ICD-10-CM | POA: Diagnosis not present

## 2022-06-02 DIAGNOSIS — K219 Gastro-esophageal reflux disease without esophagitis: Secondary | ICD-10-CM | POA: Diagnosis not present

## 2022-06-02 DIAGNOSIS — I5032 Chronic diastolic (congestive) heart failure: Secondary | ICD-10-CM | POA: Diagnosis not present

## 2022-06-02 DIAGNOSIS — I130 Hypertensive heart and chronic kidney disease with heart failure and stage 1 through stage 4 chronic kidney disease, or unspecified chronic kidney disease: Secondary | ICD-10-CM | POA: Diagnosis not present

## 2022-06-02 DIAGNOSIS — Z466 Encounter for fitting and adjustment of urinary device: Secondary | ICD-10-CM | POA: Diagnosis not present

## 2022-06-02 DIAGNOSIS — E1122 Type 2 diabetes mellitus with diabetic chronic kidney disease: Secondary | ICD-10-CM | POA: Diagnosis not present

## 2022-06-05 DIAGNOSIS — N138 Other obstructive and reflux uropathy: Secondary | ICD-10-CM | POA: Diagnosis not present

## 2022-06-05 DIAGNOSIS — N12 Tubulo-interstitial nephritis, not specified as acute or chronic: Secondary | ICD-10-CM | POA: Diagnosis not present

## 2022-06-05 DIAGNOSIS — I2510 Atherosclerotic heart disease of native coronary artery without angina pectoris: Secondary | ICD-10-CM | POA: Diagnosis not present

## 2022-06-05 DIAGNOSIS — I69322 Dysarthria following cerebral infarction: Secondary | ICD-10-CM | POA: Diagnosis not present

## 2022-06-05 DIAGNOSIS — Z466 Encounter for fitting and adjustment of urinary device: Secondary | ICD-10-CM | POA: Diagnosis not present

## 2022-06-05 DIAGNOSIS — Z7901 Long term (current) use of anticoagulants: Secondary | ICD-10-CM | POA: Diagnosis not present

## 2022-06-05 DIAGNOSIS — E1122 Type 2 diabetes mellitus with diabetic chronic kidney disease: Secondary | ICD-10-CM | POA: Diagnosis not present

## 2022-06-05 DIAGNOSIS — E785 Hyperlipidemia, unspecified: Secondary | ICD-10-CM | POA: Diagnosis not present

## 2022-06-05 DIAGNOSIS — N1832 Chronic kidney disease, stage 3b: Secondary | ICD-10-CM | POA: Diagnosis not present

## 2022-06-05 DIAGNOSIS — I69351 Hemiplegia and hemiparesis following cerebral infarction affecting right dominant side: Secondary | ICD-10-CM | POA: Diagnosis not present

## 2022-06-05 DIAGNOSIS — I480 Paroxysmal atrial fibrillation: Secondary | ICD-10-CM | POA: Diagnosis not present

## 2022-06-05 DIAGNOSIS — I5032 Chronic diastolic (congestive) heart failure: Secondary | ICD-10-CM | POA: Diagnosis not present

## 2022-06-05 DIAGNOSIS — K219 Gastro-esophageal reflux disease without esophagitis: Secondary | ICD-10-CM | POA: Diagnosis not present

## 2022-06-05 DIAGNOSIS — B954 Other streptococcus as the cause of diseases classified elsewhere: Secondary | ICD-10-CM | POA: Diagnosis not present

## 2022-06-05 DIAGNOSIS — I130 Hypertensive heart and chronic kidney disease with heart failure and stage 1 through stage 4 chronic kidney disease, or unspecified chronic kidney disease: Secondary | ICD-10-CM | POA: Diagnosis not present

## 2022-06-05 DIAGNOSIS — N179 Acute kidney failure, unspecified: Secondary | ICD-10-CM | POA: Diagnosis not present

## 2022-06-10 DIAGNOSIS — N12 Tubulo-interstitial nephritis, not specified as acute or chronic: Secondary | ICD-10-CM | POA: Diagnosis not present

## 2022-06-10 DIAGNOSIS — B954 Other streptococcus as the cause of diseases classified elsewhere: Secondary | ICD-10-CM | POA: Diagnosis not present

## 2022-06-10 DIAGNOSIS — N179 Acute kidney failure, unspecified: Secondary | ICD-10-CM | POA: Diagnosis not present

## 2022-06-10 DIAGNOSIS — I69322 Dysarthria following cerebral infarction: Secondary | ICD-10-CM | POA: Diagnosis not present

## 2022-06-10 DIAGNOSIS — Z466 Encounter for fitting and adjustment of urinary device: Secondary | ICD-10-CM | POA: Diagnosis not present

## 2022-06-10 DIAGNOSIS — I130 Hypertensive heart and chronic kidney disease with heart failure and stage 1 through stage 4 chronic kidney disease, or unspecified chronic kidney disease: Secondary | ICD-10-CM | POA: Diagnosis not present

## 2022-06-10 DIAGNOSIS — Z7901 Long term (current) use of anticoagulants: Secondary | ICD-10-CM | POA: Diagnosis not present

## 2022-06-10 DIAGNOSIS — N138 Other obstructive and reflux uropathy: Secondary | ICD-10-CM | POA: Diagnosis not present

## 2022-06-10 DIAGNOSIS — I2510 Atherosclerotic heart disease of native coronary artery without angina pectoris: Secondary | ICD-10-CM | POA: Diagnosis not present

## 2022-06-10 DIAGNOSIS — E785 Hyperlipidemia, unspecified: Secondary | ICD-10-CM | POA: Diagnosis not present

## 2022-06-10 DIAGNOSIS — E1122 Type 2 diabetes mellitus with diabetic chronic kidney disease: Secondary | ICD-10-CM | POA: Diagnosis not present

## 2022-06-10 DIAGNOSIS — N1832 Chronic kidney disease, stage 3b: Secondary | ICD-10-CM | POA: Diagnosis not present

## 2022-06-10 DIAGNOSIS — I69351 Hemiplegia and hemiparesis following cerebral infarction affecting right dominant side: Secondary | ICD-10-CM | POA: Diagnosis not present

## 2022-06-10 DIAGNOSIS — I5032 Chronic diastolic (congestive) heart failure: Secondary | ICD-10-CM | POA: Diagnosis not present

## 2022-06-10 DIAGNOSIS — I480 Paroxysmal atrial fibrillation: Secondary | ICD-10-CM | POA: Diagnosis not present

## 2022-06-10 DIAGNOSIS — K219 Gastro-esophageal reflux disease without esophagitis: Secondary | ICD-10-CM | POA: Diagnosis not present

## 2022-06-15 DIAGNOSIS — N401 Enlarged prostate with lower urinary tract symptoms: Secondary | ICD-10-CM | POA: Diagnosis not present

## 2022-06-15 DIAGNOSIS — R338 Other retention of urine: Secondary | ICD-10-CM | POA: Diagnosis not present

## 2022-06-29 ENCOUNTER — Other Ambulatory Visit: Payer: Self-pay | Admitting: Urology

## 2022-06-29 DIAGNOSIS — R338 Other retention of urine: Secondary | ICD-10-CM

## 2022-07-09 ENCOUNTER — Ambulatory Visit (INDEPENDENT_AMBULATORY_CARE_PROVIDER_SITE_OTHER): Payer: Medicare Other | Admitting: Podiatry

## 2022-07-09 ENCOUNTER — Encounter: Payer: Self-pay | Admitting: Podiatry

## 2022-07-09 DIAGNOSIS — M79675 Pain in left toe(s): Secondary | ICD-10-CM

## 2022-07-09 DIAGNOSIS — Z794 Long term (current) use of insulin: Secondary | ICD-10-CM

## 2022-07-09 DIAGNOSIS — E0822 Diabetes mellitus due to underlying condition with diabetic chronic kidney disease: Secondary | ICD-10-CM

## 2022-07-09 DIAGNOSIS — N183 Chronic kidney disease, stage 3 unspecified: Secondary | ICD-10-CM

## 2022-07-09 DIAGNOSIS — M79674 Pain in right toe(s): Secondary | ICD-10-CM | POA: Diagnosis not present

## 2022-07-09 DIAGNOSIS — B351 Tinea unguium: Secondary | ICD-10-CM | POA: Diagnosis not present

## 2022-07-09 DIAGNOSIS — M2042 Other hammer toe(s) (acquired), left foot: Secondary | ICD-10-CM

## 2022-07-09 DIAGNOSIS — M2041 Other hammer toe(s) (acquired), right foot: Secondary | ICD-10-CM

## 2022-07-09 NOTE — Progress Notes (Signed)
Patient presents today to be casted for  diabetic shoes and insoles.  Patient was measured 1 pair of diabetic shoes and 3 pairs of foam casted diabetic insoles.  Ht 5' 11 Wt 204 Shoe size 11w Shoe type v820m  Treating physician is Cabin crew   Re-appointment for regularly scheduled diabetic foot care visits or if they should experience any trouble with the shoes or insoles.

## 2022-07-09 NOTE — Progress Notes (Signed)
This patient returns to my office for at risk foot care.  This patient requires this care by a professional since this patient will be at risk due to having CKD, and diabetes and CVA.  This patient is unable to cut nails himself since the patient cannot reach his nails.These nails are painful walking and wearing shoes.  This patient presents for at risk foot care today.  General Appearance  Alert, conversant and in no acute stress.  Vascular  Dorsalis pedis and posterior tibial  pulses are palpable  right foot.  Weak  DP and PT left due to CVA.  Capillary return is within normal limits  bilaterally. Temperature is within normal limits  bilaterally.  Venous stasis.  Neurologic  Senn-Weinstein monofilament wire test diminished bilaterally. Muscle power within normal limits bilaterally.  Nails Thick disfigured discolored nails with subungual debris  from hallux to fifth toes bilaterally. No evidence of bacterial infection or drainage bilaterally.  Orthopedic  No limitations of motion  feet .  No crepitus or effusions noted.  HAV  B/L.  Skin  normotropic skin with no porokeratosis noted bilaterally.  No signs of infections or ulcers noted.     Onychomycosis  Pain in right toes  Pain in left toes  Consent was obtained for treatment procedures.   Mechanical debridement of nails 1-5  bilaterally performed with a nail nipper.  Filed with dremel without incident.    Return office visit  3  months                   Told patient to return for periodic foot care and evaluation due to potential at risk complications.   Helane Gunther DPM

## 2022-07-13 ENCOUNTER — Ambulatory Visit
Admission: RE | Admit: 2022-07-13 | Discharge: 2022-07-13 | Disposition: A | Discharge status: Home / Self Care | Payer: Medicare Other | Source: Ambulatory Visit | Attending: Urology | Admitting: Urology

## 2022-07-13 ENCOUNTER — Other Ambulatory Visit: Payer: Medicare Other

## 2022-07-13 DIAGNOSIS — R338 Other retention of urine: Secondary | ICD-10-CM

## 2022-07-13 DIAGNOSIS — N401 Enlarged prostate with lower urinary tract symptoms: Secondary | ICD-10-CM | POA: Diagnosis not present

## 2022-07-13 HISTORY — PX: IR RADIOLOGIST EVAL & MGMT: IMG5224

## 2022-07-13 NOTE — H&P (Signed)
Reason for visit: BPH with LUTS. Foley catheter dependent   Care Team(s): Primary Care; Frederica Kuster, MD Geriatrics; Medina-Vargas, Margit Banda, NP  Urology; Winter,Christopher Clifton Custard. MD  History of Present Illness:  [name] is a 67 y.o. male comorbid w PMHx significant for HTN, CKD 3, CHF and prior CVA in 2012 w residual paresis. Relevant Hx of acute bladder outlet obstruction secondary to enlarged prostate with ER visit on 04/13/22. Foley catheter placed on that visit with 1200 mL urine return. Pt is known to Urology, Dr Liliane Shi, and has had prior prostate Bx for elevated PSA (Negative, 11/07/20). He was treated for UTI + pyelonephritis, and on follow up his voiding trials have been unsuccessful, despite medical management with tamsulosin and finasteride. He most recently underwent cystoscopy on 06/15/22 which was revealing for persistent urinary retention. ROS was positive for catheter-related irritation. He denies hematuria.   I used a International Prostatism Symptom Score* (IPSS) Score to quantify his symptoms : 14 points (Moderate symptoms)   *Gery Pray MJ, Melene Muller MP, et al. The American Urological Association symptom index for benign prostatic hyperplasia. The Measurement Committee of the American Urological Association. J Urol [id number]; 528:4132.  Review of Systems: A 12 point ROS discussed and pertinent positives are indicated in the HPI above.  All other systems are negative.   Past Medical History:  Diagnosis Date   A-fib (HCC) 11/19/2010   Acute exacerbation of congestive heart failure (HCC) 11/19/2010   Arthritis    CAD (coronary artery disease) 11/20/2010   CHF (congestive heart failure) (HCC)    CKD (chronic kidney disease) stage 3, GFR 30-59 ml/min (HCC) 05/04/2017   Coronary artery disease    Diabetes type 2, controlled (HCC) 11/19/2010   GERD (gastroesophageal reflux disease) 02/08/2015   Gout    Gout 11/20/2010   History of cerebrovascular  accident (CVA) with residual deficit 05/04/2017   History of CVA (cerebrovascular accident) 11/19/2010   HTN (hypertension) 07/01/2012   Hyperlipemia 11/21/2010   Hypertension    Hypertensive emergency 11/19/2010   ICH (intracerebral hemorrhage) (HCC) 12/22/2010   Physical deconditioning 12/22/2010   Pulmonary edema 11/19/2010   Respiratory failure (HCC) 11/19/2010   Shortness of breath    Stroke (HCC) 11/19/2010   Thyroiditis 11/20/2010    Past Surgical History:  Procedure Laterality Date   PEG PLACEMENT  12/03/2010   Procedure: PERCUTANEOUS ENDOSCOPIC GASTROSTOMY (PEG) PLACEMENT;  Surgeon: Hart Carwin, MD;  Location: [facility] ENDOSCOPY;  Service: Endoscopy;  Laterality: N/A;   TRACHEOSTOMY TUBE PLACEMENT  11/28/2010   Procedure: TRACHEOSTOMY;  Surgeon: Susy Frizzle, MD;  Location: [facility] OR;  Service: ENT;  Laterality: N/A;    Allergies: Patient has no known allergies.  Medications: Prior to Admission medications   Medication Sig Start Date End Date Taking? Authorizing Provider  acetaminophen (TYLENOL) 500 MG tablet Take 1,000 mg by mouth daily as needed for moderate pain, fever or headache.    [provider]  Alcohol Swabs (ALCOHOL PREP) 70 % PADS Use to test blood sugar daily. Dx: E11.21 09/11/21   Frederica Kuster, MD  apixaban (ELIQUIS) 5 MG TABS tablet Take 1 tablet (5 mg total) by mouth 2 (two) times daily. 03/30/22   Nahser, Deloris Ping, MD  blood glucose meter kit and supplies Dispense based on patient and insurance preference. Use up to four times daily as directed. (FOR ICD-10 E10.9, E11.9). 07/14/21   Frederica Kuster, MD  carvedilol (COREG) 25 MG tablet Take 1 tablet (  25 mg total) by mouth 2 (two) times daily with a meal. Needs an appointment before anymore future refills. 03/06/22   Frederica Kuster, MD  diltiazem (CARDIZEM CD) 180 MG 24 hr capsule TAKE 2 CAPSULES BY MOUTH  DAILY AT NOON 05/12/22   Swinyer, Zachary George, NP  empagliflozin (JARDIANCE) 10 MG TABS tablet  Take 1 tablet (10 mg total) by mouth daily. 02/02/22   Frederica Kuster, MD  finasteride (PROSCAR) 5 MG tablet Take 1 tablet (5 mg total) by mouth daily. 04/17/22   Ghimire, Werner Lean, MD  glucose blood (ONETOUCH ULTRA) test strip 1 each by Other route daily. Dx: E11.21 04/15/22   Frederica Kuster, MD  isosorbide mononitrate (IMDUR) 30 MG 24 hr tablet Take 1 tablet (30 mg total) by mouth daily. 04/15/22   Frederica Kuster, MD  losartan (COZAAR) 25 MG tablet Take 1 tablet (25 mg total) by mouth daily. 12/18/20   Nahser, Deloris Ping, MD  metFORMIN (GLUCOPHAGE) 850 MG tablet Take 1 tablet (850 mg total) by mouth daily with breakfast. 02/02/22   Frederica Kuster, MD  Multiple Vitamins-Minerals (MENS ONE DAILY PO) Take 1 tablet by mouth daily.    [provider]  Dola Argyle LANCETS 33G MISC Use as directed to test blood sugar once daily 12/06/17   Marcine Matar, MD  pantoprazole (PROTONIX) 40 MG tablet Take 1 tablet (40 mg total) by mouth daily. Patient taking differently: Take 40 mg by mouth daily with supper. 12/08/21   Frederica Kuster, MD  pravastatin (PRAVACHOL) 40 MG tablet Take 1 tablet (40 mg total) by mouth daily. Patient taking differently: Take 40 mg by mouth at bedtime. 02/02/22   Frederica Kuster, MD  sitaGLIPtin (JANUVIA) 50 MG tablet Take 1 tablet (50 mg total) by mouth daily. 05/13/22   Frederica Kuster, MD  spironolactone (ALDACTONE) 25 MG tablet TAKE 1 TABLET BY MOUTH DAILY Patient taking differently: Take 25 mg by mouth daily with lunch. 03/03/22   Frederica Kuster, MD  tamsulosin (FLOMAX) 0.4 MG CAPS capsule Take 0.4 mg by mouth at bedtime.    [provider]     Family History  Problem Relation Age of Onset   Diabetes Mother    Diabetes Brother    Colon cancer Neg Hx    Colon polyps Neg Hx    Kidney disease Neg Hx    Esophageal cancer Neg Hx    Heart disease Neg Hx    Gallbladder disease Neg Hx     Social History   Socioeconomic History   Marital  status: Single    Spouse name: Not on file   Number of children: 0   Years of education: Not on file   Highest education level: Not on file  Occupational History   Occupation: Disability  Tobacco Use   Smoking status: Former    Types: Cigarettes    Quit date: 12/19/2005    Years since quitting: 16.5   Smokeless tobacco: Never  Vaping Use   Vaping Use: Never used  Substance and Sexual Activity   Alcohol use: No    Alcohol/week: 11.0 standard drinks of alcohol    Types: 6 Glasses of wine, 5 Shots of liquor per week    Comment: quit in 2012   Drug use: No   Sexual activity: Not on file  Other Topics Concern   Not on file  Social History Narrative   Diet: No      Caffeine: Yes  Married, if yes what year: No      Do you live in a house, apartment, assisted living, condo, trailer, ect: House      Is it one or more stories: No      How many persons live in your home? One      Pets: No      Highest level or education completed: 4 year college      Current/Past profession: Business       Exercise: Yes                 Type and how often: Every other day         Living Will: Yes   DNR: No   POA/HPOA: Yes      Functional Status:   Do you have difficulty bathing or dressing yourself? No   Do you have difficulty preparing food or eating? No   Do you have difficulty managing your medications? No   Do you have difficulty managing your finances? No   Do you have difficulty affording your medications? No   Social Determinants of Health   Financial Resource Strain: Not on file  Food Insecurity: No Food Insecurity (04/20/2022)   Hunger Vital Sign    Worried About Running Out of Food in the Last Year: Never true    Ran Out of Food in the Last Year: Never true  Transportation Needs: Unmet Transportation Needs (04/22/2022)   PRAPARE - Administrator, Civil Service (Medical): Yes    Lack of Transportation (Non-Medical): Yes  Physical Activity: Not on file   Stress: Not on file  Social Connections: Not on file     Review of Systems As above  Vital Signs: BP 111/76 (BP Location: Left Arm)   Pulse 79   Temp 97.9 F (36.6 C) (Oral)   Resp 16   SpO2 98%   Physical Exam  General: WN, NAD  CV: RRR on monitor Pulm: normal work of breathing on RA Abd: S, ND, NT MSK: in a wheelchair Psych: Appropriate affect.    Imaging:  CT AP, 04/13/22 Independently reviewed demonstrating marked prostatomegaly Prostate measures 6.6 x 5.4 x 7.4 cm, with an estimated calculated volume of 131 mL     Labs:  CBC: Recent Labs    04/13/22 0756 04/14/22 0339 04/23/22 1458  WBC 12.2* 11.9* 8.0  HGB 13.9 13.8 13.2  HCT 42.5 40.5 40.0  PLT 210 226 415*    COAGS: No results for input(s): "INR", "APTT" in the last 8760 hours.  BMP: Recent Labs    04/13/22 1907 04/14/22 0339 04/15/22 0923 04/16/22 0802 04/23/22 1458  NA 131* 131* 137 139 137  K 4.4 4.3 3.7 3.8 3.9  CL 96* 100 102 108 99  CO2 21* 17* 21* 21* 26  GLUCOSE 271* 175* 172* 176* 106*  BUN 87* 78* 45* 33* 28*  CALCIUM 9.2 9.2 9.4 9.1 10.0  CREATININE 6.47* 5.20* 2.37* 1.62* 1.33  GFRNONAA 9* 11* 29* 46*  --     Assessment and Plan:  67 y/o M comorbid w PMHx significant for HTN, CKD 3, CHF and prior CVA in 2012 w residual paresis. Pt w acute bladder outlet obstruction secondary to enlarged prostate with ER visit on 04/13/22 and has been catheter-dependent since. Streptococcus UTI with pyelonephritis, Rx w Keflex.  Prostatomegaly (~130 mL). Failed voiding trials. PSA, last 5.7 (08/2020). Prostate Bx, Negative (11/07/20) IPSS Score Total, 14 points (Moderate symptoms) QoL, severely decreased, on tamsulosin  0.4 mg QD and finasteride 5 mg QD   Given the nature of the disease, and concern for general anesthesia given significant cardiac risk, I felt that the patient would benefit from Prostate Artery Embolization (PAE).    The procedure has been fully reviewed with the  patient/patient's authorized representative. The risks, benefits and alternatives of PAE were fully discussed including technique, potential complications, timeline of improvement, efficacy and durability. The patient/patient's authorized representative has consented to the procedure.     *CT AP 04/13/22 reviewed. Will order CTA Pelvis for procedural planning *OK to proceed to schedule PAE ASAP on mutual availability.  *Same day procedure, no overnight admission. Procedure to be performed at [facility] *pt requests Foley catheter exchange on procedure day *given Pt height, ~6 ft, will plan for trans-femoral access. *No AC hold, Eliquis. *Rocephin for pre op Abx *CBC / Coags / PSA to be drawn on procedure day   Thank you for this interesting consult.  I greatly enjoyed meeting Kia W Carranza and look forward to participating in their care.  A copy of this report was sent to the requesting provider on this date.  Electronically Signed:  Roanna Banning, MD [facility]   Pager. [phone] Clinic. [phone]  I spent a total of 45 Minutes in face to face in clinical consultation, greater than 50% of which was counseling/coordinating care for Mr Mahki Speno [facility] BPH and LUTS management

## 2022-07-21 ENCOUNTER — Other Ambulatory Visit: Payer: Self-pay | Admitting: Interventional Radiology

## 2022-07-21 DIAGNOSIS — R338 Other retention of urine: Secondary | ICD-10-CM

## 2022-07-27 DIAGNOSIS — R338 Other retention of urine: Secondary | ICD-10-CM | POA: Diagnosis not present

## 2022-07-29 ENCOUNTER — Ambulatory Visit
Admission: RE | Admit: 2022-07-29 | Discharge: 2022-07-29 | Disposition: A | Discharge status: Home / Self Care | Payer: Medicare Other | Source: Ambulatory Visit | Attending: Interventional Radiology | Admitting: Interventional Radiology

## 2022-07-29 DIAGNOSIS — R338 Other retention of urine: Secondary | ICD-10-CM | POA: Diagnosis not present

## 2022-07-29 MED ORDER — IOPAMIDOL (ISOVUE-370) INJECTION 76%
75.0000 mL | Freq: Once | INTRAVENOUS | Status: AC | PRN
  Administered 2022-07-29: 75 mL via INTRAVENOUS

## 2022-08-04 ENCOUNTER — Other Ambulatory Visit (HOSPITAL_COMMUNITY): Payer: Self-pay | Admitting: Interventional Radiology

## 2022-08-04 DIAGNOSIS — R339 Retention of urine, unspecified: Secondary | ICD-10-CM

## 2022-08-05 ENCOUNTER — Ambulatory Visit (INDEPENDENT_AMBULATORY_CARE_PROVIDER_SITE_OTHER): Payer: Medicare Other | Admitting: Family

## 2022-08-05 ENCOUNTER — Encounter: Payer: Self-pay | Admitting: Family

## 2022-08-05 DIAGNOSIS — I5032 Chronic diastolic (congestive) heart failure: Secondary | ICD-10-CM

## 2022-08-05 DIAGNOSIS — R1030 Lower abdominal pain, unspecified: Secondary | ICD-10-CM

## 2022-08-05 DIAGNOSIS — I10 Essential (primary) hypertension: Secondary | ICD-10-CM | POA: Diagnosis not present

## 2022-08-05 DIAGNOSIS — E1121 Type 2 diabetes mellitus with diabetic nephropathy: Secondary | ICD-10-CM

## 2022-08-05 DIAGNOSIS — Z23 Encounter for immunization: Secondary | ICD-10-CM | POA: Diagnosis not present

## 2022-08-05 DIAGNOSIS — I480 Paroxysmal atrial fibrillation: Secondary | ICD-10-CM

## 2022-08-05 DIAGNOSIS — Z1211 Encounter for screening for malignant neoplasm of colon: Secondary | ICD-10-CM | POA: Diagnosis not present

## 2022-08-05 LAB — CBC WITH DIFFERENTIAL/PLATELET
Basophils Absolute: 30 cells/uL (ref 0–200)
HCT: 43.7 % (ref 38.5–50.0)
Lymphs Abs: 2700 cells/uL (ref 850–3900)
MCH: 29.6 pg (ref 27.0–33.0)
MCHC: 32.5 g/dL (ref 32.0–36.0)
MCV: 91.2 fL (ref 80.0–100.0)
Monocytes Relative: 8.3 %
Neutro Abs: 2640 cells/uL (ref 1500–7800)
Platelets: 244 10*3/uL (ref 140–400)
RDW: 14.1 % (ref 11.0–15.0)

## 2022-08-05 NOTE — Progress Notes (Signed)
Provider: Lucetta Baehr FNP-C   Frederica Kuster, MD  Patient Care Team: Frederica Kuster, MD as PCP - General (Family Medicine) Nahser, Deloris Ping, MD as PCP - Cardiology (Cardiology) Darci Needle, MD (Endocrinology) Delorise Shiner, CPhT as [facility] Care Management (Pharmacy Technician)  Extended Emergency Contact Information Primary Emergency Contact: Theodoro Kalata [place] Home Phone: [phone] Relation: Friend Secondary Emergency Contact: Osley,[name] Address: [address]          [place], [place] Home Phone: [phone] Relation: Brother  Code Status:  Full Code  Goals of care: Advanced Directive information    04/16/2022   11:59 AM  Advanced Directives  Does Patient Have a Medical Advance Directive? No  Would patient like information on creating a medical advance directive? No - Patient declined     Chief Complaint  Patient presents with   Quality Metric Gaps    Patient is also due for AWV, updated vaccines,diabetic eye and kidney exams, and  colonoscopy   Medical Management of Chronic Issues    Patient is here for 17M F/U with A1C check he is also having some stomach pain    HPI:  Pt is a 67 y.o. male seen today for 6 months follow up for medical management of chronic issues.Also complains of lower abdominal pain for the past few days but states has resolved.Has a medical history of Type 2 DM ,Hypertension,CHF,Afib,hx of stroke,CAD,GERD,CKD stage 3, chronic bladder outlet obstruction due to prostatomegaly.Has indwelling foley catheter.states has been draining well.   CBG runs in the 110's - 130's he denies any signs of hypoglycemia/hyperglycemia.  Has not had seen an opthalmology for annual eye exam.States was following up with Dr.Groat but would like referral to another ophthalmologist.   Follows ups with podiatrist Dr.[name] Stacie Acres.states no numbness or tingling on feet.   Afib - denies  any palpitation on EliQuis.states no bleeding.  Reports no recent fall episode.  He is due for PNA ,COVID-19,Shingles and Tdap vaccine.Made awre to get vaccines at the pharmacy.Agrees to get PNA 20 vaccine here today.   Also due for colon cancer screening.Cologuard versus colonoscopy discussed patient prefers colonoscopy.  Will refer to gastroenterology.Made aware specialist office will call him to schedule appointment.   Past Medical History:  Diagnosis Date   A-fib (HCC) 11/19/2010   Acute exacerbation of congestive heart failure (HCC) 11/19/2010   Arthritis    CAD (coronary artery disease) 11/20/2010   CHF (congestive heart failure) (HCC)    CKD (chronic kidney disease) stage 3, GFR 30-59 ml/min (HCC) 05/04/2017   Coronary artery disease    Diabetes type 2, controlled (HCC) 11/19/2010   GERD (gastroesophageal reflux disease) 02/08/2015   Gout    Gout 11/20/2010   History of cerebrovascular accident (CVA) with residual deficit 05/04/2017   History of CVA (cerebrovascular accident) 11/19/2010   HTN (hypertension) 07/01/2012   Hyperlipemia 11/21/2010   Hypertension    Hypertensive emergency 11/19/2010   ICH (intracerebral hemorrhage) (HCC) 12/22/2010   Physical deconditioning 12/22/2010   Pulmonary edema 11/19/2010   Respiratory failure (HCC) 11/19/2010   Shortness of breath    Stroke (HCC) 11/19/2010   Thyroiditis 11/20/2010   Past Surgical History:  Procedure Laterality Date   IR RADIOLOGIST EVAL & MGMT  07/13/2022   PEG PLACEMENT  12/03/2010   Procedure: PERCUTANEOUS ENDOSCOPIC GASTROSTOMY (PEG) PLACEMENT;  Surgeon: Hart Carwin, MD;  Location[facility] ENDOSCOPY;  Service: Endoscopy;  Laterality: N/A;  TRACHEOSTOMY TUBE PLACEMENT  11/28/2010   Procedure: TRACHEOSTOMY;  Surgeon: Susy Frizzle, MD;  Location: [facility] OR;  Service: ENT;  Laterality: N/A;    No Known Allergies  Allergies as of 08/05/2022   No Known Allergies      Medication List        Accurate as of August 05, 2022 11:32 AM. If you have any questions, ask your nurse or doctor.          acetaminophen 500 MG tablet Commonly known as: TYLENOL Take 1,000 mg by mouth daily as needed for moderate pain, fever or headache.   Alcohol Prep 70 % Pads Use to test blood sugar daily. Dx: E11.21   apixaban 5 MG Tabs tablet Commonly known as: Eliquis Take 1 tablet (5 mg total) by mouth 2 (two) times daily.   blood glucose meter kit and supplies Dispense based on patient and insurance preference. Use up to four times daily as directed. (FOR ICD-10 E10.9, E11.9).   carvedilol 25 MG tablet Commonly known as: COREG Take 1 tablet (25 mg total) by mouth 2 (two) times daily with a meal. Needs an appointment before anymore future refills.   diltiazem 180 MG 24 hr capsule Commonly known as: CARDIZEM CD TAKE 2 CAPSULES BY MOUTH  DAILY AT NOON   empagliflozin 10 MG Tabs tablet Commonly known as: JARDIANCE Take 1 tablet (10 mg total) by mouth daily.   finasteride 5 MG tablet Commonly known as: PROSCAR Take 1 tablet (5 mg total) by mouth daily.   isosorbide mononitrate 30 MG 24 hr tablet Commonly known as: IMDUR Take 1 tablet (30 mg total) by mouth daily.   losartan 25 MG tablet Commonly known as: COZAAR Take 1 tablet (25 mg total) by mouth daily.   MENS ONE DAILY PO Take 1 tablet by mouth daily.   metFORMIN 850 MG tablet Commonly known as: GLUCOPHAGE Take 1 tablet (850 mg total) by mouth daily with breakfast.   OneTouch Delica Lancets 33G Misc Use as directed to test blood sugar once daily   OneTouch Ultra test strip Generic drug: glucose blood 1 each by Other route daily. Dx: E11.21   pantoprazole 40 MG tablet Commonly known as: PROTONIX Take 1 tablet (40 mg total) by mouth daily. What changed: when to take this   pravastatin 40 MG tablet Commonly known as: PRAVACHOL Take 1 tablet (40 mg total) by mouth daily. What changed: when to take this   sitaGLIPtin 50 MG tablet Commonly  known as: JANUVIA Take 1 tablet (50 mg total) by mouth daily.   spironolactone 25 MG tablet Commonly known as: ALDACTONE TAKE 1 TABLET BY MOUTH DAILY What changed: when to take this   tamsulosin 0.4 MG Caps capsule Commonly known as: FLOMAX Take 0.4 mg by mouth at bedtime.        Review of Systems  Constitutional:  Negative for appetite change, chills, fatigue, fever and unexpected weight change.  HENT:  Negative for congestion, ear discharge, ear pain, hearing loss, nosebleeds, postnasal drip, rhinorrhea, sinus pressure, sinus pain, sneezing and sore throat.   Eyes:  Positive for visual disturbance. Negative for pain, discharge, redness and itching.       Wears eye glasses   Respiratory:  Negative for cough, chest tightness, shortness of breath and wheezing.   Cardiovascular:  Negative for chest pain, palpitations and leg swelling.  Gastrointestinal:  Negative for abdominal distention, blood in stool, constipation, diarrhea, nausea and vomiting.  Had lower abdominal pain for the past few days but has resolved   Endocrine: Negative for cold intolerance, heat intolerance, polydipsia, polyphagia and polyuria.  Genitourinary:  Negative for difficulty urinating, dysuria, flank pain, frequency and urgency.  Musculoskeletal:  Positive for gait problem. Negative for arthralgias, back pain, joint swelling, myalgias, neck pain and neck stiffness.  Skin:  Negative for color change, pallor, rash and wound.  Neurological:  Positive for weakness. Negative for dizziness, syncope, speech difficulty, light-headedness, numbness and headaches.       Right side weakness due from stroke   Hematological:  Does not bruise/bleed easily.  Psychiatric/Behavioral:  Negative for agitation, behavioral problems, confusion, hallucinations and sleep disturbance. The patient is not nervous/anxious.     Immunization History  Administered Date(s) Administered   Fluad Quad(high Dose 65+) 11/19/2020,  11/26/2021   Influenza Whole 11/21/2010   Influenza,inj,Quad PF,6+ Mos 12/07/2012, 11/27/2013, 02/08/2015, 11/05/2016, 10/29/2017   PFIZER(Purple Top)SARS-COV-2 Vaccination 04/13/2019, 05/08/2019, 10/18/2019   Pneumococcal Polysaccharide-23 11/21/2010   Zoster Recombinant(Shingrix) 06/19/2015   Pertinent  Health Maintenance Due  Topic Date Due   OPHTHALMOLOGY EXAM  06/23/2016   Colonoscopy  02/01/2019   INFLUENZA VACCINE  08/13/2022   HEMOGLOBIN A1C  10/02/2022   FOOT EXAM  03/12/2023      03/25/2021   10:40 AM 05/18/2021   10:19 AM 11/26/2021   12:45 PM 04/01/2022    1:57 PM 04/09/2022   10:43 AM  Fall Risk  Falls in the past year? 0  0 0 0  Was there an injury with Fall? 0  0 0 0  Fall Risk Category Calculator 0  0 0 0  Fall Risk Category (Retired) Low  Low    (RETIRED) Patient Fall Risk Level Low fall risk Low fall risk Low fall risk    Patient at Risk for Falls Due to No Fall Risks  No Fall Risks No Fall Risks No Fall Risks  Fall risk Follow up Falls evaluation completed  Falls evaluation completed Falls evaluation completed    Functional Status Survey:    Vitals:   08/05/22 0910  BP: 112/68  Pulse: 73  Resp: 18  Temp: 98.6 F (37 C)  SpO2: 98%  Weight: 197 lb (89.4 kg)  Height: 5\' 11"  (1.803 m)   Body mass index is 27.48 kg/m. Physical Exam Vitals reviewed.  Constitutional:      General: He is not in acute distress.    Appearance: Normal appearance. He is overweight. He is not ill-appearing or diaphoretic.  HENT:     Head: Normocephalic.     Right Ear: Tympanic membrane, ear canal and external ear normal. There is no impacted cerumen.     Left Ear: Tympanic membrane, ear canal and external ear normal. There is no impacted cerumen.     Nose: Nose normal. No congestion or rhinorrhea.     Mouth/Throat:     Mouth: Mucous membranes are moist.     Pharynx: Oropharynx is clear. No oropharyngeal exudate or posterior oropharyngeal erythema.  Eyes:     General: No  scleral icterus.       Right eye: No discharge.        Left eye: No discharge.     Extraocular Movements: Extraocular movements intact.     Conjunctiva/sclera: Conjunctivae normal.     Pupils: Pupils are equal, round, and reactive to light.  Neck:     Vascular: No carotid bruit.  Cardiovascular:     Rate and Rhythm: Normal rate and regular  rhythm.     Pulses: Normal pulses.     Heart sounds: Normal heart sounds. No murmur heard.    No friction rub. No gallop.  Pulmonary:     Effort: Pulmonary effort is normal. No respiratory distress.     Breath sounds: Normal breath sounds. No wheezing, rhonchi or rales.  Chest:     Chest wall: No tenderness.  Abdominal:     General: Bowel sounds are normal. There is no distension.     Palpations: Abdomen is soft. There is no mass.     Tenderness: There is no abdominal tenderness. There is no right CVA tenderness, left CVA tenderness, guarding or rebound.  Genitourinary:    Comments: Indwelling foley catheter in place draining amber colored urine Musculoskeletal:        General: No swelling or tenderness. Normal range of motion.     Cervical back: Normal range of motion. No rigidity or tenderness.     Right lower leg: Edema present.     Left lower leg: No edema.     Comments: Trace edema on RLE   Lymphadenopathy:     Cervical: No cervical adenopathy.  Skin:    General: Skin is warm and dry.     Coloration: Skin is not pale.     Findings: No bruising, erythema, lesion or rash.  Neurological:     Mental Status: He is alert and oriented to person, place, and time.     Cranial Nerves: No cranial nerve deficit.     Sensory: No sensory deficit.     Motor: No weakness.     Coordination: Coordination normal.     Gait: Gait abnormal.     Comments: On wheelchair during visit   Psychiatric:        Mood and Affect: Mood normal.        Speech: Speech normal.        Behavior: Behavior normal.        Thought Content: Thought content normal.         Judgment: Judgment normal.    Labs reviewed: Recent Labs    04/13/22 1522 04/13/22 1907 04/14/22 0339 04/15/22 0923 04/16/22 0802 04/23/22 1458  NA 132*   < > 131* 137 139 137  K 5.1   < > 4.3 3.7 3.8 3.9  CL 96*   < > 100 102 108 99  CO2 19*   < > 17* 21* 21* 26  GLUCOSE 209*   < > 175* 172* 176* 106*  BUN 98*   < > 78* 45* 33* 28*  CREATININE 7.69*   < > 5.20* 2.37* 1.62* 1.33  CALCIUM 9.6   < > 9.2 9.4 9.1 10.0  MG 2.5*  --  2.3  --   --   --   PHOS 5.0*  --   --   --   --   --    < > = values in this interval not displayed.   Recent Labs    11/26/21 1335 04/13/22 0756 04/23/22 1458  AST 14 22 18   ALT 12 16 19   ALKPHOS  --  56  --   BILITOT 0.4 0.5 0.4  PROT 7.5 7.4 7.0  ALBUMIN  --  3.2*  --    Recent Labs    04/13/22 0756 04/14/22 0339 04/23/22 1458  WBC 12.2* 11.9* 8.0  NEUTROABS  --   --  4,104  HGB 13.9 13.8 13.2  HCT 42.5 40.5 40.0  MCV 90.0  88.2 88.5  PLT 210 226 415*   Lab Results  Component Value Date   TSH 1.974 05/03/2012   Lab Results  Component Value Date   HGBA1C 8.3 (H) 04/01/2022   Lab Results  Component Value Date   CHOL 139 11/26/2021   HDL 53 11/26/2021   LDLCALC 64 11/26/2021   TRIG 137 11/26/2021   CHOLHDL 2.6 11/26/2021    Significant Diagnostic Results in last 30 days:  CT ANGIO PELVIS W OR WO CONTRAST  Result Date: 07/29/2022 CLINICAL DATA:  Acute urinary retention EXAM: CT PELVIS WITH CONTRAST TECHNIQUE: Multidetector CT imaging of the pelvis was performed using the standard protocol following the bolus administration of intravenous contrast. RADIATION DOSE REDUCTION: This exam was performed according to the departmental dose-optimization program which includes automated exposure control, adjustment of the mA and/or kV according to patient size and/or use of iterative reconstruction technique. CONTRAST:  75mL ISOVUE-370 IOPAMIDOL (ISOVUE-370) INJECTION 76% COMPARISON:  CT abdomen and pelvis 04/13/2022 FINDINGS: Urinary  Tract: Foley catheter in the bladder. The bladder is nondistended and demonstrates diffuse wall thickening. Bowel:  Unremarkable visualized pelvic bowel loops. Vascular/Lymphatic: Aortic atherosclerotic calcification. No lymphadenopathy. Reproductive:  Enlarged prostate. Other:  No free intraperitoneal fluid or air. Musculoskeletal: No acute fracture. Chronic L5 pars defects with grade 1 anterolisthesis of L5. IMPRESSION: 1. Foley catheter in the bladder. The bladder is nondistended and demonstrates diffuse wall thickening. Differential considerations include obstructive uropathy or cystitis. 2. Enlarged prostate. Aortic Atherosclerosis (ICD10-I70.0). Electronically Signed   By: Minerva Fester M.D.   On: 07/29/2022 21:10   IR Radiologist Eval & Mgmt  Result Date: 07/13/2022 EXAM: NEW PATIENT OFFICE VISIT CHIEF COMPLAINT: See below HISTORY OF PRESENT ILLNESS: See below REVIEW OF SYSTEMS: See below PHYSICAL EXAMINATION: See below ASSESSMENT AND PLAN: Please refer to completed note in the electronic medical record on [facility] Roanna Banning, MD [facility] Radiology Electronically Signed   By: Roanna Banning M.D.   On: 07/13/2022 11:07    Assessment/Plan  1. Controlled type 2 diabetes mellitus with diabetic nephropathy, without long-term current use of insulin (HCC) Lab Results  Component Value Date   HGBA1C 8.3 (H) 04/01/2022  -Continue Januvia, metformin and Jardiance -Continue on pravastatin - Hemoglobin A1c - Ambulatory referral to Ophthalmology - Microalbumin / creatinine urine ratio  2. Essential hypertension Blood pressure at goal -Continue on losartan, Imdur, diltiazem, carvedilol and spironolactone - TSH - COMPLETE METABOLIC PANEL WITH GFR - CBC with Differential/Platelet  3. Chronic diastolic CHF (congestive heart failure) (HCC) No signs of fluid overload - -Continue on losartan, Imdur, diltiazem, carvedilol and  spironolactone  4. PAF (paroxysmal atrial fibrillation) (HCC) Heart rate control - Continue on carvedilol for heart rate control -Continue on Eliquis for anticoagulation  5. Colon cancer screening Asymptomatic colon cancer screening.Cologuard versus colonoscopy discussed patient prefers colonoscopy.  Will refer to gastroenterology.Made aware specialist office will call him to schedule appointment. - Ambulatory referral to Gastroenterology  6. Lower abdominal pain Hent lower abdominal pain for the past 3 days but symptoms have resolved.  No pain this visit.  Will rule out urinary tract infection due to chronic indwelling Foley catheter. - Urine Culture  7. Need for pneumococcal vaccination Afebrile  - Prevnar 20 vaccine administered today by CMA no reaction reported.  - Pneumococcal conjugate vaccine 20-valent (Prevnar 20)  Family/ staff Communication: Reviewed plan of care with patient verbalized understanding  Labs/tests ordered:  - Urine Culture - TSH - COMPLETE METABOLIC  PANEL WITH GFR - CBC with Differential/Platelet - Hemoglobin A1c - Microalbumin / creatinine urine ratio  Next Appointment : Return in about 6 months (around 02/05/2023) for medical mangement of chronic issues with Dr.Miller .   Caesar Bookman, NP

## 2022-08-06 LAB — COMPLETE METABOLIC PANEL WITH GFR
AG Ratio: 1.5 (calc) (ref 1.0–2.5)
ALT: 8 U/L — ABNORMAL LOW (ref 9–46)
AST: 12 U/L (ref 10–35)
Alkaline phosphatase (APISO): 65 U/L (ref 35–144)
BUN/Creatinine Ratio: 16 (calc) (ref 6–22)
BUN: 25 mg/dL (ref 7–25)
CO2: 28 mmol/L (ref 20–32)
Calcium: 10.4 mg/dL — ABNORMAL HIGH (ref 8.6–10.3)
Chloride: 99 mmol/L (ref 98–110)
Creat: 1.53 mg/dL — ABNORMAL HIGH (ref 0.70–1.35)
Potassium: 5.2 mmol/L (ref 3.5–5.3)
Sodium: 136 mmol/L (ref 135–146)
Total Bilirubin: 0.5 mg/dL (ref 0.2–1.2)
Total Protein: 7.4 g/dL (ref 6.1–8.1)
eGFR: 49 mL/min/{1.73_m2} — ABNORMAL LOW (ref >=60)

## 2022-08-06 LAB — HEMOGLOBIN A1C
Hgb A1c MFr Bld: 6.9 % of total Hgb — ABNORMAL HIGH (ref <5.7)
Mean Plasma Glucose: 151 mg/dL

## 2022-08-06 LAB — LIPID PANEL
Cholesterol: 130 mg/dL (ref <200)
LDL Cholesterol (Calc): 54 mg/dL (calc)
Non-HDL Cholesterol (Calc): 74 mg/dL (calc) (ref <130)
Total CHOL/HDL Ratio: 2.3 (calc) (ref <5.0)

## 2022-08-06 LAB — TSH: TSH: 3.63 mIU/L (ref 0.40–4.50)

## 2022-08-06 LAB — CBC WITH DIFFERENTIAL/PLATELET
Absolute Monocytes: 498 cells/uL (ref 200–950)
Basophils Relative: 0.5 %
Eosinophils Absolute: 132 cells/uL (ref 15–500)
Eosinophils Relative: 2.2 %
Hemoglobin: 14.2 g/dL (ref 13.2–17.1)
MPV: 10.1 fL (ref 7.5–12.5)
Neutrophils Relative %: 44 %
RBC: 4.79 10*6/uL (ref 4.20–5.80)
Total Lymphocyte: 45 %
WBC: 6 10*3/uL (ref 3.8–10.8)

## 2022-08-06 LAB — MICROALBUMIN / CREATININE URINE RATIO
Creatinine, Urine: 109 mg/dL (ref 20–320)
Microalb Creat Ratio: 206 mg/g creat — ABNORMAL HIGH (ref <30)
Microalb, Ur: 22.5 mg/dL

## 2022-08-07 ENCOUNTER — Ambulatory Visit (INDEPENDENT_AMBULATORY_CARE_PROVIDER_SITE_OTHER): Payer: Medicare Other

## 2022-08-07 DIAGNOSIS — M2041 Other hammer toe(s) (acquired), right foot: Secondary | ICD-10-CM

## 2022-08-07 DIAGNOSIS — N183 Chronic kidney disease, stage 3 unspecified: Secondary | ICD-10-CM

## 2022-08-07 DIAGNOSIS — M2042 Other hammer toe(s) (acquired), left foot: Secondary | ICD-10-CM | POA: Diagnosis not present

## 2022-08-07 DIAGNOSIS — E0822 Diabetes mellitus due to underlying condition with diabetic chronic kidney disease: Secondary | ICD-10-CM | POA: Diagnosis not present

## 2022-08-07 DIAGNOSIS — E119 Type 2 diabetes mellitus without complications: Secondary | ICD-10-CM

## 2022-08-07 DIAGNOSIS — B351 Tinea unguium: Secondary | ICD-10-CM

## 2022-08-07 NOTE — Progress Notes (Signed)

## 2022-08-10 ENCOUNTER — Other Ambulatory Visit: Payer: Self-pay

## 2022-08-11 ENCOUNTER — Telehealth: Payer: Self-pay

## 2022-08-11 MED ORDER — NITROFURANTOIN MONOHYD MACRO 100 MG PO CAPS: 100.0000 mg | ORAL_CAPSULE | Freq: Two times a day (BID) | ORAL | 0 refills | Status: DC

## 2022-08-11 NOTE — Telephone Encounter (Addendum)
Patient called requesting to speak with [facility]. Toma Copier was occupied and I offered to assist the patient.  Patient states he never mentioned that he has Nitrofurantion 100 mg as indicated on labs dated 08/05/22. Patient states he has an old rx that [place] gave him (sulfamethoxazole), patient was unable to locate the dose. Patient is aware that I will send rx as indicated on recent urine culture (as it differs from what he has on hand) to pharmacy and he is to complete the full course of medication.   High risk or very high risk warning populated when attempting to refill medication. RX request sent to PCP for review and approval if warranted.

## 2022-08-11 NOTE — Telephone Encounter (Signed)
RX sent by Richarda Blade, NP

## 2022-08-17 ENCOUNTER — Telehealth: Payer: Self-pay | Admitting: Podiatry

## 2022-08-17 NOTE — Telephone Encounter (Signed)
Pt picked up diabetic shoes 1-2 weeks ago and states his shoes are hurting him on the sides of his feet. Pt states he has not worn his shoes outside of his house.

## 2022-08-18 ENCOUNTER — Ambulatory Visit: Payer: Medicare Other

## 2022-08-18 ENCOUNTER — Other Ambulatory Visit: Payer: Self-pay

## 2022-08-18 DIAGNOSIS — I2510 Atherosclerotic heart disease of native coronary artery without angina pectoris: Secondary | ICD-10-CM

## 2022-08-18 DIAGNOSIS — I10 Essential (primary) hypertension: Secondary | ICD-10-CM

## 2022-08-18 MED ORDER — CARVEDILOL 25 MG PO TABS
25.0000 mg | ORAL_TABLET | Freq: Two times a day (BID) | ORAL | 1 refills | Status: DC
Start: 2022-08-18 — End: 2023-02-22

## 2022-08-18 NOTE — Progress Notes (Signed)
Patient returned with DM shoes that were delivered on 7/26 shoes are too narrow  Patient was instructed come back when new shoes are in  Will order 11 WD as patient was delivered an and feels too snug   Addison Bailey CPed, CFo, CFm

## 2022-08-21 ENCOUNTER — Other Ambulatory Visit: Payer: Self-pay | Admitting: Radiology

## 2022-08-21 DIAGNOSIS — N401 Enlarged prostate with lower urinary tract symptoms: Secondary | ICD-10-CM

## 2022-08-24 ENCOUNTER — Other Ambulatory Visit: Payer: Self-pay | Admitting: Radiology

## 2022-08-24 NOTE — H&P (Signed)
Referring Physician(s): Winter,C   Supervising Physician: Roanna Banning  Patient Status:  [facility]  Chief Complaint:  Benign prostatic hyperplasia with lower urinary tract symptoms  Subjective: Patient known to IR team from consultation with Dr. Milford Cage on 07/13/22 discuss treatment options for BPH with LUTS.  He is a 67 year old male with past medical history significant for hypertension, atrial fibrillation, arthritis, coronary artery disease, diabetes, GERD, gout, hyperlipidemia, prior intracerebral hemorrhage 2012, chronic kidney disease, CHF, prior CVA in 2012 with some residual left sided weakness, bladder outlet obstruction with Foley dependence, UTIs.  Following discussion with Dr.Mugweru patient was deemed an appropriate candidate for prostate artery embolization and presents today for the procedure. He denies fever,HA,CP,dyspnea, cough, worsening abd/back pain,N/V or bleeding.   Past Medical History:  Diagnosis Date   A-fib (HCC) 11/19/2010   Acute exacerbation of congestive heart failure (HCC) 11/19/2010   Arthritis    CAD (coronary artery disease) 11/20/2010   CHF (congestive heart failure) (HCC)    CKD (chronic kidney disease) stage 3, GFR 30-59 ml/min (HCC) 05/04/2017   Coronary artery disease    Diabetes type 2, controlled (HCC) 11/19/2010   GERD (gastroesophageal reflux disease) 02/08/2015   Gout    Gout 11/20/2010   History of cerebrovascular accident (CVA) with residual deficit 05/04/2017   History of CVA (cerebrovascular accident) 11/19/2010   HTN (hypertension) 07/01/2012   Hyperlipemia 11/21/2010   Hypertension    Hypertensive emergency 11/19/2010   ICH (intracerebral hemorrhage) (HCC) 12/22/2010   Physical deconditioning 12/22/2010   Pulmonary edema 11/19/2010   Respiratory failure (HCC) 11/19/2010   Shortness of breath    Stroke (HCC) 11/19/2010   Thyroiditis 11/20/2010   Past Surgical History:  Procedure Laterality Date   IR RADIOLOGIST EVAL & MGMT   07/13/2022   PEG PLACEMENT  12/03/2010   Procedure: PERCUTANEOUS ENDOSCOPIC GASTROSTOMY (PEG) PLACEMENT;  Surgeon: Hart Carwin, MD;  Location: [facility] ENDOSCOPY;  Service: Endoscopy;  Laterality: N/A;   TRACHEOSTOMY TUBE PLACEMENT  11/28/2010   Procedure: TRACHEOSTOMY;  Surgeon: Susy Frizzle, MD;  Location: [facility] OR;  Service: ENT;  Laterality: N/A;       Allergies: Patient has no known allergies.  Medications: Prior to Admission medications   Medication Sig Start Date End Date Taking? Authorizing Provider  acetaminophen (TYLENOL) 500 MG tablet Take 1,000 mg by mouth daily as needed for moderate pain, fever or headache.    [provider]  Alcohol Swabs (ALCOHOL PREP) 70 % PADS Use to test blood sugar daily. Dx: E11.21 09/11/21   Frederica Kuster, MD  apixaban (ELIQUIS) 5 MG TABS tablet Take 1 tablet (5 mg total) by mouth 2 (two) times daily. 03/30/22   Nahser, Deloris Ping, MD  blood glucose meter kit and supplies Dispense based on patient and insurance preference. Use up to four times daily as directed. (FOR ICD-10 E10.9, E11.9). 07/14/21   Frederica Kuster, MD  carvedilol (COREG) 25 MG tablet Take 1 tablet (25 mg total) by mouth 2 (two) times daily with a meal. Needs an appointment before anymore future refills. 08/18/22   Frederica Kuster, MD  diltiazem (CARDIZEM CD) 180 MG 24 hr capsule TAKE 2 CAPSULES BY MOUTH  DAILY AT NOON 05/12/22   Swinyer, Zachary George, NP  empagliflozin (JARDIANCE) 10 MG TABS tablet Take 1 tablet (10 mg total) by mouth daily. 02/02/22   Frederica Kuster, MD  finasteride (PROSCAR) 5 MG tablet Take 1 tablet (5 mg total) by mouth daily. 04/17/22  Ghimire, Werner Lean, MD  glucose blood (ONETOUCH ULTRA) test strip 1 each by Other route daily. Dx: E11.21 04/15/22   Frederica Kuster, MD  isosorbide mononitrate (IMDUR) 30 MG 24 hr tablet Take 1 tablet (30 mg total) by mouth daily. 04/15/22   Frederica Kuster, MD  losartan (COZAAR) 25 MG tablet Take 1 tablet (25 mg total) by mouth  daily. 12/18/20   Nahser, Deloris Ping, MD  metFORMIN (GLUCOPHAGE) 850 MG tablet Take 1 tablet (850 mg total) by mouth daily with breakfast. 02/02/22   Frederica Kuster, MD  Multiple Vitamins-Minerals (MENS ONE DAILY PO) Take 1 tablet by mouth daily.    [provider]  nitrofurantoin, macrocrystal-monohydrate, (MACROBID) 100 MG capsule Take 1 capsule (100 mg total) by mouth 2 (two) times daily. 08/11/22   Ngetich, Donalee Citrin, NP  ONETOUCH DELICA LANCETS 33G MISC Use as directed to test blood sugar once daily 12/06/17   Marcine Matar, MD  pantoprazole (PROTONIX) 40 MG tablet Take 1 tablet (40 mg total) by mouth daily. Patient taking differently: Take 40 mg by mouth daily with supper. 12/08/21   Frederica Kuster, MD  pravastatin (PRAVACHOL) 40 MG tablet Take 1 tablet (40 mg total) by mouth daily. Patient taking differently: Take 40 mg by mouth at bedtime. 02/02/22   Frederica Kuster, MD  sitaGLIPtin (JANUVIA) 50 MG tablet Take 1 tablet (50 mg total) by mouth daily. 05/13/22   Frederica Kuster, MD  spironolactone (ALDACTONE) 25 MG tablet TAKE 1 TABLET BY MOUTH DAILY Patient taking differently: Take 25 mg by mouth daily with lunch. 03/03/22   Frederica Kuster, MD  tamsulosin (FLOMAX) 0.4 MG CAPS capsule Take 0.4 mg by mouth at bedtime.    [provider]     Vital Signs: Vitals:   08/25/22 0741  BP: (!) 119/91  Pulse: 64  Resp: 18  Temp: 97.9 F (36.6 C)  SpO2: 98%      Code Status: FULL CODE  Physical Exam: awake/alert; answers questions spont ; some sl garbled speech; chest- CTA bilat; heart- nl rate, irreg rhythm; abd- soft,+BS,NT; bilat LE edema; foley in place draining yellow urine  Imaging: No results found.  Labs:  CBC: Recent Labs    04/13/22 0756 04/14/22 0339 04/23/22 1458 08/05/22 1203  WBC 12.2* 11.9* 8.0 6.0  HGB 13.9 13.8 13.2 14.2  HCT 42.5 40.5 40.0 43.7  PLT 210 226 415* 244    COAGS: No results for input(s): "INR", "APTT" in the last  8760 hours.  BMP: Recent Labs    04/13/22 1907 04/14/22 0339 04/15/22 0923 04/16/22 0802 04/23/22 1458 08/05/22 1203  NA 131* 131* 137 139 137 136  K 4.4 4.3 3.7 3.8 3.9 5.2  CL 96* 100 102 108 99 99  CO2 21* 17* 21* 21* 26 28  GLUCOSE 271* 175* 172* 176* 106* 123  BUN 87* 78* 45* 33* 28* 25  CALCIUM 9.2 9.2 9.4 9.1 10.0 10.4*  CREATININE 6.47* 5.20* 2.37* 1.62* 1.33 1.53*  GFRNONAA 9* 11* 29* 46*  --   --     LIVER FUNCTION TESTS: Recent Labs    11/26/21 1335 04/13/22 0756 04/23/22 1458 08/05/22 1203  BILITOT 0.4 0.5 0.4 0.5  AST 14 22 18 12   ALT 12 16 19  8*  ALKPHOS  --  56  --   --   PROT 7.5 7.4 7.0 7.4  ALBUMIN  --  3.2*  --   --     Assessment  and Plan: 67 year old male with past medical history significant for hypertension, atrial fibrillation, arthritis, coronary artery disease, diabetes, GERD, gout, hyperlipidemia, prior intracerebral hemorrhage 2012, chronic kidney disease, CHF, prior CVA in 2012 with some residual left sided weakness, bladder outlet obstruction with Foley dependence, UTIs and BPH with LUTS.  Following consultation with Dr.Mugweru on 07/13/22 patient was deemed an appropriate candidate for prostate artery embolization and presents today for the procedure.Risks and benefits of procedure were discussed with the patient including, but not limited to bleeding, infection, vascular injury or contrast induced renal failure.  This interventional procedure involves the use of X-rays and because of the nature of the planned procedure, it is possible that we will have prolonged use of X-ray fluoroscopy.  Potential radiation risks to you include (but are not limited to) the following: - A slightly elevated risk for cancer  several years later in life. This risk is typically less than 0.5% percent. This risk is low in comparison to the normal incidence of human cancer, which is 33% for women and 50% for men according to the [employer]. -  Radiation induced injury can include skin redness, resembling a rash, tissue breakdown / ulcers and hair loss (which can be temporary or permanent).   The likelihood of either of these occurring depends on the difficulty of the procedure and whether you are sensitive to radiation due to previous procedures, disease, or genetic conditions.   IF your procedure requires a prolonged use of radiation, you will be notified and given written instructions for further action.  It is your responsibility to monitor the irradiated area for the 2 weeks following the procedure and to notify your physician if you are concerned that you have suffered a radiation induced injury.    All of the patient's questions were answered, patient is agreeable to proceed.  Consent signed and in chart.      Electronically Signed: D. Jeananne Rama, PA-C 08/24/2022, 9:54 AM   I spent a total of 25 minutes at the the patient's bedside AND on the patient's hospital floor or unit, greater than 50% of which was counseling/coordinating care for prostate artery embolization

## 2022-08-25 ENCOUNTER — Other Ambulatory Visit (HOSPITAL_COMMUNITY): Payer: Self-pay | Admitting: Interventional Radiology

## 2022-08-25 ENCOUNTER — Other Ambulatory Visit: Payer: Self-pay

## 2022-08-25 ENCOUNTER — Ambulatory Visit (HOSPITAL_COMMUNITY)
Admission: RE | Admit: 2022-08-25 | Discharge: 2022-08-25 | Disposition: A | Discharge status: Home / Self Care | Payer: Medicare Other | Source: Ambulatory Visit | Attending: Interventional Radiology | Admitting: Interventional Radiology

## 2022-08-25 ENCOUNTER — Other Ambulatory Visit (HOSPITAL_COMMUNITY): Payer: Self-pay

## 2022-08-25 ENCOUNTER — Encounter (HOSPITAL_COMMUNITY): Payer: Self-pay

## 2022-08-25 DIAGNOSIS — N401 Enlarged prostate with lower urinary tract symptoms: Secondary | ICD-10-CM | POA: Insufficient documentation

## 2022-08-25 DIAGNOSIS — R339 Retention of urine, unspecified: Secondary | ICD-10-CM | POA: Diagnosis not present

## 2022-08-25 DIAGNOSIS — I130 Hypertensive heart and chronic kidney disease with heart failure and stage 1 through stage 4 chronic kidney disease, or unspecified chronic kidney disease: Secondary | ICD-10-CM | POA: Diagnosis not present

## 2022-08-25 DIAGNOSIS — N183 Chronic kidney disease, stage 3 unspecified: Secondary | ICD-10-CM | POA: Diagnosis not present

## 2022-08-25 DIAGNOSIS — N138 Other obstructive and reflux uropathy: Secondary | ICD-10-CM

## 2022-08-25 DIAGNOSIS — Z7984 Long term (current) use of oral hypoglycemic drugs: Secondary | ICD-10-CM | POA: Insufficient documentation

## 2022-08-25 DIAGNOSIS — I509 Heart failure, unspecified: Secondary | ICD-10-CM | POA: Insufficient documentation

## 2022-08-25 DIAGNOSIS — E1122 Type 2 diabetes mellitus with diabetic chronic kidney disease: Secondary | ICD-10-CM | POA: Diagnosis not present

## 2022-08-25 HISTORY — PX: IR EMBO TUMOR ORGAN ISCHEMIA INFARCT INC GUIDE ROADMAPPING: IMG5449

## 2022-08-25 HISTORY — PX: IR US GUIDE VASC ACCESS LEFT: IMG2389

## 2022-08-25 HISTORY — PX: IR US GUIDE VASC ACCESS RIGHT: IMG2390

## 2022-08-25 HISTORY — PX: IR ANGIOGRAM PELVIS SELECTIVE OR SUPRASELECTIVE: IMG661

## 2022-08-25 HISTORY — PX: IR ANGIOGRAM SELECTIVE EACH ADDITIONAL VESSEL: IMG667

## 2022-08-25 LAB — CBC WITH DIFFERENTIAL/PLATELET
Abs Immature Granulocytes: 0.03 10*3/uL (ref 0.00–0.07)
Basophils Absolute: 0 10*3/uL (ref 0.0–0.1)
Basophils Relative: 1 %
Eosinophils Absolute: 0.1 10*3/uL (ref 0.0–0.5)
Eosinophils Relative: 2 %
HCT: 44.4 % (ref 39.0–52.0)
Hemoglobin: 14 g/dL (ref 13.0–17.0)
Immature Granulocytes: 1 %
Lymphocytes Relative: 43 %
Lymphs Abs: 2.2 10*3/uL (ref 0.7–4.0)
MCH: 29.7 pg (ref 26.0–34.0)
MCHC: 31.5 g/dL (ref 30.0–36.0)
MCV: 94.1 fL (ref 80.0–100.0)
Monocytes Absolute: 0.5 10*3/uL (ref 0.1–1.0)
Monocytes Relative: 9 %
Neutro Abs: 2.2 10*3/uL (ref 1.7–7.7)
Neutrophils Relative %: 44 %
Platelets: 208 10*3/uL (ref 150–400)
RBC: 4.72 MIL/uL (ref 4.22–5.81)
RDW: 14.4 % (ref 11.5–15.5)
WBC: 5 10*3/uL (ref 4.0–10.5)
nRBC: 0 % (ref 0.0–0.2)

## 2022-08-25 LAB — BASIC METABOLIC PANEL
Anion gap: 9 (ref 5–15)
BUN: 23 mg/dL (ref 8–23)
CO2: 26 mmol/L (ref 22–32)
Calcium: 10.1 mg/dL (ref 8.9–10.3)
Chloride: 101 mmol/L (ref 98–111)
Creatinine, Ser: 1.35 mg/dL — ABNORMAL HIGH (ref 0.61–1.24)
GFR, Estimated: 57 mL/min — ABNORMAL LOW (ref >60)
Glucose, Bld: 104 mg/dL — ABNORMAL HIGH (ref 70–99)
Potassium: 4.5 mmol/L (ref 3.5–5.1)
Sodium: 136 mmol/L (ref 135–145)

## 2022-08-25 LAB — PSA: Prostatic Specific Antigen: 4.45 ng/mL — ABNORMAL HIGH (ref 0.00–4.00)

## 2022-08-25 LAB — PROTIME-INR
INR: 1.4 — ABNORMAL HIGH (ref 0.8–1.2)
Prothrombin Time: 17.2 seconds — ABNORMAL HIGH (ref 11.4–15.2)

## 2022-08-25 LAB — GLUCOSE, CAPILLARY: Glucose-Capillary: 104 mg/dL — ABNORMAL HIGH (ref 70–99)

## 2022-08-25 MED ORDER — IOHEXOL 300 MG/ML  SOLN: 100.0000 mL | Freq: Once | INTRAMUSCULAR | Status: DC | PRN

## 2022-08-25 MED ORDER — LIDOCAINE HCL 1 % IJ SOLN
20.0000 mL | Freq: Once | INTRAMUSCULAR | Status: AC
  Administered 2022-08-25: 10 mL via INTRADERMAL

## 2022-08-25 MED ORDER — SODIUM CHLORIDE 0.9 % IV SOLN
INTRAVENOUS | Status: AC | PRN
  Administered 2022-08-25: 2 g via INTRAVENOUS

## 2022-08-25 MED ORDER — FENTANYL CITRATE (PF) 100 MCG/2ML IJ SOLN
INTRAMUSCULAR | Status: AC
  Filled 2022-08-25: qty 2

## 2022-08-25 MED ORDER — PHENAZOPYRIDINE HCL 100 MG PO TABS
100.0000 mg | ORAL_TABLET | Freq: Three times a day (TID) | ORAL | 0 refills | Status: DC | PRN
  Filled 2022-08-25: qty 21, 7d supply, fill #0

## 2022-08-25 MED ORDER — IOHEXOL 300 MG/ML  SOLN: 50.0000 mL | Freq: Once | INTRAMUSCULAR | Status: DC | PRN

## 2022-08-25 MED ORDER — KETOROLAC TROMETHAMINE 15 MG/ML IJ SOLN
INTRAMUSCULAR | Status: AC | PRN
  Administered 2022-08-25 (×2): 15 mg via INTRAVENOUS

## 2022-08-25 MED ORDER — SODIUM CHLORIDE 0.9 % IV SOLN
2.0000 g | Freq: Once | INTRAVENOUS | Status: DC
  Filled 2022-08-25: qty 20

## 2022-08-25 MED ORDER — FENTANYL CITRATE (PF) 100 MCG/2ML IJ SOLN
INTRAMUSCULAR | Status: AC | PRN
  Administered 2022-08-25 (×5): 50 ug via INTRAVENOUS

## 2022-08-25 MED ORDER — DEXAMETHASONE SODIUM PHOSPHATE 10 MG/ML IJ SOLN
8.0000 mg | Freq: Once | INTRAMUSCULAR | Status: AC
  Administered 2022-08-25: 8 mg via INTRAVENOUS
  Filled 2022-08-25: qty 1

## 2022-08-25 MED ORDER — NITROGLYCERIN IN D5W 100-5 MCG/ML-% IV SOLN
INTRAVENOUS | Status: AC
  Filled 2022-08-25: qty 250

## 2022-08-25 MED ORDER — MIDAZOLAM HCL 2 MG/2ML IJ SOLN
INTRAMUSCULAR | Status: AC | PRN
  Administered 2022-08-25 (×5): 1 mg via INTRAVENOUS

## 2022-08-25 MED ORDER — SODIUM CHLORIDE 0.9 % IV SOLN: INTRAVENOUS | Status: DC

## 2022-08-25 MED ORDER — SOLIFENACIN SUCCINATE 5 MG PO TABS
5.0000 mg | ORAL_TABLET | Freq: Every day | ORAL | 0 refills | Status: DC
  Filled 2022-08-25: qty 7, 7d supply, fill #0

## 2022-08-25 MED ORDER — LIDOCAINE HCL (PF) 1 % IJ SOLN
INTRAMUSCULAR | Status: AC
  Filled 2022-08-25: qty 30

## 2022-08-25 MED ORDER — MIDAZOLAM HCL 2 MG/2ML IJ SOLN
INTRAMUSCULAR | Status: AC
  Filled 2022-08-25: qty 2

## 2022-08-25 MED ORDER — KETOROLAC TROMETHAMINE 30 MG/ML IJ SOLN
INTRAMUSCULAR | Status: AC
  Filled 2022-08-25: qty 1

## 2022-08-25 MED ORDER — NAPROXEN 250 MG PO TABS
500.0000 mg | ORAL_TABLET | Freq: Once | ORAL | Status: AC
  Administered 2022-08-25: 500 mg via ORAL
  Filled 2022-08-25: qty 2

## 2022-08-25 MED ORDER — METHYLPREDNISOLONE 4 MG PO TBPK
ORAL_TABLET | ORAL | 0 refills | Status: DC
  Filled 2022-08-25: qty 21, 6d supply, fill #0

## 2022-08-25 MED ORDER — CIPROFLOXACIN HCL 500 MG PO TABS
500.0000 mg | ORAL_TABLET | Freq: Two times a day (BID) | ORAL | 0 refills | Status: DC
  Filled 2022-08-25: qty 14, 7d supply, fill #0

## 2022-08-25 MED ORDER — OXYCODONE HCL 5 MG PO TABS: 10.0000 mg | ORAL_TABLET | ORAL | Status: DC | PRN

## 2022-08-25 NOTE — Discharge Instructions (Addendum)
Please call Interventional Radiology clinic [phone] with any questions or concerns.  You may remove your dressing and shower tomorrow.  After the procedure, it is common to have: Increased frequency of urination Mild pressure or discomfort in the low belly Blood in the urine and/or painful urination for 7-14 days  Blood in the semen for 7-21 days Pressure, pain, or the urge to have a bowel movement when urinating and/or a small amount of blood in the stool for 4-7 days Low-grade fever (temperature less than 101.5 degrees Fahrenheit or 38.6 degrees Celsius) for 4-7 days Tenderness and/or bruising around the puncture site. Bruising can take 2-3 weeks to go away completely. Please call immediately if you feel a lump that is growing or varies with your pulse  Follow these instructions at home:  Medication: Do not use Aspirin or ibuprofen products, such as Advil or Motrin, as it may increase bleeding You may resume your usual medications as ordered by your doctor If your doctor prescribed antibiotics, take them as directed. Do not stop taking them just because you feel better  Eating and drinking: Drink plenty of liquids to keep your urine pale yellow You can resume your regular diet as directed by your doctor   Activity For procedures where we entered your artery from the top of your leg (groin):  Avoid strenuous activity, such as climbing long flights of stairs, for 24 hours after your procedure  No heavy lifting (greater than 15-20 pounds or 6-9 kg) for 7 days or until the puncture site heels Do not take baths, swim, or use a hot tub until your health care provider approves. Take showers only Keep all follow-up visits as told by your doctor  Care of the procedure site Follow instructions from your health care provider about how to take care of the puncture site.  Wash your hands with soap and water before you change your bandage (dressing). If soap and water are not available, use  hand sanitizer Change your dressing as told by your health care provider Leave stitches (sutures), skin glue, or adhesive strips in place. These skin closures may need to stay in place for 2 weeks or longer. If adhesive strip edges start to loosen and curl up, you may trim the loose edges. Do not remove adhesive strips completely unless your health care provider tells you to do that Check your puncture site every day for signs of infection. Check for: More redness, swelling, or pain Warmth/heat at puncture site Pus or a bad odor  Contact a health care provider if: You have a fever You have more redness, swelling, or pain around your incision You have more fluid or blood coming from your incision site Your incision feels warm to the touch You have pus or a bad smell coming from your incision You have a rash You have nausea, or you cannot eat or drink anything without vomiting  Get help right away if: You have trouble breathing You have chest pain You have severe pain in your abdomen, and it does not get better with medicine You have leg pain or leg swelling You feel dizzy, or you faint   Please call Interventional Radiology clinic [phone] with any questions or concerns.  You may remove your dressing and shower tomorrow.Moderate Conscious Sedation-Care After  This sheet gives you information about how to care for yourself after your procedure. Your health care provider may also give you more specific instructions. If you have problems or questions, contact your health care  provider.  After the procedure, it is common to have: Sleepiness for several hours. Impaired judgment for several hours. Difficulty with balance. Vomiting if you eat too soon.  Follow these instructions at home:  Rest. Do not participate in activities where you could fall or become injured. Do not drive or use machinery. Do not drink alcohol. Do not take sleeping pills or medicines that cause  drowsiness. Do not make important decisions or sign legal documents. Do not take care of children on your own.  Eating and drinking Follow the diet recommended by your health care provider. Drink enough fluid to keep your urine pale yellow. If you vomit: Drink water, juice, or soup when you can drink without vomiting. Make sure you have little or no nausea before eating solid foods.  General instructions Take over-the-counter and prescription medicines only as told by your health care provider. Have a responsible adult stay with you for the time you are told. It is important to have someone help care for you until you are awake and alert. Do not smoke. Keep all follow-up visits as told by your health care provider. This is important.  Contact a health care provider if: You are still sleepy or having trouble with balance after 24 hours. You feel light-headed. You keep feeling nauseous or you keep vomiting. You develop a rash. You have a fever. You have redness or swelling around the IV site.  Get help right away if: You have trouble breathing. You have new-onset confusion at home.  This information is not intended to replace advice given to you by your health care provider. Make sure you discuss any questions you have with your healthcare provider.  Please remember, per Dr. Milford Cage, you are to call the urology clinic and notify them that your indwelling urinary catheter was exchanged Tuesday 08/25/2022.   If you are still leaking considerably from the catheter please let them know, as they may need to evaluate the catheter,  otherwise you may cancel the appointment for catheter exchange that is scheduled for 08/27/22. In about a month from now you will need to make sure you have a voiding trail appointment, at your urology clinic, scheduled to evaluate if it is appropriate to remove the catheter.   Please stop at [facility] to pick up meds that have been called  in for you that you will need to start taking after today's procedure.

## 2022-08-25 NOTE — Sedation Documentation (Signed)
6 fr AngioSeal deployed

## 2022-08-25 NOTE — Procedures (Signed)
Vascular and Interventional Radiology Procedure Note  Patient: [name] DOB: December 01, 1955 Medical Record Number: [id number] Note Date/Time: 08/25/22 9:35 AM   Performing Physician: Roanna Banning, MD Assistant(s): None  Diagnosis: BPH w LUTS. Catheter dependent.   Procedure(s):  PELVIC ARTERIOGRAPHY PROSTATE ARTERY EMBOLIZATION    Anesthesia: Conscious Sedation Complications: None Estimated Blood Loss: Minimal Specimens: None   Findings:  - access via the RIGHT femoral artery. - Prostatomegaly with bilateral tortuous prostatic arteries.  - Collateralization bilaterally, making for challenging particle embolization.  - Bilateral recurrent internal pudendal artery "Pena cava" branch embolization, with a 2 mm x 2 cm Ruby coils. - Successful 100-300 and 300-500 um microparticle embolization of bilateral prostatic arteries to stasis - AngioSeal closure at the R groin with distal RLE pulses at the end of the case.   Plan: - Post sheath removal precautions.  - Bedrest with RLE straight x2hrs.  Final report to follow once all images are reviewed and compared with previous studies.  See detailed dictation with images in PACS. The patient tolerated the procedure well without incident or complication and was returned to Recovery in stable condition.    Roanna Banning, MD [facility] Rad[facility]   Pager. [phone] Clinic. [phone]

## 2022-08-31 ENCOUNTER — Ambulatory Visit: Payer: Medicare Other

## 2022-08-31 NOTE — Progress Notes (Signed)
Patient presents today to pick up diabetic shoes and insoles that were re-ordered  No to bill Patient is much happier with fit of shoe width provides more room and reduces pressure  Patient was dispensed 1 pair of diabetic shoes and 3 pairs of foam casted diabetic insoles. Fit was satisfactory. Instructions for break-in and wear was reviewed and a copy was given to the patient.   Re-appointment for regularly scheduled diabetic foot care visits or if they should experience any trouble with the shoes or insoles.   [name] CPed, CFo, CFm

## 2022-09-01 ENCOUNTER — Other Ambulatory Visit: Payer: Self-pay

## 2022-09-01 DIAGNOSIS — I10 Essential (primary) hypertension: Secondary | ICD-10-CM

## 2022-09-01 MED ORDER — SPIRONOLACTONE 25 MG PO TABS
25.0000 mg | ORAL_TABLET | Freq: Every day | ORAL | 1 refills | Status: DC
Start: 2022-09-01 — End: 2023-02-22

## 2022-09-01 NOTE — Telephone Encounter (Signed)
Patient medication has High Risk Warnings. Medication pend and sent to Richarda Blade, NP

## 2022-09-08 DIAGNOSIS — I69898 Other sequelae of other cerebrovascular disease: Secondary | ICD-10-CM | POA: Diagnosis not present

## 2022-09-08 DIAGNOSIS — H25813 Combined forms of age-related cataract, bilateral: Secondary | ICD-10-CM | POA: Diagnosis not present

## 2022-09-08 DIAGNOSIS — H43822 Vitreomacular adhesion, left eye: Secondary | ICD-10-CM | POA: Diagnosis not present

## 2022-09-08 DIAGNOSIS — E119 Type 2 diabetes mellitus without complications: Secondary | ICD-10-CM | POA: Diagnosis not present

## 2022-09-08 LAB — HM DIABETES EYE EXAM

## 2022-09-08 NOTE — Telephone Encounter (Signed)
Medication sent to pharmacy by PCP Venita Sheffield, MD

## 2022-09-10 DIAGNOSIS — R972 Elevated prostate specific antigen [PSA]: Secondary | ICD-10-CM | POA: Diagnosis not present

## 2022-09-10 DIAGNOSIS — R338 Other retention of urine: Secondary | ICD-10-CM | POA: Diagnosis not present

## 2022-09-21 NOTE — Addendum Note (Signed)
Encounter addended by: Arby Barrette on: 09/21/2022 2:15 PM  Actions taken: Imaging Exam ended, Charge Capture section accepted

## 2022-09-25 ENCOUNTER — Ambulatory Visit
Admission: RE | Admit: 2022-09-25 | Discharge: 2022-09-25 | Disposition: A | Discharge status: Home / Self Care | Payer: Medicare Other | Source: Ambulatory Visit | Attending: Radiology | Admitting: Radiology

## 2022-09-25 DIAGNOSIS — N401 Enlarged prostate with lower urinary tract symptoms: Secondary | ICD-10-CM

## 2022-09-25 DIAGNOSIS — N138 Other obstructive and reflux uropathy: Secondary | ICD-10-CM | POA: Diagnosis not present

## 2022-09-25 HISTORY — PX: IR RADIOLOGIST EVAL & MGMT: IMG5224

## 2022-09-25 NOTE — Progress Notes (Addendum)
Reason for visit: BPH with LUTS, Foley catheter dependent. Post prostate embolization.  Care Team(s): Primary Care; Frederica Kuster, MD Geriatrics; Medina-Vargas, Margit Banda, NP  Urology; Winter,Christopher Clifton Custard. MD  Virtual Visit via Telephone Note   I connected with [name] on 09/25/22 by telephone and verified that I am speaking with the correct person using two identifiers. I discussed the limitations, risks, security and privacy concerns of performing an evaluation and management service by telephone and the availability of in-person appointments.   History of Present Illness:  [name] is a 67 y.o. male comorbid w PMHx significant for HTN, CKD 3, CHF and prior CVA in 2012 w residual paresis. Relevant Hx of acute bladder outlet obstruction secondary to enlarged prostate with ER visit on 04/13/22 requiring foley catheter placement and dependence, with unsuccessful subsequent voiding trials. Pt is known to Urology, Dr Liliane Shi, and is on medical management with tamsulosin and finasteride. Cystoscopy on 06/15/22 which was revealing for persistent urinary retention tus Pt was referred for prostate embolization.   He underwent prostate artery embolization on 08/25/22 and denied discomfort after the procedure. He has noticed that his urinary symptoms have improved and had his foley catheter successfully removed 2 wks post procedure on 08/2922, after successful voiding trial. His QoL is much improved. ROS negative.   I used a International Prostatism Symptom Score* (IPSS) Score to quantify his symptoms: 2 points, previously 14 (pre PAE)   *Gery Pray MJ, Melene Muller MP, et al. The American Urological Association symptom index for benign prostatic hyperplasia. The Measurement Committee of the American Urological Association. J Urol A5431891; 272:5366.   Review of Systems: A 12 point ROS discussed and pertinent positives are indicated in the HPI above.  All other systems are  negative.   Past Medical History:  Diagnosis Date   A-fib (HCC) 11/19/2010   Acute exacerbation of congestive heart failure (HCC) 11/19/2010   Arthritis    CAD (coronary artery disease) 11/20/2010   CHF (congestive heart failure) (HCC)    CKD (chronic kidney disease) stage 3, GFR 30-59 ml/min (HCC) 05/04/2017   Coronary artery disease    Diabetes type 2, controlled (HCC) 11/19/2010   GERD (gastroesophageal reflux disease) 02/08/2015   Gout    Gout 11/20/2010   History of cerebrovascular accident (CVA) with residual deficit 05/04/2017   History of CVA (cerebrovascular accident) 11/19/2010   HTN (hypertension) 07/01/2012   Hyperlipemia 11/21/2010   Hypertension    Hypertensive emergency 11/19/2010   ICH (intracerebral hemorrhage) (HCC) 12/22/2010   Physical deconditioning 12/22/2010   Pulmonary edema 11/19/2010   Respiratory failure (HCC) 11/19/2010   Shortness of breath    Stroke (HCC) 11/19/2010   Thyroiditis 11/20/2010    Past Surgical History:  Procedure Laterality Date   IR ANGIOGRAM PELVIS SELECTIVE OR SUPRASELECTIVE  08/25/2022   IR ANGIOGRAM SELECTIVE EACH ADDITIONAL VESSEL  08/25/2022   IR ANGIOGRAM SELECTIVE EACH ADDITIONAL VESSEL  08/25/2022   IR EMBO TUMOR ORGAN ISCHEMIA INFARCT INC GUIDE ROADMAPPING  08/25/2022   IR RADIOLOGIST EVAL & MGMT  07/13/2022   IR RADIOLOGIST EVAL & MGMT  09/25/2022   IR US GUIDE VASC ACCESS LEFT  08/25/2022   IR US GUIDE VASC ACCESS LEFT  08/25/2022   IR US GUIDE VASC ACCESS RIGHT  08/25/2022   PEG PLACEMENT  12/03/2010   Procedure: PERCUTANEOUS ENDOSCOPIC GASTROSTOMY (PEG) PLACEMENT;  Surgeon: Hart Carwin, MD;  Location: [facility] ENDOSCOPY;  Service: Endoscopy;  Laterality: N/A;  TRACHEOSTOMY TUBE PLACEMENT  11/28/2010   Procedure: TRACHEOSTOMY;  Surgeon: Susy Frizzle, MD;  Location: [facility] OR;  Service: ENT;  Laterality: N/A;    Allergies: Patient has no known allergies.  Medications: Prior to Admission medications   Medication Sig Start  Date End Date Taking? Authorizing Provider  acetaminophen (TYLENOL) 500 MG tablet Take 1,000 mg by mouth daily as needed for moderate pain, fever or headache.    [provider]  Alcohol Swabs (ALCOHOL PREP) 70 % PADS Use to test blood sugar daily. Dx: E11.21 09/11/21   Frederica Kuster, MD  apixaban (ELIQUIS) 5 MG TABS tablet Take 1 tablet (5 mg total) by mouth 2 (two) times daily. 03/30/22   Nahser, Deloris Ping, MD  blood glucose meter kit and supplies Dispense based on patient and insurance preference. Use up to four times daily as directed. (FOR ICD-10 E10.9, E11.9). 07/14/21   Frederica Kuster, MD  carvedilol (COREG) 25 MG tablet Take 1 tablet (25 mg total) by mouth 2 (two) times daily with a meal. Needs an appointment before anymore future refills. 08/18/22   Frederica Kuster, MD  ciprofloxacin (CIPRO) 500 MG tablet Take 1 tablet (500 mg total) by mouth 2 (two) times daily. 08/25/22   Allred, Darrell K, PA-C  diltiazem (CARDIZEM CD) 180 MG 24 hr capsule TAKE 2 CAPSULES BY MOUTH  DAILY AT NOON 05/12/22   Swinyer, Zachary George, NP  empagliflozin (JARDIANCE) 10 MG TABS tablet Take 1 tablet (10 mg total) by mouth daily. 02/02/22   Frederica Kuster, MD  finasteride (PROSCAR) 5 MG tablet Take 1 tablet (5 mg total) by mouth daily. 04/17/22   Ghimire, Werner Lean, MD  glucose blood (ONETOUCH ULTRA) test strip 1 each by Other route daily. Dx: E11.21 04/15/22   Frederica Kuster, MD  isosorbide mononitrate (IMDUR) 30 MG 24 hr tablet Take 1 tablet (30 mg total) by mouth daily. 04/15/22   Frederica Kuster, MD  losartan (COZAAR) 25 MG tablet Take 1 tablet (25 mg total) by mouth daily. 12/18/20   Nahser, Deloris Ping, MD  metFORMIN (GLUCOPHAGE) 850 MG tablet Take 1 tablet (850 mg total) by mouth daily with breakfast. 02/02/22   Frederica Kuster, MD  methylPREDNISolone (MEDROL DOSEPAK) 4 MG TBPK tablet Take as directed per package directions. 08/25/22   Allred, Darrell K, PA-C  Multiple Vitamins-Minerals (MENS ONE DAILY PO)  Take 1 tablet by mouth daily.    [provider]  nitrofurantoin, macrocrystal-monohydrate, (MACROBID) 100 MG capsule Take 1 capsule (100 mg total) by mouth 2 (two) times daily. 08/11/22   Ngetich, Donalee Citrin, NP  ONETOUCH DELICA LANCETS 33G MISC Use as directed to test blood sugar once daily 12/06/17   Marcine Matar, MD  pantoprazole (PROTONIX) 40 MG tablet Take 1 tablet (40 mg total) by mouth daily. Patient taking differently: Take 40 mg by mouth daily with supper. 12/08/21   Frederica Kuster, MD  phenazopyridine (PYRIDIUM) 100 MG tablet Take 1 tablet (100 mg total) by mouth 3 (three) times daily as needed for pain. 08/25/22   Allred, Darrell K, PA-C  pravastatin (PRAVACHOL) 40 MG tablet Take 1 tablet (40 mg total) by mouth daily. Patient taking differently: Take 40 mg by mouth at bedtime. 02/02/22   Frederica Kuster, MD  sitaGLIPtin (JANUVIA) 50 MG tablet Take 1 tablet (50 mg total) by mouth daily. 05/13/22   Frederica Kuster, MD  solifenacin (VESICARE) 5 MG tablet Take 1 tablet (5 mg total)  by mouth daily. 08/25/22   Allred, Rosalita Levan, PA-C  spironolactone (ALDACTONE) 25 MG tablet Take 1 tablet (25 mg total) by mouth daily. 09/01/22   Ngetich, Dinah C, NP  tamsulosin (FLOMAX) 0.4 MG CAPS capsule Take 0.4 mg by mouth at bedtime.    [provider]     Family History  Problem Relation Age of Onset   Diabetes Mother    Diabetes Brother    Colon cancer Neg Hx    Colon polyps Neg Hx    Kidney disease Neg Hx    Esophageal cancer Neg Hx    Heart disease Neg Hx    Gallbladder disease Neg Hx     Social History   Socioeconomic History   Marital status: Single    Spouse name: Not on file   Number of children: 0   Years of education: Not on file   Highest education level: Not on file  Occupational History   Occupation: Disability  Tobacco Use   Smoking status: Former    Current packs/day: 0.00    Types: Cigarettes    Quit date: 12/19/2005    Years since quitting: 16.8    Smokeless tobacco: Never  Vaping Use   Vaping status: Never Used  Substance and Sexual Activity   Alcohol use: No    Alcohol/week: 11.0 standard drinks of alcohol    Types: 6 Glasses of wine, 5 Shots of liquor per week    Comment: quit in 2012   Drug use: No   Sexual activity: Not on file  Other Topics Concern   Not on file  Social History Narrative   Diet: No      Caffeine: Yes      Married, if yes what year: No      Do you live in a house, apartment, assisted living, condo, trailer, ect: House      Is it one or more stories: No      How many persons live in your home? One      Pets: No      Highest level or education completed: 4 year college      Current/Past profession: Business       Exercise: Yes                 Type and how often: Every other day         Living Will: Yes   DNR: No   POA/HPOA: Yes      Functional Status:   Do you have difficulty bathing or dressing yourself? No   Do you have difficulty preparing food or eating? No   Do you have difficulty managing your medications? No   Do you have difficulty managing your finances? No   Do you have difficulty affording your medications? No   Social Determinants of Health   Financial Resource Strain: Not on file  Food Insecurity: No Food Insecurity (04/20/2022)   Hunger Vital Sign    Worried About Running Out of Food in the Last Year: Never true    Ran Out of Food in the Last Year: Never true  Transportation Needs: Unmet Transportation Needs (04/22/2022)   PRAPARE - Administrator, Civil Service (Medical): Yes    Lack of Transportation (Non-Medical): Yes  Physical Activity: Not on file  Stress: Not on file  Social Connections: Not on file     Vital Signs: There were no vitals taken for this visit.  Physical Exam Deferred secondary to  virtual visit.  Imaging:  PAE, 08/25/22 Bilateral prostatic artery microparticle embolization      Labs:  CBC: Recent Labs    04/14/22 0339  04/23/22 1458 08/05/22 1203 08/25/22 0800  WBC 11.9* 8.0 6.0 5.0  HGB 13.8 13.2 14.2 14.0  HCT 40.5 40.0 43.7 44.4  PLT 226 415* 244 208    COAGS: Recent Labs    08/25/22 0800  INR 1.4*    BMP: Recent Labs    04/14/22 0339 04/15/22 0923 04/16/22 0802 04/23/22 1458 08/05/22 1203 08/25/22 0800  NA 131* 137 139 137 136 136  K 4.3 3.7 3.8 3.9 5.2 4.5  CL 100 102 108 99 99 101  CO2 17* 21* 21* 26 28 26   GLUCOSE 175* 172* 176* 106* 123 104*  BUN 78* 45* 33* 28* 25 23  CALCIUM 9.2 9.4 9.1 10.0 10.4* 10.1  CREATININE 5.20* 2.37* 1.62* 1.33 1.53* 1.35*  GFRNONAA 11* 29* 46*  --   --  57*     Assessment and Plan:  Barry DERRALL BRACCIA is a 67 y.o. male comorbid w PMHx significant for HTN, CKD 3, CHF and prior CVA in 2012 w residual paresis. Relevant Hx of acute bladder outlet obstruction secondary to enlarged prostate with ER visit on 04/13/22 requiring foley catheter placement, with unsuccessful subsequent voiding trials.  Prostatomegaly (~130 mL) and foley catheter dependence, s/p PAE on 08/25/22.  PSA, last 4.45 (08/25/22), previously 5.7 (08/2020). Prostate Bx, Negative (11/07/20) Post PAE IPSS Score Total, 2 Pts (mild), pre 14 Pts (moderate) QoL, markedly improved. Remains on tamsulosin 0.4 mg QD and finasteride 5 mg every day Foley catheter successfully removed 2 wks post op on 09/10/22.   *Pt is very happy with the prostate embolization procedural result and his current urinary state.  *No concern at this time. Follow up with VIR PRN. *Continue routine Urological follow up.  Thank you for allowing [place] to participate in the care of this Patient.  Electronically Signed:  Roanna Banning, MD [facility] Radiology   Pager. [phone] Clinic. [phone]  I spent a total of 25 Minutes of non-face-to-face time in clinical consultation, greater than 50% of which was counseling/coordinating care for Barry [name] [facility]  re-evaluation for BPH  and LUTS.

## 2022-10-06 ENCOUNTER — Other Ambulatory Visit: Payer: Self-pay

## 2022-10-06 DIAGNOSIS — I482 Chronic atrial fibrillation, unspecified: Secondary | ICD-10-CM

## 2022-10-06 MED ORDER — APIXABAN 5 MG PO TABS: 5.0000 mg | ORAL_TABLET | Freq: Two times a day (BID) | ORAL | 1 refills | Status: DC

## 2022-10-06 NOTE — Telephone Encounter (Signed)
Prescription refill request for Eliquis received. Indication: Afib  Last office visit: 01/28/22 (Swinyer)  Scr: 1.35 (08/25/22)  Age: 66 Weight: 84.9kg  Appropriate dose. Refill sent.

## 2022-10-13 DIAGNOSIS — E119 Type 2 diabetes mellitus without complications: Secondary | ICD-10-CM | POA: Diagnosis not present

## 2022-10-16 DIAGNOSIS — R338 Other retention of urine: Secondary | ICD-10-CM | POA: Diagnosis not present

## 2022-10-16 DIAGNOSIS — R972 Elevated prostate specific antigen [PSA]: Secondary | ICD-10-CM | POA: Diagnosis not present

## 2022-10-16 DIAGNOSIS — N401 Enlarged prostate with lower urinary tract symptoms: Secondary | ICD-10-CM | POA: Diagnosis not present

## 2022-11-10 ENCOUNTER — Other Ambulatory Visit: Payer: Self-pay

## 2022-11-10 MED ORDER — PRAVASTATIN SODIUM 40 MG PO TABS: 40.0000 mg | ORAL_TABLET | Freq: Every day | ORAL | 1 refills | Status: DC

## 2022-11-12 NOTE — Addendum Note (Signed)
Encounter addended by: Arby Barrette on: 11/12/2022 12:13 PM  Actions taken: Imaging Exam ended

## 2022-11-13 DIAGNOSIS — E119 Type 2 diabetes mellitus without complications: Secondary | ICD-10-CM | POA: Diagnosis not present

## 2022-11-16 ENCOUNTER — Other Ambulatory Visit: Payer: Self-pay | Admitting: *Deleted

## 2022-11-16 MED ORDER — METFORMIN HCL 850 MG PO TABS: 850.0000 mg | ORAL_TABLET | Freq: Every day | ORAL | 1 refills | Status: DC

## 2022-11-16 MED ORDER — ALCOHOL PREP 70 % PADS: MEDICATED_PAD | 3 refills | Status: AC

## 2022-11-16 NOTE — Telephone Encounter (Signed)
Patient called requesting Rx's

## 2022-11-18 ENCOUNTER — Emergency Department (HOSPITAL_COMMUNITY): Payer: Medicare Other

## 2022-11-18 ENCOUNTER — Encounter: Payer: Self-pay | Admitting: Family

## 2022-11-18 ENCOUNTER — Ambulatory Visit (INDEPENDENT_AMBULATORY_CARE_PROVIDER_SITE_OTHER): Payer: Medicare Other | Admitting: Family

## 2022-11-18 ENCOUNTER — Encounter (HOSPITAL_COMMUNITY): Payer: Self-pay | Admitting: *Deleted

## 2022-11-18 ENCOUNTER — Other Ambulatory Visit: Payer: Self-pay

## 2022-11-18 ENCOUNTER — Inpatient Hospital Stay (HOSPITAL_COMMUNITY)
Admission: EM | Admit: 2022-11-18 | Discharge: 2022-11-22 | DRG: 389 | Disposition: A | Discharge status: Home / Self Care | Payer: Medicare Other | Attending: Internal Medicine | Admitting: Internal Medicine

## 2022-11-18 VITALS — BP 118/80 | HR 78 | Temp 96.2°F | Resp 19 | Ht 71.0 in | Wt 192.0 lb

## 2022-11-18 DIAGNOSIS — I5032 Chronic diastolic (congestive) heart failure: Secondary | ICD-10-CM | POA: Diagnosis not present

## 2022-11-18 DIAGNOSIS — I69354 Hemiplegia and hemiparesis following cerebral infarction affecting left non-dominant side: Secondary | ICD-10-CM | POA: Diagnosis not present

## 2022-11-18 DIAGNOSIS — R140 Abdominal distension (gaseous): Secondary | ICD-10-CM | POA: Diagnosis not present

## 2022-11-18 DIAGNOSIS — I130 Hypertensive heart and chronic kidney disease with heart failure and stage 1 through stage 4 chronic kidney disease, or unspecified chronic kidney disease: Secondary | ICD-10-CM | POA: Diagnosis not present

## 2022-11-18 DIAGNOSIS — N1832 Chronic kidney disease, stage 3b: Secondary | ICD-10-CM | POA: Diagnosis present

## 2022-11-18 DIAGNOSIS — K562 Volvulus: Secondary | ICD-10-CM | POA: Diagnosis not present

## 2022-11-18 DIAGNOSIS — E785 Hyperlipidemia, unspecified: Secondary | ICD-10-CM | POA: Diagnosis not present

## 2022-11-18 DIAGNOSIS — N400 Enlarged prostate without lower urinary tract symptoms: Secondary | ICD-10-CM | POA: Diagnosis present

## 2022-11-18 DIAGNOSIS — N179 Acute kidney failure, unspecified: Secondary | ICD-10-CM | POA: Diagnosis not present

## 2022-11-18 DIAGNOSIS — K641 Second degree hemorrhoids: Secondary | ICD-10-CM | POA: Diagnosis not present

## 2022-11-18 DIAGNOSIS — E871 Hypo-osmolality and hyponatremia: Secondary | ICD-10-CM | POA: Diagnosis not present

## 2022-11-18 DIAGNOSIS — Q438 Other specified congenital malformations of intestine: Secondary | ICD-10-CM | POA: Diagnosis not present

## 2022-11-18 DIAGNOSIS — Z833 Family history of diabetes mellitus: Secondary | ICD-10-CM

## 2022-11-18 DIAGNOSIS — E119 Type 2 diabetes mellitus without complications: Secondary | ICD-10-CM | POA: Diagnosis not present

## 2022-11-18 DIAGNOSIS — N281 Cyst of kidney, acquired: Secondary | ICD-10-CM | POA: Diagnosis not present

## 2022-11-18 DIAGNOSIS — I2510 Atherosclerotic heart disease of native coronary artery without angina pectoris: Secondary | ICD-10-CM | POA: Diagnosis not present

## 2022-11-18 DIAGNOSIS — M109 Gout, unspecified: Secondary | ICD-10-CM | POA: Diagnosis present

## 2022-11-18 DIAGNOSIS — K644 Residual hemorrhoidal skin tags: Secondary | ICD-10-CM | POA: Diagnosis present

## 2022-11-18 DIAGNOSIS — Z8709 Personal history of other diseases of the respiratory system: Secondary | ICD-10-CM

## 2022-11-18 DIAGNOSIS — M1990 Unspecified osteoarthritis, unspecified site: Secondary | ICD-10-CM | POA: Diagnosis present

## 2022-11-18 DIAGNOSIS — R1084 Generalized abdominal pain: Secondary | ICD-10-CM | POA: Diagnosis not present

## 2022-11-18 DIAGNOSIS — E1122 Type 2 diabetes mellitus with diabetic chronic kidney disease: Secondary | ICD-10-CM | POA: Diagnosis present

## 2022-11-18 DIAGNOSIS — Z7984 Long term (current) use of oral hypoglycemic drugs: Secondary | ICD-10-CM

## 2022-11-18 DIAGNOSIS — K5939 Other megacolon: Secondary | ICD-10-CM | POA: Diagnosis not present

## 2022-11-18 DIAGNOSIS — K5901 Slow transit constipation: Secondary | ICD-10-CM

## 2022-11-18 DIAGNOSIS — I509 Heart failure, unspecified: Secondary | ICD-10-CM | POA: Diagnosis not present

## 2022-11-18 DIAGNOSIS — K6389 Other specified diseases of intestine: Secondary | ICD-10-CM | POA: Diagnosis not present

## 2022-11-18 DIAGNOSIS — Z7901 Long term (current) use of anticoagulants: Secondary | ICD-10-CM

## 2022-11-18 DIAGNOSIS — Z79899 Other long term (current) drug therapy: Secondary | ICD-10-CM

## 2022-11-18 DIAGNOSIS — Z9049 Acquired absence of other specified parts of digestive tract: Secondary | ICD-10-CM

## 2022-11-18 DIAGNOSIS — I4821 Permanent atrial fibrillation: Secondary | ICD-10-CM | POA: Diagnosis present

## 2022-11-18 DIAGNOSIS — Z8639 Personal history of other endocrine, nutritional and metabolic disease: Secondary | ICD-10-CM

## 2022-11-18 DIAGNOSIS — N183 Chronic kidney disease, stage 3 unspecified: Secondary | ICD-10-CM | POA: Diagnosis not present

## 2022-11-18 DIAGNOSIS — E876 Hypokalemia: Secondary | ICD-10-CM | POA: Diagnosis not present

## 2022-11-18 DIAGNOSIS — K219 Gastro-esophageal reflux disease without esophagitis: Secondary | ICD-10-CM | POA: Diagnosis present

## 2022-11-18 DIAGNOSIS — Z8679 Personal history of other diseases of the circulatory system: Secondary | ICD-10-CM

## 2022-11-18 DIAGNOSIS — Z9889 Other specified postprocedural states: Secondary | ICD-10-CM

## 2022-11-18 DIAGNOSIS — Z87891 Personal history of nicotine dependence: Secondary | ICD-10-CM

## 2022-11-18 LAB — CBC
HCT: 47 % (ref 39.0–52.0)
Hemoglobin: 15.4 g/dL (ref 13.0–17.0)
MCH: 29.6 pg (ref 26.0–34.0)
MCHC: 32.8 g/dL (ref 30.0–36.0)
MCV: 90.2 fL (ref 80.0–100.0)
Platelets: 263 10*3/uL (ref 150–400)
RBC: 5.21 MIL/uL (ref 4.22–5.81)
RDW: 13.7 % (ref 11.5–15.5)
WBC: 6.8 10*3/uL (ref 4.0–10.5)
nRBC: 0 % (ref 0.0–0.2)

## 2022-11-18 LAB — COMPREHENSIVE METABOLIC PANEL
ALT: 31 U/L (ref 0–44)
AST: 24 U/L (ref 15–41)
Albumin: 3.9 g/dL (ref 3.5–5.0)
Alkaline Phosphatase: 55 U/L (ref 38–126)
Anion gap: 11 (ref 5–15)
BUN: 26 mg/dL — ABNORMAL HIGH (ref 8–23)
CO2: 21 mmol/L — ABNORMAL LOW (ref 22–32)
Calcium: 9.8 mg/dL (ref 8.9–10.3)
Chloride: 101 mmol/L (ref 98–111)
Creatinine, Ser: 1.59 mg/dL — ABNORMAL HIGH (ref 0.61–1.24)
GFR, Estimated: 47 mL/min — ABNORMAL LOW (ref >60)
Glucose, Bld: 119 mg/dL — ABNORMAL HIGH (ref 70–99)
Potassium: 3 mmol/L — ABNORMAL LOW (ref 3.5–5.1)
Sodium: 133 mmol/L — ABNORMAL LOW (ref 135–145)
Total Bilirubin: 0.9 mg/dL (ref <1.2)
Total Protein: 7.3 g/dL (ref 6.5–8.1)

## 2022-11-18 LAB — LIPASE, BLOOD: Lipase: 26 U/L (ref 11–51)

## 2022-11-18 MED ORDER — IOHEXOL 350 MG/ML SOLN
60.0000 mL | Freq: Once | INTRAVENOUS | Status: AC | PRN
  Administered 2022-11-18: 60 mL via INTRAVENOUS

## 2022-11-18 MED ORDER — FENTANYL CITRATE PF 50 MCG/ML IJ SOSY
50.0000 ug | PREFILLED_SYRINGE | Freq: Once | INTRAMUSCULAR | Status: AC
  Administered 2022-11-18: 50 ug via INTRAVENOUS
  Filled 2022-11-18: qty 1

## 2022-11-18 NOTE — ED Notes (Signed)
Patient transported to CT 

## 2022-11-18 NOTE — ED Triage Notes (Signed)
He pt was seen at his doctors office earlier today and they sent him here for treatment

## 2022-11-18 NOTE — ED Triage Notes (Signed)
The pt has had abd pain since Friday no n v or diarrhea  his last bm was last Friday also

## 2022-11-18 NOTE — Progress Notes (Unsigned)
Provider: Sheylin Scharnhorst FNP-C  Venita Sheffield, MD  Patient Care Team: Venita Sheffield, MD as PCP - General (Internal Medicine) Nahser, Deloris Ping, MD as PCP - Cardiology (Cardiology) Darci Needle, MD (Endocrinology) Delorise Shiner, CPhT as [facility] Care Management (Pharmacy Technician)  Extended Emergency Contact Information Primary Emergency Contact: Cale,[name] Address: [address]          [place] Home Phone: [phone] Mobile Phone: [phone] Relation: Brother Secondary Emergency Contact: Theodoro [place] Home Phone: [phone] Relation: Friend  Code Status:  Full Code  Goals of care: Advanced Directive information    11/18/2022    3:39 PM  Advanced Directives  Does Patient Have a Medical Advance Directive? Yes  Type of Advance Directive Living will  Does patient want to make changes to medical advance directive? No - Patient declined     Chief Complaint  Patient presents with   Medical Management of Chronic Issues    Upset stomach    HPI:  Pt is a 67 y.o. male seen today for an acute visit for evaluation of  He complains of bloating,Abdominal pain, Had oatmeal and a banana.  Past Medical History:  Diagnosis Date   A-fib (HCC) 11/19/2010   Acute exacerbation of congestive heart failure (HCC) 11/19/2010   Arthritis    CAD (coronary artery disease) 11/20/2010   CHF (congestive heart failure) (HCC)    CKD (chronic kidney disease) stage 3, GFR 30-59 ml/min (HCC) 05/04/2017   Coronary artery disease    Diabetes type 2, controlled (HCC) 11/19/2010   GERD (gastroesophageal reflux disease) 02/08/2015   Gout    Gout 11/20/2010   History of cerebrovascular accident (CVA) with residual deficit 05/04/2017   History of CVA (cerebrovascular accident) 11/19/2010   HTN (hypertension) 07/01/2012   Hyperlipemia 11/21/2010   Hypertension    Hypertensive emergency  11/19/2010   ICH (intracerebral hemorrhage) (HCC) 12/22/2010   Physical deconditioning 12/22/2010   Pulmonary edema 11/19/2010   Respiratory failure (HCC) 11/19/2010   Shortness of breath    Stroke (HCC) 11/19/2010   Thyroiditis 11/20/2010   Past Surgical History:  Procedure Laterality Date   IR ANGIOGRAM PELVIS SELECTIVE OR SUPRASELECTIVE  08/25/2022   IR ANGIOGRAM SELECTIVE EACH ADDITIONAL VESSEL  08/25/2022   IR ANGIOGRAM SELECTIVE EACH ADDITIONAL VESSEL  08/25/2022   IR EMBO TUMOR ORGAN ISCHEMIA INFARCT INC GUIDE ROADMAPPING  08/25/2022   IR RADIOLOGIST EVAL & MGMT  07/13/2022   IR RADIOLOGIST EVAL & MGMT  09/25/2022   IR US GUIDE VASC ACCESS LEFT  08/25/2022   IR US GUIDE VASC ACCESS LEFT  08/25/2022   IR US GUIDE VASC ACCESS RIGHT  08/25/2022   PEG PLACEMENT  12/03/2010   Procedure: PERCUTANEOUS ENDOSCOPIC GASTROSTOMY (PEG) PLACEMENT;  Surgeon: Hart Carwin, MD;  Location: [facility] ENDOSCOPY;  Service: Endoscopy;  Laterality: N/A;   TRACHEOSTOMY TUBE PLACEMENT  11/28/2010   Procedure: TRACHEOSTOMY;  Surgeon: Susy Frizzle, MD;  Location: [facility] OR;  Service: ENT;  Laterality: N/A;    No Known Allergies  Outpatient Encounter Medications as of 11/18/2022  Medication Sig   acetaminophen (TYLENOL) 500 MG tablet Take 1,000 mg by mouth daily as needed for moderate pain, fever or headache.   Alcohol Swabs (ALCOHOL PREP) 70 % PADS Use to test blood sugar daily. Dx: E11.21   apixaban (ELIQUIS) 5 MG TABS tablet Take 1 tablet (5 mg total) by mouth 2 (two) times daily.  blood glucose meter kit and supplies Dispense based on patient and insurance preference. Use up to four times daily as directed. (FOR ICD-10 E10.9, E11.9).   carvedilol (COREG) 25 MG tablet Take 1 tablet (25 mg total) by mouth 2 (two) times daily with a meal. Needs an appointment before anymore future refills.   diltiazem (CARDIZEM CD) 180 MG 24 hr capsule TAKE 2 CAPSULES BY MOUTH  DAILY AT NOON   empagliflozin (JARDIANCE) 10 MG TABS  tablet Take 1 tablet (10 mg total) by mouth daily.   finasteride (PROSCAR) 5 MG tablet Take 1 tablet (5 mg total) by mouth daily.   glucose blood (ONETOUCH ULTRA) test strip 1 each by Other route daily. Dx: E11.21   isosorbide mononitrate (IMDUR) 30 MG 24 hr tablet Take 1 tablet (30 mg total) by mouth daily.   metFORMIN (GLUCOPHAGE) 850 MG tablet Take 1 tablet (850 mg total) by mouth daily with breakfast.   Multiple Vitamins-Minerals (MENS ONE DAILY PO) Take 1 tablet by mouth daily.   ONETOUCH DELICA LANCETS 33G MISC Use as directed to test blood sugar once daily   pantoprazole (PROTONIX) 40 MG tablet Take 1 tablet (40 mg total) by mouth daily. (Patient taking differently: Take 40 mg by mouth daily with supper.)   solifenacin (VESICARE) 5 MG tablet Take 1 tablet (5 mg total) by mouth daily.   spironolactone (ALDACTONE) 25 MG tablet Take 1 tablet (25 mg total) by mouth daily.   tamsulosin (FLOMAX) 0.4 MG CAPS capsule Take 0.4 mg by mouth at bedtime.   ciprofloxacin (CIPRO) 500 MG tablet Take 1 tablet (500 mg total) by mouth 2 (two) times daily. (Patient not taking: Reported on 11/18/2022)   losartan (COZAAR) 25 MG tablet Take 1 tablet (25 mg total) by mouth daily. (Patient not taking: Reported on 11/18/2022)   methylPREDNISolone (MEDROL DOSEPAK) 4 MG TBPK tablet Take as directed per package directions. (Patient not taking: Reported on 11/18/2022)   nitrofurantoin, macrocrystal-monohydrate, (MACROBID) 100 MG capsule Take 1 capsule (100 mg total) by mouth 2 (two) times daily. (Patient not taking: Reported on 11/18/2022)   phenazopyridine (PYRIDIUM) 100 MG tablet Take 1 tablet (100 mg total) by mouth 3 (three) times daily as needed for pain. (Patient not taking: Reported on 11/18/2022)   pravastatin (PRAVACHOL) 40 MG tablet Take 1 tablet (40 mg total) by mouth at bedtime. (Patient not taking: Reported on 11/18/2022)   sitaGLIPtin (JANUVIA) 50 MG tablet Take 1 tablet (50 mg total) by mouth daily. (Patient not  taking: Reported on 11/18/2022)   No facility-administered encounter medications on file as of 11/18/2022.    Review of Systems  Immunization History  Administered Date(s) Administered   Fluad Quad(high Dose 65+) 11/19/2020, 11/26/2021   Influenza Whole 11/21/2010   Influenza,inj,Quad PF,6+ Mos 12/07/2012, 11/27/2013, 02/08/2015, 11/05/2016, 10/29/2017   PFIZER(Purple Top)SARS-COV-2 Vaccination 04/13/2019, 05/08/2019, 10/18/2019   PNEUMOCOCCAL CONJUGATE-20 08/05/2022   Pneumococcal Polysaccharide-23 11/21/2010   Zoster Recombinant(Shingrix) 06/19/2015   Pertinent  Health Maintenance Due  Topic Date Due   Colonoscopy  02/01/2019   INFLUENZA VACCINE  08/13/2022   HEMOGLOBIN A1C  02/05/2023   FOOT EXAM  03/12/2023   OPHTHALMOLOGY EXAM  09/08/2023      05/18/2021   10:19 AM 11/26/2021   12:45 PM 04/01/2022    1:57 PM 04/09/2022   10:43 AM 11/18/2022    3:38 PM  Fall Risk  Falls in the past year?  0 0 0 0  Was there an injury with Fall?  0 0 0  Fall Risk Category Calculator  0 0 0   Fall Risk Category (Retired)  Low     (RETIRED) Patient Fall Risk Level Low fall risk Low fall risk     Patient at Risk for Falls Due to  No Fall Risks No Fall Risks No Fall Risks No Fall Risks  Fall risk Follow up  Falls evaluation completed Falls evaluation completed     Functional Status Survey:    Vitals:   11/18/22 1553  BP: 118/80  Pulse: 78  Resp: 19  Temp: (!) 96.2 F (35.7 C)  SpO2: 97%  Weight: 192 lb (87.1 kg)  Height: 5\' 11"  (1.803 m)   Body mass index is 26.78 kg/m. Physical Exam  Labs reviewed: Recent Labs    04/13/22 1522 04/13/22 1907 04/14/22 0339 04/15/22 0923 04/23/22 1458 08/05/22 1203 08/25/22 0800  NA 132*   < > 131*   < > 137 136 136  K 5.1   < > 4.3   < > 3.9 5.2 4.5  CL 96*   < > 100   < > 99 99 101  CO2 19*   < > 17*   < > 26 28 26   GLUCOSE 209*   < > 175*   < > 106* 123 104*  BUN 98*   < > 78*   < > 28* 25 23  CREATININE 7.69*   < > 5.20*   < >  1.33 1.53* 1.35*  CALCIUM 9.6   < > 9.2   < > 10.0 10.4* 10.1  MG 2.5*  --  2.3  --   --   --   --   PHOS 5.0*  --   --   --   --   --   --    < > = values in this interval not displayed.   Recent Labs    04/13/22 0756 04/23/22 1458 08/05/22 1203  AST 22 18 12   ALT 16 19 8*  ALKPHOS 56  --   --   BILITOT 0.5 0.4 0.5  PROT 7.4 7.0 7.4  ALBUMIN 3.2*  --   --    Recent Labs    04/23/22 1458 08/05/22 1203 08/25/22 0800  WBC 8.0 6.0 5.0  NEUTROABS 4,104 2,640 2.2  HGB 13.2 14.2 14.0  HCT 40.0 43.7 44.4  MCV 88.5 91.2 94.1  PLT 415* 244 208   Lab Results  Component Value Date   TSH 3.63 08/05/2022   Lab Results  Component Value Date   HGBA1C 6.9 (H) 08/05/2022   Lab Results  Component Value Date   CHOL 130 08/05/2022   HDL 56 08/05/2022   LDLCALC 54 08/05/2022   TRIG 121 08/05/2022   CHOLHDL 2.3 08/05/2022    Significant Diagnostic Results in last 30 days:  No results found.  Assessment/Plan There are no diagnoses linked to this encounter.   Family/ staff Communication: Reviewed plan of care with patient  Labs/tests ordered: None   Next Appointment:   Caesar Bookman, NP

## 2022-11-18 NOTE — Patient Instructions (Signed)
Please go to the Emergency room for evaluation of abdominal distention and bloating and constipation

## 2022-11-18 NOTE — ED Provider Notes (Signed)
[place] EMERGENCY DEPARTMENT AT [facility] Provider Note   CSN: [id number] Arrival date & time: 11/18/22  1648     History {Add pertinent medical, surgical, social history, OB history to HPI:1} Chief Complaint  Patient presents with   Abdominal Pain    [name] is a 67 y.o. male.  The history is provided by the patient and medical records.  Abdominal Pain  67 y.o. M with hx of coronary artery disease, CHF, CKD, diabetes, GERD, gout, hypertension, hyperlipidemia, permanent A-fib on Eliquis, presenting to the ED with abdominal distention.  States symptoms began on Friday, ongoing since then.  Reports generalized abdominal pain and distention.  States he feels like a lot of "pressure" in his abdomen.  Only able to eat very small amounts. He has not had any nausea or vomiting.  States she did have small BM today but it was mostly liquid stool.  Last normal BM was on Friday.  He is not having any difficulty urinating.  No fever or chills.  Has had prior PEG placement with takedown as well as appendectomy.  Home Medications Prior to Admission medications   Medication Sig Start Date End Date Taking? Authorizing Provider  acetaminophen (TYLENOL) 500 MG tablet Take 1,000 mg by mouth daily as needed for moderate pain, fever or headache.    [provider]  Alcohol Swabs (ALCOHOL PREP) 70 % PADS Use to test blood sugar daily. Dx: E11.21 11/16/22   Venita Sheffield, MD  apixaban (ELIQUIS) 5 MG TABS tablet Take 1 tablet (5 mg total) by mouth 2 (two) times daily. 10/06/22   Nahser, Deloris Ping, MD  blood glucose meter kit and supplies Dispense based on patient and insurance preference. Use up to four times daily as directed. (FOR ICD-10 E10.9, E11.9). 07/14/21   Frederica Kuster, MD  carvedilol (COREG) 25 MG tablet Take 1 tablet (25 mg total) by mouth 2 (two) times daily with a meal. Needs an appointment before anymore future refills. 08/18/22   Frederica Kuster, MD   ciprofloxacin (CIPRO) 500 MG tablet Take 1 tablet (500 mg total) by mouth 2 (two) times daily. Patient not taking: Reported on 11/18/2022 08/25/22   Allred, Darrell K, PA-C  diltiazem (CARDIZEM CD) 180 MG 24 hr capsule TAKE 2 CAPSULES BY MOUTH  DAILY AT NOON 05/12/22   Swinyer, Zachary George, NP  empagliflozin (JARDIANCE) 10 MG TABS tablet Take 1 tablet (10 mg total) by mouth daily. 02/02/22   Frederica Kuster, MD  finasteride (PROSCAR) 5 MG tablet Take 1 tablet (5 mg total) by mouth daily. 04/17/22   Ghimire, Werner Lean, MD  glucose blood (ONETOUCH ULTRA) test strip 1 each by Other route daily. Dx: E11.21 04/15/22   Frederica Kuster, MD  isosorbide mononitrate (IMDUR) 30 MG 24 hr tablet Take 1 tablet (30 mg total) by mouth daily. 04/15/22   Frederica Kuster, MD  losartan (COZAAR) 25 MG tablet Take 1 tablet (25 mg total) by mouth daily. Patient not taking: Reported on 11/18/2022 12/18/20   Nahser, Deloris Ping, MD  metFORMIN (GLUCOPHAGE) 850 MG tablet Take 1 tablet (850 mg total) by mouth daily with breakfast. 11/16/22   Venita Sheffield, MD  methylPREDNISolone (MEDROL DOSEPAK) 4 MG TBPK tablet Take as directed per package directions. Patient not taking: Reported on 11/18/2022 08/25/22   Allred, Darrell K, PA-C  Multiple Vitamins-Minerals (MENS ONE DAILY PO) Take 1 tablet by mouth daily.    [provider]  nitrofurantoin, macrocrystal-monohydrate, (MACROBID) 100  MG capsule Take 1 capsule (100 mg total) by mouth 2 (two) times daily. Patient not taking: Reported on 11/18/2022 08/11/22   Ngetich, Donalee Citrin, NP  [facility] DELICA LANCETS 33G MISC Use as directed to test blood sugar once daily 12/06/17   Marcine Matar, MD  pantoprazole (PROTONIX) 40 MG tablet Take 1 tablet (40 mg total) by mouth daily. Patient taking differently: Take 40 mg by mouth daily with supper. 12/08/21   Frederica Kuster, MD  phenazopyridine (PYRIDIUM) 100 MG tablet Take 1 tablet (100 mg total) by mouth 3 (three) times daily as  needed for pain. Patient not taking: Reported on 11/18/2022 08/25/22   Allred, Darrell K, PA-C  pravastatin (PRAVACHOL) 40 MG tablet Take 1 tablet (40 mg total) by mouth at bedtime. Patient not taking: Reported on 11/18/2022 11/10/22   Venita Sheffield, MD  sitaGLIPtin (JANUVIA) 50 MG tablet Take 1 tablet (50 mg total) by mouth daily. Patient not taking: Reported on 11/18/2022 05/13/22   Frederica Kuster, MD  solifenacin (VESICARE) 5 MG tablet Take 1 tablet (5 mg total) by mouth daily. 08/25/22   Allred, Rosalita Levan, PA-C  spironolactone (ALDACTONE) 25 MG tablet Take 1 tablet (25 mg total) by mouth daily. 09/01/22   Ngetich, Dinah C, NP  tamsulosin (FLOMAX) 0.4 MG CAPS capsule Take 0.4 mg by mouth at bedtime.    [provider]      Allergies    Patient has no known allergies.    Review of Systems   Review of Systems  Gastrointestinal:  Positive for abdominal pain.  All other systems reviewed and are negative.   Physical Exam Updated Vital Signs BP 107/86 (BP Location: Right Arm)   Pulse 71   Temp 97.9 F (36.6 C)   Resp 18   Ht 5\' 11"  (1.803 m)   Wt 87.1 kg   SpO2 98%   BMI 26.78 kg/m   Physical Exam Vitals and nursing note reviewed.  Constitutional:      Appearance: He is well-developed.  HENT:     Head: Normocephalic and atraumatic.  Eyes:     Conjunctiva/sclera: Conjunctivae normal.     Pupils: Pupils are equal, round, and reactive to light.  Cardiovascular:     Rate and Rhythm: Normal rate and regular rhythm.     Heart sounds: Normal heart sounds.  Pulmonary:     Effort: Pulmonary effort is normal.     Breath sounds: Normal breath sounds.  Abdominal:     General: Bowel sounds are normal. There is distension.     Palpations: Abdomen is soft.     Comments: Significant abdominal distention, decreased bowel sounds audible, no focal tenderness, generalized pain endorsed  Musculoskeletal:        General: Normal range of motion.     Cervical back: Normal range  of motion.  Skin:    General: Skin is warm and dry.  Neurological:     Mental Status: He is alert and oriented to person, place, and time.     ED Results / Procedures / Treatments   Labs (all labs ordered are listed, but only abnormal results are displayed) Labs Reviewed  COMPREHENSIVE METABOLIC PANEL - Abnormal; Notable for the following components:      Result Value   Sodium 133 (*)    Potassium 3.0 (*)    CO2 21 (*)    Glucose, Bld 119 (*)    BUN 26 (*)    Creatinine, Ser 1.59 (*)  GFR, Estimated 47 (*)    All other components within normal limits  LIPASE, BLOOD  CBC  URINALYSIS, ROUTINE W REFLEX MICROSCOPIC    EKG None  Radiology No results found.  Procedures Procedures  {Document cardiac monitor, telemetry assessment procedure when appropriate:1}  Medications Ordered in ED Medications  fentaNYL (SUBLIMAZE) injection 50 mcg (has no administration in time range)    ED Course/ Medical Decision Making/ A&P   {   Click here for ABCD2, HEART and other calculatorsREFRESH Note before signing :1}                              Medical Decision Making Amount and/or Complexity of Data Reviewed Labs: ordered. Radiology: ordered.  Risk Prescription drug management.   ***  12:00 AM Spoke with on call GI, Dr. Meridee Score-- does not feel patient can go under anesthesia with his potassium of 3.0.  Recommends replacing K+ and repeat, repeat CBC, check lactate.  Will put on list for AM eval. Final Clinical Impression(s) / ED Diagnoses Final diagnoses:  None    Rx / DC Orders ED Discharge Orders     None

## 2022-11-19 ENCOUNTER — Encounter (HOSPITAL_COMMUNITY): Payer: Self-pay

## 2022-11-19 ENCOUNTER — Encounter (HOSPITAL_COMMUNITY)
Admission: EM | Disposition: A | Discharge status: Home / Self Care | Payer: Self-pay | Source: Home / Self Care | Attending: Internal Medicine

## 2022-11-19 ENCOUNTER — Emergency Department (HOSPITAL_COMMUNITY): Payer: Medicare Other | Admitting: Anesthesiology

## 2022-11-19 ENCOUNTER — Observation Stay (HOSPITAL_COMMUNITY): Payer: Medicare Other

## 2022-11-19 DIAGNOSIS — M1990 Unspecified osteoarthritis, unspecified site: Secondary | ICD-10-CM | POA: Diagnosis present

## 2022-11-19 DIAGNOSIS — I130 Hypertensive heart and chronic kidney disease with heart failure and stage 1 through stage 4 chronic kidney disease, or unspecified chronic kidney disease: Secondary | ICD-10-CM | POA: Diagnosis present

## 2022-11-19 DIAGNOSIS — K6389 Other specified diseases of intestine: Secondary | ICD-10-CM | POA: Diagnosis not present

## 2022-11-19 DIAGNOSIS — I2510 Atherosclerotic heart disease of native coronary artery without angina pectoris: Secondary | ICD-10-CM | POA: Diagnosis present

## 2022-11-19 DIAGNOSIS — K562 Volvulus: Secondary | ICD-10-CM

## 2022-11-19 DIAGNOSIS — I69354 Hemiplegia and hemiparesis following cerebral infarction affecting left non-dominant side: Secondary | ICD-10-CM | POA: Diagnosis not present

## 2022-11-19 DIAGNOSIS — K219 Gastro-esophageal reflux disease without esophagitis: Secondary | ICD-10-CM | POA: Diagnosis present

## 2022-11-19 DIAGNOSIS — Z7901 Long term (current) use of anticoagulants: Secondary | ICD-10-CM | POA: Diagnosis not present

## 2022-11-19 DIAGNOSIS — I4821 Permanent atrial fibrillation: Secondary | ICD-10-CM | POA: Diagnosis present

## 2022-11-19 DIAGNOSIS — N179 Acute kidney failure, unspecified: Secondary | ICD-10-CM | POA: Diagnosis present

## 2022-11-19 DIAGNOSIS — R1084 Generalized abdominal pain: Secondary | ICD-10-CM

## 2022-11-19 DIAGNOSIS — E871 Hypo-osmolality and hyponatremia: Secondary | ICD-10-CM | POA: Diagnosis present

## 2022-11-19 DIAGNOSIS — I5032 Chronic diastolic (congestive) heart failure: Secondary | ICD-10-CM | POA: Diagnosis present

## 2022-11-19 DIAGNOSIS — N400 Enlarged prostate without lower urinary tract symptoms: Secondary | ICD-10-CM | POA: Diagnosis present

## 2022-11-19 DIAGNOSIS — K5939 Other megacolon: Secondary | ICD-10-CM | POA: Diagnosis present

## 2022-11-19 DIAGNOSIS — K644 Residual hemorrhoidal skin tags: Secondary | ICD-10-CM | POA: Diagnosis present

## 2022-11-19 DIAGNOSIS — K641 Second degree hemorrhoids: Secondary | ICD-10-CM | POA: Diagnosis present

## 2022-11-19 DIAGNOSIS — Q438 Other specified congenital malformations of intestine: Secondary | ICD-10-CM | POA: Diagnosis not present

## 2022-11-19 DIAGNOSIS — N1832 Chronic kidney disease, stage 3b: Secondary | ICD-10-CM | POA: Diagnosis present

## 2022-11-19 DIAGNOSIS — M109 Gout, unspecified: Secondary | ICD-10-CM | POA: Diagnosis present

## 2022-11-19 DIAGNOSIS — E785 Hyperlipidemia, unspecified: Secondary | ICD-10-CM | POA: Diagnosis present

## 2022-11-19 DIAGNOSIS — Z7984 Long term (current) use of oral hypoglycemic drugs: Secondary | ICD-10-CM | POA: Diagnosis not present

## 2022-11-19 DIAGNOSIS — E876 Hypokalemia: Secondary | ICD-10-CM | POA: Diagnosis present

## 2022-11-19 DIAGNOSIS — E1122 Type 2 diabetes mellitus with diabetic chronic kidney disease: Secondary | ICD-10-CM | POA: Diagnosis present

## 2022-11-19 HISTORY — PX: BOWEL DECOMPRESSION: SHX5532

## 2022-11-19 HISTORY — PX: FLEXIBLE SIGMOIDOSCOPY: SHX5431

## 2022-11-19 LAB — CBC WITH DIFFERENTIAL/PLATELET
Abs Immature Granulocytes: 0.02 10*3/uL (ref 0.00–0.07)
Basophils Absolute: 0 10*3/uL (ref 0.0–0.1)
Basophils Relative: 0 %
Eosinophils Absolute: 0 10*3/uL (ref 0.0–0.5)
Eosinophils Relative: 0 %
HCT: 46.5 % (ref 39.0–52.0)
Hemoglobin: 15.3 g/dL (ref 13.0–17.0)
Immature Granulocytes: 0 %
Lymphocytes Relative: 32 %
Lymphs Abs: 2.3 10*3/uL (ref 0.7–4.0)
MCH: 29.7 pg (ref 26.0–34.0)
MCHC: 32.9 g/dL (ref 30.0–36.0)
MCV: 90.3 fL (ref 80.0–100.0)
Monocytes Absolute: 0.7 10*3/uL (ref 0.1–1.0)
Monocytes Relative: 9 %
Neutro Abs: 4.3 10*3/uL (ref 1.7–7.7)
Neutrophils Relative %: 59 %
Platelets: 239 10*3/uL (ref 150–400)
RBC: 5.15 MIL/uL (ref 4.22–5.81)
RDW: 13.8 % (ref 11.5–15.5)
WBC: 7.3 10*3/uL (ref 4.0–10.5)
nRBC: 0 % (ref 0.0–0.2)

## 2022-11-19 LAB — CBG MONITORING, ED: Glucose-Capillary: 95 mg/dL (ref 70–99)

## 2022-11-19 LAB — GLUCOSE, CAPILLARY
Glucose-Capillary: 111 mg/dL — ABNORMAL HIGH (ref 70–99)
Glucose-Capillary: 116 mg/dL — ABNORMAL HIGH (ref 70–99)
Glucose-Capillary: 103 mg/dL — ABNORMAL HIGH (ref 70–99)
Glucose-Capillary: 144 mg/dL — ABNORMAL HIGH (ref 70–99)
Glucose-Capillary: 103 mg/dL — ABNORMAL HIGH (ref 70–99)

## 2022-11-19 LAB — LACTIC ACID, PLASMA
Lactic Acid, Venous: 1 mmol/L (ref 0.5–1.9)
Lactic Acid, Venous: 1.2 mmol/L (ref 0.5–1.9)

## 2022-11-19 LAB — MAGNESIUM: Magnesium: 2.1 mg/dL (ref 1.7–2.4)

## 2022-11-19 LAB — POTASSIUM
Potassium: 3.1 mmol/L — ABNORMAL LOW (ref 3.5–5.1)
Potassium: 3.3 mmol/L — ABNORMAL LOW (ref 3.5–5.1)

## 2022-11-19 SURGERY — SIGMOIDOSCOPY, FLEXIBLE
Anesthesia: Monitor Anesthesia Care

## 2022-11-19 MED ORDER — INSULIN ASPART 100 UNIT/ML IJ SOLN
0.0000 [IU] | Freq: Three times a day (TID) | INTRAMUSCULAR | Status: DC
Start: 2022-11-19 — End: 2022-11-22
  Administered 2022-11-20 – 2022-11-21 (×3): 2 [IU] via SUBCUTANEOUS
  Administered 2022-11-21: 1 [IU] via SUBCUTANEOUS
  Administered 2022-11-21: 2 [IU] via SUBCUTANEOUS
  Administered 2022-11-22: 1 [IU] via SUBCUTANEOUS
  Administered 2022-11-22: 2 [IU] via SUBCUTANEOUS

## 2022-11-19 MED ORDER — INSULIN ASPART 100 UNIT/ML IJ SOLN
0.0000 [IU] | Freq: Every day | INTRAMUSCULAR | Status: DC
Start: 2022-11-19 — End: 2022-11-22

## 2022-11-19 MED ORDER — HYDROMORPHONE HCL 1 MG/ML IJ SOLN
0.5000 mg | INTRAMUSCULAR | Status: DC | PRN
Start: 2022-11-19 — End: 2022-11-22

## 2022-11-19 MED ORDER — FENTANYL CITRATE PF 50 MCG/ML IJ SOSY
50.0000 ug | PREFILLED_SYRINGE | Freq: Once | INTRAMUSCULAR | Status: AC
  Administered 2022-11-19: 50 ug via INTRAVENOUS
  Filled 2022-11-19: qty 1

## 2022-11-19 MED ORDER — LIDOCAINE 2% (20 MG/ML) 5 ML SYRINGE
INTRAMUSCULAR | Status: DC | PRN
  Administered 2022-11-19: 40 mg via INTRAVENOUS

## 2022-11-19 MED ORDER — OXYCODONE HCL 5 MG PO TABS: 5.0000 mg | ORAL_TABLET | ORAL | Status: DC | PRN

## 2022-11-19 MED ORDER — ACETAMINOPHEN 325 MG PO TABS: 650.0000 mg | ORAL_TABLET | Freq: Four times a day (QID) | ORAL | Status: DC | PRN

## 2022-11-19 MED ORDER — PHENYLEPHRINE HCL-NACL 20-0.9 MG/250ML-% IV SOLN
INTRAVENOUS | Status: DC | PRN
  Administered 2022-11-19 (×2): 80 ug via INTRAVENOUS
  Administered 2022-11-19: 160 ug via INTRAVENOUS
  Administered 2022-11-19: 80 ug via INTRAVENOUS

## 2022-11-19 MED ORDER — PANTOPRAZOLE SODIUM 40 MG PO TBEC
40.0000 mg | DELAYED_RELEASE_TABLET | Freq: Every day | ORAL | Status: DC
  Administered 2022-11-19 – 2022-11-22 (×4): 40 mg via ORAL
  Filled 2022-11-19 (×4): qty 1

## 2022-11-19 MED ORDER — SODIUM CHLORIDE 0.9 % IV SOLN: INTRAVENOUS | Status: DC

## 2022-11-19 MED ORDER — ENOXAPARIN SODIUM 100 MG/ML IJ SOSY
90.0000 mg | PREFILLED_SYRINGE | Freq: Two times a day (BID) | INTRAMUSCULAR | Status: DC
  Administered 2022-11-19 – 2022-11-21 (×4): 90 mg via SUBCUTANEOUS
  Filled 2022-11-19 (×5): qty 0.9

## 2022-11-19 MED ORDER — PROPOFOL 10 MG/ML IV BOLUS
INTRAVENOUS | Status: DC | PRN
  Administered 2022-11-19: 125 ug/kg/min via INTRAVENOUS
  Administered 2022-11-19: 30 mg via INTRAVENOUS

## 2022-11-19 MED ORDER — HYDRALAZINE HCL 20 MG/ML IJ SOLN: 10.0000 mg | Freq: Four times a day (QID) | INTRAMUSCULAR | Status: DC | PRN

## 2022-11-19 MED ORDER — POTASSIUM CHLORIDE 10 MEQ/100ML IV SOLN
10.0000 meq | INTRAVENOUS | Status: AC
  Administered 2022-11-19 (×2): 10 meq via INTRAVENOUS
  Filled 2022-11-19 (×2): qty 100

## 2022-11-19 MED ORDER — ACETAMINOPHEN 650 MG RE SUPP: 650.0000 mg | Freq: Four times a day (QID) | RECTAL | Status: DC | PRN

## 2022-11-19 MED ORDER — ALBUTEROL SULFATE (2.5 MG/3ML) 0.083% IN NEBU: 2.5000 mg | INHALATION_SOLUTION | Freq: Four times a day (QID) | RESPIRATORY_TRACT | Status: DC | PRN

## 2022-11-19 MED ORDER — LACTATED RINGERS IV SOLN
INTRAVENOUS | Status: DC | PRN
Start: 2022-11-19 — End: 2022-11-19

## 2022-11-19 MED ORDER — EPHEDRINE SULFATE-NACL 50-0.9 MG/10ML-% IV SOSY
PREFILLED_SYRINGE | INTRAVENOUS | Status: DC | PRN
  Administered 2022-11-19: 5 mg via INTRAVENOUS
  Administered 2022-11-19: 10 mg via INTRAVENOUS

## 2022-11-19 NOTE — Progress Notes (Signed)
Received stat page for urgent colonoscopy/sigmoidoscopy. Patient's chart has been reviewed. For some reason I cannot actually review the images of the CT scan that report a mesenteric swirling and colonic distention concerning for sigmoid volvulus.  Last colonoscopy 2015 which was significantly dirty took 15 minutes to get to the cecum and a preparation artifact required consideration of repeat colonoscopy in 5 years which would have been 2021 but patient did not come for that procedure. His initial laboratories show normal white count without elevation but he has a potassium of 3.0. There is been no potassium supplementation or recheck. Before any type of anesthesia is given to the patient especially since he has a bowel obstruction currently and would likely need anesthesia services, he will need an improved potassium level (usually above 3.5). I have recommended to the emergency department team to repeat a CBC since it has been many hours, repeat his electrolytes, and obtain a lactic acid. If potassium is significantly improved and patient has progressive lactic acid development, then we will consider the role of an emergent/urgent sigmoidoscopy. If potassium is not significantly improved then that needs to be optimized before any anesthesia is administered. If patient's lactic acid is normal, then likely this patient will be evaluated by our day team for further evaluation. I told the ED reach out to me after he has received supplementation and with repeat potassium being seen. Agree with surgical consultation. Also need to have full ID of last dose of Eliquis and if patient is amenable to endoscopic evaluation which is not clear of as of yet. Placed on the [facility] list to be evaluated tomorrow morning (11/7 AM) unless I hear back about something or other clinical changes and potassium level, for safe endoscopic consideration of procedure.   Corliss Parish, MD [place]  [facility] Office # [phone]

## 2022-11-19 NOTE — ED Notes (Signed)
Pt returned from Endo at this time.  NAD noted, pt voices no c/o or needs.  Will monitor.

## 2022-11-19 NOTE — ED Notes (Signed)
Per PA Misty Stanley, RN able to result pending K level prior to completing IV K d/t pending OR. Confirmed with PA, RN to result K level from labs sent at 0007 - prior to initiating IV K x2. Lab called and pending result.

## 2022-11-19 NOTE — H&P (Signed)
[facility] History and Physical  [name]:[id number] DOB: 09-17-1955 DOA: 11/18/2022 PCP: Venita Sheffield, MD  Presented from: Home Chief Complaint: Abdominal pain and distention  History of Present Illness: [name] is a 67 y.o. male with PMH significant for DM2, HTN, HLD, CAD, CHF, A-fib, CVA, ICH with residual deficit, CKD, GERD, arthritis, gout, prior history of appendicectomy and PEG placement with takedown.  11/6, patient was seen at [facility] for evaluation of acute abdominal pain, distention following constipation for 6 days.  Suspected bowel obstruction and sent to ED for further evaluation.  In the ED, patient was afebrile, hemodynamically stable, breathing on room air Labs with CBC unremarkable, lactic acid normal, CMP with sodium 133, potassium 3, BUN/creatinine 26/1.59 CT abdomen pelvis showed sigmoid volvulus with markedly distended colon. EDP consulted GI and general surgery. Potassium was replaced. Earlier this morning, patient underwent flexible sigmoidoscopy by GI Dr. Meridee Score with successful suction decompression of volvulus and with tube placement.  Given the redundancy of the sigmoid colon, patient was deemed to have high risk of recurrence of volvulus and recommended general surgery evaluation for surgical removal.  I received this patient as a carryover admission from last night At the time of my evaluation, patient was lying on bed.  Not in distress.  Abdominal distention present but soft and nontender.  States he had 2 bowel movements for the procedure this morning.  Denies chronic constipation. Patient lives at home alone and ambulates with a walker.  Review of Systems:  All systems were reviewed and were negative unless otherwise mentioned in the HPI   Past medical history: Past Medical History:  Diagnosis Date   A-fib (HCC) 11/19/2010   Acute exacerbation of congestive heart failure (HCC) 11/19/2010   Arthritis     CAD (coronary artery disease) 11/20/2010   CHF (congestive heart failure) (HCC)    CKD (chronic kidney disease) stage 3, GFR 30-59 ml/min (HCC) 05/04/2017   Coronary artery disease    Diabetes type 2, controlled (HCC) 11/19/2010   GERD (gastroesophageal reflux disease) 02/08/2015   Gout    Gout 11/20/2010   History of cerebrovascular accident (CVA) with residual deficit 05/04/2017   History of CVA (cerebrovascular accident) 11/19/2010   HTN (hypertension) 07/01/2012   Hyperlipemia 11/21/2010   Hypertension    Hypertensive emergency 11/19/2010   ICH (intracerebral hemorrhage) (HCC) 12/22/2010   Physical deconditioning 12/22/2010   Pulmonary edema 11/19/2010   Respiratory failure (HCC) 11/19/2010   Shortness of breath    Stroke (HCC) 11/19/2010   Thyroiditis 11/20/2010    Past surgical history: Past Surgical History:  Procedure Laterality Date   IR ANGIOGRAM PELVIS SELECTIVE OR SUPRASELECTIVE  08/25/2022   IR ANGIOGRAM SELECTIVE EACH ADDITIONAL VESSEL  08/25/2022   IR ANGIOGRAM SELECTIVE EACH ADDITIONAL VESSEL  08/25/2022   IR EMBO TUMOR ORGAN ISCHEMIA INFARCT INC GUIDE ROADMAPPING  08/25/2022   IR RADIOLOGIST EVAL & MGMT  07/13/2022   IR RADIOLOGIST EVAL & MGMT  09/25/2022   IR US GUIDE VASC ACCESS LEFT  08/25/2022   IR US GUIDE VASC ACCESS LEFT  08/25/2022   IR US GUIDE VASC ACCESS RIGHT  08/25/2022   PEG PLACEMENT  12/03/2010   Procedure: PERCUTANEOUS ENDOSCOPIC GASTROSTOMY (PEG) PLACEMENT;  Surgeon: Hart Carwin, MD;  Location: [facility]COPY;  Service: Endoscopy;  Laterality: N/A;   TRACHEOSTOMY TUBE PLACEMENT  11/28/2010   Procedure: TRACHEOSTOMY;  Surgeon: Susy Frizzle, MD;  Location: [facility] OR;  Service: ENT;  Laterality: N/A;  Social History:  reports that he quit smoking about 16 years ago. His smoking use included cigarettes. He has never used smokeless tobacco. He reports that he does not drink alcohol and does not use drugs.  Allergies:  No Known Allergies Patient has  no known allergies.   Family history:  Family History  Problem Relation Age of Onset   Diabetes Mother    Diabetes Brother    Colon cancer Neg Hx    Colon polyps Neg Hx    Kidney disease Neg Hx    Esophageal cancer Neg Hx    Heart disease Neg Hx    Gallbladder disease Neg Hx      Physical Exam: Vitals:   11/19/22 0730 11/19/22 0735 11/19/22 0803 11/19/22 1129  BP: 100/78 103/78 95/72 105/83  Pulse: 86 88 85 82  Resp: 12 14 15 18   Temp:  97.8 F (36.6 C) (!) 97.5 F (36.4 C) 98.5 F (36.9 C)  TempSrc:      SpO2: 96% 99% 100% 98%  Weight:      Height:       Wt Readings from Last 3 Encounters:  11/18/22 87.1 kg  11/18/22 87.1 kg  08/25/22 84.9 kg   Body mass index is 26.78 kg/m.  General exam: Pleasant, elderly African-American male.  Not in distress Skin: No rashes, lesions or ulcers. HEENT: Atraumatic, normocephalic, no obvious bleeding Lungs: Clear to auscultation bilaterally CVS: Chronic rate controlled A-fib, no murmur GI/Abd soft, distended, tympanitic but nontender, bowel sound present CNS: Alert, awake, oriented x 3.  Mild garbled speech at baseline Psychiatry: Mood appropriate Extremities: No pedal edema, no calf tenderness   ------------------------------------------------------------------------------------------------------ Assessment/Plan: Principal Problem:   Volvulus of colon (HCC)  Sigmoid volvulus Status post successful decompression with flex sig by Dr. Meridee Score.  Also has a tube in place. Given the redundancy of the sigmoid colon, patient was deemed to have high risk of recurrence of volvulus and recommended general surgery evaluation for surgical removal. General Surgery following Patient denies chronic constipation.  Hypokalemia Potassium level was low and replaced.  Continue to monitor Recent Labs  Lab 11/18/22 1718 11/19/22 0007 11/19/22 0223  K 3.0* 3.1* 3.3*  MG  --  2.1  --    Type 2 diabetes mellitus A1c 6.9 in July  2024 PTA meds-Jardiance, metformin.  Keep oral meds on hold. start SSI/Accu-Cheks Recent Labs  Lab 11/19/22 0538 11/19/22 0723 11/19/22 0801 11/19/22 1130  GLUCAP 111* 116* 95 103*   CHF Hypertension PTA meds- Coreg 25 mg twice daily, Cardizem 120 mg twice daily, Jardiance 10 mg daily, Imdur 30 mg daily, Aldactone 25 mg daily Blood pressures currently running low in 90s..  Continue to monitor blood pressure.  Once it improves, need to resume at least Coreg and Cardizem to minimize risk of A-fib with RVR.   Chronic A-fib PTA meds-Coreg and Cardizem plan as above. Chronically anticoagulated with Eliquis.  Eliquis on hold for now given the chance of surgical intervention.  CAD/HLD H/o CVA, ICH with residual deficit Currently no anginal symptoms Eliquis plan as above.  continue statin  CKD 3a Creatinine at baseline. Recent Labs    04/13/22 0756 04/13/22 1522 04/13/22 1907 04/14/22 0339 04/15/22 0923 04/16/22 0802 04/23/22 1458 08/05/22 1203 08/25/22 0800 11/18/22 1718  BUN 111* 98* 87* 78* 45* 33* 28* 25 23 26*  CREATININE 9.81* 7.69* 6.47* 5.20* 2.37* 1.62* 1.33 1.53* 1.35* 1.59*   GERD Continue PPI  arthritis, gout PRN Tylenol  Mobility: At baseline, ambulates  with a walker  Goals of care   Code Status: Full Code    DVT prophylaxis: start full dose Lovenox for chronic A-fib while Eliquis is on hold for possible surgical intervention.    Antimicrobials: None Fluid: None currently Consultants: GI, General Surgery Family Communication: None at bedside  Dispo: The patient is from: Home              Anticipated d/c is to: Home  Diet: Diet Order             Diet NPO time specified  Diet effective now                    ------------------------------------------------------------------------------------- Severity of Illness: The appropriate patient status for this patient is OBSERVATION. Observation status is judged to be reasonable and  necessary in order to provide the required intensity of service to ensure the patient's safety. The patient's presenting symptoms, physical exam findings, and initial radiographic and laboratory data in the context of their medical condition is felt to place them at decreased risk for further clinical deterioration. Furthermore, it is anticipated that the patient will be medically stable for discharge from the hospital within 2 midnights of admission.  -------------------------------------------------------------------------------------  Home Meds: Prior to Admission medications   Medication Sig Start Date End Date Taking? Authorizing Provider  acetaminophen (TYLENOL) 500 MG tablet Take 1,000 mg by mouth daily as needed for moderate pain, fever or headache.   Yes [provider]  apixaban (ELIQUIS) 5 MG TABS tablet Take 1 tablet (5 mg total) by mouth 2 (two) times daily. 10/06/22  Yes Nahser, Deloris Ping, MD  carvedilol (COREG) 25 MG tablet Take 1 tablet (25 mg total) by mouth 2 (two) times daily with a meal. Needs an appointment before anymore future refills. 08/18/22  Yes Frederica Kuster, MD  diltiazem (CARDIZEM CD) 180 MG 24 hr capsule TAKE 2 CAPSULES BY MOUTH  DAILY AT NOON Patient taking differently: Take 180 mg by mouth See admin instructions. Take 1 capsule by mouth at noon, then 1 capsule at 1600/1700 05/12/22  Yes Swinyer, Zachary George, NP  empagliflozin (JARDIANCE) 10 MG TABS tablet Take 1 tablet (10 mg total) by mouth daily. 02/02/22  Yes Frederica Kuster, MD  isosorbide mononitrate (IMDUR) 30 MG 24 hr tablet Take 1 tablet (30 mg total) by mouth daily. Patient taking differently: Take 30 mg by mouth at bedtime. 04/15/22  Yes Frederica Kuster, MD  metFORMIN (GLUCOPHAGE) 850 MG tablet Take 1 tablet (850 mg total) by mouth daily with breakfast. 11/16/22  Yes Venita Sheffield, MD  Multiple Vitamins-Minerals (MENS ONE DAILY PO) Take 1 tablet by mouth daily.   Yes [provider]   pantoprazole (PROTONIX) 40 MG tablet Take 1 tablet (40 mg total) by mouth daily. Patient taking differently: Take 40 mg by mouth daily in the afternoon. 12/08/21  Yes Frederica Kuster, MD  pravastatin (PRAVACHOL) 40 MG tablet Take 1 tablet (40 mg total) by mouth at bedtime. 11/10/22  Yes Venita Sheffield, MD  spironolactone (ALDACTONE) 25 MG tablet Take 1 tablet (25 mg total) by mouth daily. 09/01/22  Yes Ngetich, Dinah C, NP  Alcohol Swabs (ALCOHOL PREP) 70 % PADS Use to test blood sugar daily. Dx: E11.21 11/16/22   Venita Sheffield, MD  blood glucose meter kit and supplies Dispense based on patient and insurance preference. Use up to four times daily as directed. (FOR ICD-10 E10.9, E11.9). 07/14/21   Frederica Kuster, MD  ciprofloxacin (  CIPRO) 500 MG tablet Take 1 tablet (500 mg total) by mouth 2 (two) times daily. Patient not taking: Reported on 11/19/2022 08/25/22   Allred, Darrell K, PA-C  finasteride (PROSCAR) 5 MG tablet Take 1 tablet (5 mg total) by mouth daily. Patient not taking: Reported on 11/19/2022 04/17/22   Maretta Bees, MD  glucose blood (ONETOUCH ULTRA) test strip 1 each by Other route daily. Dx: E11.21 04/15/22   Frederica Kuster, MD  [facility] DELICA LANCETS 33G MISC Use as directed to test blood sugar once daily 12/06/17   Marcine Matar, MD  phenazopyridine (PYRIDIUM) 100 MG tablet Take 1 tablet (100 mg total) by mouth 3 (three) times daily as needed for pain. Patient not taking: Reported on 11/18/2022 08/25/22   Allred, Darrell K, PA-C  sitaGLIPtin (JANUVIA) 50 MG tablet Take 1 tablet (50 mg total) by mouth daily. Patient not taking: Reported on 11/18/2022 05/13/22   Frederica Kuster, MD  solifenacin (VESICARE) 5 MG tablet Take 1 tablet (5 mg total) by mouth daily. Patient not taking: Reported on 11/19/2022 08/25/22   Allred, Darrell K, PA-C  tamsulosin (FLOMAX) 0.4 MG CAPS capsule Take 0.4 mg by mouth at bedtime. Patient not taking: Reported on 11/19/2022    [provider]    Labs on Admission:   CBC: Recent Labs  Lab 11/18/22 1718 11/19/22 0007  WBC 6.8 7.3  NEUTROABS  --  4.3  HGB 15.4 15.3  HCT 47.0 46.5  MCV 90.2 90.3  PLT 263 239    Basic Metabolic Panel: Recent Labs  Lab 11/18/22 1718 11/19/22 0007 11/19/22 0223  NA 133*  --   --   K 3.0* 3.1* 3.3*  CL 101  --   --   CO2 21*  --   --   GLUCOSE 119*  --   --   BUN 26*  --   --   CREATININE 1.59*  --   --   CALCIUM 9.8  --   --   MG  --  2.1  --     Liver Function Tests: Recent Labs  Lab 11/18/22 1718  AST 24  ALT 31  ALKPHOS 55  BILITOT 0.9  PROT 7.3  ALBUMIN 3.9   Recent Labs  Lab 11/18/22 1718  LIPASE 26   No results for input(s): "AMMONIA" in the last 168 hours.  Cardiac Enzymes: No results for input(s): "CKTOTAL", "CKMB", "CKMBINDEX", "TROPONINI" in the last 168 hours.  BNP (last 3 results) No results for input(s): "BNP" in the last 8760 hours.  ProBNP (last 3 results) No results for input(s): "PROBNP" in the last 8760 hours.  CBG: Recent Labs  Lab 11/19/22 0538 11/19/22 0723 11/19/22 0801 11/19/22 1130  GLUCAP 111* 116* 95 103*    Lipase     Component Value Date/Time   LIPASE 26 11/18/2022 1718     Urinalysis    Component Value Date/Time   COLORURINE YELLOW 04/13/2022 1009   APPEARANCEUR CLOUDY (A) 04/13/2022 1009   LABSPEC 1.012 04/13/2022 1009   PHURINE 5.0 04/13/2022 1009   GLUCOSEU >=500 (A) 04/13/2022 1009   HGBUR LARGE (A) 04/13/2022 1009   BILIRUBINUR NEGATIVE 04/13/2022 1009   BILIRUBINUR negative 04/09/2022 1149   KETONESUR NEGATIVE 04/13/2022 1009   PROTEINUR 30 (A) 04/13/2022 1009   UROBILINOGEN 0.2 04/09/2022 1149   UROBILINOGEN 0.2 09/02/2012 1251   NITRITE NEGATIVE 04/13/2022 1009   LEUKOCYTESUR LARGE (A) 04/13/2022 1009     Drugs of Abuse  No  results found for: "LABOPIA", "COCAINSCRNUR", "LABBENZ", "AMPHETMU", "THCU", "LABBARB"    Radiological Exams on Admission: CT ABDOMEN PELVIS W  CONTRAST  Result Date: 11/18/2022 CLINICAL DATA:  Abdominal pain EXAM: CT ABDOMEN AND PELVIS WITH CONTRAST TECHNIQUE: Multidetector CT imaging of the abdomen and pelvis was performed using the standard protocol following bolus administration of intravenous contrast. RADIATION DOSE REDUCTION: This exam was performed according to the departmental dose-optimization program which includes automated exposure control, adjustment of the mA and/or kV according to patient size and/or use of iterative reconstruction technique. CONTRAST:  60mL OMNIPAQUE IOHEXOL 350 MG/ML SOLN COMPARISON:  04/13/2022 FINDINGS: Lower chest: Coronary artery and aortic calcifications. No acute abnormality. Hepatobiliary: No focal hepatic abnormality. Gallbladder unremarkable. Pancreas: No focal abnormality or ductal dilatation. Spleen: No focal abnormality.  Normal size. Adrenals/Urinary Tract: Adrenal glands normal. Benign-appearing cyst in the upper pole left kidney. No follow-up imaging recommended. No stones or hydronephrosis. Urinary bladder unremarkable. Stomach/Bowel: Markedly distended colon measuring up to 10 cm in the cecum and 9 cm in the transverse colon. Stomach and small bowel are decompressed. Swirled appearance of the mesentery leading to the sigmoid colon compatible with sigmoid volvulus. Vascular/Lymphatic: No evidence of aneurysm or adenopathy. Aortic atherosclerosis. Reproductive: No visible focal abnormality. Other: No free fluid or free air. Musculoskeletal: No acute bony abnormality. IMPRESSION: Sigmoid volvulus with markedly distended colon. Aortic atherosclerosis. These results were called by telephone at the time of interpretation on 11/18/2022 at 11:34 pm to provider Dr. Delena Serve, Who verbally acknowledged these results. Electronically Signed   By: Charlett Nose M.D.   On: 11/18/2022 23:38     Signed, Lorin Glass, MD [facility] 11/19/2022

## 2022-11-19 NOTE — ED Notes (Signed)
ED TO INPATIENT HANDOFF REPORT  ED Nurse Name and Phone #: Victorino Dike [phone]  S Name/Age/Gender [name] 67 y.o. male Room/Bed: 033C/033C  Code Status   Code Status: Full Code  Home/SNF/Other Home Patient oriented to: self, place, time, and situation Is this baseline? Yes   Triage Complete: Triage complete  Chief Complaint Sigmoid volvulus (HCC) [K56.2] Volvulus of colon (HCC) [K56.2]  Triage Note The pt has had abd pain since Friday no n v or diarrhea  his last bm was last Friday also  He pt was seen at his doctors office earlier today and they sent him here for treatment   Allergies No Known Allergies  Level of Care/Admitting Diagnosis ED Disposition     ED Disposition  Admit   Condition  --   Comment  The patient appears reasonably stabilized for admission considering the current resources, flow, and capabilities available in the ED at this time, and I doubt any other [facility] requiring further screening and/or treatment in the ED prior to admission is  present.          B Medical/Surgery History Past Medical History:  Diagnosis Date   A-fib (HCC) 11/19/2010   Acute exacerbation of congestive heart failure (HCC) 11/19/2010   Arthritis    CAD (coronary artery disease) 11/20/2010   CHF (congestive heart failure) (HCC)    CKD (chronic kidney disease) stage 3, GFR 30-59 ml/min (HCC) 05/04/2017   Coronary artery disease    Diabetes type 2, controlled (HCC) 11/19/2010   GERD (gastroesophageal reflux disease) 02/08/2015   Gout    Gout 11/20/2010   History of cerebrovascular accident (CVA) with residual deficit 05/04/2017   History of CVA (cerebrovascular accident) 11/19/2010   HTN (hypertension) 07/01/2012   Hyperlipemia 11/21/2010   Hypertension    Hypertensive emergency 11/19/2010   ICH (intracerebral hemorrhage) (HCC) 12/22/2010   Physical deconditioning 12/22/2010   Pulmonary edema 11/19/2010   Respiratory failure (HCC) 11/19/2010   Shortness  of breath    Stroke (HCC) 11/19/2010   Thyroiditis 11/20/2010   Past Surgical History:  Procedure Laterality Date   IR ANGIOGRAM PELVIS SELECTIVE OR SUPRASELECTIVE  08/25/2022   IR ANGIOGRAM SELECTIVE EACH ADDITIONAL VESSEL  08/25/2022   IR ANGIOGRAM SELECTIVE EACH ADDITIONAL VESSEL  08/25/2022   IR EMBO TUMOR ORGAN ISCHEMIA INFARCT INC GUIDE ROADMAPPING  08/25/2022   IR RADIOLOGIST EVAL & MGMT  07/13/2022   IR RADIOLOGIST EVAL & MGMT  09/25/2022   IR US GUIDE VASC ACCESS LEFT  08/25/2022   IR US GUIDE VASC ACCESS LEFT  08/25/2022   IR US GUIDE VASC ACCESS RIGHT  08/25/2022   PEG PLACEMENT  12/03/2010   Procedure: PERCUTANEOUS ENDOSCOPIC GASTROSTOMY (PEG) PLACEMENT;  Surgeon: Hart Carwin, MD;  Location: [facility] ENDOSCOPY;  Service: Endoscopy;  Laterality: N/A;   TRACHEOSTOMY TUBE PLACEMENT  11/28/2010   Procedure: TRACHEOSTOMY;  Surgeon: Susy Frizzle, MD;  Location: [facility] OR;  Service: ENT;  Laterality: N/A;     A IV Location/Drains/Wounds Patient Lines/Drains/Airways Status     Active Line/Drains/Airways     Name Placement date Placement time Site Days   Peripheral IV 11/18/22 20 G Left Antecubital 11/18/22  2229  Antecubital  1            Intake/Output Last 24 hours  Intake/Output Summary (Last 24 hours) at 11/19/2022 0955 Last data filed at 11/19/2022 0655 Gross per 24 hour  Intake 250 ml  Output --  Net 250 ml    Labs/Imaging Results  for orders placed or performed during the hospital encounter of 11/18/22 (from the past 48 hour(s))  Lipase, blood     Status: None   Collection Time: 11/18/22  5:18 PM  Result Value Ref Range   Lipase 26 11 - 51 U/L    Comment: Performed at [facility], [address] [place], [place] [address]  Comprehensive metabolic panel     Status: Abnormal   Collection Time: 11/18/22  5:18 PM  Result Value Ref Range   Sodium 133 (L) 135 - 145 mmol/L   Potassium 3.0 (L) 3.5 - 5.1 mmol/L   Chloride 101 98 - 111 mmol/L   CO2 21 (L) 22 - 32 mmol/L    Glucose, Bld 119 (H) 70 - 99 mg/dL    Comment: Glucose reference range applies only to samples taken after fasting for at least 8 hours.   BUN 26 (H) 8 - 23 mg/dL   Creatinine, Ser 4.78 (H) 0.61 - 1.24 mg/dL   Calcium 9.8 8.9 - 29.5 mg/dL   Total Protein 7.3 6.5 - 8.1 g/dL   Albumin 3.9 3.5 - 5.0 g/dL   AST 24 15 - 41 U/L   ALT 31 0 - 44 U/L   Alkaline Phosphatase 55 38 - 126 U/L   Total Bilirubin 0.9 <1.2 mg/dL   GFR, Estimated 47 (L) >60 mL/min    Comment: (NOTE) Calculated using the CKD-EPI Creatinine Equation (2021)    Anion gap 11 5 - 15    Comment: Performed at [facility], [address], [place], [place] [address]  CBC     Status: None   Collection Time: 11/18/22  5:18 PM  Result Value Ref Range   WBC 6.8 4.0 - 10.5 K/uL   RBC 5.21 4.22 - 5.81 MIL/uL   Hemoglobin 15.4 13.0 - 17.0 g/dL   HCT 86.5 78.4 - 69.6 %   MCV 90.2 80.0 - 100.0 fL   MCH 29.6 26.0 - 34.0 pg   MCHC 32.8 30.0 - 36.0 g/dL   RDW 29.5 28.4 - 13.2 %   Platelets 263 150 - 400 K/uL   nRBC 0.0 0.0 - 0.2 %    Comment: Performed at [facility], [address], [place], [place] [address]  CBC with Differential     Status: None   Collection Time: 11/19/22 12:07 AM  Result Value Ref Range   WBC 7.3 4.0 - 10.5 K/uL   RBC 5.15 4.22 - 5.81 MIL/uL   Hemoglobin 15.3 13.0 - 17.0 g/dL   HCT 27.2 53.6 - 64.4 %   MCV 90.3 80.0 - 100.0 fL   MCH 29.7 26.0 - 34.0 pg   MCHC 32.9 30.0 - 36.0 g/dL   RDW 03.4 74.2 - 59.5 %   Platelets 239 150 - 400 K/uL   nRBC 0.0 0.0 - 0.2 %   Neutrophils Relative % 59 %   Neutro Abs 4.3 1.7 - 7.7 K/uL   Lymphocytes Relative 32 %   Lymphs Abs 2.3 0.7 - 4.0 K/uL   Monocytes Relative 9 %   Monocytes Absolute 0.7 0.1 - 1.0 K/uL   Eosinophils Relative 0 %   Eosinophils Absolute 0.0 0.0 - 0.5 K/uL   Basophils Relative 0 %   Basophils Absolute 0.0 0.0 - 0.1 K/uL   Immature Granulocytes 0 %   Abs Immature Granulocytes 0.02 0.00 - 0.07 K/uL    Comment: Performed at  [facility], [address],  [place], [place] [address]  Lactic acid, plasma     Status: None   Collection Time: 11/19/22 12:07 AM  Result Value Ref Range   Lactic Acid, Venous 1.2 0.5 - 1.9 mmol/L    Comment: Performed at [facility], [address], [place], [place] [address]  Magnesium     Status: None   Collection Time: 11/19/22 12:07 AM  Result Value Ref Range   Magnesium 2.1 1.7 - 2.4 mg/dL    Comment: Performed at [facility], [address] [place], [place] [address]  Potassium     Status: Abnormal   Collection Time: 11/19/22 12:07 AM  Result Value Ref Range   Potassium 3.1 (L) 3.5 - 5.1 mmol/L    Comment: Performed at [facility], [address] [place], [place] [address]  Lactic acid, plasma     Status: None   Collection Time: 11/19/22  2:23 AM  Result Value Ref Range   Lactic Acid, Venous 1.0 0.5 - 1.9 mmol/L    Comment: Performed at [facility], [address] [place], [place] [address]  Potassium     Status: Abnormal   Collection Time: 11/19/22  2:23 AM  Result Value Ref Range   Potassium 3.3 (L) 3.5 - 5.1 mmol/L    Comment: Performed at [facility], [address] [place], [place] [address]  Glucose, capillary     Status: Abnormal   Collection Time: 11/19/22  5:38 AM  Result Value Ref Range   Glucose-Capillary 111 (H) 70 - 99 mg/dL    Comment: Glucose reference range applies only to samples taken after fasting for at least 8 hours.  Glucose, capillary     Status: Abnormal   Collection Time: 11/19/22  7:23 AM  Result Value Ref Range   Glucose-Capillary 116 (H) 70 - 99 mg/dL    Comment: Glucose reference range applies only to samples taken after fasting for at least 8 hours.  CBG monitoring, ED     Status: None   Collection Time: 11/19/22  8:01 AM  Result Value Ref Range   Glucose-Capillary 95 70 - 99 mg/dL    Comment: Glucose reference range applies only to samples taken after fasting for at least 8 hours.   Comment 1  Notify RN    Comment 2 Document in Chart    CT ABDOMEN PELVIS W CONTRAST  Result Date: 11/18/2022 CLINICAL DATA:  Abdominal pain EXAM: CT ABDOMEN AND PELVIS WITH CONTRAST TECHNIQUE: Multidetector CT imaging of the abdomen and pelvis was performed using the standard protocol following bolus administration of intravenous contrast. RADIATION DOSE REDUCTION: This exam was performed according to the departmental dose-optimization program which includes automated exposure control, adjustment of the mA and/or kV according to patient size and/or use of iterative reconstruction technique. CONTRAST:  60mL OMNIPAQUE IOHEXOL 350 MG/ML SOLN COMPARISON:  04/13/2022 FINDINGS: Lower chest: Coronary artery and aortic calcifications. No acute abnormality. Hepatobiliary: No focal hepatic abnormality. Gallbladder unremarkable. Pancreas: No focal abnormality or ductal dilatation. Spleen: No focal abnormality.  Normal size. Adrenals/Urinary Tract: Adrenal glands normal. Benign-appearing cyst in the upper pole left kidney. No follow-up imaging recommended. No stones or hydronephrosis. Urinary bladder unremarkable. Stomach/Bowel: Markedly distended colon measuring up to 10 cm in the cecum and 9 cm in the transverse colon. Stomach and small bowel are decompressed. Swirled appearance of the mesentery leading to the sigmoid colon compatible with sigmoid volvulus. Vascular/Lymphatic: No evidence of aneurysm or adenopathy. Aortic atherosclerosis. Reproductive:  No visible focal abnormality. Other: No free fluid or free air. Musculoskeletal: No acute bony abnormality. IMPRESSION: Sigmoid volvulus with markedly distended colon. Aortic atherosclerosis. These results were called by telephone at the time of interpretation on 11/18/2022 at 11:34 pm to provider Dr. Delena Serve, Who verbally acknowledged these results. Electronically Signed   By: Charlett Nose M.D.   On: 11/18/2022 23:38    Pending Labs Unresulted Labs (From admission, onward)      Start     Ordered   11/20/22 0500  Basic metabolic panel  Tomorrow morning,   R        11/19/22 0753   11/20/22 0500  CBC  Tomorrow morning,   R        11/19/22 0753   11/18/22 1702  Urinalysis, Routine w reflex microscopic -Urine, Clean Catch  Once,   URGENT       Question:  Specimen Source  Answer:  Urine, Clean Catch   11/18/22 1702            Vitals/Pain Today's Vitals   11/19/22 0723 11/19/22 0730 11/19/22 0735 11/19/22 0803  BP: (!) 85/55 100/78 103/78 95/72  Pulse: 89 86 88 85  Resp: 14 12 14 15   Temp:   97.8 F (36.6 C) (!) 97.5 F (36.4 C)  TempSrc:      SpO2: 98% 96% 99% 100%  Weight:      Height:      PainSc:        Isolation Precautions No active isolations  Medications Medications  insulin aspart (novoLOG) injection 0-9 Units ( Subcutaneous Not Given 11/19/22 0803)  insulin aspart (novoLOG) injection 0-5 Units (has no administration in time range)  acetaminophen (TYLENOL) tablet 650 mg (has no administration in time range)    Or  acetaminophen (TYLENOL) suppository 650 mg (has no administration in time range)  albuterol (PROVENTIL) (2.5 MG/3ML) 0.083% nebulizer solution 2.5 mg (has no administration in time range)  hydrALAZINE (APRESOLINE) injection 10 mg (has no administration in time range)  oxyCODONE (Oxy IR/ROXICODONE) immediate release tablet 5 mg (has no administration in time range)  HYDROmorphone (DILAUDID) injection 0.5-1 mg (has no administration in time range)  pantoprazole (PROTONIX) EC tablet 40 mg (has no administration in time range)  fentaNYL (SUBLIMAZE) injection 50 mcg (50 mcg Intravenous Given 11/18/22 2230)  iohexol (OMNIPAQUE) 350 MG/ML injection 60 mL (60 mLs Intravenous Contrast Given 11/18/22 2325)  potassium chloride 10 mEq in 100 mL IVPB (0 mEq Intravenous Stopped 11/19/22 0212)  fentaNYL (SUBLIMAZE) injection 50 mcg (50 mcg Intravenous Given 11/19/22 0150)    Mobility walks     Focused Assessments    R Recommendations:  See Admitting Provider Note  Report given to:   Additional Notes:

## 2022-11-19 NOTE — Anesthesia Preprocedure Evaluation (Addendum)
Anesthesia Evaluation  Patient identified by MRN, date of birth, ID band Patient awake    Reviewed: Allergy & Precautions, NPO status , Patient's Chart, lab work & pertinent test results  Airway Mallampati: III  TM Distance: >3 FB Neck ROM: Full    Dental no notable dental hx.    Pulmonary former smoker   Pulmonary exam normal        Cardiovascular hypertension, Pt. on home beta blockers + CAD and +CHF  Normal cardiovascular exam+ dysrhythmias Atrial Fibrillation      Neuro/Psych CVA, Residual Symptoms  negative psych ROS   GI/Hepatic Neg liver ROS,GERD  Medicated and Controlled,,  Endo/Other  diabetes, Oral Hypoglycemic Agents    Renal/GU Renal InsufficiencyRenal disease     Musculoskeletal  (+) Arthritis ,  Ambulates with a walker PRN wheelchair   Abdominal   Peds  Hematology  (+) Blood dyscrasia (Eliquis)   Anesthesia Other Findings Volvulus  Reproductive/Obstetrics                             Anesthesia Physical Anesthesia Plan  ASA: 4 and emergent  Anesthesia Plan: MAC   Post-op Pain Management:    Induction: Intravenous  PONV Risk Score and Plan: 1 and Propofol infusion  Airway Management Planned: Simple Face Mask  Additional Equipment:   Intra-op Plan:   Post-operative Plan:   Informed Consent: I have reviewed the patients History and Physical, chart, labs and discussed the procedure including the risks, benefits and alternatives for the proposed anesthesia with the patient or authorized representative who has indicated his/her understanding and acceptance.     Dental advisory given  Plan Discussed with: CRNA and Surgeon  Anesthesia Plan Comments:        Anesthesia Quick Evaluation

## 2022-11-19 NOTE — Progress Notes (Signed)
PHARMACY - ANTICOAGULATION CONSULT NOTE  Pharmacy Consult for therapeutic enoxaparin Indication: atrial fibrillation  No Known Allergies  Patient Measurements: Height: 5\' 11"  (180.3 cm) Weight: 87.1 kg (192 lb 0.3 oz) IBW/kg (Calculated) : 75.3  Vital Signs: Temp: 97.5 F (36.4 C) (11/07 0803) Temp Source: Temporal (11/07 0526) BP: 95/72 (11/07 0803) Pulse Rate: 85 (11/07 0803)  Labs: Recent Labs    11/18/22 1718 11/19/22 0007  HGB 15.4 15.3  HCT 47.0 46.5  PLT 263 239  CREATININE 1.59*  --     Estimated Creatinine Clearance: 47.4 mL/min (A) (by C-G formula based on SCr of 1.59 mg/dL (H)).  Medical History: Past Medical History:  Diagnosis Date   A-fib (HCC) 11/19/2010   Acute exacerbation of congestive heart failure (HCC) 11/19/2010   Arthritis    CAD (coronary artery disease) 11/20/2010   CHF (congestive heart failure) (HCC)    CKD (chronic kidney disease) stage 3, GFR 30-59 ml/min (HCC) 05/04/2017   Coronary artery disease    Diabetes type 2, controlled (HCC) 11/19/2010   GERD (gastroesophageal reflux disease) 02/08/2015   Gout    Gout 11/20/2010   History of cerebrovascular accident (CVA) with residual deficit 05/04/2017   History of CVA (cerebrovascular accident) 11/19/2010   HTN (hypertension) 07/01/2012   Hyperlipemia 11/21/2010   Hypertension    Hypertensive emergency 11/19/2010   ICH (intracerebral hemorrhage) (HCC) 12/22/2010   Physical deconditioning 12/22/2010   Pulmonary edema 11/19/2010   Respiratory failure (HCC) 11/19/2010   Shortness of breath    Stroke (HCC) 11/19/2010   Thyroiditis 11/20/2010    Assessment: 67 y.o. male with a pmh significant for PAF on apixaban PTA and hx prior CVA who presents for Sigmoidoscopy for treatment of sigmoid volvulus. Pharmacy consulted to dose therapeutic enoxaparin while apixaban is held for upcoming procedure. LD apixaban 11/06 @10AM  per medication history. CBC stable, plts 239.  Goal of Therapy:   Anti-Xa level 0.6-1 units/ml 4hrs after LMWH dose given Monitor platelets by anticoagulation protocol: Yes   Plan:  Start enoxaparin 90mg  SQ twice daily Check anti-Xa level daily as needed Continue to monitor H&H and platelets   Thank you for allowing pharmacy to be a part of this patient's care.  Thelma Barge, PharmD Clinical Pharmacist

## 2022-11-19 NOTE — Consult Note (Signed)
Gastroenterology Inpatient Consultation   Attending Requesting Consult No att. providers found  Hospital Day Hospital Day: 2  Reason for Consult Sigmoid volvulus/abdominal pain    History of Present Illness  [name] is a 67 y.o. male with a pmh significant for CHF, CAD, PAF (on anticoagulation), prior CVA, hypertension, hyperlipidemia, CRI, arthritis, diabetes, BPH (status post interventions to decrease prostate size).  The GI service is consulted for evaluation and management of progressive abdominal pain with imaging consistent with sigmoid volvulus.  The patient states that he was otherwise in his normal state of health until approximately a week or so ago (Friday).  At that point he began to experience progressive abdominal discomfort and cramping.  This progressed over the course of the next few days where he no longer had any further bowel movements or passage of gas.  His abdomen began to further expand and distended.  With progressive symptoms he came in for further evaluation.  He received pain medications.  He underwent cross-sectional imaging with full results as below that showed evidence of concern for sigmoid volvulus.  His electrolytes were found to be significantly abnormal with a potassium of 3.0 on initial set of labs.  Repeat laboratories show potassium of 3.1 with lactic acid normal.  Surgically evaluated the patient and asked for urgent colonic decompression if possible to try to stave off emergency surgery.  Patient states that he normally has a bowel movement at least once daily if not every other day.  Previous imaging suggest that he has a redundant sigmoid colon.  Last colonoscopy was in 2016 and showed poor preparation patient with plan for 5-year follow-up per the prior GI providers report, the patient did not have that performed.  GI Review of Systems Positive as above Negative for dysphagia, vomiting, nausea pyrosis, melena, hematochezia  Review of Systems   General: Denies fevers/chills/unintentional weight loss Cardiovascular: Denies chest pain Pulmonary: Denies shortness of breath outside of his baseline Gastroenterological: See HPI Genitourinary: Denies darkened urine Hematological: Positive for easy bruising/bleeding due to blood thinner (last dose on Wednesday morning) Dermatological: Denies jaundice Psychological: Mood is concerned but hopeful to improve   Histories  Past Medical History Past Medical History:  Diagnosis Date   A-fib (HCC) 11/19/2010   Acute exacerbation of congestive heart failure (HCC) 11/19/2010   Arthritis    CAD (coronary artery disease) 11/20/2010   CHF (congestive heart failure) (HCC)    CKD (chronic kidney disease) stage 3, GFR 30-59 ml/min (HCC) 05/04/2017   Coronary artery disease    Diabetes type 2, controlled (HCC) 11/19/2010   GERD (gastroesophageal reflux disease) 02/08/2015   Gout    Gout 11/20/2010   History of cerebrovascular accident (CVA) with residual deficit 05/04/2017   History of CVA (cerebrovascular accident) 11/19/2010   HTN (hypertension) 07/01/2012   Hyperlipemia 11/21/2010   Hypertension    Hypertensive emergency 11/19/2010   ICH (intracerebral hemorrhage) (HCC) 12/22/2010   Physical deconditioning 12/22/2010   Pulmonary edema 11/19/2010   Respiratory failure (HCC) 11/19/2010   Shortness of breath    Stroke (HCC) 11/19/2010   Thyroiditis 11/20/2010   Past Surgical History:  Procedure Laterality Date   IR ANGIOGRAM PELVIS SELECTIVE OR SUPRASELECTIVE  08/25/2022   IR ANGIOGRAM SELECTIVE EACH ADDITIONAL VESSEL  08/25/2022   IR ANGIOGRAM SELECTIVE EACH ADDITIONAL VESSEL  08/25/2022   IR EMBO TUMOR ORGAN ISCHEMIA INFARCT INC GUIDE ROADMAPPING  08/25/2022   IR RADIOLOGIST EVAL & MGMT  07/13/2022   IR RADIOLOGIST EVAL &  MGMT  09/25/2022   IR US GUIDE VASC ACCESS LEFT  08/25/2022   IR US GUIDE VASC ACCESS LEFT  08/25/2022   IR US GUIDE VASC ACCESS RIGHT  08/25/2022   PEG PLACEMENT   12/03/2010   Procedure: PERCUTANEOUS ENDOSCOPIC GASTROSTOMY (PEG) PLACEMENT;  Surgeon: Hart Carwin, MD;  Location: [facility] ENDOSCOPY;  Service: Endoscopy;  Laterality: N/A;   TRACHEOSTOMY TUBE PLACEMENT  11/28/2010   Procedure: TRACHEOSTOMY;  Surgeon: Susy Frizzle, MD;  Location: [facility] OR;  Service: ENT;  Laterality: N/A;    Allergies No Known Allergies  Family History Family History  Problem Relation Age of Onset   Diabetes Mother    Diabetes Brother    Colon cancer Neg Hx    Colon polyps Neg Hx    Kidney disease Neg Hx    Esophageal cancer Neg Hx    Heart disease Neg Hx    Gallbladder disease Neg Hx    The patient's FH is negative for IBD/IBS/Liver Disease/GI Malignancies.  Social History Social History   Socioeconomic History   Marital status: Single    Spouse name: Not on file   Number of children: 0   Years of education: Not on file   Highest education level: Not on file  Occupational History   Occupation: Disability  Tobacco Use   Smoking status: Former    Current packs/day: 0.00    Types: Cigarettes    Quit date: 12/19/2005    Years since quitting: 16.9   Smokeless tobacco: Never  Vaping Use   Vaping status: Never Used  Substance and Sexual Activity   Alcohol use: No    Alcohol/week: 11.0 standard drinks of alcohol    Types: 6 Glasses of wine, 5 Shots of liquor per week    Comment: quit in 2012   Drug use: No   Sexual activity: Not on file  Other Topics Concern   Not on file  Social History Narrative   Diet: No      Caffeine: Yes      Married, if yes what year: No      Do you live in a house, apartment, assisted living, condo, trailer, ect: House      Is it one or more stories: No      How many persons live in your home? One      Pets: No      Highest level or education completed: 4 year college      Current/Past profession: Business       Exercise: Yes                 Type and how often: Every other day         Living Will: Yes   DNR: No    POA/HPOA: Yes      Functional Status:   Do you have difficulty bathing or dressing yourself? No   Do you have difficulty preparing food or eating? No   Do you have difficulty managing your medications? No   Do you have difficulty managing your finances? No   Do you have difficulty affording your medications? No   Social Determinants of Health   Financial Resource Strain: Not on file  Food Insecurity: No Food Insecurity (04/20/2022)   Hunger Vital Sign    Worried About Running Out of Food in the Last Year: Never true    Ran Out of Food in the Last Year: Never true  Transportation Needs: Unmet Transportation Needs (04/22/2022)  PRAPARE - Administrator, Civil Service (Medical): Yes    Lack of Transportation (Non-Medical): Yes  Physical Activity: Not on file  Stress: Not on file  Social Connections: Not on file  Intimate Partner Violence: Not on file   The patient denies Drug, Alcohol, or Tobacco use.   Occupation is Travel   Medications  Home Medications No current facility-administered medications on file prior to encounter.   Current Outpatient Medications on File Prior to Encounter  Medication Sig Dispense Refill   acetaminophen (TYLENOL) 500 MG tablet Take 1,000 mg by mouth daily as needed for moderate pain, fever or headache.     apixaban (ELIQUIS) 5 MG TABS tablet Take 1 tablet (5 mg total) by mouth 2 (two) times daily. 180 tablet 1   carvedilol (COREG) 25 MG tablet Take 1 tablet (25 mg total) by mouth 2 (two) times daily with a meal. Needs an appointment before anymore future refills. 180 tablet 1   diltiazem (CARDIZEM CD) 180 MG 24 hr capsule TAKE 2 CAPSULES BY MOUTH  DAILY AT NOON (Patient taking differently: Take 180 mg by mouth See admin instructions. Take 1 capsule by mouth at noon, then 1 capsule at 1600/1700) 180 capsule 2   empagliflozin (JARDIANCE) 10 MG TABS tablet Take 1 tablet (10 mg total) by mouth daily. 90 tablet 2   isosorbide mononitrate (IMDUR)  30 MG 24 hr tablet Take 1 tablet (30 mg total) by mouth daily. (Patient taking differently: Take 30 mg by mouth at bedtime.) 90 tablet 3   metFORMIN (GLUCOPHAGE) 850 MG tablet Take 1 tablet (850 mg total) by mouth daily with breakfast. 90 tablet 1   Multiple Vitamins-Minerals (MENS ONE DAILY PO) Take 1 tablet by mouth daily.     pantoprazole (PROTONIX) 40 MG tablet Take 1 tablet (40 mg total) by mouth daily. (Patient taking differently: Take 40 mg by mouth daily in the afternoon.) 90 tablet 3   pravastatin (PRAVACHOL) 40 MG tablet Take 1 tablet (40 mg total) by mouth at bedtime. 90 tablet 1   spironolactone (ALDACTONE) 25 MG tablet Take 1 tablet (25 mg total) by mouth daily. 90 tablet 1   Alcohol Swabs (ALCOHOL PREP) 70 % PADS Use to test blood sugar daily. Dx: E11.21 100 each 3   blood glucose meter kit and supplies Dispense based on patient and insurance preference. Use up to four times daily as directed. (FOR ICD-10 E10.9, E11.9). 1 each 0   ciprofloxacin (CIPRO) 500 MG tablet Take 1 tablet (500 mg total) by mouth 2 (two) times daily. (Patient not taking: Reported on 11/19/2022) 14 tablet 0   finasteride (PROSCAR) 5 MG tablet Take 1 tablet (5 mg total) by mouth daily. (Patient not taking: Reported on 11/19/2022) 30 tablet 1   glucose blood (ONETOUCH ULTRA) test strip 1 each by Other route daily. Dx: E11.21 100 each 11   ONETOUCH DELICA LANCETS 33G MISC Use as directed to test blood sugar once daily 100 each 12   phenazopyridine (PYRIDIUM) 100 MG tablet Take 1 tablet (100 mg total) by mouth 3 (three) times daily as needed for pain. (Patient not taking: Reported on 11/18/2022) 21 tablet 0   sitaGLIPtin (JANUVIA) 50 MG tablet Take 1 tablet (50 mg total) by mouth daily. (Patient not taking: Reported on 11/18/2022) 90 tablet 1   solifenacin (VESICARE) 5 MG tablet Take 1 tablet (5 mg total) by mouth daily. (Patient not taking: Reported on 11/19/2022) 7 tablet 0   tamsulosin (FLOMAX) 0.4 MG  CAPS capsule Take  0.4 mg by mouth at bedtime. (Patient not taking: Reported on 11/19/2022)     Scheduled Inpatient Medications  Continuous Inpatient Infusions  sodium chloride     PRN Inpatient Medications    Physical Examination  BP (!) 125/93   Pulse 82   Temp (!) 97.3 F (36.3 C) (Temporal)   Resp 17   Ht 5\' 11"  (1.803 m)   Wt 87.1 kg   SpO2 96%   BMI 26.78 kg/m  GEN: NAD, resting in bed, appears chronically ill but is nontoxic at this time PSYCH: Cooperative, without pressured speech EYE: Conjunctivae pink, sclerae anicteric ENT: Dry MM CV: Nontachycardic RESP: No audible wheezing GI: Protuberant abdomen, rounded, distended, tympanic to percussion, tenderness to palpation throughout, volitional guarding present, no rebound  MSK/EXT: Bilateral pedal edema present SKIN: No jaundice NEURO:  Alert & Oriented x 3, no focal deficits   Review of Data  I reviewed the following data at the time of this encounter:  Laboratory Studies   Recent Labs  Lab 11/18/22 1718 11/19/22 0007 11/19/22 0223  NA 133*  --   --   K 3.0* 3.1* 3.3*  CL 101  --   --   CO2 21*  --   --   BUN 26*  --   --   CREATININE 1.59*  --   --   GLUCOSE 119*  --   --   CALCIUM 9.8  --   --   MG  --  2.1  --    Recent Labs  Lab 11/18/22 1718  AST 24  ALT 31  ALKPHOS 55    Recent Labs  Lab 11/18/22 1718 11/19/22 0007  WBC 6.8 7.3  HGB 15.4 15.3  HCT 47.0 46.5  PLT 263 239   No results for input(s): "APTT", "INR" in the last 168 hours. Computed MELD 3.0 unavailable. One or more values for this score either were not found within the given timeframe or did not fit some other criterion. Computed MELD-Na unavailable. One or more values for this score either were not found within the given timeframe or did not fit some other criterion.   Imaging Studies  11/18/2022 CT abdomen pelvis IMPRESSION: Sigmoid volvulus with markedly distended colon. Aortic atherosclerosis.  April 2024 CT abdomen pelvis without  contrast IMPRESSION: 1. Distended bladder with surrounding fat stranding, associated mild bilateral hydroureteronephrosis and moderate perinephric stranding. No obstructing stone. Findings are concerning for cystitis with possible ascending urinary tract infection in the setting of chronic bladder outlet obstruction due to prostatomegaly. Correlate with urinalysis. 2. Bilateral L5 spondylolysis with associated grade 1 anterolisthesis of L5 on S1. 3. Aortic Atherosclerosis (ICD10-I70.0).  GI Procedures and Studies  2016 colonoscopy Normal colonoscopy Suboptimal preparation. No gross lesions Cecal intubation took 15 minutes while withdrawal took 7 minutes Plan for 5-year follow-up but patient did not come   Assessment  Mr. Schroll is a 67 y.o. male with a pmh significant for CHF, CAD, PAF (on anticoagulation), prior CVA, hypertension, hyperlipidemia, CRI, arthritis, diabetes, BPH (status post interventions to decrease prostate size).  The GI service is consulted for evaluation and management of progressive abdominal pain with imaging consistent with sigmoid volvulus.  The patient is hemodynamically stable.  Clinically however he has evidence of progressive abdominal pain and discomfort with cross-sectional imaging consistent with a sigmoid volvulus.  Looking back on prior imaging, he has what appears to be a redundant sigmoid colon, so I suspect that this is the nidus of  his issue.  Based on his previous colonoscopy report from 2016, suspect that his redundancy is what made his colonoscopy more difficult for completion.  In any case, as we been asked by our surgical colleagues to attempt decompression endoscopically in an effort of trying to prevent emergent surgery, we will move forward with urgent sigmoid decompression attempted this morning.  Patient aware that he may still likely need surgery at some point to try to prevent him from having this recurrent.  His initial potassium level was 3.0  and on repeat was 3.1 and he is received now 2 rounds of potassium to try to optimize him.  Appreciate our anesthesia team with ensuring his overall clinical stability for [place] to proceed with his endoscopic procedure.  If for some reason they feel uncomfortable we will hold off, but at this point seems like we will move forward with that urgently. The risks and benefits of endoscopic evaluation were discussed with the patient; these include but are not limited to the risk of perforation, infection, bleeding, missed lesions, lack of diagnosis, severe illness requiring longer hospitalization, death, as well as anesthesia and sedation related illnesses.  The patient and/or family is agreeable to proceed.  All patient questions were answered to the best of my ability, and the patient agrees to the aforementioned plan of action with follow-up as indicated.   Plan/Recommendations  Remain n.p.o. Fulfill/complete potassium repletion runs Proceed with attempt at urgent sigmoid decompression endoscopically with anesthesia availability Hold VTE prophylaxis and anticoagulation for now Surgery to dictate next steps in evaluation and treatment but suspect surgical interventions will be required at some point hopefully not urgently/emergently Patient to be admitted formally to the hospital after completion of procedure versus sending back to the ED for further hospitalization needs Eventually will need to be on a very good bowel regimen   Thank you for this consult.  We will continue to follow.  Please page/call with questions or concerns.   Corliss Parish, MD [facility] Office # [phone]

## 2022-11-19 NOTE — Transfer of Care (Signed)
Immediate Anesthesia Transfer of Care Note  Patient: [name]  Procedure(s) Performed: FLEXIBLE SIGMOIDOSCOPY  Patient Location: PACU  Anesthesia Type:MAC  Level of Consciousness: sedated and responds to stimulation  Airway & Oxygen Therapy: Patient Spontanous Breathing and Patient connected to nasal cannula oxygen  Post-op Assessment: Report given to RN and Post -op Vital signs reviewed and stable  Post vital signs: Reviewed and stable  Last Vitals:  Vitals Value Taken Time  BP 85/55 11/19/22 0721  Temp    Pulse 87 11/19/22 0723  Resp 12 11/19/22 0723  SpO2 98 % 11/19/22 0723  Vitals shown include unfiled device data.  Last Pain:  Vitals:   11/19/22 0526  TempSrc: Temporal  PainSc: 10-Worst pain ever         Complications: No notable events documented.

## 2022-11-19 NOTE — Consult Note (Signed)
Reason for Consult/Chief Complaint: Sigmoid Volvulus Consultant: Donata Duff, MD  [name] is an 67 y.o. male.   HPI: Patient is a 67 yo M with complex medical history: CAD, CHF, CKD, DM, GERD, gout, HTN, HLD, Afib c/b stroke with some residual left sided weakness now on Eliquis who presents to ED for abdominal distension. Patient states he started to feel bloated on Friday, He had his last fully formed BM on Friday. States he had some small volume diarrhea earlier in the day. Denies nausea/emesis. States he feels incredibly bloated and it is causing abdominal discomfort. Voiding without issue. Has not had fevers or chills.Labs in ED show hypokalemia to 3.0 which ED has ordered supplementation, and hyponatremia to 133. Patient has AKI with creatinine of 1.59, seems to be somewhere near his baseline of 1.3-1.6 over last several months given CKD stage 3. Patient is hemodynamically stable in ED with rate controlled Afib and normotensive. Patient states his last dose of Eliquis was yesterday (Wednesday 11/6) AM.  Past Medical History:  Diagnosis Date   A-fib (HCC) 11/19/2010   Acute exacerbation of congestive heart failure (HCC) 11/19/2010   Arthritis    CAD (coronary artery disease) 11/20/2010   CHF (congestive heart failure) (HCC)    CKD (chronic kidney disease) stage 3, GFR 30-59 ml/min (HCC) 05/04/2017   Coronary artery disease    Diabetes type 2, controlled (HCC) 11/19/2010   GERD (gastroesophageal reflux disease) 02/08/2015   Gout    Gout 11/20/2010   History of cerebrovascular accident (CVA) with residual deficit 05/04/2017   History of CVA (cerebrovascular accident) 11/19/2010   HTN (hypertension) 07/01/2012   Hyperlipemia 11/21/2010   Hypertension    Hypertensive emergency 11/19/2010   ICH (intracerebral hemorrhage) (HCC) 12/22/2010   Physical deconditioning 12/22/2010   Pulmonary edema 11/19/2010   Respiratory failure (HCC) 11/19/2010   Shortness of breath    Stroke  (HCC) 11/19/2010   Thyroiditis 11/20/2010    Past Surgical History:  Procedure Laterality Date   IR ANGIOGRAM PELVIS SELECTIVE OR SUPRASELECTIVE  08/25/2022   IR ANGIOGRAM SELECTIVE EACH ADDITIONAL VESSEL  08/25/2022   IR ANGIOGRAM SELECTIVE EACH ADDITIONAL VESSEL  08/25/2022   IR EMBO TUMOR ORGAN ISCHEMIA INFARCT INC GUIDE ROADMAPPING  08/25/2022   IR RADIOLOGIST EVAL & MGMT  07/13/2022   IR RADIOLOGIST EVAL & MGMT  09/25/2022   IR US GUIDE VASC ACCESS LEFT  08/25/2022   IR US GUIDE VASC ACCESS LEFT  08/25/2022   IR US GUIDE VASC ACCESS RIGHT  08/25/2022   PEG PLACEMENT  12/03/2010   Procedure: PERCUTANEOUS ENDOSCOPIC GASTROSTOMY (PEG) PLACEMENT;  Surgeon: Hart Carwin, MD;  Location: [facility] ENDOSCOPY;  Service: Endoscopy;  Laterality: N/A;   TRACHEOSTOMY TUBE PLACEMENT  11/28/2010   Procedure: TRACHEOSTOMY;  Surgeon: Susy Frizzle, MD;  Location: [facility] OR;  Service: ENT;  Laterality: N/A;    Family History  Problem Relation Age of Onset   Diabetes Mother    Diabetes Brother    Colon cancer Neg Hx    Colon polyps Neg Hx    Kidney disease Neg Hx    Esophageal cancer Neg Hx    Heart disease Neg Hx    Gallbladder disease Neg Hx     Social History:  reports that he quit smoking about 16 years ago. His smoking use included cigarettes. He has never used smokeless tobacco. He reports that he does not drink alcohol and does not use drugs.  Allergies: No Known Allergies  Medications: I have reviewed the patient's current medications.  Results for orders placed or performed during the hospital encounter of 11/18/22 (from the past 48 hour(s))  Lipase, blood     Status: None   Collection Time: 11/18/22  5:18 PM  Result Value Ref Range   Lipase 26 11 - 51 U/L    Comment: Performed at [facility], [address] [place], [place] [address]  Comprehensive metabolic panel     Status: Abnormal   Collection Time: 11/18/22  5:18 PM  Result Value Ref Range   Sodium 133 (L) 135 - 145 mmol/L    Potassium 3.0 (L) 3.5 - 5.1 mmol/L   Chloride 101 98 - 111 mmol/L   CO2 21 (L) 22 - 32 mmol/L   Glucose, Bld 119 (H) 70 - 99 mg/dL    Comment: Glucose reference range applies only to samples taken after fasting for at least 8 hours.   BUN 26 (H) 8 - 23 mg/dL   Creatinine, Ser 5.57 (H) 0.61 - 1.24 mg/dL   Calcium 9.8 8.9 - 32.2 mg/dL   Total Protein 7.3 6.5 - 8.1 g/dL   Albumin 3.9 3.5 - 5.0 g/dL   AST 24 15 - 41 U/L   ALT 31 0 - 44 U/L   Alkaline Phosphatase 55 38 - 126 U/L   Total Bilirubin 0.9 <1.2 mg/dL   GFR, Estimated 47 (L) >60 mL/min    Comment: (NOTE) Calculated using the CKD-EPI Creatinine Equation (2021)    Anion gap 11 5 - 15    Comment: Performed at [facility], [address], [place], [place] [address]  CBC     Status: None   Collection Time: 11/18/22  5:18 PM  Result Value Ref Range   WBC 6.8 4.0 - 10.5 K/uL   RBC 5.21 4.22 - 5.81 MIL/uL   Hemoglobin 15.4 13.0 - 17.0 g/dL   HCT 70.6 23.7 - 62.8 %   MCV 90.2 80.0 - 100.0 fL   MCH 29.6 26.0 - 34.0 pg   MCHC 32.8 30.0 - 36.0 g/dL   RDW 31.5 17.6 - 16.0 %   Platelets 263 150 - 400 K/uL   nRBC 0.0 0.0 - 0.2 %    Comment: Performed at [facility], [address] [place], [place] [address]  CBC with Differential     Status: None   Collection Time: 11/19/22 12:07 AM  Result Value Ref Range   WBC 7.3 4.0 - 10.5 K/uL   RBC 5.15 4.22 - 5.81 MIL/uL   Hemoglobin 15.3 13.0 - 17.0 g/dL   HCT 62.6 94.8 - 54.6 %   MCV 90.3 80.0 - 100.0 fL   MCH 29.7 26.0 - 34.0 pg   MCHC 32.9 30.0 - 36.0 g/dL   RDW 27.0 35.0 - 09.3 %   Platelets 239 150 - 400 K/uL   nRBC 0.0 0.0 - 0.2 %   Neutrophils Relative % 59 %   Neutro Abs 4.3 1.7 - 7.7 K/uL   Lymphocytes Relative 32 %   Lymphs Abs 2.3 0.7 - 4.0 K/uL   Monocytes Relative 9 %   Monocytes Absolute 0.7 0.1 - 1.0 K/uL   Eosinophils Relative 0 %   Eosinophils Absolute 0.0 0.0 - 0.5 K/uL   Basophils Relative 0 %   Basophils Absolute 0.0 0.0 - 0.1 K/uL   Immature  Granulocytes 0 %   Abs Immature Granulocytes 0.02 0.00 - 0.07 K/uL    Comment:  Performed at [facility], [address] [place], [place] [address]  Lactic acid, plasma     Status: None   Collection Time: 11/19/22 12:07 AM  Result Value Ref Range   Lactic Acid, Venous 1.2 0.5 - 1.9 mmol/L    Comment: Performed at [facility], [address] [place], [place] [address]  Magnesium     Status: None   Collection Time: 11/19/22 12:07 AM  Result Value Ref Range   Magnesium 2.1 1.7 - 2.4 mg/dL    Comment: Performed at [facility], [address], [place], [place] [address]  Potassium     Status: Abnormal   Collection Time: 11/19/22 12:07 AM  Result Value Ref Range   Potassium 3.1 (L) 3.5 - 5.1 mmol/L    Comment: Performed at [facility], [address], [place], [place] [address]    CT ABDOMEN PELVIS W CONTRAST  Result Date: 11/18/2022 CLINICAL DATA:  Abdominal pain EXAM: CT ABDOMEN AND PELVIS WITH CONTRAST TECHNIQUE: Multidetector CT imaging of the abdomen and pelvis was performed using the standard protocol following bolus administration of intravenous contrast. RADIATION DOSE REDUCTION: This exam was performed according to the departmental dose-optimization program which includes automated exposure control, adjustment of the mA and/or kV according to patient size and/or use of iterative reconstruction technique. CONTRAST:  60mL OMNIPAQUE IOHEXOL 350 MG/ML SOLN COMPARISON:  04/13/2022 FINDINGS: Lower chest: Coronary artery and aortic calcifications. No acute abnormality. Hepatobiliary: No focal hepatic abnormality. Gallbladder unremarkable. Pancreas: No focal abnormality or ductal dilatation. Spleen: No focal abnormality.  Normal size. Adrenals/Urinary Tract: Adrenal glands normal. Benign-appearing cyst in the upper pole left kidney. No follow-up imaging recommended. No stones or hydronephrosis. Urinary bladder unremarkable. Stomach/Bowel: Markedly distended colon  measuring up to 10 cm in the cecum and 9 cm in the transverse colon. Stomach and small bowel are decompressed. Swirled appearance of the mesentery leading to the sigmoid colon compatible with sigmoid volvulus. Vascular/Lymphatic: No evidence of aneurysm or adenopathy. Aortic atherosclerosis. Reproductive: No visible focal abnormality. Other: No free fluid or free air. Musculoskeletal: No acute bony abnormality. IMPRESSION: Sigmoid volvulus with markedly distended colon. Aortic atherosclerosis. These results were called by telephone at the time of interpretation on 11/18/2022 at 11:34 pm to provider Dr. Delena Serve, Who verbally acknowledged these results. Electronically Signed   By: Charlett Nose M.D.   On: 11/18/2022 23:38    10 point review of systems is negative except as listed above in HPI.   Physical Exam Blood pressure (!) 116/90, pulse 81, temperature (!) 97.3 F (36.3 C), temperature source Oral, resp. rate 10, height 5\' 11"  (1.803 m), weight 87.1 kg, SpO2 99%. Constitutional: well-developed, well-nourished HEENT: PERRL, NCAT Neck: no thyromegaly, trachea midline Chest: normal work of breathing, equal chest rise Abdomen: Distended, tympanic to percussion, no rebound or guarding, non peritonitic, discomfort to palpation but not tender Extremities:Warm, edematous MSK: Normal range of motion Skin: warm, dry, no rashes Psych: normal memory, normal mood/affect  Neuro: normal gross motor function and sensation, decreased fine motor skills on right, mildly dysarthric but this is baseline per patient    Assessment/Plan: 67 yo M with sigmoid volvulus  - I have discussed with Dr. Meridee Score. He will discuss with anesthesia and plan to mobilize team for urgent flex sigmoidoscopy with decompression/detorsion ~4am. - Recommend serial lactic acid and abdominal exams. Lactate is wnl at 1.2 at midnight. If lactate increases or patient becomes peritonitic this would be more concerning for bowel  ischemia  and/or perforation and would require emergent operation if GI unable to intervene - Discussed in detail with patient that he is high risk for an operation given his complex comorbidities but that may be the only option if he clinically deteriorates before endoscopic decompression/detorsion. If he requires exploratory laparotomy without prior decompression he will most likely need a colostomy and we discussed that although reversible, given his high operative risk and age there is a chance a risk of reversal outweighs the benefit and his colostomy would be permanent. Patient is agreeable to endoscopic intervention. He does not want an operation but if emergent without other options then will reconsider. - Recommend medicine admission given multiple complex comorbidities. - Given his recent Eliquis use and at minimum requiring a procedure with scope and detorsion and still with possibility of needing a surgical operation, recommend reversal of anticoagulant with Adnexxa FEN - strict NPO DVT - SCDs,  will hold [facility] tonight given recent Eliquis use, need for reversal. Will reassess after intervention and likely start prophylactic dosing later today. Dispo -  Medicine admission, GI for urgent decompression/detorsion this AM, surgery will follow closely.    I spent a total of 85 minutes in both face-to-face and non-face-to-face activities, excluding procedures performed, for this visit on the date of this encounter. Personally reviewed all imaging, labs, notes. Discussed patient and plan of care with ED provider Sharilyn Sites PA-C and Gastroenterologist Dr. Meridee Score. This required a high level of decision making.    Donata Duff, MD [facility] Surgery

## 2022-11-19 NOTE — Op Note (Signed)
[facility] Patient Name: [name] Procedure Date : 11/19/2022 MRN: [id number] Attending MD: Corliss Parish , MD, [id number] Date of Birth: Dec 31, 1955 CSN: [id number] Age: 67 Admit Type: Inpatient Procedure:                Flexible Sigmoidoscopy Indications:              Abnormal CT of the GI tract, Volvulus, For therapy                            of volvulus Providers:                Corliss Parish, MD, Fransisca Connors, Rozetta Nunnery, Technician Referring MD:             Inpatient service Medicines:                Monitored Anesthesia Care Complications:            No immediate complications. Estimated Blood Loss:     Estimated blood loss was minimal. Procedure:                Pre-Anesthesia Assessment:                           - Prior to the procedure, a History and Physical                            was performed, and patient medications and                            allergies were reviewed. The patient's tolerance of                            previous anesthesia was also reviewed. The risks                            and benefits of the procedure and the sedation                            options and risks were discussed with the patient.                            All questions were answered, and informed consent                            was obtained. Prior Anticoagulants: The patient has                            taken Eliquis (apixaban), last dose was 1 day prior                            to procedure. ASA Grade Assessment: IV - A patient  with severe systemic disease that is a constant                            threat to life. After reviewing the risks and                            benefits, the patient was deemed in satisfactory                            condition to undergo the procedure.                           After obtaining informed consent, the scope was                             passed under direct vision. The [id number]                            ([id number]) Olympus endoscope was introduced through                            the anus and advanced to the the descending colon.                            The flexible sigmoidoscopy was accomplished without                            difficulty. The patient tolerated the procedure.                            The quality of the bowel preparation was poor. The                            [id number] ([id number]) Olympus coloscope was                            introduced through the anus and advanced to the the                            descending colon. Scope In: Scope Out: Findings:      The digital rectal exam findings include hemorrhoids. Pertinent       negatives include no palpable rectal lesions.      Copious quantities of semi-liquid stool was found in the entire colon,       making visualization difficult. Lavage of the area was performed,       resulting in incomplete clearance with continued poor visualization.      The lumen of the sigmoid colon and descending colon was grossly dilated.      A volvulus with viable appearing mucosa was found in the recto-sigmoid       colon/sigmoid colon junction (~25-30 cm). Suction via Endoscope was       performed and we removed 1300 mL of stool (continued to have issues with       scope suction due to the stool clogging. Decompression  of the volvulus       was attempted and was successful, with partial decompression achieved (I       can still see volvulus present at 25-30 cm but significantly less       distended. Thus this was reasoning to pursue tube decompression as well.       Following the suction maneuver, a tube was placed to maintain the       decompression.      The lumen of the rectum was moderately dilated.      Non-bleeding non-thrombosed external and internal hemorrhoids were found       during perianal exam and during digital exam. The hemorrhoids were  Grade       II (internal hemorrhoids that prolapse but reduce spontaneously). Impression:               - Preparation of the colon was poor.                           - Hemorrhoids found on digital rectal exam.                           - Stool in the entire examined colon.                           - Dilated in the sigmoid colon and in the                            descending colon.                           - Volvulus. Successful complete decompression                            achieved with both suction decompression and with                            tube placement. High concern that this may remain                            an issue due to his significant sigmoid colon                            redundancy as per prior imaging. Think surgery may                            still be required during admission?                           - Dilated in the rectum.                           - Non-bleeding non-thrombosed external and internal                            hemorrhoids. Recommendation:           - The patient will be observed post-procedure,  until all discharge criteria are met.                           - Return patient to hospital ward for ongoing care.                           - Observe clinical course.                           - KUB in 2-3 hours.                           - Optimize electrolytes.                           - NPO until surgical service feels safe.                           - Further management as per surgical service.                            Concern is that there is volvulus, but with                            potential significant redundancy of the sigmoid                            colon presumptively, that this has high risk for                            recurrence and repeat volvulus treatment, does not                            make ideal sense but ultimately up to patient and                            surgical  service.                           - Full colonoscopy at some point could be                            considered, when healthy.                           - The findings and recommendations were discussed                            with the patient.                           - The findings and recommendations were discussed                            with the referring physician. Procedure Code(s):        ---  Professional ---                           [phone], Sigmoidoscopy, flexible; with decompression                            (for pathologic distention) (eg, volvulus,                            megacolon), including placement of decompression                            tube, when performed Diagnosis Code(s):        --- Professional ---                           K64.1, Second degree hemorrhoids                           K59.39, Other megacolon                           K56.2, Volvulus                           R93.3, Abnormal findings on diagnostic imaging of                            other parts of digestive tract CPT copyright 2022 [employer]. All rights reserved. The codes documented in this report are preliminary and upon coder review may  be revised to meet current compliance requirements. Corliss Parish, MD 11/19/2022 7:21:20 AM Number of Addenda: 0

## 2022-11-19 NOTE — Care Management Obs Status (Signed)
MEDICARE OBSERVATION STATUS NOTIFICATION   Patient Details  Name: [name] MRN: [id number] Date of Birth: 03-Jan-1956   Medicare Observation Status Notification Given:  Yes    Oletta Cohn, RN 11/19/2022, 9:16 AM

## 2022-11-19 NOTE — H&P (Signed)
GASTROENTEROLOGY PROCEDURE H&P NOTE   Primary Care Physician: Venita Sheffield, MD  HPI: [name] is a 67 y.o. male who presents for Sigmoidoscopy for attempt at treatment of sigmoid volvulus.  Past Medical History:  Diagnosis Date   A-fib (HCC) 11/19/2010   Acute exacerbation of congestive heart failure (HCC) 11/19/2010   Arthritis    CAD (coronary artery disease) 11/20/2010   CHF (congestive heart failure) (HCC)    CKD (chronic kidney disease) stage 3, GFR 30-59 ml/min (HCC) 05/04/2017   Coronary artery disease    Diabetes type 2, controlled (HCC) 11/19/2010   GERD (gastroesophageal reflux disease) 02/08/2015   Gout    Gout 11/20/2010   History of cerebrovascular accident (CVA) with residual deficit 05/04/2017   History of CVA (cerebrovascular accident) 11/19/2010   HTN (hypertension) 07/01/2012   Hyperlipemia 11/21/2010   Hypertension    Hypertensive emergency 11/19/2010   ICH (intracerebral hemorrhage) (HCC) 12/22/2010   Physical deconditioning 12/22/2010   Pulmonary edema 11/19/2010   Respiratory failure (HCC) 11/19/2010   Shortness of breath    Stroke (HCC) 11/19/2010   Thyroiditis 11/20/2010   Past Surgical History:  Procedure Laterality Date   IR ANGIOGRAM PELVIS SELECTIVE OR SUPRASELECTIVE  08/25/2022   IR ANGIOGRAM SELECTIVE EACH ADDITIONAL VESSEL  08/25/2022   IR ANGIOGRAM SELECTIVE EACH ADDITIONAL VESSEL  08/25/2022   IR EMBO TUMOR ORGAN ISCHEMIA INFARCT INC GUIDE ROADMAPPING  08/25/2022   IR RADIOLOGIST EVAL & MGMT  07/13/2022   IR RADIOLOGIST EVAL & MGMT  09/25/2022   IR US GUIDE VASC ACCESS LEFT  08/25/2022   IR US GUIDE VASC ACCESS LEFT  08/25/2022   IR US GUIDE VASC ACCESS RIGHT  08/25/2022   PEG PLACEMENT  12/03/2010   Procedure: PERCUTANEOUS ENDOSCOPIC GASTROSTOMY (PEG) PLACEMENT;  Surgeon: Hart Carwin, MD;  Location: [facility] ENDOSCOPY;  Service: Endoscopy;  Laterality: N/A;   TRACHEOSTOMY TUBE PLACEMENT  11/28/2010   Procedure: TRACHEOSTOMY;   Surgeon: Susy Frizzle, MD;  Location: [facility] OR;  Service: ENT;  Laterality: N/A;   No current facility-administered medications for this encounter.   No current facility-administered medications for this encounter. No Known Allergies Family History  Problem Relation Age of Onset   Diabetes Mother    Diabetes Brother    Colon cancer Neg Hx    Colon polyps Neg Hx    Kidney disease Neg Hx    Esophageal cancer Neg Hx    Heart disease Neg Hx    Gallbladder disease Neg Hx    Social History   Socioeconomic History   Marital status: Single    Spouse name: Not on file   Number of children: 0   Years of education: Not on file   Highest education level: Not on file  Occupational History   Occupation: Disability  Tobacco Use   Smoking status: Former    Current packs/day: 0.00    Types: Cigarettes    Quit date: 12/19/2005    Years since quitting: 16.9   Smokeless tobacco: Never  Vaping Use   Vaping status: Never Used  Substance and Sexual Activity   Alcohol use: No    Alcohol/week: 11.0 standard drinks of alcohol    Types: 6 Glasses of wine, 5 Shots of liquor per week    Comment: quit in 2012   Drug use: No   Sexual activity: Not on file  Other Topics Concern   Not on file  Social History Narrative   Diet: No  Caffeine: Yes      Married, if yes what year: No      Do you live in a house, apartment, assisted living, condo, trailer, ect: House      Is it one or more stories: No      How many persons live in your home? One      Pets: No      Highest level or education completed: 4 year college      Current/Past profession: Business       Exercise: Yes                 Type and how often: Every other day         Living Will: Yes   DNR: No   POA/HPOA: Yes      Functional Status:   Do you have difficulty bathing or dressing yourself? No   Do you have difficulty preparing food or eating? No   Do you have difficulty managing your medications? No   Do you have  difficulty managing your finances? No   Do you have difficulty affording your medications? No   Social Determinants of Health   Financial Resource Strain: Not on file  Food Insecurity: No Food Insecurity (04/20/2022)   Hunger Vital Sign    Worried About Running Out of Food in the Last Year: Never true    Ran Out of Food in the Last Year: Never true  Transportation Needs: Unmet Transportation Needs (04/22/2022)   PRAPARE - Administrator, Civil Service (Medical): Yes    Lack of Transportation (Non-Medical): Yes  Physical Activity: Not on file  Stress: Not on file  Social Connections: Not on file  Intimate Partner Violence: Not on file    Physical Exam: Today's Vitals   11/19/22 0500 11/19/22 0507 11/19/22 0510 11/19/22 0526  BP:  (!) 127/102  (!) 125/93  Pulse: 78 70 83 82  Resp: 11 18 16 17   Temp:    (!) 97.3 F (36.3 C)  TempSrc:    Temporal  SpO2: 99% 100% 100% 96%  Weight:      Height:      PainSc:    10-Worst pain ever   Body mass index is 26.78 kg/m. GEN: NAD EYE: Sclerae anicteric ENT: MMM CV: Non-tachycardic GI: Protuberant, distended abdomen, TTP (10/10 pain currently) NEURO:  Alert & Oriented x 3  Lab Results: Recent Labs    11/18/22 1718 11/19/22 0007  WBC 6.8 7.3  HGB 15.4 15.3  HCT 47.0 46.5  PLT 263 239   BMET Recent Labs    11/18/22 1718 11/19/22 0007 11/19/22 0223  NA 133*  --   --   K 3.0* 3.1* 3.3*  CL 101  --   --   CO2 21*  --   --   GLUCOSE 119*  --   --   BUN 26*  --   --   CREATININE 1.59*  --   --   CALCIUM 9.8  --   --    LFT Recent Labs    11/18/22 1718  PROT 7.3  ALBUMIN 3.9  AST 24  ALT 31  ALKPHOS 55  BILITOT 0.9   PT/INR No results for input(s): "LABPROT", "INR" in the last 72 hours.   Impression / Plan: This is a 67 y.o.male who presents for Sigmoidoscopy for attempt at treatment of sigmoid volvulus.  Increased risk Sigmoidoscopy based on patient's clinical status and pain. Discussed increased  risk with  patient and he agrees to move forward understanding possibility of death as well.  The risks and benefits of endoscopic evaluation/treatment were discussed with the patient and/or family; these include but are not limited to the risk of perforation, infection, bleeding, missed lesions, lack of diagnosis, severe illness requiring hospitalization, as well as anesthesia and sedation related illnesses.  The patient's history has been reviewed, patient examined, no change in status, and deemed stable for procedure.  The patient and/or family is agreeable to proceed.    Corliss Parish, MD [facility] Advanced Endoscopy Office # [phone]

## 2022-11-20 ENCOUNTER — Encounter (HOSPITAL_COMMUNITY): Payer: Self-pay | Admitting: Gastroenterology

## 2022-11-20 DIAGNOSIS — K562 Volvulus: Secondary | ICD-10-CM | POA: Diagnosis not present

## 2022-11-20 LAB — CBC
HCT: 47.1 % (ref 39.0–52.0)
Hemoglobin: 16 g/dL (ref 13.0–17.0)
MCH: 30.2 pg (ref 26.0–34.0)
MCHC: 34 g/dL (ref 30.0–36.0)
MCV: 89 fL (ref 80.0–100.0)
Platelets: 230 10*3/uL (ref 150–400)
RBC: 5.29 MIL/uL (ref 4.22–5.81)
RDW: 14.1 % (ref 11.5–15.5)
WBC: 5.8 10*3/uL (ref 4.0–10.5)
nRBC: 0 % (ref 0.0–0.2)

## 2022-11-20 LAB — BASIC METABOLIC PANEL
Anion gap: 9 (ref 5–15)
BUN: 22 mg/dL (ref 8–23)
CO2: 21 mmol/L — ABNORMAL LOW (ref 22–32)
Calcium: 8.9 mg/dL (ref 8.9–10.3)
Chloride: 104 mmol/L (ref 98–111)
Creatinine, Ser: 1.42 mg/dL — ABNORMAL HIGH (ref 0.61–1.24)
GFR, Estimated: 54 mL/min — ABNORMAL LOW (ref >60)
Glucose, Bld: 107 mg/dL — ABNORMAL HIGH (ref 70–99)
Potassium: 2.3 mmol/L — CL (ref 3.5–5.1)
Sodium: 134 mmol/L — ABNORMAL LOW (ref 135–145)

## 2022-11-20 LAB — GLUCOSE, CAPILLARY
Glucose-Capillary: 171 mg/dL — ABNORMAL HIGH (ref 70–99)
Glucose-Capillary: 95 mg/dL (ref 70–99)
Glucose-Capillary: 159 mg/dL — ABNORMAL HIGH (ref 70–99)
Glucose-Capillary: 175 mg/dL — ABNORMAL HIGH (ref 70–99)

## 2022-11-20 LAB — POTASSIUM: Potassium: 2.8 mmol/L — ABNORMAL LOW (ref 3.5–5.1)

## 2022-11-20 LAB — MAGNESIUM
Magnesium: 1.9 mg/dL (ref 1.7–2.4)
Magnesium: 2.5 mg/dL — ABNORMAL HIGH (ref 1.7–2.4)

## 2022-11-20 LAB — PHOSPHORUS: Phosphorus: 3 mg/dL (ref 2.5–4.6)

## 2022-11-20 LAB — LACTIC ACID, PLASMA: Lactic Acid, Venous: 1.3 mmol/L (ref 0.5–1.9)

## 2022-11-20 MED ORDER — POTASSIUM CHLORIDE CRYS ER 20 MEQ PO TBCR
40.0000 meq | EXTENDED_RELEASE_TABLET | ORAL | Status: AC
  Administered 2022-11-20 (×3): 40 meq via ORAL
  Filled 2022-11-20 (×3): qty 2

## 2022-11-20 MED ORDER — PRAVASTATIN SODIUM 40 MG PO TABS
40.0000 mg | ORAL_TABLET | Freq: Every day | ORAL | Status: DC
  Administered 2022-11-20 – 2022-11-21 (×2): 40 mg via ORAL
  Filled 2022-11-20 (×2): qty 1

## 2022-11-20 MED ORDER — MAGNESIUM SULFATE 2 GM/50ML IV SOLN
2.0000 g | Freq: Once | INTRAVENOUS | Status: AC
  Administered 2022-11-20: 2 g via INTRAVENOUS
  Filled 2022-11-20: qty 50

## 2022-11-20 NOTE — Plan of Care (Signed)
  Problem: Coping: Goal: Ability to adjust to condition or change in health will improve Outcome: Progressing   Problem: Nutritional: Goal: Maintenance of adequate nutrition will improve Outcome: Progressing   Problem: Nutrition: Goal: Adequate nutrition will be maintained Outcome: Progressing   Problem: Pain Management: Goal: General experience of comfort will improve Outcome: Progressing   Problem: Coping: Goal: Ability to adjust to condition or change in health will improve Outcome: Progressing

## 2022-11-20 NOTE — Anesthesia Postprocedure Evaluation (Signed)
Anesthesia Post Note  Patient: [name]  Procedure(s) Performed: FLEXIBLE SIGMOIDOSCOPY BOWEL DECOMPRESSION     Patient location during evaluation: PACU Anesthesia Type: MAC Level of consciousness: awake Pain management: pain level controlled Vital Signs Assessment: post-procedure vital signs reviewed and stable Respiratory status: spontaneous breathing, nonlabored ventilation and respiratory function stable Cardiovascular status: blood pressure returned to baseline and stable Postop Assessment: no apparent nausea or vomiting Anesthetic complications: no   There were no known notable events for this encounter.  Last Vitals:  Vitals:   11/20/22 0307 11/20/22 0807  BP: 108/77 105/82  Pulse: 67 77  Resp: 18 19  Temp:  36.6 C  SpO2: 99% 94%    Last Pain:  Vitals:   11/20/22 0828  TempSrc:   PainSc: 0-No pain                 [name]

## 2022-11-20 NOTE — Progress Notes (Signed)
PROGRESS NOTE    VU YZAGUIRRE  LFY:[id number] DOB: August 20, 1955 DOA: 11/18/2022 PCP: Venita Sheffield, MD    Brief Narrative:   [name] is a 67 y.o. male with past medical history significant for HTN, HLD, DM2, chronic diastolic congestive heart failure, paroxysmal atrial fibrillation, CAD, history of CVA/ICH with residual deficit, CKD stage IIIb, GERD, OA, gout, history of appendectomy and PEG placement with takedown who presented to [facility] ED after being seen at [facility] care for evaluation of acute abdominal pain.  Patient reports distention and constipation over the previous 6 days.  Concern for small bowel obstruction and sent to the ED for further evaluation.  In the ED, temperature 98.1 F, HR 73, RR 11, BP 111/89, SpO2 98% room air.  WBC 6.8, hemoglobin 15.4, platelet count 263.  Sodium 133, potassium 3.0, chloride 101, CO2 21, glucose 119, BUN 26, creat 1.59.  Total bilirubin 0.9.  AST 24, ALT 31, lipase 26.  Lactic acid 1.2.  CT abdomen/pelvis with sigmoid volvulus with markedly distended colon.  EDP consulted GI and general surgery.  Potassium repleted.  TRH consulted for admission for further evaluation management of sigmoid volvulus.  Assessment & Plan:    Sigmoid volvulus Patient presenting with progressive abdominal pain, distention and constipation over the previous 6 days.  Patient was afebrile without leukocytosis.  Lactic acid within normal limits.  CT abdomen/pelvis with notable sigmoid volvulus with markedly distended colon.  GI and general surgery were consulted.  Patient underwent flexible sigmoidoscopy with successful suction decompression of volvulus by Dr. Meridee Score on 11/7.  Given redundancy of sigmoid colon, patient deemed high risk of recurrence of volvulus and recommended general surgery for sigmoid plexy versus sigmoidectomy. -- General Surgery following, appreciate assistance -- Full liquid diet, further advancement per general surgery -- General  Surgery discussed potential sigmoid plexi versus sigmoidectomy  Hypokalemia Potassium 2.3, will replete. -- Follow electrolytes daily and replete as needed  Essential hypertension Chronic diastolic congestive heart failure Home regimen includes carvedilol 25 m p.o. twice daily, Cardizem 120 mg p.o. twice daily, Jardiance 10 mg p.o. daily, Imdur 30 mg p.o. daily, Aldactone 25 mg p.o. daily.  TTE 08/31/2012 with LVEF 55 to 60%. -- Continue to hold antihypertensives given borderline low blood pressure  Type 2 diabetes mellitus Hemoglobin A1c 6.21 July 2022, well-controlled.  Home medications include Jardiance, metformin. -- Hold oral hypoglycemics while inpatient -- SSI for coverage -- CBGs qAC/HS  Paroxysmal atrial fibrillation Home regimen includes carvedilol 25 mg p.o. twice daily, Cardizem 120 mg p.o. twice daily. -- Continue to hold home carvedilol/Cardizem given borderline blood pressures -- Plan to resume slowly once BP improves -- Continue to hold home Eliquis given possible need of surgical intervention as above  Hyperlipidemia -- Pravastatin 40 mg p.o. daily  CAD -- Continue statin  History of CVA/ICH with residual deficit -- Continue statin  CKD stage IIIb Creatinine 1.42, stable. -- BMP daily  GERD -- Continue PPI  Osteoarthritis/gout -- Tylenol as needed  DVT prophylaxis: Lovenox    Code Status: Full Code Family Communication: No family present at bedside this morning  Disposition Plan:  Level of care: Telemetry Medical Status is: Inpatient Remains inpatient appropriate because: Electrolyte replacement, potential need of surgical resection of redundant sigmoid colon leading to sigmoid volvulus, will need further advancement of diet and general surgery signed off before stable for discharge home.    Consultants:  [facility]  Procedures:  Flex sigmoidoscopy, Dr. Meridee Score 11/7  Antimicrobials:  None   Subjective: Patient seen examined bedside, resting calmly.  Lying in bed.  Reports abdominal pain and distention improved since yesterday.  Discussed potential need of surgical resection of his sigmoid colon due to redundancy leading to sigmoid volvulus with high chance of recurrence.  Discussed with surgery PA this morning.  Replating electrolytes.  No other specific complaints or concerns at this time.  Denies headache, no dizziness, no chest pain, no palpitations, no shortness of breath, no abdominal pain, no fever/chills/night sweats, no nausea/vomiting/diarrhea, no focal weakness, no fatigue, no paresthesia.  No acute events overnight per nursing.  Objective: Vitals:   11/19/22 1552 11/19/22 1952 11/20/22 0307 11/20/22 0807  BP: (!) 117/100 111/85 108/77 105/82  Pulse: 91 86 67 77  Resp: 16 17 18 19   Temp: 97.7 F (36.5 C) 98.1 F (36.7 C)  97.9 F (36.6 C)  TempSrc: Oral Oral  Oral  SpO2: 100% 97% 99% 94%  Weight:      Height:       No intake or output data in the 24 hours ending 11/20/22 1006 Filed Weights   11/18/22 1658  Weight: 87.1 kg    Examination:  Physical Exam: GEN: NAD, alert and oriented x 3, wd/wn HEENT: NCAT, PERRL, EOMI, sclera clear, MMM PULM: CTAB w/o wheezes/crackles, normal respiratory effort, on room air CV: RRR w/o M/G/R GI: abd soft, mild distention, faint high-pitched tinkling bowel sounds noted, no rebound/guarding/masses MSK: no peripheral edema, muscle strength globally intact 5/5 bilateral upper/lower extremities NEURO: CN II-XII intact, no focal deficits, sensation to light touch intact PSYCH: normal mood/affect Integumentary: dry/intact, no rashes or wounds    Data Reviewed: I have personally reviewed following labs and imaging studies  CBC: Recent Labs  Lab 11/18/22 1718 11/19/22 0007 11/20/22 0637  WBC 6.8 7.3 5.8  NEUTROABS  --  4.3  --   HGB 15.4 15.3 16.0  HCT 47.0 46.5 47.1  MCV 90.2 90.3 89.0  PLT 263 239 230    Basic Metabolic Panel: Recent Labs  Lab 11/18/22 1718 11/19/22 0007 11/19/22 0223 11/20/22 0637  NA 133*  --   --  134*  K 3.0* 3.1* 3.3* 2.3*  CL 101  --   --  104  CO2 21*  --   --  21*  GLUCOSE 119*  --   --  107*  BUN 26*  --   --  22  CREATININE 1.59*  --   --  1.42*  CALCIUM 9.8  --   --  8.9  MG  --  2.1  --  1.9   GFR: Estimated Creatinine Clearance: 53 mL/min (A) (by C-G formula based on SCr of 1.42 mg/dL (H)). Liver Function Tests: Recent Labs  Lab 11/18/22 1718  AST 24  ALT 31  ALKPHOS 55  BILITOT 0.9  PROT 7.3  ALBUMIN 3.9   Recent Labs  Lab 11/18/22 1718  LIPASE 26   No results for input(s): "AMMONIA" in the last 168 hours. Coagulation Profile: No results for input(s): "INR", "PROTIME" in the last 168 hours. Cardiac Enzymes: No results for input(s): "CKTOTAL", "CKMB", "CKMBINDEX", "TROPONINI" in the last 168 hours. BNP (last 3 results) No results for input(s): "PROBNP" in the last 8760 hours. HbA1C: No results for input(s): "HGBA1C" in the last 72 hours. CBG: Recent Labs  Lab 11/19/22 0801 11/19/22 1130 11/19/22 1719 11/19/22 2215 11/20/22 0806  GLUCAP 95 103* 144* 103* 171*   Lipid Profile: No results for input(s): "CHOL", "HDL", "LDLCALC", "TRIG", "CHOLHDL", "  LDLDIRECT" in the last 72 hours. Thyroid Function Tests: No results for input(s): "TSH", "T4TOTAL", "FREET4", "T3FREE", "THYROIDAB" in the last 72 hours. Anemia Panel: No results for input(s): "VITAMINB12", "FOLATE", "FERRITIN", "TIBC", "IRON", "RETICCTPCT" in the last 72 hours. Sepsis Labs: Recent Labs  Lab 11/19/22 0007 11/19/22 0223 11/20/22 0807  LATICACIDVEN 1.2 1.0 1.3    No results found for this or any previous visit (from the past 240 hour(s)).       Radiology Studies: DG Abd 2 Views  Result Date: 11/19/2022 CLINICAL DATA:  6056 Volvulus (HCC) 6056. EXAM: ABDOMEN - 2 VIEW COMPARISON:  CT scan abdomen and pelvis from 11/18/2022. FINDINGS: There is interval  placement of rectal tube. There is significant interval decrease in the degree of colonic distention when compared to the prior CT scan. No pneumatosis or portal venous gas. No evidence of pneumoperitoneum. No acute osseous abnormalities. The soft tissues are within normal limits. Surgical changes, devices, tubes and lines: None. IMPRESSION: *Significant interval decrease in the degree of colonic distention when compared to the prior CT scan, secondary to rectal tube placement. Electronically Signed   By: Jules Schick M.D.   On: 11/19/2022 12:02   CT ABDOMEN PELVIS W CONTRAST  Result Date: 11/18/2022 CLINICAL DATA:  Abdominal pain EXAM: CT ABDOMEN AND PELVIS WITH CONTRAST TECHNIQUE: Multidetector CT imaging of the abdomen and pelvis was performed using the standard protocol following bolus administration of intravenous contrast. RADIATION DOSE REDUCTION: This exam was performed according to the departmental dose-optimization program which includes automated exposure control, adjustment of the mA and/or kV according to patient size and/or use of iterative reconstruction technique. CONTRAST:  60mL OMNIPAQUE IOHEXOL 350 MG/ML SOLN COMPARISON:  04/13/2022 FINDINGS: Lower chest: Coronary artery and aortic calcifications. No acute abnormality. Hepatobiliary: No focal hepatic abnormality. Gallbladder unremarkable. Pancreas: No focal abnormality or ductal dilatation. Spleen: No focal abnormality.  Normal size. Adrenals/Urinary Tract: Adrenal glands normal. Benign-appearing cyst in the upper pole left kidney. No follow-up imaging recommended. No stones or hydronephrosis. Urinary bladder unremarkable. Stomach/Bowel: Markedly distended colon measuring up to 10 cm in the cecum and 9 cm in the transverse colon. Stomach and small bowel are decompressed. Swirled appearance of the mesentery leading to the sigmoid colon compatible with sigmoid volvulus. Vascular/Lymphatic: No evidence of aneurysm or adenopathy. Aortic  atherosclerosis. Reproductive: No visible focal abnormality. Other: No free fluid or free air. Musculoskeletal: No acute bony abnormality. IMPRESSION: Sigmoid volvulus with markedly distended colon. Aortic atherosclerosis. These results were called by telephone at the time of interpretation on 11/18/2022 at 11:34 pm to provider Dr. Delena Serve, Who verbally acknowledged these results. Electronically Signed   By: Charlett Nose M.D.   On: 11/18/2022 23:38        Scheduled Meds:  enoxaparin (LOVENOX) injection  90 mg Subcutaneous BID   insulin aspart  0-5 Units Subcutaneous QHS   insulin aspart  0-9 Units Subcutaneous TID WC   pantoprazole  40 mg Oral Daily   potassium chloride  40 mEq Oral Q3H   Continuous Infusions:  magnesium sulfate bolus IVPB       LOS: 1 day    Time spent: 52 minutes spent on chart review, discussion with nursing staff, consultants, updating family and interview/physical exam; more than 50% of that time was spent in counseling and/or coordination of care.    Alvira Philips [place], DO [facility] Available via Epic secure chat 7am-7pm After these hours, please refer to coverage provider listed on [email or web address] 11/20/2022, 10:06 AM

## 2022-11-20 NOTE — Progress Notes (Signed)
   Progress Note  1 Day Post-Op  Interval: Has had 2BM since decompression/detorsion with GI yesterday. Patient passing flatus. Abdomen significantly less distended from presentation. States abdomen still more distended than his baseline.  Objective: Vital signs in last 24 hours: Temp:  [97.7 F (36.5 C)-98.5 F (36.9 C)] 97.9 F (36.6 C) (11/08 0807) Pulse Rate:  [67-91] 77 (11/08 0807) Resp:  [16-19] 19 (11/08 0807) BP: (105-117)/(77-100) 105/82 (11/08 0807) SpO2:  [94 %-100 %] 94 % (11/08 0807) Last BM Date : 11/20/22  Intake/Output from previous day: No intake/output data recorded. Intake/Output this shift: No intake/output data recorded.  PE: Gen: No acute distress CV: Rate controlled Afib Pulm: Normal work of breathing Abd: Soft, nontender, moderately distended, significantly improved from initial presentation.   Lab Results:  I have personally reviewed all labs for past 24h.  Studies/Results: I have personally reviewed and interpreted all imaging for past 24h. I agree with radiologist's interpretation.  Assessment/Plan 67 yo M with complex medical history who presented with sigmoid volvulus s/p decompression/detorsion with GI on 11/7.  - Advance to full liquids. If tolerates well and continues to pass flatus and have bowel movements and distension continues to improve we will consider advancing to soft diet in PM.  - Recommend aggressive electrolyte repletion. K > 4, Mg > 2, Phos > 3; would repeat PM labs to ensure adequately repleted.   LOS: 1 day   I reviewed Consultant GI notes, hospitalist notes, last 24 h vitals and pain scores, last 48 h intake and output, last 24 h labs and trends, and last 24 h imaging results.  This care required moderate level of medical decision making.    Lysle Rubens, MD [facility] 11/20/2022, 9:07 AM

## 2022-11-20 NOTE — Progress Notes (Signed)
   11/20/22 1332  Mobility  Activity Ambulated with assistance in hallway  Level of Assistance Contact guard assist, steadying assist  Assistive Device Front wheel walker  Distance Ambulated (ft) 125 ft  Activity Response Tolerated fair  Mobility Referral Yes  $Mobility charge 1 Mobility  Mobility Specialist Start Time (ACUTE ONLY) 1314  Mobility Specialist Stop Time (ACUTE ONLY) 1332  Mobility Specialist Time Calculation (min) (ACUTE ONLY) 18 min   Mobility Specialist: Progress Note  Pt agreeable to mobility session - received in bed. Required CG using RW, very slow, waddled gait with wide BOS, shorted shuffled steps with forward flexed trunk. C/o abd pain. Left in BR with all needs met - NT present.  Pt uses walker at baseline.   Barnie Mort, BS Mobility Specialist Please contact via SecureChat or Rehab office at [phone].

## 2022-11-21 DIAGNOSIS — K562 Volvulus: Secondary | ICD-10-CM | POA: Diagnosis not present

## 2022-11-21 LAB — BASIC METABOLIC PANEL
Anion gap: 9 (ref 5–15)
BUN: 15 mg/dL (ref 8–23)
CO2: 22 mmol/L (ref 22–32)
Calcium: 8.7 mg/dL — ABNORMAL LOW (ref 8.9–10.3)
Chloride: 105 mmol/L (ref 98–111)
Creatinine, Ser: 1.21 mg/dL (ref 0.61–1.24)
GFR, Estimated: >60 mL/min (ref >60)
Glucose, Bld: 133 mg/dL — ABNORMAL HIGH (ref 70–99)
Potassium: 2.5 mmol/L — CL (ref 3.5–5.1)
Sodium: 136 mmol/L (ref 135–145)

## 2022-11-21 LAB — URINALYSIS, ROUTINE W REFLEX MICROSCOPIC
Bilirubin Urine: NEGATIVE
Glucose, UA: >=500 mg/dL — AB
Hgb urine dipstick: NEGATIVE
Ketones, ur: NEGATIVE mg/dL
Nitrite: NEGATIVE
Protein, ur: NEGATIVE mg/dL
Specific Gravity, Urine: 1.015 (ref 1.005–1.030)
WBC, UA: >50 WBC/hpf (ref 0–5)
pH: 6 (ref 5.0–8.0)

## 2022-11-21 LAB — NA AND K (SODIUM & POTASSIUM), RAND UR
Potassium Urine: 8 mmol/L
Sodium, Ur: 35 mmol/L

## 2022-11-21 LAB — GLUCOSE, CAPILLARY
Glucose-Capillary: 155 mg/dL — ABNORMAL HIGH (ref 70–99)
Glucose-Capillary: 129 mg/dL — ABNORMAL HIGH (ref 70–99)
Glucose-Capillary: 156 mg/dL — ABNORMAL HIGH (ref 70–99)
Glucose-Capillary: 181 mg/dL — ABNORMAL HIGH (ref 70–99)

## 2022-11-21 LAB — PHOSPHORUS: Phosphorus: 2.5 mg/dL (ref 2.5–4.6)

## 2022-11-21 LAB — POTASSIUM: Potassium: 2.5 mmol/L — CL (ref 3.5–5.1)

## 2022-11-21 LAB — CHLORIDE, URINE, RANDOM: Chloride Urine: 77 mmol/L

## 2022-11-21 LAB — MAGNESIUM: Magnesium: 2 mg/dL (ref 1.7–2.4)

## 2022-11-21 LAB — OSMOLALITY, URINE: Osmolality, Ur: 537 mosm/kg (ref 300–900)

## 2022-11-21 LAB — CREATININE, URINE, RANDOM: Creatinine, Urine: 93 mg/dL

## 2022-11-21 MED ORDER — APIXABAN 5 MG PO TABS
5.0000 mg | ORAL_TABLET | Freq: Two times a day (BID) | ORAL | Status: DC
  Administered 2022-11-21 – 2022-11-22 (×2): 5 mg via ORAL
  Filled 2022-11-21 (×2): qty 1

## 2022-11-21 MED ORDER — POTASSIUM CHLORIDE CRYS ER 20 MEQ PO TBCR
40.0000 meq | EXTENDED_RELEASE_TABLET | ORAL | Status: AC
  Administered 2022-11-21 (×3): 40 meq via ORAL
  Filled 2022-11-21 (×3): qty 2

## 2022-11-21 MED ORDER — POTASSIUM CHLORIDE CRYS ER 20 MEQ PO TBCR
40.0000 meq | EXTENDED_RELEASE_TABLET | ORAL | Status: AC
  Administered 2022-11-21 – 2022-11-22 (×3): 40 meq via ORAL
  Filled 2022-11-21 (×3): qty 2

## 2022-11-21 MED ORDER — POTASSIUM CHLORIDE 10 MEQ/100ML IV SOLN
10.0000 meq | INTRAVENOUS | Status: AC
  Administered 2022-11-21 (×3): 10 meq via INTRAVENOUS
  Filled 2022-11-21 (×3): qty 100

## 2022-11-21 MED ORDER — POTASSIUM CHLORIDE 10 MEQ/100ML IV SOLN
10.0000 meq | Freq: Once | INTRAVENOUS | Status: AC
  Administered 2022-11-21: 10 meq via INTRAVENOUS

## 2022-11-21 MED ORDER — K PHOS MONO-SOD PHOS DI & MONO 155-852-130 MG PO TABS
500.0000 mg | ORAL_TABLET | Freq: Four times a day (QID) | ORAL | Status: AC
  Administered 2022-11-21 (×3): 500 mg via ORAL
  Filled 2022-11-21 (×3): qty 2

## 2022-11-21 MED ORDER — POTASSIUM CHLORIDE 10 MEQ/100ML IV SOLN
10.0000 meq | INTRAVENOUS | Status: AC
  Administered 2022-11-21 (×2): 10 meq via INTRAVENOUS
  Filled 2022-11-21 (×3): qty 100

## 2022-11-21 NOTE — Progress Notes (Signed)
MD made aware of patient's potassium level via secure chat.

## 2022-11-21 NOTE — Discharge Instructions (Addendum)
Recommend soft diet x 2-4 days, then resume baseline diet.

## 2022-11-21 NOTE — Progress Notes (Signed)
   Progress Note  2 Days Post-Op  Interval: Pt doing well.  Pain resolved.  Passing gas and has had multiple stools.  Tolerated eggs this AM.    Objective: Vital signs in last 24 hours: Temp:  [97.8 F (36.6 C)-98.8 F (37.1 C)] 98.8 F (37.1 C) (11/09 0753) Pulse Rate:  [79-90] 79 (11/09 0753) Resp:  [16-19] 16 (11/09 0753) BP: (109-115)/(80-88) 115/86 (11/09 0753) SpO2:  [94 %-99 %] 99 % (11/09 0753) Weight:  [81.5 kg] 81.5 kg (11/09 0600) Last BM Date : 11/20/22  Intake/Output from previous day: 11/08 0701 - 11/09 0700 In: 680.3 [P.O.:680; IV Piggyback:0.3] Out: -  Intake/Output this shift: No intake/output data recorded.  PE: Gen: No acute distress CV: Rate controlled Afib Pulm: Normal work of breathing Abd: Soft, nontender, mild to moderately distended   Lab Results:  I have personally reviewed all labs for past 24h.  Studies/Results: I have personally reviewed and interpreted all imaging for past 24h. I agree with radiologist's interpretation.  Assessment/Plan 67 yo M with complex medical history who presented with sigmoid volvulus s/p decompression/detorsion with GI on 11/7. Sigmoid volvulus appears resolved.  - soft diet for 2-4 more days.  Will sign off. I have included info on discharge info. Call us for recurrent concerns   - Recommend aggressive electrolyte repletion. K > 4, Mg > 2, Phos > 3 to maximize normal bowel motility.   Discharge per [facility]ad with medical concerns are stable.    LOS: 2 days   I reviewed Consultant GI notes, hospitalist notes, last 24 h vitals and pain scores, last 48 h intake and output, last 24 h labs and trends, and last 24 h imaging results.  This care required moderate level of medical decision making.    Almond Lint, MD [facility] 11/21/2022, 10:15 AM

## 2022-11-21 NOTE — Progress Notes (Signed)
PROGRESS NOTE    BODYN ROBUCK  ZOX:[id number] DOB: 28-Jun-1955 DOA: 11/18/2022 PCP: Venita Sheffield, MD    Brief Narrative:   [name] is a 67 y.o. male with past medical history significant for HTN, HLD, DM2, chronic diastolic congestive heart failure, paroxysmal atrial fibrillation, CAD, history of CVA/ICH with residual deficit, CKD stage IIIb, GERD, OA, gout, history of appendectomy and PEG placement with takedown who presented to [facility] ED after being seen at [facility] care for evaluation of acute abdominal pain.  Patient reports distention and constipation over the previous 6 days.  Concern for small bowel obstruction and sent to the ED for further evaluation.  In the ED, temperature 98.1 F, HR 73, RR 11, BP 111/89, SpO2 98% room air.  WBC 6.8, hemoglobin 15.4, platelet count 263.  Sodium 133, potassium 3.0, chloride 101, CO2 21, glucose 119, BUN 26, creat 1.59.  Total bilirubin 0.9.  AST 24, ALT 31, lipase 26.  Lactic acid 1.2.  CT abdomen/pelvis with sigmoid volvulus with markedly distended colon.  EDP consulted GI and general surgery.  Potassium repleted.  TRH consulted for admission for further evaluation management of sigmoid volvulus.  Assessment & Plan:    Sigmoid volvulus Patient presenting with progressive abdominal pain, distention and constipation over the previous 6 days.  Patient was afebrile without leukocytosis.  Lactic acid within normal limits.  CT abdomen/pelvis with notable sigmoid volvulus with markedly distended colon.  GI and general surgery were consulted.  Patient underwent flexible sigmoidoscopy with successful suction decompression of volvulus by Dr. Meridee Score on 11/7.  Given redundancy of sigmoid colon, patient deemed high risk of recurrence of volvulus and recommended general surgery for sigmoid plexy versus sigmoidectomy.  Now tolerating advance diet.  General surgery deferring further intervention such as partial colectomy given the redundancy of  sigmoid colon to outpatient setting. -- Soft diet -- Strict I's and O's, monitor bowel movements  Hypokalemia Potassium 2.5, will replete. -- Follow electrolytes daily and replete as needed  Essential hypertension Chronic diastolic congestive heart failure Home regimen includes carvedilol 25 m p.o. twice daily, Cardizem 120 mg p.o. twice daily, Jardiance 10 mg p.o. daily, Imdur 30 mg p.o. daily, Aldactone 25 mg p.o. daily.  TTE 08/31/2012 with LVEF 55 to 60%. -- Continue to hold antihypertensives given borderline low blood pressure  Type 2 diabetes mellitus Hemoglobin A1c 6.21 July 2022, well-controlled.  Home medications include Jardiance, metformin. -- Hold oral hypoglycemics while inpatient -- SSI for coverage -- CBGs qAC/HS  Paroxysmal atrial fibrillation Home regimen includes carvedilol 25 mg p.o. twice daily, Cardizem 120 mg p.o. twice daily. -- Continue to hold home carvedilol/Cardizem given borderline blood pressures -- Plan to resume slowly once BP improves -- Restart Eliquis today  Hyperlipidemia -- Pravastatin 40 mg p.o. daily  CAD -- Continue statin  History of CVA/ICH with residual deficit -- Continue statin  CKD stage IIIb Creatinine 1.21, stable. -- BMP daily  GERD -- Continue PPI  Osteoarthritis/gout -- Tylenol as needed  DVT prophylaxis: Eliquis    Code Status: Full Code Family Communication: No family present at bedside this morning  Disposition Plan:  Level of care: Med-Surg Status is: Inpatient Remains inpatient appropriate because: Electrolyte replacement, anticipate likely discharge home tomorrow    Consultants:  [place] [facility] - signed off 11/8 General Surgery - signed off 11/9  Procedures:  Flex sigmoidoscopy, Dr. Meridee Score 11/7  Antimicrobials:  None   Subjective: Patient seen examined bedside, resting calmly.  Lying in bed.  RN  present.  Potassium continues to be low at 2.5.  Tolerating advance diet with resolution  of abdominal pain.  Seen by general surgery, now signing off and appears to be deferring any surgical management of redundant sigmoid colon to the outpatient setting. No other specific complaints or concerns at this time.  Denies headache, no dizziness, no chest pain, no palpitations, no shortness of breath, no abdominal pain, no fever/chills/night sweats, no nausea/vomiting/diarrhea, no focal weakness, no fatigue, no paresthesia.  No acute events overnight per nursing.  Objective: Vitals:   11/20/22 2009 11/21/22 0201 11/21/22 0600 11/21/22 0753  BP: 109/86 113/88  115/86  Pulse: 81 90  79  Resp: 16 19  16   Temp: 97.9 F (36.6 C) 97.8 F (36.6 C)  98.8 F (37.1 C)  TempSrc: Oral Oral  Oral  SpO2: 94% 98%  99%  Weight:   81.5 kg   Height:        Intake/Output Summary (Last 24 hours) at 11/21/2022 1219 Last data filed at 11/21/2022 0100 Gross per 24 hour  Intake 680.28 ml  Output --  Net 680.28 ml   Filed Weights   11/18/22 1658 11/21/22 0600  Weight: 87.1 kg 81.5 kg    Examination:  Physical Exam: GEN: NAD, alert and oriented x 3, wd/wn HEENT: NCAT, PERRL, EOMI, sclera clear, MMM PULM: CTAB w/o wheezes/crackles, normal respiratory effort, on room air CV: RRR w/o M/G/R GI: abd soft, mild distention, + BS, no rebound/guarding/masses MSK: no peripheral edema, muscle strength globally intact 5/5 bilateral upper/lower extremities NEURO: CN II-XII intact, no focal deficits, sensation to light touch intact PSYCH: normal mood/affect Integumentary: dry/intact, no rashes or wounds    Data Reviewed: I have personally reviewed following labs and imaging studies  CBC: Recent Labs  Lab 11/18/22 1718 11/19/22 0007 11/20/22 0637  WBC 6.8 7.3 5.8  NEUTROABS  --  4.3  --   HGB 15.4 15.3 16.0  HCT 47.0 46.5 47.1  MCV 90.2 90.3 89.0  PLT 263 239 230   Basic Metabolic Panel: Recent Labs  Lab 11/18/22 1718 11/19/22 0007 11/19/22 0223 11/20/22 0637 11/20/22 1000  11/20/22 1736 11/21/22 0609  NA 133*  --   --  134*  --   --  136  K 3.0* 3.1* 3.3* 2.3*  --  2.8* 2.5*  CL 101  --   --  104  --   --  105  CO2 21*  --   --  21*  --   --  22  GLUCOSE 119*  --   --  107*  --   --  133*  BUN 26*  --   --  22  --   --  15  CREATININE 1.59*  --   --  1.42*  --   --  1.21  CALCIUM 9.8  --   --  8.9  --   --  8.7*  MG  --  2.1  --  1.9  --  2.5* 2.0  PHOS  --   --   --   --  3.0  --  2.5   GFR: Estimated Creatinine Clearance: 62.2 mL/min (by C-G formula based on SCr of 1.21 mg/dL). Liver Function Tests: Recent Labs  Lab 11/18/22 1718  AST 24  ALT 31  ALKPHOS 55  BILITOT 0.9  PROT 7.3  ALBUMIN 3.9   Recent Labs  Lab 11/18/22 1718  LIPASE 26   No results for input(s): "AMMONIA" in the last 168 hours.  Coagulation Profile: No results for input(s): "INR", "PROTIME" in the last 168 hours. Cardiac Enzymes: No results for input(s): "CKTOTAL", "CKMB", "CKMBINDEX", "TROPONINI" in the last 168 hours. BNP (last 3 results) No results for input(s): "PROBNP" in the last 8760 hours. HbA1C: No results for input(s): "HGBA1C" in the last 72 hours. CBG: Recent Labs  Lab 11/20/22 1202 11/20/22 1602 11/20/22 2109 11/21/22 0758 11/21/22 1207  GLUCAP 95 159* 175* 155* 129*   Lipid Profile: No results for input(s): "CHOL", "HDL", "LDLCALC", "TRIG", "CHOLHDL", "LDLDIRECT" in the last 72 hours. Thyroid Function Tests: No results for input(s): "TSH", "T4TOTAL", "FREET4", "T3FREE", "THYROIDAB" in the last 72 hours. Anemia Panel: No results for input(s): "VITAMINB12", "FOLATE", "FERRITIN", "TIBC", "IRON", "RETICCTPCT" in the last 72 hours. Sepsis Labs: Recent Labs  Lab 11/19/22 0007 11/19/22 0223 11/20/22 0807  LATICACIDVEN 1.2 1.0 1.3    No results found for this or any previous visit (from the past 240 hour(s)).       Radiology Studies: No results found.      Scheduled Meds:  enoxaparin (LOVENOX) injection  90 mg Subcutaneous BID    insulin aspart  0-5 Units Subcutaneous QHS   insulin aspart  0-9 Units Subcutaneous TID WC   pantoprazole  40 mg Oral Daily   phosphorus  500 mg Oral QID   potassium chloride  40 mEq Oral Q3H   pravastatin  40 mg Oral QHS   Continuous Infusions:  potassium chloride 10 mEq (11/21/22 1123)     LOS: 2 days    Time spent: 52 minutes spent on chart review, discussion with nursing staff, consultants, updating family and interview/physical exam; more than 50% of that time was spent in counseling and/or coordination of care.    Alvira Philips [place], DO [facility] Available via Epic secure chat 7am-7pm After these hours, please refer to coverage provider listed on [email or web address] 11/21/2022, 12:19 PM

## 2022-11-22 DIAGNOSIS — K562 Volvulus: Secondary | ICD-10-CM | POA: Diagnosis not present

## 2022-11-22 LAB — BASIC METABOLIC PANEL
Anion gap: 5 (ref 5–15)
BUN: 14 mg/dL (ref 8–23)
CO2: 21 mmol/L — ABNORMAL LOW (ref 22–32)
Calcium: 9 mg/dL (ref 8.9–10.3)
Chloride: 112 mmol/L — ABNORMAL HIGH (ref 98–111)
Creatinine, Ser: 1 mg/dL (ref 0.61–1.24)
GFR, Estimated: >60 mL/min (ref >60)
Glucose, Bld: 161 mg/dL — ABNORMAL HIGH (ref 70–99)
Potassium: 3.7 mmol/L (ref 3.5–5.1)
Sodium: 138 mmol/L (ref 135–145)

## 2022-11-22 LAB — GLUCOSE, CAPILLARY
Glucose-Capillary: 193 mg/dL — ABNORMAL HIGH (ref 70–99)
Glucose-Capillary: 146 mg/dL — ABNORMAL HIGH (ref 70–99)

## 2022-11-22 LAB — PHOSPHORUS: Phosphorus: 2.4 mg/dL — ABNORMAL LOW (ref 2.5–4.6)

## 2022-11-22 LAB — MAGNESIUM: Magnesium: 1.8 mg/dL (ref 1.7–2.4)

## 2022-11-22 MED ORDER — MAGNESIUM SULFATE 2 GM/50ML IV SOLN
2.0000 g | Freq: Once | INTRAVENOUS | Status: AC
  Administered 2022-11-22: 2 g via INTRAVENOUS
  Filled 2022-11-22: qty 50

## 2022-11-22 MED ORDER — POTASSIUM PHOSPHATES 15 MMOLE/5ML IV SOLN
15.0000 mmol | Freq: Once | INTRAVENOUS | Status: AC
  Administered 2022-11-22: 15 mmol via INTRAVENOUS
  Filled 2022-11-22: qty 5

## 2022-11-22 MED ORDER — POTASSIUM CHLORIDE CRYS ER 20 MEQ PO TBCR
30.0000 meq | EXTENDED_RELEASE_TABLET | Freq: Once | ORAL | Status: AC
  Administered 2022-11-22: 30 meq via ORAL
  Filled 2022-11-22: qty 1

## 2022-11-22 NOTE — Progress Notes (Signed)
PROGRESS NOTE    [name]  UYQ:[id number] DOB: 04/03/1955 DOA: 11/18/2022 PCP: Venita Sheffield, MD    Brief Narrative:   [name] is a 67 y.o. male with past medical history significant for HTN, HLD, DM2, chronic diastolic congestive heart failure, paroxysmal atrial fibrillation, CAD, history of CVA/ICH with residual deficit, CKD stage IIIb, GERD, OA, gout, history of appendectomy and PEG placement with takedown who presented to [facility] ED after being seen at [facility] care for evaluation of acute abdominal pain.  Patient reports distention and constipation over the previous 6 days.  Concern for small bowel obstruction and sent to the ED for further evaluation.  In the ED, temperature 98.1 F, HR 73, RR 11, BP 111/89, SpO2 98% room air.  WBC 6.8, hemoglobin 15.4, platelet count 263.  Sodium 133, potassium 3.0, chloride 101, CO2 21, glucose 119, BUN 26, creat 1.59.  Total bilirubin 0.9.  AST 24, ALT 31, lipase 26.  Lactic acid 1.2.  CT abdomen/pelvis with sigmoid volvulus with markedly distended colon.  EDP consulted GI and general surgery.  Potassium repleted.  TRH consulted for admission for further evaluation management of sigmoid volvulus.  Assessment & Plan:    Sigmoid volvulus Patient presenting with progressive abdominal pain, distention and constipation over the previous 6 days.  Patient was afebrile without leukocytosis.  Lactic acid within normal limits.  CT abdomen/pelvis with notable sigmoid volvulus with markedly distended colon.  GI and general surgery were consulted.  Patient underwent flexible sigmoidoscopy with successful suction decompression of volvulus by Dr. Meridee Score on 11/7.  Given redundancy of sigmoid colon, patient deemed high risk of recurrence of volvulus and recommended general surgery for sigmoid plexy versus sigmoidectomy.  Now tolerating advance diet.  General surgery deferring further intervention such as partial colectomy given the redundancy of  sigmoid colon to outpatient setting. -- Soft diet -- Strict I's and O's, monitor bowel movements  Hypokalemia Potassium 3.7, will replete. -- Follow electrolytes daily and replete as needed  Hypophosphatemia Phosphorus 2.4, will replete.  Hypomagnesemia Magnesium 1.8, will replete today.  Essential hypertension Chronic diastolic congestive heart failure Home regimen includes carvedilol 25 m p.o. twice daily, Cardizem 120 mg p.o. twice daily, Jardiance 10 mg p.o. daily, Imdur 30 mg p.o. daily, Aldactone 25 mg p.o. daily.  TTE 08/31/2012 with LVEF 55 to 60%. -- Continue to hold antihypertensives given borderline low blood pressure  Type 2 diabetes mellitus Hemoglobin A1c 6.21 July 2022, well-controlled.  Home medications include Jardiance, metformin. -- Hold oral hypoglycemics while inpatient -- SSI for coverage -- CBGs qAC/HS  Paroxysmal atrial fibrillation Home regimen includes carvedilol 25 mg p.o. twice daily, Cardizem 120 mg p.o. twice daily. -- Continue to hold home carvedilol/Cardizem given borderline blood pressures -- Plan to resume slowly once BP improves -- Eliquis  Hyperlipidemia -- Pravastatin 40 mg p.o. daily  CAD -- Continue statin  History of CVA/ICH with residual deficit -- Continue statin  CKD stage IIIb Creatinine 1.21, stable. -- BMP daily  GERD -- Continue PPI  Osteoarthritis/gout -- Tylenol as needed  DVT prophylaxis: Eliquis apixaban (ELIQUIS) tablet 5 mg    Code Status: Full Code Family Communication: No family present at bedside this morning  Disposition Plan:  Level of care: Med-Surg Status is: Inpatient Remains inpatient appropriate because: Electrolyte replacement, anticipate likely discharge home later today following electrolyte replacement    Consultants:  [place] [facility] - signed off 11/8 General Surgery - signed off 11/9  Procedures:  Flex sigmoidoscopy, Dr. Meridee Score  11/7  Antimicrobials:   None   Subjective: Patient seen examined bedside, resting calmly.  Lying in bed.  RN present.  Electrolytes markedly improved, will continue for replacement this morning. Seen by general surgery, yesterday and now signed off; appears to be deferring any surgical management of redundant sigmoid colon to the outpatient setting. No other specific complaints or concerns at this time.  Denies headache, no dizziness, no chest pain, no palpitations, no shortness of breath, no abdominal pain, no fever/chills/night sweats, no nausea/vomiting/diarrhea, no focal weakness, no fatigue, no paresthesia.  No acute events overnight per nursing.  Objective: Vitals:   11/21/22 1433 11/21/22 1951 11/22/22 0448 11/22/22 0737  BP: (!) 131/96 (!) 119/98 (!) 131/99 (!) 135/95  Pulse: 80 88 82 74  Resp: 16 17 20 18   Temp: 98 F (36.7 C) 98.1 F (36.7 C)    TempSrc: Oral Oral    SpO2: 98% 99% 100% 98%  Weight:      Height:        Intake/Output Summary (Last 24 hours) at 11/22/2022 1033 Last data filed at 11/22/2022 5784 Gross per 24 hour  Intake 960 ml  Output 2300 ml  Net -1340 ml   Filed Weights   11/18/22 1658 11/21/22 0600  Weight: 87.1 kg 81.5 kg    Examination:  Physical Exam: GEN: NAD, alert and oriented x 3, wd/wn HEENT: NCAT, PERRL, EOMI, sclera clear, MMM PULM: CTAB w/o wheezes/crackles, normal respiratory effort, on room air CV: RRR w/o M/G/R GI: abd soft, mild distention, + BS, no rebound/guarding/masses MSK: no peripheral edema, muscle strength globally intact 5/5 bilateral upper/lower extremities NEURO: CN II-XII intact, no focal deficits, sensation to light touch intact PSYCH: normal mood/affect Integumentary: dry/intact, no rashes or wounds    Data Reviewed: I have personally reviewed following labs and imaging studies  CBC: Recent Labs  Lab 11/18/22 1718 11/19/22 0007 11/20/22 0637  WBC 6.8 7.3 5.8  NEUTROABS  --  4.3  --   HGB 15.4 15.3 16.0  HCT 47.0 46.5 47.1   MCV 90.2 90.3 89.0  PLT 263 239 230   Basic Metabolic Panel: Recent Labs  Lab 11/18/22 1718 11/19/22 0007 11/19/22 0223 11/20/22 0637 11/20/22 1000 11/20/22 1736 11/21/22 0609 11/21/22 1601 11/22/22 0606  NA 133*  --   --  134*  --   --  136  --  138  K 3.0* 3.1*   < > 2.3*  --  2.8* 2.5* 2.5* 3.7  CL 101  --   --  104  --   --  105  --  112*  CO2 21*  --   --  21*  --   --  22  --  21*  GLUCOSE 119*  --   --  107*  --   --  133*  --  161*  BUN 26*  --   --  22  --   --  15  --  14  CREATININE 1.59*  --   --  1.42*  --   --  1.21  --  1.00  CALCIUM 9.8  --   --  8.9  --   --  8.7*  --  9.0  MG  --  2.1  --  1.9  --  2.5* 2.0  --  1.8  PHOS  --   --   --   --  3.0  --  2.5  --  2.4*   < > = values in this  interval not displayed.   GFR: Estimated Creatinine Clearance: 75.3 mL/min (by C-G formula based on SCr of 1 mg/dL). Liver Function Tests: Recent Labs  Lab 11/18/22 1718  AST 24  ALT 31  ALKPHOS 55  BILITOT 0.9  PROT 7.3  ALBUMIN 3.9   Recent Labs  Lab 11/18/22 1718  LIPASE 26   No results for input(s): "AMMONIA" in the last 168 hours. Coagulation Profile: No results for input(s): "INR", "PROTIME" in the last 168 hours. Cardiac Enzymes: No results for input(s): "CKTOTAL", "CKMB", "CKMBINDEX", "TROPONINI" in the last 168 hours. BNP (last 3 results) No results for input(s): "PROBNP" in the last 8760 hours. HbA1C: No results for input(s): "HGBA1C" in the last 72 hours. CBG: Recent Labs  Lab 11/21/22 0758 11/21/22 1207 11/21/22 1658 11/21/22 2201 11/22/22 0739  GLUCAP 155* 129* 156* 181* 193*   Lipid Profile: No results for input(s): "CHOL", "HDL", "LDLCALC", "TRIG", "CHOLHDL", "LDLDIRECT" in the last 72 hours. Thyroid Function Tests: No results for input(s): "TSH", "T4TOTAL", "FREET4", "T3FREE", "THYROIDAB" in the last 72 hours. Anemia Panel: No results for input(s): "VITAMINB12", "FOLATE", "FERRITIN", "TIBC", "IRON", "RETICCTPCT" in the last 72  hours. Sepsis Labs: Recent Labs  Lab 11/19/22 0007 11/19/22 0223 11/20/22 0807  LATICACIDVEN 1.2 1.0 1.3    No results found for this or any previous visit (from the past 240 hour(s)).       Radiology Studies: No results found.      Scheduled Meds:  apixaban  5 mg Oral BID   insulin aspart  0-5 Units Subcutaneous QHS   insulin aspart  0-9 Units Subcutaneous TID WC   pantoprazole  40 mg Oral Daily   pravastatin  40 mg Oral QHS   Continuous Infusions:  potassium PHOSPHATE IVPB (in mmol) 15 mmol (11/22/22 1002)     LOS: 3 days    Time spent: 52 minutes spent on chart review, discussion with nursing staff, consultants, updating family and interview/physical exam; more than 50% of that time was spent in counseling and/or coordination of care.    Alvira Philips [place], DO [facility] Available via Epic secure chat 7am-7pm After these hours, please refer to coverage provider listed on [email or web address] 11/22/2022, 10:33 AM

## 2022-11-22 NOTE — Discharge Summary (Signed)
Physician Discharge Summary  [name] NFA:[id number] DOB: Dec 03, 1955 DOA: 11/18/2022  PCP: Venita Sheffield, MD  Admit date: 11/18/2022 Discharge date: 11/22/2022  Admitted From: Home Disposition: Home  Recommendations for Outpatient Follow-up:  Follow up with PCP in 1-2 weeks Recommend outpatient follow-up with general surgery for consideration of partial colectomy given redundancy of sigmoid colon as patient is high risk for recurrent sigmoid volvulus Please obtain BMP, magnesium, phosphorus level in one week to ensure electrolytes remained stable  Home Health: no Equipment/Devices: none   Discharge Condition: Stable CODE STATUS: Full code Diet recommendation: Soft diet for 2 days following hospitalization followed by resumption of heart healthy/consistent carbohydrate diet  History of present illness:  [name] is a 67 y.o. male with past medical history significant for HTN, HLD, DM2, chronic diastolic congestive heart failure, paroxysmal atrial fibrillation, CAD, history of CVA/ICH with residual deficit, CKD stage IIIb, GERD, OA, gout, history of appendectomy and PEG placement with takedown who presented to [facility] ED after being seen at [facility] care for evaluation of acute abdominal pain.  Patient reports distention and constipation over the previous 6 days.  Concern for small bowel obstruction and sent to the ED for further evaluation.   In the ED, temperature 98.1 F, HR 73, RR 11, BP 111/89, SpO2 98% room air.  WBC 6.8, hemoglobin 15.4, platelet count 263.  Sodium 133, potassium 3.0, chloride 101, CO2 21, glucose 119, BUN 26, creat 1.59.  Total bilirubin 0.9.  AST 24, ALT 31, lipase 26.  Lactic acid 1.2.  CT abdomen/pelvis with sigmoid volvulus with markedly distended colon.  EDP consulted GI and general surgery.  Potassium repleted.  TRH consulted for admission for further evaluation management of sigmoid volvulus.  Hospital course:  Sigmoid  volvulus Patient presenting with progressive abdominal pain, distention and constipation over the previous 6 days.  Patient was afebrile without leukocytosis.  Lactic acid within normal limits.  CT abdomen/pelvis with notable sigmoid volvulus with markedly distended colon.  GI and general surgery were consulted.  Patient underwent flexible sigmoidoscopy with successful suction decompression of volvulus by Dr. Meridee Score on 11/7.  Given redundancy of sigmoid colon, patient deemed high risk of recurrence of volvulus and recommended general surgery for sigmoid plexy versus sigmoidectomy.  Now tolerating advance diet.  General surgery deferring further intervention such as partial colectomy given the redundancy of sigmoid colon to outpatient setting; recommend close outpatient follow-up given risk of recurrence.   Hypokalemia Repleted during hospitalization.  Recommend repeat BMP 1 week.   Hypophosphatemia Repleted during hospitalization, recommend repeat potassium level 1 week.   Hypomagnesemia Repleted during hospitalization, recommend repeat magnesium level 1 week.   Essential hypertension Chronic diastolic congestive heart failure Home regimen includes carvedilol 25 m p.o. twice daily, Cardizem 120 mg p.o. twice daily, Jardiance 10 mg p.o. daily, Imdur 30 mg p.o. daily, Aldactone 25 mg p.o. daily.  TTE 08/31/2012 with LVEF 55 to 60%.   Type 2 diabetes mellitus Hemoglobin A1c 6.21 July 2022, well-controlled.  Home medications include Jardiance, metformin.   Paroxysmal atrial fibrillation Home regimen includes carvedilol 25 mg p.o. twice daily, Cardizem 120 mg p.o. twice daily, Eliquis.   Hyperlipidemia Pravastatin 40 mg p.o. daily   CAD Continue statin   History of CVA/ICH with residual deficit Continue statin   CKD stage IIIb Creatinine 1.00 at time of discharge.  Stable.   GERD Continue PPI   Osteoarthritis/gout Tylenol as needed  Discharge Diagnoses:  Principal Problem:    Volvulus of colon (  [facility])    Discharge Instructions  Discharge Instructions     Call MD for:  difficulty breathing, headache or visual disturbances   Complete by: As directed    Call MD for:  extreme fatigue   Complete by: As directed    Call MD for:  persistant dizziness or light-headedness   Complete by: As directed    Call MD for:  persistant nausea and vomiting   Complete by: As directed    Call MD for:  severe uncontrolled pain   Complete by: As directed    Call MD for:  temperature >100.4   Complete by: As directed    Diet - low sodium heart healthy   Complete by: As directed    Increase activity slowly   Complete by: As directed       Allergies as of 11/22/2022   No Known Allergies      Medication List     STOP taking these medications    ciprofloxacin 500 MG tablet Commonly known as: Cipro   finasteride 5 MG tablet Commonly known as: PROSCAR   phenazopyridine 100 MG tablet Commonly known as: Pyridium   sitaGLIPtin 50 MG tablet Commonly known as: JANUVIA   solifenacin 5 MG tablet Commonly known as: VESIcare   tamsulosin 0.4 MG Caps capsule Commonly known as: FLOMAX       TAKE these medications    acetaminophen 500 MG tablet Commonly known as: TYLENOL Take 1,000 mg by mouth daily as needed for moderate pain, fever or headache.   Alcohol Prep 70 % Pads Use to test blood sugar daily. Dx: E11.21   apixaban 5 MG Tabs tablet Commonly known as: Eliquis Take 1 tablet (5 mg total) by mouth 2 (two) times daily.   blood glucose meter kit and supplies Dispense based on patient and insurance preference. Use up to four times daily as directed. (FOR ICD-10 E10.9, E11.9).   carvedilol 25 MG tablet Commonly known as: COREG Take 1 tablet (25 mg total) by mouth 2 (two) times daily with a meal. Needs an appointment before anymore future refills.   diltiazem 180 MG 24 hr capsule Commonly known as: CARDIZEM CD TAKE 2 CAPSULES BY MOUTH  DAILY AT  NOON What changed:  how much to take how to take this when to take this additional instructions   empagliflozin 10 MG Tabs tablet Commonly known as: JARDIANCE Take 1 tablet (10 mg total) by mouth daily.   isosorbide mononitrate 30 MG 24 hr tablet Commonly known as: IMDUR Take 1 tablet (30 mg total) by mouth daily. What changed: when to take this   MENS ONE DAILY PO Take 1 tablet by mouth daily.   metFORMIN 850 MG tablet Commonly known as: GLUCOPHAGE Take 1 tablet (850 mg total) by mouth daily with breakfast.   OneTouch Delica Lancets 33G Misc Use as directed to test blood sugar once daily   OneTouch Ultra test strip Generic drug: glucose blood 1 each by Other route daily. Dx: E11.21   pantoprazole 40 MG tablet Commonly known as: PROTONIX Take 1 tablet (40 mg total) by mouth daily. What changed: when to take this   pravastatin 40 MG tablet Commonly known as: PRAVACHOL Take 1 tablet (40 mg total) by mouth at bedtime.   spironolactone 25 MG tablet Commonly known as: ALDACTONE Take 1 tablet (25 mg total) by mouth daily.        Follow-up Information     Venita Sheffield, MD. Schedule an appointment as soon as  possible for a visit in 1 week(s).   Specialty: Internal Medicine Contact information: [address] [place] [address] [phone]         [facility], [place]. Schedule an appointment as soon as possible for a visit in 1 week(s).   Specialty: General Surgery Why: Sigmoid Volvulus Contact information: [address] [place] [address] [phone]               No Known Allergies  Consultations: [facility]   Procedures/Studies: DG Abd 2 Views  Result Date: 11/19/2022 CLINICAL DATA:  6056 Volvulus (HCC) 6056. EXAM: ABDOMEN - 2 VIEW COMPARISON:  CT scan abdomen and pelvis from 11/18/2022. FINDINGS: There is interval placement of rectal tube. There  is significant interval decrease in the degree of colonic distention when compared to the prior CT scan. No pneumatosis or portal venous gas. No evidence of pneumoperitoneum. No acute osseous abnormalities. The soft tissues are within normal limits. Surgical changes, devices, tubes and lines: None. IMPRESSION: *Significant interval decrease in the degree of colonic distention when compared to the prior CT scan, secondary to rectal tube placement. Electronically Signed   By: Jules Schick M.D.   On: 11/19/2022 12:02   CT ABDOMEN PELVIS W CONTRAST  Result Date: 11/18/2022 CLINICAL DATA:  Abdominal pain EXAM: CT ABDOMEN AND PELVIS WITH CONTRAST TECHNIQUE: Multidetector CT imaging of the abdomen and pelvis was performed using the standard protocol following bolus administration of intravenous contrast. RADIATION DOSE REDUCTION: This exam was performed according to the departmental dose-optimization program which includes automated exposure control, adjustment of the mA and/or kV according to patient size and/or use of iterative reconstruction technique. CONTRAST:  60mL OMNIPAQUE IOHEXOL 350 MG/ML SOLN COMPARISON:  04/13/2022 FINDINGS: Lower chest: Coronary artery and aortic calcifications. No acute abnormality. Hepatobiliary: No focal hepatic abnormality. Gallbladder unremarkable. Pancreas: No focal abnormality or ductal dilatation. Spleen: No focal abnormality.  Normal size. Adrenals/Urinary Tract: Adrenal glands normal. Benign-appearing cyst in the upper pole left kidney. No follow-up imaging recommended. No stones or hydronephrosis. Urinary bladder unremarkable. Stomach/Bowel: Markedly distended colon measuring up to 10 cm in the cecum and 9 cm in the transverse colon. Stomach and small bowel are decompressed. Swirled appearance of the mesentery leading to the sigmoid colon compatible with sigmoid volvulus. Vascular/Lymphatic: No evidence of aneurysm or adenopathy. Aortic atherosclerosis. Reproductive: No  visible focal abnormality. Other: No free fluid or free air. Musculoskeletal: No acute bony abnormality. IMPRESSION: Sigmoid volvulus with markedly distended colon. Aortic atherosclerosis. These results were called by telephone at the time of interpretation on 11/18/2022 at 11:34 pm to provider Dr. Delena Serve, Who verbally acknowledged these results. Electronically Signed   By: Charlett Nose M.D.   On: 11/18/2022 23:38     Subjective: Patient seen examined bedside, resting calmly.  Lying in bed.  Electrolytes markedly improved.  Tolerating diet.  Ready for discharge home.  Discussed will need close follow-up with general surgery regarding sigmoid volvulus given high incidence of recurrence given redundant sigmoid colon.  No other specific questions, concerns or complaints at this time.  Denies headache, no visual changes, no chest pain, no palpitations, no shortness of breath, no abdominal pain, no fever/chills/night sweats, no nausea/vomiting/diarrhea, no focal weakness, no fatigue, no paresthesias.  No acute events overnight per nursing staff.  Discharge Exam: Vitals:   11/22/22 0448 11/22/22 0737  BP: (!) 131/99 (!) 135/95  Pulse: 82 74  Resp: 20 18  Temp:    SpO2:  100% 98%   Vitals:   11/21/22 1433 11/21/22 1951 11/22/22 0448 11/22/22 0737  BP: (!) 131/96 (!) 119/98 (!) 131/99 (!) 135/95  Pulse: 80 88 82 74  Resp: 16 17 20 18   Temp: 98 F (36.7 C) 98.1 F (36.7 C)    TempSrc: Oral Oral    SpO2: 98% 99% 100% 98%  Weight:      Height:        Physical Exam: GEN: NAD, alert and oriented x 3, wd/wn HEENT: NCAT, PERRL, EOMI, sclera clear, MMM PULM: CTAB w/o wheezes/crackles, normal respiratory effort CV: RRR w/o M/G/R GI: abd soft, NTND, NABS, no R/G/M MSK: no peripheral edema, muscle strength globally intact 5/5 bilateral upper/lower extremities NEURO: CN II-XII intact, no focal deficits, sensation to light touch intact PSYCH: normal mood/affect Integumentary: dry/intact, no rashes or  wounds    The results of significant diagnostics from this hospitalization (including imaging, microbiology, ancillary and laboratory) are listed below for reference.     Microbiology: No results found for this or any previous visit (from the past 240 hour(s)).   Labs: BNP (last 3 results) No results for input(s): "BNP" in the last 8760 hours. Basic Metabolic Panel: Recent Labs  Lab 11/18/22 1718 11/19/22 0007 11/19/22 0223 11/20/22 0637 11/20/22 1000 11/20/22 1736 11/21/22 0609 11/21/22 1601 11/22/22 0606  NA 133*  --   --  134*  --   --  136  --  138  K 3.0* 3.1*   < > 2.3*  --  2.8* 2.5* 2.5* 3.7  CL 101  --   --  104  --   --  105  --  112*  CO2 21*  --   --  21*  --   --  22  --  21*  GLUCOSE 119*  --   --  107*  --   --  133*  --  161*  BUN 26*  --   --  22  --   --  15  --  14  CREATININE 1.59*  --   --  1.42*  --   --  1.21  --  1.00  CALCIUM 9.8  --   --  8.9  --   --  8.7*  --  9.0  MG  --  2.1  --  1.9  --  2.5* 2.0  --  1.8  PHOS  --   --   --   --  3.0  --  2.5  --  2.4*   < > = values in this interval not displayed.   Liver Function Tests: Recent Labs  Lab 11/18/22 1718  AST 24  ALT 31  ALKPHOS 55  BILITOT 0.9  PROT 7.3  ALBUMIN 3.9   Recent Labs  Lab 11/18/22 1718  LIPASE 26   No results for input(s): "AMMONIA" in the last 168 hours. CBC: Recent Labs  Lab 11/18/22 1718 11/19/22 0007 11/20/22 0637  WBC 6.8 7.3 5.8  NEUTROABS  --  4.3  --   HGB 15.4 15.3 16.0  HCT 47.0 46.5 47.1  MCV 90.2 90.3 89.0  PLT 263 239 230   Cardiac Enzymes: No results for input(s): "CKTOTAL", "CKMB", "CKMBINDEX", "TROPONINI" in the last 168 hours. BNP: Invalid input(s): "POCBNP" CBG: Recent Labs  Lab 11/21/22 1207 11/21/22 1658 11/21/22 2201 11/22/22 0739 11/22/22 1136  GLUCAP 129* 156* 181* 193* 146*   D-Dimer No results for input(s): "DDIMER" in the last 72 hours. Hgb A1c No  results for input(s): "HGBA1C" in the last 72 hours. Lipid  Profile No results for input(s): "CHOL", "HDL", "LDLCALC", "TRIG", "CHOLHDL", "LDLDIRECT" in the last 72 hours. Thyroid function studies No results for input(s): "TSH", "T4TOTAL", "T3FREE", "THYROIDAB" in the last 72 hours.  Invalid input(s): "FREET3" Anemia work up No results for input(s): "VITAMINB12", "FOLATE", "FERRITIN", "TIBC", "IRON", "RETICCTPCT" in the last 72 hours. Urinalysis    Component Value Date/Time   COLORURINE YELLOW 11/21/2022 2108   APPEARANCEUR HAZY (A) 11/21/2022 2108   LABSPEC 1.015 11/21/2022 2108   PHURINE 6.0 11/21/2022 2108   GLUCOSEU >=500 (A) 11/21/2022 2108   HGBUR NEGATIVE 11/21/2022 2108   BILIRUBINUR NEGATIVE 11/21/2022 2108   BILIRUBINUR negative 04/09/2022 1149   KETONESUR NEGATIVE 11/21/2022 2108   PROTEINUR NEGATIVE 11/21/2022 2108   UROBILINOGEN 0.2 04/09/2022 1149   UROBILINOGEN 0.2 09/02/2012 1251   NITRITE NEGATIVE 11/21/2022 2108   LEUKOCYTESUR LARGE (A) 11/21/2022 2108   Sepsis Labs Recent Labs  Lab 11/18/22 1718 11/19/22 0007 11/20/22 0637  WBC 6.8 7.3 5.8   Microbiology No results found for this or any previous visit (from the past 240 hour(s)).   Time coordinating discharge: Over 30 minutes  SIGNED:   Alvira Philips [place], DO  [facility] 11/22/2022, 1:52 PM

## 2022-11-23 ENCOUNTER — Telehealth: Payer: Self-pay

## 2022-11-23 NOTE — Transitions of Care (Post Inpatient/ED Visit) (Signed)
   11/23/2022  Name: [name] MRN: [id number] DOB: 1955-06-10  Today's TOC FU Call Status: Today's TOC FU Call Status:: Unsuccessful Call (1st Attempt) Unsuccessful Call (1st Attempt) Date: 11/23/22  Attempted to reach the patient regarding the most recent Inpatient/ED visit.  Follow Up Plan: Additional outreach attempts will be made to reach the patient to complete the Transitions of Care (Post Inpatient/ED visit) call.     Antionette Fairy, RN,BSN,CCM RN Care Manager Transitions of Care  [place]/Population [facility]  Direct Phone: [phone] Toll Free: [phone] Fax: [phone]

## 2022-11-23 NOTE — Transitions of Care (Post Inpatient/ED Visit) (Signed)
11/23/2022  Name: [name] MRN: [id number] DOB: 1955/09/08  Today's TOC FU Call Status: Today's TOC FU Call Status:: Successful TOC FU Call Completed TOC FU Call Complete Date: 11/23/22 (Incoming call from pt returning RN CM call) Patient's Name and Date of Birth confirmed.  Transition Care Management Follow-up Telephone Call Date of Discharge: 11/22/22 Discharge Facility: [facility]) Type of Discharge: Inpatient Admission Primary Inpatient Discharge Diagnosis:: "sigmoid volvulus" How have you been since you were released from the hospital?: Better (Pt voices he is doing okay-no acute sxs/issues-rested well, appetite good, cbg 125 this AM, normal BM today) Any questions or concerns?: No  Items Reviewed: Did you receive and understand the discharge instructions provided?: Yes Medications obtained,verified, and reconciled?: Yes (Medications Reviewed) Any new allergies since your discharge?: No Dietary orders reviewed?: Yes Type of Diet Ordered:: low salt/heart healthy/carb modified Do you have support at home?: Yes People in Home: alone, sibling(s) Name of Support/Comfort Primary Source: brother lives nearby and assists as needed  Medications Reviewed Today: Medications Reviewed Today     Reviewed by Charlyn Minerva, RN (Registered Nurse) on 11/23/22 at 1440  Med List Status: <None>   Medication Order Taking? Sig Documenting Provider Last Dose Status Informant  acetaminophen (TYLENOL) 500 MG tablet [id number] Yes Take 1,000 mg by mouth daily as needed for moderate pain, fever or headache. [provider] Taking Active Self  Alcohol Swabs (ALCOHOL PREP) 70 % PADS [id number] Yes Use to test blood sugar daily. Dx: E11.21 Venita Sheffield, MD Taking Active Self  apixaban (ELIQUIS) 5 MG TABS tablet [id number] Yes Take 1 tablet (5 mg total) by mouth 2 (two) times daily. Nahser, Deloris Ping, MD Taking Active Self  blood glucose meter kit and supplies  [id number] Yes Dispense based on patient and insurance preference. Use up to four times daily as directed. (FOR ICD-10 E10.9, E11.9). Frederica Kuster, MD Taking Active Self  carvedilol (COREG) 25 MG tablet [id number] Yes Take 1 tablet (25 mg total) by mouth 2 (two) times daily with a meal. Needs an appointment before anymore future refills. Frederica Kuster, MD Taking Active Self  diltiazem (CARDIZEM CD) 180 MG 24 hr capsule [id number] Yes TAKE 2 CAPSULES BY MOUTH  DAILY AT NOON  Patient taking differently: Take 180 mg by mouth See admin instructions. Take 1 capsule by mouth at noon, then 1 capsule at 1600/1700   Swinyer, Zachary George, NP Taking Active Self  empagliflozin (JARDIANCE) 10 MG TABS tablet [id number] Yes Take 1 tablet (10 mg total) by mouth daily. Frederica Kuster, MD Taking Active Self  glucose blood [facility] ULTRA) test strip [id number] Yes 1 each by Other route daily. Dx: E11.21 Frederica Kuster, MD Taking Active Self  isosorbide mononitrate (IMDUR) 30 MG 24 hr tablet [id number] Yes Take 1 tablet (30 mg total) by mouth daily.  Patient taking differently: Take 30 mg by mouth at bedtime.   Frederica Kuster, MD Taking Active Self  metFORMIN (GLUCOPHAGE) 850 MG tablet [id number] Yes Take 1 tablet (850 mg total) by mouth daily with breakfast. Venita Sheffield, MD Taking Active Self  Multiple Vitamins-Minerals (MENS ONE DAILY PO) [id number] Yes Take 1 tablet by mouth daily. [provider] Taking Active Self  Dola Argyle LANCETS 33G [place] [id number] Yes Use as directed to test blood sugar once daily Marcine Matar, MD Taking Active Self  pantoprazole (PROTONIX) 40 MG tablet [id number] Yes Take 1 tablet (40 mg total) by mouth daily.  Patient taking differently: Take  40 mg by mouth daily in the afternoon.   Frederica Kuster, MD Taking Active Self  pravastatin (PRAVACHOL) 40 MG tablet [id number] Yes Take 1 tablet (40 mg total) by mouth at bedtime. Venita Sheffield, MD  Taking Active Self  spironolactone (ALDACTONE) 25 MG tablet [id number] Yes Take 1 tablet (25 mg total) by mouth daily. Ngetich, Donalee Citrin, NP Taking Active Self            Home Care and Equipment/Supplies: Were Home Health Services Ordered?: NA Any new equipment or medical supplies ordered?: NA  Functional Questionnaire: Do you need assistance with bathing/showering or dressing?: No Do you need assistance with meal preparation?: No Do you need assistance with eating?: No Do you have difficulty maintaining continence: No Do you need assistance with getting out of bed/getting out of a chair/moving?: No Do you have difficulty managing or taking your medications?: No  Follow up appointments reviewed: PCP Follow-up appointment confirmed?: Yes Date of PCP follow-up appointment?: 12/01/22 Follow-up Provider: Dr. Jacquenette Shone Specialist [facility] Follow-up appointment confirmed?: No Reason Specialist Follow-Up Not Confirmed: Patient has Specialist Provider Number and will Call for Appointment (pt has [facility] office number and will call office today to make f/u appt) Do you need transportation to your follow-up appointment?: Yes Transportation Need Intervention Addressed By:: AMB Referral For SDOH Needs (pt voices he uses insurance transportation when able to do so but interested in getting set up with other transportation options-espeically interested in SCAT- referral to SW and appt scheduled by care guide) Do you understand care options if your condition(s) worsen?: Yes-patient verbalized understanding  SDOH Interventions Today    Flowsheet Row Most Recent Value  SDOH Interventions   Food Insecurity Interventions Intervention Not Indicated  Transportation Interventions AMB Referral  [appt scheduled with SW for transportation resources]         Goals Addressed             This Visit's Progress    TOC Care Plan       Current Barriers:  Chronic Disease Management support and education  needs related to of chronic conditions  Transportation barriers  RNCM Clinical Goal(s):  Patient will work with the Care Management team over the next 30 days to address Transition of Care Barriers: Transportation take all medications exactly as prescribed and will call provider for medication related questions as evidenced by med adherence attend all scheduled medical appointments:   as evidenced by completion of PCP and surgeon appt work with Child psychotherapist to address  related to the management of Transportation related to the management of transportation barriers as evidenced by review of EMR and patient or social worker report not experience hospital admission as evidenced by review of EMR. Hospital Admissions in last 6 months =    through collaboration with RN Care manager, provider, and care team.   Interventions: Evaluation of current treatment plan related to  self management and patient's adherence to plan as established by provider  Transitions of Care:  New goal. Doctor Visits  - discussed the importance of doctor visits Arranged PCP follow-up within 12-14 days (Care Guide Scheduled) SW referral placed for assistance with transportation resources as he voices trouble getting to medical appts at times-interested in SCAT Assessed for any changes in condition-pt voices he is doing okay-denies any acute issues/concerns Med review completed with pt and assessed for any barriers  Patient Goals/Self-Care Activities: Participate in Transition of Care Program/Attend TOC scheduled calls Take all medications as prescribed Attend all scheduled provider  appointments Call provider office for new concerns or questions  Work with the social worker to address care coordination needs and will continue to work with the clinical team to address health care and disease management related needs  Follow Up Plan:  Telephone follow up appointment with care management team member scheduled for:   12/02/22-11am The patient has been provided with contact information for the care management team and has been advised to call with any health related questions or concerns.          Antionette Fairy, RN,BSN,CCM RN Care Manager Transitions of Care  [place]/Population Health  Direct Phone: [phone] Toll Free: [phone] Fax: [phone]

## 2022-11-24 ENCOUNTER — Ambulatory Visit: Payer: Self-pay

## 2022-11-24 NOTE — Patient Outreach (Signed)
  Care Coordination   Initial Visit Note   11/24/2022 Name: [name] MRN: [id number] DOB: [birth year]  [name] is a 67 y.o. year old male who sees Venita Sheffield, MD for primary care. I spoke with  Corey Skains by phone today.  What matters to the patients health and wellness today?  Patient wants transportation options.    Goals Addressed             This Visit's Progress    Transportation       Interventions Today    Flowsheet Row Most Recent Value  Chronic Disease   Chronic disease during today's visit Diabetes, Hypertension (HTN), Atrial Fibrillation (AFib)  General Interventions   General Interventions Discussed/Reviewed General Interventions Discussed, General Interventions Reviewed  [Pt has transportation with [employer] but wants more options. SCAT application is completed. SW will submit to Provider for review. Pt reports mobility issues due to stroke.  Pt has not exhaused current rides with insurance.]              SDOH assessments and interventions completed:  Yes  SDOH Interventions Today    Flowsheet Row Most Recent Value  SDOH Interventions   Food Insecurity Interventions Intervention Not Indicated, Other (Comment)  [Receives Foodstamps]  Housing Interventions Intervention Not Indicated  Transportation Interventions Intervention Not Indicated, Other (Comment)  [Receives transportation with [employer]]  Utilities Interventions Intervention Not Indicated        Care Coordination Interventions:  Yes, provided   Follow up plan: Follow up call scheduled for 12/28/22 at 2Pm    Encounter Outcome:  Patient Visit Completed

## 2022-11-24 NOTE — Patient Instructions (Signed)
Visit Information  Thank you for taking time to visit with me today. Please don't hesitate to contact me if I can be of assistance to you.   Following are the goals we discussed today:  SW to submit SCAT application to provider for review and forward completed packet to SCAT.   Our next appointment is by telephone on 12/28/22 at 2pm  Please call the care guide team at [phone] if you need to cancel or reschedule your appointment.   If you are experiencing a Mental Health or Behavioral Health Crisis or need someone to talk to, please call 911  Patient verbalizes understanding of instructions and care plan provided today and agrees to view in MyChart. Active MyChart status and patient understanding of how to access instructions and care plan via MyChart confirmed with patient.     Telephone follow up appointment with care management team member scheduled for:12/28/22 at 2pm.  Lysle Morales, BSW Social Worker [phone]

## 2022-11-24 NOTE — Patient Outreach (Signed)
  Care Coordination   11/24/2022 Name: [name] MRN: [id number] DOB: [birth year]   Care Coordination Outreach Attempts:  An unsuccessful telephone outreach was attempted for a scheduled appointment today.  Follow Up Plan:  Additional outreach attempts will be made to offer the patient care coordination information and services.   Encounter Outcome:  No Answer   Care Coordination Interventions:  No, not indicated    Lysle Morales, BSW Social Worker [facility] Care Management  [phone]

## 2022-11-25 ENCOUNTER — Telehealth: Payer: Self-pay

## 2022-11-25 NOTE — Telephone Encounter (Signed)
Paperwork was given to me yesterday to review and give to patients PCP. I determined that patient has not seen Venita Sheffield, MD yet, however recently seen her NP, Dinah Ngetich.  I called patient to see if he would like appointment with Venita Sheffield, MD sooner to complete paperwork and he responded "No, give paperwork to Dinah to complete or I will have to wait until 12/09/22 when I see Venita Sheffield, MD.   Marybelle Killings placed in Dinah's review and sign folder

## 2022-11-26 DIAGNOSIS — Q438 Other specified congenital malformations of intestine: Secondary | ICD-10-CM | POA: Diagnosis not present

## 2022-11-26 DIAGNOSIS — K562 Volvulus: Secondary | ICD-10-CM | POA: Diagnosis not present

## 2022-11-26 NOTE — Telephone Encounter (Signed)
Patient paperwork has been faxed back to [phone] and sent to scan

## 2022-11-26 NOTE — Telephone Encounter (Signed)
[employer] forms completed given to Estell Harpin to fax or to [employer] [facility] social worker at [employer] [phone] as requested.

## 2022-12-01 ENCOUNTER — Inpatient Hospital Stay: Payer: Medicare Other | Admitting: Sports Medicine

## 2022-12-02 ENCOUNTER — Other Ambulatory Visit: Payer: Self-pay

## 2022-12-02 NOTE — Patient Outreach (Signed)
  Care Management  Transitions of Care Program Transitions of Care Post-discharge week 2  12/02/2022 Name: [name] MRN: [id number] DOB: 07/01/1955  Subjective: [name] is a 67 y.o. year old male who is a primary care patient of Venita Sheffield, MD. The Care Management team was unable to reach the patient by phone to assess and address transitions of care needs.   Plan: Additional outreach attempts will be made to reach the patient enrolled in the [facility] Program (Post Inpatient/ED Visit).    Antionette Fairy, RN,BSN,CCM RN Care Manager Transitions of Care  [place]/Population Health  Direct Phone: [phone] Toll Free: [phone] Fax: [phone]

## 2022-12-02 NOTE — Patient Outreach (Signed)
Care Management  Transitions of Care Program Transitions of Care Post-discharge week 2   12/02/2022 Name: [name] MRN: [id number] DOB: 1955-02-05  Subjective: [name] is a 67 y.o. year old male who is a primary care patient of Venita Sheffield, MD. The Care Management team Engaged with patient by telephone to assess and address transitions of care needs.   Consent to Services:  Patient was given information about care management services, agreed to services, and gave verbal consent to participate.   Assessment:   Incoming call from patient. Patient voices that he is doing well.  He completed GI consult appt and has PCP appt next week. No new issues/concerns.        SDOH Interventions    Flowsheet Row Care Coordination from 11/24/2022 in [facility] Care Coordination Telephone from 11/23/2022 in [place] [facility] DEPARTMENT Telephone from 04/22/2022 in [facility] Community Care Coordination Telephone from 04/20/2022 in [facility] Care Coordination ED to Hosp-Admission (Discharged) from 04/13/2022 in [place] [facility] Office Visit from 01/28/2022 in [facility] at [facility]  SDOH Interventions        Food Insecurity Interventions Intervention Not Indicated, Other (Comment)  Dolores Lory Foodstamps] Intervention Not Indicated -- Intervention Not Indicated -- --  Housing Interventions Intervention Not Indicated -- -- -- -- --  Transportation Interventions Intervention Not Indicated, Other (Comment)  [Receives transportation with [employer]] AMB Referral  [appt scheduled with SW for transportation resources] Other (Comment)  [referral to care guide placed] Other (Comment)  [pt states he has info to call to arrange transportation for follow up appts-denies nerding any furthr transportation resources at this time] Inpatient TOC Taxi Voucher Given  Utilities Interventions Intervention Not  Indicated -- -- -- -- --        Goals Addressed             This Visit's Progress    TOC Care Plan       Current Barriers:  Chronic Disease Management support and education needs related to of chronic conditions  Transportation barriers  RNCM Clinical Goal(s):  Patient will work with the Care Management team over the next 30 days to address Transition of Care Barriers: Transportation take all medications exactly as prescribed and will call provider for medication related questions as evidenced by med adherence attend all scheduled medical appointments:   as evidenced by completion of PCP and surgeon appt work with Child psychotherapist to address  related to the management of Transportation related to the management of transportation barriers as evidenced by review of EMR and patient or social worker report not experience hospital admission as evidenced by review of EMR. Hospital Admissions in last 6 months =    through collaboration with RN Care manager, provider, and care team.   Interventions: Evaluation of current treatment plan related to  self management and patient's adherence to plan as established by provider  Transitions of Care:  Goal on track:  Yes. -Reviewed with pt recent GI consult appt-he voices no surgery at this time -Assessed pt for any changes in condition-pt voices he has had a good week-no GI issues-eating well-only meat he eats is fish,cbg 112 -Discussed  and reviewed with pt recent SW referral for transportation assistance-SCAT-he continues to work with SW-has appt on 12/16-he is using Medicare and Senior resources transportation in the meantime -Reviewed and discussed insurance benefits/services with pt Patient Goals/Self-Care Activities: Patient will maintain safety in the home  Patient will monitor cbgs in the home at least once a day Patient will attend all provider appts and arrange transportation as needed  Follow Up Plan:  Telephone follow up appointment  with care management team member scheduled for:  11/2922-2pm The patient has been provided with contact information for the care management team and has been advised to call with any health related questions or concerns.          Plan: The patient has been provided with contact information for the care management team and has been advised to call with any health related questions or concerns.  Follow up appt scheduled with pt for 12/11/22-2pm.  Antionette Fairy, RN,BSN,CCM RN Care Manager Transitions of Care  -[facility]/Population Health  Direct Phone: [phone] Toll Free: [phone] Fax: [phone]

## 2022-12-02 NOTE — Patient Instructions (Signed)
Visit Information  Thank you for taking time to visit with me today. Please don't hesitate to contact me if I can be of assistance to you before our next scheduled telephone appointment.  Our next appointment is by telephone on 12/11/22 at 2pm  Following is a copy of your care plan:   Goals Addressed             This Visit's Progress    TOC Care Plan       Current Barriers:  Chronic Disease Management support and education needs related to of chronic conditions  Transportation barriers  RNCM Clinical Goal(s):  Patient will work with the Care Management team over the next 30 days to address Transition of Care Barriers: Transportation take all medications exactly as prescribed and will call provider for medication related questions as evidenced by med adherence attend all scheduled medical appointments:   as evidenced by completion of PCP and surgeon appt work with Child psychotherapist to address  related to the management of Transportation related to the management of transportation barriers as evidenced by review of EMR and patient or social worker report not experience hospital admission as evidenced by review of EMR. Hospital Admissions in last 6 months =    through collaboration with RN Care manager, provider, and care team.   Interventions: Evaluation of current treatment plan related to  self management and patient's adherence to plan as established by provider  Transitions of Care:  Goal on track:  Yes. -Reviewed with pt recent GI consult appt-he voices no surgery at this time -Assessed pt for any changes in condition-pt voices he has had a good week-no GI issues-eating well-only meat he eats is fish,cbg 112 -Discussed  and reviewed with pt recent SW referral for transportation assistance-SCAT-he continues to work with SW-has appt on 12/16-he is using Medicare and Senior resources transportation in the meantime -Reviewed and discussed insurance benefits/services with pt Patient  Goals/Self-Care Activities: Patient will maintain safety in the home Patient will monitor cbgs in the home at least once a day Patient will attend all provider appts and arrange transportation as needed  Follow Up Plan:  Telephone follow up appointment with care management team member scheduled for:  11/2922-2pm The patient has been provided with contact information for the care management team and has been advised to call with any health related questions or concerns.          The patient verbalized understanding of instructions, educational materials, and care plan provided today and DECLINED offer to receive copy of patient instructions, educational materials, and care plan.   The patient has been provided with contact information for the care management team and has been advised to call with any health related questions or concerns.   Please call the care guide team at [phone] if you need to cancel or reschedule your appointment.   Please call the Suicide and Crisis Lifeline: 988 call the [place] National Suicide Prevention Lifeline: (430)388-0795 or TTY: 561-188-7036 TTY 831-342-7216) to talk to a trained counselor if you are experiencing a Mental Health or Behavioral Health Crisis or need someone to talk to.  Antionette Fairy, RN,BSN,CCM RN Care Manager Transitions of Care  [place]/Population Health  Direct Phone: [phone] Toll Free: [phone] Fax: [phone]

## 2022-12-09 ENCOUNTER — Ambulatory Visit (INDEPENDENT_AMBULATORY_CARE_PROVIDER_SITE_OTHER): Payer: Medicare Other | Admitting: Sports Medicine

## 2022-12-09 ENCOUNTER — Encounter: Payer: Self-pay | Admitting: Sports Medicine

## 2022-12-09 VITALS — BP 132/78 | HR 77 | Temp 96.5°F | Resp 20 | Ht 71.0 in | Wt 191.2 lb

## 2022-12-09 DIAGNOSIS — K562 Volvulus: Secondary | ICD-10-CM | POA: Diagnosis not present

## 2022-12-09 DIAGNOSIS — Z09 Encounter for follow-up examination after completed treatment for conditions other than malignant neoplasm: Secondary | ICD-10-CM | POA: Diagnosis not present

## 2022-12-09 DIAGNOSIS — Z23 Encounter for immunization: Secondary | ICD-10-CM | POA: Diagnosis not present

## 2022-12-09 DIAGNOSIS — E876 Hypokalemia: Secondary | ICD-10-CM

## 2022-12-09 NOTE — Progress Notes (Signed)
TRANSITION OF CARE VISIT   Primary Care Physician: Venita Sheffield, MD   Date of Admission: 11/18/22 Date of Discharge: 11/22/22  Discharged from:  [facility]   Discharge Diagnosis: Volvulus of colon [facility])    Discharge instructions-  Follow up with PCP in 1-2 weeks Recommend outpatient follow-up with general surgery for consideration of partial colectomy given redundancy of sigmoid colon as patient is high risk for recurrent sigmoid volvulus Please obtain BMP, magnesium, phosphorus level in one week to ensure electrolytes remained stable  Summary of Admission:   Hospital course:   Sigmoid volvulus Patient presenting with progressive abdominal pain, distention and constipation over the previous 6 days.  Patient was afebrile without leukocytosis.  Lactic acid within normal limits.  CT abdomen/pelvis with notable sigmoid volvulus with markedly distended colon.  GI and general surgery were consulted.  Patient underwent flexible sigmoidoscopy with successful suction decompression of volvulus by Dr. Meridee Score on 11/7.  Given redundancy of sigmoid colon, patient deemed high risk of recurrence of volvulus and recommended general surgery for sigmoid plexy versus sigmoidectomy.  Now tolerating advance diet.  General surgery deferring further intervention such as partial colectomy given the redundancy of sigmoid colon to outpatient setting; recommend close outpatient follow-up given risk of recurrence.    TODAY's VISIT  Patient/Caregiver self-reported problems/concerns:  Pt is here for HFU  Has no concerns today  Followed with surgery declined for surgery  Referral placed to GI by gen surgery  Pt denies abdominal pain, nausea, vomiting He did not bring his medicines Lives alone, has HH  Ambulates with a walker  Pt is able to prepare his meals Denies chest pain, palpitations, SOBa, dysuria, hematuria, bloody or dark stools   HTN   132/ 78  Denies feeling dizzy or lightheaded   DM  Reports checking his BG once daily  Fasting around 130's Lab Results  Component Value Date   HGBA1C 6.9 (H) 08/05/2022   HGBA1C 8.3 (H) 04/01/2022   HGBA1C 8.3 (H) 11/26/2021      Patient Active Problem List   Diagnosis Date Noted   Volvulus of colon (HCC) 11/19/2022   Hyponatremia 04/13/2022   High anion gap metabolic acidosis 04/13/2022   Uremia 04/13/2022   Bladder outlet obstruction 04/13/2022   Diabetes due to underlying condition w diabetic nephropathy (HCC) 03/25/2021   Chronic diastolic CHF (congestive heart failure) (HCC) 10/28/2017   History of cerebrovascular accident (CVA) with residual deficit 05/04/2017   CKD (chronic kidney disease) stage 3, GFR 30-59 ml/min (HCC) 05/04/2017   GERD (gastroesophageal reflux disease) 02/08/2015   HTN (hypertension) 07/01/2012   Hyperlipemia 11/21/2010   Gout 11/20/2010   CAD (coronary artery disease) 11/20/2010   Permanent atrial fibrillation (HCC) 11/19/2010   No Known Allergies   Past Medical History:  Diagnosis Date   A-fib (HCC) 11/19/2010   Acute exacerbation of congestive heart failure (HCC) 11/19/2010   Arthritis    CAD (coronary artery disease) 11/20/2010   CHF (congestive heart failure) (HCC)    CKD (chronic kidney disease) stage 3, GFR 30-59 ml/min (HCC) 05/04/2017   Coronary artery disease    Diabetes type 2, controlled (HCC) 11/19/2010   GERD (gastroesophageal reflux disease) 02/08/2015  Gout    Gout 11/20/2010   History of cerebrovascular accident (CVA) with residual deficit 05/04/2017   History of CVA (cerebrovascular accident) 11/19/2010   HTN (hypertension) 07/01/2012   Hyperlipemia 11/21/2010   Hypertension    Hypertensive emergency 11/19/2010   ICH (intracerebral hemorrhage) (HCC) 12/22/2010   Physical deconditioning 12/22/2010   Pulmonary edema 11/19/2010   Respiratory failure (HCC) 11/19/2010   Shortness of breath    Stroke (HCC)  11/19/2010   Thyroiditis 11/20/2010   Past Surgical History:  Procedure Laterality Date   BOWEL DECOMPRESSION N/A 11/19/2022   Procedure: BOWEL DECOMPRESSION;  Surgeon: Lemar Lofty., MD;  Location: [facility] ENDOSCOPY;  Service: Gastroenterology;  Laterality: N/A;   FLEXIBLE SIGMOIDOSCOPY N/A 11/19/2022   Procedure: FLEXIBLE SIGMOIDOSCOPY;  Surgeon: Meridee Score Netty Starring., MD;  Location: [facility] ENDOSCOPY;  Service: Gastroenterology;  Laterality: N/A;   IR ANGIOGRAM PELVIS SELECTIVE OR SUPRASELECTIVE  08/25/2022   IR ANGIOGRAM SELECTIVE EACH ADDITIONAL VESSEL  08/25/2022   IR ANGIOGRAM SELECTIVE EACH ADDITIONAL VESSEL  08/25/2022   IR EMBO TUMOR ORGAN ISCHEMIA INFARCT INC GUIDE ROADMAPPING  08/25/2022   IR RADIOLOGIST EVAL & MGMT  07/13/2022   IR RADIOLOGIST EVAL & MGMT  09/25/2022   IR US GUIDE VASC ACCESS LEFT  08/25/2022   IR US GUIDE VASC ACCESS LEFT  08/25/2022   IR US GUIDE VASC ACCESS RIGHT  08/25/2022   PEG PLACEMENT  12/03/2010   Procedure: PERCUTANEOUS ENDOSCOPIC GASTROSTOMY (PEG) PLACEMENT;  Surgeon: Hart Carwin, MD;  Location: [facility] ENDOSCOPY;  Service: Endoscopy;  Laterality: N/A;   TRACHEOSTOMY TUBE PLACEMENT  11/28/2010   Procedure: TRACHEOSTOMY;  Surgeon: Susy Frizzle, MD;  Location: [facility] OR;  Service: ENT;  Laterality: N/A;    MEDICATIONS Prior to Admission medications   Medication Sig Start Date End Date Taking? Authorizing Provider  acetaminophen (TYLENOL) 500 MG tablet Take 1,000 mg by mouth daily as needed for moderate pain, fever or headache.    [provider]  Alcohol Swabs (ALCOHOL PREP) 70 % PADS Use to test blood sugar daily. Dx: E11.21 11/16/22   Venita Sheffield, MD  apixaban (ELIQUIS) 5 MG TABS tablet Take 1 tablet (5 mg total) by mouth 2 (two) times daily. 10/06/22   Nahser, Deloris Ping, MD  blood glucose meter kit and supplies Dispense based on patient and insurance preference. Use up to four times daily as directed. (FOR ICD-10 E10.9, E11.9). 07/14/21   Frederica Kuster, MD  carvedilol (COREG) 25 MG tablet Take 1 tablet (25 mg total) by mouth 2 (two) times daily with a meal. Needs an appointment before anymore future refills. 08/18/22   Frederica Kuster, MD  diltiazem (CARDIZEM CD) 180 MG 24 hr capsule TAKE 2 CAPSULES BY MOUTH  DAILY AT NOON Patient taking differently: Take 180 mg by mouth See admin instructions. Take 1 capsule by mouth at noon, then 1 capsule at 1600/1700 05/12/22   Swinyer, Zachary George, NP  empagliflozin (JARDIANCE) 10 MG TABS tablet Take 1 tablet (10 mg total) by mouth daily. 02/02/22   Frederica Kuster, MD  glucose blood (ONETOUCH ULTRA) test strip 1 each by Other route daily. Dx: E11.21 04/15/22   Frederica Kuster, MD  isosorbide mononitrate (IMDUR) 30 MG 24 hr tablet Take 1 tablet (30 mg total) by mouth daily. Patient taking differently: Take 30 mg by mouth at bedtime. 04/15/22   Frederica Kuster, MD  metFORMIN (GLUCOPHAGE) 850 MG tablet Take 1 tablet (850 mg total) by mouth daily with breakfast.  11/16/22   Venita Sheffield, MD  Multiple Vitamins-Minerals (MENS ONE DAILY PO) Take 1 tablet by mouth daily.    [provider]  Dola Argyle LANCETS 33G MISC Use as directed to test blood sugar once daily 12/06/17   Marcine Matar, MD  pantoprazole (PROTONIX) 40 MG tablet Take 1 tablet (40 mg total) by mouth daily. Patient taking differently: Take 40 mg by mouth daily in the afternoon. 12/08/21   Frederica Kuster, MD  pravastatin (PRAVACHOL) 40 MG tablet Take 1 tablet (40 mg total) by mouth at bedtime. 11/10/22   Venita Sheffield, MD  spironolactone (ALDACTONE) 25 MG tablet Take 1 tablet (25 mg total) by mouth daily. 09/01/22   Ngetich, Donalee Citrin, NP      Medication List       STOP taking these medications     ciprofloxacin 500 MG tablet Commonly known as: Cipro    finasteride 5 MG tablet Commonly known as: PROSCAR    phenazopyridine 100 MG tablet Commonly known as: Pyridium    sitaGLIPtin 50 MG  tablet Commonly known as: JANUVIA    solifenacin 5 MG tablet Commonly known as: VESIcare    tamsulosin 0.4 MG Caps capsule Commonly known as: FLOMAX           TAKE these medications     acetaminophen 500 MG tablet Commonly known as: TYLENOL Take 1,000 mg by mouth daily as needed for moderate pain, fever or headache.    Alcohol Prep 70 % Pads Use to test blood sugar daily. Dx: E11.21    apixaban 5 MG Tabs tablet Commonly known as: Eliquis Take 1 tablet (5 mg total) by mouth 2 (two) times daily.    blood glucose meter kit and supplies Dispense based on patient and insurance preference. Use up to four times daily as directed. (FOR ICD-10 E10.9, E11.9).    carvedilol 25 MG tablet Commonly known as: COREG Take 1 tablet (25 mg total) by mouth 2 (two) times daily with a meal. Needs an appointment before anymore future refills.    diltiazem 180 MG 24 hr capsule Commonly known as: CARDIZEM CD TAKE 2 CAPSULES BY MOUTH  DAILY AT NOON What changed:  how much to take how to take this when to take this additional instructions    empagliflozin 10 MG Tabs tablet Commonly known as: JARDIANCE Take 1 tablet (10 mg total) by mouth daily.    isosorbide mononitrate 30 MG 24 hr tablet Commonly known as: IMDUR Take 1 tablet (30 mg total) by mouth daily. What changed: when to take this    MENS ONE DAILY PO Take 1 tablet by mouth daily.    metFORMIN 850 MG tablet Commonly known as: GLUCOPHAGE Take 1 tablet (850 mg total) by mouth daily with breakfast.    OneTouch Delica Lancets 33G Misc Use as directed to test blood sugar once daily    OneTouch Ultra test strip Generic drug: glucose blood 1 each by Other route daily. Dx: E11.21    pantoprazole 40 MG tablet Commonly known as: PROTONIX Take 1 tablet (40 mg total) by mouth daily. What changed: when to take this    pravastatin 40 MG tablet Commonly known as: PRAVACHOL Take 1 tablet (40 mg total) by mouth at bedtime.     spironolactone 25 MG tablet Commonly known as: ALDACTONE Take 1 tablet (25 mg total) by mouth daily.             Follow-up Information  LABS  Lab Reviewed: Yes   Review of Systems  Constitutional:  Negative for chills and fever.  HENT:  Negative for congestion and sore throat.   Eyes:  Negative for double vision.  Respiratory:  Negative for cough, sputum production and shortness of breath.   Cardiovascular:  Negative for chest pain, palpitations and leg swelling.  Gastrointestinal:  Negative for abdominal pain, heartburn and nausea.  Genitourinary:  Negative for dysuria, frequency and hematuria.  Musculoskeletal:  Negative for falls and myalgias.  Neurological:  Negative for dizziness, sensory change and focal weakness.    PHYSICAL EXAM:  Physical Exam Constitutional:      Appearance: Normal appearance.  HENT:     Head: Normocephalic and atraumatic.  Cardiovascular:     Rate and Rhythm: Normal rate. Rhythm irregular.     Heart sounds: Normal heart sounds.  Pulmonary:     Effort: No respiratory distress.     Breath sounds: No stridor. No wheezing or rales.  Abdominal:     General: Bowel sounds are normal. There is no distension.     Palpations: Abdomen is soft.     Tenderness: There is no abdominal tenderness. There is no right CVA tenderness or guarding.  Musculoskeletal:        General: No swelling.  Neurological:     Mental Status: He is alert. Mental status is at baseline.     Sensory: No sensory deficit.     Motor: No weakness.       Latest Ref Rng & Units 11/20/2022    6:37 AM 11/19/2022   12:07 AM 11/18/2022    5:18 PM  CBC  WBC 4.0 - 10.5 K/uL 5.8  7.3  6.8   Hemoglobin 13.0 - 17.0 g/dL 41.3  24.4  01.0   Hematocrit 39.0 - 52.0 % 47.1  46.5  47.0   Platelets 150 - 400 K/uL 230  239  263        Latest Ref Rng & Units 11/22/2022    6:06 AM 11/21/2022    4:01 PM 11/21/2022    6:09 AM  BMP  Glucose 70 - 99 mg/dL 272   536   BUN 8 - 23  mg/dL 14   15   Creatinine 6.44 - 1.24 mg/dL 0.34   7.42   Sodium 595 - 145 mmol/L 138   136   Potassium 3.5 - 5.1 mmol/L 3.7  2.5  2.5   Chloride 98 - 111 mmol/L 112   105   CO2 22 - 32 mmol/L 21   22   Calcium 8.9 - 10.3 mg/dL 9.0   8.7      Assessment and plan    1. Hospital discharge follow-up Doing fine Denies abdominal pain, nausea, vomiting  Tolerating po diet  Followed with gen surgery declined surgery  Informed patient about signs of bowel obstruction - Basic Metabolic Panel with eGFR  2. Sigmoid volvulus (HCC) Denies abdominal pain Will check bmp - Basic Metabolic Panel with eGFR  3. Hypokalemia  - Basic Metabolic Panel with eGFR  4. Hypophosphatemia  - Phosphorus  5. Hypomagnesemia  - Magnesium      PATIENT EDUCATION PROVIDED: See AVS   FOLLOW-UP:  3 months

## 2022-12-10 LAB — BASIC METABOLIC PANEL WITH GFR
BUN: 20 mg/dL (ref 7–25)
CO2: 27 mmol/L (ref 20–32)
Calcium: 10.1 mg/dL (ref 8.6–10.3)
Chloride: 100 mmol/L (ref 98–110)
Creat: 1.24 mg/dL (ref 0.70–1.35)
Glucose, Bld: 159 mg/dL — ABNORMAL HIGH (ref 65–99)
Potassium: 4.9 mmol/L (ref 3.5–5.3)
Sodium: 136 mmol/L (ref 135–146)
eGFR: 63 mL/min/{1.73_m2} (ref >=60)

## 2022-12-10 LAB — PHOSPHORUS: Phosphorus: 4 mg/dL (ref 2.1–4.3)

## 2022-12-10 LAB — MAGNESIUM: Magnesium: 2.1 mg/dL (ref 1.5–2.5)

## 2022-12-11 ENCOUNTER — Other Ambulatory Visit: Payer: Self-pay

## 2022-12-11 NOTE — Patient Outreach (Signed)
  Care Management  Transitions of Care Program Transitions of Care Post-discharge week 4   12/11/2022 Name: [name] MRN: [id number] DOB: Sep 16, 1955  Subjective: [name] is a 67 y.o. year old male who is a primary care patient of Venita Sheffield, MD. The Care Management team Engaged with patient by telephone to assess and address transitions of care needs.   Consent to Services:  Patient was given information about care management services, agreed to services, and gave verbal consent to participate.   Assessment:   Pt voices he is doing well-denies any acute issues or concerns at present. He competed PCP follow up appt. No changes to plan of care noted. Denies any RN CM needs or concerns at this time.         SDOH Interventions    Flowsheet Row Care Coordination from 11/24/2022 in [facility] Care Coordination Telephone from 11/23/2022 in [place] POPULATION HEALTH DEPARTMENT Telephone from 04/22/2022 in [facility] Community Care Coordination Telephone from 04/20/2022 in [facility] Community Care Coordination ED to Hosp-Admission (Discharged) from 04/13/2022 in [place] [facility] Office Visit from 01/28/2022 in [facility]  SDOH Interventions        Food Insecurity Interventions Intervention Not Indicated, Other (Comment)  Dolores Lory Foodstamps] Intervention Not Indicated -- Intervention Not Indicated -- --  Housing Interventions Intervention Not Indicated -- -- -- -- --  Transportation Interventions Intervention Not Indicated, Other (Comment)  [Receives transportation with [employer]] AMB Referral  [appt scheduled with SW for transportation resources] Other (Comment)  [referral to care guide placed] Other (Comment)  [pt states he has info to call to arrange transportation for follow up appts-denies nerding any furthr transportation resources at this time] Inpatient TOC Taxi Voucher Given   Utilities Interventions Intervention Not Indicated -- -- -- -- --        Goals Addressed   None     Plan: The patient has been provided with contact information for the care management team and has been advised to call with any health related questions or concerns.  Follow up appt scheduled with pt for 12/17/22-2pm   Antionette Fairy, RN,BSN,CCM RN Care Manager Transitions of Care  [facility]  Direct Phone: [phone] Toll Free: [phone] Fax: [phone]

## 2022-12-11 NOTE — Patient Instructions (Signed)
Visit Information  Thank you for taking time to visit with me today. Please don't hesitate to contact me if I can be of assistance to you before our next scheduled telephone appointment.  Our next appointment is by telephone on 12/17/22 at 2pm  Following is a copy of your care plan:   Goals Addressed   None     The patient verbalized understanding of instructions, educational materials, and care plan provided today and DECLINED offer to receive copy of patient instructions, educational materials, and care plan.   The patient has been provided with contact information for the care management team and has been advised to call with any health related questions or concerns.   Please call the care guide team at [phone] if you need to cancel or reschedule your appointment.   Please call the Suicide and Crisis Lifeline: 988 call the [place] National Suicide Prevention Lifeline: 610-566-3664 or TTY: 305-740-6475 TTY 470 185 6153) to talk to a trained counselor if you are experiencing a Mental Health or Behavioral Health Crisis or need someone to talk to.   Antionette Fairy, RN,BSN,CCM RN Care Manager Transitions of Care  [facility]  Direct Phone: [phone] Toll Free: [phone] Fax: [phone]

## 2022-12-13 DIAGNOSIS — E119 Type 2 diabetes mellitus without complications: Secondary | ICD-10-CM | POA: Diagnosis not present

## 2022-12-14 ENCOUNTER — Other Ambulatory Visit: Payer: Self-pay | Admitting: *Deleted

## 2022-12-14 MED ORDER — EMPAGLIFLOZIN 10 MG PO TABS: 10.0000 mg | ORAL_TABLET | Freq: Every day | ORAL | 1 refills | Status: DC

## 2022-12-14 NOTE — Telephone Encounter (Signed)
[facility] requested refill.

## 2022-12-17 ENCOUNTER — Other Ambulatory Visit: Payer: Self-pay

## 2022-12-17 NOTE — Patient Instructions (Signed)
Visit Information  Thank you for taking time to visit with me today. Please don't hesitate to contact me if I can be of assistance to you before our next scheduled telephone appointment.  Our next appointment is by telephone on 12/24/22 at 2pm  Following is a copy of your care plan:   Goals Addressed             This Visit's Progress    TOC Care Plan       Current Barriers:  Chronic Disease Management support and education needs related to of chronic conditions  Transportation barriers  RNCM Clinical Goal(s):  Patient will work with the Care Management team over the next 30 days to address Transition of Care Barriers: Transportation take all medications exactly as prescribed and will call provider for medication related questions as evidenced by med adherence attend all scheduled medical appointments:   as evidenced by completion of PCP and surgeon appt work with Child psychotherapist to address  related to the management of Transportation related to the management of transportation barriers as evidenced by review of EMR and patient or social worker report not experience hospital admission as evidenced by review of EMR. Hospital Admissions in last 6 months =    through collaboration with RN Care manager, provider, and care team.   Interventions: Evaluation of current treatment plan related to  self management and patient's adherence to plan as established by provider  Transitions of Care:  Goal on track:  Yes. -Reviewed with pt in changes in condition since last call-pt states he ha had a good week-no new issues or concerns-his caregiver through insurance agency just left-comes about once a week-every other week for a few hrs -Discussed importance of completing annual/routine preventive exams and appts-pt plans to make colonoscopy appt at beginning of the year, he saw podiatrist recently, will work on establishing care with dentist   Patient Goals/Self-Care Activities: Patient will  maintain safety in the home Patient will monitor cbgs in the home at least once a day Patient will attend all provider appts and arrange transportation as needed Patient will look into finding in-network dentist  Follow Up Plan:  Telephone follow up appointment with care management team member scheduled for:  12/24/22-2pm The patient has been provided with contact information for the care management team and has been advised to call with any health related questions or concerns.          The patient verbalized understanding of instructions, educational materials, and care plan provided today and DECLINED offer to receive copy of patient instructions, educational materials, and care plan.   The patient has been provided with contact information for the care management team and has been advised to call with any health related questions or concerns.   Please call the care guide team at [phone] if you need to cancel or reschedule your appointment.   Please call the Suicide and Crisis Lifeline: 988 call the [place] National Suicide Prevention Lifeline: 614-417-1779 or TTY: 716-224-8954 TTY 562 397 9433) to talk to a trained counselor if you are experiencing a Mental Health or Behavioral Health Crisis or need someone to talk to.  Antionette Fairy, RN,BSN,CCM RN Care Manager Transitions of Care  [place]Population [facility]  Direct Phone: [phone] Toll Free: [phone] Fax: [phone]

## 2022-12-17 NOTE — Patient Outreach (Signed)
Care Management  Transitions of Care Program Transitions of Care Post-discharge week 4   12/17/2022 Name: [name] MRN: [id number] DOB: September 23, 1955  Subjective: [name] is a 67 y.o. year old male who is a primary care patient of Venita Sheffield, MD. The Care Management team  Engaged with patient by telephone to assess and address transitions of care needs.   Consent to Services:  Patient was given information about care management services, agreed to services, and gave verbal consent to participate.   Assessment:   Incoming call form patient retuning RN CM call. He voices things going good. No new issues or concerns. He continues to do well. Denies any RN CM needs or concerns at this time.       SDOH Interventions    Flowsheet Row Care Coordination from 11/24/2022 in [facility] Care Coordination Telephone from 11/23/2022 in [place] [facility] DEPARTMENT Telephone from 04/22/2022 in [facility] Care Coordination Telephone from 04/20/2022 in [facility] Care Coordination ED to Hosp-Admission (Discharged) from 04/13/2022 in [facility] Office Visit from 01/28/2022 in [facility]  SDOH Interventions        Food Insecurity Interventions Intervention Not Indicated, Other (Comment)  Dolores Lory Foodstamps] Intervention Not Indicated -- Intervention Not Indicated -- --  Housing Interventions Intervention Not Indicated -- -- -- -- --  Transportation Interventions Intervention Not Indicated, Other (Comment)  [Receives transportation with [employer]] AMB Referral  [appt scheduled with SW for transportation resources] Other (Comment)  [referral to care guide placed] Other (Comment)  [pt states he has info to call to arrange transportation for follow up appts-denies nerding any furthr transportation resources at this time] Inpatient TOC Taxi Voucher Given  Utilities  Interventions Intervention Not Indicated -- -- -- -- --        Goals Addressed             This Visit's Progress    TOC Care Plan       Current Barriers:  Chronic Disease Management support and education needs related to of chronic conditions  Transportation barriers  RNCM Clinical Goal(s):  Patient will work with the Care Management team over the next 30 days to address Transition of Care Barriers: Transportation take all medications exactly as prescribed and will call provider for medication related questions as evidenced by med adherence attend all scheduled medical appointments:   as evidenced by completion of PCP and surgeon appt work with Child psychotherapist to address  related to the management of Transportation related to the management of transportation barriers as evidenced by review of EMR and patient or social worker report not experience hospital admission as evidenced by review of EMR. Hospital Admissions in last 6 months =    through collaboration with RN Care manager, provider, and care team.   Interventions: Evaluation of current treatment plan related to  self management and patient's adherence to plan as established by provider  Transitions of Care:  Goal on track:  Yes. -Reviewed with pt in changes in condition since last call-pt states he ha had a good week-no new issues or concerns-his caregiver through insurance agency just left-comes about once a week-every other week for a few hrs -Discussed importance of completing annual/routine preventive exams and appts-pt plans to make colonoscopy appt at beginning of the year, he saw podiatrist recently, will work on establishing care with dentist   Patient Goals/Self-Care Activities: Patient will maintain safety  in the home Patient will monitor cbgs in the home at least once a day Patient will attend all provider appts and arrange transportation as needed Patient will look into finding in-network dentist  Follow Up  Plan:  Telephone follow up appointment with care management team member scheduled for:  12/24/22-2pm The patient has been provided with contact information for the care management team and has been advised to call with any health related questions or concerns.          Plan: The patient has been provided with contact information for the care management team and has been advised to call with any health related questions or concerns.  Follow up appt made with pt for 12/24/22-2pm.   Antionette Fairy, RN,BSN,CCM RN Care Manager Transitions of Care  [place]Population [facility]  Direct Phone: [phone] Toll Free: [phone] Fax: [phone]

## 2022-12-17 NOTE — Patient Outreach (Signed)
  Care Management  Transitions of Care Program Transitions of Care Post-discharge week 4  12/17/2022 Name: [name] MRN: [id number] DOB: August 31, 1955  Subjective: [name] is a 67 y.o. year old male who is a primary care patient of Venita Sheffield, MD. The Care Management team spoke with patient by telephone to assess and address transitions of care needs. Patient reported his caregiver was in the home assisting him and he would call RN CM back later.      Plan: Additional outreach attempts will be made to reach the patient enrolled in the [facility] Program (Post Inpatient/ED Visit).  Antionette Fairy, RN,BSN,CCM RN Care Manager Transitions of Care  [place]Population [facility]  Direct Phone: [phone] Toll Free: [phone] Fax: [phone]

## 2022-12-24 ENCOUNTER — Other Ambulatory Visit: Payer: Self-pay

## 2022-12-24 NOTE — Patient Instructions (Signed)
Visit Information  Thank you for taking time to visit with me today. Please don't hesitate to contact me if I can be of assistance to you in the future.     Following is a copy of your care plan:   Goals Addressed             This Visit's Progress    COMPLETED: TOC Care Plan       Current Barriers:  Chronic Disease Management support and education needs related to of chronic conditions  Transportation barriers  RNCM Clinical Goal(s):  Patient will work with the Care Management team over the next 30 days to address Transition of Care Barriers: Transportation take all medications exactly as prescribed and will call provider for medication related questions as evidenced by med adherence attend all scheduled medical appointments:   as evidenced by completion of PCP and surgeon appt work with Child psychotherapist to address  related to the management of Transportation related to the management of transportation barriers as evidenced by review of EMR and patient or social worker report not experience hospital admission as evidenced by review of EMR. Hospital Admissions in last 6 months =    through collaboration with RN Care manager, provider, and care team.   Interventions: Evaluation of current treatment plan related to  self management and patient's adherence to plan as established by provider  Transitions of Care:  Goal Met. -Reviewed with pt in changes in condition since last call-pt continues to do well-no acute issues or concerns at present   Patient Goals/Self-Care Activities: Patient will maintain safety in the home Patient will monitor cbgs in the home at least once a day Patient will attend all provider appts and arrange transportation as needed Patient will look into finding in-network dentist  Follow Up Plan:  Patient has successfully completed 30-day TOC program. Declined transfer to longitudinal RN CM.  The patient has been provided with contact information for the care  management team and has been advised to call with any health related questions or concerns.          The patient verbalized understanding of instructions, educational materials, and care plan provided today and DECLINED offer to receive copy of patient instructions, educational materials, and care plan.   The patient has been provided with contact information for the care management team and has been advised to call with any health related questions or concerns.   Please call the care guide team at [phone] if you need to cancel or reschedule your appointment.   Please call 1-800-273-TALK (toll free, 24 hour hotline) go to [facility] [address] [place] [phone]) if you are experiencing a Mental Health or Behavioral Health Crisis or need someone to talk to.  Antionette Fairy, RN,BSN,CCM RN Care Manager Transitions of Care  [place]Population Health  Direct Phone: [phone] Toll Free: [phone] Fax: [phone]

## 2022-12-24 NOTE — Patient Outreach (Signed)
Care Management  Transitions of Care Program Transitions of Care Post-discharge Week 5   12/24/2022 Name: [name] MRN: [id number] DOB: Feb 15, 1955  Subjective: [name] is a 67 y.o. year old male who is a primary care patient of Venita Sheffield, MD. The Care Management team Engaged with patient by telephone to assess and address transitions of care needs.   Consent to Services:  Patient has successfully completed 30-day TOC program. Declined transfer to longitudinal RN CM.   Assessment:   Patient voices things continue to go well. No new issues or concerns. He has had a good week so far. No upcoming appts-will be making annual and preventive exams and appts in the beginning of the year. Denies any RN CM needs or concerns at this time. Patient has RN CM contact info to call for any future needs or concerns.         SDOH Interventions    Flowsheet Row Care Coordination from 11/24/2022 in [facility] Care Coordination Telephone from 11/23/2022 in [place] POPULATION HEALTH DEPARTMENT Telephone from 04/22/2022 in [facility] Care Coordination Telephone from 04/20/2022 in [facility] Care Coordination ED to Hosp-Admission (Discharged) from 04/13/2022 in [place] [facility] Office Visit from 01/28/2022 in [facility]  SDOH Interventions        Food Insecurity Interventions Intervention Not Indicated, Other (Comment)  Dolores Lory Foodstamps] Intervention Not Indicated -- Intervention Not Indicated -- --  Housing Interventions Intervention Not Indicated -- -- -- -- --  Transportation Interventions Intervention Not Indicated, Other (Comment)  [Receives transportation with [employer]] AMB Referral  [appt scheduled with SW for transportation resources] Other (Comment)  [referral to care guide placed] Other (Comment)  [pt states he has info to call to arrange transportation for follow  up appts-denies nerding any furthr transportation resources at this time] Inpatient TOC Taxi Voucher Given  Utilities Interventions Intervention Not Indicated -- -- -- -- --        Goals Addressed             This Visit's Progress    COMPLETED: TOC Care Plan       Current Barriers:  Chronic Disease Management support and education needs related to of chronic conditions  Transportation barriers  RNCM Clinical Goal(s):  Patient will work with the Care Management team over the next 30 days to address Transition of Care Barriers: Transportation take all medications exactly as prescribed and will call provider for medication related questions as evidenced by med adherence attend all scheduled medical appointments:   as evidenced by completion of PCP and surgeon appt work with Child psychotherapist to address  related to the management of Transportation related to the management of transportation barriers as evidenced by review of EMR and patient or social worker report not experience hospital admission as evidenced by review of EMR. Hospital Admissions in last 6 months =    through collaboration with RN Care manager, provider, and care team.   Interventions: Evaluation of current treatment plan related to  self management and patient's adherence to plan as established by provider  Transitions of Care:  Goal Met. -Reviewed with pt in changes in condition since last call-pt continues to do well-no acute issues or concerns at present   Patient Goals/Self-Care Activities: Patient will maintain safety in the home Patient will monitor cbgs in the home at least once a day Patient will attend all provider appts and arrange transportation  as needed Patient will look into finding in-network dentist  Follow Up Plan:  Patient has successfully completed 30-day TOC program. Declined transfer to longitudinal RN CM.  The patient has been provided with contact information for the care management team and  has been advised to call with any health related questions or concerns.          Plan: The patient has been provided with contact information for the care management team and has been advised to call with any health related questions or concerns.     Antionette Fairy, RN,BSN,CCM RN Care Manager Transitions of Care  [place]/Population Health  Direct Phone: [phone] Toll Free: [phone] Fax: [phone]

## 2022-12-28 ENCOUNTER — Ambulatory Visit: Payer: Self-pay

## 2022-12-28 NOTE — Patient Outreach (Signed)
  Care Coordination   12/28/2022 Name: ARIAM LAMPER MRN: [id number] DOB: 1955-09-25   Care Coordination Outreach Attempts:  An unsuccessful telephone outreach was attempted for a scheduled appointment today.  Follow Up Plan:  Additional outreach attempts will be made to offer the patient care coordination information and services.   Encounter Outcome:  No Answer   Care Coordination Interventions:  No, not indicated    SIG Lysle Morales, BSW Social Worker [phone]

## 2023-01-04 NOTE — Addendum Note (Signed)
Encounter addended by: Arby Barrette on: 01/04/2023 11:22 AM  Actions taken: Imaging Exam ended

## 2023-01-13 DIAGNOSIS — E119 Type 2 diabetes mellitus without complications: Secondary | ICD-10-CM | POA: Diagnosis not present

## 2023-01-19 ENCOUNTER — Ambulatory Visit (INDEPENDENT_AMBULATORY_CARE_PROVIDER_SITE_OTHER): Payer: Medicare Other | Admitting: Sports Medicine

## 2023-01-19 ENCOUNTER — Encounter: Payer: Self-pay | Admitting: Sports Medicine

## 2023-01-19 VITALS — BP 122/100 | HR 73 | Temp 97.0°F | Resp 16 | Ht 71.0 in | Wt 193.4 lb

## 2023-01-19 DIAGNOSIS — I6930 Unspecified sequelae of cerebral infarction: Secondary | ICD-10-CM

## 2023-01-19 DIAGNOSIS — I10 Essential (primary) hypertension: Secondary | ICD-10-CM | POA: Diagnosis not present

## 2023-01-19 DIAGNOSIS — Z1211 Encounter for screening for malignant neoplasm of colon: Secondary | ICD-10-CM

## 2023-01-19 DIAGNOSIS — K029 Dental caries, unspecified: Secondary | ICD-10-CM

## 2023-01-19 DIAGNOSIS — R531 Weakness: Secondary | ICD-10-CM | POA: Diagnosis not present

## 2023-01-19 DIAGNOSIS — E1121 Type 2 diabetes mellitus with diabetic nephropathy: Secondary | ICD-10-CM

## 2023-01-19 NOTE — Progress Notes (Signed)
 Careteam: Patient Care Team: Sherlynn Madden, MD as PCP - General (Internal Medicine) Nahser, Aleene PARAS, MD as PCP - Cardiology (Cardiology) Loetta Nottingham, MD (Endocrinology) Darina Shasta HERO, CPhT as [facility] Care Management (Pharmacy Technician) Carolynn Tillman KATHEE georgann DESIREE Care Management  PLACE OF SERVICE:  [facility]  Advanced Directive information Does Patient Have a Medical Advance Directive?: Yes, Type of Advance Directive: Healthcare Power of [place];Living will, Does patient want to make changes to medical advance directive?: No - Patient declined  No Known Allergies  Chief Complaint  Patient presents with   Referral    Patient is requesting referral to Neurology.    Lab results     Discuss lab results.    Discussed the use of AI scribe software for clinical note transcription with the patient, who gave verbal consent to proceed.  History of Present Illness   The patient, with a history of stroke approximately ten years ago, presents with intermittent issues related to vision and hand strength. He reports episodes of blurred vision and double vision, the latter occurring about two weeks ago. He also describes intermittent weakness in his left hand, making it difficult to grasp and lift objects. This weakness is not constant but has occurred twice, most recently about a month ago. He also experienced an episode of loss of control in his left arm upon waking, which resolved on its own. The patient denies any associated numbness or tingling.  The patient lives alone ,   denies chest pain, shortness of breath, or palpitations.  He denies any recent falls, except for one incident when getting into a van. SABRA His last eye doctor visit was about a month ago, with no reported issues.  The patient requests referrals to a neurologist, gastroenterologist, and dentist. He has not had a recent colonoscopy and would like to have his teeth cleaned. He manages to  move around his house without the aid of a walker and has no reported issues with his balance.       Past Medical History:  Diagnosis Date   A-fib (HCC) 11/19/2010   Acute exacerbation of congestive heart failure (HCC) 11/19/2010   Arthritis    CAD (coronary artery disease) 11/20/2010   CHF (congestive heart failure) (HCC)    CKD (chronic kidney disease) stage 3, GFR 30-59 ml/min (HCC) 05/04/2017   Coronary artery disease    Diabetes type 2, controlled (HCC) 11/19/2010   GERD (gastroesophageal reflux disease) 02/08/2015   Gout    Gout 11/20/2010   History of cerebrovascular accident (CVA) with residual deficit 05/04/2017   History of CVA (cerebrovascular accident) 11/19/2010   HTN (hypertension) 07/01/2012   Hyperlipemia 11/21/2010   Hypertension    Hypertensive emergency 11/19/2010   ICH (intracerebral hemorrhage) (HCC) 12/22/2010   Physical deconditioning 12/22/2010   Pulmonary edema 11/19/2010   Respiratory failure (HCC) 11/19/2010   Shortness of breath    Stroke (HCC) 11/19/2010   Thyroiditis 11/20/2010   Past Surgical History:  Procedure Laterality Date   BOWEL DECOMPRESSION N/A 11/19/2022   Procedure: BOWEL DECOMPRESSION;  Surgeon: Wilhelmenia Aloha Raddle., MD;  Location: [facility] ENDOSCOPY;  Service: Gastroenterology;  Laterality: N/A;   FLEXIBLE SIGMOIDOSCOPY N/A 11/19/2022   Procedure: FLEXIBLE SIGMOIDOSCOPY;  Surgeon: Wilhelmenia Aloha Raddle., MD;  Location: [facility] ENDOSCOPY;  Service: Gastroenterology;  Laterality: N/A;   IR ANGIOGRAM PELVIS SELECTIVE OR SUPRASELECTIVE  08/25/2022   IR ANGIOGRAM SELECTIVE EACH ADDITIONAL VESSEL  08/25/2022   IR ANGIOGRAM SELECTIVE EACH ADDITIONAL VESSEL  08/25/2022  IR EMBO TUMOR ORGAN ISCHEMIA INFARCT INC GUIDE ROADMAPPING  08/25/2022   IR RADIOLOGIST EVAL & MGMT  07/13/2022   IR RADIOLOGIST EVAL & MGMT  09/25/2022   IR US  GUIDE VASC ACCESS LEFT  08/25/2022   IR US  GUIDE VASC ACCESS LEFT  08/25/2022   IR US  GUIDE VASC ACCESS RIGHT  08/25/2022   PEG  PLACEMENT  12/03/2010   Procedure: PERCUTANEOUS ENDOSCOPIC GASTROSTOMY (PEG) PLACEMENT;  Surgeon: Princella CHRISTELLA Nida, MD;  Location: [facility] ENDOSCOPY;  Service: Endoscopy;  Laterality: N/A;   TRACHEOSTOMY TUBE PLACEMENT  11/28/2010   Procedure: TRACHEOSTOMY;  Surgeon: Ida VEAR Loader, MD;  Location: [facility] OR;  Service: ENT;  Laterality: N/A;   Social History:   reports that he quit smoking about 17 years ago. His smoking use included cigarettes. He has never used smokeless tobacco. He reports that he does not drink alcohol  and does not use drugs.  Family History  Problem Relation Age of Onset   Diabetes Mother    Diabetes Brother    Colon cancer Neg Hx    Colon polyps Neg Hx    Kidney disease Neg Hx    Esophageal cancer Neg Hx    Heart disease Neg Hx    Gallbladder disease Neg Hx     Medications: Patient's Medications  New Prescriptions   No medications on file  Previous Medications   ACETAMINOPHEN  (TYLENOL ) 500 MG TABLET    Take 1,000 mg by mouth daily as needed for moderate pain, fever or headache.   ALCOHOL  SWABS (ALCOHOL  PREP) 70 % PADS    Use to test blood sugar daily. Dx: E11.21   APIXABAN  (ELIQUIS ) 5 MG TABS TABLET    Take 1 tablet (5 mg total) by mouth 2 (two) times daily.   BLOOD GLUCOSE METER KIT AND SUPPLIES    Dispense based on patient and insurance preference. Use up to four times daily as directed. (FOR ICD-10 E10.9, E11.9).   CARVEDILOL  (COREG ) 25 MG TABLET    Take 1 tablet (25 mg total) by mouth 2 (two) times daily with a meal. Needs an appointment before anymore future refills.   DILTIAZEM  (CARDIZEM  CD) 180 MG 24 HR CAPSULE    TAKE 2 CAPSULES BY MOUTH  DAILY AT NOON   EMPAGLIFLOZIN  (JARDIANCE ) 10 MG TABS TABLET    Take 1 tablet (10 mg total) by mouth daily.   GLUCOSE BLOOD (ONETOUCH ULTRA) TEST STRIP    1 each by Other route daily. Dx: E11.21   ISOSORBIDE  MONONITRATE (IMDUR ) 30 MG 24 HR TABLET    Take 1 tablet (30 mg total) by mouth daily.   METFORMIN  (GLUCOPHAGE ) 850 MG TABLET     Take 1 tablet (850 mg total) by mouth daily with breakfast.   MULTIPLE VITAMINS-MINERALS (MENS ONE DAILY PO)    Take 1 tablet by mouth daily.   ONETOUCH DELICA LANCETS 33G MISC    Use as directed to test blood sugar once daily   PANTOPRAZOLE  (PROTONIX ) 40 MG TABLET    Take 1 tablet (40 mg total) by mouth daily.   PRAVASTATIN  (PRAVACHOL ) 40 MG TABLET    Take 1 tablet (40 mg total) by mouth at bedtime.   SPIRONOLACTONE  (ALDACTONE ) 25 MG TABLET    Take 1 tablet (25 mg total) by mouth daily.  Modified Medications   No medications on file  Discontinued Medications   No medications on file    Physical Exam: Vitals:   01/19/23 1113  BP: (!) 120/90  Pulse: 73  Resp:  16  Temp: (!) 97 F (36.1 C)  SpO2: 98%  Weight: 193 lb 6.4 oz (87.7 kg)  Height: 5' 11 (1.803 m)   Body mass index is 26.97 kg/m. BP Readings from Last 3 Encounters:  01/19/23 (!) 120/90  12/09/22 132/78  11/22/22 (!) 135/95   Wt Readings from Last 3 Encounters:  01/19/23 193 lb 6.4 oz (87.7 kg)  12/09/22 191 lb 3.2 oz (86.7 kg)  11/21/22 179 lb 9.6 oz (81.5 kg)    Physical Exam Constitutional:      Appearance: Normal appearance.  HENT:     Head: Normocephalic and atraumatic.  Cardiovascular:     Rate and Rhythm: Normal rate and regular rhythm.     Pulses: Normal pulses.     Heart sounds: Normal heart sounds.  Pulmonary:     Effort: No respiratory distress.     Breath sounds: No stridor. No wheezing or rales.  Abdominal:     General: Bowel sounds are normal. There is no distension.     Palpations: Abdomen is soft.     Tenderness: There is no abdominal tenderness. There is no right CVA tenderness or guarding.  Musculoskeletal:        General: No swelling.  Neurological:     Mental Status: He is alert. Mental status is at baseline.     Comments:  Extraocular movements intact, visual tracking without difficulty. Facial strength symmetrical during eye closure and opening. Upper and lower extremity strength  preserved, able to push and pull against resistance, stand from a seated position without difficulty. Finger-to-nose test performed with difficulty using right hand, indicating potential coordination or motor function impairment from previous stroke Pt refused to get up on the table, unable to perform heel to shin test      Labs reviewed: Basic Metabolic Panel: Recent Labs    08/05/22 1203 08/25/22 0800 11/21/22 0609 11/21/22 1601 11/22/22 0606 12/09/22 1330  NA 136   < > 136  --  138 136  K 5.2   < > 2.5* 2.5* 3.7 4.9  CL 99   < > 105  --  112* 100  CO2 28   < > 22  --  21* 27  GLUCOSE 123   < > 133*  --  161* 159*  BUN 25   < > 15  --  14 20  CREATININE 1.53*   < > 1.21  --  1.00 1.24  CALCIUM  10.4*   < > 8.7*  --  9.0 10.1  MG  --    < > 2.0  --  1.8 2.1  PHOS  --    < > 2.5  --  2.4* 4.0  TSH 3.63  --   --   --   --   --    < > = values in this interval not displayed.   Liver Function Tests: Recent Labs    04/13/22 0756 04/23/22 1458 08/05/22 1203 11/18/22 1718  AST 22 18 12 24   ALT 16 19 8* 31  ALKPHOS 56  --   --  55  BILITOT 0.5 0.4 0.5 0.9  PROT 7.4 7.0 7.4 7.3  ALBUMIN 3.2*  --   --  3.9   Recent Labs    04/13/22 0756 11/18/22 1718  LIPASE 29 26   No results for input(s): AMMONIA in the last 8760 hours. CBC: Recent Labs    08/05/22 1203 08/25/22 0800 11/18/22 1718 11/19/22 0007 11/20/22 0637  WBC 6.0 5.0 6.8  7.3 5.8  NEUTROABS 2,640 2.2  --  4.3  --   HGB 14.2 14.0 15.4 15.3 16.0  HCT 43.7 44.4 47.0 46.5 47.1  MCV 91.2 94.1 90.2 90.3 89.0  PLT 244 208 263 239 230   Lipid Panel: Recent Labs    08/05/22 1203  CHOL 130  HDL 56  LDLCALC 54  TRIG 121  CHOLHDL 2.3   TSH: Recent Labs    08/05/22 1203  TSH 3.63   A1C: Lab Results  Component Value Date   HGBA1C 6.9 (H) 08/05/2022     Assessment/Plan  History of cerebrovascular accident (CVA) with residual deficit Reports intermittent weakness in left hand and transient double  vision two weeks ago. No recent dysphagia, numbness, or tingling. No recent falls. -Order CT scan to assess for any new cerebrovascular events. -Refer to neurologist for further evaluation and management.  Ambulatory referral to Neurology - CT HEAD WO CONTRAST ( ); Future  Visual Disturbance Reports blurry vision and one episode of double vision two weeks ago. Last eye exam one month ago. -Refer to ophthalmologist for further evaluation.  Dental Health Request for routine dental cleaning. -Refer to dentist for routine dental cleaning.     Controlled type 2 diabetes mellitus with diabetic nephropathy, without long-term current use of insulin  (HCC)   Lab Results  Component Value Date   HGBA1C 6.9 (H) 08/05/2022   HGBA1C 8.3 (H) 04/01/2022   HGBA1C 8.3 (H) 11/26/2021   At goal  Cont with the same    Essential hypertension   Instructed to avoid salt intake  Exercise regularly    Colon cancer screening   - Ambulatory referral to Gastroenterology    Dental caries  Ambulatory referral to Dentistry   45 Total time spent for obtaining history,  performing a medically appropriate examination and evaluation, reviewing the tests,ordering  tests,  documenting clinical information in the electronic or other health record, independently interpreting results ,care coordination (not separately reported)

## 2023-02-03 ENCOUNTER — Ambulatory Visit: Payer: Medicare Other | Admitting: Family Medicine

## 2023-02-03 ENCOUNTER — Ambulatory Visit: Payer: Medicare Other | Admitting: Sports Medicine

## 2023-02-10 ENCOUNTER — Encounter: Payer: Self-pay | Admitting: Sports Medicine

## 2023-02-13 DIAGNOSIS — E119 Type 2 diabetes mellitus without complications: Secondary | ICD-10-CM | POA: Diagnosis not present

## 2023-02-16 ENCOUNTER — Ambulatory Visit
Admission: RE | Admit: 2023-02-16 | Discharge: 2023-02-16 | Disposition: A | Discharge status: Home / Self Care | Payer: Medicare Other | Source: Ambulatory Visit | Attending: Sports Medicine | Admitting: Sports Medicine

## 2023-02-16 DIAGNOSIS — R531 Weakness: Secondary | ICD-10-CM

## 2023-02-16 DIAGNOSIS — I6389 Other cerebral infarction: Secondary | ICD-10-CM | POA: Diagnosis not present

## 2023-02-20 ENCOUNTER — Other Ambulatory Visit: Payer: Self-pay | Admitting: Family

## 2023-02-20 DIAGNOSIS — I10 Essential (primary) hypertension: Secondary | ICD-10-CM

## 2023-02-22 ENCOUNTER — Other Ambulatory Visit: Payer: Self-pay | Admitting: *Deleted

## 2023-02-22 DIAGNOSIS — I2510 Atherosclerotic heart disease of native coronary artery without angina pectoris: Secondary | ICD-10-CM

## 2023-02-22 DIAGNOSIS — I10 Essential (primary) hypertension: Secondary | ICD-10-CM

## 2023-02-22 MED ORDER — CARVEDILOL 25 MG PO TABS: 25.0000 mg | ORAL_TABLET | Freq: Two times a day (BID) | ORAL | 1 refills | Status: DC

## 2023-02-22 NOTE — Telephone Encounter (Signed)
 Pharmacy requested refill

## 2023-02-24 ENCOUNTER — Encounter: Payer: Self-pay | Admitting: Podiatry

## 2023-02-24 ENCOUNTER — Ambulatory Visit (INDEPENDENT_AMBULATORY_CARE_PROVIDER_SITE_OTHER): Payer: Medicare Other | Admitting: Podiatry

## 2023-02-24 VITALS — Ht 71.0 in | Wt 193.4 lb

## 2023-02-24 DIAGNOSIS — N183 Chronic kidney disease, stage 3 unspecified: Secondary | ICD-10-CM

## 2023-02-24 DIAGNOSIS — B351 Tinea unguium: Secondary | ICD-10-CM | POA: Diagnosis not present

## 2023-02-24 DIAGNOSIS — E0822 Diabetes mellitus due to underlying condition with diabetic chronic kidney disease: Secondary | ICD-10-CM

## 2023-02-24 DIAGNOSIS — Z794 Long term (current) use of insulin: Secondary | ICD-10-CM

## 2023-02-24 DIAGNOSIS — M79674 Pain in right toe(s): Secondary | ICD-10-CM | POA: Diagnosis not present

## 2023-02-24 DIAGNOSIS — M79675 Pain in left toe(s): Secondary | ICD-10-CM | POA: Diagnosis not present

## 2023-02-24 NOTE — Progress Notes (Signed)
This patient returns to my office for at risk foot care.  This patient requires this care by a professional since this patient will be at risk due to having CKD, and diabetes and CVA.  This patient is unable to cut nails himself since the patient cannot reach his nails.These nails are painful walking and wearing shoes.  This patient presents for at risk foot care today.  General Appearance  Alert, conversant and in no acute stress.  Vascular  Dorsalis pedis and posterior tibial  pulses are palpable  right foot.  Weak  DP and PT left due to CVA.  Capillary return is within normal limits  bilaterally. Temperature is within normal limits  bilaterally.  Venous stasis.  Neurologic  Senn-Weinstein monofilament wire test diminished bilaterally. Muscle power within normal limits bilaterally.  Nails Thick disfigured discolored nails with subungual debris  from hallux to fifth toes bilaterally. No evidence of bacterial infection or drainage bilaterally.  Orthopedic  No limitations of motion  feet .  No crepitus or effusions noted.  HAV  B/L.  Skin  normotropic skin with no porokeratosis noted bilaterally.  No signs of infections or ulcers noted.     Onychomycosis  Pain in right toes  Pain in left toes  Consent was obtained for treatment procedures.   Mechanical debridement of nails 1-5  bilaterally performed with a nail nipper.  Filed with dremel without incident.    Return office visit  4   months                   Told patient to return for periodic foot care and evaluation due to potential at risk complications.   Helane Gunther DPM

## 2023-02-28 ENCOUNTER — Other Ambulatory Visit: Payer: Self-pay | Admitting: Nurse Practitioner

## 2023-02-28 DIAGNOSIS — I482 Chronic atrial fibrillation, unspecified: Secondary | ICD-10-CM

## 2023-03-13 DIAGNOSIS — E119 Type 2 diabetes mellitus without complications: Secondary | ICD-10-CM | POA: Diagnosis not present

## 2023-03-24 ENCOUNTER — Other Ambulatory Visit: Payer: Self-pay | Admitting: Cardiovascular Disease

## 2023-03-24 DIAGNOSIS — I482 Chronic atrial fibrillation, unspecified: Secondary | ICD-10-CM

## 2023-04-13 DIAGNOSIS — E119 Type 2 diabetes mellitus without complications: Secondary | ICD-10-CM | POA: Diagnosis not present

## 2023-04-14 ENCOUNTER — Telehealth: Payer: Self-pay | Admitting: Cardiovascular Disease

## 2023-04-14 ENCOUNTER — Other Ambulatory Visit: Payer: Self-pay | Admitting: Sports Medicine

## 2023-04-14 DIAGNOSIS — I482 Chronic atrial fibrillation, unspecified: Secondary | ICD-10-CM

## 2023-04-14 MED ORDER — APIXABAN 5 MG PO TABS: 5.0000 mg | ORAL_TABLET | Freq: Two times a day (BID) | ORAL | 1 refills | Status: DC

## 2023-04-14 MED ORDER — ISOSORBIDE MONONITRATE ER 30 MG PO TB24: 30.0000 mg | ORAL_TABLET | Freq: Every day | ORAL | 3 refills | Status: AC

## 2023-04-14 NOTE — Telephone Encounter (Signed)
*  STAT* If patient is at the pharmacy, call can be transferred to refill team.   1. Which medications need to be refilled? (please list name of each medication and dose if known)   apixaban (ELIQUIS) 5 MG TABS tablet    2. Which pharmacy/location (including street and city if local pharmacy) is medication to be sent to? [facility] - [place], [place] - [address] Phone: [phone]  Fax: [phone]     3. Do they need a 30 day or 90 day supply? 90

## 2023-04-14 NOTE — Telephone Encounter (Signed)
 Prescription refill request for Eliquis received. Indication: Afib  Last office visit: 01/28/22 ([place])  Scr: 1.00 (11/22/22)  Age: 68 Weight: 87.8kg  Office visit overdue. Pt has scheduled appt with Dr Elease Hashimoto on 04/20/23. Refill sent.

## 2023-04-14 NOTE — Telephone Encounter (Signed)
 Copied from CRM [id number]. Topic: Clinical - Medication Refill >> Apr 14, 2023 10:02 AM Philippa Chester F wrote: Most Recent Primary Care Visit:  Provider: Venita Sheffield  Department: [facility]  Visit Type: OFFICE VISIT  Date: 01/19/2023  Patient's pharmacy called in to refill one of his prescriptions. Lisa calling from [employer]  Medication: isosorbide mononitrate (IMDUR) 30 MG 24 hr tablet  Has the patient contacted their pharmacy? Yes  Is this the correct pharmacy for this prescription? Yes If no, delete pharmacy and type the correct one.  This is the patient's preferred pharmacy:  [facility] - [place], [place] - [address] [place] [address] Phone: [phone] Fax: [phone]     Has the prescription been filled recently? No  Is the patient out of the medication? No  Has the patient been seen for an appointment in the last year OR does the patient have an upcoming appointment? Yes  Can we respond through MyChart? Yes  Agent: Please be advised that Rx refills may take up to 3 business days. We ask that you follow-up with your pharmacy.

## 2023-04-19 ENCOUNTER — Encounter: Payer: Self-pay | Admitting: Cardiovascular Disease

## 2023-04-19 NOTE — Progress Notes (Unsigned)
 Cardiology Office Note   Date:  04/20/2023   ID:  [name], [name] 19, 1955, MRN [id number]  PCP:  Barry Sheffield, MD  Cardiologist:   Barry Miss, MD  Former patient of Dr. Daleen [name]   Chief Complaint  Patient presents with   Hypertension        Atrial Fibrillation   Problem List 1. Atrial fib  2. Chronic diastolic CHF 3. Essential HTN 4. Intracranial hemorrhage ( on coumadin )  5. DM 6. COPD      Previous notes:  [name] is a 68 y.o. male who presents for further evaluation of paroxysmal atrial fibrillation.  He was last seen by Dr. Daleen [name] in Dec. 2014.  No CP recently, no dyspnea  Has better fine motor control in the left side of his body   Able to walk with a walker,  Was seen today in a wheelchair.   March 27, 2016:  Now using a walker ( was in a wheelchair at his last visit )  No CP or dyspnea  BP remains low   July 30, 2017:    Follow up for atrial fib .   Has a history of chronic diastolic congestive heart failure.  Also has a history of hypertension, hyperlipidemia.  Is on PRN lasix - is not on potassium  . Breathing is ok .   Legs are very swollen    Nov. 30,  2020   Here for preoperative evaluation prior to prostate biopsy. Is here in a wheelchair.   Uses a walker at home Had a stroke years ago  Takes his lasix just once a month - is concerned that the Lasix may be contributing to his slight increase in creatinine. He has 1-2+ bilateral ankle edema.  He gets around using a walker so some of this edema may be due to the fact that he is relatively inactive from his stroke.  Nov. 30, 2021: [name] is seen today for follow up of his atrial fib  chronic diastolic CHF Is in Afib today .  Has a stroke in the past, and is now on Eliquis  Hx of HTN and HLD.  Had a stroke,  Uses a walker or wheelchair. No cp or dyspnea.  Able to do his   Dec. 6, 2022 [name] is seen today for follow up of his atrial fib , chronic diastolic CHF , HTN,  HLD  Has hx of stroke,  is on Eliquis  Uses a walker around his house ,  in wheelchair here at the office.   Last lipids from November, 2022 reveals a total cholesterol of 152.  HDL is 50.  LDL is 64.  Triglyceride level is 287. Hb A1C is 7.7    April 20, 2023  [name] is seen for seen for follow up of his atrial fib , chronic diastolic CHF, HTN  Hx of stroke, on eliquis now  Able to use a walker at his home.  Used the rolling chair here at the office  No cp, no dyspnea   Hemoglobin and creatinine look good from December 09, 2022.  We will continue Eliquis 5 mg twice a day. Will renew his Eliquis and also the diltiazem CD1 180 mg a day   Past Medical History:  Diagnosis Date   A-fib (HCC) 11/19/2010   Acute exacerbation of congestive heart failure (HCC) 11/19/2010   Arthritis    CAD (coronary artery disease) 11/20/2010   CHF (congestive heart failure) (HCC)  CKD (chronic kidney disease) stage 3, GFR 30-59 ml/min (HCC) 05/04/2017   Coronary artery disease    Diabetes type 2, controlled (HCC) 11/19/2010   GERD (gastroesophageal reflux disease) 02/08/2015   Gout    Gout 11/20/2010   History of cerebrovascular accident (CVA) with residual deficit 05/04/2017   History of CVA (cerebrovascular accident) 11/19/2010   HTN (hypertension) 07/01/2012   Hyperlipemia 11/21/2010   Hypertension    Hypertensive emergency 11/19/2010   ICH (intracerebral hemorrhage) (HCC) 12/22/2010   Physical deconditioning 12/22/2010   Pulmonary edema 11/19/2010   Respiratory failure (HCC) 11/19/2010   Shortness of breath    Stroke (HCC) 11/19/2010   Thyroiditis 11/20/2010    Past Surgical History:  Procedure Laterality Date   BOWEL DECOMPRESSION N/A 11/19/2022   Procedure: BOWEL DECOMPRESSION;  Surgeon: Lemar Lofty., MD;  Location: [facility] ENDOSCOPY;  Service: Gastroenterology;  Laterality: N/A;   FLEXIBLE SIGMOIDOSCOPY N/A 11/19/2022   Procedure: FLEXIBLE SIGMOIDOSCOPY;  Surgeon: Meridee Score  Netty Starring., MD;  Location: [facility] ENDOSCOPY;  Service: Gastroenterology;  Laterality: N/A;   IR ANGIOGRAM PELVIS SELECTIVE OR SUPRASELECTIVE  08/25/2022   IR ANGIOGRAM SELECTIVE EACH ADDITIONAL VESSEL  08/25/2022   IR ANGIOGRAM SELECTIVE EACH ADDITIONAL VESSEL  08/25/2022   IR EMBO TUMOR ORGAN ISCHEMIA INFARCT INC GUIDE ROADMAPPING  08/25/2022   IR RADIOLOGIST EVAL & MGMT  07/13/2022   IR RADIOLOGIST EVAL & MGMT  09/25/2022   IR US GUIDE VASC ACCESS LEFT  08/25/2022   IR US GUIDE VASC ACCESS LEFT  08/25/2022   IR US GUIDE VASC ACCESS RIGHT  08/25/2022   PEG PLACEMENT  12/03/2010   Procedure: PERCUTANEOUS ENDOSCOPIC GASTROSTOMY (PEG) PLACEMENT;  Surgeon: Hart Carwin, MD;  Location: [facility] ENDOSCOPY;  Service: Endoscopy;  Laterality: N/A;   TRACHEOSTOMY TUBE PLACEMENT  11/28/2010   Procedure: TRACHEOSTOMY;  Surgeon: Susy Frizzle, MD;  Location: [facility] OR;  Service: ENT;  Laterality: N/A;     Current Outpatient Medications  Medication Sig Dispense Refill   acetaminophen (TYLENOL) 500 MG tablet Take 1,000 mg by mouth daily as needed for moderate pain, fever or headache.     Alcohol Swabs (ALCOHOL PREP) 70 % PADS Use to test blood sugar daily. Dx: E11.21 100 each 3   apixaban (ELIQUIS) 5 MG TABS tablet Take 1 tablet (5 mg total) by mouth 2 (two) times daily. 180 tablet 1   blood glucose meter kit and supplies Dispense based on patient and insurance preference. Use up to four times daily as directed. (FOR ICD-10 E10.9, E11.9). 1 each 0   carvedilol (COREG) 25 MG tablet Take 1 tablet (25 mg total) by mouth 2 (two) times daily with a meal. 180 tablet 1   diltiazem (CARDIZEM CD) 180 MG 24 hr capsule TAKE 2 CAPSULES BY MOUTH DAILY AT NOON. GENERIC EQUIVALENT FOR CARDIZEM CD. 180 capsule 3   empagliflozin (JARDIANCE) 10 MG TABS tablet Take 1 tablet (10 mg total) by mouth daily. 90 tablet 1   glucose blood (ONETOUCH ULTRA) test strip 1 each by Other route daily. Dx: E11.21 100 each 11   isosorbide mononitrate (IMDUR)  30 MG 24 hr tablet Take 1 tablet (30 mg total) by mouth daily. 90 tablet 3   metFORMIN (GLUCOPHAGE) 850 MG tablet Take 1 tablet (850 mg total) by mouth daily with breakfast. 90 tablet 1   Multiple Vitamins-Minerals (MENS ONE DAILY PO) Take 1 tablet by mouth daily.     ONETOUCH DELICA LANCETS 33G MISC Use as directed to test blood sugar  once daily 100 each 12   pantoprazole (PROTONIX) 40 MG tablet Take 1 tablet (40 mg total) by mouth daily. 90 tablet 3   pravastatin (PRAVACHOL) 40 MG tablet Take 1 tablet (40 mg total) by mouth at bedtime. 90 tablet 1   spironolactone (ALDACTONE) 25 MG tablet TAKE 1 TABLET BY MOUTH DAILY GENERIC EQUIVALENT FOR ALDACTONE 90 tablet 1   No current facility-administered medications for this visit.    Allergies:   Patient has no known allergies.    Social History:  The patient  reports that he quit smoking about 17 years ago. His smoking use included cigarettes. He has never used smokeless tobacco. He reports that he does not drink alcohol and does not use drugs.   Family History:  The patient's family history includes Diabetes in his brother and mother.    ROS: Noted in current history, otherwise review of systems is negative.   Physical Exam: Blood pressure 126/84, pulse (!) 103, height 5\' 11"  (1.803 m), weight 194 lb 3.2 oz (88.1 kg), SpO2 96%.       GEN:  Well nourished, well developed in no acute distress HEENT: Normal NECK: No JVD; No carotid bruits LYMPHATICS: No lymphadenopathy CARDIAC: irreg. Irreg.   RESPIRATORY:  Clear to auscultation without rales, wheezing or rhonchi  ABDOMEN: Soft, non-tender, non-distended MUSCULOSKELETAL:  No edema; No deformity  SKIN: Warm and dry NEUROLOGIC:  Alert and oriented x 3    EKG:    EKG Interpretation Date/Time:  Tuesday April 20 2023 13:52:18 EDT Ventricular Rate:  63 PR Interval:    QRS Duration:  116 QT Interval:  400 QTC Calculation: 409 R Axis:   -73  Text Interpretation: Atrial  fibrillation Left axis deviation Left ventricular hypertrophy with QRS widening and repolarization abnormality ( R in aVL , Cornell product ) Inferior infarct (cited on or before 08-Feb-2015) When compared with ECG of 08-Feb-2015 12:20, No significant change was found Confirmed by [name] ([id number]) on 04/20/2023 2:03:39 PM     Recent Labs: 08/05/2022: TSH 3.63 11/18/2022: ALT 31 11/20/2022: Hemoglobin 16.0; Platelets 230 12/09/2022: BUN 20; Creat 1.24; Magnesium 2.1; Potassium 4.9; Sodium 136    Lipid Panel    Component Value Date/Time   CHOL 130 08/05/2022 1203   CHOL 181 12/12/2019 1544   TRIG 121 08/05/2022 1203   HDL 56 08/05/2022 1203   HDL 53 12/12/2019 1544   CHOLHDL 2.3 08/05/2022 1203   VLDL 25 02/07/2016 1152   LDLCALC 54 08/05/2022 1203      Wt Readings from Last 3 Encounters:  04/20/23 194 lb 3.2 oz (88.1 kg)  02/24/23 193 lb 6.4 oz (87.7 kg)  01/19/23 193 lb 6.4 oz (87.7 kg)      Other studies Reviewed: Additional studies/ records that were reviewed today include: . Review of the above records demonstrates:    ASSESSMENT AND PLAN:  1. Atrial fib - he is in chronic atrial fibrillation.  CHADS2VASC is 2 ( HTN, diastolic CHF )   Continue eliquis  Dilt 180 CD for rate control      2. Chronic diastolic CHF -  stable      3. Essential HTN -   BP is well controlled.     4. Intracranial hemorrhage   -   5.  Hyperlipidemia:     stable  LDL is 54.     Current medicines are reviewed at length with the patient today.  The patient does not have concerns regarding medicines.  The following  changes have been made:  no change  Labs/ tests ordered today include:   Orders Placed This Encounter  Procedures   EKG 12-Lead     Disposition:        Barry Miss, MD  04/20/2023 2:03 PM    [facility] [address] [place], [place], [place]  [address] Phone: [phone]; Fax: [phone]

## 2023-04-20 ENCOUNTER — Encounter: Payer: Medicare Other | Admitting: Sports Medicine

## 2023-04-20 ENCOUNTER — Ambulatory Visit: Attending: Cardiovascular Disease | Admitting: Cardiovascular Disease

## 2023-04-20 ENCOUNTER — Encounter: Payer: Self-pay | Admitting: Cardiovascular Disease

## 2023-04-20 ENCOUNTER — Telehealth: Payer: Self-pay | Admitting: Cardiovascular Disease

## 2023-04-20 VITALS — BP 126/84 | HR 103 | Ht 71.0 in | Wt 194.2 lb

## 2023-04-20 DIAGNOSIS — I482 Chronic atrial fibrillation, unspecified: Secondary | ICD-10-CM

## 2023-04-20 DIAGNOSIS — I10 Essential (primary) hypertension: Secondary | ICD-10-CM

## 2023-04-20 DIAGNOSIS — I4821 Permanent atrial fibrillation: Secondary | ICD-10-CM | POA: Diagnosis not present

## 2023-04-20 DIAGNOSIS — I2510 Atherosclerotic heart disease of native coronary artery without angina pectoris: Secondary | ICD-10-CM | POA: Diagnosis not present

## 2023-04-20 DIAGNOSIS — E785 Hyperlipidemia, unspecified: Secondary | ICD-10-CM | POA: Diagnosis not present

## 2023-04-20 MED ORDER — APIXABAN 5 MG PO TABS: 5.0000 mg | ORAL_TABLET | Freq: Two times a day (BID) | ORAL | 3 refills | Status: DC

## 2023-04-20 MED ORDER — DILTIAZEM HCL ER COATED BEADS 180 MG PO CP24: ORAL_CAPSULE | ORAL | 3 refills | Status: AC

## 2023-04-20 MED ORDER — DILTIAZEM HCL ER COATED BEADS 180 MG PO CP24: ORAL_CAPSULE | ORAL | 3 refills | Status: DC

## 2023-04-20 NOTE — Telephone Encounter (Signed)
 Pt's medication was sent to pt's pharmacy as requested. Confirmation received.

## 2023-04-20 NOTE — Telephone Encounter (Signed)
*  STAT* If patient is at the pharmacy, call can be transferred to refill team.   1. Which medications need to be refilled? (please list name of each medication and dose if known) Diltiazem   2. Would you like to learn more about the convenience, safety, & potential cost savings by using the [facility]?     3. Are you open to using the [facility] (Type [facility].    4. Which pharmacy/location (including street and city if local pharmacy) is medication to be sent to? [facility]   5. Do they need a 30 day or 90 day supply? 90 days and refill- patient has an appointment today(04-20-23) with Dr Elease Hashimoto

## 2023-04-20 NOTE — Patient Instructions (Signed)
 Medication Instructions:  Your physician recommends that you continue on your current medications as directed. Please refer to the Current Medication list given to you today.  Refill for Diltiazem (Cardizem) and Eliquis have been sent to your pharmacy.  *If you need a refill on your cardiac medications before your next appointment, please call your pharmacy*  Lab Work: None ordered today. If you have labs (blood work) drawn today and your tests are completely normal, you will receive your results only by: MyChart Message (if you have MyChart) OR A paper copy in the mail If you have any lab test that is abnormal or we need to change your treatment, we will call you to review the results.  Testing/Procedures: None ordered today.  Follow-Up: At [facility], you and your health needs are our priority.  As part of our continuing mission to provide you with exceptional heart care, we have created designated Provider Care Teams.  These Care Teams include your primary Cardiologist (physician) and Advanced Practice Providers (APPs -  Physician Assistants and Nurse Practitioners) who all work together to provide you with the care you need, when you need it.  We recommend signing up for the patient portal called "MyChart".  Sign up information is provided on this After Visit Summary.  MyChart is used to connect with patients for Virtual Visits (Telemedicine).  Patients are able to view lab/test results, encounter notes, upcoming appointments, etc.  Non-urgent messages can be sent to your provider as well.   To learn more about what you can do with MyChart, go to [email or web address].    Your next appointment:   1 year(s)  The format for your next appointment:   In Person  Provider:   Available Provider  {  Other Instructions   1st Floor: - Lobby - Registration  - Pharmacy  - Lab - Cafe  2nd Floor: - PV Lab - Diagnostic Testing (echo, CT, nuclear med)  3rd Floor: -  Vacant  4th Floor: - TCTS (cardiothoracic surgery) - AFib Clinic - Structural Heart Clinic - Vascular Surgery  - Vascular Ultrasound  5th Floor: - [facility]Care Cardiology (general and EP) - Clinical Pharmacy for coumadin, hypertension, lipid, weight-loss medications, and med management appointments    Valet parking services will be available as well.

## 2023-04-21 NOTE — Progress Notes (Signed)
 This encounter was created in error - please disregard.

## 2023-05-13 DIAGNOSIS — E119 Type 2 diabetes mellitus without complications: Secondary | ICD-10-CM | POA: Diagnosis not present

## 2023-05-22 ENCOUNTER — Other Ambulatory Visit: Payer: Self-pay | Admitting: Sports Medicine

## 2023-06-06 ENCOUNTER — Other Ambulatory Visit: Payer: Self-pay | Admitting: Sports Medicine

## 2023-06-13 DIAGNOSIS — E119 Type 2 diabetes mellitus without complications: Secondary | ICD-10-CM | POA: Diagnosis not present

## 2023-07-13 DIAGNOSIS — E119 Type 2 diabetes mellitus without complications: Secondary | ICD-10-CM | POA: Diagnosis not present

## 2023-08-05 ENCOUNTER — Encounter: Payer: Self-pay | Admitting: Podiatry

## 2023-08-05 ENCOUNTER — Ambulatory Visit: Admitting: Podiatry

## 2023-08-05 DIAGNOSIS — M79675 Pain in left toe(s): Secondary | ICD-10-CM | POA: Diagnosis not present

## 2023-08-05 DIAGNOSIS — N183 Chronic kidney disease, stage 3 unspecified: Secondary | ICD-10-CM

## 2023-08-05 DIAGNOSIS — B351 Tinea unguium: Secondary | ICD-10-CM | POA: Diagnosis not present

## 2023-08-05 DIAGNOSIS — M79674 Pain in right toe(s): Secondary | ICD-10-CM | POA: Diagnosis not present

## 2023-08-05 DIAGNOSIS — E0822 Diabetes mellitus due to underlying condition with diabetic chronic kidney disease: Secondary | ICD-10-CM | POA: Diagnosis not present

## 2023-08-05 DIAGNOSIS — Z794 Long term (current) use of insulin: Secondary | ICD-10-CM

## 2023-08-05 NOTE — Progress Notes (Signed)
 This patient returns to my office for at risk foot care.  This patient requires this care by a professional since this patient will be at risk due to having CKD, and diabetes and CVA.  This patient is unable to cut nails himself since the patient cannot reach his nails.These nails are painful walking and wearing shoes.  This patient presents for at risk foot care today.  General Appearance  Alert, conversant and in no acute stress.  Vascular  Dorsalis pedis and posterior tibial  pulses are palpable  right foot.  Weak  DP and PT left due to CVA.  Capillary return is within normal limits  bilaterally. Temperature is within normal limits  bilaterally.  Venous stasis.  Neurologic  Senn-Weinstein monofilament wire test diminished bilaterally. Muscle power within normal limits bilaterally.  Nails Thick disfigured discolored nails with subungual debris  from hallux to fifth toes bilaterally. No evidence of bacterial infection or drainage bilaterally.  Orthopedic  No limitations of motion  feet .  No crepitus or effusions noted.  HAV  B/L.  Skin  normotropic skin with no porokeratosis noted bilaterally.  No signs of infections or ulcers noted.     Onychomycosis  Pain in right toes  Pain in left toes  Consent was obtained for treatment procedures.   Mechanical debridement of nails 1-5  bilaterally performed with a nail nipper.  Filed with dremel without incident.    Return office visit  4   months                   Told patient to return for periodic foot care and evaluation due to potential at risk complications.   Helane Gunther DPM

## 2023-08-06 ENCOUNTER — Other Ambulatory Visit: Payer: Self-pay | Admitting: Sports Medicine

## 2023-08-06 DIAGNOSIS — I2510 Atherosclerotic heart disease of native coronary artery without angina pectoris: Secondary | ICD-10-CM

## 2023-08-06 DIAGNOSIS — I10 Essential (primary) hypertension: Secondary | ICD-10-CM

## 2023-08-13 ENCOUNTER — Other Ambulatory Visit: Payer: Self-pay | Admitting: Sports Medicine

## 2023-08-13 DIAGNOSIS — E119 Type 2 diabetes mellitus without complications: Secondary | ICD-10-CM | POA: Diagnosis not present

## 2023-08-28 ENCOUNTER — Other Ambulatory Visit: Payer: Self-pay | Admitting: Sports Medicine

## 2023-09-13 DIAGNOSIS — E119 Type 2 diabetes mellitus without complications: Secondary | ICD-10-CM | POA: Diagnosis not present

## 2023-09-21 ENCOUNTER — Encounter: Payer: Self-pay | Admitting: Sports Medicine

## 2023-09-21 ENCOUNTER — Ambulatory Visit: Admitting: Sports Medicine

## 2023-09-21 VITALS — BP 128/76 | HR 60 | Temp 96.6°F | Resp 18 | Ht 71.0 in | Wt 196.2 lb

## 2023-09-21 DIAGNOSIS — I5032 Chronic diastolic (congestive) heart failure: Secondary | ICD-10-CM | POA: Diagnosis not present

## 2023-09-21 DIAGNOSIS — N1831 Chronic kidney disease, stage 3a: Secondary | ICD-10-CM

## 2023-09-21 DIAGNOSIS — Z794 Long term (current) use of insulin: Secondary | ICD-10-CM

## 2023-09-21 DIAGNOSIS — I300 Acute nonspecific idiopathic pericarditis: Secondary | ICD-10-CM

## 2023-09-21 DIAGNOSIS — E0822 Diabetes mellitus due to underlying condition with diabetic chronic kidney disease: Secondary | ICD-10-CM

## 2023-09-21 DIAGNOSIS — E1122 Type 2 diabetes mellitus with diabetic chronic kidney disease: Secondary | ICD-10-CM | POA: Diagnosis not present

## 2023-09-21 DIAGNOSIS — I10 Essential (primary) hypertension: Secondary | ICD-10-CM

## 2023-09-21 DIAGNOSIS — N183 Chronic kidney disease, stage 3 unspecified: Secondary | ICD-10-CM | POA: Diagnosis not present

## 2023-09-21 DIAGNOSIS — Z23 Encounter for immunization: Secondary | ICD-10-CM | POA: Diagnosis not present

## 2023-09-21 MED ORDER — METFORMIN HCL 850 MG PO TABS: 850.0000 mg | ORAL_TABLET | Freq: Every day | ORAL | 3 refills | Status: AC

## 2023-09-21 NOTE — Progress Notes (Signed)
 Careteam: Patient Care Team: Sherlynn Madden, MD as PCP - General (Internal Medicine) Nahser, Aleene PARAS, MD (Inactive) as PCP - Cardiology (Cardiology) Loetta Nottingham, MD (Endocrinology) Darina Shasta HERO, CPhT as [facility] Care Management (Pharmacy Technician)  PLACE OF SERVICE:  [facility]  Advanced Directive information    No Known Allergies  Chief Complaint  Patient presents with   Medication Management    Medication     Discussed the use of AI scribe software for clinical note transcription with the patient, who gave verbal consent to proceed.  History of Present Illness [name] is a 68 year old male with diabetes and hypertension who presents for a routine follow-up visit.  He has been stable since his last visit in January, living independently with the aid of a walker at home and a wheelchair for longer distances. He manages basic cooking and cleaning himself, with assistance from a daycare service every other Thursday.  He monitors his blood glucose levels every morning, with readings typically around 110 mg/dL, and has not experienced any hypoglycemic episodes. His last eye exam was in January. He is currently taking metformin  850 mg once daily, along with Coreg , Cardizem , Indur, spironolactone , Pravachol , Jardiance  10 mg, and Eliquis  5 mg twice daily. He requires a refill for metformin . He recalls being previously on losartan  in the past.  No recent falls, breathing problems, cough, congestion, heartburn, acid reflux, or blood in urine or stool. He reports regular bowel movements and denies joint pain or recent mood changes.  For physical activity, he uses an exercise bike, performing three sets of 100 repetitions. He does not have any additional questions or concerns at this time.     Review of Systems:  Review of Systems  Constitutional:  Negative for chills and fever.  HENT:  Negative for congestion and sore throat.   Respiratory:   Negative for cough, sputum production and shortness of breath.   Cardiovascular:  Negative for chest pain, palpitations and leg swelling.  Gastrointestinal:  Negative for abdominal pain, heartburn and nausea.  Genitourinary:  Negative for dysuria, frequency and hematuria.  Musculoskeletal:  Negative for falls and myalgias.  Neurological:  Negative for dizziness.   Negative unless indicated in HPI.   Past Medical History:  Diagnosis Date   A-fib (HCC) 11/19/2010   Acute exacerbation of congestive heart failure (HCC) 11/19/2010   Arthritis    CAD (coronary artery disease) 11/20/2010   CHF (congestive heart failure) (HCC)    CKD (chronic kidney disease) stage 3, GFR 30-59 ml/min (HCC) 05/04/2017   Coronary artery disease    Diabetes type 2, controlled (HCC) 11/19/2010   GERD (gastroesophageal reflux disease) 02/08/2015   Gout    Gout 11/20/2010   History of cerebrovascular accident (CVA) with residual deficit 05/04/2017   History of CVA (cerebrovascular accident) 11/19/2010   HTN (hypertension) 07/01/2012   Hyperlipemia 11/21/2010   Hypertension    Hypertensive emergency 11/19/2010   ICH (intracerebral hemorrhage) (HCC) 12/22/2010   Physical deconditioning 12/22/2010   Pulmonary edema 11/19/2010   Respiratory failure (HCC) 11/19/2010   Shortness of breath    Stroke (HCC) 11/19/2010   Thyroiditis 11/20/2010   Past Surgical History:  Procedure Laterality Date   BOWEL DECOMPRESSION N/A 11/19/2022   Procedure: BOWEL DECOMPRESSION;  Surgeon: Wilhelmenia Aloha Raddle., MD;  Location: [facility] ENDOSCOPY;  Service: Gastroenterology;  Laterality: N/A;   FLEXIBLE SIGMOIDOSCOPY N/A 11/19/2022   Procedure: FLEXIBLE SIGMOIDOSCOPY;  Surgeon: Wilhelmenia Aloha Raddle., MD;  Location: [facility] ENDOSCOPY;  Service: Gastroenterology;  Laterality: N/A;   IR ANGIOGRAM PELVIS SELECTIVE OR SUPRASELECTIVE  08/25/2022   IR ANGIOGRAM SELECTIVE EACH ADDITIONAL VESSEL  08/25/2022   IR ANGIOGRAM SELECTIVE EACH ADDITIONAL  VESSEL  08/25/2022   IR EMBO TUMOR ORGAN ISCHEMIA INFARCT INC GUIDE ROADMAPPING  08/25/2022   IR RADIOLOGIST EVAL & MGMT  07/13/2022   IR RADIOLOGIST EVAL & MGMT  09/25/2022   IR US  GUIDE VASC ACCESS LEFT  08/25/2022   IR US  GUIDE VASC ACCESS LEFT  08/25/2022   IR US  GUIDE VASC ACCESS RIGHT  08/25/2022   PEG PLACEMENT  12/03/2010   Procedure: PERCUTANEOUS ENDOSCOPIC GASTROSTOMY (PEG) PLACEMENT;  Surgeon: Princella CHRISTELLA Nida, MD;  Location: [facility] ENDOSCOPY;  Service: Endoscopy;  Laterality: N/A;   TRACHEOSTOMY TUBE PLACEMENT  11/28/2010   Procedure: TRACHEOSTOMY;  Surgeon: Ida VEAR Loader, MD;  Location: [facility] OR;  Service: ENT;  Laterality: N/A;   Social History:   reports that he quit smoking about 17 years ago. His smoking use included cigarettes. He has never used smokeless tobacco. He reports that he does not drink alcohol  and does not use drugs.  Family History  Problem Relation Age of Onset   Diabetes Mother    Diabetes Brother    Colon cancer Neg Hx    Colon polyps Neg Hx    Kidney disease Neg Hx    Esophageal cancer Neg Hx    Heart disease Neg Hx    Gallbladder disease Neg Hx     Medications: Patient's Medications  New Prescriptions   No medications on file  Previous Medications   ACETAMINOPHEN  (TYLENOL ) 500 MG TABLET    Take 1,000 mg by mouth daily as needed for moderate pain, fever or headache.   ALCOHOL  SWABS (ALCOHOL  PREP) 70 % PADS    Use to test blood sugar daily. Dx: E11.21   APIXABAN  (ELIQUIS ) 5 MG TABS TABLET    Take 1 tablet (5 mg total) by mouth 2 (two) times daily.   BLOOD GLUCOSE METER KIT AND SUPPLIES    Dispense based on patient and insurance preference. Use up to four times daily as directed. (FOR ICD-10 E10.9, E11.9).   CARVEDILOL  (COREG ) 25 MG TABLET    TAKE 1 TABLET BY MOUTH 2 TIMES DAILY WITH A MEAL   DILTIAZEM  (CARDIZEM  CD) 180 MG 24 HR CAPSULE    TAKE 2 CAPSULES BY MOUTH DAILY AT NOON. GENERIC EQUIVALENT FOR CARDIZEM  CD.   GLUCOSE BLOOD (ONETOUCH ULTRA) TEST STRIP    1  each by Other route daily. Dx: E11.21   ISOSORBIDE  MONONITRATE (IMDUR ) 30 MG 24 HR TABLET    Take 1 tablet (30 mg total) by mouth daily.   JARDIANCE  10 MG TABS TABLET    TAKE 1 TABLET BY MOUTH DAILY. NEEDS AN APPOINTMENT BEFORE ANYMORE FUTURE REFILLS   METFORMIN  (GLUCOPHAGE ) 850 MG TABLET    Take 1 tablet (850 mg total) by mouth daily with breakfast. PATIENT NEEDS APPOINTMENT!!   MULTIPLE VITAMINS-MINERALS (MENS ONE DAILY PO)    Take 1 tablet by mouth daily.   ONETOUCH DELICA LANCETS 33G MISC    Use as directed to test blood sugar once daily   PANTOPRAZOLE  (PROTONIX ) 40 MG TABLET    Take 1 tablet (40 mg total) by mouth daily.   PRAVASTATIN  (PRAVACHOL ) 40 MG TABLET    TAKE 1 TABLET BY MOUTH AT BEDTIME. NEEDS AN APPOINTMENT BEFORE ANYMORE FUTURE REFILLS   SPIRONOLACTONE  (ALDACTONE ) 25 MG TABLET    TAKE 1 TABLET BY MOUTH DAILY  Modified Medications   No medications on file  Discontinued Medications   No medications on file    Physical Exam: There were no vitals filed for this visit. There is no height or weight on file to calculate BMI. BP Readings from Last 3 Encounters:  04/20/23 126/84  01/19/23 (!) 122/100  12/09/22 132/78   Wt Readings from Last 3 Encounters:  04/20/23 194 lb 3.2 oz (88.1 kg)  02/24/23 193 lb 6.4 oz (87.7 kg)  01/19/23 193 lb 6.4 oz (87.7 kg)    Physical Exam Constitutional:      Appearance: Normal appearance.  HENT:     Head: Normocephalic and atraumatic.  Cardiovascular:     Rate and Rhythm: Normal rate and regular rhythm.     Pulses: Normal pulses.     Heart sounds: Normal heart sounds.  Pulmonary:     Effort: No respiratory distress.     Breath sounds: No stridor. No wheezing or rales.  Abdominal:     General: Bowel sounds are normal. There is no distension.     Palpations: Abdomen is soft.     Tenderness: There is no abdominal tenderness. There is no guarding.  Musculoskeletal:        General: No swelling.  Neurological:     Mental Status: He is  alert. Mental status is at baseline.     Motor: No weakness.     Labs reviewed: Basic Metabolic Panel: Recent Labs    11/21/22 0609 11/21/22 1601 11/22/22 0606 12/09/22 1330  NA 136  --  138 136  K 2.5* 2.5* 3.7 4.9  CL 105  --  112* 100  CO2 22  --  21* 27  GLUCOSE 133*  --  161* 159*  BUN 15  --  14 20  CREATININE 1.21  --  1.00 1.24  CALCIUM  8.7*  --  9.0 10.1  MG 2.0  --  1.8 2.1  PHOS 2.5  --  2.4* 4.0   Liver Function Tests: Recent Labs    11/18/22 1718  AST 24  ALT 31  ALKPHOS 55  BILITOT 0.9  PROT 7.3  ALBUMIN 3.9   Recent Labs    11/18/22 1718  LIPASE 26   No results for input(s): AMMONIA in the last 8760 hours. CBC: Recent Labs    11/18/22 1718 11/19/22 0007 11/20/22 0637  WBC 6.8 7.3 5.8  NEUTROABS  --  4.3  --   HGB 15.4 15.3 16.0  HCT 47.0 46.5 47.1  MCV 90.2 90.3 89.0  PLT 263 239 230   Lipid Panel: No results for input(s): CHOL, HDL, LDLCALC, TRIG, CHOLHDL, LDLDIRECT in the last 8760 hours. TSH: No results for input(s): TSH in the last 8760 hours. A1C: Lab Results  Component Value Date   HGBA1C 6.9 (H) 08/05/2022    Assessment and Plan Assessment & Plan   1. Diabetes mellitus due to underlying condition with stage 3 chronic kidney disease, with long-term current use of insulin , unspecified whether stage 3a or 3b CKD (HCC)  Will check labs Refilled metformin   Will restart ACEI after the blood work  - Urine Albumin/Creatinine with ratio (send out) [LAB689] - Hemoglobin A1c - metFORMIN  (GLUCOPHAGE ) 850 MG tablet; Take 1 tablet (850 mg total) by mouth daily with breakfast. PATIENT NEEDS APPOINTMENT!!  Dispense: 90 tablet; Refill: 3 - CBC (no diff) - Lipid Panel  2. Immunization due (Primary)  - Flu vaccine HIGH DOSE PF(Fluzone Trivalent)  3. Primary hypertension At goal  Avoid salty  foods  4. Stage 3a chronic kidney disease (HCC) Void nephrotoxic meds  - CBC (no diff) - Basic Metabolic Panel  5.  Chronic diastolic CHF (congestive heart failure) (HCC) Euvolemic  Avoid salty foods

## 2023-09-21 NOTE — Patient Instructions (Addendum)
 1.) Our records indicate you are due for an annual wellness visit, stop at check out to schedule (video or in-person).

## 2023-09-22 ENCOUNTER — Ambulatory Visit: Payer: Self-pay | Admitting: Sports Medicine

## 2023-09-22 ENCOUNTER — Other Ambulatory Visit: Payer: Self-pay | Admitting: Sports Medicine

## 2023-09-22 DIAGNOSIS — Z794 Long term (current) use of insulin: Secondary | ICD-10-CM

## 2023-09-22 LAB — BASIC METABOLIC PANEL WITH GFR
BUN: 24 mg/dL (ref 7–25)
CO2: 27 mmol/L (ref 20–32)
Calcium: 10 mg/dL (ref 8.6–10.3)
Chloride: 99 mmol/L (ref 98–110)
Creat: 1.35 mg/dL (ref 0.70–1.35)
Glucose, Bld: 136 mg/dL (ref 65–139)
Potassium: 4.8 mmol/L (ref 3.5–5.3)
Sodium: 134 mmol/L — ABNORMAL LOW (ref 135–146)
eGFR: 57 mL/min/1.73m2 — ABNORMAL LOW (ref >=60)

## 2023-09-22 LAB — LIPID PANEL
Cholesterol: 115 mg/dL (ref <200)
HDL: 51 mg/dL (ref >=40)
LDL Cholesterol (Calc): 46 mg/dL
Non-HDL Cholesterol (Calc): 64 mg/dL (ref <130)
Total CHOL/HDL Ratio: 2.3 (calc) (ref <5.0)
Triglycerides: 96 mg/dL (ref <150)

## 2023-09-22 LAB — CBC
HCT: 43.4 % (ref 38.5–50.0)
Hemoglobin: 14.4 g/dL (ref 13.2–17.1)
MCH: 30.5 pg (ref 27.0–33.0)
MCHC: 33.2 g/dL (ref 32.0–36.0)
MCV: 91.9 fL (ref 80.0–100.0)
MPV: 10.3 fL (ref 7.5–12.5)
Platelets: 224 Thousand/uL (ref 140–400)
RBC: 4.72 Million/uL (ref 4.20–5.80)
RDW: 14 % (ref 11.0–15.0)
WBC: 4 Thousand/uL (ref 3.8–10.8)

## 2023-09-22 LAB — HEMOGLOBIN A1C
Hgb A1c MFr Bld: 7 % — ABNORMAL HIGH (ref <5.7)
Mean Plasma Glucose: 154 mg/dL
eAG (mmol/L): 8.5 mmol/L

## 2023-09-22 LAB — MICROALBUMIN / CREATININE URINE RATIO
Creatinine, Urine: 70 mg/dL (ref 20–320)
Microalb, Ur: <0.2 mg/dL

## 2023-09-29 ENCOUNTER — Telehealth: Payer: Self-pay | Admitting: Cardiovascular Disease

## 2023-09-29 DIAGNOSIS — I482 Chronic atrial fibrillation, unspecified: Secondary | ICD-10-CM

## 2023-09-29 MED ORDER — APIXABAN 5 MG PO TABS: 5.0000 mg | ORAL_TABLET | Freq: Two times a day (BID) | ORAL | 1 refills | Status: AC

## 2023-09-29 NOTE — Telephone Encounter (Signed)
 Pt last saw Dr Alveta 04/20/23, last labs 09/21/23 Creat 1.35, age 68, weight 89kg, based on specified criteria pt is on appropriate dosage of Eliquis  5mg  BID for afib.  Will refill rx.

## 2023-09-29 NOTE — Telephone Encounter (Signed)
*  STAT* If patient is at the pharmacy, call can be transferred to refill team.   1. Which medications need to be refilled? (please list name of each medication and dose if known) apixaban (ELIQUIS) 5 MG TABS tablet   2. Which pharmacy/location (including street and city if local pharmacy) is medication to be sent to? [facility] - [place], [place] - [address]   3. Do they need a 30 day or 90 day supply? 90

## 2023-10-13 DIAGNOSIS — E119 Type 2 diabetes mellitus without complications: Secondary | ICD-10-CM | POA: Diagnosis not present

## 2023-10-26 DIAGNOSIS — N509 Disorder of male genital organs, unspecified: Secondary | ICD-10-CM | POA: Diagnosis not present

## 2023-10-26 DIAGNOSIS — R972 Elevated prostate specific antigen [PSA]: Secondary | ICD-10-CM | POA: Diagnosis not present

## 2023-10-26 DIAGNOSIS — R351 Nocturia: Secondary | ICD-10-CM | POA: Diagnosis not present

## 2023-10-26 DIAGNOSIS — N401 Enlarged prostate with lower urinary tract symptoms: Secondary | ICD-10-CM | POA: Diagnosis not present

## 2023-11-08 ENCOUNTER — Other Ambulatory Visit: Payer: Self-pay | Admitting: Sports Medicine

## 2023-11-13 DIAGNOSIS — E119 Type 2 diabetes mellitus without complications: Secondary | ICD-10-CM | POA: Diagnosis not present

## 2023-11-19 ENCOUNTER — Other Ambulatory Visit: Payer: Self-pay | Admitting: Sports Medicine

## 2023-12-04 ENCOUNTER — Other Ambulatory Visit: Payer: Self-pay | Admitting: Sports Medicine

## 2023-12-04 DIAGNOSIS — K2100 Gastro-esophageal reflux disease with esophagitis, without bleeding: Secondary | ICD-10-CM

## 2023-12-06 ENCOUNTER — Telehealth: Payer: Self-pay

## 2023-12-06 DIAGNOSIS — K2100 Gastro-esophageal reflux disease with esophagitis, without bleeding: Secondary | ICD-10-CM

## 2023-12-06 MED ORDER — PANTOPRAZOLE SODIUM 40 MG PO TBEC: 40.0000 mg | DELAYED_RELEASE_TABLET | Freq: Every day | ORAL | 0 refills | Status: AC

## 2023-12-06 NOTE — Telephone Encounter (Signed)
 I called patient and informed him that he needs to schedule a follow-up with one of our NP's as Dr.Veludandi is no longer at this office. Patient was instructed to schedule a follow-up for January. Patient declined to schedule with one of the other providers stating he does not know when he would be available to come in. I explained that we would be scheduling for over 1 month out and he could always reschedule if needed, however he still declined to schedule.  I informed patient that his actions may results in providers only supplying a 30 day supply of medication with no refills and patient stated  That's fine

## 2023-12-06 NOTE — Telephone Encounter (Signed)
 Copied from CRM #[id number]. Topic: Clinical - Medication Question >> Dec 06, 2023  1:02 PM Suzette B wrote: Reason for RMF:Zounw [place] patient called from [phone] in regards to his 40mg  Pantropazole prescribed by Dr. Stana, I attempted to explain to the patient that the refill had been sent by the mail order pharmacy and is pending. He was concerned he was denied the refill I advised him on the time slots for the refills and explain that the weekend is not considered business days, that he was not denied the medication, but pending. Please call and advised patient.

## 2023-12-06 NOTE — Telephone Encounter (Signed)
 1 month sent to pharmacy but will not send any additional medication in until an appt has been made and establish care with another provider.  He is due for follow up in January

## 2023-12-30 ENCOUNTER — Other Ambulatory Visit: Payer: Self-pay | Admitting: Sports Medicine

## 2024-01-21 ENCOUNTER — Other Ambulatory Visit: Payer: Self-pay | Admitting: Sports Medicine

## 2024-01-21 DIAGNOSIS — I2510 Atherosclerotic heart disease of native coronary artery without angina pectoris: Secondary | ICD-10-CM

## 2024-01-21 DIAGNOSIS — I10 Essential (primary) hypertension: Secondary | ICD-10-CM

## 2024-01-21 NOTE — Telephone Encounter (Signed)
 Patient states he will be going to [facility] to see provider for medications on Monday. He is completely out of the medications and would like to know would you be willing send until his appointment? Please advise

## 2024-01-27 ENCOUNTER — Other Ambulatory Visit: Payer: Self-pay | Admitting: Sports Medicine

## 2024-01-27 NOTE — Telephone Encounter (Signed)
 Patient reports he will not be following or staying with [facility] he will be seeing new provider that will refill medications.

## 2024-01-30 ENCOUNTER — Other Ambulatory Visit: Payer: Self-pay | Admitting: Sports Medicine

## 2024-01-30 DIAGNOSIS — I10 Essential (primary) hypertension: Secondary | ICD-10-CM

## 2024-01-30 DIAGNOSIS — I2510 Atherosclerotic heart disease of native coronary artery without angina pectoris: Secondary | ICD-10-CM

## 2024-02-14 ENCOUNTER — Other Ambulatory Visit: Payer: Self-pay | Admitting: Sports Medicine

## 2024-02-14 DIAGNOSIS — I10 Essential (primary) hypertension: Secondary | ICD-10-CM
